# Patient Record
Sex: Female | Born: 1950
Health system: Southern US, Community
[De-identification: ages and names within clinical notes are randomized; demographics above are authoritative.]

## PROBLEM LIST (undated history)

## (undated) ENCOUNTER — Emergency Department (HOSPITAL_COMMUNITY): Payer: Medicare HMO

## (undated) DIAGNOSIS — C801 Malignant (primary) neoplasm, unspecified: Secondary | ICD-10-CM

## (undated) DIAGNOSIS — J189 Pneumonia, unspecified organism: Secondary | ICD-10-CM

## (undated) DIAGNOSIS — H269 Unspecified cataract: Secondary | ICD-10-CM

## (undated) DIAGNOSIS — J45909 Unspecified asthma, uncomplicated: Secondary | ICD-10-CM

## (undated) DIAGNOSIS — I10 Essential (primary) hypertension: Secondary | ICD-10-CM

## (undated) DIAGNOSIS — A419 Sepsis, unspecified organism: Secondary | ICD-10-CM

## (undated) DIAGNOSIS — I82409 Acute embolism and thrombosis of unspecified deep veins of unspecified lower extremity: Secondary | ICD-10-CM

## (undated) DIAGNOSIS — E119 Type 2 diabetes mellitus without complications: Secondary | ICD-10-CM

## (undated) DIAGNOSIS — G5600 Carpal tunnel syndrome, unspecified upper limb: Secondary | ICD-10-CM

## (undated) DIAGNOSIS — I2699 Other pulmonary embolism without acute cor pulmonale: Secondary | ICD-10-CM

## (undated) HISTORY — DX: Sepsis, unspecified organism: A41.9

## (undated) HISTORY — PX: INTRAOCULAR LENS INSERTION: SHX110

## (undated) HISTORY — DX: Unspecified cataract: H26.9

## (undated) HISTORY — DX: Type 2 diabetes mellitus without complications: E11.9

## (undated) HISTORY — PX: ABDOMINAL HYSTERECTOMY: SHX81

## (undated) HISTORY — DX: Acute embolism and thrombosis of unspecified deep veins of unspecified lower extremity: I82.409

## (undated) HISTORY — PX: OTHER SURGICAL HISTORY: SHX169

## (undated) HISTORY — PX: FOOT SURGERY: SHX648

---

## 2001-03-04 ENCOUNTER — Ambulatory Visit (HOSPITAL_COMMUNITY): Admission: RE | Admit: 2001-03-04 | Discharge: 2001-03-04 | Payer: Self-pay | Admitting: Internal Medicine

## 2001-03-04 ENCOUNTER — Ambulatory Visit (HOSPITAL_COMMUNITY): Admission: RE | Admit: 2001-03-04 | Discharge: 2001-03-04 | Payer: Self-pay | Admitting: Otolaryngology

## 2001-03-04 ENCOUNTER — Encounter: Payer: Self-pay | Admitting: Otolaryngology

## 2001-03-04 ENCOUNTER — Encounter: Payer: Self-pay | Admitting: Internal Medicine

## 2001-03-28 ENCOUNTER — Ambulatory Visit (HOSPITAL_COMMUNITY): Admission: RE | Admit: 2001-03-28 | Discharge: 2001-03-28 | Payer: Self-pay | Admitting: Otolaryngology

## 2001-03-28 ENCOUNTER — Encounter: Payer: Self-pay | Admitting: Otolaryngology

## 2001-09-09 ENCOUNTER — Encounter: Payer: Self-pay | Admitting: Internal Medicine

## 2001-09-09 ENCOUNTER — Ambulatory Visit (HOSPITAL_COMMUNITY): Admission: RE | Admit: 2001-09-09 | Discharge: 2001-09-09 | Payer: Self-pay | Admitting: Internal Medicine

## 2001-09-19 ENCOUNTER — Ambulatory Visit (HOSPITAL_COMMUNITY): Admission: RE | Admit: 2001-09-19 | Discharge: 2001-09-19 | Payer: Self-pay | Admitting: General Surgery

## 2001-10-03 ENCOUNTER — Ambulatory Visit (HOSPITAL_COMMUNITY): Admission: RE | Admit: 2001-10-03 | Discharge: 2001-10-03 | Payer: Self-pay | Admitting: General Surgery

## 2002-06-16 ENCOUNTER — Ambulatory Visit (HOSPITAL_COMMUNITY): Admission: RE | Admit: 2002-06-16 | Discharge: 2002-06-16 | Payer: Self-pay | Admitting: Internal Medicine

## 2002-06-16 ENCOUNTER — Encounter: Payer: Self-pay | Admitting: Internal Medicine

## 2002-06-25 ENCOUNTER — Encounter: Payer: Self-pay | Admitting: Internal Medicine

## 2002-06-25 ENCOUNTER — Ambulatory Visit (HOSPITAL_COMMUNITY): Admission: RE | Admit: 2002-06-25 | Discharge: 2002-06-25 | Payer: Self-pay | Admitting: Internal Medicine

## 2003-06-21 ENCOUNTER — Ambulatory Visit (HOSPITAL_COMMUNITY): Admission: RE | Admit: 2003-06-21 | Discharge: 2003-06-21 | Payer: Self-pay | Admitting: Internal Medicine

## 2004-06-23 ENCOUNTER — Ambulatory Visit (HOSPITAL_COMMUNITY): Admission: RE | Admit: 2004-06-23 | Discharge: 2004-06-23 | Payer: Self-pay | Admitting: Internal Medicine

## 2005-06-25 ENCOUNTER — Ambulatory Visit (HOSPITAL_COMMUNITY): Admission: RE | Admit: 2005-06-25 | Discharge: 2005-06-25 | Payer: Self-pay | Admitting: Internal Medicine

## 2006-06-06 ENCOUNTER — Ambulatory Visit (HOSPITAL_COMMUNITY): Admission: RE | Admit: 2006-06-06 | Discharge: 2006-06-06 | Payer: Self-pay | Admitting: Internal Medicine

## 2007-04-11 ENCOUNTER — Emergency Department (HOSPITAL_COMMUNITY): Admission: EM | Admit: 2007-04-11 | Discharge: 2007-04-11 | Payer: Self-pay | Admitting: Emergency Medicine

## 2007-04-30 ENCOUNTER — Ambulatory Visit (HOSPITAL_COMMUNITY): Admission: RE | Admit: 2007-04-30 | Discharge: 2007-04-30 | Payer: Self-pay | Admitting: Internal Medicine

## 2007-10-10 ENCOUNTER — Ambulatory Visit (HOSPITAL_COMMUNITY): Admission: RE | Admit: 2007-10-10 | Discharge: 2007-10-10 | Payer: Self-pay | Admitting: Internal Medicine

## 2008-08-05 ENCOUNTER — Ambulatory Visit (HOSPITAL_COMMUNITY): Admission: RE | Admit: 2008-08-05 | Discharge: 2008-08-05 | Payer: Self-pay | Admitting: Internal Medicine

## 2010-01-17 ENCOUNTER — Ambulatory Visit (HOSPITAL_COMMUNITY): Admission: RE | Admit: 2010-01-17 | Discharge: 2010-01-17 | Payer: Self-pay | Admitting: Internal Medicine

## 2010-05-04 ENCOUNTER — Emergency Department (HOSPITAL_COMMUNITY): Admission: EM | Admit: 2010-05-04 | Discharge: 2009-06-01 | Payer: Self-pay | Admitting: Emergency Medicine

## 2010-06-18 ENCOUNTER — Encounter: Payer: Self-pay | Admitting: Internal Medicine

## 2010-10-13 NOTE — Op Note (Signed)
[facility]  Patient:    Kaitlin [name], Kaitlin [name] Visit Number: [id number] MRN: [id number]          Service Type: DSU Location: DAY Attending Physician:  Dalia Heading Dictated by:   Franky Macho, M.D. Proc. Date: 09/19/01 Admit Date:  09/19/2001   CC:         [name], M.D.   Operative Report  PREOPERATIVE DIAGNOSIS:  A 1.5 cm mass, left lower extremity.  POSTOPERATIVE DIAGNOSIS:  A 1.5 cm mass, left lower extremity, sebaceous cyst.  PROCEDURE:  Excision of benign mass, 1.5 cm, left lower leg.  SURGEON:  Franky Macho, M.D.  ANESTHESIA:  MAC.  INDICATIONS:  The patient is a 60 year old black female who presents with a tender, enlarging, subcutaneous mass in the left calf region.  She now presents to the operating room for excision.  The risks and benefits of the procedure were fully explained to the patient who gave informed consent.  DESCRIPTION OF PROCEDURE:  The patient was placed in the supine position.  Th left lower extremity was prepped and draped using the usual sterile technique with Betadine.  One percent Xylocaine was used for local anesthesia.  Along the posterior aspect of the calf, an elliptical incision was made around the mass.  The mass was removed without difficulty.  There was noted to be a sebaceous cyst.  It was sent to pathology for further examination.  The skin was closed using 4-0 nylon interrupted sutures.  Betadine ointment and dry sterile dressing were applied.  All tape and needle counts were correct at the end of the procedure.  The patient was transferred to day surgery in stable condition.  There were no complications.  Specimens were sebaceous cyst, left lower extremity.  Blood loss minimal. Dictated by:   Franky Macho, M.D. Attending Physician:  Dalia Heading DD:  09/19/01 TD:  09/20/01 Job: [id number]

## 2010-10-13 NOTE — H&P (Signed)
[facility]  Patient:    TREAZURE, NERY Visit Number: [id number] MRN: [id number]          Service Type: DSU Location: DAY Attending Physician:  Dalia Heading Dictated by:   Franky Macho, M.D. Admit Date:  09/19/2001 Discharge Date: 09/19/2001   CC:         [name], M.D.   History and Physical  AGE:  60 years old.  CHIEF COMPLAINT:  Need for screening colonoscopy.  HISTORY OF PRESENT ILLNESS:  The patient is a 60 year old black female who is referred for a screening colonoscopy.  She denies any abdominal symptoms.  She denies any family history of colon carcinoma.  PAST MEDICAL HISTORY:  Past medical history includes hypertension.  PAST SURGICAL HISTORY:  Left leg mass removal recently, sinus surgery, partial hysterectomy, right breast cyst removal.  CURRENT MEDICATIONS:  Premarin, chlorthalidone, Norvasc.  ALLERGIES:  ASPIRIN.  REVIEW OF SYSTEMS:  Unremarkable.  PHYSICAL EXAMINATION:  GENERAL:  On physical examination, the patient is a well-developed, well-nourished black female in no acute distress.  VITAL SIGNS:  She is afebrile and vital signs are stable.  LUNGS:  Clear to auscultation with equal breath sounds bilaterally.  HEART:  Regular rate and rhythm without S3, S4, or murmurs.  ABDOMEN:  Unremarkable.  RECTAL:  Examination was deferred to the procedure.  IMPRESSION:  Need for screening colonoscopy.   PLAN:  The patient is scheduled for a colonoscopy on Oct 03, 2001.  The risks and benefits of the procedure including bleeding and perforation were fully explained to the patient, who gave informed consent. Dictated by:   Franky Macho, M.D. Attending Physician:  Dalia Heading DD:  09/30/01 TD:  10/01/01 Job: [id number]

## 2010-10-13 NOTE — H&P (Signed)
[facility]  Patient:    Kaitlin [name], Kaitlin [name] Visit Number: [id number] MRN: [id number]          Service Type: OUT Location: RAD Attending Physician:  [name] Dictated by:   Franky Macho, M.D. Admit Date:  09/09/2001 Discharge Date: 09/09/2001   CC:         [name], M.D.   History and Physical  DATE OF BIRTH:  03/14/1951  CHIEF COMPLAINT:  Mass, left leg.  HISTORY OF PRESENT ILLNESS:  Patient is a 60 year old black female who was referred for evaluation and treatment of a left leg knot.  It is tender to touch and seems to be enlarging in size.  No drainage has been noted.  PAST MEDICAL HISTORY:  Hypertension.  PAST SURGICAL HISTORY:  Sinus surgery, partial hysterectomy, right breast cyst removal.  CURRENT MEDICATIONS:  Premarin, chlorthalidone, Norvasc.  ALLERGIES:  ASPIRIN.  REVIEW OF SYSTEMS:  Unremarkable.  PHYSICAL EXAMINATION  GENERAL:  Patient is a well-developed, well-nourished black female in no acute distress.  VITAL SIGNS:  She is afebrile and vital signs are stable.  LUNGS:  Clear to auscultation with equal breath sounds bilaterally.  HEART:  Regular rate and rhythm without S3, S4, or murmurs.  ABDOMEN:  Benign.  EXTREMITIES:  Reveals a 1 cm mobile subcutaneous nodule noted along the posterior aspect of the left lower leg.  The leg is neurovascularly intact.  IMPRESSION:  Mass, left leg.  PLAN:  The patient is scheduled for excision of the mass, left leg, on September 19, 2001.  Risks and benefits of the procedure including bleeding, infection, and the possibility of recurrence were fully explained to the patient.  Gave informed consent. Dictated by:   Franky Macho, M.D. Attending Physician:  [name] DD:  09/16/01 TD:  09/16/01 Job: [id number]

## 2011-03-06 LAB — URINE CULTURE: Colony Count: >=100000

## 2011-03-06 LAB — URINE MICROSCOPIC-ADD ON

## 2011-03-06 LAB — URINALYSIS, ROUTINE W REFLEX MICROSCOPIC
Bilirubin Urine: NEGATIVE
Specific Gravity, Urine: 1.015
Urobilinogen, UA: 0.2

## 2012-09-03 ENCOUNTER — Inpatient Hospital Stay (HOSPITAL_COMMUNITY)
Admission: AD | Admit: 2012-09-03 | Discharge: 2012-09-07 | DRG: 277 | Disposition: A | Discharge status: Home / Self Care | Payer: BC Managed Care – PPO | Source: Ambulatory Visit | Attending: Internal Medicine | Admitting: Internal Medicine

## 2012-09-03 ENCOUNTER — Inpatient Hospital Stay (HOSPITAL_COMMUNITY): Payer: BC Managed Care – PPO

## 2012-09-03 ENCOUNTER — Other Ambulatory Visit: Payer: Self-pay | Admitting: Internal Medicine

## 2012-09-03 DIAGNOSIS — Z79899 Other long term (current) drug therapy: Secondary | ICD-10-CM

## 2012-09-03 DIAGNOSIS — Z806 Family history of leukemia: Secondary | ICD-10-CM

## 2012-09-03 DIAGNOSIS — I10 Essential (primary) hypertension: Secondary | ICD-10-CM | POA: Diagnosis present

## 2012-09-03 DIAGNOSIS — L02419 Cutaneous abscess of limb, unspecified: Principal | ICD-10-CM | POA: Diagnosis present

## 2012-09-03 DIAGNOSIS — M109 Gout, unspecified: Secondary | ICD-10-CM | POA: Diagnosis present

## 2012-09-03 DIAGNOSIS — E119 Type 2 diabetes mellitus without complications: Secondary | ICD-10-CM | POA: Diagnosis present

## 2012-09-03 DIAGNOSIS — G609 Hereditary and idiopathic neuropathy, unspecified: Secondary | ICD-10-CM | POA: Diagnosis present

## 2012-09-03 DIAGNOSIS — Z8249 Family history of ischemic heart disease and other diseases of the circulatory system: Secondary | ICD-10-CM

## 2012-09-03 DIAGNOSIS — E876 Hypokalemia: Secondary | ICD-10-CM | POA: Diagnosis present

## 2012-09-03 DIAGNOSIS — E871 Hypo-osmolality and hyponatremia: Secondary | ICD-10-CM | POA: Diagnosis present

## 2012-09-03 DIAGNOSIS — Z886 Allergy status to analgesic agent status: Secondary | ICD-10-CM

## 2012-09-03 DIAGNOSIS — J45909 Unspecified asthma, uncomplicated: Secondary | ICD-10-CM | POA: Diagnosis present

## 2012-09-03 DIAGNOSIS — Z90710 Acquired absence of both cervix and uterus: Secondary | ICD-10-CM

## 2012-09-03 HISTORY — DX: Essential (primary) hypertension: I10

## 2012-09-03 LAB — COMPREHENSIVE METABOLIC PANEL
Albumin: 3.9 g/dL (ref 3.5–5.2)
Alkaline Phosphatase: 103 U/L (ref 39–117)
BUN: 8 mg/dL (ref 6–23)
Calcium: 10.4 mg/dL (ref 8.4–10.5)
GFR calc Af Amer: 65 mL/min — ABNORMAL LOW (ref >90)
Glucose, Bld: 118 mg/dL — ABNORMAL HIGH (ref 70–99)
Potassium: 2.8 mEq/L — ABNORMAL LOW (ref 3.5–5.1)
Total Protein: 8 g/dL (ref 6.0–8.3)

## 2012-09-03 LAB — CBC
HCT: 42.6 % (ref 36.0–46.0)
MCH: 28.5 pg (ref 26.0–34.0)
MCHC: 34.5 g/dL (ref 30.0–36.0)
RDW: 12.9 % (ref 11.5–15.5)

## 2012-09-03 MED ORDER — HYDROCODONE-ACETAMINOPHEN 5-325 MG PO TABS
1.0000 | ORAL_TABLET | ORAL | Status: DC | PRN
  Administered 2012-09-05: 1 via ORAL
  Administered 2012-09-05 – 2012-09-06 (×4): 2 via ORAL
  Filled 2012-09-03 (×3): qty 2
  Filled 2012-09-03: qty 1
  Filled 2012-09-03: qty 2

## 2012-09-03 MED ORDER — ONDANSETRON HCL 4 MG/2ML IJ SOLN: 4.0000 mg | Freq: Four times a day (QID) | INTRAMUSCULAR | Status: DC | PRN

## 2012-09-03 MED ORDER — SODIUM CHLORIDE 0.9 % IJ SOLN
3.0000 mL | Freq: Two times a day (BID) | INTRAMUSCULAR | Status: DC
  Administered 2012-09-03 – 2012-09-06 (×6): 3 mL via INTRAVENOUS
  Filled 2012-09-03: qty 3

## 2012-09-03 MED ORDER — ACETAMINOPHEN 650 MG RE SUPP: 650.0000 mg | Freq: Four times a day (QID) | RECTAL | Status: DC | PRN

## 2012-09-03 MED ORDER — ONDANSETRON HCL 4 MG PO TABS: 4.0000 mg | ORAL_TABLET | Freq: Four times a day (QID) | ORAL | Status: DC | PRN

## 2012-09-03 MED ORDER — VANCOMYCIN HCL IN DEXTROSE 1-5 GM/200ML-% IV SOLN
1000.0000 mg | Freq: Two times a day (BID) | INTRAVENOUS | Status: DC
  Administered 2012-09-04 – 2012-09-05 (×4): 1000 mg via INTRAVENOUS
  Filled 2012-09-03 (×10): qty 200

## 2012-09-03 MED ORDER — PIPERACILLIN-TAZOBACTAM 3.375 G IVPB
3.3750 g | Freq: Three times a day (TID) | INTRAVENOUS | Status: DC
  Administered 2012-09-03 – 2012-09-07 (×12): 3.375 g via INTRAVENOUS
  Filled 2012-09-03 (×16): qty 50

## 2012-09-03 MED ORDER — VANCOMYCIN HCL 10 G IV SOLR
1500.0000 mg | Freq: Once | INTRAVENOUS | Status: AC
  Administered 2012-09-03: 1500 mg via INTRAVENOUS
  Filled 2012-09-03: qty 1500

## 2012-09-03 MED ORDER — ACETAMINOPHEN 325 MG PO TABS: 650.0000 mg | ORAL_TABLET | Freq: Four times a day (QID) | ORAL | Status: DC | PRN

## 2012-09-03 MED ORDER — ENOXAPARIN SODIUM 40 MG/0.4ML ~~LOC~~ SOLN
40.0000 mg | SUBCUTANEOUS | Status: DC
  Administered 2012-09-03 – 2012-09-07 (×5): 40 mg via SUBCUTANEOUS
  Filled 2012-09-03 (×5): qty 0.4

## 2012-09-03 MED ORDER — ALUM & MAG HYDROXIDE-SIMETH 200-200-20 MG/5ML PO SUSP: 30.0000 mL | Freq: Four times a day (QID) | ORAL | Status: DC | PRN

## 2012-09-03 MED ORDER — HYDROMORPHONE HCL PF 1 MG/ML IJ SOLN
1.0000 mg | INTRAMUSCULAR | Status: DC | PRN
  Administered 2012-09-03 – 2012-09-05 (×5): 1 mg via INTRAVENOUS
  Filled 2012-09-03 (×5): qty 1

## 2012-09-03 MED ORDER — SODIUM CHLORIDE 0.9 % IV SOLN: 250.0000 mL | INTRAVENOUS | Status: DC | PRN

## 2012-09-03 MED ORDER — AMLODIPINE BESYLATE 5 MG PO TABS
5.0000 mg | ORAL_TABLET | Freq: Every day | ORAL | Status: DC
  Administered 2012-09-03 – 2012-09-07 (×5): 5 mg via ORAL
  Filled 2012-09-03 (×5): qty 1

## 2012-09-03 MED ORDER — SODIUM CHLORIDE 0.9 % IJ SOLN: 3.0000 mL | INTRAMUSCULAR | Status: DC | PRN

## 2012-09-03 MED ORDER — POTASSIUM CHLORIDE CRYS ER 20 MEQ PO TBCR
20.0000 meq | EXTENDED_RELEASE_TABLET | Freq: Three times a day (TID) | ORAL | Status: DC
  Administered 2012-09-03 (×2): 20 meq via ORAL
  Filled 2012-09-03 (×2): qty 1

## 2012-09-03 NOTE — Progress Notes (Signed)
ANTIBIOTIC CONSULT NOTE - INITIAL  Pharmacy Consult for Vancomycin Indication: cellulitis  Allergies  Allergen Reactions  . Banana Anaphylaxis  . Aspirin Other (See Comments)    G.I. Upset    Patient Measurements: Height: 5\' 7"  (170.2 cm) Weight: 215 lb 11.2 oz (97.841 kg) IBW/kg (Calculated) : 61.6  Vital Signs: BP: 146/78 mmHg (04/09 1150) Pulse Rate: 93 (04/09 1150) Intake/Output from previous day:   Intake/Output from this shift: Total I/O In: 120 [P.O.:120] Out: -   Labs:  Recent Labs  09/03/12 1155  WBC 11.9*  HGB 14.7  PLT 269  CREATININE 1.05   Estimated Creatinine Clearance: 67.6 ml/min (by C-G formula based on Cr of 1.05). No results found for this basename: VANCOTROUGH, VANCOPEAK, VANCORANDOM, GENTTROUGH, GENTPEAK, GENTRANDOM, TOBRATROUGH, TOBRAPEAK, TOBRARND, AMIKACINPEAK, AMIKACINTROU, AMIKACIN,  in the last 72 hours   Microbiology: No results found for this or any previous visit (from the past 720 hour(s)).  Medical History: No past medical history on file.  Medications:  Scheduled:  . amLODipine  5 mg Oral Daily  . enoxaparin (LOVENOX) injection  40 mg Subcutaneous Q24H  . piperacillin-tazobactam (ZOSYN)  IV  3.375 g Intravenous Q8H  . potassium chloride  20 mEq Oral TID  . sodium chloride  3 mL Intravenous Q12H  . vancomycin  1,500 mg Intravenous Once  . [START ON 09/04/2012] vancomycin  1,000 mg Intravenous Q12H   Assessment: 62yo female admitted with cellulitis.  Pt is obese with good renal fxn.  Estimated Creatinine Clearance: 67.6 ml/min (by C-G formula based on Cr of 1.05).  No MD notes are available at this time.  No medical history is available at this time.  Goal of Therapy:  Vancomycin trough level 10-15 mcg/ml  Plan: Vancomycin 1gm IV q12hrs Check trough at steady state Continue Zosyn 3.375gm IV q8hrs per MD Monitor labs, renal fxn, and cultures per protocol Duration of therapy per MD  Valrie Hart A 09/03/2012,1:23 PM

## 2012-09-04 ENCOUNTER — Encounter (HOSPITAL_COMMUNITY): Payer: Self-pay | Admitting: *Deleted

## 2012-09-04 LAB — BASIC METABOLIC PANEL
CO2: 29 mEq/L (ref 19–32)
Calcium: 9.1 mg/dL (ref 8.4–10.5)
Chloride: 94 mEq/L — ABNORMAL LOW (ref 96–112)
Glucose, Bld: 121 mg/dL — ABNORMAL HIGH (ref 70–99)
Potassium: 2.9 mEq/L — ABNORMAL LOW (ref 3.5–5.1)
Sodium: 132 mEq/L — ABNORMAL LOW (ref 135–145)

## 2012-09-04 MED ORDER — POTASSIUM CHLORIDE CRYS ER 20 MEQ PO TBCR
40.0000 meq | EXTENDED_RELEASE_TABLET | Freq: Three times a day (TID) | ORAL | Status: AC
  Administered 2012-09-04 – 2012-09-05 (×4): 40 meq via ORAL
  Filled 2012-09-04 (×4): qty 2

## 2012-09-04 NOTE — Progress Notes (Signed)
NAMEKARL, KNARR                  ACCOUNT NO.:  [id number]  MEDICAL RECORD NO.:  [id number]  LOCATION:                                 FACILITY:  PHYSICIAN:  Kingsley Callander. Ouida Sills, MD       DATE OF BIRTH:  1951/01/28  DATE OF PROCEDURE:  09/04/2012 DATE OF DISCHARGE:                                PROGRESS NOTE   SUBJECTIVE:  She has had no difficulties overnight.  Her level of pain with weightbearing is unchanged.  OBJECTIVE:  VITAL SIGNS:  Her maximum temperature has been 99.  She is afebrile this morning with a temperature of 98.3, pulse is 70, blood pressure is 114/69. LUNGS:  Clear. HEART:  Regular with no murmurs. ABDOMEN:  Soft and nontender. EXTREMITIES:  Slight improvement in the redness and swelling in the right lateral ankle.  There has been some drainage on to the dressing posteriorly.  IMPRESSION/PLAN: 1. Right ankle cellulitis.  Continue vancomycin and Zosyn. 2. Hypokalemia.  Serum potassium has improved slightly to 2.9.  We     will increase her potassium supplementation. 3. Impaired fasting glucose.  This morning's glucose is 121 after it     was 118 on admission yesterday. 4. Hypertension controlled with amlodipine.     Kingsley Callander. Ouida Sills, MD     ROF/MEDQ  D:  09/04/2012  T:  09/04/2012  Job:  [id number]

## 2012-09-04 NOTE — Progress Notes (Signed)
UR- No H&P available- UR delayed

## 2012-09-05 LAB — HEMOGLOBIN A1C
Hgb A1c MFr Bld: 7.1 % — ABNORMAL HIGH (ref <5.7)
Mean Plasma Glucose: 157 mg/dL — ABNORMAL HIGH (ref <117)

## 2012-09-05 LAB — BASIC METABOLIC PANEL
Chloride: 100 mEq/L (ref 96–112)
Creatinine, Ser: 0.97 mg/dL (ref 0.50–1.10)
GFR calc Af Amer: 72 mL/min — ABNORMAL LOW (ref >90)
GFR calc non Af Amer: 62 mL/min — ABNORMAL LOW (ref >90)
Potassium: 4 mEq/L (ref 3.5–5.1)

## 2012-09-05 LAB — CBC
HCT: 37.2 % (ref 36.0–46.0)
Hemoglobin: 12.5 g/dL (ref 12.0–15.0)
RDW: 13 % (ref 11.5–15.5)
WBC: 4.7 10*3/uL (ref 4.0–10.5)

## 2012-09-05 LAB — VANCOMYCIN, TROUGH: Vancomycin Tr: 9.1 ug/mL — ABNORMAL LOW (ref 10.0–20.0)

## 2012-09-05 MED ORDER — VANCOMYCIN HCL 10 G IV SOLR
1250.0000 mg | Freq: Two times a day (BID) | INTRAVENOUS | Status: DC
  Administered 2012-09-06 – 2012-09-07 (×4): 1250 mg via INTRAVENOUS
  Filled 2012-09-05 (×6): qty 1250

## 2012-09-05 NOTE — Progress Notes (Signed)
ANTIBIOTIC CONSULT NOTE  Pharmacy Consult for Vancomycin Indication: cellulitis  Allergies  Allergen Reactions  . Banana Anaphylaxis  . Aspirin Other (See Comments)    G.I. Upset    Patient Measurements: Height: 5\' 7"  (170.2 cm) Weight: 215 lb 11.2 oz (97.841 kg) IBW/kg (Calculated) : 61.6  Vital Signs: Temp: 97.4 F (36.3 C) (04/11 1407) Temp src: Oral (04/11 1407) BP: 96/60 mmHg (04/11 1407) Pulse Rate: 73 (04/11 1407) Intake/Output from previous day: 04/10 0701 - 04/11 0700 In: 1150 [P.O.:600; IV Piggyback:550] Out: 850 [Urine:850] Intake/Output from this shift:    Labs:  Recent Labs  09/03/12 1155 09/04/12 0457 09/05/12 0511  WBC 11.9*  --  4.7  HGB 14.7  --  12.5  PLT 269  --  229  CREATININE 1.05 1.04 0.97   Estimated Creatinine Clearance: 73.2 ml/min (by C-G formula based on Cr of 0.97).  Recent Labs  09/05/12 1327  VANCOTROUGH 9.1*    Microbiology: No results found for this or any previous visit (from the past 720 hour(s)).  Medical History: Past Medical History  Diagnosis Date  . Hypertension    Medications:  Scheduled:  . amLODipine  5 mg Oral Daily  . enoxaparin (LOVENOX) injection  40 mg Subcutaneous Q24H  . piperacillin-tazobactam (ZOSYN)  IV  3.375 g Intravenous Q8H  . [COMPLETED] potassium chloride  40 mEq Oral TID  . sodium chloride  3 mL Intravenous Q12H  . [START ON 09/06/2012] vancomycin  1,250 mg Intravenous Q12H  . [DISCONTINUED] vancomycin  1,000 mg Intravenous Q12H   Assessment: 62yo female admitted with cellulitis.  Pt is obese with good renal fxn.  Estimated Creatinine Clearance: 73.2 ml/min (by C-G formula based on Cr of 0.97).   Trough level is below goal.  Goal of Therapy:  Vancomycin trough level 10-15 mcg/ml  Plan: Increase Vancomycin to 1250mg  IV q12hrs Check trough weekly Continue Zosyn 3.375gm IV q8hrs per MD Monitor labs, renal fxn, and cultures per protocol Duration of therapy per MD  Valrie Hart  A 09/05/2012,3:32 PM

## 2012-09-05 NOTE — H&P (Signed)
NAME:  [name], Kaitlin                     ACCOUNT NO.:  MEDICAL RECORD NO.:  LOCATION:                                 FACILITY:  PHYSICIAN:  Kingsley Callander. Ouida Sills, MD            DATE OF BIRTH:  DATE OF ADMISSION:  09/03/2012 DATE OF DISCHARGE:  LH                             HISTORY & PHYSICAL   CHIEF COMPLAINT:  Right ankle pain.  HISTORY OF PRESENT ILLNESS:  This patient is a 62 year old African American female, who presented 2 days prior to admission, with redness, swelling, and tenderness in the right lateral ankle after suffering a puncture wound from a piece of wood 2 days earlier.  She was started on Bactrim and Cipro and followed up 2 days later.  On the day of admission, she had increased pain.  There was persistent redness and swelling.  She was afebrile.  She has a history of prediabetes.  She was hospitalized for treatment with IV antibiotics.  PAST MEDICAL HISTORY: 1. Prediabetes. 2. Asthma. 3. Hypertension. 4. Peripheral neuropathy. 5. Gout. 6. Hysterectomy for fibroid in 1989. 7. Excision of fatty tumor from the right breast.  MEDICATIONS: 1. Amlodipine 5 mg daily. 2. Chlorthalidone 25 mg daily. 3. Albuterol metered-dose inhaler p.r.n. 4. Cipro 500 mg b.i.d. 5. Bactrim DS b.i.d.  ALLERGIES:  Aspirin.  SOCIAL HISTORY:  She does not smoke, drink, or use drugs.  FAMILY HISTORY:  Her mother died of an MI at 26 and had diabetes.  Her father died of leukemia at 3.  Her sister has had hypertension and gout.  Another sister has had hypertension.  REVIEW OF SYSTEMS:  Noncontributory.  PHYSICAL EXAMINATION:  VITAL SIGNS:  Afebrile. GENERAL:  Alert, in no distress. HEENT:  Eyes, nose, and oropharynx are unremarkable. NECK:  Supple with no JVD or thyromegaly. LUNGS:  Clear. HEART:  Regular with no murmurs. ABDOMEN:  Soft, nondistended, nontender with no palpable organomegaly. EXTREMITIES:  There is redness, swelling, and increased warmth in the right lateral  ankle.  There is a 0.5-cm open area of the skin with some mild serous drainage.  No purulent drainage.  No palpable fluctuance. Distal pulses are intact.  The left foot and leg are normal. NEUROLOGIC:  No focal deficits. LYMPH NODES:  No cervical or supraclavicular enlargement.  LABORATORY DATA:  White count 11.9, glucose 118, potassium 2.8.  IMAGING STUDIES:  X-rays revealed no bony involvement.  IMPRESSION AND PLAN: 1. Cellulitis of the right ankle area.  Treat with IV vancomycin and     IV Zosyn. 2. Hypokalemia.  Replace orally. 3. Hypertension.  Continue amlodipine.  We will hold chlorthalidone     for now. 4. Asthma, stable. 5. Peripheral neuropathy. 6. Prediabetes.     Kingsley Callander. Ouida Sills, MD     ROF/MEDQ  D:  09/04/2012  T:  09/04/2012  Job:  [id number]

## 2012-09-05 NOTE — Care Management Note (Signed)
    Page 1 of 1   09/05/2012     2:33:09 PM   CARE MANAGEMENT NOTE 09/05/2012  Patient:  Kaitlin [name], Kaitlin [name]   Account Number:  [id number]  Date Initiated:  09/05/2012  Documentation initiated by:  Sharrie Rothman  Subjective/Objective Assessment:   Pt admitted from home with cellulitis. Pt lives with her daughter and will return home at discharge. Pt is independent with ADL's.     Action/Plan:   No CM needs noted.   Anticipated DC Date:  09/08/2012   Anticipated DC Plan:  HOME/SELF CARE      DC Planning Services  CM consult      Choice offered to / List presented to:             Status of service:  Completed, signed off Medicare Important Message given?   (If response is "NO", the following Medicare IM given date fields will be blank) Date Medicare IM given:   Date Additional Medicare IM given:    Discharge Disposition:  HOME/SELF CARE  Per UR Regulation:    If discussed at Long Length of Stay Meetings, dates discussed:    Comments:  09/06/10 1430 Arlyss Queen, RN BSN CM

## 2012-09-05 NOTE — Progress Notes (Signed)
UR Chart Review Completed  

## 2012-09-06 NOTE — Progress Notes (Signed)
Kaitlin [name], Kaitlin [name]                  ACCOUNT NO.:  [id number]  MEDICAL RECORD NO.:  [id number]  LOCATION:  A336                          FACILITY:  [facility]  PHYSICIAN:  Tansy Lorek G. Renard Matter, MD   DATE OF BIRTH:  1950/07/12  DATE OF PROCEDURE: DATE OF DISCHARGE:                                PROGRESS NOTE   This patient is alert and oriented this morning.  Still has some discomfort.  Over the right ankle, she has been treated for cellulitis of right ankle.  She did have hypokalemia, which had been treated.  OBJECTIVE:  VITAL SIGNS:  Blood pressure 120/90 respirations 18, pulse 78, temp 97.7. LUNGS:  Clear to P and A. HEART:  Regular rhythm. ABDOMEN:  No palpable organs or masses.  EXTREMITIES:  The patient does have continued redness and swelling on right ankle.  ASSESSMENT:  Cellulitis, right ankle; hypokalemia; hypertension.  PLAN:  To continue current regimen.  Her potassium is now 4.0.     Devansh Riese G. Renard Matter, MD     AGM/MEDQ  D:  09/06/2012  T:  09/06/2012  Job:  [id number]

## 2012-09-07 NOTE — Progress Notes (Signed)
Pt. D/c instructions reviewed, IV removed, prescriptions reviewed, and DM education given.

## 2012-09-08 NOTE — Discharge Summary (Signed)
NAMEJANILLE, [name]                  ACCOUNT NO.:  [id number]  MEDICAL RECORD NO.:  [id number]  LOCATION:                                 FACILITY:  PHYSICIAN:  Kingsley Callander. Ouida Sills, MD       DATE OF BIRTH:  04/02/1951  DATE OF ADMISSION:  09/03/2012 DATE OF DISCHARGE:  04/13/2014LH                              DISCHARGE SUMMARY   DISCHARGE DIAGNOSES: 1. Right ankle cellulitis. 2. Type 2 diabetes. 3. Hypertension. 4. Hypokalemia.  DISCHARGE MEDICATIONS: 1. Cipro 500 mg b.i.d. 2. Bactrim DS b.i.d. 3. Amlodipine 5 mg daily. 4. Albuterol 2 puffs q.6 p.r.n. 5. Norco 5/325 q.4 p.r.n. and q.6 p.r.n. 6. Multivitamin daily. 7. Chlorthalidone is being held.  HOSPITAL COURSE:  This patient is a 62 year old female who presented with pain, redness, and, swelling in her right ankle consistent with cellulitis.  She had been started on Cipro and Bactrim 2 days prior to admission and had failed to improve.  She was hospitalized and treated with IV vancomycin and IV Zosyn.  She has had significant improvement during her hospital course.  She has some residual tenderness and mild swelling, but no remaining erythema.  The open wound has closed over now has a scab.  X-rays revealed no evidence of osteomyelitis or evidence of foreign body.  She will be converted back to Bactrim DS and Cipro orally and will be seen in followup in my office in 5 days.  She has hydrocodone as needed for pain.  She has a history of prediabetes and had mildly elevated glucoses.  A hemoglobin A1c was obtained and revealed a value of 7.1, consistent with now having type 2 diabetes.  She has been counseled regarding dietary modification.  She will have a followup hemoglobin A1c in 3 months.  Hypertension has been treated with amlodipine, chlorthalidone has been held for now.  CONDITION AT DISCHARGE:  Much improved.     Kingsley Callander. Ouida Sills, MD     ROF/MEDQ  D:  09/07/2012  T:  09/08/2012  Job:  [id number]

## 2012-09-08 NOTE — Progress Notes (Signed)
Kaitlin [name], Kaitlin [name]                  ACCOUNT NO.:  [id number]  MEDICAL RECORD NO.:  [id number]  LOCATION:                                 FACILITY:  PHYSICIAN:  Kingsley Callander. Ouida Sills, MD       DATE OF BIRTH:  04-14-1951  DATE OF PROCEDURE:  09/05/2012 DATE OF DISCHARGE:  09/07/2012                                PROGRESS NOTE   SUBJECTIVE:  Her right ankle is feeling better.  She still has significant pain with weight bearing.  She has had no fever.  OBJECTIVE:  Temperature 97.9, pulse 71, blood pressure 115/50.  The right ankle and foot appear less swollen and less red.  There is still medial and lateral tenderness in the ankle.  The posterior open area is now scabbed and is not draining this morning.  IMPRESSION AND PLAN: 1. Right ankle cellulitis, improving.  Her white count is dropped from     11.9 to 4.7.  The remainder of her CBC is normal.  Continue     vancomycin and Zosyn. 2. Hypokalemia, resolved.  Serum potassium is normalized from 2.9 to     4.0. 3. Hyponatremia.  Serum sodium is normalized from 132 to 137. 4. Impaired fasting glucose.  Glucose this morning is 137.     Kingsley Callander. Ouida Sills, MD     ROF/MEDQ  D:  09/05/2012  T:  09/05/2012  Job:  [id number]

## 2012-09-25 ENCOUNTER — Ambulatory Visit (HOSPITAL_COMMUNITY)
Admission: RE | Admit: 2012-09-25 | Discharge: 2012-09-25 | Disposition: A | Discharge status: Home / Self Care | Payer: BC Managed Care – PPO | Source: Ambulatory Visit | Attending: Internal Medicine | Admitting: Internal Medicine

## 2012-09-25 ENCOUNTER — Other Ambulatory Visit (HOSPITAL_COMMUNITY): Payer: Self-pay | Admitting: Internal Medicine

## 2012-09-25 DIAGNOSIS — L0390 Cellulitis, unspecified: Secondary | ICD-10-CM

## 2012-09-25 DIAGNOSIS — R937 Abnormal findings on diagnostic imaging of other parts of musculoskeletal system: Secondary | ICD-10-CM | POA: Insufficient documentation

## 2012-09-25 DIAGNOSIS — Y929 Unspecified place or not applicable: Secondary | ICD-10-CM | POA: Insufficient documentation

## 2012-09-25 DIAGNOSIS — S70259A Superficial foreign body, unspecified hip, initial encounter: Secondary | ICD-10-CM | POA: Insufficient documentation

## 2012-09-25 DIAGNOSIS — IMO0002 Reserved for concepts with insufficient information to code with codable children: Secondary | ICD-10-CM | POA: Insufficient documentation

## 2012-09-25 DIAGNOSIS — M25579 Pain in unspecified ankle and joints of unspecified foot: Secondary | ICD-10-CM | POA: Insufficient documentation

## 2012-11-21 ENCOUNTER — Emergency Department (HOSPITAL_COMMUNITY)
Admission: EM | Admit: 2012-11-21 | Discharge: 2012-11-21 | Disposition: A | Discharge status: Home / Self Care | Payer: BC Managed Care – PPO | Attending: Emergency Medicine | Admitting: Emergency Medicine

## 2012-11-21 ENCOUNTER — Encounter (HOSPITAL_COMMUNITY): Payer: Self-pay | Admitting: *Deleted

## 2012-11-21 DIAGNOSIS — I10 Essential (primary) hypertension: Secondary | ICD-10-CM | POA: Insufficient documentation

## 2012-11-21 DIAGNOSIS — Z79899 Other long term (current) drug therapy: Secondary | ICD-10-CM | POA: Insufficient documentation

## 2012-11-21 DIAGNOSIS — M436 Torticollis: Secondary | ICD-10-CM

## 2012-11-21 DIAGNOSIS — R51 Headache: Secondary | ICD-10-CM | POA: Insufficient documentation

## 2012-11-21 DIAGNOSIS — Z8669 Personal history of other diseases of the nervous system and sense organs: Secondary | ICD-10-CM | POA: Insufficient documentation

## 2012-11-21 HISTORY — DX: Carpal tunnel syndrome, unspecified upper limb: G56.00

## 2012-11-21 MED ORDER — PREDNISONE 50 MG PO TABS
60.0000 mg | ORAL_TABLET | Freq: Once | ORAL | Status: AC
  Administered 2012-11-21: 60 mg via ORAL
  Filled 2012-11-21: qty 1

## 2012-11-21 MED ORDER — OXYCODONE-ACETAMINOPHEN 5-325 MG PO TABS: 1.0000 | ORAL_TABLET | Freq: Four times a day (QID) | ORAL | Status: DC | PRN

## 2012-11-21 MED ORDER — PREDNISONE 10 MG PO TABS: ORAL_TABLET | ORAL | Status: DC

## 2012-11-21 MED ORDER — OXYCODONE-ACETAMINOPHEN 5-325 MG PO TABS
2.0000 | ORAL_TABLET | Freq: Once | ORAL | Status: AC
  Administered 2012-11-21: 2 via ORAL
  Filled 2012-11-21: qty 2

## 2012-11-21 MED ORDER — METHOCARBAMOL 500 MG PO TABS
1000.0000 mg | ORAL_TABLET | Freq: Once | ORAL | Status: AC
  Administered 2012-11-21: 1000 mg via ORAL
  Filled 2012-11-21: qty 2

## 2012-11-21 MED ORDER — HYDROMORPHONE HCL PF 1 MG/ML IJ SOLN
1.0000 mg | Freq: Once | INTRAMUSCULAR | Status: AC
  Administered 2012-11-21: 1 mg via INTRAMUSCULAR
  Filled 2012-11-21: qty 1

## 2012-11-21 NOTE — ED Provider Notes (Signed)
History    CSN: [id number] Arrival date & time 11/21/12  1632  First MD Initiated Contact with Patient 11/21/12 1646     Chief Complaint  Patient presents with  . Neck Pain   (Consider location/radiation/quality/duration/timing/severity/associated sxs/prior Treatment) Patient is a 62 y.o. female presenting with neck pain. The history is provided by the patient.  Neck Pain Pain location:  L side Quality:  Cramping Pain radiates to:  L scapula Pain severity:  Severe Pain is:  Same all the time Onset quality:  Sudden Duration:  3 days Timing:  Constant Progression:  Worsening Chronicity:  New Context comment:  Woke up with pain in the left neck. Relieved by:  Nothing Exacerbated by: movement. Associated symptoms: headaches   Associated symptoms: no bladder incontinence, no bowel incontinence, no chest pain, no numbness and no photophobia   Risk factors: no hx of spinal trauma    Past Medical History  Diagnosis Date  . Hypertension   . Carpal tunnel syndrome    Past Surgical History  Procedure Laterality Date  . Foot surgery    . Abdominal hysterectomy     History reviewed. No pertinent family history. History  Substance Use Topics  . Smoking status: Never Smoker   . Smokeless tobacco: Not on file  . Alcohol Use: Yes   OB History   Grav Para Term Preterm Abortions TAB SAB Ect Mult Living                 Review of Systems  Constitutional: Negative for activity change.       All ROS Neg except as noted in HPI  HENT: Positive for neck pain. Negative for nosebleeds.   Eyes: Negative for photophobia and discharge.  Respiratory: Negative for cough, shortness of breath and wheezing.   Cardiovascular: Negative for chest pain and palpitations.  Gastrointestinal: Negative for abdominal pain, blood in stool and bowel incontinence.  Genitourinary: Negative for bladder incontinence, dysuria, frequency and hematuria.  Musculoskeletal: Negative for back pain and  arthralgias.  Skin: Negative.   Neurological: Positive for headaches. Negative for dizziness, seizures, speech difficulty and numbness.  Psychiatric/Behavioral: Negative for hallucinations and confusion.    Allergies  Banana and Aspirin  Home Medications   Current Outpatient Rx  Name  Route  Sig  Dispense  Refill  . albuterol (PROVENTIL HFA;VENTOLIN HFA) 108 (90 BASE) MCG/ACT inhaler   Inhalation   Inhale 2 puffs into the lungs every 6 (six) hours as needed for wheezing.         Marland Kitchen amLODipine (NORVASC) 5 MG tablet   Oral   Take 5 mg by mouth daily.         . ciprofloxacin (CIPRO) 500 MG tablet   Oral   Take 500 mg by mouth 2 (two) times daily.         Marland Kitchen HYDROcodone-acetaminophen (NORCO/VICODIN) 5-325 MG per tablet   Oral   Take 1 tablet by mouth every 6 (six) hours as needed for pain.         . Multiple Vitamin (MULTIVITAMIN WITH MINERALS) TABS   Oral   Take 1 tablet by mouth daily.         Marland Kitchen sulfamethoxazole-trimethoprim (BACTRIM DS) 800-160 MG per tablet   Oral   Take 1 tablet by mouth 2 (two) times daily.          BP 137/83  Pulse 92  Temp(Src) 98 F (36.7 C) (Oral)  Resp 18  Ht 5\' 7"  (1.702 m)  Wt 219 lb (99.338 kg)  BMI 34.29 kg/m2  SpO2 100% Physical Exam  Nursing note and vitals reviewed. Constitutional: She is oriented to person, place, and time. She appears well-developed and well-nourished.  Non-toxic appearance.  HENT:  Head: Normocephalic.  Right Ear: Tympanic membrane and external ear normal.  Left Ear: Tympanic membrane and external ear normal.  Eyes: EOM and lids are normal. Pupils are equal, round, and reactive to light.  Neck: Carotid bruit is not present.  Pain to palpation and movement of the neck on the left. No hot areas. No palpable step off. No bruit.  Cardiovascular: Normal rate, regular rhythm, normal heart sounds, intact distal pulses and normal pulses.   Pulmonary/Chest: Breath sounds normal. No respiratory distress.   Abdominal: Soft. Bowel sounds are normal. There is no tenderness. There is no guarding.  Musculoskeletal: Normal range of motion.  Lymphadenopathy:       Head (right side): No submandibular adenopathy present.       Head (left side): No submandibular adenopathy present.    She has no cervical adenopathy.  Neurological: She is alert and oriented to person, place, and time. She has normal strength. No cranial nerve deficit or sensory deficit.  Skin: Skin is warm and dry.  Psychiatric: She has a normal mood and affect. Her speech is normal.    ED Course  Procedures (including critical care time) Labs Reviewed - No data to display No results found. No diagnosis found.  MDM  I have reviewed nursing notes, vital signs, and all appropriate lab and imaging results for this patient. Pt's exam is c/w torticolis. Pt treated with percocet and prednisone. Pt states she has valium. Pt to see Dr Hilda Lias for evaluation if not improving, or return to the ED.  Kathie Dike, PA-C 11/21/12 1706

## 2012-11-21 NOTE — ED Notes (Signed)
Additional meds given for pain, denies any relief

## 2012-11-21 NOTE — ED Provider Notes (Signed)
Medical screening examination/treatment/procedure(s) were performed by non-physician practitioner and as supervising physician I was immediately available for consultation/collaboration.  Donnetta Hutching, MD 11/21/12 1904

## 2012-11-21 NOTE — ED Notes (Signed)
Lt side of neck hurts for 4 days, no known injury . Increased pain with movement.

## 2014-03-04 ENCOUNTER — Other Ambulatory Visit (HOSPITAL_COMMUNITY): Payer: BC Managed Care – PPO

## 2014-03-09 ENCOUNTER — Encounter (HOSPITAL_COMMUNITY): Admission: RE | Payer: Self-pay | Source: Ambulatory Visit

## 2014-03-09 ENCOUNTER — Ambulatory Visit (HOSPITAL_COMMUNITY): Admission: RE | Admit: 2014-03-09 | Payer: BC Managed Care – PPO | Source: Ambulatory Visit | Admitting: Ophthalmology

## 2014-03-09 SURGERY — PHACOEMULSIFICATION, CATARACT, WITH IOL INSERTION
Anesthesia: Monitor Anesthesia Care | Laterality: Right

## 2014-03-16 ENCOUNTER — Other Ambulatory Visit (HOSPITAL_COMMUNITY): Payer: BC Managed Care – PPO

## 2014-03-23 ENCOUNTER — Encounter (HOSPITAL_COMMUNITY): Admission: RE | Payer: Self-pay | Source: Ambulatory Visit

## 2014-03-23 ENCOUNTER — Ambulatory Visit (HOSPITAL_COMMUNITY): Admission: RE | Admit: 2014-03-23 | Payer: BC Managed Care – PPO | Source: Ambulatory Visit | Admitting: Ophthalmology

## 2014-03-23 SURGERY — PHACOEMULSIFICATION, CATARACT, WITH IOL INSERTION
Anesthesia: Monitor Anesthesia Care | Laterality: Left

## 2015-02-10 ENCOUNTER — Encounter (HOSPITAL_COMMUNITY): Payer: Self-pay | Admitting: Emergency Medicine

## 2015-02-10 ENCOUNTER — Emergency Department (HOSPITAL_COMMUNITY)
Admission: EM | Admit: 2015-02-10 | Discharge: 2015-02-11 | Disposition: A | Discharge status: Home / Self Care | Payer: BLUE CROSS/BLUE SHIELD | Attending: Emergency Medicine | Admitting: Emergency Medicine

## 2015-02-10 DIAGNOSIS — I10 Essential (primary) hypertension: Secondary | ICD-10-CM

## 2015-02-10 DIAGNOSIS — H538 Other visual disturbances: Secondary | ICD-10-CM | POA: Diagnosis present

## 2015-02-10 DIAGNOSIS — H04301 Unspecified dacryocystitis of right lacrimal passage: Secondary | ICD-10-CM | POA: Diagnosis not present

## 2015-02-10 DIAGNOSIS — E876 Hypokalemia: Secondary | ICD-10-CM | POA: Diagnosis not present

## 2015-02-10 DIAGNOSIS — J45909 Unspecified asthma, uncomplicated: Secondary | ICD-10-CM | POA: Insufficient documentation

## 2015-02-10 DIAGNOSIS — Z792 Long term (current) use of antibiotics: Secondary | ICD-10-CM | POA: Insufficient documentation

## 2015-02-10 DIAGNOSIS — H9201 Otalgia, right ear: Secondary | ICD-10-CM | POA: Insufficient documentation

## 2015-02-10 DIAGNOSIS — Z8669 Personal history of other diseases of the nervous system and sense organs: Secondary | ICD-10-CM | POA: Insufficient documentation

## 2015-02-10 DIAGNOSIS — Z79899 Other long term (current) drug therapy: Secondary | ICD-10-CM | POA: Diagnosis not present

## 2015-02-10 HISTORY — DX: Unspecified asthma, uncomplicated: J45.909

## 2015-02-10 LAB — CBC WITH DIFFERENTIAL/PLATELET
BASOS PCT: 1 %
Basophils Absolute: 0.1 10*3/uL (ref 0.0–0.1)
EOS ABS: 0.5 10*3/uL (ref 0.0–0.7)
Eosinophils Relative: 6 %
HCT: 42.5 % (ref 36.0–46.0)
Hemoglobin: 14 g/dL (ref 12.0–15.0)
Lymphocytes Relative: 36 %
Lymphs Abs: 3.2 10*3/uL (ref 0.7–4.0)
MCH: 28.1 pg (ref 26.0–34.0)
MCHC: 32.9 g/dL (ref 30.0–36.0)
MCV: 85.3 fL (ref 78.0–100.0)
MONO ABS: 0.7 10*3/uL (ref 0.1–1.0)
MONOS PCT: 9 %
Neutro Abs: 4.3 10*3/uL (ref 1.7–7.7)
Neutrophils Relative %: 48 %
Platelets: 267 10*3/uL (ref 150–400)
RBC: 4.98 MIL/uL (ref 3.87–5.11)
RDW: 13.6 % (ref 11.5–15.5)
WBC: 8.7 10*3/uL (ref 4.0–10.5)

## 2015-02-10 NOTE — ED Notes (Signed)
Pt reports sinus pressure and feeling like her throat is swollen.

## 2015-02-10 NOTE — ED Provider Notes (Signed)
CSN: [id number]     Arrival date & time 02/10/15  2201 History   This chart was scribed for Rolland Porter, MD by Terressa Koyanagi, ED Scribe. This patient was seen in room APA07/APA07 and the patient's care was started at 12:51 AM.  Chief Complaint  Patient presents with  . Recurrent Sinusitis  . Hypertension   HPI PCP: [name], MD  Opthalmology: Dr. Gertie Fey   HPI Comments: Kaitlin [name] is a 64 y.o. female, with PMHx noted below including glaucoma and cataracts, who presents to the Emergency Department complaining of blurred vision of right eye with associated tearing and drainage from right eye, sinus pressure and rhinorrhea onset today.Pt denies: fever, tobacco use, chest pain, SOB, headache, n/v, or any other Sx at this time. Pt denies taking any measure at home to alleviate his Sx. patient states about 1 PM today she started getting some swelling and pressure around her right eye that has gotten progressively worse. She reports she had a infection in her left eye last week that was treated with eyedrops. She also complains of right ear pain.  Patient has a history of hypertension. She ran out of her hydrochlorothiazide over a month ago. She has not taken her losartan 100 mg prescribed once a day since October 2015 although her bottle has 6 refills on it. She denies headache, chest pain, shortness of breath, nausea or vomiting.  PCP Dr Willey Blade Ophthalmology Dr Valetta Close  appt on 27th  Past Medical History  Diagnosis Date  . Hypertension   . Carpal tunnel syndrome   . Asthma    Past Surgical History  Procedure Laterality Date  . Foot surgery    . Abdominal hysterectomy     Family History  Problem Relation Age of Onset  . Heart failure Mother   . Stroke Mother   . Kidney failure Other    Social History  Substance Use Topics  . Smoking status: Never Smoker   . Smokeless tobacco: Never Used  . Alcohol Use: No   Lives with daughters  OB History    Gravida Para Term Preterm AB TAB SAB  Ectopic Multiple Living   '4 4 4            '$ Review of Systems  Constitutional: Negative for fever and chills.  HENT: Positive for rhinorrhea and sinus pressure.   Eyes: Positive for discharge and visual disturbance.  Respiratory: Negative for shortness of breath.   Cardiovascular: Negative for chest pain.  Gastrointestinal: Negative for nausea and vomiting.  Neurological: Negative for headaches.  All other systems reviewed and are negative.  Allergies  Banana and Aspirin  Home Medications   Prior to Admission medications   Medication Sig Start Date End Date Taking? Authorizing Provider  albuterol (PROVENTIL HFA;VENTOLIN HFA) 108 (90 BASE) MCG/ACT inhaler Inhale 2 puffs into the lungs every 6 (six) hours as needed for wheezing.   Yes Historical Provider, MD  amLODipine (NORVASC) 5 MG tablet Take 5 mg by mouth daily.   Yes Historical Provider, MD  Multiple Vitamin (MULTIVITAMIN WITH MINERALS) TABS Take 1 tablet by mouth daily.   Yes Historical Provider, MD  amoxicillin-clavulanate (AUGMENTIN) 875-125 MG per tablet Take 1 tablet by mouth 2 (two) times daily. 02/11/15   Rolland Porter, MD  ciprofloxacin (CIPRO) 500 MG tablet Take 500 mg by mouth 2 (two) times daily.    Historical Provider, MD  HYDROcodone-acetaminophen (NORCO/VICODIN) 5-325 MG per tablet Take 1-2 tablets by mouth every 6 (six) hours as needed for  moderate pain. 02/11/15   Rolland Porter, MD  losartan (COZAAR) 100 MG tablet Take 1 tablet (100 mg total) by mouth daily. 02/11/15   Rolland Porter, MD  oxyCODONE-acetaminophen (PERCOCET/ROXICET) 5-325 MG per tablet Take 1 tablet by mouth every 6 (six) hours as needed for pain. 11/21/12   Lily Kocher, PA-C  oxyCODONE-acetaminophen (PERCOCET/ROXICET) 5-325 MG per tablet Take 1 tablet by mouth every 6 (six) hours as needed for pain. 11/21/12   Lily Kocher, PA-C  potassium chloride SA (K-DUR,KLOR-CON) 20 MEQ tablet Take 1 po BID x 5d then once a day 02/11/15   Rolland Porter, MD  predniSONE (DELTASONE) 10  MG tablet 6,5,4,3,2,1 - take with food 11/21/12   Lily Kocher, PA-C  predniSONE (DELTASONE) 10 MG tablet 6,5,4,3,2,1 - take with food 11/21/12   Lily Kocher, PA-C  sulfamethoxazole-trimethoprim (BACTRIM DS) 800-160 MG per tablet Take 1 tablet by mouth 2 (two) times daily.    Historical Provider, MD   Triage Vitals: BP 235/107 mmHg  Pulse 66  Temp(Src) 97.7 F (36.5 C) (Oral)  Resp 18  Ht '5\' 7"'$  (1.702 m)  Wt 219 lb (99.338 kg)  BMI 34.29 kg/m2  SpO2 100%  Vital signs normal except for hypertension  Physical Exam  Constitutional: She is oriented to person, place, and time. She appears well-developed and well-nourished.  Non-toxic appearance. She does not appear ill. No distress.  HENT:  Head: Normocephalic and atraumatic.  Right Ear: Tympanic membrane and external ear normal.  Left Ear: Tympanic membrane and external ear normal.  Nose: Mucosal edema present. No rhinorrhea.  Mouth/Throat: Oropharynx is clear and moist and mucous membranes are normal. No dental abscesses or uvula swelling.  Right TM was examined Good light reflex with transparent TM  Eyes: Conjunctivae and EOM are normal. Pupils are equal, round, and reactive to light.  Patient has swelling with tenderness over her right lacrimal duct.  Neck: Normal range of motion and full passive range of motion without pain. Neck supple.  Cardiovascular: Normal rate, regular rhythm and normal heart sounds.  Exam reveals no gallop and no friction rub.   No murmur heard. Pulmonary/Chest: Effort normal and breath sounds normal. No respiratory distress. She has no wheezes. She has no rhonchi. She has no rales. She exhibits no tenderness and no crepitus.  Abdominal: Soft. Normal appearance and bowel sounds are normal. She exhibits no distension. There is no tenderness. There is no rebound and no guarding.  Musculoskeletal: Normal range of motion. She exhibits no edema or tenderness.  Moves all extremities well.   Neurological: She is  alert and oriented to person, place, and time. She has normal strength. No cranial nerve deficit.  Skin: Skin is warm, dry and intact. No rash noted. No erythema. No pallor.  Psychiatric: She has a normal mood and affect. Her speech is normal and behavior is normal. Her mood appears not anxious.  Nursing note and vitals reviewed.     ED Course  Procedures (including critical care time)  Medications  amoxicillin-clavulanate (AUGMENTIN) 875-125 MG per tablet (not administered)  potassium chloride SA (K-DUR,KLOR-CON) CR tablet 40 mEq (40 mEq Oral Given 02/11/15 0112)  amoxicillin-clavulanate (AUGMENTIN) 875-125 MG per tablet 1 tablet (1 tablet Oral Given 02/11/15 0223)    DIAGNOSTIC STUDIES: Oxygen Saturation is 100% on RA, nl by my interpretation.    COORDINATION OF CARE: 1:00 AM: Discussed treatment plan with pt at bedside; patient verbalizes understanding and agrees with treatment plan. Patient was started on antibiotics for her lacrimal duct  infection. She was given oral potassium for her hypokalemia.  Patient had asymptomatic hypertension. Her blood pressure improved without treatment in the ED. At discharge her blood pressure was 194/94. Patient cannot tell me when the last time she had her blood pressure checked or what her blood pressure was. She has been noncompliant with her medication. She is only taking her amlodipine 5 mg a day.  Labs Review Results for orders placed or performed during the hospital encounter of 02/10/15  CBC with Differential  Result Value Ref Range   WBC 8.7 4.0 - 10.5 K/uL   RBC 4.98 3.87 - 5.11 MIL/uL   Hemoglobin 14.0 12.0 - 15.0 g/dL   HCT 42.5 36.0 - 46.0 %   MCV 85.3 78.0 - 100.0 fL   MCH 28.1 26.0 - 34.0 pg   MCHC 32.9 30.0 - 36.0 g/dL   RDW 13.6 11.5 - 15.5 %   Platelets 267 150 - 400 K/uL   Neutrophils Relative % 48 %   Neutro Abs 4.3 1.7 - 7.7 K/uL   Lymphocytes Relative 36 %   Lymphs Abs 3.2 0.7 - 4.0 K/uL   Monocytes Relative 9 %    Monocytes Absolute 0.7 0.1 - 1.0 K/uL   Eosinophils Relative 6 %   Eosinophils Absolute 0.5 0.0 - 0.7 K/uL   Basophils Relative 1 %   Basophils Absolute 0.1 0.0 - 0.1 K/uL  Comprehensive metabolic panel  Result Value Ref Range   Sodium 138 135 - 145 mmol/L   Potassium 3.1 (L) 3.5 - 5.1 mmol/L   Chloride 105 101 - 111 mmol/L   CO2 29 22 - 32 mmol/L   Glucose, Bld 151 (H) 65 - 99 mg/dL   BUN 10 6 - 20 mg/dL   Creatinine, Ser 0.86 0.44 - 1.00 mg/dL   Calcium 8.9 8.9 - 10.3 mg/dL   Total Protein 7.3 6.5 - 8.1 g/dL   Albumin 4.1 3.5 - 5.0 g/dL   AST 24 15 - 41 U/L   ALT 23 14 - 54 U/L   Alkaline Phosphatase 97 38 - 126 U/L   Total Bilirubin 0.3 0.3 - 1.2 mg/dL   GFR calc non Af Amer >60 >60 mL/min   GFR calc Af Amer >60 >60 mL/min   Anion gap 4 (L) 5 - 15   Laboratory interpretation all normal except hypokalemia      Imaging Review No results found. I have personally reviewed and evaluated these images and lab results as part of my medical decision-making.   EKG Interpretation   Date/Time:  Friday February 11 2015 00:28:22 EDT Ventricular Rate:  86 PR Interval:  160 QRS Duration: 85 QT Interval:  374 QTC Calculation: 447 R Axis:   23 Text Interpretation:  Sinus rhythm Baseline wander in lead(s) III aVF  Electrode noise No old tracing to compare Confirmed by KNAPP  MD-I, IVA  ([id number]) on 02/11/2015 1:23:06 AM      MDM   Final diagnoses:  Dacrocystitis, right  Hypokalemia  Essential hypertension    New Prescriptions   AMOXICILLIN-CLAVULANATE (AUGMENTIN) 875-125 MG PER TABLET    Take 1 tablet by mouth 2 (two) times daily.   HYDROCODONE-ACETAMINOPHEN (NORCO/VICODIN) 5-325 MG PER TABLET    Take 1-2 tablets by mouth every 6 (six) hours as needed for moderate pain.   LOSARTAN (COZAAR) 100 MG TABLET    Take 1 tablet (100 mg total) by mouth daily.   POTASSIUM CHLORIDE SA (K-DUR,KLOR-CON) 20 MEQ TABLET    Take 1  po BID x 5d then once a day    Plan discharge  Rolland Porter, MD, FACEP   I personally performed the services described in this documentation, which was scribed in my presence. The recorded information has been reviewed and considered.  Rolland Porter, MD, Barbette Or, MD 02/11/15 [phone]

## 2015-02-11 LAB — COMPREHENSIVE METABOLIC PANEL
ALT: 23 U/L (ref 14–54)
AST: 24 U/L (ref 15–41)
Albumin: 4.1 g/dL (ref 3.5–5.0)
Alkaline Phosphatase: 97 U/L (ref 38–126)
Anion gap: 4 — ABNORMAL LOW (ref 5–15)
BUN: 10 mg/dL (ref 6–20)
CHLORIDE: 105 mmol/L (ref 101–111)
CO2: 29 mmol/L (ref 22–32)
Calcium: 8.9 mg/dL (ref 8.9–10.3)
Creatinine, Ser: 0.86 mg/dL (ref 0.44–1.00)
Glucose, Bld: 151 mg/dL — ABNORMAL HIGH (ref 65–99)
POTASSIUM: 3.1 mmol/L — AB (ref 3.5–5.1)
Sodium: 138 mmol/L (ref 135–145)
Total Bilirubin: 0.3 mg/dL (ref 0.3–1.2)
Total Protein: 7.3 g/dL (ref 6.5–8.1)

## 2015-02-11 MED ORDER — LOSARTAN POTASSIUM 100 MG PO TABS: 100.0000 mg | ORAL_TABLET | Freq: Every day | ORAL | Status: DC

## 2015-02-11 MED ORDER — AMOXICILLIN-POT CLAVULANATE 875-125 MG PO TABS: 1.0000 | ORAL_TABLET | Freq: Two times a day (BID) | ORAL | Status: DC

## 2015-02-11 MED ORDER — AMOXICILLIN-POT CLAVULANATE 875-125 MG PO TABS
ORAL_TABLET | ORAL | Status: AC
  Filled 2015-02-11: qty 1

## 2015-02-11 MED ORDER — HYDROCODONE-ACETAMINOPHEN 5-325 MG PO TABS: 1.0000 | ORAL_TABLET | Freq: Four times a day (QID) | ORAL | Status: DC | PRN

## 2015-02-11 MED ORDER — AMOXICILLIN-POT CLAVULANATE 875-125 MG PO TABS
1.0000 | ORAL_TABLET | Freq: Once | ORAL | Status: AC
  Administered 2015-02-11: 1 via ORAL

## 2015-02-11 MED ORDER — POTASSIUM CHLORIDE CRYS ER 20 MEQ PO TBCR: EXTENDED_RELEASE_TABLET | ORAL | Status: DC

## 2015-02-11 MED ORDER — POTASSIUM CHLORIDE CRYS ER 20 MEQ PO TBCR
40.0000 meq | EXTENDED_RELEASE_TABLET | Freq: Once | ORAL | Status: AC
  Administered 2015-02-11: 40 meq via ORAL
  Filled 2015-02-11: qty 2

## 2015-02-11 NOTE — ED Notes (Signed)
Patient on cardiac monitor

## 2015-02-11 NOTE — Discharge Instructions (Signed)
Use heat on your face. Take the antibiotics until gone. Keep your appointment with your ophthalmologist on the 27th. However if you're swelling or pain gets worse call his office to have him recheck you sooner. Return to the ED if you get fever, or vomiting. Your blood pressure is very high. Please get your hydrochlorothiazide refilled and start taking it again today. Get the losartan filled and start taking it today also. Call Dr Ria Comment office to have him recheck your blood pressure next week.  Return to the ED if you get a headache, chest pain or shortness of breath.    Dacryocystitis  Dacryocystitis is an infection of the tear sac (lacrimal sac). The lacrimal sac lies between the inner corner of the eyelids and the nose. The glands of the eyelids produce tears. This is to keep the surface of the eye wet and protect it. These tears drain from the surface of the eyes through a duct in each lid (lacrimal ducts), then through the lacrimal sac into the nose. The tears are then swallowed. If the lacrimal sacs become blocked, bacteria begin to buildup. The lacrimal sacs can become infected. Dacryocystitis may be sudden (acute) or long-lasting (chronic). This problem is most common in infants because the tear ducts are not fully developed and clog easily. In that case, infants may have episodes of tearing and infection. However, in most cases, the problem gets better as the infant grows. CAUSES  The cause is often unknown. Known causes can include:  Malformation of the lacrimal sac.  Injury to the eye.  Eye infection.  Injury or inflammation of the nasal passages. SYMPTOMS   Usually only one eye is involved.  Excessive tearing and watering from the involved eye.  Tenderness, redness, and swelling of the lower lid near the nose.  A sore, red, inflamed bump on the inner corner of the lower lid. DIAGNOSIS  A diagnosis is made after an eye exam to see how much blockage is present and if the surface  of the eye is also infected. A culture of the fluid from the lacrimal sac may be examined to find if a specific infection is present. TREATMENT  Treatment depends on:   The person's age.  Whether or not the infection is chronic or acute.  The amount of blockage that is present. Additional treatment Sometimes massaging the area (starting from the inside of the eye and gently massaging down toward the nose) will improve the condition, combined with antibiotic eyedrops or ointments. If massaging the area does not work, it may be necessary to probe the ducts and open up the drainage system. While this is easily done in the office in adults, probing usually has to be done under general anesthesia in infants.  If the blockage cannot be cleared by probing, surgery may be needed under general anesthesia to create a direct opening for tears to flow between the lacrimal sac and the inside of the nose (dacryocystorhinostomy, DCR). HOME CARE INSTRUCTIONS   Use any antibiotic eyedrops, ointment, or pills as directed by the caregiver. Finish all medicines even if the symptoms start to get better.  Massage the lacrimal sac as directed by the caregiver. SEEK IMMEDIATE MEDICAL CARE IF:   There is increased pain, swelling, redness, or drainage from the eye.  Muscle aches, chills, or a general sick feeling develop.  A fever or persistent symptoms develop for more than 2-3 days.  The fever and symptoms suddenly get worse. MAKE SURE YOU:   Understand  these instructions.  Will watch your condition.  Will get help right away if you are not doing well or get worse. Document Released: 05/11/2000 Document Revised: 09/28/2013 Document Reviewed: 10/15/2011 [facility] Patient Information 2015 [place], [place]. This information is not intended to replace advice given to you by your health care provider. Make sure you discuss any questions you have with your health care provider.  Hypertension Hypertension is  another name for high blood pressure. High blood pressure forces your heart to work harder to pump blood. A blood pressure reading has two numbers, which includes a higher number over a lower number (example: 110/72). HOME CARE   Have your blood pressure rechecked by your doctor.  Only take medicine as told by your doctor. Follow the directions carefully. The medicine does not work as well if you skip doses. Skipping doses also puts you at risk for problems.  Do not smoke.  Monitor your blood pressure at home as told by your doctor. GET HELP IF:  You think you are having a reaction to the medicine you are taking.  You have repeat headaches or feel dizzy.  You have puffiness (swelling) in your ankles.  You have trouble with your vision. GET HELP RIGHT AWAY IF:   You get a very bad headache and are confused.  You feel weak, numb, or faint.  You get chest or belly (abdominal) pain.  You throw up (vomit).  You cannot breathe very well. MAKE SURE YOU:   Understand these instructions.  Will watch your condition.  Will get help right away if you are not doing well or get worse. Document Released: 10/31/2007 Document Revised: 05/19/2013 Document Reviewed: 03/06/2013 [facility] Patient Information 2015 [place], [place]. This information is not intended to replace advice given to you by your health care provider. Make sure you discuss any questions you have with your health care provider.  Hypokalemia Hypokalemia means that the amount of potassium in the blood is lower than normal.Potassium is a chemical, called an electrolyte, that helps regulate the amount of fluid in the body. It also stimulates muscle contraction and helps nerves function properly.Most of the body's potassium is inside of cells, and only a very small amount is in the blood. Because the amount in the blood is so small, minor changes can be life-threatening. CAUSES  Antibiotics.  Diarrhea or vomiting.  Using  laxatives too much, which can cause diarrhea.  Chronic kidney disease.  Water pills (diuretics).  Eating disorders (bulimia).  Low magnesium level.  Sweating a lot. SIGNS AND SYMPTOMS  Weakness.  Constipation.  Fatigue.  Muscle cramps.  Mental confusion.  Skipped heartbeats or irregular heartbeat (palpitations).  Tingling or numbness. DIAGNOSIS  Your health care provider can diagnose hypokalemia with blood tests. In addition to checking your potassium level, your health care provider may also check other lab tests. TREATMENT Hypokalemia can be treated with potassium supplements taken by mouth or adjustments in your current medicines. If your potassium level is very low, you may need to get potassium through a vein (IV) and be monitored in the hospital. A diet high in potassium is also helpful. Foods high in potassium are:  Nuts, such as peanuts and pistachios.  Seeds, such as sunflower seeds and pumpkin seeds.  Peas, lentils, and lima beans.  Whole grain and bran cereals and breads.  Fresh fruit and vegetables, such as apricots, avocado, bananas, cantaloupe, kiwi, oranges, tomatoes, asparagus, and potatoes.  Orange and tomato juices.  Red meats.  Fruit yogurt. HOME  CARE INSTRUCTIONS  Take all medicines as prescribed by your health care provider.  Maintain a healthy diet by including nutritious food, such as fruits, vegetables, nuts, whole grains, and lean meats.  If you are taking a laxative, be sure to follow the directions on the label. SEEK MEDICAL CARE IF:  Your weakness gets worse.  You feel your heart pounding or racing.  You are vomiting or having diarrhea.  You are diabetic and having trouble keeping your blood glucose in the normal range. SEEK IMMEDIATE MEDICAL CARE IF:  You have chest pain, shortness of breath, or dizziness.  You are vomiting or having diarrhea for more than 2 days.  You faint. MAKE SURE YOU:   Understand these  instructions.  Will watch your condition.  Will get help right away if you are not doing well or get worse. Document Released: 05/14/2005 Document Revised: 03/04/2013 Document Reviewed: 11/14/2012 [facility] Patient Information 2015 [place], [place]. This information is not intended to replace advice given to you by your health care provider. Make sure you discuss any questions you have with your health care provider.

## 2015-09-27 DIAGNOSIS — Z0100 Encounter for examination of eyes and vision without abnormal findings: Secondary | ICD-10-CM | POA: Diagnosis not present

## 2015-09-27 DIAGNOSIS — H25013 Cortical age-related cataract, bilateral: Secondary | ICD-10-CM | POA: Diagnosis not present

## 2015-10-26 DIAGNOSIS — H25812 Combined forms of age-related cataract, left eye: Secondary | ICD-10-CM | POA: Diagnosis not present

## 2015-10-26 DIAGNOSIS — H2512 Age-related nuclear cataract, left eye: Secondary | ICD-10-CM | POA: Diagnosis not present

## 2015-10-26 DIAGNOSIS — H25012 Cortical age-related cataract, left eye: Secondary | ICD-10-CM | POA: Diagnosis not present

## 2016-02-09 DIAGNOSIS — H401122 Primary open-angle glaucoma, left eye, moderate stage: Secondary | ICD-10-CM | POA: Diagnosis not present

## 2016-03-14 ENCOUNTER — Encounter: Payer: Self-pay | Admitting: Obstetrics and Gynecology

## 2016-03-14 ENCOUNTER — Ambulatory Visit (INDEPENDENT_AMBULATORY_CARE_PROVIDER_SITE_OTHER): Payer: Medicare Other | Admitting: Obstetrics and Gynecology

## 2016-03-14 VITALS — BP 180/76 | HR 96 | Ht 65.0 in | Wt 216.0 lb

## 2016-03-14 DIAGNOSIS — Z113 Encounter for screening for infections with a predominantly sexual mode of transmission: Secondary | ICD-10-CM | POA: Diagnosis not present

## 2016-03-14 NOTE — Progress Notes (Signed)
Patient ID: CHAUNTAE BULLEY, female   DOB: 1950-10-26, 65 y.o.   MRN: [id number]   Assessment:  Annual Gyn Exam sti screen, pt's partner insists. GC, CHL collected    Plan:  1. pap smear not done s/p abdominal hysterectomy  2. return annually or prn 3    Annual mammogram and regular self exams advised 4. Order serum HSV test, RPR today   Subjective:   Chief Complaint  Patient presents with  . Gynecologic Exam     LORIMAR SMALLS is a 65 y.o. female G4P4000 who presents for annual exam. No LMP recorded. Patient has had a hysterectomy. The patient has no complaints today. Pt states her partner believes she gave him Herpes and she would like to be tested. She states she does not have any sores.    The following portions of the patient's history were reviewed and updated as appropriate: allergies, current medications, past family history, past medical history, past social history, past surgical history and problem list. Past Medical History:  Diagnosis Date  . Asthma   . Carpal tunnel syndrome   . Hypertension     Past Surgical History:  Procedure Laterality Date  . ABDOMINAL HYSTERECTOMY    . biopsy of right breast    . FOOT SURGERY    . implant left eye    . INTRAOCULAR LENS INSERTION       Current Outpatient Prescriptions:  .  albuterol (PROVENTIL HFA;VENTOLIN HFA) 108 (90 BASE) MCG/ACT inhaler, Inhale 2 puffs into the lungs every 6 (six) hours as needed for wheezing., Disp: , Rfl:  .  amLODipine (NORVASC) 5 MG tablet, Take 5 mg by mouth daily., Disp: , Rfl:  .  chlorthalidone (HYGROTON) 25 MG tablet, Take 25 mg by mouth daily., Disp: , Rfl:  .  Fluticasone-Salmeterol (ADVAIR) 100-50 MCG/DOSE AEPB, Inhale 1 puff into the lungs 2 (two) times daily., Disp: , Rfl:  .  losartan (COZAAR) 100 MG tablet, Take 1 tablet (100 mg total) by mouth daily., Disp: 30 tablet, Rfl: 0 .  metFORMIN (GLUCOPHAGE) 500 MG tablet, Take by mouth 2 (two) times daily with a meal., Disp: , Rfl:  .   Multiple Vitamin (MULTIVITAMIN WITH MINERALS) TABS, Take 1 tablet by mouth daily., Disp: , Rfl:  .  HYDROcodone-acetaminophen (NORCO/VICODIN) 5-325 MG per tablet, Take 1-2 tablets by mouth every 6 (six) hours as needed for moderate pain. (Patient not taking: Reported on 03/14/2016), Disp: 10 tablet, Rfl: 0 .  oxyCODONE-acetaminophen (PERCOCET/ROXICET) 5-325 MG per tablet, Take 1 tablet by mouth every 6 (six) hours as needed for pain. (Patient not taking: Reported on 03/14/2016), Disp: 20 tablet, Rfl: 0  Review of Systems Constitutional: negative Gastrointestinal: negative Genitourinary: negative   Objective:  BP (!) 180/76   Pulse 96   Ht 5\' 5"  (1.651 m)   Wt 216 lb (98 kg)   BMI 35.94 kg/m    BMI: Body mass index is 35.94 kg/m.  General Appearance: Alert, appropriate appearance for age. No acute distress HEENT: Grossly normal Neck / Thyroid: no thyromegaly  Cardiovascular: RRR; normal S1, S2, no murmur Lungs: CTA bilaterally Back: No CVAT Breast Exam: No masses or nodes.No dimpling, nipple retraction or discharge. Tissues very even.  Gastrointestinal: Soft, non-tender, no masses or organomegaly Pelvic Exam:  External genitalia: normal general appearance Vaginal: normal mucosa without prolapse or lesions, no suspicious secretions  Adnexa: Mild tenderness on the left. Small, mobile fullness on the left on bimanual exam. No suspicious masses.  Cervix, Uterus  surgically absent  Lymphatic Exam: Non-palpable nodes in neck, clavicular, axillary, or inguinal regions  Skin: no rash or abnormalities Neurologic: Normal gait and speech, no tremor  Psychiatric: Alert and oriented, appropriate affect.  Urinalysis:Not done   Christin Bach. MD Pgr [phone] 4:02 PM    By signing my name below, I, Doreatha Martin, attest that this documentation has been prepared under the direction and in the presence of Tilda Burrow, MD. Electronically Signed: Doreatha Martin, ED Scribe. 03/14/16. 4:02  PM.  I personally performed the services described in this documentation, which was SCRIBED in my presence. The recorded information has been reviewed and considered accurate. It has been edited as necessary during review. Tilda Burrow, MD

## 2016-03-15 LAB — HSV 2 ANTIBODY, IGG: HSV 2 GLYCOPROTEIN G AB, IGG: 15.7 {index} — AB (ref 0.00–0.90)

## 2016-03-15 LAB — RPR: RPR: NONREACTIVE

## 2016-03-16 ENCOUNTER — Telehealth: Payer: Self-pay | Admitting: Obstetrics and Gynecology

## 2016-03-16 LAB — GC/CHLAMYDIA PROBE AMP
Chlamydia trachomatis, NAA: NEGATIVE
Neisseria gonorrhoeae by PCR: NEGATIVE

## 2016-03-16 NOTE — Telephone Encounter (Signed)
Message left for patient indicating that the results of her blood work her back.  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 The timing of that exposure and infection is not able to be determined. This will not give her much help in determining whether her partner was exposed to HSV-2 by her her or by another event.

## 2016-03-19 ENCOUNTER — Telehealth: Payer: Self-pay | Admitting: Obstetrics and Gynecology

## 2016-03-19 NOTE — Telephone Encounter (Signed)
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 Pt verbalized understanding.

## 2016-03-29 ENCOUNTER — Emergency Department (HOSPITAL_COMMUNITY): Payer: Medicare Other

## 2016-03-29 ENCOUNTER — Encounter (HOSPITAL_COMMUNITY): Payer: Self-pay | Admitting: Emergency Medicine

## 2016-03-29 ENCOUNTER — Emergency Department (HOSPITAL_COMMUNITY)
Admission: EM | Admit: 2016-03-29 | Discharge: 2016-03-29 | Disposition: A | Discharge status: Home / Self Care | Payer: Medicare Other | Attending: Emergency Medicine | Admitting: Emergency Medicine

## 2016-03-29 DIAGNOSIS — Z7984 Long term (current) use of oral hypoglycemic drugs: Secondary | ICD-10-CM | POA: Insufficient documentation

## 2016-03-29 DIAGNOSIS — M7989 Other specified soft tissue disorders: Secondary | ICD-10-CM | POA: Diagnosis not present

## 2016-03-29 DIAGNOSIS — M25562 Pain in left knee: Secondary | ICD-10-CM | POA: Diagnosis not present

## 2016-03-29 DIAGNOSIS — I10 Essential (primary) hypertension: Secondary | ICD-10-CM | POA: Insufficient documentation

## 2016-03-29 DIAGNOSIS — L03116 Cellulitis of left lower limb: Secondary | ICD-10-CM | POA: Diagnosis not present

## 2016-03-29 DIAGNOSIS — J45909 Unspecified asthma, uncomplicated: Secondary | ICD-10-CM | POA: Insufficient documentation

## 2016-03-29 DIAGNOSIS — Z79899 Other long term (current) drug therapy: Secondary | ICD-10-CM | POA: Insufficient documentation

## 2016-03-29 LAB — CBC WITH DIFFERENTIAL/PLATELET
Basophils Absolute: 0 10*3/uL (ref 0.0–0.1)
Basophils Relative: 1 %
Eosinophils Absolute: 0.5 10*3/uL (ref 0.0–0.7)
Eosinophils Relative: 6 %
HCT: 39.5 % (ref 36.0–46.0)
Hemoglobin: 13.3 g/dL (ref 12.0–15.0)
Lymphocytes Relative: 33 %
Lymphs Abs: 2.3 10*3/uL (ref 0.7–4.0)
MCH: 28.4 pg (ref 26.0–34.0)
MCHC: 33.7 g/dL (ref 30.0–36.0)
MCV: 84.4 fL (ref 78.0–100.0)
Monocytes Absolute: 0.8 10*3/uL (ref 0.1–1.0)
Monocytes Relative: 11 %
Neutro Abs: 3.5 10*3/uL (ref 1.7–7.7)
Neutrophils Relative %: 49 %
Platelets: 261 10*3/uL (ref 150–400)
RBC: 4.68 MIL/uL (ref 3.87–5.11)
RDW: 13.8 % (ref 11.5–15.5)
WBC: 7.1 10*3/uL (ref 4.0–10.5)

## 2016-03-29 LAB — BASIC METABOLIC PANEL
Anion gap: 8 (ref 5–15)
BUN: 24 mg/dL — ABNORMAL HIGH (ref 6–20)
CO2: 26 mmol/L (ref 22–32)
Calcium: 9.5 mg/dL (ref 8.9–10.3)
Chloride: 102 mmol/L (ref 101–111)
Creatinine, Ser: 1.07 mg/dL — ABNORMAL HIGH (ref 0.44–1.00)
GFR calc Af Amer: >60 mL/min (ref >60)
GFR calc non Af Amer: 53 mL/min — ABNORMAL LOW (ref >60)
Glucose, Bld: 97 mg/dL (ref 65–99)
Potassium: 3.2 mmol/L — ABNORMAL LOW (ref 3.5–5.1)
Sodium: 136 mmol/L (ref 135–145)

## 2016-03-29 MED ORDER — CEPHALEXIN 500 MG PO CAPS: 500.0000 mg | ORAL_CAPSULE | Freq: Four times a day (QID) | ORAL | 0 refills | Status: AC

## 2016-03-29 MED ORDER — CYCLOBENZAPRINE HCL 10 MG PO TABS: 10.0000 mg | ORAL_TABLET | Freq: Two times a day (BID) | ORAL | 0 refills | Status: DC | PRN

## 2016-03-29 MED ORDER — HYDROCODONE-ACETAMINOPHEN 5-325 MG PO TABS
1.0000 | ORAL_TABLET | Freq: Once | ORAL | Status: AC
  Administered 2016-03-29: 1 via ORAL
  Filled 2016-03-29: qty 1

## 2016-03-29 NOTE — Discharge Instructions (Signed)
You have skin infection involving the left knee. Take antibiotics as prescribed. Continue over-the-counter medications as needed for pain. Continue to ice your knee, keep elevated at rest.  Return without fail for worsening symptoms, including worsening swelling or redness, escalating pain, fever, or any other symptoms concerning to you

## 2016-03-29 NOTE — ED Triage Notes (Signed)
Pt reports L knee pain that started on Sunday. No known injury.

## 2016-03-29 NOTE — ED Notes (Signed)
ED Provider at bedside. 

## 2016-03-29 NOTE — ED Provider Notes (Signed)
[facility] DEPT Provider Note   CSN: [id number] Arrival date & time: 03/29/16  1628     History   Chief Complaint Chief Complaint  Patient presents with  . Knee Pain    HPI Kaitlin [name] is a 65 y.o. female.  HPI  Presenting with left knee pain, starting 4 days ago. History of asthma and hypertension. No prior history of knee pain. States that she was on her knees cleaning her bathroom during the daytime on Sunday. Later on that afternoon began to have pain over the left knee. Has noticed some redness and swelling over the area. Has not had fevers or chills, calf tenderness or swelling, chest pain or difficulty breathing. No fall or injury. Taken Tylenol and ibuprofen for pain, without significant good effect.  Past Medical History:  Diagnosis Date  . Asthma   . Carpal tunnel syndrome   . Hypertension     Patient Active Problem List   Diagnosis Date Noted  . Routine screening for STI (sexually transmitted infection) 03/14/2016    Past Surgical History:  Procedure Laterality Date  . ABDOMINAL HYSTERECTOMY    . biopsy of right breast    . FOOT SURGERY    . implant left eye    . INTRAOCULAR LENS INSERTION      OB History    Gravida Para Term Preterm AB Living   '4 4 4         '$ SAB TAB Ectopic Multiple Live Births                   Home Medications    Prior to Admission medications   Medication Sig Start Date End Date Taking? Authorizing Provider  albuterol (PROVENTIL HFA;VENTOLIN HFA) 108 (90 BASE) MCG/ACT inhaler Inhale 2 puffs into the lungs every 6 (six) hours as needed for wheezing.    Historical Provider, MD  amLODipine (NORVASC) 5 MG tablet Take 5 mg by mouth daily.    Historical Provider, MD  cephALEXin (KEFLEX) 500 MG capsule Take 1 capsule (500 mg total) by mouth 4 (four) times daily. 03/29/16 04/05/16  Forde Dandy, MD  chlorthalidone (HYGROTON) 25 MG tablet Take 25 mg by mouth daily.    Historical Provider, MD  cyclobenzaprine (FLEXERIL) 10 MG tablet  Take 1 tablet (10 mg total) by mouth 2 (two) times daily as needed for muscle spasms. 03/29/16   Forde Dandy, MD  Fluticasone-Salmeterol (ADVAIR) 100-50 MCG/DOSE AEPB Inhale 1 puff into the lungs 2 (two) times daily.    Historical Provider, MD  losartan (COZAAR) 100 MG tablet Take 1 tablet (100 mg total) by mouth daily. 02/11/15   Rolland Porter, MD  metFORMIN (GLUCOPHAGE) 500 MG tablet Take by mouth 2 (two) times daily with a meal.    Historical Provider, MD  Multiple Vitamin (MULTIVITAMIN WITH MINERALS) TABS Take 1 tablet by mouth daily.    Historical Provider, MD    Family History Family History  Problem Relation Age of Onset  . Heart failure Mother   . Stroke Mother   . Kidney failure Other     Social History Social History  Substance Use Topics  . Smoking status: Never Smoker  . Smokeless tobacco: Never Used  . Alcohol use Yes     Comment: occ     Allergies   Banana and Aspirin   Review of Systems Review of Systems 10/14 systems reviewed and are negative other than those stated in the HPI   Physical Exam Updated Vital Signs  BP 170/80   Pulse 79   Temp 97.8 F (36.6 C)   Resp 16   Ht '5\' 5"'$  (1.651 m)   Wt 215 lb (97.5 kg)   SpO2 98%   BMI 35.78 kg/m   Physical Exam Physical Exam  Nursing note and vitals reviewed. Constitutional: Well developed, well nourished, non-toxic, and in no acute distress Head: Normocephalic and atraumatic.  Mouth/Throat: Oropharynx is clear and moist.  Neck: Normal range of motion. Neck supple.  Cardiovascular: Normal rate and regular rhythm.   Pulmonary/Chest: Effort normal and breath sounds normal. +2 DP pulses bilaterally Abdominal: Soft. There is no tenderness. There is no rebound and no guarding.  Musculoskeletal: Normal range of motion. soft tissue swelling and overlying erythema and warmth over the left knee to the upper lower leg. No underlying fluctuance. No calf tenderness. Neurological: Alert, no facial droop, fluent speech,  moves all extremities symmetrically Skin: Skin is warm and dry.  Psychiatric: Cooperative   ED Treatments / Results  Labs (all labs ordered are listed, but only abnormal results are displayed) Labs Reviewed  BASIC METABOLIC PANEL - Abnormal; Notable for the following:       Result Value   Potassium 3.2 (*)    BUN 24 (*)    Creatinine, Ser 1.07 (*)    GFR calc non Af Amer 53 (*)    All other components within normal limits  CBC WITH DIFFERENTIAL/PLATELET    EKG  EKG Interpretation None       Radiology Dg Knee Complete 4 Views Left  Result Date: 03/29/2016 CLINICAL DATA:  Left knee swelling and pain with calf pain and redness EXAM: LEFT KNEE - COMPLETE 4+ VIEW COMPARISON:  None. FINDINGS: No fracture or malalignment. Moderate soft tissue swelling anterior to the knee. Mild degenerative changes of the medial and lateral compartments with spurring. Mild superior and inferior patellar spurring. Possible tiny loose bodies project over the central joint on the AP view. IMPRESSION: 1. Moderate soft tissue swelling over the anterior knee. 2. No acute osseous abnormality 3. Mild degenerative changes. Possible small loose bodies projecting over the central joint. Electronically Signed   By: Donavan Foil M.D.   On: 03/29/2016 17:53    Procedures Procedures (including critical care time)  Medications Ordered in ED Medications  HYDROcodone-acetaminophen (NORCO/VICODIN) 5-325 MG per tablet 1 tablet (1 tablet Oral Given 03/29/16 1724)     Initial Impression / Assessment and Plan / ED Course  I have reviewed the triage vital signs and the nursing notes.  Pertinent labs & imaging results that were available during my care of the patient were reviewed by me and considered in my medical decision making (see chart for details).  Clinical Course    With evidence of cellulitis overlying the left knee. No appreciable area of abscess. X-ray of the knee with evidence of degenerative changes,  but no knee effusion. There is overlying soft tissue swelling consistent with her cellulitis. No concerns for septic arthritis. She is otherwise well-appearing, afebrile, and hemodynamically stable. No leukocytosis and no other systemic signs or symptoms of illness. We'll treat from outpatient standpoint with Keflex.  The patient appears reasonably screened and/or stabilized for discharge and I doubt any other medical condition or other [facility] requiring further screening, evaluation, or treatment in the ED at this time prior to discharge.  Strict return and follow-up instructions reviewed. She expressed understanding of all discharge instructions and felt comfortable with the plan of care.   Final Clinical Impressions(s) /  ED Diagnoses   Final diagnoses:  Cellulitis of left knee    New Prescriptions New Prescriptions   CEPHALEXIN (KEFLEX) 500 MG CAPSULE    Take 1 capsule (500 mg total) by mouth 4 (four) times daily.   CYCLOBENZAPRINE (FLEXERIL) 10 MG TABLET    Take 1 tablet (10 mg total) by mouth 2 (two) times daily as needed for muscle spasms.     Forde Dandy, MD 03/29/16 [phone]

## 2018-01-22 DIAGNOSIS — I10 Essential (primary) hypertension: Secondary | ICD-10-CM | POA: Diagnosis not present

## 2018-01-22 DIAGNOSIS — Z6832 Body mass index (BMI) 32.0-32.9, adult: Secondary | ICD-10-CM | POA: Diagnosis not present

## 2018-01-22 DIAGNOSIS — E1169 Type 2 diabetes mellitus with other specified complication: Secondary | ICD-10-CM | POA: Diagnosis not present

## 2018-01-22 DIAGNOSIS — Z79899 Other long term (current) drug therapy: Secondary | ICD-10-CM | POA: Diagnosis not present

## 2018-01-22 DIAGNOSIS — K219 Gastro-esophageal reflux disease without esophagitis: Secondary | ICD-10-CM | POA: Diagnosis not present

## 2018-02-05 ENCOUNTER — Other Ambulatory Visit (HOSPITAL_COMMUNITY): Payer: Self-pay | Admitting: Internal Medicine

## 2018-02-05 DIAGNOSIS — R0989 Other specified symptoms and signs involving the circulatory and respiratory systems: Secondary | ICD-10-CM

## 2018-02-11 ENCOUNTER — Ambulatory Visit (HOSPITAL_COMMUNITY)
Admission: RE | Admit: 2018-02-11 | Discharge: 2018-02-11 | Disposition: A | Discharge status: Home / Self Care | Payer: Medicare HMO | Source: Ambulatory Visit | Attending: Internal Medicine | Admitting: Internal Medicine

## 2018-02-11 DIAGNOSIS — F458 Other somatoform disorders: Secondary | ICD-10-CM | POA: Diagnosis not present

## 2018-02-11 DIAGNOSIS — R0989 Other specified symptoms and signs involving the circulatory and respiratory systems: Secondary | ICD-10-CM

## 2018-02-11 DIAGNOSIS — R09A2 Foreign body sensation, throat: Secondary | ICD-10-CM

## 2018-02-11 DIAGNOSIS — K228 Other specified diseases of esophagus: Secondary | ICD-10-CM | POA: Insufficient documentation

## 2018-02-11 DIAGNOSIS — R1310 Dysphagia, unspecified: Secondary | ICD-10-CM | POA: Diagnosis not present

## 2018-02-18 ENCOUNTER — Encounter: Payer: Self-pay | Admitting: Gastroenterology

## 2018-03-05 ENCOUNTER — Ambulatory Visit: Payer: Medicare HMO | Admitting: Internal Medicine

## 2018-03-26 DIAGNOSIS — M79672 Pain in left foot: Secondary | ICD-10-CM | POA: Diagnosis not present

## 2018-03-28 DIAGNOSIS — I2699 Other pulmonary embolism without acute cor pulmonale: Secondary | ICD-10-CM

## 2018-03-28 HISTORY — DX: Other pulmonary embolism without acute cor pulmonale: I26.99

## 2018-04-15 ENCOUNTER — Encounter: Payer: Self-pay | Admitting: Internal Medicine

## 2018-04-15 ENCOUNTER — Ambulatory Visit: Payer: Medicare HMO | Admitting: Internal Medicine

## 2018-04-15 VITALS — BP 120/78 | HR 89 | Ht 66.0 in | Wt 199.0 lb

## 2018-04-15 DIAGNOSIS — R1310 Dysphagia, unspecified: Secondary | ICD-10-CM

## 2018-04-15 DIAGNOSIS — R0989 Other specified symptoms and signs involving the circulatory and respiratory systems: Secondary | ICD-10-CM | POA: Diagnosis not present

## 2018-04-15 DIAGNOSIS — R1319 Other dysphagia: Secondary | ICD-10-CM

## 2018-04-15 NOTE — Progress Notes (Signed)
Kaitlin [name] 67 y.o. 06/01/1950 [id number]  Assessment & Plan:   Encounter Diagnoses  Name Primary?  . Esophageal dysphagia Yes  . Globus sensation      Solid and liquid symptoms.  Dysphagia is somewhat vague does not seem to be discrete impact dysphagia.  The barium swallow is reported as being normal, I think there is a bit of dilation and she may have some subtle dysmotility based upon the reflux into the proximal esophagus as pointed out.  The tablet did pass though I thought there was a slight temporary hang up.  EGD and possible dilation is recommended.  Would be a good candidate for a Maloney dilation in case there is cricopharyngeal abnormality leading to some of this as well she did have some residue in the pharynx that was cleared with second swallows.  Given the liquid dysphagia may need manometry or other work-up  The risks and benefits as well as alternatives of endoscopic procedure(s) have been discussed and reviewed. All questions answered. The patient agrees to proceed.  I appreciate the opportunity to care for this patient. CC: [name], MD    Subjective:   Chief Complaint: Dysphagia, globus  HPI Patient has a 30-month history of solid and liquid dysphagia as well as globus.  She says she can feel quids and food move very slowly down from her suprasternal notch into the subxiphoid area.  Appetite is off.  She thinks she might have been losing some weight.  She is never had this before she does not have heartburn.  No new stressors no significant anxiety reported.  A barium swallow was done images are reviewed with the patient and it was done 02/11/2018.  She had slight vallecular and piriform sinus residue which cleared after second swallows with mild cervical spondylosis, intact peristalsis but some proximal escape of barium into the upper esophagus which she said correlated with her globus sensation.  There was mild fold thickening in the distal esophagus.  The 13  mm barium tablet passed without difficulty.  She has been placed on pantoprazole but it is not helped.  He thinks she had a colonoscopy at any pain hospital by the surgeon Dr. Franky Macho about 10 years ago.  No records of that in electronic medical record.  No pathology reports that would correlate either.  Her GI review of systems is otherwise negative.  No recent Hemoccult testing reported or in the records I have available.  That includes the [facility] sheet. Allergies  Allergen Reactions  . Banana Anaphylaxis  . Aspirin Other (See Comments)    G.I. Upset    Current Meds  Medication Sig  . albuterol (PROVENTIL HFA;VENTOLIN HFA) 108 (90 BASE) MCG/ACT inhaler Inhale 2 puffs into the lungs every 6 (six) hours as needed for wheezing.  Marland Kitchen amLODipine (NORVASC) 5 MG tablet Take 5 mg by mouth daily.  . chlorthalidone (HYGROTON) 25 MG tablet Take 25 mg by mouth daily.  . Fluticasone-Salmeterol (ADVAIR) 100-50 MCG/DOSE AEPB Inhale 1 puff into the lungs 2 (two) times daily.  Marland Kitchen losartan (COZAAR) 100 MG tablet Take 1 tablet (100 mg total) by mouth daily.  . metFORMIN (GLUCOPHAGE) 500 MG tablet Take by mouth 2 (two) times daily with a meal.  . Multiple Vitamin (MULTIVITAMIN WITH MINERALS) TABS Take 1 tablet by mouth daily.  . pantoprazole (PROTONIX) 40 MG tablet Take 40 mg by mouth daily.   Past Medical History:  Diagnosis Date  . Asthma   . Carpal tunnel syndrome   .  Hypertension    Past Surgical History:  Procedure Laterality Date  . ABDOMINAL HYSTERECTOMY    . biopsy of right breast    . FOOT SURGERY    . implant left eye    . 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   family history includes Heart failure in her mother; Kidney failure in her  other; Stroke in her mother.   Review of Systems See HPI   some dyspnea allergies night sweats and cough.  All other review of systems are negative.  Objective:   Physical Exam @BP  120/78   Pulse 89   Ht 5\' 6"  (1.676 m)   Wt 199 lb (90.3 kg)   BMI 32.12 kg/m @  General:  Well-developed, well-nourished and in no acute distress Eyes:  anicteric. ENT:   Mouth and posterior pharynx free of lesions. Teeth poor - lower moreso Neck:   supple w/o thyromegaly or mass.  Lungs: Clear to auscultation bilaterally. Heart:   S1S2, no rubs, murmurs, gallops. Abdomen:  soft, non-tender, no hepatosplenomegaly, hernia, or mass and BS+.  Lymph:  no cervical or supraclavicular adenopathy. Extremities:   no edema, cyanosis or clubbing Skin   no rash. Neuro:  A&O x 3.  Psych:  appropriate mood and  Affect.   Data Reviewed:  See HPI

## 2018-04-15 NOTE — Patient Instructions (Signed)
  You have been scheduled for an endoscopy. Please follow written instructions given to you at your visit today. If you use inhalers (even only as needed), please bring them with you on the day of your procedure.   I appreciate the opportunity to care for you. Carl Gessner, MD, FACG 

## 2018-04-21 DIAGNOSIS — C801 Malignant (primary) neoplasm, unspecified: Secondary | ICD-10-CM

## 2018-04-21 DIAGNOSIS — J189 Pneumonia, unspecified organism: Secondary | ICD-10-CM

## 2018-04-21 HISTORY — DX: Pneumonia, unspecified organism: J18.9

## 2018-04-21 HISTORY — DX: Malignant (primary) neoplasm, unspecified: C80.1

## 2018-04-24 ENCOUNTER — Other Ambulatory Visit: Payer: Self-pay

## 2018-04-24 ENCOUNTER — Emergency Department (HOSPITAL_COMMUNITY): Payer: Medicare HMO

## 2018-04-24 ENCOUNTER — Encounter (HOSPITAL_COMMUNITY): Payer: Self-pay

## 2018-04-24 ENCOUNTER — Inpatient Hospital Stay (HOSPITAL_COMMUNITY)
Admission: EM | Admit: 2018-04-24 | Discharge: 2018-04-29 | DRG: 871 | Disposition: A | Discharge status: Home / Self Care | Payer: Medicare HMO | Attending: Internal Medicine | Admitting: Internal Medicine

## 2018-04-24 DIAGNOSIS — I313 Pericardial effusion (noninflammatory): Secondary | ICD-10-CM | POA: Diagnosis present

## 2018-04-24 DIAGNOSIS — R6520 Severe sepsis without septic shock: Secondary | ICD-10-CM | POA: Diagnosis not present

## 2018-04-24 DIAGNOSIS — Z7951 Long term (current) use of inhaled steroids: Secondary | ICD-10-CM | POA: Diagnosis not present

## 2018-04-24 DIAGNOSIS — I2694 Multiple subsegmental pulmonary emboli without acute cor pulmonale: Secondary | ICD-10-CM | POA: Diagnosis not present

## 2018-04-24 DIAGNOSIS — I361 Nonrheumatic tricuspid (valve) insufficiency: Secondary | ICD-10-CM | POA: Diagnosis not present

## 2018-04-24 DIAGNOSIS — J45909 Unspecified asthma, uncomplicated: Secondary | ICD-10-CM | POA: Diagnosis present

## 2018-04-24 DIAGNOSIS — E861 Hypovolemia: Secondary | ICD-10-CM | POA: Diagnosis present

## 2018-04-24 DIAGNOSIS — A419 Sepsis, unspecified organism: Secondary | ICD-10-CM | POA: Diagnosis not present

## 2018-04-24 DIAGNOSIS — Z961 Presence of intraocular lens: Secondary | ICD-10-CM | POA: Diagnosis not present

## 2018-04-24 DIAGNOSIS — R1310 Dysphagia, unspecified: Secondary | ICD-10-CM | POA: Diagnosis not present

## 2018-04-24 DIAGNOSIS — E878 Other disorders of electrolyte and fluid balance, not elsewhere classified: Secondary | ICD-10-CM | POA: Diagnosis not present

## 2018-04-24 DIAGNOSIS — N179 Acute kidney failure, unspecified: Secondary | ICD-10-CM | POA: Diagnosis not present

## 2018-04-24 DIAGNOSIS — Z79899 Other long term (current) drug therapy: Secondary | ICD-10-CM

## 2018-04-24 DIAGNOSIS — D649 Anemia, unspecified: Secondary | ICD-10-CM | POA: Diagnosis not present

## 2018-04-24 DIAGNOSIS — E876 Hypokalemia: Secondary | ICD-10-CM | POA: Diagnosis present

## 2018-04-24 DIAGNOSIS — I10 Essential (primary) hypertension: Secondary | ICD-10-CM | POA: Diagnosis present

## 2018-04-24 DIAGNOSIS — J9601 Acute respiratory failure with hypoxia: Secondary | ICD-10-CM | POA: Diagnosis not present

## 2018-04-24 DIAGNOSIS — Z886 Allergy status to analgesic agent status: Secondary | ICD-10-CM

## 2018-04-24 DIAGNOSIS — R918 Other nonspecific abnormal finding of lung field: Secondary | ICD-10-CM

## 2018-04-24 DIAGNOSIS — Z823 Family history of stroke: Secondary | ICD-10-CM

## 2018-04-24 DIAGNOSIS — Z91018 Allergy to other foods: Secondary | ICD-10-CM

## 2018-04-24 DIAGNOSIS — I2699 Other pulmonary embolism without acute cor pulmonale: Secondary | ICD-10-CM | POA: Diagnosis present

## 2018-04-24 DIAGNOSIS — J189 Pneumonia, unspecified organism: Secondary | ICD-10-CM | POA: Diagnosis not present

## 2018-04-24 DIAGNOSIS — E119 Type 2 diabetes mellitus without complications: Secondary | ICD-10-CM | POA: Diagnosis not present

## 2018-04-24 DIAGNOSIS — J9600 Acute respiratory failure, unspecified whether with hypoxia or hypercapnia: Secondary | ICD-10-CM | POA: Diagnosis not present

## 2018-04-24 DIAGNOSIS — Z90710 Acquired absence of both cervix and uterus: Secondary | ICD-10-CM

## 2018-04-24 DIAGNOSIS — R0602 Shortness of breath: Secondary | ICD-10-CM | POA: Diagnosis not present

## 2018-04-24 DIAGNOSIS — Z7984 Long term (current) use of oral hypoglycemic drugs: Secondary | ICD-10-CM | POA: Diagnosis not present

## 2018-04-24 DIAGNOSIS — R590 Localized enlarged lymph nodes: Secondary | ICD-10-CM | POA: Diagnosis present

## 2018-04-24 DIAGNOSIS — E871 Hypo-osmolality and hyponatremia: Secondary | ICD-10-CM | POA: Diagnosis present

## 2018-04-24 DIAGNOSIS — J181 Lobar pneumonia, unspecified organism: Secondary | ICD-10-CM | POA: Diagnosis not present

## 2018-04-24 DIAGNOSIS — R042 Hemoptysis: Secondary | ICD-10-CM | POA: Diagnosis present

## 2018-04-24 DIAGNOSIS — Z8249 Family history of ischemic heart disease and other diseases of the circulatory system: Secondary | ICD-10-CM

## 2018-04-24 LAB — CBC WITH DIFFERENTIAL/PLATELET
Abs Immature Granulocytes: 0.13 10*3/uL — ABNORMAL HIGH (ref 0.00–0.07)
BASOS PCT: 0 %
Basophils Absolute: 0 10*3/uL (ref 0.0–0.1)
EOS ABS: 0 10*3/uL (ref 0.0–0.5)
EOS PCT: 0 %
HEMATOCRIT: 36.2 % (ref 36.0–46.0)
Hemoglobin: 11.6 g/dL — ABNORMAL LOW (ref 12.0–15.0)
Immature Granulocytes: 1 %
LYMPHS ABS: 0.9 10*3/uL (ref 0.7–4.0)
Lymphocytes Relative: 5 %
MCH: 26.9 pg (ref 26.0–34.0)
MCHC: 32 g/dL (ref 30.0–36.0)
MCV: 83.8 fL (ref 80.0–100.0)
Monocytes Absolute: 1.9 10*3/uL — ABNORMAL HIGH (ref 0.1–1.0)
Monocytes Relative: 11 %
Neutro Abs: 14.6 10*3/uL — ABNORMAL HIGH (ref 1.7–7.7)
Neutrophils Relative %: 83 %
PLATELETS: 377 10*3/uL (ref 150–400)
RBC: 4.32 MIL/uL (ref 3.87–5.11)
RDW: 12.9 % (ref 11.5–15.5)
WBC: 17.6 10*3/uL — AB (ref 4.0–10.5)
nRBC: 0 % (ref 0.0–0.2)

## 2018-04-24 LAB — COMPREHENSIVE METABOLIC PANEL
ALT: 44 U/L (ref 0–44)
AST: 54 U/L — ABNORMAL HIGH (ref 15–41)
Albumin: 3.5 g/dL (ref 3.5–5.0)
Alkaline Phosphatase: 188 U/L — ABNORMAL HIGH (ref 38–126)
Anion gap: 14 (ref 5–15)
BUN: 18 mg/dL (ref 8–23)
CO2: 23 mmol/L (ref 22–32)
Calcium: 8.9 mg/dL (ref 8.9–10.3)
Chloride: 93 mmol/L — ABNORMAL LOW (ref 98–111)
Creatinine, Ser: 1.03 mg/dL — ABNORMAL HIGH (ref 0.44–1.00)
GFR calc Af Amer: >60 mL/min (ref >60)
GFR calc non Af Amer: 56 mL/min — ABNORMAL LOW (ref >60)
Glucose, Bld: 109 mg/dL — ABNORMAL HIGH (ref 70–99)
Potassium: 2.9 mmol/L — ABNORMAL LOW (ref 3.5–5.1)
Sodium: 130 mmol/L — ABNORMAL LOW (ref 135–145)
Total Bilirubin: 1.9 mg/dL — ABNORMAL HIGH (ref 0.3–1.2)
Total Protein: 7.8 g/dL (ref 6.5–8.1)

## 2018-04-24 LAB — SODIUM, URINE, RANDOM: Sodium, Ur: 32 mmol/L

## 2018-04-24 LAB — OSMOLALITY, URINE: Osmolality, Ur: 316 mOsm/kg (ref 300–900)

## 2018-04-24 LAB — OSMOLALITY: Osmolality: 289 mOsm/kg (ref 275–295)

## 2018-04-24 LAB — D-DIMER, QUANTITATIVE: D-Dimer, Quant: 14.76 ug/mL-FEU — ABNORMAL HIGH (ref 0.00–0.50)

## 2018-04-24 LAB — LACTIC ACID, PLASMA
Lactic Acid, Venous: 1.2 mmol/L (ref 0.5–1.9)
Lactic Acid, Venous: 3.5 mmol/L (ref 0.5–1.9)

## 2018-04-24 LAB — MRSA PCR SCREENING: MRSA by PCR: NEGATIVE

## 2018-04-24 LAB — HEPARIN LEVEL (UNFRACTIONATED): Heparin Unfractionated: 0.69 IU/mL (ref 0.30–0.70)

## 2018-04-24 MED ORDER — POTASSIUM CHLORIDE 10 MEQ/100ML IV SOLN
10.0000 meq | Freq: Once | INTRAVENOUS | Status: AC
  Administered 2018-04-24: 10 meq via INTRAVENOUS
  Filled 2018-04-24: qty 100

## 2018-04-24 MED ORDER — SODIUM CHLORIDE 0.9 % IV SOLN
500.0000 mg | INTRAVENOUS | Status: DC
  Administered 2018-04-24 – 2018-04-27 (×4): 500 mg via INTRAVENOUS
  Filled 2018-04-24 (×7): qty 500

## 2018-04-24 MED ORDER — ACETAMINOPHEN 500 MG PO TABS
1000.0000 mg | ORAL_TABLET | Freq: Once | ORAL | Status: AC
  Administered 2018-04-24: 1000 mg via ORAL
  Filled 2018-04-24: qty 2

## 2018-04-24 MED ORDER — HEPARIN BOLUS VIA INFUSION
4000.0000 [IU] | Freq: Once | INTRAVENOUS | Status: AC
  Administered 2018-04-24: 4000 [IU] via INTRAVENOUS

## 2018-04-24 MED ORDER — ARFORMOTEROL TARTRATE 15 MCG/2ML IN NEBU
15.0000 ug | INHALATION_SOLUTION | Freq: Two times a day (BID) | RESPIRATORY_TRACT | Status: DC
  Administered 2018-04-24 – 2018-04-29 (×10): 15 ug via RESPIRATORY_TRACT
  Filled 2018-04-24 (×10): qty 2

## 2018-04-24 MED ORDER — HEPARIN (PORCINE) 25000 UT/250ML-% IV SOLN
1900.0000 [IU]/h | INTRAVENOUS | Status: DC
  Administered 2018-04-24: 1300 [IU]/h via INTRAVENOUS
  Administered 2018-04-25: 1900 [IU]/h via INTRAVENOUS
  Administered 2018-04-25: 1700 [IU]/h via INTRAVENOUS
  Administered 2018-04-26 – 2018-04-28 (×4): 1900 [IU]/h via INTRAVENOUS
  Filled 2018-04-24 (×7): qty 250

## 2018-04-24 MED ORDER — SODIUM CHLORIDE 0.9 % IV SOLN
2.0000 g | INTRAVENOUS | Status: AC
  Administered 2018-04-24 – 2018-04-28 (×5): 2 g via INTRAVENOUS
  Filled 2018-04-24 (×6): qty 20

## 2018-04-24 MED ORDER — SODIUM CHLORIDE 0.9 % IV SOLN
INTRAVENOUS | Status: DC
  Administered 2018-04-24 – 2018-04-25 (×2): via INTRAVENOUS

## 2018-04-24 MED ORDER — ONDANSETRON HCL 4 MG/2ML IJ SOLN
4.0000 mg | Freq: Four times a day (QID) | INTRAMUSCULAR | Status: DC | PRN
  Administered 2018-04-24 – 2018-04-26 (×2): 4 mg via INTRAVENOUS
  Filled 2018-04-24 (×2): qty 2

## 2018-04-24 MED ORDER — BUDESONIDE 0.5 MG/2ML IN SUSP
0.5000 mg | Freq: Two times a day (BID) | RESPIRATORY_TRACT | Status: DC
  Administered 2018-04-24 – 2018-04-29 (×10): 0.5 mg via RESPIRATORY_TRACT
  Filled 2018-04-24 (×10): qty 2

## 2018-04-24 MED ORDER — FENTANYL CITRATE (PF) 100 MCG/2ML IJ SOLN
50.0000 ug | Freq: Once | INTRAMUSCULAR | Status: AC
  Administered 2018-04-24: 50 ug via INTRAVENOUS
  Filled 2018-04-24: qty 2

## 2018-04-24 MED ORDER — IOPAMIDOL (ISOVUE-370) INJECTION 76%
100.0000 mL | Freq: Once | INTRAVENOUS | Status: AC | PRN
  Administered 2018-04-24: 100 mL via INTRAVENOUS

## 2018-04-24 MED ORDER — LEVALBUTEROL HCL 0.63 MG/3ML IN NEBU: 0.6300 mg | INHALATION_SOLUTION | RESPIRATORY_TRACT | Status: DC | PRN

## 2018-04-24 MED ORDER — MAGNESIUM SULFATE 2 GM/50ML IV SOLN
2.0000 g | INTRAVENOUS | Status: AC
  Administered 2018-04-24: 2 g via INTRAVENOUS
  Filled 2018-04-24: qty 50

## 2018-04-24 MED ORDER — SODIUM CHLORIDE 0.9 % IV BOLUS (SEPSIS)
2700.0000 mL | Freq: Once | INTRAVENOUS | Status: AC
  Administered 2018-04-24: 2700 mL via INTRAVENOUS

## 2018-04-24 MED ORDER — ALBUTEROL (5 MG/ML) CONTINUOUS INHALATION SOLN
10.0000 mg/h | INHALATION_SOLUTION | RESPIRATORY_TRACT | Status: DC
  Administered 2018-04-24: 10 mg/h via RESPIRATORY_TRACT
  Filled 2018-04-24: qty 20

## 2018-04-24 MED ORDER — POTASSIUM CHLORIDE CRYS ER 20 MEQ PO TBCR
40.0000 meq | EXTENDED_RELEASE_TABLET | Freq: Once | ORAL | Status: AC
  Administered 2018-04-24: 40 meq via ORAL
  Filled 2018-04-24: qty 2

## 2018-04-24 MED ORDER — MORPHINE SULFATE (PF) 2 MG/ML IV SOLN: 1.0000 mg | INTRAVENOUS | Status: DC | PRN

## 2018-04-24 NOTE — Progress Notes (Signed)
Call from  Dr Sabra Heck [facility] - [facility]  - hx of asthma  - fever, dyspnea - d-dimer 15;  Dropped BP x 1 - responded to fluids  -CTA wth multifocal PE with RLL infarct but also LLL Lung mass  Physiology  - Lactate 3.5, WBC 17 , Fever 103F but also  LL Lung MAss - SIRS - HR 115, RR 32, short in sentences     LABS    PULMONARY No results for input(s): PHART, PCO2ART, PO2ART, HCO3, TCO2, O2SAT in the last 168 hours.  Invalid input(s): PCO2, PO2  CBC Recent Labs  Lab 04/24/18 1347  HGB 11.6*  HCT 36.2  WBC 17.6*  PLT 377    COAGULATION No results for input(s): INR in the last 168 hours.  CARDIAC  No results for input(s): TROPONINI in the last 168 hours. No results for input(s): PROBNP in the last 168 hours.   CHEMISTRY Recent Labs  Lab 04/24/18 1347  NA 130*  K 2.9*  CL 93*  CO2 23  GLUCOSE 109*  BUN 18  CREATININE 1.03*  CALCIUM 8.9   Estimated Creatinine Clearance: 59.9 mL/min (A) (by C-G formula based on SCr of 1.03 mg/dL (H)).   LIVER Recent Labs  Lab 04/24/18 1347  AST 54*  ALT 44  ALKPHOS 188*  BILITOT 1.9*  PROT 7.8  ALBUMIN 3.5     INFECTIOUS Recent Labs  Lab 04/24/18 1414 04/24/18 1601  LATICACIDVEN 1.2 3.5*     ENDOCRINE CBG (last 3)  No results for input(s): GLUCAP in the last 72 hours.       IMAGING x48h  - image(s) personally visualized  -   highlighted in bold Dg Chest 2 View  Result Date: 04/24/2018 CLINICAL DATA:  Productive cough, shortness of breath, RIGHT-side chest pain and back pain for 2 days, history asthma, hypertension EXAM: CHEST - 2 VIEW COMPARISON:  08/05/2008 FINDINGS: Normal heart size and mediastinal contours. Minimal fluid or thickening at the minor fissure. RIGHT lower lobe infiltrate consistent with pneumonia. Remaining lungs clear. No pleural effusion or pneumothorax. IMPRESSION: RIGHT lower lobe pneumonia. Electronically Signed   By: Lavonia Dana M.D.   On: 04/24/2018 15:03        A/P PE Lung  Mass Possible/Likely pneumonia  PLAN -  Fluids - LOT - IV heparin gtt (if worse tpa) - abx - check trop/track lactate - accept MICU at [facility]    D./w Dr Rodman Key    Dr. Brand Males, M.D., F.C.C.P,  Pulmonary and Critical Care Medicine Staff Physician, [place] Director - Interstitial Lung Disease  Program  [facility] [place], [place], [address]  Pager: [phone], If no answer or between  15:00h - 7:00h: call [phone] Telephone: [phone]  5:11 PM 04/24/2018

## 2018-04-24 NOTE — H&P (Addendum)
NAME:  Kaitlin [name], MRN:  [id number], DOB:  [birth year], LOS: 0 ADMISSION DATE:  04/24/2018, CONSULTATION DATE:  04/24/18 REFERRING MD:  Hyacinth Meeker  CHIEF COMPLAINT:  Dyspnea   Brief History   Kaitlin [name] is a 67 y.o. female who was transferred from [facility] with large right PE and LLL mass.  History of present illness   Kaitlin [name] is a 67 y.o. female who has a PMH of HTN, Asthma, Carpal Tunnel.  She presented to [facility] ED 11/28 with dyspnea x 3 days.  Symptoms gradually worsened to the point that she started to get tachypneic.  She also complained of subjective fevers and chills, dry cough, and right sided pleuritic chest pain.  She tried using her albuterol inhaler and nebulizer at home but got minimal relief.  She did not notice any hemoptysis until she was in the ED when she had 1 -2 episodes of blood tinged sputum.  Denies lightheadedness, dizziness, LE edema, tobacco use.  No recent exposures to known sick contacts.  In ED, she had CTA of the chest that demonstrated large right sided PE with possible RLL infarct vs infiltrate as well as a  LLL 3.8 x 3.7 x 2.1cm mass with hilar and mediastinal adenopathy.  She was subsequently transferred to [facility] for further evaluation and management.  She denied any hx of prior malignancy, VTE, recent travel / periods of prolonged immobilization, hormone use.  Past Medical History  HTN, Asthma, Dysphagia, Carpal Tunnel.  Significant Hospital Events   11/28 > admit.  Consults:  None.  Procedures:  None.  Significant Diagnostic Tests:  CTA chest 11/28 >  large right sided PE with possible RLL infarct vs infiltrate.  LLL 3.8 x 3.7 x 2.1cm mass with hilar and mediastinal adenopathy.  Micro Data:  Blood 11/28 >  Sputum 11/28 >   Antimicrobials:  Azithromycin 11/28 >  Ceftriaxone 11/28 >    Interim history/subjective:  On 2L O2 via [place], feels better than when first got to ED but is still dyspneic.  Still having right sided pleuritic pain.  Objective:    Blood pressure (!) 105/50, pulse (!) 115, temperature 99.5 F (37.5 C), temperature source Oral, resp. rate (!) 28, height 5\' 6"  (1.676 m), weight 90 kg, SpO2 100 %.        Intake/Output Summary (Last 24 hours) at 04/24/2018 2005 Last data filed at 04/24/2018 1853 Gross per 24 hour  Intake 3200 ml  Output -  Net 3200 ml   Filed Weights   04/24/18 1340  Weight: 90 kg    Examination: General: Adult female, in NAD. Neuro: A&O x 3, no deficits. HEENT: [place]/AT. Sclerae anicteric.  EOMI. Cardiovascular: RRR, no M/R/G.  Lungs: Respirations shallow but unlabored.  CTA bilaterally, No W/R/R. Abdomen: Obese, BS x 4, soft, NT/ND.  Musculoskeletal: No gross deformities, no edema.  Skin: Intact, warm, no rashes.  Assessment & Plan:   Large right sided PE with possible RLL infarct vs infiltrate - currently stable on 2L O2 via [place]. - Continue heparin gtt, no role for systemic or catheter directed lytics at this point. - Trend troponins. - F/u on echo, LE duplex.  Pleuritic chest pain - due to above +/- CAP. - Low dose morphine ordered.  Possible CAP. - Continue empiric ceftriaxone / azithromycin. - Follow cultures.  Hx asthma. - Continue BD's.  LLL mass with hilar and mediastinal adenopathy - concerning for malignancy. - CT guided biopsy might be least invasive means of obtaining  tissue sample. - Will need outpatient f/u with oncology.  Hyponatremia - presumed hypovolemic.  Though can not r/o SIADH in setting of lung mass. - Continue fluids. - Assess serum osmoles, urine osmoles, urine Na.  Hypokalemia. - 40 mEq PO x 1. - Follow BMP.  AKI - presumed hypovolemic. - Continue fluids. - Follow BMP.  Dysphagia - seen by GI who plans to perform a maloney dilation as an outpatient. - F/u with GI as outpatient.  Best Practice:  Diet: Heart healthy. Pain/Anxiety/Delirium protocol (if indicated): N/A. VAP protocol (if indicated): N/A. DVT prophylaxis: Heparin gtt. GI  prophylaxis: N/A. Glucose control: N/A. Mobility: Bedrest. Code Status: Full. Family Communication: None available. Disposition: ICU.  Labs   CBC: Recent Labs  Lab 04/24/18 1347  WBC 17.6*  NEUTROABS 14.6*  HGB 11.6*  HCT 36.2  MCV 83.8  PLT 377   Basic Metabolic Panel: Recent Labs  Lab 04/24/18 1347  NA 130*  K 2.9*  CL 93*  CO2 23  GLUCOSE 109*  BUN 18  CREATININE 1.03*  CALCIUM 8.9   GFR: Estimated Creatinine Clearance: 59.9 mL/min (A) (by C-G formula based on SCr of 1.03 mg/dL (H)). Recent Labs  Lab 04/24/18 1347 04/24/18 1414 04/24/18 1601  WBC 17.6*  --   --   LATICACIDVEN  --  1.2 3.5*   Liver Function Tests: Recent Labs  Lab 04/24/18 1347  AST 54*  ALT 44  ALKPHOS 188*  BILITOT 1.9*  PROT 7.8  ALBUMIN 3.5   No results for input(s): LIPASE, AMYLASE in the last 168 hours. No results for input(s): AMMONIA in the last 168 hours. ABG No results found for: PHART, PCO2ART, PO2ART, HCO3, TCO2, ACIDBASEDEF, O2SAT  Coagulation Profile: No results for input(s): INR, PROTIME in the last 168 hours. Cardiac Enzymes: No results for input(s): CKTOTAL, CKMB, CKMBINDEX, TROPONINI in the last 168 hours. HbA1C: Hgb A1c MFr Bld  Date/Time Value Ref Range Status  09/05/2012 05:00 AM 7.1 (H) <5.7 % Final    Comment:    (NOTE)                                                                       According to the ADA Clinical Practice Recommendations for 2011, when HbA1c is used as a screening test:  >=6.5%   Diagnostic of Diabetes Mellitus           (if abnormal result is confirmed) 5.7-6.4%   Increased risk of developing Diabetes Mellitus References:Diagnosis and Classification of Diabetes Mellitus,Diabetes Care,2011,34(Suppl 1):S62-S69 and Standards of Medical Care in         Diabetes - 2011,Diabetes Care,2011,34 (Suppl 1):S11-S61.   CBG: No results for input(s): GLUCAP in the last 168 hours.  Review of Systems:   All negative; except for those that  are bolded, which indicate positives.  Constitutional: weight loss, weight gain, night sweats, fevers, chills, fatigue, weakness.  HEENT: headaches, sore throat, sneezing, nasal congestion, post nasal drip, difficulty swallowing, tooth/dental problems, visual complaints, visual changes, ear aches. Neuro: difficulty with speech, weakness, numbness, ataxia. CV:  chest pain, orthopnea, PND, swelling in lower extremities, dizziness, palpitations, syncope.  Resp: cough, hemoptysis, dyspnea, wheezing. GI: heartburn, indigestion, abdominal pain, nausea, vomiting, diarrhea, constipation, change in bowel habits, loss  of appetite, hematemesis, melena, hematochezia.  GU: dysuria, change in color of urine, urgency or frequency, flank pain, hematuria. MSK: joint pain or swelling, decreased range of motion. Psych: change in mood or affect, depression, anxiety, suicidal ideations, homicidal ideations. Skin: rash, itching, bruising.   Past medical history  She,  has a past medical history of Asthma, Carpal tunnel syndrome, and Hypertension.   Surgical History    Past Surgical History:  Procedure Laterality Date  . ABDOMINAL HYSTERECTOMY    . biopsy of right breast    . FOOT SURGERY    . implant left eye    . INTRAOCULAR LENS INSERTION       Social History   reports that she has never smoked. She has never used smokeless tobacco. She reports that she drinks alcohol. She reports that she does not use drugs.   Family history   Her family history includes Heart failure in her mother; Kidney failure in her other; Stroke in her mother.   Allergies Allergies  Allergen Reactions  . Banana Anaphylaxis  . Aspirin Other (See Comments)    G.I. Upset      Home meds  Prior to Admission medications   Medication Sig Start Date End Date Taking? Authorizing Provider  albuterol (PROVENTIL HFA;VENTOLIN HFA) 108 (90 BASE) MCG/ACT inhaler Inhale 2 puffs into the lungs every 6 (six) hours as needed for  wheezing.   Yes [provider]  amLODipine (NORVASC) 5 MG tablet Take 5 mg by mouth daily.   Yes [provider]  chlorthalidone (HYGROTON) 25 MG tablet Take 25 mg by mouth daily.   Yes [provider]  Fluticasone-Salmeterol (ADVAIR) 100-50 MCG/DOSE AEPB Inhale 1 puff into the lungs 2 (two) times daily.   Yes [provider]  losartan (COZAAR) 100 MG tablet Take 1 tablet (100 mg total) by mouth daily. 02/11/15  Yes Devoria Albe, MD  meloxicam (MOBIC) 7.5 MG tablet Take 1 tablet by mouth 2 (two) times daily. 03/26/18  Yes [provider]  metFORMIN (GLUCOPHAGE) 500 MG tablet Take 500 mg by mouth daily as needed.    Yes [provider]  pantoprazole (PROTONIX) 40 MG tablet Take 40 mg by mouth daily.   Yes [provider]     Rutherford Guys, PA - [facility] Pager: [phone]  or [phone] 04/24/2018, 8:05 PM  Agree with assessment and plan as stated above by PA-C Desai.  Downgrade in am likely

## 2018-04-24 NOTE — ED Triage Notes (Signed)
Pt c/o productive cough, sob, r side pain for the past 2 days.

## 2018-04-24 NOTE — ED Notes (Signed)
Date and time results received: 04/24/18 4:44 PM  (use smartphrase ".now" to insert current time)  Test: lactic Critical Value: 3.5  Name of Provider Notified: miller  Orders Received? Or Actions Taken?:

## 2018-04-24 NOTE — Progress Notes (Signed)
ANTICOAGULATION CONSULT NOTE - Initial Consult  Pharmacy Consult for heparin dosing Indication: pulmonary embolus  Allergies  Allergen Reactions  . Banana Anaphylaxis  . Aspirin Other (See Comments)    G.I. Upset     Patient Measurements: Height: 5\' 6"  (167.6 cm) Weight: 198 lb 6.6 oz (90 kg) IBW/kg (Calculated) : 59.3 Heparin Dosing Weight: HEPARIN DW (KG): 78.9  Vital Signs: Temp: 99.5 F (37.5 C) (11/28 1537) Temp Source: Oral (11/28 1537) BP: 99/55 (11/28 1700) Pulse Rate: 110 (11/28 1700)  Labs: Recent Labs    04/24/18 1347  HGB 11.6*  HCT 36.2  PLT 377  CREATININE 1.03*    Estimated Creatinine Clearance: 59.9 mL/min (A) (by C-G formula based on SCr of 1.03 mg/dL (H)).   Medical History: Past Medical History:  Diagnosis Date  . Asthma   . Carpal tunnel syndrome   . Hypertension      Assessment: Pharmacy consulted to dose heparin infusion for this 73 yof with acute multi-focal PE.   D-dimer is 14.76 mcg/mL-FEU.  Patient was not  on any anti-coagulation prior to this event.    Goal of Therapy:  Heparin level 0.3-0.7 units/ml Monitor platelets by anticoagulation protocol: Yes   Plan:  Give 4000  units bolus x 1 Start heparin infusion at 1300 units/hr Check anti-Xa level in 6-8 hours and daily while on heparin Continue to monitor H&H and platelets  Despina Pole 04/24/2018,5:19 PM

## 2018-04-24 NOTE — ED Provider Notes (Addendum)
[facility] EMERGENCY DEPARTMENT Provider Note   CSN: [id number] Arrival date & time: 04/24/18  1333     History   Chief Complaint Chief Complaint  Patient presents with  . Shortness of Breath    HPI KEYONDA BICKLE is a 67 y.o. female.  HPI  The pt is a 67 y/o female - she has hx of asthma - she notes that 3 days ago she started having coughing and SOB and became very tachypneic.  She has had constant symptoms for 3 days which are gradually worsening and not associated with fevers or chills or swelling of the legs.  She denies estrogen use, she denies travel, trauma, immobilization.  She is scheduled to have a endoscopy coming up in the next couple of weeks because of some dysphasia.  The patient is started to develop some pain in the right posterior lateral chest associated with breathing and coughing.  She feels like there is phlegm but it is not coming up.  She has had very little to eat in the last several days.  She has never had an acute coronary syndrome and has never had blood clot, aortic dissection or pneumothorax.  She has not had any medicines for her symptoms prior to arrival other than her albuterol which she takes for her asthma as both an inhaler and a nebulizer.  This has not helped that much.  Past Medical History:  Diagnosis Date  . Asthma   . Carpal tunnel syndrome   . Hypertension     Patient Active Problem List   Diagnosis Date Noted  . Routine screening for STI (sexually transmitted infection) 03/14/2016    Past Surgical History:  Procedure Laterality Date  . ABDOMINAL HYSTERECTOMY    . biopsy of right breast    . FOOT SURGERY    . implant left eye    . INTRAOCULAR LENS INSERTION       OB History    Gravida  4   Para  4   Term  4   Preterm      AB      Living        SAB      TAB      Ectopic      Multiple      Live Births               Home Medications    Prior to Admission medications   Medication Sig Start Date End  Date Taking? Authorizing Provider  albuterol (PROVENTIL HFA;VENTOLIN HFA) 108 (90 BASE) MCG/ACT inhaler Inhale 2 puffs into the lungs every 6 (six) hours as needed for wheezing.   Yes [provider]  amLODipine (NORVASC) 5 MG tablet Take 5 mg by mouth daily.   Yes [provider]  chlorthalidone (HYGROTON) 25 MG tablet Take 25 mg by mouth daily.   Yes [provider]  Fluticasone-Salmeterol (ADVAIR) 100-50 MCG/DOSE AEPB Inhale 1 puff into the lungs 2 (two) times daily.   Yes [provider]  losartan (COZAAR) 100 MG tablet Take 1 tablet (100 mg total) by mouth daily. 02/11/15  Yes Rolland Porter, MD  meloxicam (MOBIC) 7.5 MG tablet Take 1 tablet by mouth 2 (two) times daily. 03/26/18  Yes [provider]  metFORMIN (GLUCOPHAGE) 500 MG tablet Take 500 mg by mouth daily as needed.    Yes [provider]  pantoprazole (PROTONIX) 40 MG tablet Take 40 mg by mouth daily.   Yes [provider]  Family History Family History  Problem Relation Age of Onset  . Heart failure Mother   . Stroke Mother   . Kidney failure Other     Social History Social History   Tobacco Use  . Smoking status: Never Smoker  . Smokeless tobacco: Never Used  Substance Use Topics  . Alcohol use: Yes    Comment: occ  . Drug use: No     Allergies   Banana and Aspirin   Review of Systems Review of Systems  All other systems reviewed and are negative.    Physical Exam Updated Vital Signs BP 111/70 (BP Location: Left Arm)   Pulse (!) 123   Temp (!) 103.2 F (39.6 C) (Oral)   Resp (!) 28   Ht 1.676 m (5\' 6" )   Wt 90 kg   SpO2 99%   BMI 32.02 kg/m   Physical Exam  Constitutional: She appears well-developed and well-nourished. She appears distressed.  HENT:  Head: Normocephalic and atraumatic.  Mouth/Throat: Oropharynx is clear and moist. No oropharyngeal exudate.  OP is clear - no redness, no drainage, no exudate and no swelling.  Eyes:  Pupils are equal, round, and reactive to light. Conjunctivae and EOM are normal. Right eye exhibits no discharge. Left eye exhibits no discharge. No scleral icterus.  Neck: Normal range of motion. Neck supple. No JVD present. No thyromegaly present.  Cardiovascular: Regular rhythm, normal heart sounds and intact distal pulses. Exam reveals no gallop and no friction rub.  No murmur heard. Tachycardic to 125 bpm  Pulmonary/Chest: Tachypnea noted. She is in respiratory distress. She has no wheezes. She has rhonchi in the right lower field. She has rales in the right lower field.  There is increased work of breathing, there are some rales and ronchi at the R base and latearal lung - there is no abnormal L sided lung sounds and no wheezing.    Abdominal: Soft. Bowel sounds are normal. She exhibits no distension and no mass. There is no tenderness.  Musculoskeletal: Normal range of motion. She exhibits no edema or tenderness.       Right lower leg: She exhibits no tenderness and no edema.       Left lower leg: She exhibits no tenderness and no edema.  Lymphadenopathy:    She has no cervical adenopathy.  Neurological: She is alert. Coordination normal.  Skin: Skin is warm and dry. No rash noted. No erythema.  Psychiatric: She has a normal mood and affect. Her behavior is normal.  Nursing note and vitals reviewed.    ED Treatments / Results  Labs (all labs ordered are listed, but only abnormal results are displayed) Labs Reviewed  CBC WITH DIFFERENTIAL/PLATELET - Abnormal; Notable for the following components:      Result Value   WBC 17.6 (*)    Hemoglobin 11.6 (*)    Neutro Abs 14.6 (*)    Monocytes Absolute 1.9 (*)    Abs Immature Granulocytes 0.13 (*)    All other components within normal limits  COMPREHENSIVE METABOLIC PANEL - Abnormal; Notable for the following components:   Sodium 130 (*)    Potassium 2.9 (*)    Chloride 93 (*)    Glucose, Bld 109 (*)    Creatinine, Ser 1.03 (*)      AST 54 (*)    Alkaline Phosphatase 188 (*)    Total Bilirubin 1.9 (*)    GFR calc non Af Amer 56 (*)    All other components within  normal limits  D-DIMER, QUANTITATIVE (NOT AT [facility]) - Abnormal; Notable for the following components:   D-Dimer, Quant 14.76 (*)    All other components within normal limits  CULTURE, BLOOD (ROUTINE X 2)  CULTURE, BLOOD (ROUTINE X 2)  LACTIC ACID, PLASMA  URINALYSIS, ROUTINE W REFLEX MICROSCOPIC  LACTIC ACID, PLASMA    EKG EKG Interpretation  Date/Time:  Thursday April 24 2018 13:45:50 EST Ventricular Rate:  125 PR Interval:    QRS Duration: 71 QT Interval:  285 QTC Calculation: 411 R Axis:   22 Text Interpretation:  Sinus tachycardia Low voltage, extremity and precordial leads Abnormal R-wave progression, early transition Borderline repolarization abnormality Baseline wander in lead(s) II V1 V2 V3 V4 V5 V6 Since last tracing rate faster Nonspecific T wave abnormality now present. Confirmed by Noemi Chapel [id number]) on 04/24/2018 1:59:12 PM   Radiology Dg Chest 2 View  Result Date: 04/24/2018 CLINICAL DATA:  Productive cough, shortness of breath, RIGHT-side chest pain and back pain for 2 days, history asthma, hypertension EXAM: CHEST - 2 VIEW COMPARISON:  08/05/2008 FINDINGS: Normal heart size and mediastinal contours. Minimal fluid or thickening at the minor fissure. RIGHT lower lobe infiltrate consistent with pneumonia. Remaining lungs clear. No pleural effusion or pneumothorax. IMPRESSION: RIGHT lower lobe pneumonia. Electronically Signed   By: Lavonia Dana M.D.   On: 04/24/2018 15:03    Procedures .Critical Care Performed by: Noemi Chapel, MD Authorized by: Noemi Chapel, MD   Critical care provider statement:    Critical care time (minutes):  35   Critical care time was exclusive of:  Separately billable procedures and treating other patients and teaching time   Critical care was necessary to treat or prevent imminent or  life-threatening deterioration of the following conditions:  Respiratory failure, sepsis and circulatory failure   Critical care was time spent personally by me on the following activities:  Blood draw for specimens, development of treatment plan with patient or surrogate, discussions with consultants, evaluation of patient's response to treatment, examination of patient, obtaining history from patient or surrogate, ordering and performing treatments and interventions, ordering and review of laboratory studies, ordering and review of radiographic studies, pulse oximetry, re-evaluation of patient's condition and review of old charts   (including critical care time)  Medications Ordered in ED Medications  albuterol (PROVENTIL,VENTOLIN) solution continuous neb (10 mg/hr Nebulization New Bag/Given 04/24/18 1411)  cefTRIAXone (ROCEPHIN) 2 g in sodium chloride 0.9 % 100 mL IVPB (0 g Intravenous Stopped 04/24/18 1520)  azithromycin (ZITHROMAX) 500 mg in sodium chloride 0.9 % 250 mL IVPB (has no administration in time range)  sodium chloride 0.9 % bolus 2,700 mL (has no administration in time range)  potassium chloride 10 mEq in 100 mL IVPB (has no administration in time range)  magnesium sulfate IVPB 2 g 50 mL (has no administration in time range)  acetaminophen (TYLENOL) tablet 1,000 mg (1,000 mg Oral Given 04/24/18 1420)     Initial Impression / Assessment and Plan / ED Course  I have reviewed the triage vital signs and the nursing notes.  Pertinent labs & imaging results that were available during my care of the patient were reviewed by me and considered in my medical decision making (see chart for details).  Clinical Course as of Apr 24 2118  Thu Apr 24, 2018  1430 Laboratory work-up confirms a leukocytosis of 17,600 with a neutrophil predominance, hyponatremia to 130, hypokalemia of 2.9, hypochloremia of 93 with a normal CO2, creatinine of 1, glucose is  normal, total bilirubin is 1.9 and  d-dimer is 14.76.   [BM]  1703 CT scan unfortuntabely    [BM]    Clinical Course User Index [BM] Noemi Chapel, MD    The pt is febrile to 103, tachycardic to 120 and has abnormal lungs sounds that raise suspicion for pneumonia / sepsis - sepsis order set used, lactic acid and cultures pending, xray pending, antibiotics ordered - the pt is not hypotensive, lactic acid pending.   Lactic acid is 1.2, chest x-ray according to my interpretation is that there is a large right lower lobe pneumonia, this appears to be lobar, there is no pneumothorax  On repeat exam the patient is still very tachypneic, she is breathing approximately 30 breaths/min, her lung sounds are decreased on the right consistent with the area of pneumonia.  The patient has been given antibiotics to cover for community-acquired pneumonia and 30 cc/kg for IV fluids to cover for sepsis.  Because she has aspiratory distress she will need to be admitted to the hospital for her critical appearing illness. Vitals:   04/24/18 1411 04/24/18 1420 04/24/18 1430 04/24/18 1454  BP:   107/70   Pulse:  (!) 113 (!) 114 (!) 129  Resp:  (!) 25 (!) 26 (!) 27  Temp:      TempSrc:      SpO2: 95% 100% 100% 100%  Weight:      Height:        Last checked BP is 800 systolic and pulse is around 130 while on nebs - continues with resp distress  K replaced - Magnesium given  This was discussed with the patient was in agreement  Hospitalist paged at 3:20 PM  Returned page at 3:37, will admit.  The CT scan actually confirms that the patient has a large pulmonary embolism with a large area of necrotic lung in the right lower lobe most likely.  She also has what appears to be a cancer in the left lower lung and some lymphadenopathy which could be consistent with a new cancer.  Because of the patient's unstable state, her high fever in the presence of a pulmonary embolism and pulmonary changes we will treat her for both sepsis, shock and for  pulmonary embolism.  She is critically ill.  I discussed the case with Dr. Chase Caller at [facility] intensive care unit who has accepted care of the patient in transfer  Heparin drip started but the patient was informed of the findings  Final Clinical Impressions(s) / ED Diagnoses   Final diagnoses:  Sepsis with acute respiratory failure without septic shock, due to unspecified organism, unspecified whether hypoxia or hypercapnia present [facility])  Community acquired pneumonia of right lower lobe of lung ([place])  Hypokalemia  Hyponatremia  Multiple subsegmental pulmonary emboli without acute cor pulmonale      Noemi Chapel, MD 04/24/18 1538    Noemi Chapel, MD 04/24/18 2121

## 2018-04-25 ENCOUNTER — Inpatient Hospital Stay (HOSPITAL_COMMUNITY): Payer: Medicare HMO

## 2018-04-25 ENCOUNTER — Other Ambulatory Visit: Payer: Self-pay

## 2018-04-25 DIAGNOSIS — R0602 Shortness of breath: Secondary | ICD-10-CM

## 2018-04-25 DIAGNOSIS — I2699 Other pulmonary embolism without acute cor pulmonale: Secondary | ICD-10-CM

## 2018-04-25 DIAGNOSIS — I361 Nonrheumatic tricuspid (valve) insufficiency: Secondary | ICD-10-CM

## 2018-04-25 LAB — BASIC METABOLIC PANEL
Anion gap: 11 (ref 5–15)
BUN: 11 mg/dL (ref 8–23)
CO2: 19 mmol/L — ABNORMAL LOW (ref 22–32)
Calcium: 8.1 mg/dL — ABNORMAL LOW (ref 8.9–10.3)
Chloride: 106 mmol/L (ref 98–111)
Creatinine, Ser: 0.99 mg/dL (ref 0.44–1.00)
GFR calc Af Amer: >60 mL/min (ref >60)
GFR calc non Af Amer: 59 mL/min — ABNORMAL LOW (ref >60)
Glucose, Bld: 161 mg/dL — ABNORMAL HIGH (ref 70–99)
Potassium: 3.5 mmol/L (ref 3.5–5.1)
Sodium: 136 mmol/L (ref 135–145)

## 2018-04-25 LAB — ECHOCARDIOGRAM COMPLETE
Height: 66 in
Weight: 3174.62 oz

## 2018-04-25 LAB — EXPECTORATED SPUTUM ASSESSMENT W GRAM STAIN, RFLX TO RESP C

## 2018-04-25 LAB — CBC
HCT: 27.5 % — ABNORMAL LOW (ref 36.0–46.0)
HEMOGLOBIN: 9 g/dL — AB (ref 12.0–15.0)
MCH: 27 pg (ref 26.0–34.0)
MCHC: 32.7 g/dL (ref 30.0–36.0)
MCV: 82.6 fL (ref 80.0–100.0)
Platelets: 276 10*3/uL (ref 150–400)
RBC: 3.33 MIL/uL — ABNORMAL LOW (ref 3.87–5.11)
RDW: 13.3 % (ref 11.5–15.5)
WBC: 16.7 10*3/uL — ABNORMAL HIGH (ref 4.0–10.5)
nRBC: 0 % (ref 0.0–0.2)

## 2018-04-25 LAB — TROPONIN I
Troponin I: <0.03 ng/mL (ref <0.03)
Troponin I: <0.03 ng/mL (ref <0.03)

## 2018-04-25 LAB — HEPARIN LEVEL (UNFRACTIONATED)
Heparin Unfractionated: <0.1 IU/mL — ABNORMAL LOW (ref 0.30–0.70)
Heparin Unfractionated: 0.32 IU/mL (ref 0.30–0.70)
Heparin Unfractionated: 0.2 IU/mL — ABNORMAL LOW (ref 0.30–0.70)

## 2018-04-25 LAB — GLUCOSE, CAPILLARY
Glucose-Capillary: 107 mg/dL — ABNORMAL HIGH (ref 70–99)
Glucose-Capillary: 110 mg/dL — ABNORMAL HIGH (ref 70–99)
Glucose-Capillary: 96 mg/dL (ref 70–99)

## 2018-04-25 LAB — EXPECTORATED SPUTUM ASSESSMENT W REFEX TO RESP CULTURE

## 2018-04-25 MED ORDER — HEPARIN BOLUS VIA INFUSION
3000.0000 [IU] | Freq: Once | INTRAVENOUS | Status: AC
  Administered 2018-04-25: 3000 [IU] via INTRAVENOUS
  Filled 2018-04-25: qty 3000

## 2018-04-25 MED ORDER — ACETAMINOPHEN 325 MG PO TABS
650.0000 mg | ORAL_TABLET | Freq: Four times a day (QID) | ORAL | Status: DC | PRN
  Administered 2018-04-25 – 2018-04-28 (×5): 650 mg via ORAL
  Filled 2018-04-25 (×5): qty 2

## 2018-04-25 MED ORDER — HEPARIN BOLUS VIA INFUSION
2000.0000 [IU] | Freq: Once | INTRAVENOUS | Status: AC
  Administered 2018-04-25: 2000 [IU] via INTRAVENOUS
  Filled 2018-04-25: qty 2000

## 2018-04-25 MED ORDER — INSULIN ASPART 100 UNIT/ML ~~LOC~~ SOLN: 0.0000 [IU] | Freq: Every day | SUBCUTANEOUS | Status: DC

## 2018-04-25 MED ORDER — INSULIN ASPART 100 UNIT/ML ~~LOC~~ SOLN
0.0000 [IU] | Freq: Three times a day (TID) | SUBCUTANEOUS | Status: DC
  Administered 2018-04-27: 2 [IU] via SUBCUTANEOUS

## 2018-04-25 NOTE — Progress Notes (Signed)
[place] for heparin dosing Indication: pulmonary embolus  Allergies  Allergen Reactions  . Banana Anaphylaxis  . Aspirin Other (See Comments)    G.I. Upset     Patient Measurements: Height: 5\' 6"  (167.6 cm) Weight: 198 lb 6.6 oz (90 kg) IBW/kg (Calculated) : 59.3 Heparin Dosing Weight: HEPARIN DW (KG): 78.9  Vital Signs: Temp: 100.7 F (38.2 C) (11/29 0400) Temp Source: Oral (11/29 0400) BP: 135/85 (11/29 0600) Pulse Rate: 108 (11/29 0600)  Labs: Recent Labs    04/24/18 1347 04/24/18 1822 04/24/18 2314 04/25/18 0539  HGB 11.6*  --   --  9.0*  HCT 36.2  --   --  27.5*  PLT 377  --   --  276  HEPARINUNFRC  --  0.69  --  <0.10*  CREATININE 1.03*  --   --   --   TROPONINI  --   --  <0.03  --     Estimated Creatinine Clearance: 59.9 mL/min (A) (by C-G formula based on SCr of 1.03 mg/dL (H)).   Medical History: Past Medical History:  Diagnosis Date  . Asthma   . Carpal tunnel syndrome   . Hypertension      Assessment: Pharmacy consulted to dose heparin infusion for this 55 yof with acute multi-focal PE.   D-dimer is 14.76 mcg/mL-FEU.  Patient was not  on any anti-coagulation prior to this event. Heparin level this am <0.1 units/ml.  No issues with infusion per RN    Goal of Therapy:  Heparin level 0.3-0.7 units/ml Monitor platelets by anticoagulation protocol: Yes   Plan:  Bolus 3000 unit heparin and increase heparin drip to 1700 units/hr Check heparin level later today  Aamira Bischoff P[place]et 04/25/2018,7:00 AM

## 2018-04-25 NOTE — Progress Notes (Signed)
[place] for heparin dosing Indication: pulmonary embolus  Allergies  Allergen Reactions  . Banana Anaphylaxis  . Aspirin Other (See Comments)    G.I. Upset     Patient Measurements: Height: 5\' 6"  (167.6 cm) Weight: 198 lb 6.6 oz (90 kg) IBW/kg (Calculated) : 59.3 HEPARIN DW (KG): 78.9  Vital Signs: Temp: 99.8 F (37.7 C) (11/29 1540) Temp Source: Oral (11/29 1540) BP: 108/68 (11/29 1900) Pulse Rate: 101 (11/29 1900)  Labs: Recent Labs    04/24/18 1347  04/24/18 2314 04/25/18 0539 04/25/18 1214 04/25/18 1834  HGB 11.6*  --   --  9.0*  --   --   HCT 36.2  --   --  27.5*  --   --   PLT 377  --   --  276  --   --   HEPARINUNFRC  --    < >  --  <0.10* 0.32 0.20*  CREATININE 1.03*  --   --  0.99  --   --   TROPONINI  --   --  <0.03 <0.03 <0.03  --    < > = values in this interval not displayed.    Estimated Creatinine Clearance: 62.3 mL/min (by C-G formula based on SCr of 0.99 mg/dL).   Medical History: Past Medical History:  Diagnosis Date  . Asthma   . Carpal tunnel syndrome   . Hypertension      Assessment: Pharmacy consulted to dose heparin infusion for this 62 yof with acute multi-focal PE, + b/l DVT. D-dimer is 14.76 mcg/mL-FEU.  Patient was not on any anti-coagulation prior to this event.  Heparin level fell this evening 0.2 < goal, Heparin drip 1750 uts/hr - IV line running ok and no swelling of arm.  No signs/symptoms of bleeding.     Goal of Therapy:  Heparin level 0.3-0.7 units/ml Monitor platelets by anticoagulation protocol: Yes   Plan:  Heparin bolus 2000 utsIV x1 Then increase heparin drip 1900 units/hr  Check anti-Xa level  daily while on heparin Continue to monitor H&H and platelets   Bonnita Nasuti Pharm.D. CPP, BCPS Clinical Pharmacist [phone] 04/25/2018 8:08 PM

## 2018-04-25 NOTE — Plan of Care (Signed)
  Problem: Clinical Measurements: Goal: Ability to maintain clinical measurements within normal limits will improve Outcome: Progressing Goal: Diagnostic test results will improve Outcome: Progressing Goal: Respiratory complications will improve Outcome: Progressing Goal: Cardiovascular complication will be avoided Outcome: Progressing   Problem: Activity: Goal: Risk for activity intolerance will decrease Outcome: Progressing   

## 2018-04-25 NOTE — Progress Notes (Signed)
  Echocardiogram 2D Echocardiogram has been performed.  Darlina Sicilian M 04/25/2018, 11:37 AM

## 2018-04-25 NOTE — Progress Notes (Signed)
Preliminary notes---Bilateral lower extremities venous duplex exam completed.   Right: Findings consistent with age indeterminate deep vein thrombosis involving the right posterior tibial vein, and right peroneal vein. No cystic structure found in the popliteal fossa.  Left: Findings consistent with age indeterminate deep vein thrombosis involving the left posterior tibial vein, and left peroneal vein. No cystic structure found in the popliteal fossa.  Kaitlin [name] H Raymona Boss(RDMS RVT) 04/25/18 3:27 PM

## 2018-04-25 NOTE — Progress Notes (Signed)
[place] for heparin dosing Indication: pulmonary embolus  Allergies  Allergen Reactions  . Banana Anaphylaxis  . Aspirin Other (See Comments)    G.I. Upset     Patient Measurements: Height: 5\' 6"  (167.6 cm) Weight: 198 lb 6.6 oz (90 kg) IBW/kg (Calculated) : 59.3 HEPARIN DW (KG): 78.9  Vital Signs: Temp: 99.5 F (37.5 C) (11/29 1146) Temp Source: Oral (11/29 1146) BP: 94/55 (11/29 1300) Pulse Rate: 95 (11/29 1300)  Labs: Recent Labs    04/24/18 1347 04/24/18 1822 04/24/18 2314 04/25/18 0539 04/25/18 1214  HGB 11.6*  --   --  9.0*  --   HCT 36.2  --   --  27.5*  --   PLT 377  --   --  276  --   HEPARINUNFRC  --  0.69  --  <0.10* 0.32  CREATININE 1.03*  --   --  0.99  --   TROPONINI  --   --  <0.03 <0.03 <0.03    Estimated Creatinine Clearance: 62.3 mL/min (by C-G formula based on SCr of 0.99 mg/dL).   Medical History: Past Medical History:  Diagnosis Date  . Asthma   . Carpal tunnel syndrome   . Hypertension      Assessment: Pharmacy consulted to dose heparin infusion for this 46 yof with acute multi-focal PE. D-dimer is 14.76 mcg/mL-FEU.  Patient was not on any anti-coagulation prior to this event.  Heparin level just therapeutic at 0.32 on heparin 1700 units/hr s/p bolus 3000 units.  No signs/symptoms of bleeding or issues with infusion per RN    Goal of Therapy:  Heparin level 0.3-0.7 units/ml Monitor platelets by anticoagulation protocol: Yes   Plan:  Increase heparin drip slightly to 1750 units/hr to ensure heparin level does not trend back to subtherapeutic  Check anti-Xa level in 6 hours and daily while on heparin Continue to monitor H&H and platelets   Claiborne Billings, PharmD PGY2 Cardiology Pharmacy Resident Phone [phone] Please check AMION for all Pharmacist numbers by unit 04/25/2018 1:40 PM

## 2018-04-25 NOTE — Progress Notes (Signed)
NAME:  Kaitlin [name], MRN:  [id number], DOB:  1950/08/08, LOS: 1 ADMISSION DATE:  04/24/2018, CONSULTATION DATE:  04/24/18 REFERRING MD:  Hyacinth Meeker  CHIEF COMPLAINT:  Dyspnea   Brief History   Kaitlin [name] is a 67 y.o. female who was transferred from [facility] with large right PE and LLL mass.  History of present illness   Kaitlin [name] is a 66 y.o. female who has a PMH of HTN, Asthma, Carpal Tunnel.  She presented to [facility] ED 11/28 with dyspnea x 3 days.  Symptoms gradually worsened to the point that she started to get tachypneic.  She also complained of subjective fevers and chills, dry cough, and right sided pleuritic chest pain.  She tried using her albuterol inhaler and nebulizer at home but got minimal relief.  She did not notice any hemoptysis until she was in the ED when she had 1 -2 episodes of blood tinged sputum.  Denies lightheadedness, dizziness, LE edema, tobacco use.  No recent exposures to known sick contacts.  In ED, she had CTA of the chest that demonstrated large right sided PE with possible RLL infarct vs infiltrate as well as a  LLL 3.8 x 3.7 x 2.1cm mass with hilar and mediastinal adenopathy.  She was subsequently transferred to [facility] for further evaluation and management.  She denied any hx of prior malignancy, VTE, recent travel / periods of prolonged immobilization, hormone use.  Past Medical History  HTN, Asthma, Dysphagia, Carpal Tunnel.  Significant Hospital Events   11/28 > admit.  Consults:  None.  Procedures:  None.  Significant Diagnostic Tests:  CTA chest 11/28 >  large right sided PE with possible RLL infarct vs infiltrate.  LLL 3.8 x 3.7 x 2.1cm mass with hilar and mediastinal adenopathy.  Micro Data:  Blood 11/28 >  Sputum 11/28 > unaceptable  Antimicrobials:  Azithromycin 11/28 >  Ceftriaxone 11/28 >    Interim history/subjective:  Reports dyspnea is mildly improved today. Still with pleuritic chest pain with coughing.  Objective:  Blood pressure (!)  104/54, pulse 93, temperature (!) 101.5 F (38.6 C), temperature source Oral, resp. rate 20, height 5\' 6"  (1.676 m), weight 90 kg, SpO2 100 %.        Intake/Output Summary (Last 24 hours) at 04/25/2018 1016 Last data filed at 04/25/2018 1000 Gross per 24 hour  Intake 5143.5 ml  Output 1301 ml  Net 3842.5 ml   Filed Weights   04/24/18 1340  Weight: 90 kg    Examination: General: Adult female, in NAD. Pleasant. Neuro: A&O x 3, no deficits. HEENT: [place]/AT. Sclerae anicteric.  EOMI. Cardiovascular: RRR, no M/R/G.  Lungs: Respirations shallow but unlabored.  CTA bilaterally, No W/R/R. Abdomen: Obese, BS x 4, soft, NT/ND.  Musculoskeletal: No gross deformities, no edema.  Skin: Intact, warm, no rashes.  Assessment & Plan:   Large right sided PE with possible RLL infarct vs infiltrate - currently stable on 2L O2 via [place]. - Continue heparin gtt - Trend troponins. - F/u on echo, LE duplex.  Pleuritic chest pain - due to above +/- CAP. - Low dose morphine ordered.  Possible CAP. - Continue empiric ceftriaxone / azithromycin. - Follow cultures.  LLL mass with hilar and mediastinal adenopathy - concerning for malignancy. Denies prior history of cancer or smoking. Has not had age-appropriate cancer screenings to date per patient report. - PET Skull to Thigh ordered; NPO after MN tonight in preparation - Pending PET results, may need EBUS versus other  tissue sampling if distant disease outside of thorax - Will need outpatient f/u with oncology.  Hx asthma. - Continue BD's.  Hyponatremia - presumed hypovolemic.  Resolved  Hypokalemia. Resolved. - Follow BMP.  AKI - Resolved - Change fluids to KVO - Follow BMP.  Dysphagia - seen by GI who plans to perform a maloney dilation as an outpatient. - F/u with GI as outpatient.  DM - SSI  Best Practice:  Diet: Heart healthy. Pain/Anxiety/Delirium protocol (if indicated): N/A. VAP protocol (if indicated): N/A. DVT prophylaxis:  Heparin gtt. GI prophylaxis: N/A. Glucose control: N/A. Mobility: Bedrest. Code Status: Full. Family Communication: None available. Disposition: ICU.  Labs   CBC: Recent Labs  Lab 04/24/18 1347 04/25/18 0539  WBC 17.6* 16.7*  NEUTROABS 14.6*  --   HGB 11.6* 9.0*  HCT 36.2 27.5*  MCV 83.8 82.6  PLT 377 276   Basic Metabolic Panel: Recent Labs  Lab 04/24/18 1347 04/25/18 0539  NA 130* 136  K 2.9* 3.5  CL 93* 106  CO2 23 19*  GLUCOSE 109* 161*  BUN 18 11  CREATININE 1.03* 0.99  CALCIUM 8.9 8.1*   GFR: Estimated Creatinine Clearance: 62.3 mL/min (by C-G formula based on SCr of 0.99 mg/dL). Recent Labs  Lab 04/24/18 1347 04/24/18 1414 04/24/18 1601 04/25/18 0539  WBC 17.6*  --   --  16.7*  LATICACIDVEN  --  1.2 3.5*  --    Liver Function Tests: Recent Labs  Lab 04/24/18 1347  AST 54*  ALT 44  ALKPHOS 188*  BILITOT 1.9*  PROT 7.8  ALBUMIN 3.5   No results for input(s): LIPASE, AMYLASE in the last 168 hours. No results for input(s): AMMONIA in the last 168 hours. ABG No results found for: PHART, PCO2ART, PO2ART, HCO3, TCO2, ACIDBASEDEF, O2SAT  Coagulation Profile: No results for input(s): INR, PROTIME in the last 168 hours. Cardiac Enzymes: Recent Labs  Lab 04/24/18 2314 04/25/18 0539  TROPONINI <0.03 <0.03   HbA1C: Hgb A1c MFr Bld  Date/Time Value Ref Range Status  09/05/2012 05:00 AM 7.1 (H) <5.7 % Final    Comment:    (NOTE)                                                                       According to the ADA Clinical Practice Recommendations for 2011, when HbA1c is used as a screening test:  >=6.5%   Diagnostic of Diabetes Mellitus           (if abnormal result is confirmed) 5.7-6.4%   Increased risk of developing Diabetes Mellitus References:Diagnosis and Classification of Diabetes Mellitus,Diabetes Care,2011,34(Suppl 1):S62-S69 and Standards of Medical Care in         Diabetes - 2011,Diabetes Care,2011,34 (Suppl 1):S11-S61.    CBG: No results for input(s): GLUCAP in the last 168 hours.  Review of Systems:   All negative; except for those that are bolded, which indicate positives.  Constitutional: weight loss, weight gain, night sweats, fevers, chills, fatigue, weakness.  HEENT: headaches, sore throat, sneezing, nasal congestion, post nasal drip, difficulty swallowing, tooth/dental problems, visual complaints, visual changes, ear aches. Neuro: difficulty with speech, weakness, numbness, ataxia. CV:  chest pain, orthopnea, PND, swelling in lower extremities, dizziness, palpitations, syncope.  Resp: cough,  hemoptysis, dyspnea, wheezing. GI: heartburn, indigestion, abdominal pain, nausea, vomiting, diarrhea, constipation, change in bowel habits, loss of appetite, hematemesis, melena, hematochezia.  GU: dysuria, change in color of urine, urgency or frequency, flank pain, hematuria. MSK: joint pain or swelling, decreased range of motion. Psych: change in mood or affect, depression, anxiety, suicidal ideations, homicidal ideations. Skin: rash, itching, bruising.   Past medical history  She,  has a past medical history of Asthma, Carpal tunnel syndrome, and Hypertension.   Surgical History    Past Surgical History:  Procedure Laterality Date  . ABDOMINAL HYSTERECTOMY    . biopsy of right breast    . FOOT SURGERY    . implant left eye    . INTRAOCULAR LENS INSERTION       Social History   reports that she has never smoked. She has never used smokeless tobacco. She reports that she drinks alcohol. She reports that she does not use drugs.   Family history   Her family history includes Heart failure in her mother; Kidney failure in her other; Stroke in her mother.   Allergies Allergies  Allergen Reactions  . Banana Anaphylaxis  . Aspirin Other (See Comments)    G.I. Upset      Home meds  Prior to Admission medications   Medication Sig Start Date End Date Taking? Authorizing Provider  albuterol  (PROVENTIL HFA;VENTOLIN HFA) 108 (90 BASE) MCG/ACT inhaler Inhale 2 puffs into the lungs every 6 (six) hours as needed for wheezing.   Yes [provider]  amLODipine (NORVASC) 5 MG tablet Take 5 mg by mouth daily.   Yes [provider]  chlorthalidone (HYGROTON) 25 MG tablet Take 25 mg by mouth daily.   Yes [provider]  Fluticasone-Salmeterol (ADVAIR) 100-50 MCG/DOSE AEPB Inhale 1 puff into the lungs 2 (two) times daily.   Yes [provider]  losartan (COZAAR) 100 MG tablet Take 1 tablet (100 mg total) by mouth daily. 02/11/15  Yes Devoria Albe, MD  meloxicam (MOBIC) 7.5 MG tablet Take 1 tablet by mouth 2 (two) times daily. 03/26/18  Yes [provider]  metFORMIN (GLUCOPHAGE) 500 MG tablet Take 500 mg by mouth daily as needed.    Yes [provider]  pantoprazole (PROTONIX) 40 MG tablet Take 40 mg by mouth daily.   Yes [provider]     Morene Antu, MD [facility] PCCM

## 2018-04-26 DIAGNOSIS — J181 Lobar pneumonia, unspecified organism: Secondary | ICD-10-CM

## 2018-04-26 DIAGNOSIS — I2699 Other pulmonary embolism without acute cor pulmonale: Secondary | ICD-10-CM

## 2018-04-26 LAB — CBC
HCT: 28 % — ABNORMAL LOW (ref 36.0–46.0)
HCT: 29.1 % — ABNORMAL LOW (ref 36.0–46.0)
HEMOGLOBIN: 8.5 g/dL — AB (ref 12.0–15.0)
Hemoglobin: 8.9 g/dL — ABNORMAL LOW (ref 12.0–15.0)
MCH: 26.1 pg (ref 26.0–34.0)
MCH: 25.9 pg — ABNORMAL LOW (ref 26.0–34.0)
MCHC: 30.4 g/dL (ref 30.0–36.0)
MCHC: 30.6 g/dL (ref 30.0–36.0)
MCV: 85.9 fL (ref 80.0–100.0)
MCV: 84.8 fL (ref 80.0–100.0)
PLATELETS: 388 10*3/uL (ref 150–400)
Platelets: 324 10*3/uL (ref 150–400)
RBC: 3.26 MIL/uL — ABNORMAL LOW (ref 3.87–5.11)
RBC: 3.43 MIL/uL — AB (ref 3.87–5.11)
RDW: 13.7 % (ref 11.5–15.5)
RDW: 13.8 % (ref 11.5–15.5)
WBC: 15.6 10*3/uL — ABNORMAL HIGH (ref 4.0–10.5)
WBC: 14.2 10*3/uL — ABNORMAL HIGH (ref 4.0–10.5)
nRBC: 0 % (ref 0.0–0.2)
nRBC: 0 % (ref 0.0–0.2)

## 2018-04-26 LAB — HEPARIN LEVEL (UNFRACTIONATED)
Heparin Unfractionated: 0.39 IU/mL (ref 0.30–0.70)
Heparin Unfractionated: 0.37 IU/mL (ref 0.30–0.70)

## 2018-04-26 LAB — GLUCOSE, CAPILLARY
Glucose-Capillary: 86 mg/dL (ref 70–99)
Glucose-Capillary: 108 mg/dL — ABNORMAL HIGH (ref 70–99)
Glucose-Capillary: 95 mg/dL (ref 70–99)
Glucose-Capillary: 89 mg/dL (ref 70–99)

## 2018-04-26 NOTE — Progress Notes (Signed)
Pt has orders to transfer to room 2W12. Telephone SBAR report given to [place], Therapist, sports.

## 2018-04-26 NOTE — Progress Notes (Addendum)
NAME:  Kaitlin [name], MRN:  [id number], DOB:  Jun 30, 1950, LOS: 2 ADMISSION DATE:  04/24/2018, CONSULTATION DATE:  04/24/18 REFERRING MD:  Hyacinth Meeker  CHIEF COMPLAINT:  Dyspnea   Brief History   Kaitlin FREDERKING is a 67 y.o. female who was transferred from [facility] with large right PE and LLL mass.  History of present illness   Kaitlin [name] is a 67 y.o. female who has a PMH of HTN, Asthma, Carpal Tunnel.  She presented to [facility] ED 11/28 with dyspnea x 3 days.  Symptoms gradually worsened to the point that she started to get tachypneic.  She also complained of subjective fevers and chills, dry cough, and right sided pleuritic chest pain.  She tried using her albuterol inhaler and nebulizer at home but got minimal relief.  She did not notice any hemoptysis until she was in the ED when she had 1 -2 episodes of blood tinged sputum.  Denies lightheadedness, dizziness, LE edema, tobacco use.  No recent exposures to known sick contacts.  In ED, she had CTA of the chest that demonstrated large right sided PE with possible RLL infarct vs infiltrate as well as a  LLL 3.8 x 3.7 x 2.1cm mass with hilar and mediastinal adenopathy.  She was subsequently transferred to [facility] for further evaluation and management.  She denied any hx of prior malignancy, VTE, recent travel / periods of prolonged immobilization, hormone use.  Past Medical History  HTN, Asthma, Dysphagia, Carpal Tunnel.  Significant Hospital Events   11/28 > admit.  Consults:  None.  Procedures:  None.  Significant Diagnostic Tests:  CTA chest 11/28 >  large right sided PE with possible RLL infarct vs infiltrate.  LLL 3.8 x 3.7 x 2.1cm mass with hilar and mediastinal adenopathy. Echo 11/29: largely unremarkable  Micro Data:  Blood 11/28 > NGTD Sputum 11/28 > unaceptable  Antimicrobials:  Azithromycin 11/28 >  Ceftriaxone 11/28 >    Interim history/subjective:  Hgb trending down and 8.5 today from 11.6 at admission. 9.0 yesterday AM. Respiratory  status and hemodynamics stable. Reports breathing is somewhat improved today. Pain with cough and deep inspiration remains stable. No bleeding seen in BMs or urine.  Objective:  Blood pressure 114/69, pulse (!) 102, temperature 99.4 F (37.4 C), temperature source Oral, resp. rate (!) 30, height 5\' 6"  (1.676 m), weight 90 kg, SpO2 100 %.        Intake/Output Summary (Last 24 hours) at 04/26/2018 0931 Last data filed at 04/26/2018 0900 Gross per 24 hour  Intake 2320.33 ml  Output 1475 ml  Net 845.33 ml   Filed Weights   04/24/18 1340  Weight: 90 kg    Examination: General: Adult female, in NAD. Pleasant. Mild tachpynea. Neuro: A&O x 3, no deficits. HEENT: [place]/AT. Sclerae anicteric.  EOMI. Cardiovascular: mild tachycardia to low 100s, no M/R/G.  Lungs: Mild tachypnea, clear lungs Abdomen: Obese, BS x 4, soft, NT/ND.  Musculoskeletal: No gross deformities, no edema.  Skin: Intact, warm, no rashes.  Assessment & Plan:   Large right sided PE with possible RLL infarct vs infiltrate and bilateral LE dvt - currently stable on 2L O2 via [place]. - Continue heparin gtt  Anemia: Hgb trending down on Hep gtt. No evidence of active bleeding. - repeat Hgb this PM  Pleuritic chest pain - due to above +/- CAP. - PRN morphine  Possible CAP. - On ceftriaxone / azithromycin. Likely transition to oral Abx tomorrow if cultures remain negative. - Follow cultures.  NGTD.  LLL mass with hilar and mediastinal adenopathy - concerning for malignancy. Denies prior history of cancer or smoking. Has not had age-appropriate cancer screenings to date per patient report. - Will need PET/CT as outpatient and may need EBUS versus other tissue sampling if distant disease outside of thorax. Need to arrange Pulmonary follow up as outpatient, but would want PE to stabilize prior pursuing procedure. - Needs outpatient referral to Oncology  Hx asthma. - Continue BD's.  Dysphagia - seen by GI who plans to perform  a maloney dilation as an outpatient. - F/u with GI as outpatient. Has appt on 12/13 for EGD.  DM - SSI  Best Practice:  Diet: Heart healthy. Pain/Anxiety/Delirium protocol (if indicated): N/A. VAP protocol (if indicated): N/A. DVT prophylaxis: Heparin gtt. GI prophylaxis: N/A. Glucose control: N/A. Mobility: Bedrest. Code Status: Full. Family Communication: Updated at bedside today. Disposition: Transfer to [facility].  Labs   CBC: Recent Labs  Lab 04/24/18 1347 04/25/18 0539 04/26/18 0335  WBC 17.6* 16.7* 15.6*  NEUTROABS 14.6*  --   --   HGB 11.6* 9.0* 8.5*  HCT 36.2 27.5* 28.0*  MCV 83.8 82.6 85.9  PLT 377 276 324   Basic Metabolic Panel: Recent Labs  Lab 04/24/18 1347 04/25/18 0539  NA 130* 136  K 2.9* 3.5  CL 93* 106  CO2 23 19*  GLUCOSE 109* 161*  BUN 18 11  CREATININE 1.03* 0.99  CALCIUM 8.9 8.1*   GFR: Estimated Creatinine Clearance: 62.3 mL/min (by C-G formula based on SCr of 0.99 mg/dL). Recent Labs  Lab 04/24/18 1347 04/24/18 1414 04/24/18 1601 04/25/18 0539 04/26/18 0335  WBC 17.6*  --   --  16.7* 15.6*  LATICACIDVEN  --  1.2 3.5*  --   --    Liver Function Tests: Recent Labs  Lab 04/24/18 1347  AST 54*  ALT 44  ALKPHOS 188*  BILITOT 1.9*  PROT 7.8  ALBUMIN 3.5   No results for input(s): LIPASE, AMYLASE in the last 168 hours. No results for input(s): AMMONIA in the last 168 hours. ABG No results found for: PHART, PCO2ART, PO2ART, HCO3, TCO2, ACIDBASEDEF, O2SAT  Coagulation Profile: No results for input(s): INR, PROTIME in the last 168 hours. Cardiac Enzymes: Recent Labs  Lab 04/24/18 2314 04/25/18 0539 04/25/18 1214  TROPONINI <0.03 <0.03 <0.03   HbA1C: Hgb A1c MFr Bld  Date/Time Value Ref Range Status  09/05/2012 05:00 AM 7.1 (H) <5.7 % Final    Comment:    (NOTE)                                                                       According to the ADA Clinical Practice Recommendations for 2011, when HbA1c is used  as a screening test:  >=6.5%   Diagnostic of Diabetes Mellitus           (if abnormal result is confirmed) 5.7-6.4%   Increased risk of developing Diabetes Mellitus References:Diagnosis and Classification of Diabetes Mellitus,Diabetes Care,2011,34(Suppl 1):S62-S69 and Standards of Medical Care in         Diabetes - 2011,Diabetes Care,2011,34 (Suppl 1):S11-S61.   CBG: Recent Labs  Lab 04/25/18 1147 04/25/18 1541 04/25/18 2215 04/26/18 0638  GLUCAP 107* 110* 96 86  Review of Systems:   All negative; except for those that are bolded, which indicate positives.  Constitutional: weight loss, weight gain, night sweats, fevers, chills, fatigue, weakness.  HEENT: headaches, sore throat, sneezing, nasal congestion, post nasal drip, difficulty swallowing, tooth/dental problems, visual complaints, visual changes, ear aches. Neuro: difficulty with speech, weakness, numbness, ataxia. CV:  chest pain, orthopnea, PND, swelling in lower extremities, dizziness, palpitations, syncope.  Resp: cough, hemoptysis, dyspnea, wheezing. GI: heartburn, indigestion, abdominal pain, nausea, vomiting, diarrhea, constipation, change in bowel habits, loss of appetite, hematemesis, melena, hematochezia.  GU: dysuria, change in color of urine, urgency or frequency, flank pain, hematuria. MSK: joint pain or swelling, decreased range of motion. Psych: change in mood or affect, depression, anxiety, suicidal ideations, homicidal ideations. Skin: rash, itching, bruising.   Past medical history  She,  has a past medical history of Asthma, Carpal tunnel syndrome, and Hypertension.   Surgical History    Past Surgical History:  Procedure Laterality Date  . ABDOMINAL HYSTERECTOMY    . biopsy of right breast    . FOOT SURGERY    . implant left eye    . INTRAOCULAR LENS INSERTION       Social History   reports that she has never smoked. She has never used smokeless tobacco. She reports that she drinks alcohol.  She reports that she does not use drugs.   Family history   Her family history includes Heart failure in her mother; Kidney failure in her other; Stroke in her mother.   Allergies Allergies  Allergen Reactions  . Banana Anaphylaxis  . Aspirin Other (See Comments)    G.I. Upset      Home meds  Prior to Admission medications   Medication Sig Start Date End Date Taking? Authorizing Provider  albuterol (PROVENTIL HFA;VENTOLIN HFA) 108 (90 BASE) MCG/ACT inhaler Inhale 2 puffs into the lungs every 6 (six) hours as needed for wheezing.   Yes [provider]  amLODipine (NORVASC) 5 MG tablet Take 5 mg by mouth daily.   Yes [provider]  chlorthalidone (HYGROTON) 25 MG tablet Take 25 mg by mouth daily.   Yes [provider]  Fluticasone-Salmeterol (ADVAIR) 100-50 MCG/DOSE AEPB Inhale 1 puff into the lungs 2 (two) times daily.   Yes [provider]  losartan (COZAAR) 100 MG tablet Take 1 tablet (100 mg total) by mouth daily. 02/11/15  Yes Devoria Albe, MD  meloxicam (MOBIC) 7.5 MG tablet Take 1 tablet by mouth 2 (two) times daily. 03/26/18  Yes [provider]  metFORMIN (GLUCOPHAGE) 500 MG tablet Take 500 mg by mouth daily as needed.    Yes [provider]  pantoprazole (PROTONIX) 40 MG tablet Take 40 mg by mouth daily.   Yes [provider]     Morene Antu, MD [facility] PCCM

## 2018-04-26 NOTE — Care Management (Signed)
This is a no charge note  Pending admission per Dr.   Nolene Ebbs up from [facility] per Dr. Ardeen Garland  67 year old lady with past medical history of hypertension, diabetes mellitus, asthma, GERD, who is admitted by PCCM on 11/28 due to large right sided pulmonary embolism, and possible infarction versus infiltration, also found to have left lower lobe mass, concerning for malignancy.  Patient is on IV heparin, antibiotics (Rocephin and azithromycin).  Patient may need outpatient bronchoscopy to work-up for malignancy.  Currently patient is on 2 L oxygen.  Blood pressure 119/66, heart rate 109, respiration rate 28, oxygen saturation 94% on room air, temperature 102.7.  WBC 14.2.  Creatinine normal.  Lactic acid 3.5, negative troponin.   Ivor Costa, MD  [facility] Pager [phone]  If 7PM-7AM, please contact night-coverage [email or web address] Password [email or web address] 04/26/2018, 7:14 PM

## 2018-04-26 NOTE — Progress Notes (Signed)
NAME:  MARCILE OMURA, MRN:  [id number], DOB:  Jan 09, 1951, LOS: 2 ADMISSION DATE:  04/24/2018, CONSULTATION DATE:  04/24/18 REFERRING MD:  Hyacinth Meeker  CHIEF COMPLAINT:  Dyspnea   Brief History   LAIMA BRUNKHORST is a 67 y.o. female who was transferred from [facility] with large right PE and LLL mass.  History of present illness   TANGULA HETMAN is a 67 y.o. female who has a PMH of HTN, Asthma, Carpal Tunnel.  She presented to [facility] ED 11/28 with dyspnea x 3 days.  Symptoms gradually worsened to the point that she started to get tachypneic.  She also complained of subjective fevers and chills, dry cough, and right sided pleuritic chest pain.  She tried using her albuterol inhaler and nebulizer at home but got minimal relief.  She did not notice any hemoptysis until she was in the ED when she had 1 -2 episodes of blood tinged sputum.  Denies lightheadedness, dizziness, LE edema, tobacco use.  No recent exposures to known sick contacts.  In ED, she had CTA of the chest that demonstrated large right sided PE with possible RLL infarct vs infiltrate as well as a  LLL 3.8 x 3.7 x 2.1cm mass with hilar and mediastinal adenopathy.  She was subsequently transferred to [facility] for further evaluation and management.  She denied any hx of prior malignancy, VTE, recent travel / periods of prolonged immobilization, hormone use.  Past Medical History  HTN, Asthma, Dysphagia, Carpal Tunnel.  Significant Hospital Events   11/28 > admit.  Consults:  None.  Procedures:  None.  Significant Diagnostic Tests:  CTA chest 11/28 >  large right sided PE with possible RLL infarct vs infiltrate.  LLL 3.8 x 3.7 x 2.1cm mass with hilar and mediastinal adenopathy. Echocardiogram:Impressions:  - LVEF 60-65%, normal wall thickness, normal wall motion, grade 1   DD, indeterminate LV filling pressure, trivial MR, normal LA   size, normal RV size and systolic function, mild TR, RVSP 35   mmHg, IVC suggests elevated RA pressure of 8 mmHg,  small   pericardial effusion without tamponade. Micro Data:  Blood 11/28 > negative at present Sputum 11/28 > negative at present  Antimicrobials:  Azithromycin 11/28 >  Ceftriaxone 11/28 >    Interim history/subjective:  Currently on room air. still having right sided pleuritic pain. Only wants to take Tylenol, refuses morphine or any opiates  Objective:  Blood pressure 119/66, pulse (!) 109, temperature (!) 102.7 F (39.3 C), temperature source Oral, resp. rate (!) 28, height 5\' 6"  (1.676 m), weight 90 kg, SpO2 94 %.        Intake/Output Summary (Last 24 hours) at 04/26/2018 1756 Last data filed at 04/26/2018 1100 Gross per 24 hour  Intake 1275.31 ml  Output 975 ml  Net 300.31 ml   Filed Weights   04/24/18 1340  Weight: 90 kg    Examination: General: Middle-aged lady, does not appear to be in distress Neuro: Alert and oriented x3, moving all extremities HEENT: Moist oral mucosa Cardiovascular: S1-S2 appreciated Lungs: Respirations shallow but unlabored.  Rales at the bases bilaterally Abdomen: Obese, BS x 4, soft, NT/ND.   Assessment & Plan:   Large right sided PE with possible RLL infarct vs infiltrate -  On room air at present Continue heparin  Pleuritic chest pain - due to above +/- CAP. - Low dose morphine ordered. -Encouraged to consider opiates for pain management however just wants to use Tylenol at present  Possible community-acquired  pneumonia, extensive infiltrates at the base on the right - Continue empiric ceftriaxone / azithromycin. - Follow cultures.-Negative to date -Leukocytosis improving  Hx asthma. - Continue BD's.  LLL mass with hilar and mediastinal adenopathy - concerning for malignancy. - CT guided biopsy might be least invasive means of obtaining tissue sample. - Will need outpatient f/u with oncology.  Hyponatremia - presumed hypovolemic.  Though can not r/o SIADH in setting of lung mass. Continue fluids  AKI  - Resolving  Dysphagia - seen by GI who plans to perform a maloney dilation as an outpatient. - F/u with GI as outpatient.  Will complete course of antibiotics Invasive intervention may be considered after about 3 weeks-anti-coagulation will need to be held for invasive intervention  Best Practice:  Diet: Heart healthy. DVT prophylaxis: Heparin gtt. GI prophylaxis: N/A. Glucose control: N/A. Mobility: Bedrest. Code Status: Full. Disposition: ICU.  Labs   CBC: Recent Labs  Lab 04/24/18 1347 04/25/18 0539 04/26/18 0335 04/26/18 1236  WBC 17.6* 16.7* 15.6* 14.2*  NEUTROABS 14.6*  --   --   --   HGB 11.6* 9.0* 8.5* 8.9*  HCT 36.2 27.5* 28.0* 29.1*  MCV 83.8 82.6 85.9 84.8  PLT 377 276 324 388   Basic Metabolic Panel: Recent Labs  Lab 04/24/18 1347 04/25/18 0539  NA 130* 136  K 2.9* 3.5  CL 93* 106  CO2 23 19*  GLUCOSE 109* 161*  BUN 18 11  CREATININE 1.03* 0.99  CALCIUM 8.9 8.1*   GFR: Estimated Creatinine Clearance: 62.3 mL/min (by C-G formula based on SCr of 0.99 mg/dL). Recent Labs  Lab 04/24/18 1347 04/24/18 1414 04/24/18 1601 04/25/18 0539 04/26/18 0335 04/26/18 1236  WBC 17.6*  --   --  16.7* 15.6* 14.2*  LATICACIDVEN  --  1.2 3.5*  --   --   --    Liver Function Tests: Recent Labs  Lab 04/24/18 1347  AST 54*  ALT 44  ALKPHOS 188*  BILITOT 1.9*  PROT 7.8  ALBUMIN 3.5   No results for input(s): LIPASE, AMYLASE in the last 168 hours. No results for input(s): AMMONIA in the last 168 hours. ABG No results found for: PHART, PCO2ART, PO2ART, HCO3, TCO2, ACIDBASEDEF, O2SAT  Coagulation Profile: No results for input(s): INR, PROTIME in the last 168 hours. Cardiac Enzymes: Recent Labs  Lab 04/24/18 2314 04/25/18 0539 04/25/18 1214  TROPONINI <0.03 <0.03 <0.03   HbA1C: Hgb A1c MFr Bld  Date/Time Value Ref Range Status  09/05/2012 05:00 AM 7.1 (H) <5.7 % Final    Comment:    (NOTE)                                                                        According to the ADA Clinical Practice Recommendations for 2011, when HbA1c is used as a screening test:  >=6.5%   Diagnostic of Diabetes Mellitus           (if abnormal result is confirmed) 5.7-6.4%   Increased risk of developing Diabetes Mellitus References:Diagnosis and Classification of Diabetes Mellitus,Diabetes Care,2011,34(Suppl 1):S62-S69 and Standards of Medical Care in         Diabetes - 2011,Diabetes Care,2011,34 (Suppl 1):S11-S61.   CBG: Recent Labs  Lab 04/25/18 1541 04/25/18  2215 04/26/18 0638 04/26/18 1146 04/26/18 1717  GLUCAP 110* 96 86 108* 95    Review of Systems:   Review of systems significant for pain and discomfort, shortness of breath with activity  Past medical history  She,  has a past medical history of Asthma, Carpal tunnel syndrome, and Hypertension.   Surgical History    Past Surgical History:  Procedure Laterality Date  . ABDOMINAL HYSTERECTOMY    . biopsy of right breast    . FOOT SURGERY    . implant left eye    . INTRAOCULAR LENS INSERTION       Social History   reports that she has never smoked. She has never used smokeless tobacco. She reports that she drinks alcohol. She reports that she does not use drugs.   Family history   Her family history includes Heart failure in her mother; Kidney failure in her other; Stroke in her mother.   Allergies Allergies  Allergen Reactions  . Banana Anaphylaxis  . Aspirin Other (See Comments)    G.I. Upset      Home meds  Prior to Admission medications   Medication Sig Start Date End Date Taking? Authorizing Provider  albuterol (PROVENTIL HFA;VENTOLIN HFA) 108 (90 BASE) MCG/ACT inhaler Inhale 2 puffs into the lungs every 6 (six) hours as needed for wheezing.   Yes [provider]  amLODipine (NORVASC) 5 MG tablet Take 5 mg by mouth daily.   Yes [provider]  chlorthalidone (HYGROTON) 25 MG tablet Take 25 mg by mouth daily.   Yes [provider]   Fluticasone-Salmeterol (ADVAIR) 100-50 MCG/DOSE AEPB Inhale 1 puff into the lungs 2 (two) times daily.   Yes [provider]  losartan (COZAAR) 100 MG tablet Take 1 tablet (100 mg total) by mouth daily. 02/11/15  Yes Devoria Albe, MD  meloxicam (MOBIC) 7.5 MG tablet Take 1 tablet by mouth 2 (two) times daily. 03/26/18  Yes [provider]  metFORMIN (GLUCOPHAGE) 500 MG tablet Take 500 mg by mouth daily as needed.    Yes [provider]  pantoprazole (PROTONIX) 40 MG tablet Take 40 mg by mouth daily.   Yes [provider]     Rutherford Guys, PA - [facility] Pager: [phone]  or [phone] 04/26/2018, 5:56 PM  Agree with assessment and plan as stated above by PA-C Desai.  Downgrade in am likely

## 2018-04-26 NOTE — Progress Notes (Signed)
Patient transferred to room 2W12 on the monitor and 2L of oxygen. Receiving nurse, Merrily Pew was at the bedside to received patient.

## 2018-04-26 NOTE — Progress Notes (Signed)
[place] for heparin dosing Indication: pulmonary embolus  Allergies  Allergen Reactions  . Banana Anaphylaxis  . Aspirin Other (See Comments)    G.I. Upset     Patient Measurements: Height: 5\' 6"  (167.6 cm) Weight: 198 lb 6.6 oz (90 kg) IBW/kg (Calculated) : 59.3 HEPARIN DW (KG): 78.9  Vital Signs: Temp: 98.7 F (37.1 C) (11/30 1130) Temp Source: Oral (11/30 1130) BP: 117/74 (11/30 1130) Pulse Rate: 94 (11/30 1130)  Labs: Recent Labs    04/24/18 1347  04/24/18 2314 04/25/18 0539 04/25/18 1214 04/25/18 1834 04/26/18 0335 04/26/18 1236  HGB 11.6*  --   --  9.0*  --   --  8.5* 8.9*  HCT 36.2  --   --  27.5*  --   --  28.0* 29.1*  PLT 377  --   --  276  --   --  324 388  HEPARINUNFRC  --    < >  --  <0.10* 0.32 0.20* 0.39 0.37  CREATININE 1.03*  --   --  0.99  --   --   --   --   TROPONINI  --   --  <0.03 <0.03 <0.03  --   --   --    < > = values in this interval not displayed.    Estimated Creatinine Clearance: 62.3 mL/min (by C-G formula based on SCr of 0.99 mg/dL).   Medical History: Past Medical History:  Diagnosis Date  . Asthma   . Carpal tunnel syndrome   . Hypertension      Assessment: Pharmacy consulted to dose heparin infusion for this 2 yof with acute multi-focal PE, + b/l DVT. D-dimer is 14.76 mcg/mL-FEU.  Patient was not on any anti-coagulation prior to this event. Hgb low but stable, plts ok, no reports of bleeding, Scr stable  Confirmatory heparin level remains therapeutic 0.37 units/ml   Goal of Therapy:  Heparin level 0.3-0.7 units/ml Monitor platelets by anticoagulation protocol: Yes   Plan:  Continue heparin drip 1900 units/hr  Check anti-Xa level daily while on heparin Continue to monitor H&H, platelets, renal function, and s/sx bleeding   Juanell Fairly, PharmD PGY1 Pharmacy Resident Phone [phone] 04/26/2018 1:45 PM

## 2018-04-26 NOTE — Progress Notes (Signed)
[place] for heparin dosing Indication: pulmonary embolus  Allergies  Allergen Reactions  . Banana Anaphylaxis  . Aspirin Other (See Comments)    G.I. Upset     Patient Measurements: Height: 5\' 6"  (167.6 cm) Weight: 198 lb 6.6 oz (90 kg) IBW/kg (Calculated) : 59.3 HEPARIN DW (KG): 78.9  Vital Signs: Temp: 98.1 F (36.7 C) (11/30 0400) Temp Source: Oral (11/30 0400) BP: 108/66 (11/30 0600) Pulse Rate: 85 (11/30 0600)  Labs: Recent Labs    04/24/18 1347  04/24/18 2314 04/25/18 0539 04/25/18 1214 04/25/18 1834 04/26/18 0335  HGB 11.6*  --   --  9.0*  --   --  8.5*  HCT 36.2  --   --  27.5*  --   --  28.0*  PLT 377  --   --  276  --   --  324  HEPARINUNFRC  --    < >  --  <0.10* 0.32 0.20* 0.39  CREATININE 1.03*  --   --  0.99  --   --   --   TROPONINI  --   --  <0.03 <0.03 <0.03  --   --    < > = values in this interval not displayed.    Estimated Creatinine Clearance: 62.3 mL/min (by C-G formula based on SCr of 0.99 mg/dL).   Medical History: Past Medical History:  Diagnosis Date  . Asthma   . Carpal tunnel syndrome   . Hypertension      Assessment: Pharmacy consulted to dose heparin infusion for this 77 yof with acute multi-focal PE, + b/l DVT. D-dimer is 14.76 mcg/mL-FEU.  Patient was not on any anti-coagulation prior to this event.  Heparin level therapeutic 0.39 units/ml    Goal of Therapy:  Heparin level 0.3-0.7 units/ml Monitor platelets by anticoagulation protocol: Yes   Plan:  Continue heparin drip 1900 units/hr  Check anti-Xa level in 6 hours and  daily while on heparin Continue to monitor H&H and platelets   Excell Seltzer, PharmD Clinical Pharmacist 04/26/2018 6:38 AM

## 2018-04-27 DIAGNOSIS — J9601 Acute respiratory failure with hypoxia: Secondary | ICD-10-CM

## 2018-04-27 LAB — GLUCOSE, CAPILLARY
Glucose-Capillary: 67 mg/dL — ABNORMAL LOW (ref 70–99)
Glucose-Capillary: 93 mg/dL (ref 70–99)
Glucose-Capillary: 83 mg/dL (ref 70–99)
Glucose-Capillary: 145 mg/dL — ABNORMAL HIGH (ref 70–99)
Glucose-Capillary: 85 mg/dL (ref 70–99)

## 2018-04-27 LAB — CBC
HCT: 29.5 % — ABNORMAL LOW (ref 36.0–46.0)
HEMATOCRIT: 28.8 % — AB (ref 36.0–46.0)
HEMOGLOBIN: 9 g/dL — AB (ref 12.0–15.0)
Hemoglobin: 9.2 g/dL — ABNORMAL LOW (ref 12.0–15.0)
MCH: 25.9 pg — ABNORMAL LOW (ref 26.0–34.0)
MCH: 27.1 pg (ref 26.0–34.0)
MCHC: 30.5 g/dL (ref 30.0–36.0)
MCHC: 31.9 g/dL (ref 30.0–36.0)
MCV: 84.8 fL (ref 80.0–100.0)
MCV: 85 fL (ref 80.0–100.0)
NRBC: 0 % (ref 0.0–0.2)
Platelets: 398 10*3/uL (ref 150–400)
Platelets: 402 10*3/uL — ABNORMAL HIGH (ref 150–400)
RBC: 3.48 MIL/uL — AB (ref 3.87–5.11)
RBC: 3.39 MIL/uL — ABNORMAL LOW (ref 3.87–5.11)
RDW: 13.7 % (ref 11.5–15.5)
RDW: 13.7 % (ref 11.5–15.5)
WBC: 13.2 10*3/uL — ABNORMAL HIGH (ref 4.0–10.5)
WBC: 13.3 10*3/uL — ABNORMAL HIGH (ref 4.0–10.5)
nRBC: 0 % (ref 0.0–0.2)

## 2018-04-27 LAB — HEPARIN LEVEL (UNFRACTIONATED): Heparin Unfractionated: 0.4 IU/mL (ref 0.30–0.70)

## 2018-04-27 MED ORDER — GUAIFENESIN 100 MG/5ML PO SOLN
5.0000 mL | ORAL | Status: DC | PRN
  Administered 2018-04-27 (×2): 100 mg via ORAL
  Filled 2018-04-27 (×2): qty 5

## 2018-04-27 NOTE — Evaluation (Signed)
Physical Therapy Evaluation Patient Details Name: Kaitlin [name] MRN: [id number] DOB: [birth year] Today's Date: 04/27/2018   History of Present Illness  67 year old female with past medical history of hypertension, diabetes mellitus, asthma, GERD, who is admitted by PCCM on 11/28 due to large right sided pulmonary embolism, and possible infarction versus infiltration, also found to have left lower lobe mass, concerning for malignancy. Positive DVT BLE's.   Clinical Impression  Pt admitted with above diagnosis. Pt currently with functional limitations due to the deficits listed below (see PT Problem List). Pt ambulated 150' with min-guard A, requested to use RW due to R sided chest pain and fatigue. Pt did not use a RW PTA. HR 115 bpm, O2 sats 95% during ambulation. After return to bed, HR 103 bpm, O2 sats 95%, RR 25.  Pt will benefit from skilled PT to increase their independence and safety with mobility to allow discharge to the venue listed below.       Follow Up Recommendations No PT follow up    Equipment Recommendations  Other (comment)(TBD, may need RW)    Recommendations for Other Services       Precautions / Restrictions Precautions Precautions: Other (comment) Precaution Comments: watch O2 sats and HR Restrictions Weight Bearing Restrictions: No      Mobility  Bed Mobility Overal bed mobility: Modified Independent                Transfers Overall transfer level: Needs assistance   Transfers: Sit to/from Stand Sit to Stand: Supervision         General transfer comment: pt needed increased time for sit to stand, vc's for hand placement  Ambulation/Gait Ambulation/Gait assistance: Min guard Gait Distance (Feet): 150 Feet Assistive device: Rolling walker (2 wheeled) Gait Pattern/deviations: Decreased stride length;Trunk flexed Gait velocity: decreased Gait velocity interpretation: 1.31 - 2.62 ft/sec, indicative of limited community ambulator General Gait  Details: pt did not ambulate with a RW PTA but requested to use on eval due to R chest pain and fatigue. Pt needed one standing rest break at 125'. HR 115 bpm, O2 sats 95% on RA  Stairs            Wheelchair Mobility    Modified Rankin (Stroke Patients Only)       Balance Overall balance assessment: Mild deficits observed, not formally tested                                           Pertinent Vitals/Pain Pain Assessment: 0-10 Pain Score: 8  Pain Location: R chest Pain Descriptors / Indicators: Stabbing Pain Intervention(s): Limited activity within patient's tolerance;Monitored during session    Home Living Family/patient expects to be discharged to:: Private residence Living Arrangements: Children Available Help at Discharge: Family;Available 24 hours/day Type of Home: Mobile home Home Access: Stairs to enter Entrance Stairs-Rails: Right Entrance Stairs-Number of Steps: 4 Home Layout: One level Home Equipment: None Additional Comments: 2 of pt's children live with her    Prior Function Level of Independence: Independent         Comments: pt just stopped working in Sept.      Hand Dominance        Extremity/Trunk Assessment   Upper Extremity Assessment Upper Extremity Assessment: Overall WFL for tasks assessed    Lower Extremity Assessment Lower Extremity Assessment: RLE deficits/detail;Overall [facility] for tasks assessed RLE Deficits /  Details: pt with some R calf tenderness RLE Sensation: WNL RLE Coordination: WNL    Cervical / Trunk Assessment Cervical / Trunk Assessment: Normal  Communication   Communication: No difficulties  Cognition Arousal/Alertness: Awake/alert Behavior During Therapy: WFL for tasks assessed/performed Overall Cognitive Status: Within Functional Limits for tasks assessed                                        General Comments      Exercises     Assessment/Plan    PT Assessment  Patient needs continued PT services  PT Problem List Decreased activity tolerance;Decreased mobility;Cardiopulmonary status limiting activity;Decreased knowledge of use of DME;Pain       PT Treatment Interventions DME instruction;Gait training;Stair training;Functional mobility training;Therapeutic activities;Therapeutic exercise;Balance training;Patient/family education    PT Goals (Current goals can be found in the Care Plan section)  Acute Rehab PT Goals Patient Stated Goal: get better, return home PT Goal Formulation: With patient Time For Goal Achievement: 05/11/18 Potential to Achieve Goals: Good    Frequency Min 3X/week   Barriers to discharge        Co-evaluation               AM-PAC PT "6 Clicks" Mobility  Outcome Measure Help needed turning from your back to your side while in a flat bed without using bedrails?: A Little Help needed moving from lying on your back to sitting on the side of a flat bed without using bedrails?: A Little Help needed moving to and from a bed to a chair (including a wheelchair)?: A Little Help needed standing up from a chair using your arms (e.g., wheelchair or bedside chair)?: A Little Help needed to walk in hospital room?: A Little Help needed climbing 3-5 steps with a railing? : A Lot 6 Click Score: 17    End of Session   Activity Tolerance: Patient tolerated treatment well;Patient limited by fatigue Patient left: in bed;with call bell/phone within reach;with family/visitor present Nurse Communication: Mobility status PT Visit Diagnosis: Pain;Difficulty in walking, not elsewhere classified (R26.2) Pain - Right/Left: Right Pain - part of body: (chest)    Time: 1400-1430 PT Time Calculation (min) (ACUTE ONLY): 30 min   Charges:   PT Evaluation $PT Eval Moderate Complexity: 1 Mod PT Treatments $Gait Training: 8-22 mins        Lyanne Co, PT  Acute Rehab Services  Pager [phone] Office  [phone]   Lawana Chambers Jonahtan Manseau 04/27/2018, 2:38 PM

## 2018-04-27 NOTE — Progress Notes (Signed)
[place] for heparin dosing Indication: pulmonary embolus  Allergies  Allergen Reactions  . Banana Anaphylaxis  . Aspirin Other (See Comments)    G.I. Upset     Patient Measurements: Height: 5\' 6"  (167.6 cm) Weight: 198 lb 6.6 oz (90 kg) IBW/kg (Calculated) : 59.3 HEPARIN DW (KG): 78.9  Vital Signs: Temp: 98.5 F (36.9 C) (12/01 0356) Temp Source: Oral (12/01 0356) BP: 111/75 (12/01 0356) Pulse Rate: 90 (12/01 0356)  Labs: Recent Labs    04/24/18 1347  04/24/18 2314 04/25/18 0539 04/25/18 1214  04/26/18 0335 04/26/18 1236 04/27/18 0322  HGB 11.6*  --   --  9.0*  --   --  8.5* 8.9* 9.0*  HCT 36.2  --   --  27.5*  --   --  28.0* 29.1* 29.5*  PLT 377  --   --  276  --   --  324 388 398  HEPARINUNFRC  --    < >  --  <0.10* 0.32   < > 0.39 0.37 0.40  CREATININE 1.03*  --   --  0.99  --   --   --   --   --   TROPONINI  --   --  <0.03 <0.03 <0.03  --   --   --   --    < > = values in this interval not displayed.    Estimated Creatinine Clearance: 62.3 mL/min (by C-G formula based on SCr of 0.99 mg/dL).   Medical History: Past Medical History:  Diagnosis Date  . Asthma   . Carpal tunnel syndrome   . Hypertension      Assessment: Pharmacy consulted to dose heparin infusion for this 62 yof with acute multi-focal PE, + b/l DVT. D-dimer is 14.76 mcg/mL-FEU.  Patient was not on any anti-coagulation prior to this event. Hgb low but stable, plts ok, no reports of bleeding.  Heparin level remains therapeutic 0.4 units/ml   Goal of Therapy:  Heparin level 0.3-0.7 units/ml Monitor platelets by anticoagulation protocol: Yes   Plan:  Continue heparin drip 1900 units/hr  Check anti-Xa level daily while on heparin Continue to monitor H&H, platelets, renal function, and s/sx bleeding   Juanell Fairly, PharmD PGY1 Pharmacy Resident Phone [phone] 04/27/2018 7:55 AM

## 2018-04-27 NOTE — Progress Notes (Signed)
NAME:  Kaitlin [name], MRN:  [id number], DOB:  December 31, 1950, LOS: 3 ADMISSION DATE:  04/24/2018, CONSULTATION DATE:  04/24/18 REFERRING MD:  Hyacinth Meeker  CHIEF COMPLAINT:  Dyspnea   Brief History   Kaitlin [name] is a 66 y.o. female who was transferred from [facility] with large right PE and LLL mass.  History of present illness   Kaitlin [name] is a 67 y.o. female who has a PMH of HTN, Asthma, Carpal Tunnel.  She presented to [facility] ED 11/28 with dyspnea x 3 days.  Symptoms gradually worsened to the point that she started to get tachypneic.  She also complained of subjective fevers and chills, dry cough, and right sided pleuritic chest pain.  She tried using her albuterol inhaler and nebulizer at home but got minimal relief.  She did not notice any hemoptysis until she was in the ED when she had 1 -2 episodes of blood tinged sputum.  Denies lightheadedness, dizziness, LE edema, tobacco use.  No recent exposures to known sick contacts.  In ED, she had CTA of the chest that demonstrated large right sided PE with possible RLL infarct vs infiltrate as well as a  LLL 3.8 x 3.7 x 2.1cm mass with hilar and mediastinal adenopathy.  She was subsequently transferred to [facility] for further evaluation and management.  She denied any hx of prior malignancy, VTE, recent travel / periods of prolonged immobilization, hormone use.  Past Medical History  HTN, Asthma, Dysphagia, Carpal Tunnel.  Significant Hospital Events   11/28 > admit.  Consults:  None.  Procedures:  None.  Significant Diagnostic Tests:  CTA chest 11/28 >  large right sided PE with possible RLL infarct vs infiltrate.  LLL 3.8 x 3.7 x 2.1cm mass with hilar and mediastinal adenopathy. Echocardiogram:Impressions:  - LVEF 60-65%, normal wall thickness, normal wall motion, grade 1   DD, indeterminate LV filling pressure, trivial MR, normal LA   size, normal RV size and systolic function, mild TR, RVSP 35   mmHg, IVC suggests elevated RA pressure of 8 mmHg,  small   pericardial effusion without tamponade. Micro Data:  Blood 11/28 > negative at present Sputum 11/28 > negative at present  Antimicrobials:  Azithromycin 11/28 >  Ceftriaxone 11/28 >    Interim history/subjective:  Currently on room air. still having right sided pleuritic pain-improved today Only wants to take Tylenol, refuses morphine or any opiates  Objective:  Blood pressure 111/76, pulse 95, temperature (!) 101.9 F (38.8 C), temperature source Oral, resp. rate (!) 25, height 5\' 6"  (1.676 m), weight 90 kg, SpO2 97 %.        Intake/Output Summary (Last 24 hours) at 04/27/2018 1625 Last data filed at 04/27/2018 1600 Gross per 24 hour  Intake 1370.1 ml  Output 1000 ml  Net 370.1 ml   Filed Weights   04/24/18 1340  Weight: 90 kg    Examination: General: Middle-aged lady, not in distress, more comfortable today Neuro: Alert and oriented x3 HEENT: Moist oral mucosa Cardiovascular: S1-S2 appreciated Lungs: Bibasilar rales Abdomen: Bowel sounds appreciated  Assessment & Plan:   Large right sided PE with possible RLL infarct vs infiltrate -  On room air at present Continue heparin  Pleuritic chest pain - due to above +/- CAP. - Low dose morphine ordered-patient does not want to use opiates has been using Tylenol. -Encouraged to consider opiates for pain management however just wants to use Tylenol at present  Possible community-acquired pneumonia, extensive infiltrates at the base  on the right - Continue empiric ceftriaxone / azithromycin. - Follow cultures.-Negative to date -Leukocytosis improving -Still has fevers-this may be on account of a pulmonary embolism/infarct  Hx asthma. - Continue bronchodilators  LLL mass with hilar and mediastinal adenopathy - concerning for malignancy. - CT guided biopsy might be least invasive means of obtaining tissue sample. - Will need outpatient f/u with oncology.  Hyponatremia - presumed hypovolemic.  Though can not  r/o SIADH in setting of lung mass. Continue fluids  AKI - Resolving  Dysphagia - seen by GI with plans to perform a maloney dilation as an outpatient. - F/u with GI as outpatient.  Will complete course of antibiotics Invasive intervention may be considered after about 3 weeks-anti-coagulation will need to be held for invasive intervention  Best Practice:  Diet: Heart healthy. DVT prophylaxis: Heparin gtt. Mobility: Ambulate as able Code Status: Full. Disposition: ICU.  Labs   CBC: Recent Labs  Lab 04/24/18 1347 04/25/18 0539 04/26/18 0335 04/26/18 1236 04/27/18 0322 04/27/18 1332  WBC 17.6* 16.7* 15.6* 14.2* 13.2* 13.3*  NEUTROABS 14.6*  --   --   --   --   --   HGB 11.6* 9.0* 8.5* 8.9* 9.0* 9.2*  HCT 36.2 27.5* 28.0* 29.1* 29.5* 28.8*  MCV 83.8 82.6 85.9 84.8 84.8 85.0  PLT 377 276 324 388 398 402*   Basic Metabolic Panel: Recent Labs  Lab 04/24/18 1347 04/25/18 0539  NA 130* 136  K 2.9* 3.5  CL 93* 106  CO2 23 19*  GLUCOSE 109* 161*  BUN 18 11  CREATININE 1.03* 0.99  CALCIUM 8.9 8.1*   GFR: Estimated Creatinine Clearance: 62.3 mL/min (by C-G formula based on SCr of 0.99 mg/dL). Recent Labs  Lab 04/24/18 1414 04/24/18 1601  04/26/18 0335 04/26/18 1236 04/27/18 0322 04/27/18 1332  WBC  --   --    < > 15.6* 14.2* 13.2* 13.3*  LATICACIDVEN 1.2 3.5*  --   --   --   --   --    < > = values in this interval not displayed.   Liver Function Tests: Recent Labs  Lab 04/24/18 1347  AST 54*  ALT 44  ALKPHOS 188*  BILITOT 1.9*  PROT 7.8  ALBUMIN 3.5   No results for input(s): LIPASE, AMYLASE in the last 168 hours. No results for input(s): AMMONIA in the last 168 hours. ABG No results found for: PHART, PCO2ART, PO2ART, HCO3, TCO2, ACIDBASEDEF, O2SAT  Coagulation Profile: No results for input(s): INR, PROTIME in the last 168 hours. Cardiac Enzymes: Recent Labs  Lab 04/24/18 2314 04/25/18 0539 04/25/18 1214  TROPONINI <0.03 <0.03 <0.03    HbA1C: Hgb A1c MFr Bld  Date/Time Value Ref Range Status  09/05/2012 05:00 AM 7.1 (H) <5.7 % Final    Comment:    (NOTE)                                                                       According to the ADA Clinical Practice Recommendations for 2011, when HbA1c is used as a screening test:  >=6.5%   Diagnostic of Diabetes Mellitus           (if abnormal result is confirmed) 5.7-6.4%   Increased risk of developing  Diabetes Mellitus References:Diagnosis and Classification of Diabetes Mellitus,Diabetes Care,2011,34(Suppl 1):S62-S69 and Standards of Medical Care in         Diabetes - 2011,Diabetes Care,2011,34 (Suppl 1):S11-S61.   CBG: Recent Labs  Lab 04/26/18 1717 04/26/18 2057 04/27/18 0748 04/27/18 0816 04/27/18 1239  GLUCAP 95 89 67* 93 83    Review of Systems:   Review of systems significant pain and discomfort Shortness of breath with activity is better today  Past medical history  She,  has a past medical history of Asthma, Carpal tunnel syndrome, and Hypertension.   Surgical History    Past Surgical History:  Procedure Laterality Date  . ABDOMINAL HYSTERECTOMY    . biopsy of right breast    . FOOT SURGERY    . implant left eye    . INTRAOCULAR LENS INSERTION       Social History   reports that she has never smoked. She has never used smokeless tobacco. She reports that she drinks alcohol. She reports that she does not use drugs.   Family history   Her family history includes Heart failure in her mother; Kidney failure in her other; Stroke in her mother.   Allergies Allergies  Allergen Reactions  . Banana Anaphylaxis  . Aspirin Other (See Comments)    G.I. Dorena Dew

## 2018-04-27 NOTE — Progress Notes (Signed)
PROGRESS NOTE  Kaitlin [name] UJW:[id number] DOB: [birth year] DOA: 04/24/2018 PCP: [name], MD   LOS: 3 days   Brief Narrative / Interim history: 67 year old female with history of hypertension, asthma, presented to any pain on 11/28 with significant shortness of breath for the past 3 days, as well as right-sided pleuritic type chest pain.  She was found to have large sided PE with possible right lower lobe infarct versus infiltrate as well as a left lower lobe 3.8 x 3.7 x 2.1 cm mass with hilar and mediastinal adenopathy.  She was transferred to [facility] and was admitted to the ICU.  She remained stable and was transferred to the hospitalist service on 12/1  Subjective: -Feeling a little bit better but not by much, remains significantly short of breath and is unable to take a few steps without becoming dyspneic, also remains quite tachypneic  Assessment & Plan: Active Problems:   Pulmonary emboli (HCC)   Principal Problem Acute hypoxic respiratory failure due to large sided PE with possible right lower lobe infarct versus infiltrate -Hypoxia improving, she is on oxygen this morning and will attempt to wean off, remains tachypneic -Continue heparin infusion, will need transition to oral anticoagulants by tomorrow  Additional Problems Community-acquired pneumonia, right lower lobe pneumonia -Started on ceftriaxone and azithromycin, continue, cultures all remain negative to date  History of asthma -Continue nebulizers, no wheezing at this appears stable  Left lower lobe mass with hilar and mediastinal lymphadenopathy -Concern for malignancy, will eventually need a biopsy but per critical care will need to be done after about 3 weeks of anticoagulation to be able to safely hold it for invasive interventions  Dysphagia -Followed by GI as an outpatient with plans for dilatation  Scheduled Meds: . arformoterol  15 mcg Nebulization BID  . budesonide (PULMICORT) nebulizer solution   0.5 mg Nebulization BID  . insulin aspart  0-15 Units Subcutaneous TID WC  . insulin aspart  0-5 Units Subcutaneous QHS   Continuous Infusions: . sodium chloride 10 mL/hr at 04/26/18 1100  . azithromycin 500 mg (04/27/18 1304)  . cefTRIAXone (ROCEPHIN)  IV 2 g (04/26/18 1736)  . heparin 1,900 Units/hr (04/27/18 0700)   PRN Meds:.acetaminophen, guaiFENesin, levalbuterol, morphine injection, ondansetron (ZOFRAN) IV  DVT prophylaxis: On heparin infusion Code Status: Full code Family Communication: No family at bedside Disposition Plan: Home when ready, 2 to 3 days  Consultants:   Critical care  Procedures:   2D echo:  Impressions: - LVEF 60-65%, normal wall thickness, normal wall motion, grade 1 DD, indeterminate LV filling pressure, trivial MR, normal LA size, normal RV size and systolic function, mild TR, RVSP 35 mmHg, IVC suggests elevated RA pressure of 8 mmHg, small pericardial effusion without tamponade.  Antimicrobials:  Ceftriaxone 11/28 >>  Azithromycin 11/28 >>  Objective: Vitals:   04/27/18 0356 04/27/18 0800 04/27/18 0854 04/27/18 1237  BP: 111/75 129/67  111/76  Pulse: 90 90  95  Resp: 19 (!) 24  (!) 25  Temp: 98.5 F (36.9 C) 98.8 F (37.1 C)  (!) 101.9 F (38.8 C)  TempSrc: Oral Oral  Oral  SpO2: 98% 100% 100% 97%  Weight:      Height:        Intake/Output Summary (Last 24 hours) at 04/27/2018 1333 Last data filed at 04/27/2018 0700 Gross per 24 hour  Intake 152 ml  Output 1000 ml  Net -848 ml   Filed Weights   04/24/18 1340  Weight: 90 kg  Examination:  Constitutional: Appears tachypneic and tired Eyes: PERRL, lids and conjunctivae normal ENMT: Mucous membranes are moist.  Neck: normal, supple Respiratory: Diminished breath sounds at the bases, no wheezing or crackles, increased respiratory effort Cardiovascular: Regular rate and rhythm, no murmurs / rubs / gallops. No LE edema.  Abdomen: no tenderness. Bowel sounds positive.    Musculoskeletal: no clubbing / cyanosis. Skin: no rashes, lesions, ulcers. No induration Neurologic: CN 2-12 grossly intact. Strength 5/5 in all 4.   Data Reviewed: I have independently reviewed following labs and imaging studies   CBC: Recent Labs  Lab 04/24/18 1347 04/25/18 0539 04/26/18 0335 04/26/18 1236 04/27/18 0322  WBC 17.6* 16.7* 15.6* 14.2* 13.2*  NEUTROABS 14.6*  --   --   --   --   HGB 11.6* 9.0* 8.5* 8.9* 9.0*  HCT 36.2 27.5* 28.0* 29.1* 29.5*  MCV 83.8 82.6 85.9 84.8 84.8  PLT 377 276 324 388 398   Basic Metabolic Panel: Recent Labs  Lab 04/24/18 1347 04/25/18 0539  NA 130* 136  K 2.9* 3.5  CL 93* 106  CO2 23 19*  GLUCOSE 109* 161*  BUN 18 11  CREATININE 1.03* 0.99  CALCIUM 8.9 8.1*   GFR: Estimated Creatinine Clearance: 62.3 mL/min (by C-G formula based on SCr of 0.99 mg/dL). Liver Function Tests: Recent Labs  Lab 04/24/18 1347  AST 54*  ALT 44  ALKPHOS 188*  BILITOT 1.9*  PROT 7.8  ALBUMIN 3.5   No results for input(s): LIPASE, AMYLASE in the last 168 hours. No results for input(s): AMMONIA in the last 168 hours. Coagulation Profile: No results for input(s): INR, PROTIME in the last 168 hours. Cardiac Enzymes: Recent Labs  Lab 04/24/18 2314 04/25/18 0539 04/25/18 1214  TROPONINI <0.03 <0.03 <0.03   BNP (last 3 results) No results for input(s): PROBNP in the last 8760 hours. HbA1C: No results for input(s): HGBA1C in the last 72 hours. CBG: Recent Labs  Lab 04/26/18 1717 04/26/18 2057 04/27/18 0748 04/27/18 0816 04/27/18 1239  GLUCAP 95 89 67* 93 83   Lipid Profile: No results for input(s): CHOL, HDL, LDLCALC, TRIG, CHOLHDL, LDLDIRECT in the last 72 hours. Thyroid Function Tests: No results for input(s): TSH, T4TOTAL, FREET4, T3FREE, THYROIDAB in the last 72 hours. Anemia Panel: No results for input(s): VITAMINB12, FOLATE, FERRITIN, TIBC, IRON, RETICCTPCT in the last 72 hours. Urine analysis:    Component Value  Date/Time   COLORURINE YELLOW 04/11/2007 1936   APPEARANCEUR CLEAR 04/11/2007 1936   LABSPEC 1.015 04/11/2007 1936   PHURINE 5.5 04/11/2007 1936   GLUCOSEU NEGATIVE 04/11/2007 1936   HGBUR LARGE (A) 04/11/2007 1936   BILIRUBINUR NEGATIVE 04/11/2007 1936   KETONESUR NEGATIVE 04/11/2007 1936   PROTEINUR 100 (A) 04/11/2007 1936   UROBILINOGEN 0.2 04/11/2007 1936   NITRITE POSITIVE (A) 04/11/2007 1936   LEUKOCYTESUR TRACE (A) 04/11/2007 1936   Sepsis Labs: Invalid input(s): PROCALCITONIN, LACTICIDVEN  Recent Results (from the past 240 hour(s))  Blood Culture (routine x 2)     Status: None (Preliminary result)   Collection Time: 04/24/18  1:50 PM  Result Value Ref Range Status   Specimen Description RIGHT ANTECUBITAL  Final   Special Requests   Final    BOTTLES DRAWN AEROBIC AND ANAEROBIC Blood Culture adequate volume   Culture   Final    NO GROWTH 3 DAYS Performed at [facility], [address], [place], [place] [address]    Report Status PENDING  Incomplete  Blood Culture (routine x 2)  Status: None (Preliminary result)   Collection Time: 04/24/18  2:14 PM  Result Value Ref Range Status   Specimen Description LEFT ANTECUBITAL  Final   Special Requests   Final    BOTTLES DRAWN AEROBIC AND ANAEROBIC Blood Culture adequate volume   Culture   Final    NO GROWTH 3 DAYS Performed at [facility], [address] [place], [place] [address]    Report Status PENDING  Incomplete  MRSA PCR Screening     Status: None   Collection Time: 04/24/18  7:52 PM  Result Value Ref Range Status   MRSA by PCR NEGATIVE NEGATIVE Final    Comment:        The GeneXpert MRSA Assay (FDA approved for NASAL specimens only), is one component of a comprehensive MRSA colonization surveillance program. It is not intended to diagnose MRSA infection nor to guide or monitor treatment for MRSA infections. Performed at [facility], [address] [place], [place] [address]   Expectorated sputum  assessment w rflx to resp cult     Status: None   Collection Time: 04/24/18  9:42 PM  Result Value Ref Range Status   Specimen Description SPUTUM  Final   Special Requests NONE  Final   Sputum evaluation   Final    Sputum specimen not acceptable for testing.  Please recollect.   RESULT CALLED TO, READ BACK BY AND VERIFIED WITH: A SAWYERS RN [phone] 04/25/18 A BROWNING Performed at [facility], [address], [place], [place] [address]    Report Status 04/25/2018 FINAL  Final      Radiology Studies: Vas [place] Lower Extremity Venous (dvt)  Result Date: 04/25/2018  Lower Venous Study Indications: Pulmonary embolism, and SOB.  Limitations: Body habitus. Performing Technologist: Annamaria Helling  Examination Guidelines: A complete evaluation includes B-mode imaging, spectral Doppler, color Doppler, and power Doppler as needed of all accessible portions of each vessel. Bilateral testing is considered an integral part of a complete examination. Limited examinations for reoccurring indications may be performed as noted.  Right Venous Findings: +---------+---------------+---------+-----------+----------+-----------------+          CompressibilityPhasicitySpontaneityPropertiesSummary           +---------+---------------+---------+-----------+----------+-----------------+ CFV      Full           Yes      Yes                                    +---------+---------------+---------+-----------+----------+-----------------+ SFJ      Full                                                           +---------+---------------+---------+-----------+----------+-----------------+ FV Prox  Full                                                           +---------+---------------+---------+-----------+----------+-----------------+ FV Mid   Full                                                           +---------+---------------+---------+-----------+----------+-----------------+  FV  DistalFull                                                           +---------+---------------+---------+-----------+----------+-----------------+ PFV      Full                                                           +---------+---------------+---------+-----------+----------+-----------------+ POP      Full           Yes      Yes                                    +---------+---------------+---------+-----------+----------+-----------------+ PTV      Partial                 No                   Age Indeterminate +---------+---------------+---------+-----------+----------+-----------------+ PERO     Partial                 No                   Age Indeterminate +---------+---------------+---------+-----------+----------+-----------------+  Left Venous Findings: +---------+---------------+---------+-----------+----------+-----------------+          CompressibilityPhasicitySpontaneityPropertiesSummary           +---------+---------------+---------+-----------+----------+-----------------+ CFV      Full           Yes      Yes                                    +---------+---------------+---------+-----------+----------+-----------------+ SFJ      Full                                                           +---------+---------------+---------+-----------+----------+-----------------+ FV Prox  Full                                                           +---------+---------------+---------+-----------+----------+-----------------+ FV Mid   Full                                                           +---------+---------------+---------+-----------+----------+-----------------+ FV DistalFull                                                           +---------+---------------+---------+-----------+----------+-----------------+  PFV      Full                                                            +---------+---------------+---------+-----------+----------+-----------------+ POP      Full           Yes      Yes                                    +---------+---------------+---------+-----------+----------+-----------------+ PTV      Partial                 No                   Age Indeterminate +---------+---------------+---------+-----------+----------+-----------------+ PERO     Partial                 No                   Age Indeterminate +---------+---------------+---------+-----------+----------+-----------------+    Summary: Right: Findings consistent with age indeterminate deep vein thrombosis involving the right posterior tibial vein, and right peroneal vein. No cystic structure found in the popliteal fossa. Left: Findings consistent with age indeterminate deep vein thrombosis involving the left posterior tibial vein, and left peroneal vein. No cystic structure found in the popliteal fossa.  *See table(s) above for measurements and observations. Electronically signed by Lemar Livings MD on 04/25/2018 at 3:36:07 PM.    Final     Pamella Pert, MD, PhD [facility] Pager [phone]  If 7PM-7AM, please contact night-coverage [email or web address] Password [email or web address] 04/27/2018, 1:33 PM

## 2018-04-28 ENCOUNTER — Telehealth: Payer: Self-pay

## 2018-04-28 DIAGNOSIS — A419 Sepsis, unspecified organism: Secondary | ICD-10-CM

## 2018-04-28 DIAGNOSIS — R918 Other nonspecific abnormal finding of lung field: Secondary | ICD-10-CM

## 2018-04-28 DIAGNOSIS — I2694 Multiple subsegmental pulmonary emboli without acute cor pulmonale: Secondary | ICD-10-CM

## 2018-04-28 DIAGNOSIS — R6520 Severe sepsis without septic shock: Secondary | ICD-10-CM

## 2018-04-28 DIAGNOSIS — J9600 Acute respiratory failure, unspecified whether with hypoxia or hypercapnia: Secondary | ICD-10-CM

## 2018-04-28 LAB — CBC
HCT: 27 % — ABNORMAL LOW (ref 36.0–46.0)
HCT: 29 % — ABNORMAL LOW (ref 36.0–46.0)
Hemoglobin: 8.7 g/dL — ABNORMAL LOW (ref 12.0–15.0)
Hemoglobin: 9 g/dL — ABNORMAL LOW (ref 12.0–15.0)
MCH: 26.8 pg (ref 26.0–34.0)
MCH: 26 pg (ref 26.0–34.0)
MCHC: 32.2 g/dL (ref 30.0–36.0)
MCHC: 31 g/dL (ref 30.0–36.0)
MCV: 83.1 fL (ref 80.0–100.0)
MCV: 83.8 fL (ref 80.0–100.0)
PLATELETS: 441 10*3/uL — AB (ref 150–400)
Platelets: 520 10*3/uL — ABNORMAL HIGH (ref 150–400)
RBC: 3.25 MIL/uL — ABNORMAL LOW (ref 3.87–5.11)
RBC: 3.46 MIL/uL — ABNORMAL LOW (ref 3.87–5.11)
RDW: 13.7 % (ref 11.5–15.5)
RDW: 13.9 % (ref 11.5–15.5)
WBC: 13.4 10*3/uL — ABNORMAL HIGH (ref 4.0–10.5)
WBC: 13.1 10*3/uL — ABNORMAL HIGH (ref 4.0–10.5)
nRBC: 0 % (ref 0.0–0.2)
nRBC: 0 % (ref 0.0–0.2)

## 2018-04-28 LAB — BASIC METABOLIC PANEL
Anion gap: 12 (ref 5–15)
BUN: 6 mg/dL — ABNORMAL LOW (ref 8–23)
CO2: 27 mmol/L (ref 22–32)
Calcium: 8.7 mg/dL — ABNORMAL LOW (ref 8.9–10.3)
Chloride: 95 mmol/L — ABNORMAL LOW (ref 98–111)
Creatinine, Ser: 0.72 mg/dL (ref 0.44–1.00)
GFR calc Af Amer: >60 mL/min (ref >60)
GFR calc non Af Amer: >60 mL/min (ref >60)
GLUCOSE: 108 mg/dL — AB (ref 70–99)
Potassium: 3 mmol/L — ABNORMAL LOW (ref 3.5–5.1)
Sodium: 134 mmol/L — ABNORMAL LOW (ref 135–145)

## 2018-04-28 LAB — HEPARIN LEVEL (UNFRACTIONATED): Heparin Unfractionated: 0.45 IU/mL (ref 0.30–0.70)

## 2018-04-28 LAB — GLUCOSE, CAPILLARY
GLUCOSE-CAPILLARY: 90 mg/dL (ref 70–99)
GLUCOSE-CAPILLARY: 121 mg/dL — AB (ref 70–99)
Glucose-Capillary: 99 mg/dL (ref 70–99)
Glucose-Capillary: 108 mg/dL — ABNORMAL HIGH (ref 70–99)

## 2018-04-28 MED ORDER — POTASSIUM CHLORIDE CRYS ER 20 MEQ PO TBCR
40.0000 meq | EXTENDED_RELEASE_TABLET | ORAL | Status: AC
  Administered 2018-04-28 (×2): 40 meq via ORAL
  Filled 2018-04-28: qty 2

## 2018-04-28 MED ORDER — ELIQUIS 5 MG VTE STARTER PACK: ORAL_TABLET | ORAL | 0 refills | Status: DC

## 2018-04-28 MED ORDER — AZITHROMYCIN 250 MG PO TABS
500.0000 mg | ORAL_TABLET | Freq: Every day | ORAL | Status: DC
  Administered 2018-04-28: 500 mg via ORAL
  Filled 2018-04-28: qty 2

## 2018-04-28 MED ORDER — APIXABAN 5 MG PO TABS
10.0000 mg | ORAL_TABLET | Freq: Two times a day (BID) | ORAL | Status: DC
  Administered 2018-04-28 – 2018-04-29 (×3): 10 mg via ORAL
  Filled 2018-04-28 (×3): qty 2

## 2018-04-28 MED FILL — ELIQUIS STARTER PACK 5 MG T: 5 | 30 days supply | Qty: 74 | Fill #0

## 2018-04-28 NOTE — Care Management Note (Signed)
Case Management Note  Patient Details  Name: Kaitlin [name] MRN: [id number] Date of Birth: 12-06-1950  Subjective/Objective:  From home, presents with PE on heparin will transition to oral anticoags,  NCM awaiting benfit check for eliquis/xarelto.  Per pt eval no pt follow up need, she may need a rolling walker though.                 Action/Plan: NCM will follow for transition of care needs.  Expected Discharge Date:                  Expected Discharge Plan:  Home/Self Care  In-House Referral:     Discharge planning Services  CM Consult  Post Acute Care Choice:    Choice offered to:     DME Arranged:    DME Agency:     HH Arranged:    HH Agency:     Status of Service:  In process, will continue to follow  If discussed at Long Length of Stay Meetings, dates discussed:    Additional Comments:  Zenon Mayo, RN 04/28/2018, 10:08 AM

## 2018-04-28 NOTE — Telephone Encounter (Signed)
Looked at pt's chart and saw that pt is currently in the hospital at [facility]. Pt has been in hospital since 04/24/18. Will await a return call from either pt or pt's daughter in regards to the appts that were scheduled by Corrine. If appts need to be rescheduled, this can be arranged.

## 2018-04-28 NOTE — Progress Notes (Signed)
PROGRESS NOTE  Kaitlin [name] AYT:[id number] DOB: 03-Jul-1950 DOA: 04/24/2018 PCP: [name], MD   LOS: 4 days   Brief Narrative / Interim history: 67 year old female with history of hypertension, asthma, presented to any pain on 11/28 with significant shortness of breath for the past 3 days, as well as right-sided pleuritic type chest pain.  She was found to have large sided PE with possible right lower lobe infarct versus infiltrate as well as a left lower lobe 3.8 x 3.7 x 2.1 cm mass with hilar and mediastinal adenopathy.  She was transferred to [facility] and was admitted to the ICU.  She remained stable and was transferred to the hospitalist service on 12/1  Subjective: -Continues to improve but is complaining of significant shortness of breath and weakness with ambulation  Assessment & Plan: Active Problems:   Pulmonary emboli (HCC)   Principal Problem Acute hypoxic respiratory failure due to large sided PE with possible right lower lobe infarct versus infiltrate -Hypoxia improving, she is found on room air this morning, but does appear tachypneic -Stable on heparin, no bleeding, transition to Eliquis, discussed with pharmacy as well as PCCM  Additional Problems Community-acquired pneumonia, right lower lobe pneumonia -Started on ceftriaxone and azithromycin, continue, today day 5, will discontinue antibiotics  History of asthma -Continue nebulizers, no wheezing at this appears stable  Left lower lobe mass with hilar and mediastinal lymphadenopathy -Concern for malignancy, will eventually need a biopsy but per critical care will need to be done after about 3 weeks of anticoagulation to be able to safely hold it for invasive interventions  Dysphagia -Followed by GI as an outpatient with plans for dilatation  Scheduled Meds: . apixaban  10 mg Oral BID  . arformoterol  15 mcg Nebulization BID  . azithromycin  500 mg Oral Daily  . budesonide (PULMICORT) nebulizer solution  0.5  mg Nebulization BID  . insulin aspart  0-15 Units Subcutaneous TID WC  . insulin aspart  0-5 Units Subcutaneous QHS   Continuous Infusions: . sodium chloride 10 mL/hr at 04/26/18 1100  . cefTRIAXone (ROCEPHIN)  IV 2 g (04/27/18 1534)   PRN Meds:.acetaminophen, guaiFENesin, levalbuterol, morphine injection, ondansetron (ZOFRAN) IV  DVT prophylaxis: On heparin infusion Code Status: Full code Family Communication: No family at bedside Disposition Plan: Home when ready, 2 to 3 days  Consultants:   Critical care  Procedures:   2D echo:  Impressions: - LVEF 60-65%, normal wall thickness, normal wall motion, grade 1 DD, indeterminate LV filling pressure, trivial MR, normal LA size, normal RV size and systolic function, mild TR, RVSP 35 mmHg, IVC suggests elevated RA pressure of 8 mmHg, small pericardial effusion without tamponade.  Antimicrobials:  Ceftriaxone 11/28 >>  Azithromycin 11/28 >>  Objective: Vitals:   04/28/18 0300 04/28/18 0741 04/28/18 0828 04/28/18 1153  BP: (!) 126/58 131/85  129/69  Pulse:  91 94 87  Resp: (!) 27 (!) 22 (!) 30 (!) 26  Temp: 99 F (37.2 C) 98.7 F (37.1 C)  98.7 F (37.1 C)  TempSrc: Oral Oral  Oral  SpO2: 91% 99% 98% 100%  Weight:      Height:        Intake/Output Summary (Last 24 hours) at 04/28/2018 1339 Last data filed at 04/27/2018 1600 Gross per 24 hour  Intake 1218.1 ml  Output -  Net 1218.1 ml   Filed Weights   04/24/18 1340  Weight: 90 kg    Examination:  Constitutional: No distress but does appear  tachypneic Eyes: No scleral icterus ENMT: Moist mucous membranes Neck: normal, supple Respiratory: Diminished breath sounds at the bases, shallow breathing but no wheezing or crackles heard. Cardiovascular: Regular rate and rhythm, no murmurs, no peripheral edema Abdomen: Soft, nontender, nondistended, positive bowel sounds Musculoskeletal: no clubbing / cyanosis. Skin: No rashes seen Neurologic: Nonfocal,  ambulatory  Data Reviewed: I have independently reviewed following labs and imaging studies   CBC: Recent Labs  Lab 04/24/18 1347  04/26/18 0335 04/26/18 1236 04/27/18 0322 04/27/18 1332 04/28/18 0219  WBC 17.6*   < > 15.6* 14.2* 13.2* 13.3* 13.4*  NEUTROABS 14.6*  --   --   --   --   --   --   HGB 11.6*   < > 8.5* 8.9* 9.0* 9.2* 8.7*  HCT 36.2   < > 28.0* 29.1* 29.5* 28.8* 27.0*  MCV 83.8   < > 85.9 84.8 84.8 85.0 83.1  PLT 377   < > 324 388 398 402* 441*   < > = values in this interval not displayed.   Basic Metabolic Panel: Recent Labs  Lab 04/24/18 1347 04/25/18 0539 04/28/18 0219  NA 130* 136 134*  K 2.9* 3.5 3.0*  CL 93* 106 95*  CO2 23 19* 27  GLUCOSE 109* 161* 108*  BUN 18 11 6*  CREATININE 1.03* 0.99 0.72  CALCIUM 8.9 8.1* 8.7*   GFR: Estimated Creatinine Clearance: 77.1 mL/min (by C-G formula based on SCr of 0.72 mg/dL). Liver Function Tests: Recent Labs  Lab 04/24/18 1347  AST 54*  ALT 44  ALKPHOS 188*  BILITOT 1.9*  PROT 7.8  ALBUMIN 3.5   No results for input(s): LIPASE, AMYLASE in the last 168 hours. No results for input(s): AMMONIA in the last 168 hours. Coagulation Profile: No results for input(s): INR, PROTIME in the last 168 hours. Cardiac Enzymes: Recent Labs  Lab 04/24/18 2314 04/25/18 0539 04/25/18 1214  TROPONINI <0.03 <0.03 <0.03   BNP (last 3 results) No results for input(s): PROBNP in the last 8760 hours. HbA1C: No results for input(s): HGBA1C in the last 72 hours. CBG: Recent Labs  Lab 04/27/18 1239 04/27/18 1736 04/27/18 2121 04/28/18 0717 04/28/18 1116  GLUCAP 83 145* 85 90 99   Lipid Profile: No results for input(s): CHOL, HDL, LDLCALC, TRIG, CHOLHDL, LDLDIRECT in the last 72 hours. Thyroid Function Tests: No results for input(s): TSH, T4TOTAL, FREET4, T3FREE, THYROIDAB in the last 72 hours. Anemia Panel: No results for input(s): VITAMINB12, FOLATE, FERRITIN, TIBC, IRON, RETICCTPCT in the last 72  hours. Urine analysis:    Component Value Date/Time   COLORURINE YELLOW 04/11/2007 1936   APPEARANCEUR CLEAR 04/11/2007 1936   LABSPEC 1.015 04/11/2007 1936   PHURINE 5.5 04/11/2007 1936   GLUCOSEU NEGATIVE 04/11/2007 1936   HGBUR LARGE (A) 04/11/2007 1936   BILIRUBINUR NEGATIVE 04/11/2007 1936   KETONESUR NEGATIVE 04/11/2007 1936   PROTEINUR 100 (A) 04/11/2007 1936   UROBILINOGEN 0.2 04/11/2007 1936   NITRITE POSITIVE (A) 04/11/2007 1936   LEUKOCYTESUR TRACE (A) 04/11/2007 1936   Sepsis Labs: Invalid input(s): PROCALCITONIN, LACTICIDVEN  Recent Results (from the past 240 hour(s))  Blood Culture (routine x 2)     Status: None (Preliminary result)   Collection Time: 04/24/18  1:50 PM  Result Value Ref Range Status   Specimen Description RIGHT ANTECUBITAL  Final   Special Requests   Final    BOTTLES DRAWN AEROBIC AND ANAEROBIC Blood Culture adequate volume   Culture   Final  NO GROWTH 4 DAYS Performed at [facility], [address] [place], [place] [address]    Report Status PENDING  Incomplete  Blood Culture (routine x 2)     Status: None (Preliminary result)   Collection Time: 04/24/18  2:14 PM  Result Value Ref Range Status   Specimen Description LEFT ANTECUBITAL  Final   Special Requests   Final    BOTTLES DRAWN AEROBIC AND ANAEROBIC Blood Culture adequate volume   Culture   Final    NO GROWTH 4 DAYS Performed at [facility], [address], [place], [place] [address]    Report Status PENDING  Incomplete  MRSA PCR Screening     Status: None   Collection Time: 04/24/18  7:52 PM  Result Value Ref Range Status   MRSA by PCR NEGATIVE NEGATIVE Final    Comment:        The GeneXpert MRSA Assay (FDA approved for NASAL specimens only), is one component of a comprehensive MRSA colonization surveillance program. It is not intended to diagnose MRSA infection nor to guide or monitor treatment for MRSA infections. Performed at [facility], [address],  [place], [place] [address]   Expectorated sputum assessment w rflx to resp cult     Status: None   Collection Time: 04/24/18  9:42 PM  Result Value Ref Range Status   Specimen Description SPUTUM  Final   Special Requests NONE  Final   Sputum evaluation   Final    Sputum specimen not acceptable for testing.  Please recollect.   RESULT CALLED TO, READ BACK BY AND VERIFIED WITH: A SAWYERS RN [phone] 04/25/18 A BROWNING Performed at [facility], [address] [place], [place] [address]    Report Status 04/25/2018 FINAL  Final      Radiology Studies: No results found.  Kaitlin Pert, MD, PhD [facility] Pager [phone]  If 7PM-7AM, please contact night-coverage [email or web address] Password [email or web address] 04/28/2018, 1:39 PM

## 2018-04-28 NOTE — Care Management (Signed)
#.   S/W KAY @ [facility] SOLUTION DIRECT  RX # [phone]  APIXABAN : NONE FORMULARY ELIQUIS  10 MG : NONE FORMULARY  1. ELIQUIS  2.5 MG BID COVER- YES CO-PAY- $ 45.00 TIER- 3 DRUG PRIOR APPROVAL- NO  2. ELIQUIS  5 MG BID COVER- YES CO-PAY- $ 45.00 TIER- 3 DRUG PRIOR APPROVAL- NO  3. XARELTO  15 MG BID COVER- YES CO-PAY- $ 45.00 TIER- 3 DRUG PRIOR APPROVAL- NO  4. XARELTO  20 MG DAILY COVER- YES CO-PAY- $ 45.00 TIER- 3 DRUG PRIOR  APPROVAL- NO  PREFERRED PHARMACY :  YES    [facility]

## 2018-04-28 NOTE — Progress Notes (Signed)
NAME:  Kaitlin [name], MRN:  [id number], DOB:  [birth year], LOS: 4 ADMISSION DATE:  04/24/2018, CONSULTATION DATE:  04/24/18 REFERRING MD:  Hyacinth Meeker  CHIEF COMPLAINT:  Dyspnea   Brief History   Kaitlin [name] is a 67 y.o. female who was transferred from [facility] with large right PE and LLL mass.  History of present illness   Kaitlin [name] is a 67 y.o. female who has a PMH of HTN, Asthma, Carpal Tunnel.  She presented to [facility] ED 11/28 with dyspnea x 3 days.  Symptoms gradually worsened to the point that she started to get tachypneic.  She also complained of subjective fevers and chills, dry cough, and right sided pleuritic chest pain.  She tried using her albuterol inhaler and nebulizer at home but got minimal relief.  She did not notice any hemoptysis until she was in the ED when she had 1 -2 episodes of blood tinged sputum.  Denies lightheadedness, dizziness, LE edema, tobacco use.  No recent exposures to known sick contacts.  In ED, she had CTA of the chest that demonstrated large right sided PE with possible RLL infarct vs infiltrate as well as a  LLL 3.8 x 3.7 x 2.1cm mass with hilar and mediastinal adenopathy.  She was subsequently transferred to [facility] for further evaluation and management.  She denied any hx of prior malignancy, VTE, recent travel / periods of prolonged immobilization, hormone use.  Past Medical History  HTN, Asthma, Dysphagia, Carpal Tunnel.  Significant Hospital Events   11/28 > admit.  Consults:  None.  Procedures:  None.  Significant Diagnostic Tests:  CTA chest 11/28 >  large right sided PE with possible RLL infarct vs infiltrate.  LLL 3.8 x 3.7 x 2.1cm mass with hilar and mediastinal adenopathy. Echocardiogram:Impressions:  - LVEF 60-65%, normal wall thickness, normal wall motion, grade 1   DD, indeterminate LV filling pressure, trivial MR, normal LA   size, normal RV size and systolic function, mild TR, RVSP 35   mmHg, IVC suggests elevated RA pressure of 8 mmHg,  small   pericardial effusion without tamponade. Micro Data:  Blood 11/28 > negative at present Sputum 11/28 > negative at present  Antimicrobials:  Azithromycin 11/28 >  Ceftriaxone 11/28 >    Interim history/subjective:  No events overnight, no new complaints Asking to go home  Objective:  Blood pressure 129/69, pulse 87, temperature 98.7 F (37.1 C), temperature source Oral, resp. rate (!) 26, height 5\' 6"  (1.676 m), weight 90 kg, SpO2 100 %.        Intake/Output Summary (Last 24 hours) at 04/28/2018 1525 Last data filed at 04/27/2018 1600 Gross per 24 hour  Intake 1218.1 ml  Output -  Net 1218.1 ml   Filed Weights   04/24/18 1340  Weight: 90 kg   Examination: General: Middle aged female, NAD Neuro: Alert and oriented, moving all ext to command HEENT: [place]/AT, PERRL, EOM-I and MMM Cardiovascular: RRR, Nl S1/S2 and -M/R/G Lungs: R>L crackles noted Abdomen: Soft, NT, ND and +BS Ext: -edema and -tenderness Skin: intact  I reviewed chest CT myself, LLL mass noted, RLL infiltrate  Assessment & Plan:   Large right sided PE with possible RLL infarct vs infiltrate -  - D/C O2 - Continue heparin and transition to NOAC  Pleuritic chest pain - due to above +/- CAP. - PRN morphine for pain as needed  Possible community-acquired pneumonia, extensive infiltrates at the base on the right - D/C rocephin and zithromax -  Augmentin for total abx days of 7 days  Hx asthma. - Continue bronchodilators  LLL mass with hilar and mediastinal adenopathy - concerning for malignancy. - Begin anti-coagulation - PET/CT and CT guided biopsy in 3-4 wks as outpatient after stabilization of the clot - Will need outpatient f/u with oncology. - CT guided will be the least invasive for diagnosis and may need radiation but will defer to oncology at this point  Discussed with Dr. Fransico [facility]:  Diet: Heart healthy. DVT prophylaxis: Heparin gtt. Mobility: Ambulate as able Code  Status: Full. Disposition: ICU.  Labs   CBC: Recent Labs  Lab 04/24/18 1347  04/26/18 1236 04/27/18 0322 04/27/18 1332 04/28/18 0219 04/28/18 1331  WBC 17.6*   < > 14.2* 13.2* 13.3* 13.4* 13.1*  NEUTROABS 14.6*  --   --   --   --   --   --   HGB 11.6*   < > 8.9* 9.0* 9.2* 8.7* 9.0*  HCT 36.2   < > 29.1* 29.5* 28.8* 27.0* 29.0*  MCV 83.8   < > 84.8 84.8 85.0 83.1 83.8  PLT 377   < > 388 398 402* 441* 520*   < > = values in this interval not displayed.   Basic Metabolic Panel: Recent Labs  Lab 04/24/18 1347 04/25/18 0539 04/28/18 0219  NA 130* 136 134*  K 2.9* 3.5 3.0*  CL 93* 106 95*  CO2 23 19* 27  GLUCOSE 109* 161* 108*  BUN 18 11 6*  CREATININE 1.03* 0.99 0.72  CALCIUM 8.9 8.1* 8.7*   GFR: Estimated Creatinine Clearance: 77.1 mL/min (by C-G formula based on SCr of 0.72 mg/dL). Recent Labs  Lab 04/24/18 1414 04/24/18 1601  04/27/18 0322 04/27/18 1332 04/28/18 0219 04/28/18 1331  WBC  --   --    < > 13.2* 13.3* 13.4* 13.1*  LATICACIDVEN 1.2 3.5*  --   --   --   --   --    < > = values in this interval not displayed.   Liver Function Tests: Recent Labs  Lab 04/24/18 1347  AST 54*  ALT 44  ALKPHOS 188*  BILITOT 1.9*  PROT 7.8  ALBUMIN 3.5   No results for input(s): LIPASE, AMYLASE in the last 168 hours. No results for input(s): AMMONIA in the last 168 hours. ABG No results found for: PHART, PCO2ART, PO2ART, HCO3, TCO2, ACIDBASEDEF, O2SAT  Coagulation Profile: No results for input(s): INR, PROTIME in the last 168 hours. Cardiac Enzymes: Recent Labs  Lab 04/24/18 2314 04/25/18 0539 04/25/18 1214  TROPONINI <0.03 <0.03 <0.03   HbA1C: Hgb A1c MFr Bld  Date/Time Value Ref Range Status  09/05/2012 05:00 AM 7.1 (H) <5.7 % Final    Comment:    (NOTE)                                                                       According to the ADA Clinical Practice Recommendations for 2011, when HbA1c is used as a screening test:  >=6.5%   Diagnostic  of Diabetes Mellitus           (if abnormal result is confirmed) 5.7-6.4%   Increased risk of developing Diabetes Mellitus References:Diagnosis and Classification of Diabetes Mellitus,Diabetes  ZOXW,9604,54(UJWJX 1):S62-S69 and Standards of Medical Care in         Diabetes - 2011,Diabetes Care,2011,34 (Suppl 1):S11-S61.   CBG: Recent Labs  Lab 04/27/18 1239 04/27/18 1736 04/27/18 2121 04/28/18 0717 04/28/18 1116  GLUCAP 83 145* 85 90 99    Review of Systems:   Review of systems significant pain and discomfort Shortness of breath with activity is better today  Past medical history  She,  has a past medical history of Asthma, Carpal tunnel syndrome, and Hypertension.   Surgical History    Past Surgical History:  Procedure Laterality Date  . ABDOMINAL HYSTERECTOMY    . biopsy of right breast    . FOOT SURGERY    . implant left eye    . INTRAOCULAR LENS INSERTION       Social History   reports that she has never smoked. She has never used smokeless tobacco. She reports that she drinks alcohol. She reports that she does not use drugs.   Family history   Her family history includes Heart failure in her mother; Kidney failure in her other; Stroke in her mother.   Allergies Allergies  Allergen Reactions  . Banana Anaphylaxis  . Aspirin Other (See Comments)    G.I. Burman Foster, M.D. [facility] Pulmonary/Critical Care Medicine. Pager: [phone]. After hours pager: [phone].

## 2018-04-28 NOTE — Telephone Encounter (Signed)
-----   Message from Erick Colace, NP sent at 04/26/2018  6:26 PM EST ----- Hey guys  Can you set this woman up w/ NP follow up I'm thinking week of the 9th; then with [facility] at some point after that   Thanks!  Laurey Arrow

## 2018-04-28 NOTE — Telephone Encounter (Signed)
See staff message from Salvadore Dom.  _____________________________________________  I have tenativaly scheduled patient in time slots for both Beth and Ol[facility]. I have called both the patient and the daughter with no answer. Left messages to give [place] a call back. If times do not work for them we can reschedule, I wanted to go ahead and get them in the books.

## 2018-04-28 NOTE — Care Management Important Message (Signed)
Important Message  Patient Details  Name: Kaitlin [name] MRN: [id number] Date of Birth: 28-Jan-1951   Medicare Important Message Given:  Yes    Braley Luckenbaugh [address] 04/28/2018, 4:41 PM

## 2018-04-29 LAB — BASIC METABOLIC PANEL
Anion gap: 11 (ref 5–15)
BUN: <5 mg/dL — ABNORMAL LOW (ref 8–23)
CALCIUM: 8.9 mg/dL (ref 8.9–10.3)
CO2: 27 mmol/L (ref 22–32)
CREATININE: 0.83 mg/dL (ref 0.44–1.00)
Chloride: 97 mmol/L — ABNORMAL LOW (ref 98–111)
GFR calc Af Amer: >60 mL/min (ref >60)
GFR calc non Af Amer: >60 mL/min (ref >60)
Glucose, Bld: 114 mg/dL — ABNORMAL HIGH (ref 70–99)
Potassium: 3.9 mmol/L (ref 3.5–5.1)
Sodium: 135 mmol/L (ref 135–145)

## 2018-04-29 LAB — CULTURE, BLOOD (ROUTINE X 2)
Culture: NO GROWTH
Culture: NO GROWTH
SPECIAL REQUESTS: ADEQUATE
Special Requests: ADEQUATE

## 2018-04-29 MED ORDER — ALBUTEROL SULFATE (2.5 MG/3ML) 0.083% IN NEBU: 2.5000 mg | INHALATION_SOLUTION | Freq: Four times a day (QID) | RESPIRATORY_TRACT | 2 refills | Status: DC | PRN

## 2018-04-29 MED ORDER — TRUE METRIX METER DEVI: 1.0000 | Freq: Every day | 0 refills | Status: DC

## 2018-04-29 MED ORDER — COLCHICINE 0.6 MG PO TABS: 0.6000 mg | ORAL_TABLET | Freq: Every day | ORAL | 0 refills | Status: DC

## 2018-04-29 MED ORDER — COLCHICINE 0.6 MG PO TABS: 0.6000 mg | ORAL_TABLET | Freq: Every day | ORAL | Status: DC

## 2018-04-29 NOTE — Progress Notes (Signed)
SATURATION QUALIFICATIONS: (This note is used to comply with regulatory documentation for home oxygen)  Patient Saturations on Room Air at Rest = 100%  Patient Saturations on Room Air while Ambulating = 99%    Please briefly explain why patient needs home oxygen: Pt does not qualify for home O2.

## 2018-04-29 NOTE — Discharge Summary (Signed)
Physician Discharge Summary  Kaitlin [name] LKG:[id number] DOB: 1950-12-11 DOA: 04/24/2018  PCP: [name], MD  Admit date: 04/24/2018 Discharge date: 04/29/2018  Admitted From: home Disposition:  home  Recommendations for Outpatient Follow-up:  1. Follow up with PCP in 1-2 weeks 2. After 3 weeks of being on anticoagulation she will need to have it paused to arrange for lung mass CT-guided biopsy.  Would prefer PCP arrange biopsy and oncology follow-up  Home Health: none Equipment/Devices: none  Discharge Condition: stable CODE STATUS: Full code Diet recommendation: regular  HPI: Per Dr. Hanley Hays, Kaitlin [name] is a 67 y.o. female who has a PMH of HTN, Asthma, Carpal Tunnel.  She presented to [facility] 11/28 with dyspnea x 3 days.  Symptoms gradually worsened to the point that she started to get tachypneic.  She also complained of subjective fevers and chills, dry cough, and right sided pleuritic chest pain.  She tried using her albuterol inhaler and nebulizer at home but got minimal relief.  She did not notice any hemoptysis until she was in the ED when she had 1 -2 episodes of blood tinged sputum.  Denies lightheadedness, dizziness, LE edema, tobacco use.  No recent exposures to known sick contacts. In ED, she had CTA of the chest that demonstrated large right sided PE with possible RLL infarct vs infiltrate as well as a  LLL 3.8 x 3.7 x 2.1cm mass with hilar and mediastinal adenopathy.  She was subsequently transferred to [facility] for further evaluation and management. She denied any hx of prior malignancy, VTE, recent travel / periods of prolonged immobilization, hormone use.  Hospital Course:  Principal Problem Acute hypoxic respiratory failure due to large sided PE with possible right lower lobe infarct versus infiltrate -patient was initially admitted to the ICU she was started on IV heparin, supportive treatment with oxygen, her hypoxia improved and she was able to be weaned off to room air.   She had no evidence of bleed, and she was transitioned to Eliquis.  On the day of discharge, patient is able to ambulate in the hallway on room air, oxygen levels have remained stable, she will be discharged home in improved condition with close outpatient follow-up  Additional Problems Community-acquired pneumonia, right lower lobe pneumonia -finished a course of antibiotics while hospitalized History of asthma -continue home medications Left lower lobe mass with hilar and mediastinal lymphadenopathy -Concern for malignancy, will eventually need a biopsy but per critical care will need to be done after about 3 weeks of anticoagulation to be able to safely hold it for invasive interventions.  Will forward the DC summary to Dr. Ouida Sills Dysphagia -Followed by GI as an outpatient with plans for dilatation  Discharge Diagnoses:  Active Problems:   Pulmonary emboli (HCC)   Sepsis with acute respiratory failure without septic shock (HCC)   Lung mass   Discharge Instructions  Allergies as of 04/29/2018      Reactions   Banana Anaphylaxis   Aspirin Other (See Comments)   G.I. Upset       Medication List    TAKE these medications   albuterol (2.5 MG/3ML) 0.083% nebulizer solution Commonly known as:  PROVENTIL Take 3 mLs (2.5 mg total) by nebulization every 6 (six) hours as needed for wheezing or shortness of breath. What changed:  You were already taking a medication with the same name, and this prescription was added. Make sure you understand how and when to take each.   albuterol 108 (90 Base)  MCG/ACT inhaler Commonly known as:  PROVENTIL HFA;VENTOLIN HFA Inhale 2 puffs into the lungs every 6 (six) hours as needed for wheezing. What changed:  Another medication with the same name was added. Make sure you understand how and when to take each.   amLODipine 5 MG tablet Commonly known as:  NORVASC Take 5 mg by mouth daily.   chlorthalidone 25 MG tablet Commonly known as:  HYGROTON Take  25 mg by mouth daily.   colchicine 0.6 MG tablet Take 1 tablet (0.6 mg total) by mouth daily.   ELIQUIS STARTER PACK 5 MG Tabs Take as directed on package: start with two-5mg  tablets twice daily for 7 days. On day 8, switch to one-5mg  tablet twice daily.   Fluticasone-Salmeterol 100-50 MCG/DOSE Aepb Commonly known as:  ADVAIR Inhale 1 puff into the lungs 2 (two) times daily.   losartan 100 MG tablet Commonly known as:  COZAAR Take 1 tablet (100 mg total) by mouth daily.   meloxicam 7.5 MG tablet Commonly known as:  MOBIC Take 1 tablet by mouth 2 (two) times daily.   metFORMIN 500 MG tablet Commonly known as:  GLUCOPHAGE Take 500 mg by mouth daily as needed.   pantoprazole 40 MG tablet Commonly known as:  PROTONIX Take 40 mg by mouth daily.   TRUE METRIX METER Devi 1 Device by Does not apply route daily.      Follow-up Information    [name], MD. Schedule an appointment as soon as possible for a visit in 1 week(s).   Specialty:  Internal Medicine Why:  Appointment: May 13, 2018 @ 12:15 pm Contact information: [address] [place] [address] [phone]           Consultations:  Pulmonary   Procedures/Studies:  2D echo  Impressions: - LVEF 60-65%, normal wall thickness, normal wall motion, grade 1 DD, indeterminate LV filling pressure, trivial MR, normal LA size, normal RV size and systolic function, mild TR, RVSP 35 mmHg, IVC suggests elevated RA pressure of 8 mmHg, small pericardial effusion without tamponade.   Dg Chest 2 View  Result Date: 04/24/2018 CLINICAL DATA:  Productive cough, shortness of breath, RIGHT-side chest pain and back pain for 2 days, history asthma, hypertension EXAM: CHEST - 2 VIEW COMPARISON:  08/05/2008 FINDINGS: Normal heart size and mediastinal contours. Minimal fluid or thickening at the minor fissure. RIGHT lower lobe infiltrate consistent with pneumonia. Remaining lungs clear. No pleural effusion or  pneumothorax. IMPRESSION: RIGHT lower lobe pneumonia. Electronically Signed   By: Ulyses Southward M.D.   On: 04/24/2018 15:03   Ct Angio Chest Pe W Or Wo Contrast  Addendum Date: 04/24/2018   ADDENDUM REPORT: 04/24/2018 17:10 ADDENDUM: Elevated RV/LV ratio = 1.42 indicating RIGHT heart strain. Positive for acute PE with CT evidence of right heart strain (RV/LV Ratio = 1.42) consistent with at least submassive (intermediate risk) PE. The presence of right heart strain has been associated with an increased risk of morbidity and mortality. Please activate Code PE by paging [phone]. Electronically Signed   By: Ulyses Southward M.D.   On: 04/24/2018 17:10   Result Date: 04/24/2018 CLINICAL DATA:  Productive cough, shortness of breath and RIGHT-sided pain for 2 days, history of diabetes mellitus, hysterectomy, elevated D-dimer, pulmonary nodule EXAM: CT ANGIOGRAPHY CHEST WITH CONTRAST TECHNIQUE: Multidetector CT imaging of the chest was performed using the standard protocol during bolus administration of intravenous contrast. Multiplanar CT image reconstructions and MIPs were obtained to evaluate the vascular anatomy. CONTRAST:  ISOVUE-370 IOPAMIDOL (ISOVUE-370) INJECTION 76% IV COMPARISON:  PET/CT 06/06/2006 FINDINGS: Cardiovascular: Aorta normal caliber without aneurysm or dissection. Small pericardial effusion. Small pulmonary embolus within LEFT upper lobe pulmonary artery. Large embolus identified at the proximal descending interlobar pulmonary artery extending into RIGHT middle and RIGHT lower lobes. No LEFT-sided pulmonary emboli identified. Mediastinum/Nodes: Base of cervical region normal appearance. RIGHT hilar adenopathy with nodes up to 15 mm short axis. Subcarinal adenopathy. Additional borderline enlarged LEFT para-aortic, LEFT paratracheal, and LEFT hilar nodes. Lungs/Pleura: Focal LEFT lower lobe opacity with irregular margins question neoplasm, bilobed, 3.7 x 2.1 x 3.8 cm, abutting the posterior  chest wall over a broad surface. Small to moderate-sized loculated RIGHT pleural effusion. Diffuse infiltrate throughout RIGHT lower lobe which could represent pneumonia or pulmonary infarct. Fluid/thickening of the major and minor fissures. No additional pulmonary mass/nodule. Central peribronchial thickening. Upper Abdomen: Thickening of both adrenal glands without discrete mass. Remaining visualized upper abdomen unremarkable. Musculoskeletal: Scattered degenerative disc disease changes thoracic spine. No definite metastatic bone lesions identified. Review of the MIP images confirms the above findings. IMPRESSION: Large pulmonary embolus in descending interlobar pulmonary artery with occlusion of RIGHT lower lobe pulmonary artery and additional smaller clots noted in RIGHT middle and RIGHT upper lobes. Associated RIGHT pleural effusion and atelectasis with diffuse RIGHT lower lobe infiltrate which could represent pneumonia or infarct. LEFT lower lobe mass 3.8 x 3.7 x 2.1 cm associated with hilar and mediastinal adenopathy highly suspicious for a pulmonary neoplasm. Findings discussed with Dr. Hyacinth Meeker on 04/24/2018 at 1655 hours Electronically Signed: By: Ulyses Southward M.D. On: 04/24/2018 17:06   Vas [place] Lower Extremity Venous (dvt)  Result Date: 04/25/2018  Lower Venous Study Indications: Pulmonary embolism, and SOB.  Limitations: Body habitus. Performing Technologist: Annamaria Helling  Examination Guidelines: A complete evaluation includes B-mode imaging, spectral Doppler, color Doppler, and power Doppler as needed of all accessible portions of each vessel. Bilateral testing is considered an integral part of a complete examination. Limited examinations for reoccurring indications may be performed as noted.  Right Venous Findings: +---------+---------------+---------+-----------+----------+-----------------+          CompressibilityPhasicitySpontaneityPropertiesSummary            +---------+---------------+---------+-----------+----------+-----------------+ CFV      Full           Yes      Yes                                    +---------+---------------+---------+-----------+----------+-----------------+ SFJ      Full                                                           +---------+---------------+---------+-----------+----------+-----------------+ FV Prox  Full                                                           +---------+---------------+---------+-----------+----------+-----------------+ FV Mid   Full                                                           +---------+---------------+---------+-----------+----------+-----------------+  FV DistalFull                                                           +---------+---------------+---------+-----------+----------+-----------------+ PFV      Full                                                           +---------+---------------+---------+-----------+----------+-----------------+ POP      Full           Yes      Yes                                    +---------+---------------+---------+-----------+----------+-----------------+ PTV      Partial                 No                   Age Indeterminate +---------+---------------+---------+-----------+----------+-----------------+ PERO     Partial                 No                   Age Indeterminate +---------+---------------+---------+-----------+----------+-----------------+  Left Venous Findings: +---------+---------------+---------+-----------+----------+-----------------+          CompressibilityPhasicitySpontaneityPropertiesSummary           +---------+---------------+---------+-----------+----------+-----------------+ CFV      Full           Yes      Yes                                    +---------+---------------+---------+-----------+----------+-----------------+ SFJ      Full                                                            +---------+---------------+---------+-----------+----------+-----------------+ FV Prox  Full                                                           +---------+---------------+---------+-----------+----------+-----------------+ FV Mid   Full                                                           +---------+---------------+---------+-----------+----------+-----------------+ FV DistalFull                                                           +---------+---------------+---------+-----------+----------+-----------------+  PFV      Full                                                           +---------+---------------+---------+-----------+----------+-----------------+ POP      Full           Yes      Yes                                    +---------+---------------+---------+-----------+----------+-----------------+ PTV      Partial                 No                   Age Indeterminate +---------+---------------+---------+-----------+----------+-----------------+ PERO     Partial                 No                   Age Indeterminate +---------+---------------+---------+-----------+----------+-----------------+    Summary: Right: Findings consistent with age indeterminate deep vein thrombosis involving the right posterior tibial vein, and right peroneal vein. No cystic structure found in the popliteal fossa. Left: Findings consistent with age indeterminate deep vein thrombosis involving the left posterior tibial vein, and left peroneal vein. No cystic structure found in the popliteal fossa.  *See table(s) above for measurements and observations. Electronically signed by Lemar Livings MD on 04/25/2018 at 3:36:07 PM.    Final       Subjective: - no chest pain, shortness of breath, no abdominal pain, nausea or vomiting.   Discharge Exam: Vitals:   04/29/18 0731 04/29/18 0916  BP: 126/84   Pulse: 86     Resp:    Temp: 98.6 F (37 C)   SpO2: 96% 95%   General: Pt is alert, awake, not in acute distress Cardiovascular: RRR, S1/S2 +, no rubs, no gallops Respiratory: CTA bilaterally, no wheezing, no rhonchi Abdominal: Soft, NT, ND, bowel sounds + Extremities: no edema, no cyanosis  The results of significant diagnostics from this hospitalization (including imaging, microbiology, ancillary and laboratory) are listed below for reference.     Microbiology: Recent Results (from the past 240 hour(s))  Blood Culture (routine x 2)     Status: None   Collection Time: 04/24/18  1:50 PM  Result Value Ref Range Status   Specimen Description RIGHT ANTECUBITAL  Final   Special Requests   Final    BOTTLES DRAWN AEROBIC AND ANAEROBIC Blood Culture adequate volume   Culture   Final    NO GROWTH 5 DAYS Performed at [facility], [address] [place], [place] [address]    Report Status 04/29/2018 FINAL  Final  Blood Culture (routine x 2)     Status: None   Collection Time: 04/24/18  2:14 PM  Result Value Ref Range Status   Specimen Description LEFT ANTECUBITAL  Final   Special Requests   Final    BOTTLES DRAWN AEROBIC AND ANAEROBIC Blood Culture adequate volume   Culture   Final    NO GROWTH 5 DAYS Performed at [facility], [address], [place], [place] [address]    Report Status 04/29/2018 FINAL  Final  MRSA PCR Screening  Status: None   Collection Time: 04/24/18  7:52 PM  Result Value Ref Range Status   MRSA by PCR NEGATIVE NEGATIVE Final    Comment:        The GeneXpert MRSA Assay (FDA approved for NASAL specimens only), is one component of a comprehensive MRSA colonization surveillance program. It is not intended to diagnose MRSA infection nor to guide or monitor treatment for MRSA infections. Performed at [facility], [address], [place], [place] [address]   Expectorated sputum assessment w rflx to resp cult     Status: None   Collection Time: 04/24/18  9:42  PM  Result Value Ref Range Status   Specimen Description SPUTUM  Final   Special Requests NONE  Final   Sputum evaluation   Final    Sputum specimen not acceptable for testing.  Please recollect.   RESULT CALLED TO, READ BACK BY AND VERIFIED WITH: A SAWYERS RN [phone] 04/25/18 A BROWNING Performed at [facility], [address], [place], [place] [address]    Report Status 04/25/2018 FINAL  Final     Labs: BNP (last 3 results) No results for input(s): BNP in the last 8760 hours. Basic Metabolic Panel: Recent Labs  Lab 04/24/18 1347 04/25/18 0539 04/28/18 0219 04/29/18 0216  NA 130* 136 134* 135  K 2.9* 3.5 3.0* 3.9  CL 93* 106 95* 97*  CO2 23 19* 27 27  GLUCOSE 109* 161* 108* 114*  BUN 18 11 6* <5*  CREATININE 1.03* 0.99 0.72 0.83  CALCIUM 8.9 8.1* 8.7* 8.9   Liver Function Tests: Recent Labs  Lab 04/24/18 1347  AST 54*  ALT 44  ALKPHOS 188*  BILITOT 1.9*  PROT 7.8  ALBUMIN 3.5   No results for input(s): LIPASE, AMYLASE in the last 168 hours. No results for input(s): AMMONIA in the last 168 hours. CBC: Recent Labs  Lab 04/24/18 1347  04/26/18 1236 04/27/18 0322 04/27/18 1332 04/28/18 0219 04/28/18 1331  WBC 17.6*   < > 14.2* 13.2* 13.3* 13.4* 13.1*  NEUTROABS 14.6*  --   --   --   --   --   --   HGB 11.6*   < > 8.9* 9.0* 9.2* 8.7* 9.0*  HCT 36.2   < > 29.1* 29.5* 28.8* 27.0* 29.0*  MCV 83.8   < > 84.8 84.8 85.0 83.1 83.8  PLT 377   < > 388 398 402* 441* 520*   < > = values in this interval not displayed.   Cardiac Enzymes: Recent Labs  Lab 04/24/18 2314 04/25/18 0539 04/25/18 1214  TROPONINI <0.03 <0.03 <0.03   BNP: Invalid input(s): POCBNP CBG: Recent Labs  Lab 04/27/18 2121 04/28/18 0717 04/28/18 1116 04/28/18 1628 04/28/18 2224  GLUCAP 85 90 99 108* 121*   D-Dimer No results for input(s): DDIMER in the last 72 hours. Hgb A1c No results for input(s): HGBA1C in the last 72 hours. Lipid Profile No results for input(s): CHOL,  HDL, LDLCALC, TRIG, CHOLHDL, LDLDIRECT in the last 72 hours. Thyroid function studies No results for input(s): TSH, T4TOTAL, T3FREE, THYROIDAB in the last 72 hours.  Invalid input(s): FREET3 Anemia work up No results for input(s): VITAMINB12, FOLATE, FERRITIN, TIBC, IRON, RETICCTPCT in the last 72 hours. Urinalysis    Component Value Date/Time   COLORURINE YELLOW 04/11/2007 1936   APPEARANCEUR CLEAR 04/11/2007 1936   LABSPEC 1.015 04/11/2007 1936   PHURINE 5.5 04/11/2007 1936   GLUCOSEU NEGATIVE 04/11/2007 1936  HGBUR LARGE (A) 04/11/2007 1936   BILIRUBINUR NEGATIVE 04/11/2007 1936   KETONESUR NEGATIVE 04/11/2007 1936   PROTEINUR 100 (A) 04/11/2007 1936   UROBILINOGEN 0.2 04/11/2007 1936   NITRITE POSITIVE (A) 04/11/2007 1936   LEUKOCYTESUR TRACE (A) 04/11/2007 1936   Sepsis Labs Invalid input(s): PROCALCITONIN,  WBC,  LACTICIDVEN   Time coordinating discharge: 35 minutes  SIGNED:  Pamella Pert, MD  [facility] 04/29/2018, 3:09 PM Pager [phone]  If 7PM-7AM, please contact night-coverage [email or web address] Password [email or web address]

## 2018-04-29 NOTE — Progress Notes (Signed)
Pharmacy - Eliquis  Eliquis discharge kit provided from [facility] [employer] completed  Thank you Anette Guarneri, PharmD

## 2018-05-01 ENCOUNTER — Ambulatory Visit: Payer: Medicare HMO | Admitting: Gastroenterology

## 2018-05-01 NOTE — Telephone Encounter (Signed)
Patient returned call and and stated appts scheduled are fine.  No call back is required.

## 2018-05-02 DIAGNOSIS — R229 Localized swelling, mass and lump, unspecified: Secondary | ICD-10-CM | POA: Diagnosis not present

## 2018-05-02 DIAGNOSIS — I2699 Other pulmonary embolism without acute cor pulmonale: Secondary | ICD-10-CM | POA: Diagnosis not present

## 2018-05-05 ENCOUNTER — Encounter: Payer: Self-pay | Admitting: Primary Care

## 2018-05-05 ENCOUNTER — Telehealth: Payer: Self-pay | Admitting: Primary Care

## 2018-05-05 ENCOUNTER — Ambulatory Visit (INDEPENDENT_AMBULATORY_CARE_PROVIDER_SITE_OTHER): Payer: Medicare HMO | Admitting: Primary Care

## 2018-05-05 DIAGNOSIS — R918 Other nonspecific abnormal finding of lung field: Secondary | ICD-10-CM

## 2018-05-05 DIAGNOSIS — R911 Solitary pulmonary nodule: Secondary | ICD-10-CM | POA: Diagnosis not present

## 2018-05-05 DIAGNOSIS — R1310 Dysphagia, unspecified: Secondary | ICD-10-CM | POA: Insufficient documentation

## 2018-05-05 MED ORDER — BENZONATATE 200 MG PO CAPS: 200.0000 mg | ORAL_CAPSULE | Freq: Three times a day (TID) | ORAL | 1 refills | Status: DC | PRN

## 2018-05-05 NOTE — Progress Notes (Signed)
@Patient  ID: Kaitlin [name], female    DOB: [birth year], 67 y.o.   MRN: [id number]  Chief Complaint  Patient presents with  . Follow-up    SOB with exertion, cough with white and red mucus, chest tightness and wheezing better since hospital    Referring provider: [name], MD  HPI: 67 year old female, never smoked. PMH significant for asthma]. Recent hospital admission from 11/28-12/3 for new PE and left lower lung mass.  Hospital course: Patient originally presented with cough and worsening dyspnea for 3 days Associated tachycardia . Hemoptysis observed in ED. CTA of chest showed large right sided PE with possible RLL infarct vs infiltrate as well as left lower lobe 3.8 x 3.7 x 2.1 cm mass with hilar and mediastinal adenopathy. No history of malignancy. Initally admitted to ICU where she was started on IV heparin and received supportive treatment with oxygen. Treated with azithromycin course. Weaned to room air and was transitioned to Eliquis. Plan is for patient to be on anticoagulation for 3 week and then needs CT guided biopsy of left lower lung mass.   05/05/2018 Patient presents today for hospital follow-up. Accompanied by family friend/care taker. Breathing better since hospitalization. Continues to have shortness of breath on exertion but improved. Still has cough with white and pink tinged mucus. She is taking Eliquis as prescribed for new PE. Reports that she has lost weight. Associated nausea. Drinking ensures.   Discussed with Dr. Wynona Neat today in clinic, d/t concern for malignancy recommend CT guided biopsy. Ok to hold Eliquis 48 hours prior to procedure after being on treatment for minimum of 3 weeks (after 12/23). Needs PET scan and referral to medical oncology. Also needs to reschedule endoscopy with Dr. Stan Head planned for 12/13 if she is high risk for bleeding during procedure.    Allergies  Allergen Reactions  . Banana Anaphylaxis  . Aspirin Other (See Comments)   G.I. Upset      There is no immunization history on file for this patient.  Past Medical History:  Diagnosis Date  . Asthma   . Carpal tunnel syndrome   . Hypertension     Tobacco History: Social History   Tobacco Use  Smoking Status Never Smoker  Smokeless Tobacco Never Used   Counseling given: Not Answered   Outpatient Medications Prior to Visit  Medication Sig Dispense Refill  . albuterol (PROVENTIL HFA;VENTOLIN HFA) 108 (90 BASE) MCG/ACT inhaler Inhale 2 puffs into the lungs every 6 (six) hours as needed for wheezing.    Marland Kitchen albuterol (PROVENTIL) (2.5 MG/3ML) 0.083% nebulizer solution Take 3 mLs (2.5 mg total) by nebulization every 6 (six) hours as needed for wheezing or shortness of breath. 75 mL 2  . amLODipine (NORVASC) 5 MG tablet Take 5 mg by mouth daily.    . Blood Glucose Monitoring Suppl (TRUE METRIX METER) DEVI 1 Device by Does not apply route daily. 1 Device 0  . chlorthalidone (HYGROTON) 25 MG tablet Take 25 mg by mouth daily.    Marland Kitchen ELIQUIS STARTER PACK (ELIQUIS STARTER PACK) 5 MG TABS Take as directed on package: start with two-5mg  tablets twice daily for 7 days. On day 8, switch to one-5mg  tablet twice daily. 1 each 0  . Fluticasone-Salmeterol (ADVAIR) 100-50 MCG/DOSE AEPB Inhale 1 puff into the lungs 2 (two) times daily.    Marland Kitchen LORazepam (ATIVAN) 0.5 MG tablet     . losartan (COZAAR) 100 MG tablet Take 1 tablet (100 mg total) by mouth daily. 30  tablet 0  . meloxicam (MOBIC) 7.5 MG tablet Take 1 tablet by mouth 2 (two) times daily.  0  . colchicine 0.6 MG tablet Take 1 tablet (0.6 mg total) by mouth daily. (Patient not taking: Reported on 05/05/2018) 4 tablet 0  . metFORMIN (GLUCOPHAGE) 500 MG tablet Take 500 mg by mouth daily as needed.     . pantoprazole (PROTONIX) 40 MG tablet Take 40 mg by mouth daily.     No facility-administered medications prior to visit.     Review of Systems  Review of Systems  Constitutional: Positive for unexpected weight change.  Negative for fever.  Respiratory: Positive for cough. Negative for shortness of breath and wheezing.   Cardiovascular: Negative.   Gastrointestinal: Positive for nausea. Negative for abdominal pain, blood in stool and vomiting.    Physical Exam  BP 104/68 (BP Location: Left Arm, Cuff Size: Normal)   Pulse 90   Temp 98.5 F (36.9 C)   Ht 5\' 6"  (1.676 m)   Wt 193 lb (87.5 kg)   SpO2 100%   BMI 31.15 kg/m  Physical Exam  Constitutional: She is oriented to person, place, and time. She appears well-developed and well-nourished. No distress.  Appears chronically ill   HENT:  Head: Normocephalic and atraumatic.  Eyes: Pupils are equal, round, and reactive to light. EOM are normal.  Neck: Normal range of motion.  Cardiovascular: Normal rate and regular rhythm.  Pulmonary/Chest: Effort normal and breath sounds normal. No respiratory distress. She has no wheezes. She has no rales.  CTA bilaterally   Musculoskeletal: Normal range of motion.  Neurological: She is alert and oriented to person, place, and time.  Skin: Skin is warm and dry.  Psychiatric: She has a normal mood and affect. Her behavior is normal. Judgment and thought content normal.     Lab Results:  CBC    Component Value Date/Time   WBC 13.1 (H) 04/28/2018 1331   RBC 3.46 (L) 04/28/2018 1331   HGB 9.0 (L) 04/28/2018 1331   HCT 29.0 (L) 04/28/2018 1331   PLT 520 (H) 04/28/2018 1331   MCV 83.8 04/28/2018 1331   MCH 26.0 04/28/2018 1331   MCHC 31.0 04/28/2018 1331   RDW 13.9 04/28/2018 1331   LYMPHSABS 0.9 04/24/2018 1347   MONOABS 1.9 (H) 04/24/2018 1347   EOSABS 0.0 04/24/2018 1347   BASOSABS 0.0 04/24/2018 1347    BMET    Component Value Date/Time   NA 135 04/29/2018 0216   K 3.9 04/29/2018 0216   CL 97 (L) 04/29/2018 0216   CO2 27 04/29/2018 0216   GLUCOSE 114 (H) 04/29/2018 0216   BUN <5 (L) 04/29/2018 0216   CREATININE 0.83 04/29/2018 0216   CALCIUM 8.9 04/29/2018 0216   GFRNONAA >60 04/29/2018  0216   GFRAA >60 04/29/2018 0216    BNP No results found for: BNP  ProBNP No results found for: PROBNP  Imaging: Dg Chest 2 View  Result Date: 04/24/2018 CLINICAL DATA:  Productive cough, shortness of breath, RIGHT-side chest pain and back pain for 2 days, history asthma, hypertension EXAM: CHEST - 2 VIEW COMPARISON:  08/05/2008 FINDINGS: Normal heart size and mediastinal contours. Minimal fluid or thickening at the minor fissure. RIGHT lower lobe infiltrate consistent with pneumonia. Remaining lungs clear. No pleural effusion or pneumothorax. IMPRESSION: RIGHT lower lobe pneumonia. Electronically Signed   By: Ulyses Southward M.D.   On: 04/24/2018 15:03   Ct Angio Chest Pe W Or Wo Contrast  Addendum Date: 04/24/2018  ADDENDUM REPORT: 04/24/2018 17:10 ADDENDUM: Elevated RV/LV ratio = 1.42 indicating RIGHT heart strain. Positive for acute PE with CT evidence of right heart strain (RV/LV Ratio = 1.42) consistent with at least submassive (intermediate risk) PE. The presence of right heart strain has been associated with an increased risk of morbidity and mortality. Please activate Code PE by paging [phone]. Electronically Signed   By: Ulyses Southward M.D.   On: 04/24/2018 17:10   Result Date: 04/24/2018 CLINICAL DATA:  Productive cough, shortness of breath and RIGHT-sided pain for 2 days, history of diabetes mellitus, hysterectomy, elevated D-dimer, pulmonary nodule EXAM: CT ANGIOGRAPHY CHEST WITH CONTRAST TECHNIQUE: Multidetector CT imaging of the chest was performed using the standard protocol during bolus administration of intravenous contrast. Multiplanar CT image reconstructions and MIPs were obtained to evaluate the vascular anatomy. CONTRAST:  ISOVUE-370 IOPAMIDOL (ISOVUE-370) INJECTION 76% IV COMPARISON:  PET/CT 06/06/2006 FINDINGS: Cardiovascular: Aorta normal caliber without aneurysm or dissection. Small pericardial effusion. Small pulmonary embolus within LEFT upper lobe pulmonary  artery. Large embolus identified at the proximal descending interlobar pulmonary artery extending into RIGHT middle and RIGHT lower lobes. No LEFT-sided pulmonary emboli identified. Mediastinum/Nodes: Base of cervical region normal appearance. RIGHT hilar adenopathy with nodes up to 15 mm short axis. Subcarinal adenopathy. Additional borderline enlarged LEFT para-aortic, LEFT paratracheal, and LEFT hilar nodes. Lungs/Pleura: Focal LEFT lower lobe opacity with irregular margins question neoplasm, bilobed, 3.7 x 2.1 x 3.8 cm, abutting the posterior chest wall over a broad surface. Small to moderate-sized loculated RIGHT pleural effusion. Diffuse infiltrate throughout RIGHT lower lobe which could represent pneumonia or pulmonary infarct. Fluid/thickening of the major and minor fissures. No additional pulmonary mass/nodule. Central peribronchial thickening. Upper Abdomen: Thickening of both adrenal glands without discrete mass. Remaining visualized upper abdomen unremarkable. Musculoskeletal: Scattered degenerative disc disease changes thoracic spine. No definite metastatic bone lesions identified. Review of the MIP images confirms the above findings. IMPRESSION: Large pulmonary embolus in descending interlobar pulmonary artery with occlusion of RIGHT lower lobe pulmonary artery and additional smaller clots noted in RIGHT middle and RIGHT upper lobes. Associated RIGHT pleural effusion and atelectasis with diffuse RIGHT lower lobe infiltrate which could represent pneumonia or infarct. LEFT lower lobe mass 3.8 x 3.7 x 2.1 cm associated with hilar and mediastinal adenopathy highly suspicious for a pulmonary neoplasm. Findings discussed with Dr. Hyacinth Meeker on 04/24/2018 at 1655 hours Electronically Signed: By: Ulyses Southward M.D. On: 04/24/2018 17:06   Vas [place] Lower Extremity Venous (dvt)  Result Date: 04/25/2018  Lower Venous Study Indications: Pulmonary embolism, and SOB.  Limitations: Body habitus. Performing Technologist:  Annamaria Helling  Examination Guidelines: A complete evaluation includes B-mode imaging, spectral Doppler, color Doppler, and power Doppler as needed of all accessible portions of each vessel. Bilateral testing is considered an integral part of a complete examination. Limited examinations for reoccurring indications may be performed as noted.  Right Venous Findings: +---------+---------------+---------+-----------+----------+-----------------+          CompressibilityPhasicitySpontaneityPropertiesSummary           +---------+---------------+---------+-----------+----------+-----------------+ CFV      Full           Yes      Yes                                    +---------+---------------+---------+-----------+----------+-----------------+ SFJ      Full                                                           +---------+---------------+---------+-----------+----------+-----------------+  FV Prox  Full                                                           +---------+---------------+---------+-----------+----------+-----------------+ FV Mid   Full                                                           +---------+---------------+---------+-----------+----------+-----------------+ FV DistalFull                                                           +---------+---------------+---------+-----------+----------+-----------------+ PFV      Full                                                           +---------+---------------+---------+-----------+----------+-----------------+ POP      Full           Yes      Yes                                    +---------+---------------+---------+-----------+----------+-----------------+ PTV      Partial                 No                   Age Indeterminate +---------+---------------+---------+-----------+----------+-----------------+ PERO     Partial                 No                   Age Indeterminate  +---------+---------------+---------+-----------+----------+-----------------+  Left Venous Findings: +---------+---------------+---------+-----------+----------+-----------------+          CompressibilityPhasicitySpontaneityPropertiesSummary           +---------+---------------+---------+-----------+----------+-----------------+ CFV      Full           Yes      Yes                                    +---------+---------------+---------+-----------+----------+-----------------+ SFJ      Full                                                           +---------+---------------+---------+-----------+----------+-----------------+ FV Prox  Full                                                           +---------+---------------+---------+-----------+----------+-----------------+  FV Mid   Full                                                           +---------+---------------+---------+-----------+----------+-----------------+ FV DistalFull                                                           +---------+---------------+---------+-----------+----------+-----------------+ PFV      Full                                                           +---------+---------------+---------+-----------+----------+-----------------+ POP      Full           Yes      Yes                                    +---------+---------------+---------+-----------+----------+-----------------+ PTV      Partial                 No                   Age Indeterminate +---------+---------------+---------+-----------+----------+-----------------+ PERO     Partial                 No                   Age Indeterminate +---------+---------------+---------+-----------+----------+-----------------+    Summary: Right: Findings consistent with age indeterminate deep vein thrombosis involving the right posterior tibial vein, and right peroneal vein. No cystic structure found in the  popliteal fossa. Left: Findings consistent with age indeterminate deep vein thrombosis involving the left posterior tibial vein, and left peroneal vein. No cystic structure found in the popliteal fossa.  *See table(s) above for measurements and observations. Electronically signed by Lemar Livings MD on 04/25/2018 at 3:36:07 PM.    Final      Assessment & Plan:  67 year old female, never smoked. No significant past medical history prior to recent hospitalization. Admitted from 11/28-12/3 for worsening dyspnea x 3 days. Found to be tachycardic and observed hemoptysis. CTA showed new large PE with possible RLL infarct vs infiltrate as well as left lower lobe 3.8 x 3.7 x 2.1 cm mass with hilar and mediastinal adenopathy. Treated with abx. Presents today for hospital follow-up. She is feeling better, less short of breath. VSS. Still has cough with clear to pink tinged mucus. Continues on Eliquis 5mg  BID. Needs CT guided biopsy for new LLL lung mass, concern for malignancy. Need PETs scan first. Pt does not want to come off Eliquis for procedure d/t risks associated. Elected for admission to hospital prior to biopsy for anticoagulation bridge. Will need to get date for procedure and then likely Dr. Wynona Neat will have to pre-admit for observation and possible heaprin gtt.  Pulmonary emboli (HCC) - Continue Eliquis 5mg  twice daily  - Some improvement in dyspnea,  continues to have cough  - Needs 3 weeks minimum anticoagulation treatment before CT guided biopsy  - Pt does not want to come off anticoagulation 48 hours prior to procedure d/t associated risks, discussed with Dr. Wynona Neat and will likely need to be pre-admitted prior to bridge anticoagulation with heparin gtt   Lung mass - CTA 11/128 showed large right sided PE with possible RLL infarct vs infiltrate as well as left lower lobe 3.8 x 3.7 x 2.1 cm mass with hilar and mediastinal adenopathy - Concern for malignancy, needs PET scan and CT guided biopsy  per Dr. Wynona Neat  - Refer med/oncology   Dysphagia - If patient is high risk for bleeding/hemorrhage with endoscopy patient will need to reschedule procedure with Dr. Stan Head planned for 12/13.  - Will reach our to GI office to let them know  45 min spent on case, >50% time face to face    Glenford Bayley, NP 05/05/2018

## 2018-05-05 NOTE — Assessment & Plan Note (Signed)
-   If patient is high risk for bleeding/hemorrhage with endoscopy patient will need to reschedule procedure with Dr. Silvano Rusk planned for 12/13.  - Will reach our to GI office to let them know

## 2018-05-05 NOTE — Assessment & Plan Note (Signed)
-   CTA 11/128 showed large right sided PE with possible RLL infarct vs infiltrate as well as left lower lobe 3.8 x 3.7 x 2.1 cm mass with hilar and mediastinal adenopathy - Concern for malignancy, needs PET scan and CT guided biopsy per Dr. Ander Slade  - Refer med/oncology

## 2018-05-05 NOTE — Assessment & Plan Note (Signed)
-   Continue Eliquis 5mg  twice daily  - Some improvement in dyspnea, continues to have cough  - Needs 3 weeks minimum anticoagulation treatment before CT guided biopsy  - Pt does not want to come off anticoagulation 48 hours prior to procedure d/t associated risks, discussed with Dr. Ander Slade and will likely need to be pre-admitted prior to bridge anticoagulation with heparin gtt

## 2018-05-05 NOTE — Patient Instructions (Addendum)
Cough recommendations: Take delsym cough syrup as needed for cough  Tessalon perles three times a day for cough   Orders: PET scan re: left lower lung mass  Needs CT guided biopsy for left lower lung mass in 2 weeks (any time after 05/19/18)   Will need admission for anticoagulation/ bridge prior to biopsy    Referral/ follow-up: Refer to medical oncology  Follow with Dr. Ander Slade in 4-6 weeks

## 2018-05-05 NOTE — Telephone Encounter (Signed)
Dr. Silvano Rusk office,  We have a mutual patient by the name of Kaitlin [name]. She is scheduled for endoscopy with your office on 12/13.   Patient is currently on Eliquis for new PE. She will need to reschedule endoscopy procedure for 6-12 weeks from now when she can safely hold anticoagulation.   Thank you, Geraldo Pitter NP [place] [facility]

## 2018-05-06 NOTE — Telephone Encounter (Signed)
Thanks for the update  This is quite elective -   My staff will instruct her to set up a follow-up appointment for March

## 2018-05-07 ENCOUNTER — Telehealth: Payer: Self-pay | Admitting: Primary Care

## 2018-05-07 NOTE — Telephone Encounter (Signed)
Pt's sister Judson Roch is calling back [phone]

## 2018-05-07 NOTE — Telephone Encounter (Signed)
Spoke with Judson Roch- aware of message below; nothing further needed at this time.

## 2018-05-07 NOTE — Telephone Encounter (Signed)
LMTCB-does not appear that anyone from our office called patient. I do see the following message from GI from today.  May have been that office.   May 07, 2018  Marlon Pel, RN       2:57 PM  Note    Procedure had been cancelled.  I left a detailed message we will send her a reminder letter in Feb to set up an appt in March

## 2018-05-07 NOTE — Telephone Encounter (Signed)
Procedure had been cancelled.  I left a detailed message we will send her a reminder letter in Feb to set up an appt in March

## 2018-05-09 ENCOUNTER — Encounter: Payer: Medicare HMO | Admitting: Internal Medicine

## 2018-05-14 ENCOUNTER — Ambulatory Visit (HOSPITAL_COMMUNITY)
Admission: RE | Admit: 2018-05-14 | Discharge: 2018-05-14 | Disposition: A | Discharge status: Home / Self Care | Payer: Medicare HMO | Source: Ambulatory Visit | Attending: Primary Care | Admitting: Primary Care

## 2018-05-14 DIAGNOSIS — R911 Solitary pulmonary nodule: Secondary | ICD-10-CM | POA: Insufficient documentation

## 2018-05-14 DIAGNOSIS — R918 Other nonspecific abnormal finding of lung field: Secondary | ICD-10-CM | POA: Diagnosis not present

## 2018-05-14 LAB — GLUCOSE, CAPILLARY: Glucose-Capillary: 107 mg/dL — ABNORMAL HIGH (ref 70–99)

## 2018-05-14 MED ORDER — FLUDEOXYGLUCOSE F - 18 (FDG) INJECTION
9.6700 | Freq: Once | INTRAVENOUS | Status: AC
  Administered 2018-05-14: 9.67 via INTRAVENOUS

## 2018-05-16 ENCOUNTER — Telehealth: Payer: Self-pay | Admitting: Primary Care

## 2018-05-16 NOTE — Telephone Encounter (Signed)
Called patient to review recent PET scan results, left VM to call back. She has apt with Dr. Ander Slade on 05/27/18.

## 2018-05-19 ENCOUNTER — Inpatient Hospital Stay (HOSPITAL_COMMUNITY): Admission: RE | Admit: 2018-05-19 | Payer: Self-pay | Source: Ambulatory Visit

## 2018-05-19 ENCOUNTER — Other Ambulatory Visit: Payer: Self-pay

## 2018-05-19 ENCOUNTER — Inpatient Hospital Stay (HOSPITAL_COMMUNITY): Payer: Medicare HMO | Attending: Hematology | Admitting: Hematology

## 2018-05-19 ENCOUNTER — Encounter (HOSPITAL_COMMUNITY): Payer: Self-pay | Admitting: Hematology

## 2018-05-19 VITALS — BP 147/93 | HR 109 | Temp 98.4°F | Resp 20 | Ht 65.75 in | Wt 185.3 lb

## 2018-05-19 DIAGNOSIS — I2699 Other pulmonary embolism without acute cor pulmonale: Secondary | ICD-10-CM

## 2018-05-19 DIAGNOSIS — R918 Other nonspecific abnormal finding of lung field: Secondary | ICD-10-CM | POA: Diagnosis not present

## 2018-05-19 DIAGNOSIS — Z7901 Long term (current) use of anticoagulants: Secondary | ICD-10-CM

## 2018-05-19 NOTE — Assessment & Plan Note (Addendum)
1.  Left lung mass: - Presented to the ER on 04/24/2018 with shortness of breath. -CT PE protocol showed large pulmonary embolus in the descending interlobar pulmonary artery with occlusion of the right lower lobe pulmonary artery and additional small clots noted in the right middle lobe and right upper lobes with associated right pleural effusion and right lower lobe infarct.  It also showed incidental left lower lobe mass measuring 3.8 x 3.7 x 2.1 cm associated with hilar and mediastinal adenopathy. - She was started on anticoagulation and was discharged home on 04/29/2018 on Eliquis.  She is tolerating it very well.  Her breathing symptoms have improved. -Patient was a never smoker but had passive exposure.  She worked all her life in [employer].  Denies any asbestos exposure.  No family history.  Patient reports weight loss but unable to quantify it. - PET/CT scan on 05/14/2018 shows hypermetabolic left lower lobe lung mass measuring 3.7 cm, SUV of 11.  Bilateral hilar adenopathy and subcarinal adenopathy.  Both adrenal glands appear thickened and irregular with increased SUV.  Right lower lobe airspace disease has hypermetabolic rim and a hypermetabolic nodule medial to the main airspace disease.  This may represent evolving infection/inflammation. - We will order MRI of the brain for staging purposes. -I have recommended CT-guided biopsy of the left lower lobe lung nodule. - I will see her back after the biopsy to discuss the results and further plan of action.  We will consider sending it to foundation 1 and PDL 1 testing.  2.  Pulmonary embolus: -This is likely secondary to active malignancy. -She is currently on Eliquis 5 mg twice daily.  She is tolerating it very well. - She will likely need indefinite anticoagulation. - She can hold Eliquis the day before biopsy and started back after the biopsy.

## 2018-05-19 NOTE — Progress Notes (Signed)
[facility] NOTE  Patient Care Team: [name], MD as PCP - General (Internal Medicine) Danie Binder, MD as Consulting Physician (Gastroenterology)  CHIEF COMPLAINTS/PURPOSE OF CONSULTATION: Lung mass  HISTORY OF PRESENTING ILLNESS:  Kaitlin [name] 67 y.o. female is here because of of a new lung mass. She went to the ER on Thanksgiving day for increased SOB and right sided rib pain. She was diagnosed with pneumonia, PE, and bilateral lower DVTs. They did a CT of the chest and found a right lung nodule. This was her first time ever having a blood clot. She was placed on Eliquis BID. She had hemoptysis right before hospitalization and during but has not had any since. She has lost weight over the past few weeks but could not quantify it. Denies any nausea, vomiting, or diarrhea. Denies any new pains. Had not noticed any recent bleeding such as epistaxis, hematuria or hematochezia. Denies recent chest pain on exertion, shortness of breath on minimal exertion, pre-syncopal episodes, or palpitations. Denies any numbness or tingling in hands or feet. Denies any recent fevers or infections.  She is a functional adult. She lives at home and her daughter and grandson lives with her. She performs all her own ALDs and activities. She is able to cook, clean, drive, and handle all her own finances. She has no problems with balance, vision or headaches. She is retired now, but previously worked in [employer] and had continuous exposure to  formaldehyde. She denies a smoking history but was exposed to secondhand smoke her entire life at work and at home. She has a negative family history for any cancers. Her mother did have blood clots in her legs.   MEDICAL HISTORY:  Past Medical History:  Diagnosis Date  . Asthma   . Carpal tunnel syndrome   . Cataract    left eye  . Diabetes mellitus without complication ([place])   . DVT (deep venous thrombosis) ([place])   . Hypertension     SURGICAL  HISTORY: Past Surgical History:  Procedure Laterality Date  . ABDOMINAL HYSTERECTOMY    . biopsy of right breast    . FOOT SURGERY    . implant left eye    . INTRAOCULAR LENS INSERTION      SOCIAL HISTORY: Social History   Socioeconomic History  . Marital status: Widowed    Spouse name: Not on file  . Number of children: 4  . Years of education: 70  . Highest education level: High school graduate  Occupational History  . Not on file  Social Needs  . Financial resource strain: Very hard  . Food insecurity:    Worry: Sometimes true    Inability: Sometimes true  . Transportation needs:    Medical: No    Non-medical: No  Tobacco Use  . Smoking status: Never Smoker  . Smokeless tobacco: Never Used  Substance and Sexual Activity  . Alcohol use: Not Currently    Comment: occ  . Drug use: No  . Sexual activity: Yes    Partners: Male    Birth control/protection: Surgical  Lifestyle  . Physical activity:    Days per week: 0 days    Minutes per session: Not on file  . Stress: Only a little  Relationships  . Social connections:    Talks on phone: More than three times a week    Gets together: More than three times a week    Attends religious service: More than 4 times  per year    Active member of club or organization: Yes    Attends meetings of clubs or organizations: More than 4 times per year    Relationship status: Widowed  . Intimate partner violence:    Fear of current or ex partner: No    Emotionally abused: No    Physically abused: No    Forced sexual activity: No  Other Topics Concern  . 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    FAMILY HISTORY: Family History  Problem Relation Age of Onset  . Heart failure Mother   . Stroke Mother   . Heart  attack Mother   . Kidney failure Other   . Leukemia Father     ALLERGIES:  is allergic to banana and aspirin.  MEDICATIONS:  Current Outpatient Medications  Medication Sig Dispense Refill  . albuterol (PROVENTIL HFA;VENTOLIN HFA) 108 (90 BASE) MCG/ACT inhaler Inhale 2 puffs into the lungs every 6 (six) hours as needed for wheezing.    Marland Kitchen albuterol (PROVENTIL) (2.5 MG/3ML) 0.083% nebulizer solution Take 3 mLs (2.5 mg total) by nebulization every 6 (six) hours as needed for wheezing or shortness of breath. 75 mL 2  . amLODipine (NORVASC) 5 MG tablet Take 5 mg by mouth daily.    . benzonatate (TESSALON) 200 MG capsule Take 1 capsule (200 mg total) by mouth 3 (three) times daily as needed for cough. 30 capsule 1  . Blood Glucose Monitoring Suppl (TRUE METRIX METER) DEVI 1 Device by Does not apply route daily. 1 Device 0  . chlorthalidone (HYGROTON) 25 MG tablet Take 25 mg by mouth daily.    . colchicine 0.6 MG tablet Take 1 tablet (0.6 mg total) by mouth daily. 4 tablet 0  . ELIQUIS STARTER PACK (ELIQUIS STARTER PACK) 5 MG TABS Take as directed on package: start with two-5mg  tablets twice daily for 7 days. On day 8, switch to one-5mg  tablet twice daily. 1 each 0  . LORazepam (ATIVAN) 0.5 MG tablet Take 0.5 mg by mouth at bedtime.     Marland Kitchen losartan (COZAAR) 100 MG tablet Take 1 tablet (100 mg total) by mouth daily. 30 tablet 0  . pantoprazole (PROTONIX) 40 MG tablet Take 40 mg by mouth daily.    . Fluticasone-Salmeterol (ADVAIR) 100-50 MCG/DOSE AEPB Inhale 1 puff into the lungs 2 (two) times daily.    . metFORMIN (GLUCOPHAGE) 500 MG tablet Take 500 mg by mouth daily as needed.      No current facility-administered medications for this visit.     REVIEW OF SYSTEMS:   Constitutional: Denies fevers, chills or abnormal night sweats Eyes: Denies blurriness of vision, double vision or watery eyes Ears, nose, mouth, throat, and face: Denies mucositis or sore throat Respiratory: Denies cough, dyspnea  or wheezes Cardiovascular: Denies palpitation, chest discomfort or lower extremity swelling Gastrointestinal:  Denies nausea, heartburn or change in bowel habits Skin: Denies abnormal skin rashes Lymphatics: Denies new lymphadenopathy or easy bruising Neurological:Denies numbness, tingling or new weaknesses Behavioral/Psych: Mood is stable, no new changes  All other systems were reviewed with the patient and are negative.  PHYSICAL EXAMINATION: ECOG PERFORMANCE STATUS: 1 - Symptomatic but completely ambulatory  Vitals:   05/19/18 1257 05/19/18 1321  BP: (!) 147/93  Pulse: (!) 109   Resp: 20   Temp: 98.4 F (36.9 C) 98.4 F (36.9 C)  SpO2: 100%    Filed Weights   05/19/18 1257  Weight: 185 lb 4.8 oz (84.1 kg)    GENERAL:alert, no distress and comfortable SKIN: skin color, texture, turgor are normal, no rashes or significant lesions EYES: normal, conjunctiva are pink and non-injected, sclera clear OROPHARYNX:no exudate, no erythema and lips, buccal mucosa, and tongue normal  NECK: supple, thyroid normal size, non-tender, without nodularity LYMPH:  no palpable lymphadenopathy in the cervical, axillary or inguinal LUNGS: clear to auscultation and percussion with normal breathing effort HEART: regular rate & rhythm and no murmurs and no lower extremity edema ABDOMEN:abdomen soft, non-tender and normal bowel sounds Musculoskeletal:no cyanosis of digits and no clubbing  PSYCH: alert & oriented x 3 with fluent speech NEURO: no focal motor/sensory deficits  LABORATORY DATA:  I have reviewed the data as listed Lab Results  Component Value Date   WBC 13.1 (H) 04/28/2018   HGB 9.0 (L) 04/28/2018   HCT 29.0 (L) 04/28/2018   MCV 83.8 04/28/2018   PLT 520 (H) 04/28/2018     Chemistry      Component Value Date/Time   NA 135 04/29/2018 0216   K 3.9 04/29/2018 0216   CL 97 (L) 04/29/2018 0216   CO2 27 04/29/2018 0216   BUN <5 (L) 04/29/2018 0216   CREATININE 0.83 04/29/2018  0216      Component Value Date/Time   CALCIUM 8.9 04/29/2018 0216   ALKPHOS 188 (H) 04/24/2018 1347   AST 54 (H) 04/24/2018 1347   ALT 44 04/24/2018 1347   BILITOT 1.9 (H) 04/24/2018 1347       RADIOGRAPHIC STUDIES: I have personally reviewed the radiological images as listed and agreed with the findings in the report. Dg Chest 2 View  Result Date: 04/24/2018 CLINICAL DATA:  Productive cough, shortness of breath, RIGHT-side chest pain and back pain for 2 days, history asthma, hypertension EXAM: CHEST - 2 VIEW COMPARISON:  08/05/2008 FINDINGS: Normal heart size and mediastinal contours. Minimal fluid or thickening at the minor fissure. RIGHT lower lobe infiltrate consistent with pneumonia. Remaining lungs clear. No pleural effusion or pneumothorax. IMPRESSION: RIGHT lower lobe pneumonia. Electronically Signed   By: Lavonia Dana M.D.   On: 04/24/2018 15:03   Ct Angio Chest Pe W Or Wo Contrast  Addendum Date: 04/24/2018   ADDENDUM REPORT: 04/24/2018 17:10 ADDENDUM: Elevated RV/LV ratio = 1.42 indicating RIGHT heart strain. Positive for acute PE with CT evidence of right heart strain (RV/LV Ratio = 1.42) consistent with at least submassive (intermediate risk) PE. The presence of right heart strain has been associated with an increased risk of morbidity and mortality. Please activate Code PE by paging [phone]. Electronically Signed   By: Lavonia Dana M.D.   On: 04/24/2018 17:10   Result Date: 04/24/2018 CLINICAL DATA:  Productive cough, shortness of breath and RIGHT-sided pain for 2 days, history of diabetes mellitus, hysterectomy, elevated D-dimer, pulmonary nodule EXAM: CT ANGIOGRAPHY CHEST WITH CONTRAST TECHNIQUE: Multidetector CT imaging of the chest was performed using the standard protocol during bolus administration of intravenous contrast. Multiplanar CT image reconstructions and MIPs were obtained to evaluate the vascular anatomy. CONTRAST:  171mL ISOVUE-370 IOPAMIDOL (ISOVUE-370)  INJECTION 76% IV COMPARISON:  PET/CT 06/06/2006 FINDINGS: Cardiovascular: Aorta normal caliber without aneurysm or dissection. Small pericardial effusion. Small pulmonary embolus within LEFT upper lobe pulmonary artery. Large embolus identified at the proximal descending interlobar pulmonary  artery extending into RIGHT middle and RIGHT lower lobes. No LEFT-sided pulmonary emboli identified. Mediastinum/Nodes: Base of cervical region normal appearance. RIGHT hilar adenopathy with nodes up to 15 mm short axis. Subcarinal adenopathy. Additional borderline enlarged LEFT para-aortic, LEFT paratracheal, and LEFT hilar nodes. Lungs/Pleura: Focal LEFT lower lobe opacity with irregular margins question neoplasm, bilobed, 3.7 x 2.1 x 3.8 cm, abutting the posterior chest wall over a broad surface. Small to moderate-sized loculated RIGHT pleural effusion. Diffuse infiltrate throughout RIGHT lower lobe which could represent pneumonia or pulmonary infarct. Fluid/thickening of the major and minor fissures. No additional pulmonary mass/nodule. Central peribronchial thickening. Upper Abdomen: Thickening of both adrenal glands without discrete mass. Remaining visualized upper abdomen unremarkable. Musculoskeletal: Scattered degenerative disc disease changes thoracic spine. No definite metastatic bone lesions identified. Review of the MIP images confirms the above findings. IMPRESSION: Large pulmonary embolus in descending interlobar pulmonary artery with occlusion of RIGHT lower lobe pulmonary artery and additional smaller clots noted in RIGHT middle and RIGHT upper lobes. Associated RIGHT pleural effusion and atelectasis with diffuse RIGHT lower lobe infiltrate which could represent pneumonia or infarct. LEFT lower lobe mass 3.8 x 3.7 x 2.1 cm associated with hilar and mediastinal adenopathy highly suspicious for a pulmonary neoplasm. Findings discussed with Dr. Sabra Heck on 04/24/2018 at 1655 hours Electronically Signed: By: Lavonia Dana M.D. On: 04/24/2018 17:06   Nm Pet Image Initial (pi) Skull Base To Thigh  Result Date: 05/15/2018 CLINICAL DATA:  Initial treatment strategy for solitary pulmonary nodule. EXAM: NUCLEAR MEDICINE PET SKULL BASE TO THIGH TECHNIQUE: 9.7 mCi F-18 FDG was injected intravenously. Full-ring PET imaging was performed from the skull base to thigh after the radiotracer. CT data was obtained and used for attenuation correction and anatomic localization. Fasting blood glucose: 107 mg/dl COMPARISON:  Chest CT 04/16/2018.  PET-CT 06/06/2006. FINDINGS: Mediastinal blood pool activity: SUV max 2.5 NECK: No hypermetabolic lymph nodes in the neck. Incidental CT findings: none CHEST: 3.7 cm mass posterior left lower lobe is markedly hypermetabolic with SUV max = 11. Airspace disease in the posterior right lower lobe is less diffuse than on the prior study but appears more organized with a surrounding rim and medial 2 cm nodule (35/8). The rim of this more organized disease demonstrates SUV max = 5.5. The medial nodule demonstrates SUV max = 4.7. Right hilar lymphadenopathy is hypermetabolic with SUV max = 6.3. Subcarinal lymphadenopathy is hypermetabolic with SUV max = 6.0. Hypermetabolic activity in the left hilum demonstrates SUV max = 6.0. Incidental CT findings: Similar small pericardial effusion. ABDOMEN/PELVIS: Both adrenal glands appear thickened and irregular. Left adrenal gland is hypermetabolic diffusely with SUV max = 8.9. Right adrenal gland demonstrates SUV max = 5.9. No evidence for hypermetabolic metastatic disease in the liver or spleen. No hypermetabolic abdominal or pelvic lymphadenopathy. Incidental CT findings: There is abdominal aortic atherosclerosis without aneurysm. SKELETON: No focal hypermetabolic activity to suggest skeletal metastasis. Incidental CT findings: none IMPRESSION: 1. Hypermetabolic left lower lobe pulmonary mass compatible with primary bronchogenic neoplasm. 2. The airspace disease  seen previously in the right lower lobe has become less diffuse although is now more organized in the posterior and inferior aspect of the right lower lobe. The disease shows a surrounding hypermetabolic rim in there is a hypermetabolic nodule medial to the main airspace disease. While this may reflect of evolving infection/inflammation, adenocarcinoma could certainly have this appearance. 3. Hypermetabolic mediastinal and bilateral hilar lymphadenopathy consistent with metastatic disease. 4. Diffusely thickened and hypermetabolic adrenal glands. Metastatic  involvement a concern. 5.  Aortic Atherosclerois (ICD10-170.0) Electronically Signed   By: Misty Stanley M.D.   On: 05/15/2018 08:40   Vas [place] Lower Extremity Venous (dvt)  Result Date: 04/25/2018  Lower Venous Study Indications: Pulmonary embolism, and SOB.  Limitations: Body habitus. Performing Technologist: Lorina Rabon  Examination Guidelines: A complete evaluation includes B-mode imaging, spectral Doppler, color Doppler, and power Doppler as needed of all accessible portions of each vessel. Bilateral testing is considered an integral part of a complete examination. Limited examinations for reoccurring indications may be performed as noted.  Right Venous Findings: +---------+---------------+---------+-----------+----------+-----------------+          CompressibilityPhasicitySpontaneityPropertiesSummary           +---------+---------------+---------+-----------+----------+-----------------+ CFV      Full           Yes      Yes                                    +---------+---------------+---------+-----------+----------+-----------------+ SFJ      Full                                                           +---------+---------------+---------+-----------+----------+-----------------+ FV Prox  Full                                                            +---------+---------------+---------+-----------+----------+-----------------+ FV Mid   Full                                                           +---------+---------------+---------+-----------+----------+-----------------+ FV DistalFull                                                           +---------+---------------+---------+-----------+----------+-----------------+ PFV      Full                                                           +---------+---------------+---------+-----------+----------+-----------------+ POP      Full           Yes      Yes                                    +---------+---------------+---------+-----------+----------+-----------------+ PTV      Partial                 No  Age Indeterminate +---------+---------------+---------+-----------+----------+-----------------+ PERO     Partial                 No                   Age Indeterminate +---------+---------------+---------+-----------+----------+-----------------+  Left Venous Findings: +---------+---------------+---------+-----------+----------+-----------------+          CompressibilityPhasicitySpontaneityPropertiesSummary           +---------+---------------+---------+-----------+----------+-----------------+ CFV      Full           Yes      Yes                                    +---------+---------------+---------+-----------+----------+-----------------+ SFJ      Full                                                           +---------+---------------+---------+-----------+----------+-----------------+ FV Prox  Full                                                           +---------+---------------+---------+-----------+----------+-----------------+ FV Mid   Full                                                           +---------+---------------+---------+-----------+----------+-----------------+ FV DistalFull                                                            +---------+---------------+---------+-----------+----------+-----------------+ PFV      Full                                                           +---------+---------------+---------+-----------+----------+-----------------+ POP      Full           Yes      Yes                                    +---------+---------------+---------+-----------+----------+-----------------+ PTV      Partial                 No                   Age Indeterminate +---------+---------------+---------+-----------+----------+-----------------+ PERO     Partial                 No  Age Indeterminate +---------+---------------+---------+-----------+----------+-----------------+    Summary: Right: Findings consistent with age indeterminate deep vein thrombosis involving the right posterior tibial vein, and right peroneal vein. No cystic structure found in the popliteal fossa. Left: Findings consistent with age indeterminate deep vein thrombosis involving the left posterior tibial vein, and left peroneal vein. No cystic structure found in the popliteal fossa.  *See table(s) above for measurements and observations. Electronically signed by Servando Snare MD on 04/25/2018 at 3:36:07 PM.    Final     ASSESSMENT & PLAN:  Lung mass 1.  Left lung mass: - Presented to the ER on 04/24/2018 with shortness of breath. -CT PE protocol showed large pulmonary embolus in the descending interlobar pulmonary artery with occlusion of the right lower lobe pulmonary artery and additional small clots noted in the right middle lobe and right upper lobes with associated right pleural effusion and right lower lobe infarct.  It also showed incidental left lower lobe mass measuring 3.8 x 3.7 x 2.1 cm associated with hilar and mediastinal adenopathy. - She was started on anticoagulation and was discharged home on 04/29/2018 on Eliquis.  She is tolerating it very well.   Her breathing symptoms have improved. -Patient was a never smoker but had passive exposure.  She worked all her life in [employer].  Denies any asbestos exposure.  No family history.  Patient reports weight loss but unable to quantify it. - PET/CT scan on 05/14/2018 shows hypermetabolic left lower lobe lung mass measuring 3.7 cm, SUV of 11.  Bilateral hilar adenopathy and subcarinal adenopathy.  Both adrenal glands appear thickened and irregular with increased SUV.  Right lower lobe airspace disease has hypermetabolic rim and a hypermetabolic nodule medial to the main airspace disease.  This may represent evolving infection/inflammation. - We will order MRI of the brain for staging purposes. -I have recommended CT-guided biopsy of the left lower lobe lung nodule. - I will see her back after the biopsy to discuss the results and further plan of action.  We will consider sending it to foundation 1 and PDL 1 testing.  2.  Pulmonary embolus: -This is likely secondary to active malignancy. -She is currently on Eliquis 5 mg twice daily.  She is tolerating it very well. - She will likely need indefinite anticoagulation. - She can hold Eliquis the day before biopsy and started back after the biopsy.  Orders Placed This Encounter  Procedures  . MR Brain W Wo Contrast    Standing Status:   Future    Standing Expiration Date:   05/19/2019    Order Specific Question:   ** REASON FOR EXAM (FREE TEXT)    Answer:   lung nodule    Order Specific Question:   If indicated for the ordered procedure, I authorize the administration of contrast media per Radiology protocol    Answer:   Yes    Order Specific Question:   What is the patient's sedation requirement?    Answer:   No Sedation    Order Specific Question:   Does the patient have a pacemaker or implanted devices?    Answer:   No    Order Specific Question:   Use SRS Protocol?    Answer:   Yes    Order Specific Question:   Radiology Contrast Protocol - do  NOT remove file path    Answer:   [email or web address]    Order Specific Question:   Preferred imaging location?    Answer:   [facility] (  table limit-350lbs)    All questions were answered. The patient knows to call the clinic with any problems, questions or concerns. Total time spent is 60 minutes with more than 50% of the time spent face-to-face discussing scan findings, possible diagnosis, further work-up and coordination of care.     Derek Jack, MD 05/19/2018 4:04 PM

## 2018-05-19 NOTE — Patient Instructions (Signed)
[place] at [facility] Discharge Instructions  Follow up 10 days after biopsy.   Thank you for choosing [place] at [facility] to provide your oncology and hematology care.  To afford each patient quality time with our provider, please arrive at least 15 minutes before your scheduled appointment time.   If you have a lab appointment with the [place] please come in thru the  Main Entrance and check in at the main information desk  You need to re-schedule your appointment should you arrive 10 or more minutes late.  We strive to give you quality time with our providers, and arriving late affects you and other patients whose appointments are after yours.  Also, if you no show three or more times for appointments you may be dismissed from the clinic at the providers discretion.     Again, thank you for choosing [facility].  Our hope is that these requests will decrease the amount of time that you wait before being seen by our physicians.       _____________________________________________________________  Should you have questions after your visit to [facility], please contact our office at [phone] between the hours of 8:00 a.m. and 4:30 p.m.  Voicemails left after 4:00 p.m. will not be returned until the following business day.  For prescription refill requests, have your pharmacy contact our office and allow 72 hours.    Cancer Center Support Programs:   > Cancer Support Group  2nd Tuesday of the month 1pm-2pm, Journey Room

## 2018-05-27 ENCOUNTER — Ambulatory Visit (INDEPENDENT_AMBULATORY_CARE_PROVIDER_SITE_OTHER): Payer: Medicare HMO | Admitting: Pulmonary Disease

## 2018-05-27 ENCOUNTER — Encounter: Payer: Self-pay | Admitting: Pulmonary Disease

## 2018-05-27 VITALS — BP 126/88 | HR 88 | Ht 66.0 in | Wt 182.0 lb

## 2018-05-27 DIAGNOSIS — R059 Cough, unspecified: Secondary | ICD-10-CM

## 2018-05-27 DIAGNOSIS — R05 Cough: Secondary | ICD-10-CM

## 2018-05-27 MED ORDER — BENZONATATE 200 MG PO CAPS: 200.0000 mg | ORAL_CAPSULE | Freq: Three times a day (TID) | ORAL | 1 refills | Status: DC | PRN

## 2018-05-27 MED ORDER — APIXABAN 5 MG PO TABS: 5.0000 mg | ORAL_TABLET | Freq: Two times a day (BID) | ORAL | 3 refills | Status: DC

## 2018-05-27 NOTE — Progress Notes (Signed)
@Patient  ID: Kaitlin [name], female    DOB: 1950/10/21, 67 y.o.   MRN: [id number]  CC: Follow-up for pulmonary embolism, lung mass  Referring provider: [name], MD  HPI: 67 year old female, never smoked. PMH significant for asthma]. Recent hospital admission from 11/28-12/3 for new PE and left lower lung mass. Did have a recent PET scan revealing hypermetabolic mass, hypoglycemic metabolic adenopathy and activity within the adrenals Feels generally well at present  Hospital course: Patient originally presented with cough and worsening dyspnea for 3 days Associated tachycardia . Hemoptysis observed in ED. CTA of chest showed large right sided PE with possible RLL infarct vs infiltrate as well as left lower lobe 3.8 x 3.7 x 2.1 cm mass with hilar and mediastinal adenopathy. No history of malignancy. Initally admitted to ICU where she was started on IV heparin and received supportive treatment with oxygen. Treated with azithromycin course. Weaned to room air and was transitioned to Eliquis. Plan is for patient to be on anticoagulation for 3 week and then needs CT guided biopsy of left lower lung mass.   05/27/2018 Patient presents today for hospital follow-up. Accompanied by family friend/care taker. Breathing better since hospitalization.  Breathing continues to improve Still has occasional cough No fever, occasional chills  Feels relatively well  Questions regarding biopsy discussed.    Allergies  Allergen Reactions  . Banana Anaphylaxis  . Aspirin Other (See Comments)    G.I. Upset      There is no immunization history on file for this patient.  Past Medical History:  Diagnosis Date  . Asthma   . Carpal tunnel syndrome   . Cataract    left eye  . Diabetes mellitus without complication (HCC)   . DVT (deep venous thrombosis) (HCC)   . Hypertension     Tobacco History: Social History   Tobacco Use  Smoking Status Never Smoker  Smokeless Tobacco Never Used    Counseling given: Not Answered   Outpatient Medications Prior to Visit  Medication Sig Dispense Refill  . albuterol (PROVENTIL HFA;VENTOLIN HFA) 108 (90 BASE) MCG/ACT inhaler Inhale 2 puffs into the lungs every 6 (six) hours as needed for wheezing.    Marland Kitchen albuterol (PROVENTIL) (2.5 MG/3ML) 0.083% nebulizer solution Take 3 mLs (2.5 mg total) by nebulization every 6 (six) hours as needed for wheezing or shortness of breath. 75 mL 2  . amLODipine (NORVASC) 5 MG tablet Take 5 mg by mouth daily.    . benzonatate (TESSALON) 200 MG capsule Take 1 capsule (200 mg total) by mouth 3 (three) times daily as needed for cough. 30 capsule 1  . Blood Glucose Monitoring Suppl (TRUE METRIX METER) DEVI 1 Device by Does not apply route daily. 1 Device 0  . chlorthalidone (HYGROTON) 25 MG tablet Take 25 mg by mouth daily.    . colchicine 0.6 MG tablet Take 1 tablet (0.6 mg total) by mouth daily. 4 tablet 0  . ELIQUIS STARTER PACK (ELIQUIS STARTER PACK) 5 MG TABS Take as directed on package: start with two-5mg  tablets twice daily for 7 days. On day 8, switch to one-5mg  tablet twice daily. 1 each 0  . Fluticasone-Salmeterol (ADVAIR) 100-50 MCG/DOSE AEPB Inhale 1 puff into the lungs 2 (two) times daily.    Marland Kitchen LORazepam (ATIVAN) 0.5 MG tablet Take 0.5 mg by mouth at bedtime.     Marland Kitchen losartan (COZAAR) 100 MG tablet Take 1 tablet (100 mg total) by mouth daily. 30 tablet 0  . metFORMIN (GLUCOPHAGE)  500 MG tablet Take 500 mg by mouth daily as needed.     . pantoprazole (PROTONIX) 40 MG tablet Take 40 mg by mouth daily.     No facility-administered medications prior to visit.     Review of Systems  Review of Systems  Constitutional: Negative for fever and unexpected weight change.  Eyes: Negative.   Respiratory: Positive for cough. Negative for shortness of breath and wheezing.   Cardiovascular: Negative.   Gastrointestinal: Negative for abdominal pain, blood in stool, nausea and vomiting.  Endocrine: Negative.      Physical Exam  BP 126/88 (BP Location: Left Arm, Patient Position: Sitting, Cuff Size: Normal)   Pulse 88   Ht 5\' 6"  (1.676 m)   Wt 182 lb (82.6 kg)   SpO2 99%   BMI 29.38 kg/m  Physical Exam Constitutional:      General: She is not in acute distress.    Appearance: Normal appearance. She is well-developed.     Comments: Appears chronically ill   HENT:     Head: Normocephalic and atraumatic.     Nose: No congestion.  Eyes:     Extraocular Movements: Extraocular movements intact.     Pupils: Pupils are equal, round, and reactive to light.  Neck:     Musculoskeletal: Normal range of motion. No neck rigidity or muscular tenderness.  Cardiovascular:     Rate and Rhythm: Normal rate and regular rhythm.  Pulmonary:     Effort: Pulmonary effort is normal. No respiratory distress.     Breath sounds: Normal breath sounds. No wheezing or rales.  Abdominal:     Palpations: Abdomen is soft.  Neurological:     Mental Status: She is alert.      Lab Results:  CBC    Component Value Date/Time   WBC 13.1 (H) 04/28/2018 1331   RBC 3.46 (L) 04/28/2018 1331   HGB 9.0 (L) 04/28/2018 1331   HCT 29.0 (L) 04/28/2018 1331   PLT 520 (H) 04/28/2018 1331   MCV 83.8 04/28/2018 1331   MCH 26.0 04/28/2018 1331   MCHC 31.0 04/28/2018 1331   RDW 13.9 04/28/2018 1331   LYMPHSABS 0.9 04/24/2018 1347   MONOABS 1.9 (H) 04/24/2018 1347   EOSABS 0.0 04/24/2018 1347   BASOSABS 0.0 04/24/2018 1347    BMET    Component Value Date/Time   NA 135 04/29/2018 0216   K 3.9 04/29/2018 0216   CL 97 (L) 04/29/2018 0216   CO2 27 04/29/2018 0216   GLUCOSE 114 (H) 04/29/2018 0216   BUN <5 (L) 04/29/2018 0216   CREATININE 0.83 04/29/2018 0216   CALCIUM 8.9 04/29/2018 0216   GFRNONAA >60 04/29/2018 0216   GFRAA >60 04/29/2018 0216    Imaging: Nm Pet Image Initial (pi) Skull Base To Thigh  Result Date: 05/15/2018 CLINICAL DATA:  Initial treatment strategy for solitary pulmonary nodule. EXAM:  NUCLEAR MEDICINE PET SKULL BASE TO THIGH TECHNIQUE: 9.7 mCi F-18 FDG was injected intravenously. Full-ring PET imaging was performed from the skull base to thigh after the radiotracer. CT data was obtained and used for attenuation correction and anatomic localization. Fasting blood glucose: 107 mg/dl COMPARISON:  Chest CT 16/02/9603.  PET-CT 06/06/2006. FINDINGS: Mediastinal blood pool activity: SUV max 2.5 NECK: No hypermetabolic lymph nodes in the neck. Incidental CT findings: none CHEST: 3.7 cm mass posterior left lower lobe is markedly hypermetabolic with SUV max = 11. Airspace disease in the posterior right lower lobe is less diffuse than on the prior  study but appears more organized with a surrounding rim and medial 2 cm nodule (35/8). The rim of this more organized disease demonstrates SUV max = 5.5. The medial nodule demonstrates SUV max = 4.7. Right hilar lymphadenopathy is hypermetabolic with SUV max = 6.3. Subcarinal lymphadenopathy is hypermetabolic with SUV max = 6.0. Hypermetabolic activity in the left hilum demonstrates SUV max = 6.0. Incidental CT findings: Similar small pericardial effusion. ABDOMEN/PELVIS: Both adrenal glands appear thickened and irregular. Left adrenal gland is hypermetabolic diffusely with SUV max = 8.9. Right adrenal gland demonstrates SUV max = 5.9. No evidence for hypermetabolic metastatic disease in the liver or spleen. No hypermetabolic abdominal or pelvic lymphadenopathy. Incidental CT findings: There is abdominal aortic atherosclerosis without aneurysm. SKELETON: No focal hypermetabolic activity to suggest skeletal metastasis. Incidental CT findings: none IMPRESSION: 1. Hypermetabolic left lower lobe pulmonary mass compatible with primary bronchogenic neoplasm. 2. The airspace disease seen previously in the right lower lobe has become less diffuse although is now more organized in the posterior and inferior aspect of the right lower lobe. The disease shows a surrounding  hypermetabolic rim in there is a hypermetabolic nodule medial to the main airspace disease. While this may reflect of evolving infection/inflammation, adenocarcinoma could certainly have this appearance. 3. Hypermetabolic mediastinal and bilateral hilar lymphadenopathy consistent with metastatic disease. 4. Diffusely thickened and hypermetabolic adrenal glands. Metastatic involvement a concern. 5.  Aortic Atherosclerois (ICD10-170.0) Electronically Signed   By: Kennith Center M.D.   On: 05/15/2018 08:40     Assessment & Plan:  68 year old female, never smoked. No significant past medical history prior to recent hospitalization.   Pulmonary embolism -On Eliquis  Multilobar pneumonia -Resolving infiltrative process  Likely metastatic lung cancer -CT-guided biopsy scheduled for 06/06/2018   We will continue Eliquis Medications to help with cough Encouraged to stay active  We will follow-up with patient following a CT guided biopsy  Encouraged to call with any significant concerns   Tomma Lightning, MD 05/27/2018

## 2018-05-27 NOTE — Patient Instructions (Signed)
Pulmonary embolism on Eliquis Lung mass -Scheduled for CT-guided biopsy on 06/06/2017  Refilled Eliquis and benzonatate We will see you back in the office in about 2 months Call with any significant concerns

## 2018-05-29 DIAGNOSIS — I2609 Other pulmonary embolism with acute cor pulmonale: Secondary | ICD-10-CM | POA: Diagnosis not present

## 2018-05-30 ENCOUNTER — Ambulatory Visit (HOSPITAL_COMMUNITY)
Admission: RE | Admit: 2018-05-30 | Discharge: 2018-05-30 | Disposition: A | Discharge status: Home / Self Care | Payer: Medicare HMO | Source: Ambulatory Visit | Attending: Nurse Practitioner | Admitting: Nurse Practitioner

## 2018-05-30 DIAGNOSIS — C3490 Malignant neoplasm of unspecified part of unspecified bronchus or lung: Secondary | ICD-10-CM | POA: Diagnosis not present

## 2018-05-30 DIAGNOSIS — R918 Other nonspecific abnormal finding of lung field: Secondary | ICD-10-CM | POA: Insufficient documentation

## 2018-05-30 MED ORDER — GADOBUTROL 1 MMOL/ML IV SOLN
7.5000 mL | Freq: Once | INTRAVENOUS | Status: AC | PRN
  Administered 2018-05-30: 7.5 mL via INTRAVENOUS

## 2018-06-02 ENCOUNTER — Ambulatory Visit: Payer: Medicare HMO | Admitting: Pulmonary Disease

## 2018-06-02 DIAGNOSIS — I2699 Other pulmonary embolism without acute cor pulmonale: Secondary | ICD-10-CM | POA: Diagnosis not present

## 2018-06-04 ENCOUNTER — Other Ambulatory Visit: Payer: Self-pay | Admitting: Radiology

## 2018-06-05 ENCOUNTER — Other Ambulatory Visit: Payer: Self-pay | Admitting: Radiology

## 2018-06-06 ENCOUNTER — Encounter (HOSPITAL_COMMUNITY): Payer: Self-pay

## 2018-06-06 ENCOUNTER — Telehealth: Payer: Self-pay | Admitting: Pulmonary Disease

## 2018-06-06 ENCOUNTER — Ambulatory Visit (HOSPITAL_COMMUNITY)
Admission: RE | Admit: 2018-06-06 | Discharge: 2018-06-06 | Disposition: A | Discharge status: Home / Self Care | Payer: Medicare HMO | Source: Ambulatory Visit | Attending: Primary Care | Admitting: Primary Care

## 2018-06-06 DIAGNOSIS — R911 Solitary pulmonary nodule: Secondary | ICD-10-CM | POA: Insufficient documentation

## 2018-06-06 DIAGNOSIS — R918 Other nonspecific abnormal finding of lung field: Secondary | ICD-10-CM

## 2018-06-06 NOTE — Telephone Encounter (Signed)
Yes I spoke to Dr. Ander Slade on the phone and I dont think he would have a problem with doing but I guess if he is not here, he asked for a NP to admit her.

## 2018-06-06 NOTE — Telephone Encounter (Addendum)
I ordered the CT biopsy again so it could be in the work que again. I called admitting and they said we should start with bed control at [phone]. I am going to try to get the CT biopsy scheduled first.   The CT biopsy is scheduled for 06/18/2018 at [facility]. Her bed is scheduled for 06/16/2018 at [facility] called pt to let her know, but it went to her voicemail.  I called to get a bed for patient for 06/16/18 but she informed that we would have to call on the day to do a direct admit to the hospitalist. If she is admitted under our service then I have to change it to the physician who is rounding that day (Dr. Loanne Drilling). Dr. Ander Slade is not in the hospital that day. I kept the appt because Dr. Ander Slade is here on Monday and we can discuss these options and figure out if we should direct admit  her under our service.  Will route to Dr. Ander Slade

## 2018-06-06 NOTE — Telephone Encounter (Signed)
Type Date User Summary Attachment  General 05/05/2018 3:49 PM Vella Kohler D - -  Note   Gave to [facility] to precert.         Type Date User Summary Attachment  General 05/05/2018 3:43 PM Vella Kohler D - -  Note   Per Derl Barrow, will wait for PET results to determine whether pt will need to be pre-admitted for stopping anticoagulation 2-3 days prior to CT Biopsy.  Please do not schedule prior to 05/19/18.          Type Date User Summary Attachment  Provider Comments 05/05/2018 2:42 PM Karmen Stabs, RN Provider Comments -  Note   Please schedule after December 23rd.  Needs to be on Eliquis for 3 weeks.          These are not usually scheduled by PCCs.  The hospitals schedule this straight out of the work que.  Please see Beth's notes regarding the Eliquis I copied in chart.  I assume a new order will need to be placed since it looks like they checked the patient in this morning.

## 2018-06-06 NOTE — Telephone Encounter (Signed)
Spoke with pt in the lobby, she showed up for her CT biopsy today but she thought she would be admitted today for 2 days to stop her Eliquis. She was afraid to stop the Eliquis for 2 days because of the risks. We have to reschedule CT biopsy and then schedule admission 2 days prior.   Holly-can we reschedule CT biopsy?  Beth-can you help with scheduling the admission after the biopsy is rescheduled.?

## 2018-06-06 NOTE — Telephone Encounter (Signed)
Spoke with pt, and advised her of the appt and advised her that I would call her back when I find out more information about the admission. Pt agreed and will await our call.

## 2018-06-06 NOTE — Telephone Encounter (Signed)
Will await Kaitlin [name]'s response.

## 2018-06-06 NOTE — Telephone Encounter (Signed)
I can help in any way but I thought this was going to be addressed at her last visit with Dr. Ander Slade, he would have to be the admitting physician.

## 2018-06-10 NOTE — Telephone Encounter (Signed)
Spoke with Dr. Ander Slade and they are going to call on that day to be admitted.

## 2018-06-11 NOTE — Telephone Encounter (Signed)
Pt is calling back [phone]

## 2018-06-11 NOTE — Telephone Encounter (Signed)
Called and spoke with Patient.  Patient stated that she was returning a call about her hospital admission and Eliquis.  Instructed Patient that she would be admitted to [facility] 06/16/18 for her CT biopsy 06/18/18.  Patient ask if she should take her Eliquis the morning of 06/16/18?  Advised Patient that she had to be off Eliquis for 2 days per EW note- will need to be pre-admitted for stopping anticoagulation 2-3 days, that is why she is being admitted 2 days prior to Ct biopsy. Patient stated understanding.  Explained that someone will contact her if we have more information, and she can call if she has further questions.  Advised Patient that if she has not heard anything prior to 06/16/18, to call [facility] admitting to check on bed status.  Patient stated understanding.

## 2018-06-16 ENCOUNTER — Telehealth: Payer: Self-pay | Admitting: Pulmonary Disease

## 2018-06-16 ENCOUNTER — Inpatient Hospital Stay (HOSPITAL_COMMUNITY)
Admission: AD | Admit: 2018-06-16 | Discharge: 2018-06-18 | DRG: 204 | Disposition: A | Discharge status: Home / Self Care | Payer: Medicare HMO | Attending: Internal Medicine | Admitting: Internal Medicine

## 2018-06-16 DIAGNOSIS — Z806 Family history of leukemia: Secondary | ICD-10-CM | POA: Diagnosis not present

## 2018-06-16 DIAGNOSIS — Z90710 Acquired absence of both cervix and uterus: Secondary | ICD-10-CM

## 2018-06-16 DIAGNOSIS — E119 Type 2 diabetes mellitus without complications: Secondary | ICD-10-CM | POA: Diagnosis not present

## 2018-06-16 DIAGNOSIS — J45909 Unspecified asthma, uncomplicated: Secondary | ICD-10-CM | POA: Diagnosis not present

## 2018-06-16 DIAGNOSIS — Z79899 Other long term (current) drug therapy: Secondary | ICD-10-CM | POA: Diagnosis not present

## 2018-06-16 DIAGNOSIS — C3490 Malignant neoplasm of unspecified part of unspecified bronchus or lung: Secondary | ICD-10-CM | POA: Diagnosis not present

## 2018-06-16 DIAGNOSIS — Z8249 Family history of ischemic heart disease and other diseases of the circulatory system: Secondary | ICD-10-CM

## 2018-06-16 DIAGNOSIS — I2699 Other pulmonary embolism without acute cor pulmonale: Secondary | ICD-10-CM | POA: Diagnosis not present

## 2018-06-16 DIAGNOSIS — I700 Atherosclerosis of aorta: Secondary | ICD-10-CM | POA: Diagnosis present

## 2018-06-16 DIAGNOSIS — Z961 Presence of intraocular lens: Secondary | ICD-10-CM | POA: Diagnosis present

## 2018-06-16 DIAGNOSIS — Z9842 Cataract extraction status, left eye: Secondary | ICD-10-CM

## 2018-06-16 DIAGNOSIS — G5600 Carpal tunnel syndrome, unspecified upper limb: Secondary | ICD-10-CM | POA: Diagnosis present

## 2018-06-16 DIAGNOSIS — M109 Gout, unspecified: Secondary | ICD-10-CM | POA: Diagnosis not present

## 2018-06-16 DIAGNOSIS — Z841 Family history of disorders of kidney and ureter: Secondary | ICD-10-CM | POA: Diagnosis not present

## 2018-06-16 DIAGNOSIS — Z823 Family history of stroke: Secondary | ICD-10-CM | POA: Diagnosis not present

## 2018-06-16 DIAGNOSIS — E876 Hypokalemia: Secondary | ICD-10-CM | POA: Diagnosis present

## 2018-06-16 DIAGNOSIS — Z886 Allergy status to analgesic agent status: Secondary | ICD-10-CM

## 2018-06-16 DIAGNOSIS — J90 Pleural effusion, not elsewhere classified: Secondary | ICD-10-CM | POA: Diagnosis not present

## 2018-06-16 DIAGNOSIS — Z91018 Allergy to other foods: Secondary | ICD-10-CM | POA: Diagnosis not present

## 2018-06-16 DIAGNOSIS — I82409 Acute embolism and thrombosis of unspecified deep veins of unspecified lower extremity: Secondary | ICD-10-CM | POA: Diagnosis present

## 2018-06-16 DIAGNOSIS — R918 Other nonspecific abnormal finding of lung field: Secondary | ICD-10-CM | POA: Diagnosis not present

## 2018-06-16 DIAGNOSIS — I824Y9 Acute embolism and thrombosis of unspecified deep veins of unspecified proximal lower extremity: Secondary | ICD-10-CM | POA: Diagnosis not present

## 2018-06-16 DIAGNOSIS — Z9889 Other specified postprocedural states: Secondary | ICD-10-CM

## 2018-06-16 DIAGNOSIS — C3432 Malignant neoplasm of lower lobe, left bronchus or lung: Secondary | ICD-10-CM | POA: Diagnosis not present

## 2018-06-16 DIAGNOSIS — Z7901 Long term (current) use of anticoagulants: Secondary | ICD-10-CM

## 2018-06-16 DIAGNOSIS — I10 Essential (primary) hypertension: Secondary | ICD-10-CM | POA: Diagnosis not present

## 2018-06-16 HISTORY — DX: Other pulmonary embolism without acute cor pulmonale: I26.99

## 2018-06-16 LAB — COMPREHENSIVE METABOLIC PANEL
ALT: 14 U/L (ref 0–44)
AST: 19 U/L (ref 15–41)
Albumin: 3.9 g/dL (ref 3.5–5.0)
Alkaline Phosphatase: 100 U/L (ref 38–126)
Anion gap: 11 (ref 5–15)
BILIRUBIN TOTAL: 0.5 mg/dL (ref 0.3–1.2)
BUN: 8 mg/dL (ref 8–23)
CO2: 26 mmol/L (ref 22–32)
Calcium: 9.9 mg/dL (ref 8.9–10.3)
Chloride: 99 mmol/L (ref 98–111)
Creatinine, Ser: 0.84 mg/dL (ref 0.44–1.00)
GFR calc Af Amer: >60 mL/min (ref >60)
GFR calc non Af Amer: >60 mL/min (ref >60)
Glucose, Bld: 85 mg/dL (ref 70–99)
Potassium: 2.9 mmol/L — ABNORMAL LOW (ref 3.5–5.1)
Sodium: 136 mmol/L (ref 135–145)
Total Protein: 7.9 g/dL (ref 6.5–8.1)

## 2018-06-16 LAB — CBC WITH DIFFERENTIAL/PLATELET
Abs Immature Granulocytes: 0.02 10*3/uL (ref 0.00–0.07)
Basophils Absolute: 0.1 10*3/uL (ref 0.0–0.1)
Basophils Relative: 1 %
Eosinophils Absolute: 0.2 10*3/uL (ref 0.0–0.5)
Eosinophils Relative: 4 %
HCT: 39.1 % (ref 36.0–46.0)
Hemoglobin: 12.9 g/dL (ref 12.0–15.0)
Immature Granulocytes: 0 %
Lymphocytes Relative: 25 %
Lymphs Abs: 1.3 10*3/uL (ref 0.7–4.0)
MCH: 26.7 pg (ref 26.0–34.0)
MCHC: 33 g/dL (ref 30.0–36.0)
MCV: 80.8 fL (ref 80.0–100.0)
MONO ABS: 0.6 10*3/uL (ref 0.1–1.0)
Monocytes Relative: 12 %
NEUTROS PCT: 58 %
Neutro Abs: 3 10*3/uL (ref 1.7–7.7)
Platelets: 306 10*3/uL (ref 150–400)
RBC: 4.84 MIL/uL (ref 3.87–5.11)
RDW: 14.6 % (ref 11.5–15.5)
WBC: 5.2 10*3/uL (ref 4.0–10.5)
nRBC: 0 % (ref 0.0–0.2)

## 2018-06-16 LAB — APTT: aPTT: 29 seconds (ref 24–36)

## 2018-06-16 LAB — GLUCOSE, CAPILLARY
Glucose-Capillary: 140 mg/dL — ABNORMAL HIGH (ref 70–99)
Glucose-Capillary: 91 mg/dL (ref 70–99)

## 2018-06-16 LAB — HEPARIN LEVEL (UNFRACTIONATED): Heparin Unfractionated: 1.94 IU/mL — ABNORMAL HIGH (ref 0.30–0.70)

## 2018-06-16 LAB — HEMOGLOBIN A1C
Hgb A1c MFr Bld: 6.1 % — ABNORMAL HIGH (ref 4.8–5.6)
Mean Plasma Glucose: 128.37 mg/dL

## 2018-06-16 MED ORDER — CHLORTHALIDONE 25 MG PO TABS
25.0000 mg | ORAL_TABLET | Freq: Every day | ORAL | Status: DC
  Administered 2018-06-17: 25 mg via ORAL
  Filled 2018-06-16 (×3): qty 1

## 2018-06-16 MED ORDER — MOMETASONE FURO-FORMOTEROL FUM 100-5 MCG/ACT IN AERO
2.0000 | INHALATION_SPRAY | Freq: Two times a day (BID) | RESPIRATORY_TRACT | Status: DC
  Administered 2018-06-16 – 2018-06-18 (×4): 2 via RESPIRATORY_TRACT
  Filled 2018-06-16: qty 8.8

## 2018-06-16 MED ORDER — INSULIN ASPART 100 UNIT/ML ~~LOC~~ SOLN
0.0000 [IU] | Freq: Three times a day (TID) | SUBCUTANEOUS | Status: DC
  Administered 2018-06-16: 1 [IU] via SUBCUTANEOUS

## 2018-06-16 MED ORDER — ALBUTEROL SULFATE (2.5 MG/3ML) 0.083% IN NEBU: 2.5000 mg | INHALATION_SOLUTION | Freq: Four times a day (QID) | RESPIRATORY_TRACT | Status: DC | PRN

## 2018-06-16 MED ORDER — PANTOPRAZOLE SODIUM 40 MG PO TBEC
40.0000 mg | DELAYED_RELEASE_TABLET | Freq: Every day | ORAL | Status: DC
  Administered 2018-06-17 – 2018-06-18 (×2): 40 mg via ORAL
  Filled 2018-06-16 (×2): qty 1

## 2018-06-16 MED ORDER — BENZONATATE 100 MG PO CAPS: 200.0000 mg | ORAL_CAPSULE | Freq: Three times a day (TID) | ORAL | Status: DC | PRN

## 2018-06-16 MED ORDER — POTASSIUM CHLORIDE 10 MEQ/100ML IV SOLN
10.0000 meq | INTRAVENOUS | Status: AC
  Administered 2018-06-16 – 2018-06-17 (×4): 10 meq via INTRAVENOUS
  Filled 2018-06-16 (×4): qty 100

## 2018-06-16 MED ORDER — HEPARIN (PORCINE) 25000 UT/250ML-% IV SOLN
1200.0000 [IU]/h | INTRAVENOUS | Status: DC
  Administered 2018-06-16 – 2018-06-17 (×2): 1200 [IU]/h via INTRAVENOUS
  Filled 2018-06-16 (×2): qty 250

## 2018-06-16 MED ORDER — LORAZEPAM 0.5 MG PO TABS
0.5000 mg | ORAL_TABLET | Freq: Every day | ORAL | Status: DC
  Administered 2018-06-16 – 2018-06-17 (×2): 0.5 mg via ORAL
  Filled 2018-06-16 (×2): qty 1

## 2018-06-16 NOTE — H&P (Addendum)
History and Physical  Kaitlin [name] ZOX:[id number] DOB: 05/04/1951 DOA: 06/16/2018  Referring physician: DR Virl Diamond PCP: [name], MD  Outpatient Specialists: Pulmonary Patient coming from: [facility] & is able to ambulate   Chief Complaint: Lung mass  HPI: Kaitlin [name] is a 68 y.o. female with medical history significant for diet-controlled diabetes.  Patient was on metformin but states she has not had to take it for well, hypertension, asthma, insomnia/anxiety on lorazepam, history of DVT/pulmonary embolism on long-term anticoagulation with Eliquis 5 mg twice a day, cataract, carpal tunnel syndrome presented for admission at the request of the pulmonologist Dr. Jama Flavors A for left basal lung mass that needs CT-guided biopsy.  Since patient is on Eliquis she was not comfortable holding her Eliquis due to previous history of pulmonary embolism and DVT.  She requested inpatient admission to be transitioned from oral Eliquis to IV heparin in preparation for the biopsy.  Patient denies chest pain shortness of breath but she says she has had nausea  ED Course: None  Review of Systems: Patient seen bedside in room  5 w14. Pt complains of nausea  Pt denies any abdominal pain diarrhea shortness of breath cough chest pain.  Review of systems are otherwise negative   Past Medical History:  Diagnosis Date  . Asthma   . Carpal tunnel syndrome   . Cataract    left eye  . Diabetes mellitus without complication (HCC)   . DVT (deep venous thrombosis) (HCC)   . Hypertension    Past Surgical History:  Procedure Laterality Date  . ABDOMINAL HYSTERECTOMY    . biopsy of right breast    . FOOT SURGERY    . implant left eye    . INTRAOCULAR LENS INSERTION      Social History:  reports that she has never smoked. She has never used smokeless tobacco. She reports previous alcohol use. She reports that she does not use drugs.   Allergies  Allergen Reactions  . Banana Anaphylaxis  . Aspirin Other  (See Comments)    G.IDorena Dew     Family History  Problem Relation Age of Onset  . Heart failure Mother   . Stroke Mother   . Heart attack Mother   . Kidney failure Other   . Leukemia Father       Prior to Admission medications   Medication Sig Start Date End Date Taking? Authorizing Provider  albuterol (PROVENTIL HFA;VENTOLIN HFA) 108 (90 BASE) MCG/ACT inhaler Inhale 2 puffs into the lungs every 6 (six) hours as needed for wheezing.    [provider]  albuterol (PROVENTIL) (2.5 MG/3ML) 0.083% nebulizer solution Take 3 mLs (2.5 mg total) by nebulization every 6 (six) hours as needed for wheezing or shortness of breath. 04/29/18   Leatha Gilding, MD  amLODipine (NORVASC) 5 MG tablet Take 5 mg by mouth daily.    [provider]  apixaban (ELIQUIS) 5 MG TABS tablet Take 1 tablet (5 mg total) by mouth 2 (two) times daily. 05/27/18 06/26/18  Tomma Lightning, MD  benzonatate (TESSALON) 200 MG capsule Take 1 capsule (200 mg total) by mouth 3 (three) times daily as needed for cough. 05/05/18   Glenford Bayley, NP  benzonatate (TESSALON) 200 MG capsule Take 1 capsule (200 mg total) by mouth 3 (three) times daily as needed for cough. 05/27/18   Tomma Lightning, MD  Blood Glucose Monitoring Suppl (TRUE METRIX METER) DEVI 1 Device by Does not apply route daily.  04/29/18   Leatha Gilding, MD  chlorthalidone (HYGROTON) 25 MG tablet Take 25 mg by mouth daily.    [provider]  colchicine 0.6 MG tablet Take 1 tablet (0.6 mg total) by mouth daily. 04/29/18   Gherghe, Daylene Katayama, MD  ELIQUIS STARTER PACK (ELIQUIS STARTER PACK) 5 MG TABS Take as directed on package: start with two-5mg  tablets twice daily for 7 days. On day 8, switch to one-5mg  tablet twice daily. 04/28/18   Leatha Gilding, MD  Fluticasone-Salmeterol (ADVAIR) 100-50 MCG/DOSE AEPB Inhale 1 puff into the lungs 2 (two) times daily.    [provider]  LORazepam (ATIVAN) 0.5 MG tablet Take 0.5 mg by  mouth at bedtime.  05/02/18   [provider]  losartan (COZAAR) 100 MG tablet Take 1 tablet (100 mg total) by mouth daily. 02/11/15   Devoria Albe, MD  metFORMIN (GLUCOPHAGE) 500 MG tablet Take 500 mg by mouth daily as needed.     [provider]  pantoprazole (PROTONIX) 40 MG tablet Take 40 mg by mouth daily.    [provider]    Physical Exam: BP 134/81 (BP Location: Left Arm)   Pulse 86   Temp 98.2 F (36.8 C) (Oral)   Resp 18   Ht 5\' 2"  (1.575 m)   Wt 82.5 kg   SpO2 100%   BMI 33.27 kg/m   Exam:  . General: 68 y.o. year-old female well developed well nourished in no acute distress.  Alert and oriented x3. . Cardiovascular: Regular rate and rhythm with no rubs or gallops.  No thyromegaly or JVD noted.   Marland Kitchen Respiratory: Clear to auscultation with no wheezes or rales. Good inspiratory effort. . Abdomen: Soft nontender nondistended with normal bowel sounds x4 quadrants. . Musculoskeletal: No lower extremity edema. 2/4 pulses in all 4 extremities. . Skin: No ulcerative lesions noted or rashes, . Psychiatry: Mood is appropriate for condition and setting           Labs on Admission:  Basic Metabolic Panel: No results for input(s): NA, K, CL, CO2, GLUCOSE, BUN, CREATININE, CALCIUM, MG, PHOS in the last 168 hours. Liver Function Tests: No results for input(s): AST, ALT, ALKPHOS, BILITOT, PROT, ALBUMIN in the last 168 hours. No results for input(s): LIPASE, AMYLASE in the last 168 hours. No results for input(s): AMMONIA in the last 168 hours. CBC: No results for input(s): WBC, NEUTROABS, HGB, HCT, MCV, PLT in the last 168 hours. Cardiac Enzymes: No results for input(s): CKTOTAL, CKMB, CKMBINDEX, TROPONINI in the last 168 hours.  BNP (last 3 results) No results for input(s): BNP in the last 8760 hours.  ProBNP (last 3 results) No results for input(s): PROBNP in the last 8760 hours.  CBG: No results for input(s): GLUCAP in the last 168  hours.  Radiological Exams on Admission: No results found.  EKG: Independently reviewed.  None  Assessment/Plan Present on Admission: . Pulmonary emboli (HCC) . Lung mass . Asthma . DVT (deep venous thrombosis) (HCC)  Principal Problem:   Lung mass Active Problems:   Pulmonary emboli (HCC)   Asthma   Hypertension   Diabetes mellitus without complication (HCC)   DVT (deep venous thrombosis) (HCC)   Pulmonary embolism during treatment with long-term anticoagulation therapy (HCC)   1.  Left basilar lung mass.  For biopsy by pulmonary on June 18, 2018.  Patient is requiring IV heparin bridge.  I have consulted pharmacy for IV heparin dosing.  2.  Type 2 diabetes mellitus  well controlled patient is supposed to be on metformin but stated that she has been controlling it well with diet and has not needed to take her metformin.  3.  Gout it is stable she is supposed to be on Colcrys as needed flareup.  4.  Hypertension stable continue chlorthalidone.  5.  Bronchial asthma stable continue albuterol nebulizer as needed wheezing  6.  History of pulmonary embolism/DVT on long-term anticoagulation with Eliquis 5 mg twice daily.  Due to her need for biopsy of the lung mass her Eliquis needs to be held but patient would not want to hold off for 5 days she is more comfortable having a bridged with heparin and so she is being admitted for IV heparin bridge until her biopsy is done.  On June 18, 2018  7.  Hypokalemia of 2.9.  Patient did not have any labs at the time of this H&P, will replace with potassium IV  Severity of Illness: The appropriate patient status for this patient is INPATIENT. Inpatient status is judged to be reasonable and necessary in order to provide the required intensity of service to ensure the patient's safety. The patient's presenting symptoms, physical exam findings, and initial radiographic and laboratory data in the context of their chronic comorbidities is  felt to place them at high risk for further clinical deterioration. Furthermore, it is not anticipated that the patient will be medically stable for discharge from the hospital within 2 midnights of admission. The following factors support the patient status of inpatient.   " The patient's presenting symptoms include left lung mass. " The worrisome physical exam findings include lung mass. " The initial radiographic and laboratory data are worrisome because of this. " The chronic co-morbidities include pulmonary embolism and DVT on long-term anticoagulation requiring IV heparin as a bridge prior to biopsy.   * I certify that at the point of admission it is my clinical judgment that the patient will require inpatient hospital care spanning beyond 2 midnights from the point of admission due to high intensity of service, high risk for further deterioration and high frequency of surveillance required.*    DVT prophylaxis: SCD heparin  Code Status: Full  Family Communication:  Sister Sarah at bedside  Disposition Plan: Home  Consults called: Pulmonary  Admission status: Patient    Myrtie Neither MD [facility] Pager [phone]  If 7PM-7AM, please contact night-coverage [email or web address] Password [email or web address]  06/16/2018, 3:08 PM

## 2018-06-16 NOTE — Progress Notes (Signed)
Kaitlin [name] [id number]  Code Status: FULL  Admission Data: 06/16/2018 4:35 PM  Attending Provider: Kyung Bacca  MWU:[id number], Carloyn Manner, MD  Consults/ Treatment Team:   [name] is a 68 y.o. female patient admitted from ED awake, alert - oriented X 4 - no acute distress noted. VSS - Blood pressure 134/81, pulse 86, temperature 98.2 F (36.8 C), temperature source Oral, resp. rate 18, height 5\' 2"  (1.575 m), weight 82.5 kg, SpO2 100 %. no c/o shortness of breath, no c/o chest pain. Orientation to room, and floor completed with information packet given to patient/family. Admission INP armband ID verified with patient/family, and in place.  Call light within reach, patient able to voice, and demonstrate understanding. Skin, clean-dry- intact without evidence of bruising, or skin tears.  No evidence of skin break down noted on exam.  ?  Will cont to eval and treat per MD orders.  Melonie Florida, RN  06/16/2018 4:35 PM

## 2018-06-16 NOTE — Telephone Encounter (Signed)
Spoke with pt  She states she is at [facility] and was told they do not have a bed  I called bed placement and spoke with Audelia Acton  She states waiting on a bed and will inform the pt when one becomes open, probably within the next hour  I spoke with the pt and notified of this and she verbalized understanding  Nothing further needed

## 2018-06-16 NOTE — Progress Notes (Signed)
ANTICOAGULATION CONSULT NOTE - Initial Consult  Pharmacy Consult for heparin Indication: PE  Allergies  Allergen Reactions  . Banana Anaphylaxis  . Aspirin Other (See Comments)    G.I. Upset     Patient Measurements: Height: 5\' 2"  (157.5 cm) Weight: 181 lb 14.1 oz (82.5 kg) IBW/kg (Calculated) : 50.1 Heparin Dosing Weight: 68.6 kg  Vital Signs: Temp: 98.2 F (36.8 C) (01/20 1238) Temp Source: Oral (01/20 1238) BP: 134/81 (01/20 1238) Pulse Rate: 86 (01/20 1238)  Labs: Recent Labs    06/16/18 1527  HGB 12.9  HCT 39.1  PLT 306  APTT 29    CrCl cannot be calculated (Patient's most recent lab result is older than the maximum 21 days allowed.).   Medical History: Past Medical History:  Diagnosis Date  . Asthma   . Carpal tunnel syndrome   . Cataract    left eye  . Diabetes mellitus without complication ([place])   . DVT (deep venous thrombosis) ([place])   . Hypertension     Medications:  Eliquis 5mg  BID (last dose 1/19 at 1800)  Assessment: 20 YOF with recent hx of PE in December. Now with left basal lung mass will need CT guided biopsy. Pharmacy is consulted to start IV heparin while eliquis is on hold for procedure.  Per patient last eliquis dose was 1/19 at 1800. Baseline anti-Xa level = 1.94, crcl ~ 65 ml/min  Goal of Therapy:  Heparin level 0.3-0.7 units/ml (aPTT = 66-102) Monitor platelets by anticoagulation protocol: Yes   Plan:  Heparin infusion 1200 units/hr  F/u aPTT at 0200 F/u plans after biopsy  Maryanna Shape, PharmD, BCPS, BCPPS Clinical Pharmacist  Pager: [phone]   06/16/2018,4:44 PM

## 2018-06-17 ENCOUNTER — Ambulatory Visit (HOSPITAL_COMMUNITY): Payer: Medicare HMO | Admitting: Hematology

## 2018-06-17 ENCOUNTER — Encounter (HOSPITAL_COMMUNITY): Payer: Self-pay | Admitting: Student

## 2018-06-17 DIAGNOSIS — E119 Type 2 diabetes mellitus without complications: Secondary | ICD-10-CM

## 2018-06-17 DIAGNOSIS — I10 Essential (primary) hypertension: Secondary | ICD-10-CM

## 2018-06-17 LAB — GLUCOSE, CAPILLARY
GLUCOSE-CAPILLARY: 93 mg/dL (ref 70–99)
Glucose-Capillary: 111 mg/dL — ABNORMAL HIGH (ref 70–99)
Glucose-Capillary: 130 mg/dL — ABNORMAL HIGH (ref 70–99)
Glucose-Capillary: 110 mg/dL — ABNORMAL HIGH (ref 70–99)

## 2018-06-17 LAB — BASIC METABOLIC PANEL
Anion gap: 9 (ref 5–15)
BUN: 9 mg/dL (ref 8–23)
CO2: 25 mmol/L (ref 22–32)
Calcium: 9.6 mg/dL (ref 8.9–10.3)
Chloride: 101 mmol/L (ref 98–111)
Creatinine, Ser: 0.85 mg/dL (ref 0.44–1.00)
GFR calc Af Amer: >60 mL/min (ref >60)
GFR calc non Af Amer: >60 mL/min (ref >60)
Glucose, Bld: 106 mg/dL — ABNORMAL HIGH (ref 70–99)
POTASSIUM: 3.3 mmol/L — AB (ref 3.5–5.1)
Sodium: 135 mmol/L (ref 135–145)

## 2018-06-17 LAB — APTT
aPTT: 68 seconds — ABNORMAL HIGH (ref 24–36)
aPTT: 85 seconds — ABNORMAL HIGH (ref 24–36)

## 2018-06-17 LAB — HIV ANTIBODY (ROUTINE TESTING W REFLEX): HIV Screen 4th Generation wRfx: NONREACTIVE

## 2018-06-17 MED ORDER — INSULIN ASPART 100 UNIT/ML ~~LOC~~ SOLN: 3.0000 [IU] | Freq: Three times a day (TID) | SUBCUTANEOUS | Status: DC

## 2018-06-17 MED ORDER — INSULIN ASPART 100 UNIT/ML ~~LOC~~ SOLN
0.0000 [IU] | Freq: Three times a day (TID) | SUBCUTANEOUS | Status: DC
  Administered 2018-06-17: 1 [IU] via SUBCUTANEOUS

## 2018-06-17 MED ORDER — INSULIN ASPART 100 UNIT/ML ~~LOC~~ SOLN: 0.0000 [IU] | Freq: Every day | SUBCUTANEOUS | Status: DC

## 2018-06-17 NOTE — Care Management Note (Signed)
Case Management Note  Patient Details  Name: Kaitlin [name] MRN: [id number] Date of Birth: [birth year]  Subjective/Objective:     Admitted with  large right PE along with a left lower lobe lung mass. Hx of hypertension, DVT, PE 03/2018, asthma, diabetes mellitus, left cataract, and carpel tunnel syndrome. Resides with daughter, [name]. PTA independent with ADL's, no DME usage.     Judee Hennick (Daughter)      [phone]         PCP: [name]  Action/Plan: Plan : image-guided left lower lobe lung mass biopsy / IR, 06/18/2018.   NCM will continue to monitor for TOC needs.   Expected Discharge Date:                  Expected Discharge Plan:     In-House Referral:     Discharge planning Services     Post Acute Care Choice:    Choice offered to:     DME Arranged:    DME Agency:     HH Arranged:    HH Agency:     Status of Service:  In process, will continue to follow  If discussed at Long Length of Stay Meetings, dates discussed:    Additional Comments:  Sharin Mons, RN 06/17/2018, 2:06 PM

## 2018-06-17 NOTE — Consult Note (Signed)
Chief Complaint: Patient was seen in consultation today for left lower lobe lung mass.  Referring Physician(s): Marylou Mccoy, Ernesto Rutherford Charlynne Cousins  Supervising Physician: Arne [place]  Patient Status: [facility] - In-pt  History of Present Illness: Kaitlin [name] is a 68 y.o. female with a past medical history of hypertension, DVT, PE 03/2018, asthma, diabetes mellitus, left cataract, and carpel tunnel syndrome. She presented to [facility] ED 04/24/2018 with complaints of gradually worsening dyspnea and tachypnea x 3 days. She was found to have a large right PE along with a left lower lobe lung mass and was transferred to [facility] for admission and management. She was discharged home 04/29/2018 in stable condition on chronic anticoagulation with Eliquis along with oncology follow-up. Discharge plan was to continue Eliquis for a minimum of 3 weeks prior to holding for lung mass biopsy to be scheduled on outpatient basis. However, patient was not comfortable holding Eliquis due to prior history PE/DVT. She requested inpatient admission to bridge Eliquis hold with IV Heparin.  CTA chest 04/24/2018: 1. Large pulmonary embolus in descending interlobar pulmonary artery with occlusion of RIGHT lower lobe pulmonary artery and additional smaller clots noted in RIGHT middle and RIGHT upper lobes. 2. Associated RIGHT pleural effusion and atelectasis with diffuse RIGHT lower lobe infiltrate which could represent pneumonia or infarct. 3. LEFT lower lobe mass 3.8 x 3.7 x 2.1 cm associated with hilar and mediastinal adenopathy highly suspicious for a pulmonary neoplasm.  NM PET 05/14/2018: 1. Hypermetabolic left lower lobe pulmonary mass compatible with primary bronchogenic neoplasm. 2. The airspace disease seen previously in the right lower lobe has become less diffuse although is now more organized in the posterior and inferior aspect of the right lower lobe. The disease shows a surrounding  hypermetabolic rim in there is a hypermetabolic nodule medial to the main airspace disease. While this may reflect of evolving infection/inflammation, adenocarcinoma could certainly have this appearance. 3. Hypermetabolic mediastinal and bilateral hilar lymphadenopathy consistent with metastatic disease. 4. Diffusely thickened and hypermetabolic adrenal glands. Metastatic involvement a concern. 5.  Aortic Atherosclerois (ICD10-170.0).  IR requested by Geraldo Pitter, NP, Dr. Ander Slade, and Dr. Aileen Fass for possible image-guided left lower lobe lung biopsy. Patient awake and alert laying in bed with no complaints at this time. Accompanied by 4 family members at bedside. Denies fever, chills, chest pain, dyspnea, abdominal pain, dizziness, or headache.  Patient is currently taking Eliquis 5 mg twice daily- reports last dose 06/15/2018 at 1800. Patient is currently receiving Heparin continuous infusion.   Past Medical History:  Diagnosis Date  . Asthma   . Carpal tunnel syndrome   . Cataract    left eye  . Diabetes mellitus without complication ([place])   . DVT (deep venous thrombosis) ([place])   . Hypertension     Past Surgical History:  Procedure Laterality Date  . ABDOMINAL HYSTERECTOMY    . biopsy of right breast    . FOOT SURGERY    . implant left eye    . INTRAOCULAR LENS INSERTION      Allergies: Banana and Aspirin  Medications: Prior to Admission medications   Medication Sig Start Date End Date Taking? Authorizing Provider  albuterol (PROVENTIL HFA;VENTOLIN HFA) 108 (90 BASE) MCG/ACT inhaler Inhale 2 puffs into the lungs every 6 (six) hours as needed for wheezing.   Yes [provider]  albuterol (PROVENTIL) (2.5 MG/3ML) 0.083% nebulizer solution Take 3 mLs (2.5 mg total) by nebulization every 6 (six) hours as  needed for wheezing or shortness of breath. 04/29/18  Yes Gherghe, Vella Redhead, MD  amLODipine (NORVASC) 5 MG tablet Take 5 mg by mouth daily.   Yes [provider]  apixaban (ELIQUIS) 5 MG TABS tablet Take 1 tablet (5 mg total) by mouth 2 (two) times daily. 05/27/18 06/26/18 Yes Olalere, Adewale A, MD  benzonatate (TESSALON) 200 MG capsule Take 1 capsule (200 mg total) by mouth 3 (three) times daily as needed for cough. 05/05/18  Yes Martyn Ehrich, NP  chlorthalidone (HYGROTON) 25 MG tablet Take 25 mg by mouth daily.   Yes [provider]  colchicine 0.6 MG tablet Take 1 tablet (0.6 mg total) by mouth daily. Patient taking differently: Take 0.6 mg by mouth daily as needed (gout flare up).  04/29/18  Yes Gherghe, Vella Redhead, MD  LORazepam (ATIVAN) 0.5 MG tablet Take 0.5 mg by mouth at bedtime as needed for sleep.  05/02/18  Yes [provider]  losartan (COZAAR) 100 MG tablet Take 1 tablet (100 mg total) by mouth daily. 02/11/15  Yes Rolland Porter, MD  Blood Glucose Monitoring Suppl (TRUE METRIX METER) DEVI 1 Device by Does not apply route daily. 04/29/18   Gherghe, Vella Redhead, MD  ELIQUIS STARTER PACK (ELIQUIS STARTER PACK) 5 MG TABS Take as directed on package: start with two-5mg  tablets twice daily for 7 days. On day 8, switch to one-5mg  tablet twice daily. Patient not taking: Reported on 06/16/2018 04/28/18   Caren Griffins, MD     Family History  Problem Relation Age of Onset  . Heart failure Mother   . Stroke Mother   . Heart attack Mother   . Kidney failure Other   . Leukemia Father     Social History   Socioeconomic History  . Marital status: Widowed    Spouse name: Not on file  . Number of children: 4  . Years of education: 8  . Highest education level: High school graduate  Occupational History  . Not on file  Social Needs  . Financial resource strain: Very hard  . Food insecurity:    Worry: Sometimes true    Inability: Sometimes true  . Transportation needs:    Medical: No    Non-medical: No  Tobacco Use  . Smoking status: Never Smoker  . Smokeless tobacco: Never Used  Substance and Sexual Activity   . Alcohol use: Not Currently    Comment: occ  . Drug use: No  . Sexual activity: Yes    Partners: Male    Birth control/protection: Surgical  Lifestyle  . Physical activity:    Days per week: 0 days    Minutes per session: Not on file  . Stress: Only a little  Relationships  . Social connections:    Talks on phone: More than three times a week    Gets together: More than three times a week    Attends religious service: More than 4 times per year    Active member of club or organization: Yes    Attends meetings of clubs or organizations: More than 4 times per year    Relationship status: Widowed  Other Topics Concern  . 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  Review of Systems: A 12 point ROS discussed and pertinent positives are indicated in the HPI above.  All other systems are negative.  Review of Systems  Constitutional: Negative for chills and fever.  Respiratory: Negative for shortness of breath and wheezing.   Cardiovascular: Negative for chest pain and palpitations.  Gastrointestinal: Negative for abdominal pain.  Neurological: Negative for dizziness and headaches.    Vital Signs: BP 136/73 (BP Location: Right Arm)   Pulse 99   Temp 98.1 F (36.7 C)   Resp 19   Ht 5\' 2"  (1.575 m)   Wt 181 lb 14.1 oz (82.5 kg)   SpO2 98%   BMI 33.27 kg/m   Physical Exam Vitals signs and nursing note reviewed.  Constitutional:      General: She is not in acute distress.    Appearance: Normal appearance.  Cardiovascular:     Rate and Rhythm: Normal rate and regular rhythm.     Heart sounds: Normal heart sounds. No murmur.  Pulmonary:     Effort: Pulmonary effort is normal. No respiratory distress.     Breath sounds: Normal breath sounds. No wheezing.   Skin:    General: Skin is warm and dry.  Neurological:     Mental Status: She is alert and oriented to person, place, and time.  Psychiatric:        Mood and Affect: Mood normal.        Behavior: Behavior normal.        Thought Content: Thought content normal.        Judgment: Judgment normal.      MD Evaluation Airway: WNL Heart: WNL Abdomen: WNL Chest/ Lungs: WNL ASA  Classification: 2 Mallampati/Airway Score: One   Imaging: Mr Jeri Cos PT Contrast  Result Date: 05/30/2018 CLINICAL DATA:  Lung cancer staging. EXAM: MRI HEAD WITHOUT AND WITH CONTRAST TECHNIQUE: Multiplanar, multiecho pulse sequences of the brain and surrounding structures were obtained without and with intravenous contrast. CONTRAST:  7.5 mL Gadavist COMPARISON:  None. FINDINGS: Brain: Enhancing cerebellar lesions measure 4 mm in the inferior vermis (series 13, image 16) and 3 mm in the left cerebellar hemisphere (series 13, image 20). An enhancing lesion in the right pons measures 3 mm (series 13, image 26). There are 17 enhancing supratentorial lesions with the largest measuring 6 mm in left frontal lobe (series 13, image 49). There is no associated edema. No acute infarct, intracranial hemorrhage, midline shift, or extra-axial fluid collection is evident. The ventricles and sulci are within normal limits for age. Scattered T2 hyperintensities in the cerebral white matter separate from the enhancing lesions are nonspecific but compatible with mild chronic small vessel ischemic disease. Vascular: Major intracranial vascular flow voids are preserved. Skull and upper cervical spine: Unremarkable bone marrow signal. Sinuses/Orbits: Left cataract extraction. Postsurgical changes and mild mucosal thickening in the paranasal sinuses. Clear mastoid air cells. Other: None. IMPRESSION: 1. Twenty subcentimeter enhancing brain lesions consistent with metastases. No edema. 2. Mild chronic small vessel ischemic disease. Electronically  Signed   By: Logan Bores M.D.   On: 05/30/2018 12:23    Labs:  CBC: Recent Labs    04/27/18 1332 04/28/18 0219 04/28/18 1331 06/16/18 1527  WBC 13.3* 13.4* 13.1* 5.2  HGB 9.2* 8.7* 9.0* 12.9  HCT 28.8* 27.0* 29.0* 39.1  PLT 402* 441* 520* 306    COAGS: Recent Labs    06/16/18 1527 06/17/18 0215 06/17/18 1134  APTT 29 68* 85*    BMP: Recent Labs  04/28/18 0219 04/29/18 0216 06/16/18 1527 06/17/18 0215  NA 134* 135 136 135  K 3.0* 3.9 2.9* 3.3*  CL 95* 97* 99 101  CO2 27 27 26 25   GLUCOSE 108* 114* 85 106*  BUN 6* <5* 8 9  CALCIUM 8.7* 8.9 9.9 9.6  CREATININE 0.72 0.83 0.84 0.85  GFRNONAA >60 >60 >60 >60  GFRAA >60 >60 >60 >60    LIVER FUNCTION TESTS: Recent Labs    04/24/18 1347 06/16/18 1527  BILITOT 1.9* 0.5  AST 54* 19  ALT 44 14  ALKPHOS 188* 100  PROT 7.8 7.9  ALBUMIN 3.5 3.9    TUMOR MARKERS: No results for input(s): AFPTM, CEA, CA199, CHROMGRNA in the last 8760 hours.  Assessment and Plan:  Left lower lobe lung mass. Plan for image-guided left lower lobe lung mass biopsy tentatively for 06/18/2018 with Dr. Barbie Banner. Patient will be NPO at midnight. Afebrile and WBCs WNL. Patient is currently taking Eliquis 5 mg twice daily, reports last dose 06/15/2018 at 1800- ok to proceed 06/18/2018 per IR protocol. Patient is currently receiving Heparin continuous infusion- will hold tomorrow AM per IR protocol. INR pending.  Risks and benefits discussed with the patient including, but not limited to bleeding, hemoptysis, respiratory failure requiring intubation, infection, pneumothorax requiring chest tube placement, stroke from air embolism or even death. All of the patient's questions were answered, patient is agreeable to proceed. Consent signed and in chart.   Thank you for this interesting consult.  I greatly enjoyed meeting WANIYA HOGLUND and look forward to participating in their care.  A copy of this report was sent to the requesting provider  on this date.  Electronically Signed: Earley Abide, PA-C 06/17/2018, 1:17 PM   I spent a total of 40 Minutes in face to face in clinical consultation, greater than 50% of which was counseling/coordinating care for left lower lobe lung mass.

## 2018-06-17 NOTE — Progress Notes (Signed)
[facility] PROGRESS NOTE    Progress Note  Kaitlin [name]  YNW:[id number] DOB: 1950/07/01 DOA: 06/16/2018 PCP: [name], MD     Brief Narrative:   Kaitlin [name] is an 68 y.o. female  past medical history diabetes mellitus noncompliant with her medication, essential hypertension history of DVT and PE on Eliquis, presents for admission as request of her pulmonologist for left basal lung mass, she was transitioned to IV heparin while she washes off Eliquis.  Assessment/Plan:   Left basilar Lung mass: For surgery on 06/18/2018. Eliquis has been discontinued, she was started on IV heparin per pharmacy. Consult IR  for CT-guided biopsy continue regular diet IR to dictate diet if she needs to be n.p.o. for procedure.  Diabetes mellitus type 2: Hold metformin, she was started on sliding scale insulin.  Pulmonary emboli [facility]): Currently off Eliquis for biopsy, on IV heparin.  Gout: Continue colchicine.  Essential hypertension: Stable, continue chlorthalidone.  Hypokalemia: Continue to replete orally recheck a basic metabolic panel in the morning.   DVT prophylaxis: heparin Family Communication:none Disposition Plan/Barrier to D/C: home in am Code Status:     Code Status Orders  (From admission, onward)         Start     Ordered   06/16/18 1509  Full code  Continuous     06/16/18 1508        Code Status History    Date Active Date Inactive Code Status Order ID Comments User Context   04/26/2018 1252 04/29/2018 1503 Full Code [id number]  Pamalee Leyden, RN Inpatient   09/03/2012 1126 09/07/2012 1841 Full Code [id number]  [name], MD Inpatient        IV Access:    Peripheral IV   Procedures and diagnostic studies:   No results found.   Medical Consultants:    None.  Anti-Infectives:   None  Subjective:    Floyce Stakes she denies any pain or shortness of breath.  Objective:    Vitals:   06/16/18 2119 06/16/18 2259 06/17/18 0635 06/17/18  0740  BP:  108/82 105/70   Pulse: 88 86 84   Resp: 18 18    Temp:  98.6 F (37 C) 98.3 F (36.8 C)   TempSrc:  Oral    SpO2: 100% 100% 97% 96%  Weight:      Height:        Intake/Output Summary (Last 24 hours) at 06/17/2018 1018 Last data filed at 06/17/2018 0441 Gross per 24 hour  Intake 520.09 ml  Output -  Net 520.09 ml   Filed Weights   06/16/18 1237  Weight: 82.5 kg    Exam: General exam: In no acute distress. Respiratory system: Good air movement and clear to auscultation. Cardiovascular system: S1 & S2 heard, RRR.  Gastrointestinal system: Abdomen is nondistended, soft and nontender.  Central nervous system: Alert and oriented. No focal neurological deficits. Extremities: No pedal edema. Skin: No rashes, lesions or ulcers Psychiatry: Judgement and insight appear normal. Mood & affect appropriate.    Data Reviewed:    Labs: Basic Metabolic Panel: Recent Labs  Lab 06/16/18 1527 06/17/18 0215  NA 136 135  K 2.9* 3.3*  CL 99 101  CO2 26 25  GLUCOSE 85 106*  BUN 8 9  CREATININE 0.84 0.85  CALCIUM 9.9 9.6   GFR Estimated Creatinine Clearance: 64 mL/min (by C-G formula based on SCr of 0.85 mg/dL). Liver Function Tests: Recent Labs  Lab 06/16/18 1527  AST  19  ALT 14  ALKPHOS 100  BILITOT 0.5  PROT 7.9  ALBUMIN 3.9   No results for input(s): LIPASE, AMYLASE in the last 168 hours. No results for input(s): AMMONIA in the last 168 hours. Coagulation profile No results for input(s): INR, PROTIME in the last 168 hours.  CBC: Recent Labs  Lab 06/16/18 1527  WBC 5.2  NEUTROABS 3.0  HGB 12.9  HCT 39.1  MCV 80.8  PLT 306   Cardiac Enzymes: No results for input(s): CKTOTAL, CKMB, CKMBINDEX, TROPONINI in the last 168 hours. BNP (last 3 results) No results for input(s): PROBNP in the last 8760 hours. CBG: Recent Labs  Lab 06/16/18 1712 06/16/18 2304 06/17/18 0754  GLUCAP 140* 91 93   D-Dimer: No results for input(s): DDIMER in the last  72 hours. Hgb A1c: Recent Labs    06/16/18 1527  HGBA1C 6.1*   Lipid Profile: No results for input(s): CHOL, HDL, LDLCALC, TRIG, CHOLHDL, LDLDIRECT in the last 72 hours. Thyroid function studies: No results for input(s): TSH, T4TOTAL, T3FREE, THYROIDAB in the last 72 hours.  Invalid input(s): FREET3 Anemia work up: No results for input(s): VITAMINB12, FOLATE, FERRITIN, TIBC, IRON, RETICCTPCT in the last 72 hours. Sepsis Labs: Recent Labs  Lab 06/16/18 1527  WBC 5.2   Microbiology No results found for this or any previous visit (from the past 240 hour(s)).   Medications:   . chlorthalidone  25 mg Oral Daily  . insulin aspart  0-9 Units Subcutaneous TID WC  . LORazepam  0.5 mg Oral QHS  . mometasone-formoterol  2 puff Inhalation BID  . pantoprazole  40 mg Oral Daily   Continuous Infusions: . heparin 1,200 Units/hr (06/16/18 1834)      LOS: 1 day   Marinda Elk  [facility]  06/17/2018, 10:18 AM

## 2018-06-17 NOTE — Progress Notes (Signed)
ANTICOAGULATION CONSULT NOTE -   Pharmacy Consult for Heparin bridge (holding Apixaban) Indication: History of DVT/PE  Allergies  Allergen Reactions  . Banana Anaphylaxis  . Aspirin Other (See Comments)    G.I. Upset     Patient Measurements: Height: 5\' 2"  (157.5 cm) Weight: 181 lb 14.1 oz (82.5 kg) IBW/kg (Calculated) : 50.1 Heparin Dosing Weight: 68.6 kg  Vital Signs: Temp: 98.3 F (36.8 C) (01/21 0635) BP: 105/70 (01/21 0635) Pulse Rate: 84 (01/21 0635)  Labs: Recent Labs    06/16/18 1527 06/17/18 0215 06/17/18 1134  HGB 12.9  --   --   HCT 39.1  --   --   PLT 306  --   --   APTT 29 68* 85*  HEPARINUNFRC 1.94*  --   --   CREATININE 0.84 0.85  --     Estimated Creatinine Clearance: 64 mL/min (by C-G formula based on SCr of 0.85 mg/dL).   Medical History: Past Medical History:  Diagnosis Date  . Asthma   . Carpal tunnel syndrome   . Cataract    left eye  . Diabetes mellitus without complication ([place])   . DVT (deep venous thrombosis) ([place])   . Hypertension     Medications:  Eliquis 5mg  BID (last dose 1/19 at 1800)  Assessment: 83 YOF with recent hx of PE/DVT in December. Now with left basal lung mass will need CT guided biopsy. Pharmacy is consulted to start IV heparin while eliquis is on hold for procedure.  Per patient last eliquis dose was 1/19 at 1800. Baseline anti-Xa level = 1.94, crcl ~ 65 ml/min. Will use aPTT to guide dosing for now.   Confirmatory aPTT is therapeutic (85 sec) as well. Continue current dose and get aPTT in AM   Goal of Therapy:  Heparin level 0.3-0.7 units/ml (aPTT = 66-102) Monitor platelets by anticoagulation protocol: Yes   Plan:  Cont heparin at 1200 units/hr Daily aPTT, HL  Saban Heinlen A. Levada Dy, PharmD, [place] Pager: [phone] Please utilize Amion for appropriate phone number to reach the unit pharmacist ([place])

## 2018-06-17 NOTE — Progress Notes (Signed)
ANTICOAGULATION CONSULT NOTE -   Pharmacy Consult for Heparin bridge (holding Apixaban) Indication: History of DVT/PE  Allergies  Allergen Reactions  . Banana Anaphylaxis  . Aspirin Other (See Comments)    G.I. Upset     Patient Measurements: Height: 5\' 2"  (157.5 cm) Weight: 181 lb 14.1 oz (82.5 kg) IBW/kg (Calculated) : 50.1 Heparin Dosing Weight: 68.6 kg  Vital Signs: Temp: 98.6 F (37 C) (01/20 2259) Temp Source: Oral (01/20 2259) BP: 108/82 (01/20 2259) Pulse Rate: 86 (01/20 2259)  Labs: Recent Labs    06/16/18 1527 06/17/18 0215  HGB 12.9  --   HCT 39.1  --   PLT 306  --   APTT 29 68*  HEPARINUNFRC 1.94*  --   CREATININE 0.84 0.85    Estimated Creatinine Clearance: 64 mL/min (by C-G formula based on SCr of 0.85 mg/dL).   Medical History: Past Medical History:  Diagnosis Date  . Asthma   . Carpal tunnel syndrome   . Cataract    left eye  . Diabetes mellitus without complication ([place])   . DVT (deep venous thrombosis) ([place])   . Hypertension     Medications:  Eliquis 5mg  BID (last dose 1/19 at 1800)  Assessment: 37 YOF with recent hx of PE/DVT in December. Now with left basal lung mass will need CT guided biopsy. Pharmacy is consulted to start IV heparin while eliquis is on hold for procedure.  Per patient last eliquis dose was 1/19 at 1800. Baseline anti-Xa level = 1.94, crcl ~ 65 ml/min. Will use aPTT to guide dosing for now.   1/21 AM update: initial aPTT is therapeutic   Goal of Therapy:  Heparin level 0.3-0.7 units/ml (aPTT = 66-102) Monitor platelets by anticoagulation protocol: Yes   Plan:  Cont heparin at 1200 units/hr 1200 aPTT  Narda Bonds, PharmD, BCPS Clinical Pharmacist Phone: [phone]

## 2018-06-18 ENCOUNTER — Inpatient Hospital Stay (HOSPITAL_COMMUNITY): Payer: Medicare HMO

## 2018-06-18 ENCOUNTER — Ambulatory Visit (HOSPITAL_COMMUNITY)
Admission: RE | Admit: 2018-06-18 | Discharge: 2018-06-18 | Disposition: A | Discharge status: Home / Self Care | Payer: Medicare HMO | Source: Ambulatory Visit | Attending: Primary Care | Admitting: Primary Care

## 2018-06-18 DIAGNOSIS — R918 Other nonspecific abnormal finding of lung field: Secondary | ICD-10-CM | POA: Diagnosis not present

## 2018-06-18 DIAGNOSIS — I824Y9 Acute embolism and thrombosis of unspecified deep veins of unspecified proximal lower extremity: Secondary | ICD-10-CM

## 2018-06-18 DIAGNOSIS — C3432 Malignant neoplasm of lower lobe, left bronchus or lung: Secondary | ICD-10-CM | POA: Insufficient documentation

## 2018-06-18 LAB — BASIC METABOLIC PANEL
Anion gap: 11 (ref 5–15)
BUN: 7 mg/dL — AB (ref 8–23)
CO2: 25 mmol/L (ref 22–32)
Calcium: 9.6 mg/dL (ref 8.9–10.3)
Chloride: 99 mmol/L (ref 98–111)
Creatinine, Ser: 0.95 mg/dL (ref 0.44–1.00)
GFR calc Af Amer: >60 mL/min (ref >60)
GFR calc non Af Amer: >60 mL/min (ref >60)
GLUCOSE: 114 mg/dL — AB (ref 70–99)
Potassium: 3 mmol/L — ABNORMAL LOW (ref 3.5–5.1)
SODIUM: 135 mmol/L (ref 135–145)

## 2018-06-18 LAB — PROTIME-INR
INR: 1.21
PROTHROMBIN TIME: 15.2 s (ref 11.4–15.2)

## 2018-06-18 LAB — CBC
HCT: 36.2 % (ref 36.0–46.0)
Hemoglobin: 11.7 g/dL — ABNORMAL LOW (ref 12.0–15.0)
MCH: 26.2 pg (ref 26.0–34.0)
MCHC: 32.3 g/dL (ref 30.0–36.0)
MCV: 81 fL (ref 80.0–100.0)
PLATELETS: 278 10*3/uL (ref 150–400)
RBC: 4.47 MIL/uL (ref 3.87–5.11)
RDW: 14.7 % (ref 11.5–15.5)
WBC: 8.1 10*3/uL (ref 4.0–10.5)
nRBC: 0 % (ref 0.0–0.2)

## 2018-06-18 LAB — HEPARIN LEVEL (UNFRACTIONATED): Heparin Unfractionated: 0.93 IU/mL — ABNORMAL HIGH (ref 0.30–0.70)

## 2018-06-18 LAB — GLUCOSE, CAPILLARY
Glucose-Capillary: 109 mg/dL — ABNORMAL HIGH (ref 70–99)
Glucose-Capillary: 82 mg/dL (ref 70–99)

## 2018-06-18 LAB — APTT: aPTT: 80 seconds — ABNORMAL HIGH (ref 24–36)

## 2018-06-18 MED ORDER — FENTANYL CITRATE (PF) 100 MCG/2ML IJ SOLN
INTRAMUSCULAR | Status: AC | PRN
  Administered 2018-06-18 (×2): 50 ug via INTRAVENOUS

## 2018-06-18 MED ORDER — PANTOPRAZOLE SODIUM 40 MG PO TBEC: 40.0000 mg | DELAYED_RELEASE_TABLET | Freq: Every day | ORAL | 0 refills | Status: DC

## 2018-06-18 MED ORDER — MIDAZOLAM HCL 2 MG/2ML IJ SOLN
INTRAMUSCULAR | Status: AC | PRN
  Administered 2018-06-18 (×2): 1 mg via INTRAVENOUS

## 2018-06-18 MED ORDER — POTASSIUM CHLORIDE CRYS ER 20 MEQ PO TBCR
20.0000 meq | EXTENDED_RELEASE_TABLET | Freq: Once | ORAL | Status: DC
  Filled 2018-06-18: qty 1

## 2018-06-18 MED ORDER — HEPARIN (PORCINE) 25000 UT/250ML-% IV SOLN: 1200.0000 [IU]/h | INTRAVENOUS | Status: DC

## 2018-06-18 MED ORDER — FENTANYL CITRATE (PF) 100 MCG/2ML IJ SOLN
INTRAMUSCULAR | Status: AC
  Filled 2018-06-18: qty 4

## 2018-06-18 MED ORDER — MIDAZOLAM HCL 2 MG/2ML IJ SOLN
INTRAMUSCULAR | Status: AC
  Filled 2018-06-18: qty 4

## 2018-06-18 MED ORDER — POTASSIUM CHLORIDE CRYS ER 20 MEQ PO TBCR
40.0000 meq | EXTENDED_RELEASE_TABLET | Freq: Once | ORAL | Status: AC
  Administered 2018-06-18: 40 meq via ORAL
  Filled 2018-06-18: qty 2

## 2018-06-18 MED ORDER — APIXABAN 5 MG PO TABS: 5.0000 mg | ORAL_TABLET | Freq: Two times a day (BID) | ORAL | 3 refills | Status: DC

## 2018-06-18 MED ORDER — LIDOCAINE HCL 1 % IJ SOLN
INTRAMUSCULAR | Status: AC
  Filled 2018-06-18: qty 20

## 2018-06-18 NOTE — Sedation Documentation (Signed)
Patient denies pain and is resting comfortably.  

## 2018-06-18 NOTE — Progress Notes (Signed)
Discharge instructions (including medications) discussed with and copy provided to patient/caregiver 

## 2018-06-18 NOTE — Procedures (Signed)
LLL lung mass core Bx 18 g times three EBL 0 Comp 0

## 2018-06-18 NOTE — Progress Notes (Signed)
ANTICOAGULATION CONSULT NOTE -   Pharmacy Consult for Heparin bridge (holding Apixaban) Indication: History of DVT/PE  Allergies  Allergen Reactions  . Banana Anaphylaxis  . Aspirin Other (See Comments)    G.I. Upset     Patient Measurements: Height: 5\' 2"  (157.5 cm) Weight: 181 lb 14.1 oz (82.5 kg) IBW/kg (Calculated) : 50.1 Heparin Dosing Weight: 68.6 kg  Vital Signs: Temp: 99.5 F (37.5 C) (01/22 0442) BP: 116/87 (01/22 0442) Pulse Rate: 92 (01/22 0442)  Labs: Recent Labs    06/16/18 1527 06/17/18 0215 06/17/18 1134 06/18/18 0434 06/18/18 0438  HGB 12.9  --   --   --  11.7*  HCT 39.1  --   --   --  36.2  PLT 306  --   --   --  278  APTT 29 68* 85*  --  80*  LABPROT  --   --   --   --  15.2  INR  --   --   --   --  1.21  HEPARINUNFRC 1.94*  --   --  0.93*  --   CREATININE 0.84 0.85  --   --  0.95    Estimated Creatinine Clearance: 57.2 mL/min (by C-G formula based on SCr of 0.95 mg/dL).   Medical History: Past Medical History:  Diagnosis Date  . Asthma   . Carpal tunnel syndrome   . Cataract    left eye  . Diabetes mellitus without complication ([place])   . DVT (deep venous thrombosis) ([place])   . Hypertension   . Pulmonary embolism ([place]) 03/2018    Medications:  Eliquis 5mg  BID (last dose 1/19 at 1800)  Assessment: 68 YOF with recent hx of PE/DVT in December. Now with left basal lung mass will need CT guided biopsy. Pharmacy is consulted to start IV heparin while eliquis is on hold for procedure.  Per patient last eliquis dose was 1/19 at 1800. Baseline anti-Xa level = 1.94, crcl ~ 65 ml/min. Will use aPTT to guide dosing for now.   Patient scheduled for lung biopsy today. Heparin has been placed on hold this AM with orders to restart at 1500. PTT 80 sec with Heparin level of 0.93 (not correlating due to residual effect of apixaban). Will restart at 1500 with no bolus as previous rate of 1200 units/hr.    Goal of Therapy:  Heparin level 0.3-0.7  units/ml (aPTT = 66-102) Monitor platelets by anticoagulation protocol: Yes   Plan:  Restart heparin at 1200 units/hr at 1500 per IR orders Daily aPTT, HL  Emmette Katt A. Levada Dy, PharmD, [place] Pager: [phone] Please utilize Amion for appropriate phone number to reach the unit pharmacist ([place])

## 2018-06-18 NOTE — Discharge Summary (Signed)
PATIENT DETAILS Name: Kaitlin [name] Age: 68 y.o. Sex: female Date of Birth: 1950/11/18 MRN: [id number]. Admitting Physician: Tomma Lightning, MD WUJ:[id number], Channing Mutters, MD  Admit Date: 06/16/2018 Discharge date: 06/18/2018  Recommendations for Outpatient Follow-up:  1. Follow up with PCP in 1-2 weeks 2. Please obtain BMP/CBC in one week 3. Please follow up on the following pending results: Lung biopsy  Admitted From:  Home  Disposition: Home   Home Health: No  Equipment/Devices: None  Discharge Condition: Stable  CODE STATUS: FULL CODE  Diet recommendation:  Heart Healthy / Carb Modified  Brief Summary: See H&P, Labs, Consult and Test reports for all details in brief, patient is a 68 year old female who recently (November 2019) had a DVT/PE-she was found to have a lung mass as well-she was admitted to the hospital at the request of her pulmonologist for left lung biopsy, she was placed on IV heparin-and underwent a lung biopsy on 1/22.  Brief Hospital Course: Left lung mass: CT-guided lung biopsy on 1/22-spoke with IR Ralene Muskrat, PA-C)-okay to discharge home today-okay to resume Eliquis on 1/23.  Epic message sent to oncology team at [facility] to arrange for outpatient follow-up (patient has already seen oncology there).  Patient also asked to call oncology office if she does not hear from them in the next few days.  Recent history of pulmonary embolism: Was placed on IV heparin-subsequently underwent a lung biopsy on 1/22-IR recommending restarting anticoagulation on 1/23  Hypertension: Resume usual antihypertensives  History of gout: No evidence of flare during this hospital stay-continue colchicine.  Procedures/Studies: 1/22>> CT guided biopsy of left lower lung mass  Discharge Diagnoses:  Principal Problem:   Lung mass Active Problems:   Pulmonary emboli (H[facility])   Asthma   Hypertension   Diabetes mellitus without complication (H[facility])   DVT (deep  venous thrombosis) (H[facility])   Pulmonary embolism during treatment with long-term anticoagulation therapy [facility])   Discharge Instructions:  Activity:  As tolerated  Discharge Instructions    Diet - low sodium heart healthy   Complete by:  As directed    Discharge instructions   Complete by:  As directed    Follow with Primary MD  [name], MD in 1 week  Follow with primary oncologist within a week to follow up lung biopsy results.  Resume Eliquis on 1/23  Please get a complete blood count and chemistry panel checked by your Primary MD at your next visit, and again as instructed by your Primary MD.  Get Medicines reviewed and adjusted: Please take all your medications with you for your next visit with your Primary MD  Laboratory/radiological data: Please request your Primary MD to go over all hospital tests and procedure/radiological results at the follow up, please ask your Primary MD to get all Hospital records sent to his/her office.  In some cases, they will be blood work, cultures and biopsy results pending at the time of your discharge. Please request that your primary care M.D. follows up on these results.  Also Note the following: If you experience worsening of your admission symptoms, develop shortness of breath, life threatening emergency, suicidal or homicidal thoughts you must seek medical attention immediately by calling 911 or calling your MD immediately  if symptoms less severe.  You must read complete instructions/literature along with all the possible adverse reactions/side effects for all the Medicines you take and that have been prescribed to you. Take any new Medicines after you have completely understood and accpet  all the possible adverse reactions/side effects.   Do not drive when taking Pain medications or sleeping medications (Benzodaizepines)  Do not take more than prescribed Pain, Sleep and Anxiety Medications. It is not advisable to combine anxiety,sleep  and pain medications without talking with your primary care practitioner  Special Instructions: If you have smoked or chewed Tobacco  in the last 2 yrs please stop smoking, stop any regular Alcohol  and or any Recreational drug use.  Wear Seat belts while driving.  Please note: You were cared for by a hospitalist during your hospital stay. Once you are discharged, your primary care physician will handle any further medical issues. Please note that NO REFILLS for any discharge medications will be authorized once you are discharged, as it is imperative that you return to your primary care physician (or establish a relationship with a primary care physician if you do not have one) for your post hospital discharge needs so that they can reassess your need for medications and monitor your lab values.   Increase activity slowly   Complete by:  As directed      Allergies as of 06/18/2018      Reactions   Banana Anaphylaxis   Aspirin Other (See Comments)   G.I. Upset       Medication List    TAKE these medications   albuterol (2.5 MG/3ML) 0.083% nebulizer solution Commonly known as:  PROVENTIL Take 3 mLs (2.5 mg total) by nebulization every 6 (six) hours as needed for wheezing or shortness of breath.   albuterol 108 (90 Base) MCG/ACT inhaler Commonly known as:  PROVENTIL HFA;VENTOLIN HFA Inhale 2 puffs into the lungs every 6 (six) hours as needed for wheezing.   amLODipine 5 MG tablet Commonly known as:  NORVASC Take 5 mg by mouth daily.   apixaban 5 MG Tabs tablet Commonly known as:  ELIQUIS Take 1 tablet (5 mg total) by mouth 2 (two) times daily. Start taking on:  June 19, 2018 What changed:  Another medication with the same name was removed. Continue taking this medication, and follow the directions you see here.   benzonatate 200 MG capsule Commonly known as:  TESSALON Take 1 capsule (200 mg total) by mouth 3 (three) times daily as needed for cough.   chlorthalidone 25 MG  tablet Commonly known as:  HYGROTON Take 25 mg by mouth daily.   colchicine 0.6 MG tablet Take 1 tablet (0.6 mg total) by mouth daily. What changed:    when to take this  reasons to take this   LORazepam 0.5 MG tablet Commonly known as:  ATIVAN Take 0.5 mg by mouth at bedtime as needed for sleep.   losartan 100 MG tablet Commonly known as:  COZAAR Take 1 tablet (100 mg total) by mouth daily.   pantoprazole 40 MG tablet Commonly known as:  PROTONIX Take 1 tablet (40 mg total) by mouth daily.   TRUE METRIX METER Devi 1 Device by Does not apply route daily.       Allergies  Allergen Reactions  . Banana Anaphylaxis  . Aspirin Other (See Comments)    G.IDorena Dew     Consultations:   IR  Other Procedures/Studies: Mr Laqueta Jean Wo Contrast  Result Date: 05/30/2018 CLINICAL DATA:  Lung cancer staging. EXAM: MRI HEAD WITHOUT AND WITH CONTRAST TECHNIQUE: Multiplanar, multiecho pulse sequences of the brain and surrounding structures were obtained without and with intravenous contrast. CONTRAST:  7.5 mL Gadavist COMPARISON:  None. FINDINGS: Brain: Enhancing  cerebellar lesions measure 4 mm in the inferior vermis (series 13, image 16) and 3 mm in the left cerebellar hemisphere (series 13, image 20). An enhancing lesion in the right pons measures 3 mm (series 13, image 26). There are 17 enhancing supratentorial lesions with the largest measuring 6 mm in left frontal lobe (series 13, image 49). There is no associated edema. No acute infarct, intracranial hemorrhage, midline shift, or extra-axial fluid collection is evident. The ventricles and sulci are within normal limits for age. Scattered T2 hyperintensities in the cerebral white matter separate from the enhancing lesions are nonspecific but compatible with mild chronic small vessel ischemic disease. Vascular: Major intracranial vascular flow voids are preserved. Skull and upper cervical spine: Unremarkable bone marrow signal.  Sinuses/Orbits: Left cataract extraction. Postsurgical changes and mild mucosal thickening in the paranasal sinuses. Clear mastoid air cells. Other: None. IMPRESSION: 1. Twenty subcentimeter enhancing brain lesions consistent with metastases. No edema. 2. Mild chronic small vessel ischemic disease. Electronically Signed   By: Sebastian Ache M.D.   On: 05/30/2018 12:23   Ct Biopsy  Result Date: 06/18/2018 INDICATION: Left lower lobe hypermetabolic lung mass EXAM: CT BIOPSY MEDICATIONS: None. ANESTHESIA/SEDATION: Fentanyl 100 mcg IV; Versed 2 mg IV Moderate Sedation Time:  13 minutes The patient was continuously monitored during the procedure by the interventional radiology nurse under my direct supervision. FLUOROSCOPY TIME:  None COMPLICATIONS: None immediate. PROCEDURE: Informed written consent was obtained from the patient after a thorough discussion of the procedural risks, benefits and alternatives. All questions were addressed. Maximal Sterile Barrier Technique was utilized including caps, mask, sterile gowns, sterile gloves, sterile drape, hand hygiene and skin antiseptic. A timeout was performed prior to the initiation of the procedure. Under CT guidance, a(n) 17 gauge guide needle was advanced into the left lower lobe lung mass. Subsequently, 3 18 gauge core biopsies were obtained. A collagen plug was deployed in the needle tract. The guide needle was removed. Post biopsy images demonstrate no pneumothorax. Patient tolerated the procedure well without complication. Vital sign monitoring by nursing staff during the procedure will continue as patient is in the special procedures unit for post procedure observation. FINDINGS: The images document guide needle placement within the left lower lobe lung mass. Post biopsy images demonstrate no pneumothorax. IMPRESSION: Successful CT-guided core biopsy of a left lower lobe lung mass. Electronically Signed   By: Jolaine Click M.D.   On: 06/18/2018 12:17   Dg Chest  Port 1 View  Result Date: 06/18/2018 CLINICAL DATA:  Status post left lung biopsy EXAM: PORTABLE CHEST 1 VIEW COMPARISON:  CT from earlier in the same day. FINDINGS: Cardiac shadow is enlarged but stable. Left lung base is again identified retrocardiac position. No underlying pneumothorax is seen. Persistent density in the right base stable from the prior CT is noted. No bony abnormality is noted. IMPRESSION: No pneumothorax following lung biopsy on the left. Electronically Signed   By: Alcide Clever M.D.   On: 06/18/2018 13:25     TODAY-DAY OF DISCHARGE:  Subjective:   Gitanjali Mormino today has no headache,no chest abdominal pain,no new weakness tingling or numbness, feels much better wants to go home today.   Objective:   Blood pressure 111/72, pulse 92, temperature 98.6 F (37 C), temperature source Oral, resp. rate 18, height 5\' 2"  (1.575 m), weight 82.5 kg, SpO2 96 %.  Intake/Output Summary (Last 24 hours) at 06/18/2018 1340 Last data filed at 06/17/2018 1352 Gross per 24 hour  Intake 240 ml  Output -  Net 240 ml   Filed Weights   06/16/18 1237  Weight: 82.5 kg    Exam: Awake Alert, Oriented *3, No new F.N deficits, Normal affect [place].AT,PERRAL Supple Neck,No JVD, No cervical lymphadenopathy appriciated.  Symmetrical Chest wall movement, Good air movement bilaterally, CTAB RRR,No Gallops,Rubs or new Murmurs, No Parasternal Heave +ve B.Sounds, Abd Soft, Non tender, No organomegaly appriciated, No rebound -guarding or rigidity. No Cyanosis, Clubbing or edema, No new Rash or bruise   PERTINENT RADIOLOGIC STUDIES: Mr Laqueta Jean Wo Contrast  Result Date: 05/30/2018 CLINICAL DATA:  Lung cancer staging. EXAM: MRI HEAD WITHOUT AND WITH CONTRAST TECHNIQUE: Multiplanar, multiecho pulse sequences of the brain and surrounding structures were obtained without and with intravenous contrast. CONTRAST:  7.5 mL Gadavist COMPARISON:  None. FINDINGS: Brain: Enhancing cerebellar lesions measure 4 mm in  the inferior vermis (series 13, image 16) and 3 mm in the left cerebellar hemisphere (series 13, image 20). An enhancing lesion in the right pons measures 3 mm (series 13, image 26). There are 17 enhancing supratentorial lesions with the largest measuring 6 mm in left frontal lobe (series 13, image 49). There is no associated edema. No acute infarct, intracranial hemorrhage, midline shift, or extra-axial fluid collection is evident. The ventricles and sulci are within normal limits for age. Scattered T2 hyperintensities in the cerebral white matter separate from the enhancing lesions are nonspecific but compatible with mild chronic small vessel ischemic disease. Vascular: Major intracranial vascular flow voids are preserved. Skull and upper cervical spine: Unremarkable bone marrow signal. Sinuses/Orbits: Left cataract extraction. Postsurgical changes and mild mucosal thickening in the paranasal sinuses. Clear mastoid air cells. Other: None. IMPRESSION: 1. Twenty subcentimeter enhancing brain lesions consistent with metastases. No edema. 2. Mild chronic small vessel ischemic disease. Electronically Signed   By: Sebastian Ache M.D.   On: 05/30/2018 12:23   Ct Biopsy  Result Date: 06/18/2018 INDICATION: Left lower lobe hypermetabolic lung mass EXAM: CT BIOPSY MEDICATIONS: None. ANESTHESIA/SEDATION: Fentanyl 100 mcg IV; Versed 2 mg IV Moderate Sedation Time:  13 minutes The patient was continuously monitored during the procedure by the interventional radiology nurse under my direct supervision. FLUOROSCOPY TIME:  None COMPLICATIONS: None immediate. PROCEDURE: Informed written consent was obtained from the patient after a thorough discussion of the procedural risks, benefits and alternatives. All questions were addressed. Maximal Sterile Barrier Technique was utilized including caps, mask, sterile gowns, sterile gloves, sterile drape, hand hygiene and skin antiseptic. A timeout was performed prior to the initiation of  the procedure. Under CT guidance, a(n) 17 gauge guide needle was advanced into the left lower lobe lung mass. Subsequently, 3 18 gauge core biopsies were obtained. A collagen plug was deployed in the needle tract. The guide needle was removed. Post biopsy images demonstrate no pneumothorax. Patient tolerated the procedure well without complication. Vital sign monitoring by nursing staff during the procedure will continue as patient is in the special procedures unit for post procedure observation. FINDINGS: The images document guide needle placement within the left lower lobe lung mass. Post biopsy images demonstrate no pneumothorax. IMPRESSION: Successful CT-guided core biopsy of a left lower lobe lung mass. Electronically Signed   By: Jolaine Click M.D.   On: 06/18/2018 12:17   Dg Chest Port 1 View  Result Date: 06/18/2018 CLINICAL DATA:  Status post left lung biopsy EXAM: PORTABLE CHEST 1 VIEW COMPARISON:  CT from earlier in the same day. FINDINGS: Cardiac shadow is enlarged but stable. Left lung base is  again identified retrocardiac position. No underlying pneumothorax is seen. Persistent density in the right base stable from the prior CT is noted. No bony abnormality is noted. IMPRESSION: No pneumothorax following lung biopsy on the left. Electronically Signed   By: Alcide Clever M.D.   On: 06/18/2018 13:25     PERTINENT LAB RESULTS: CBC: Recent Labs    06/16/18 1527 06/18/18 0438  WBC 5.2 8.1  HGB 12.9 11.7*  HCT 39.1 36.2  PLT 306 278   CMET CMP     Component Value Date/Time   NA 135 06/18/2018 0438   K 3.0 (L) 06/18/2018 0438   CL 99 06/18/2018 0438   CO2 25 06/18/2018 0438   GLUCOSE 114 (H) 06/18/2018 0438   BUN 7 (L) 06/18/2018 0438   CREATININE 0.95 06/18/2018 0438   CALCIUM 9.6 06/18/2018 0438   PROT 7.9 06/16/2018 1527   ALBUMIN 3.9 06/16/2018 1527   AST 19 06/16/2018 1527   ALT 14 06/16/2018 1527   ALKPHOS 100 06/16/2018 1527   BILITOT 0.5 06/16/2018 1527   GFRNONAA  >60 06/18/2018 0438   GFRAA >60 06/18/2018 0438    GFR Estimated Creatinine Clearance: 57.2 mL/min (by C-G formula based on SCr of 0.95 mg/dL). No results for input(s): LIPASE, AMYLASE in the last 72 hours. No results for input(s): CKTOTAL, CKMB, CKMBINDEX, TROPONINI in the last 72 hours. Invalid input(s): POCBNP No results for input(s): DDIMER in the last 72 hours. Recent Labs    06/16/18 1527  HGBA1C 6.1*   No results for input(s): CHOL, HDL, LDLCALC, TRIG, CHOLHDL, LDLDIRECT in the last 72 hours. No results for input(s): TSH, T4TOTAL, T3FREE, THYROIDAB in the last 72 hours.  Invalid input(s): FREET3 No results for input(s): VITAMINB12, FOLATE, FERRITIN, TIBC, IRON, RETICCTPCT in the last 72 hours. Coags: Recent Labs    06/18/18 0438  INR 1.21   Microbiology: No results found for this or any previous visit (from the past 240 hour(s)).  FURTHER DISCHARGE INSTRUCTIONS:  Get Medicines reviewed and adjusted: Please take all your medications with you for your next visit with your Primary MD  Laboratory/radiological data: Please request your Primary MD to go over all hospital tests and procedure/radiological results at the follow up, please ask your Primary MD to get all Hospital records sent to his/her office.  In some cases, they will be blood work, cultures and biopsy results pending at the time of your discharge. Please request that your primary care M.D. goes through all the records of your hospital data and follows up on these results.  Also Note the following: If you experience worsening of your admission symptoms, develop shortness of breath, life threatening emergency, suicidal or homicidal thoughts you must seek medical attention immediately by calling 911 or calling your MD immediately  if symptoms less severe.  You must read complete instructions/literature along with all the possible adverse reactions/side effects for all the Medicines you take and that have been  prescribed to you. Take any new Medicines after you have completely understood and accpet all the possible adverse reactions/side effects.   Do not drive when taking Pain medications or sleeping medications (Benzodaizepines)  Do not take more than prescribed Pain, Sleep and Anxiety Medications. It is not advisable to combine anxiety,sleep and pain medications without talking with your primary care practitioner  Special Instructions: If you have smoked or chewed Tobacco  in the last 2 yrs please stop smoking, stop any regular Alcohol  and or any Recreational drug use.  Wear  Seat belts while driving.  Please note: You were cared for by a hospitalist during your hospital stay. Once you are discharged, your primary care physician will handle any further medical issues. Please note that NO REFILLS for any discharge medications will be authorized once you are discharged, as it is imperative that you return to your primary care physician (or establish a relationship with a primary care physician if you do not have one) for your post hospital discharge needs so that they can reassess your need for medications and monitor your lab values.  Total Time spent coordinating discharge including counseling, education and face to face time equals 25  minutes.  Signed: Codie Krogh 06/18/2018 1:40 PM

## 2018-06-23 DIAGNOSIS — I2699 Other pulmonary embolism without acute cor pulmonale: Secondary | ICD-10-CM | POA: Diagnosis not present

## 2018-06-23 DIAGNOSIS — Z23 Encounter for immunization: Secondary | ICD-10-CM | POA: Diagnosis not present

## 2018-06-23 DIAGNOSIS — C3491 Malignant neoplasm of unspecified part of right bronchus or lung: Secondary | ICD-10-CM | POA: Diagnosis not present

## 2018-06-26 ENCOUNTER — Inpatient Hospital Stay (HOSPITAL_COMMUNITY): Payer: Medicare HMO | Attending: Hematology | Admitting: Hematology

## 2018-06-26 ENCOUNTER — Encounter (HOSPITAL_COMMUNITY): Payer: Self-pay | Admitting: *Deleted

## 2018-06-26 ENCOUNTER — Other Ambulatory Visit: Payer: Self-pay

## 2018-06-26 ENCOUNTER — Encounter (HOSPITAL_COMMUNITY): Payer: Self-pay | Admitting: Hematology

## 2018-06-26 VITALS — BP 146/82 | HR 97 | Temp 98.6°F | Resp 18 | Wt 186.1 lb

## 2018-06-26 DIAGNOSIS — R918 Other nonspecific abnormal finding of lung field: Secondary | ICD-10-CM

## 2018-06-26 DIAGNOSIS — R110 Nausea: Secondary | ICD-10-CM

## 2018-06-26 MED ORDER — PROCHLORPERAZINE MALEATE 10 MG PO TABS: 10.0000 mg | ORAL_TABLET | Freq: Four times a day (QID) | ORAL | 3 refills | Status: DC | PRN

## 2018-06-26 NOTE — Progress Notes (Signed)
I spoke with Amber in pathology and ordered foundation one CDx and PD-L1 on Accession # [id number] Stage VI C34.90 

## 2018-06-26 NOTE — Patient Instructions (Signed)
[facility] Discharge Instructions     Thank you for choosing [facility] to provide your oncology and hematology care.  To afford each patient quality time with our provider, please arrive at least 15 minutes before your scheduled appointment time.   If you have a lab appointment with the Cancer Center please come in thru the  Main Entrance and check in at the main information desk  You need to re-schedule your appointment should you arrive 10 or more minutes late.  We strive to give you quality time with our providers, and arriving late affects you and other patients whose appointments are after yours.  Also, if you no show three or more times for appointments you may be dismissed from the clinic at the providers discretion.     Again, thank you for choosing [facility].  Our hope is that these requests will decrease the amount of time that you wait before being seen by our physicians.       _____________________________________________________________  Should you have questions after your visit to [facility], please contact our office at [phone] between the hours of 8:00 a.m. and 4:30 p.m.  Voicemails left after 4:00 p.m. will not be returned until the following business day.  For prescription refill requests, have your pharmacy contact our office and allow 72 hours.    Cancer Center Support Programs:   > Cancer Support Group  2nd Tuesday of the month 1pm-2pm, Journey Room    

## 2018-06-26 NOTE — Progress Notes (Signed)
Oncology Navigator Note:  Patient was at the office today for new patient appointment. I met with her after visit today to introduce myself and provide information in how I will be involved with her care.  I provided information to her on expectations and timeline of special testing.  I provided patient with my contact information as well as gave her information to give her family members.  She was given the opportunity to ask questions and she voiced her concerns about the unknown.  I gave her time to cry and get out her frustrations. Patient shared that she relies heavily on the [facility] and his guidance and she is of course worried but she has it in His hands.  I encouraged her to continue with her spiritual practices as it seems to bring her some peace.  I offered for the patient to meet with our chaplain and at this time she is okay.  I told patient that I would let her know if testing is delayed in any fashion and make sure we had results before her next appointment.  She verbalizes understanding and appreciation.

## 2018-06-26 NOTE — Progress Notes (Signed)
[facility] [place], [place] [address]   CLINIC:  Medical Oncology/Hematology  PCP:  [name], MD [address] Kaitlin [name] [id number] [phone]   REASON FOR VISIT: Follow-up for adenocarcinoma of the lung  CURRENT THERAPY: work-up    INTERVAL HISTORY:  [name] 68 y.o. female returns for routine follow-up for adenocarcinoma of the lung. She is here with her whole family. She is having trouble swallowing larger pills and meat. She has a cough with clear sputum and mild SOB. She is not eating as well due to her nausea. Denies any vomiting or diarrhea. Denies any new pains. Had not noticed any recent bleeding such as epistaxis, hematuria or hematochezia. Denies recent chest pain on exertion, pre-syncopal episodes, or palpitations. Denies any numbness or tingling in hands or feet. Denies any recent fevers, infections, or recent hospitalizations. Patient reports appetite at 25% and energy level at 75%.    REVIEW OF SYSTEMS:  Review of Systems  Constitutional: Positive for fatigue.  HENT:   Positive for trouble swallowing.   Respiratory: Positive for cough and shortness of breath.   Gastrointestinal: Positive for nausea.  All other systems reviewed and are negative.    PAST MEDICAL/SURGICAL HISTORY:  Past Medical History:  Diagnosis Date  . Asthma   . Carpal tunnel syndrome   . Cataract    left eye  . Diabetes mellitus without complication ([place])   . DVT (deep venous thrombosis) ([place])   . Hypertension   . Pulmonary embolism ([place]) 03/2018   Past Surgical History:  Procedure Laterality Date  . ABDOMINAL HYSTERECTOMY    . biopsy of right breast    . FOOT SURGERY    . implant left eye    . INTRAOCULAR LENS INSERTION       SOCIAL HISTORY:  Social History   Socioeconomic History  . Marital status: Widowed    Spouse name: Not on file  . Number of children: 4  . Years of education: 62  . Highest education level: High school  graduate  Occupational History  . Not on file  Social Needs  . Financial resource strain: Very hard  . Food insecurity:    Worry: Sometimes true    Inability: Sometimes true  . Transportation needs:    Medical: No    Non-medical: No  Tobacco Use  . Smoking status: Never Smoker  . Smokeless tobacco: Never Used  Substance and Sexual Activity  . Alcohol use: Not Currently    Comment: occ  . Drug use: No  . Sexual activity: Yes    Partners: Male    Birth control/protection: Surgical  Lifestyle  . Physical activity:    Days per week: 0 days    Minutes per session: Not on file  . Stress: Only a little  Relationships  . Social connections:    Talks on phone: More than three times a week    Gets together: More than three times a week    Attends religious service: More than 4 times per year    Active member of club or organization: Yes    Attends meetings of clubs or organizations: More than 4 times per year    Relationship status: Widowed  . Intimate partner violence:    Fear of current or ex partner: No    Emotionally abused: No    Physically abused: No    Forced sexual activity: No  Other Topics Concern  . 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    FAMILY HISTORY:  Family History  Problem Relation Age of Onset  . Heart failure Mother   . Stroke Mother   . Heart attack Mother   . Kidney failure Other   . Leukemia Father     CURRENT MEDICATIONS:  Outpatient Encounter Medications as of 06/26/2018  Medication Sig  . amLODipine (NORVASC) 5 MG tablet Take 5 mg by mouth daily.  Marland Kitchen apixaban (ELIQUIS) 5 MG TABS tablet Take 1 tablet (5 mg total) by mouth 2 (two) times daily.  . Blood Glucose Monitoring Suppl (TRUE METRIX METER) DEVI 1 Device by Does not apply route  daily.  . chlorthalidone (HYGROTON) 25 MG tablet Take 25 mg by mouth daily.  . colchicine 0.6 MG tablet Take 1 tablet (0.6 mg total) by mouth daily. (Patient taking differently: Take 0.6 mg by mouth daily as needed (gout flare up). )  . HYDROcodone-homatropine (HYCODAN) 5-1.5 MG/5ML syrup   . losartan (COZAAR) 100 MG tablet Take 1 tablet (100 mg total) by mouth daily.  . pantoprazole (PROTONIX) 40 MG tablet Take 1 tablet (40 mg total) by mouth daily.  Marland Kitchen albuterol (PROVENTIL HFA;VENTOLIN HFA) 108 (90 BASE) MCG/ACT inhaler Inhale 2 puffs into the lungs every 6 (six) hours as needed for wheezing.  Marland Kitchen albuterol (PROVENTIL) (2.5 MG/3ML) 0.083% nebulizer solution Take 3 mLs (2.5 mg total) by nebulization every 6 (six) hours as needed for wheezing or shortness of breath. (Patient not taking: Reported on 06/26/2018)  . LORazepam (ATIVAN) 0.5 MG tablet Take 0.5 mg by mouth at bedtime as needed for sleep.   . [DISCONTINUED] benzonatate (TESSALON) 200 MG capsule Take 1 capsule (200 mg total) by mouth 3 (three) times daily as needed for cough.   No facility-administered encounter medications on file as of 06/26/2018.     ALLERGIES:  Allergies  Allergen Reactions  . Banana Anaphylaxis  . Aspirin Other (See Comments)    G.I. Upset      PHYSICAL EXAM:  ECOG Performance status: 1  Vitals:   06/26/18 0900  BP: (!) 146/82  Pulse: 97  Resp: 18  Temp: 98.6 F (37 C)  SpO2: 100%   Filed Weights   06/26/18 0900  Weight: 186 lb 1 oz (84.4 kg)    Physical Exam Constitutional:      Appearance: Normal appearance. She is normal weight.  Cardiovascular:     Rate and Rhythm: Normal rate and regular rhythm.     Heart sounds: Normal heart sounds.  Pulmonary:     Effort: Pulmonary effort is normal.     Breath sounds: Normal breath sounds.  Musculoskeletal: Normal range of motion.  Skin:    General: Skin is warm and dry.  Neurological:     Mental Status: She is alert and oriented to person, place,  and time. Mental status is at baseline.  Psychiatric:        Mood and Affect: Mood normal.        Behavior: Behavior normal.        Thought Content: Thought content normal.        Judgment: Judgment normal.      LABORATORY DATA:  I have reviewed the labs as listed.  CBC    Component Value  Date/Time   WBC 8.1 06/18/2018 0438   RBC 4.47 06/18/2018 0438   HGB 11.7 (L) 06/18/2018 0438   HCT 36.2 06/18/2018 0438   PLT 278 06/18/2018 0438   MCV 81.0 06/18/2018 0438   MCH 26.2 06/18/2018 0438   MCHC 32.3 06/18/2018 0438   RDW 14.7 06/18/2018 0438   LYMPHSABS 1.3 06/16/2018 1527   MONOABS 0.6 06/16/2018 1527   EOSABS 0.2 06/16/2018 1527   BASOSABS 0.1 06/16/2018 1527   CMP Latest Ref Rng & Units 06/18/2018 06/17/2018 06/16/2018  Glucose 70 - 99 mg/dL 114(H) 106(H) 85  BUN 8 - 23 mg/dL 7(L) 9 8  Creatinine 0.44 - 1.00 mg/dL 0.95 0.85 0.84  Sodium 135 - 145 mmol/L 135 135 136  Potassium 3.5 - 5.1 mmol/L 3.0(L) 3.3(L) 2.9(L)  Chloride 98 - 111 mmol/L 99 101 99  CO2 22 - 32 mmol/L 25 25 26   Calcium 8.9 - 10.3 mg/dL 9.6 9.6 9.9  Total Protein 6.5 - 8.1 g/dL - - 7.9  Total Bilirubin 0.3 - 1.2 mg/dL - - 0.5  Alkaline Phos 38 - 126 U/L - - 100  AST 15 - 41 U/L - - 19  ALT 0 - 44 U/L - - 14       DIAGNOSTIC IMAGING:  I have independently reviewed the scans and discussed with the patient.   I have reviewed Francene Finders, NP's note and agree with the documentation.  I personally performed a face-to-face visit, made revisions and my assessment and plan is as follows.    ASSESSMENT & PLAN:   No problem-specific Assessment & Plan notes found for this encounter.      Orders placed this encounter:  No orders of the defined types were placed in this encounter.     Derek Jack, MD [facility] [phone]

## 2018-07-02 ENCOUNTER — Encounter (HOSPITAL_COMMUNITY): Payer: Self-pay | Admitting: *Deleted

## 2018-07-02 NOTE — Progress Notes (Signed)
I spoke with Rochel at [facility] and gave her the verbal order to cancel the [facility] CDX and only continue with the [facility] testing for PD-L1. Per Dr. Delton Coombes.    Patient was also notified and asked to come in tomorrow for lab draw to be sent off to [place].  She verbalizes understanding.

## 2018-07-03 ENCOUNTER — Telehealth: Payer: Self-pay | Admitting: General Surgery

## 2018-07-03 ENCOUNTER — Encounter: Payer: Self-pay | Admitting: Hematology

## 2018-07-03 ENCOUNTER — Ambulatory Visit (INDEPENDENT_AMBULATORY_CARE_PROVIDER_SITE_OTHER): Payer: Medicare HMO | Admitting: General Surgery

## 2018-07-03 ENCOUNTER — Other Ambulatory Visit (HOSPITAL_COMMUNITY): Payer: Self-pay | Admitting: *Deleted

## 2018-07-03 ENCOUNTER — Encounter: Payer: Self-pay | Admitting: General Surgery

## 2018-07-03 ENCOUNTER — Encounter (HOSPITAL_COMMUNITY): Payer: Self-pay | Admitting: *Deleted

## 2018-07-03 VITALS — BP 130/81 | HR 99 | Temp 97.3°F | Resp 20 | Wt 188.2 lb

## 2018-07-03 DIAGNOSIS — I2699 Other pulmonary embolism without acute cor pulmonale: Secondary | ICD-10-CM

## 2018-07-03 DIAGNOSIS — C3492 Malignant neoplasm of unspecified part of left bronchus or lung: Secondary | ICD-10-CM

## 2018-07-03 DIAGNOSIS — R918 Other nonspecific abnormal finding of lung field: Secondary | ICD-10-CM

## 2018-07-03 MED ORDER — ENOXAPARIN SODIUM 150 MG/ML ~~LOC~~ SOLN: 1.5000 mg/kg | SUBCUTANEOUS | 0 refills | Status: DC

## 2018-07-03 NOTE — Progress Notes (Signed)
[facility] History and Physical  Reason for Referral: Port a catheter placement  Referring Physician:  Dr. Delton Coombes   Chief Complaint    Lung Cancer      Kaitlin [name] is a 68 y.o. female.  HPI: Kaitlin [name] is a very pleasant 68 yo who has a diagnosis of left lung adenocarinoma and a recent PE bilaterally. She was presented in November with SOB and cough and was found to have the PE and a lung mass. She has been on Eliquis and was sent to the hospital for bridging for her biopsy with IR of the lung mass. She says she is doing fair overall. She has been taking her blood thinners. She was seen in the Oncology Department today for labs and there are plans for a repeat biopsy in the future per the documentation and the patient's report.  She has no new symptoms of SOB or chest pain.    Past Medical History:  Diagnosis Date  . Asthma   . Carpal tunnel syndrome   . Cataract    left eye  . Diabetes mellitus without complication ([place])   . DVT (deep venous thrombosis) ([place])   . Hypertension   . Pulmonary embolism ([place]) 03/2018    Past Surgical History:  Procedure Laterality Date  . ABDOMINAL HYSTERECTOMY    . biopsy of right breast    . FOOT SURGERY    . implant left eye    . INTRAOCULAR LENS INSERTION      Family History  Problem Relation Age of Onset  . Heart failure Mother   . Stroke Mother   . Heart attack Mother   . Kidney failure Other   . Leukemia Father     Social History   Tobacco Use  . Smoking status: Never Smoker  . Smokeless tobacco: Never Used  Substance Use Topics  . Alcohol use: Not Currently    Comment: occ  . Drug use: No    Medications: I have reviewed the patient's current medications. Allergies as of 07/03/2018      Reactions   Banana Anaphylaxis   Aspirin Other (See Comments)   G.IMariah Milling       Medication List       Accurate as of July 03, 2018  3:29 PM. Always use your most recent med list.        albuterol (2.5  MG/3ML) 0.083% nebulizer solution Commonly known as:  PROVENTIL Take 3 mLs (2.5 mg total) by nebulization every 6 (six) hours as needed for wheezing or shortness of breath.   albuterol 108 (90 Base) MCG/ACT inhaler Commonly known as:  PROVENTIL HFA;VENTOLIN HFA Inhale 2 puffs into the lungs every 6 (six) hours as needed for wheezing.   amLODipine 5 MG tablet Commonly known as:  NORVASC Take 5 mg by mouth daily.   apixaban 5 MG Tabs tablet Commonly known as:  ELIQUIS Take 1 tablet (5 mg total) by mouth 2 (two) times daily.   chlorthalidone 25 MG tablet Commonly known as:  HYGROTON Take 25 mg by mouth daily.   colchicine 0.6 MG tablet Take 1 tablet (0.6 mg total) by mouth daily.   enoxaparin 150 MG/ML injection Commonly known as:  LOVENOX Inject 0.85 mLs (130 mg total) into the skin daily. Do not take Eliquis on Friday 2/14. Take lovenox shot on Friday 2/14 and Saturday 2/15.   HYDROcodone-homatropine 5-1.5 MG/5ML syrup Commonly known as:  HYCODAN   losartan 100 MG tablet Commonly known as:  COZAAR Take 1 tablet (100 mg total) by mouth daily.   pantoprazole 40 MG tablet Commonly known as:  PROTONIX Take 1 tablet (40 mg total) by mouth daily.   predniSONE 10 MG tablet Commonly known as:  DELTASONE Take 10 mg by mouth daily with breakfast.   prochlorperazine 10 MG tablet Commonly known as:  COMPAZINE Take 1 tablet (10 mg total) by mouth every 6 (six) hours as needed for nausea or vomiting.   TRUE METRIX METER Devi 1 Device by Does not apply route daily.        ROS:  A comprehensive review of systems was negative except for: Cardiovascular: positive for HTN Hematologic/lymphatic: positive for pulmonary embolism  Blood pressure 130/81, pulse 99, temperature (!) 97.3 F (36.3 C), temperature source Temporal, resp. rate 20, weight 188 lb 3.2 oz (85.4 kg). Physical Exam Vitals signs reviewed.  Constitutional:      Appearance: Normal appearance.  HENT:     Head:  Normocephalic.     Nose: Nose normal.     Mouth/Throat:     Mouth: Mucous membranes are moist.  Eyes:     Pupils: Pupils are equal, round, and reactive to light.  Neck:     Musculoskeletal: Normal range of motion.  Cardiovascular:     Rate and Rhythm: Normal rate and regular rhythm.  Pulmonary:     Effort: Pulmonary effort is normal.     Breath sounds: Normal breath sounds.  Abdominal:     General: There is no distension.     Palpations: Abdomen is soft.     Tenderness: There is no abdominal tenderness.  Musculoskeletal: Normal range of motion.        General: No swelling.  Skin:    General: Skin is warm and dry.  Neurological:     General: No focal deficit present.     Mental Status: She is alert and oriented to person, place, and time.  Psychiatric:        Mood and Affect: Mood normal.        Behavior: Behavior normal.        Thought Content: Thought content normal.        Judgment: Judgment normal.     Results: Reviewed CTA chest from 03/2018- clots bilaterally; difficult to see right subclavian due to the contrast load in that arm, left subclavian appears normal   Assessment & Plan:  Kaitlin [name] is a 68 y.o. female with lung cancer who is being scheduled for chemotherapy and radiation in the upcoming weeks. She sees Radiation Oncology Next week. She has been on Eliquis for her PE since November. I have spoke to Dr. Delton Coombes and given the timing of the PE he would want her on a lovenox shot 1.5mg / kg daily leading up to the port and we can hold the day prior to the port.    -Patient to hold Eliquis starting 2/14, she will have a Lovenox shot 2/14 and 2/15, and will hold all anticoagulation on 2/16. -Port to be placed on 2/17   All questions were answered to the satisfaction of the patient.  The risk and benefits of port a catheter placement were discussed including but not limited to bleeding, infection, malfunction, risk of pneumothorax.  After careful consideration,  Kaitlin [name] has decided to proceed.    Virl Cagey 07/03/2018, 3:29 PM

## 2018-07-03 NOTE — Progress Notes (Signed)
Oncology Navigator note:  Patient arrived at clinic today ambulatory and in stable condition. She is here to have labs drawn for guardant 360 testing.  Those labs were drawn with 23g needle from left AC, needle removed intact, gazue and bandaid applied.  Patient tolerated blood draw without incidence.  I spoke with patient about her having a tissue biopsy scheduled outpatient through interventional radiology. She is aware that she would have to stop her Eliquis a few days before and can restart it following the biopsy (those specific dates will be discussed with patient after scheduling).  She verbalizes understanding and agreeable to this. Patient discharged ambulatory from clinic.   Dr. Delton Coombes is okay with patient holding Eliquis for IR to do CT guided biopsy of lung mass.    I spoke with Nicole Kindred in central scheduling and relayed the above information, they have the order in review at this time.

## 2018-07-03 NOTE — H&P (Signed)
[facility] History and Physical  Reason for Referral: Port a catheter placement  Referring Physician:  Dr. Delton Coombes      Chief Complaint    Lung Cancer      Kaitlin [name] is a 68 y.o. female.  HPI: Kaitlin [name] is a very pleasant 68 yo who has a diagnosis of left lung adenocarinoma and a recent PE bilaterally. She was presented in November with SOB and cough and was found to have the PE and a lung mass. She has been on Eliquis and was sent to the hospital for bridging for her biopsy with IR of the lung mass. She says she is doing fair overall. She has been taking her blood thinners. She was seen in the Oncology Department today for labs and there are plans for a repeat biopsy in the future per the documentation and the patient's report.  She has no new symptoms of SOB or chest pain.        Past Medical History:  Diagnosis Date  . Asthma   . Carpal tunnel syndrome   . Cataract    left eye  . Diabetes mellitus without complication ([place])   . DVT (deep venous thrombosis) ([place])   . Hypertension   . Pulmonary embolism ([place]) 03/2018         Past Surgical History:  Procedure Laterality Date  . ABDOMINAL HYSTERECTOMY    . biopsy of right breast    . FOOT SURGERY    . implant left eye    . INTRAOCULAR LENS INSERTION           Family History  Problem Relation Age of Onset  . Heart failure Mother   . Stroke Mother   . Heart attack Mother   . Kidney failure Other   . Leukemia Father     Social History        Tobacco Use  . Smoking status: Never Smoker  . Smokeless tobacco: Never Used  Substance Use Topics  . Alcohol use: Not Currently    Comment: occ  . Drug use: No    Medications: I have reviewed the patient's current medications.      Allergies as of 07/03/2018      Reactions   Banana Anaphylaxis   Aspirin Other (See Comments)   G.IMariah Milling          Medication List       Accurate as of  July 03, 2018  3:29 PM. Always use your most recent med list.        albuterol (2.5 MG/3ML) 0.083% nebulizer solution Commonly known as:  PROVENTIL Take 3 mLs (2.5 mg total) by nebulization every 6 (six) hours as needed for wheezing or shortness of breath.   albuterol 108 (90 Base) MCG/ACT inhaler Commonly known as:  PROVENTIL HFA;VENTOLIN HFA Inhale 2 puffs into the lungs every 6 (six) hours as needed for wheezing.   amLODipine 5 MG tablet Commonly known as:  NORVASC Take 5 mg by mouth daily.   apixaban 5 MG Tabs tablet Commonly known as:  ELIQUIS Take 1 tablet (5 mg total) by mouth 2 (two) times daily.   chlorthalidone 25 MG tablet Commonly known as:  HYGROTON Take 25 mg by mouth daily.   colchicine 0.6 MG tablet Take 1 tablet (0.6 mg total) by mouth daily.   enoxaparin 150 MG/ML injection Commonly known as:  LOVENOX Inject 0.85 mLs (130 mg total) into the skin daily. Do not take Eliquis on Friday  2/14. Take lovenox shot on Friday 2/14 and Saturday 2/15.   HYDROcodone-homatropine 5-1.5 MG/5ML syrup Commonly known as:  HYCODAN   losartan 100 MG tablet Commonly known as:  COZAAR Take 1 tablet (100 mg total) by mouth daily.   pantoprazole 40 MG tablet Commonly known as:  PROTONIX Take 1 tablet (40 mg total) by mouth daily.   predniSONE 10 MG tablet Commonly known as:  DELTASONE Take 10 mg by mouth daily with breakfast.   prochlorperazine 10 MG tablet Commonly known as:  COMPAZINE Take 1 tablet (10 mg total) by mouth every 6 (six) hours as needed for nausea or vomiting.   TRUE METRIX METER Devi 1 Device by Does not apply route daily.        ROS:  A comprehensive review of systems was negative except for: Cardiovascular: positive for HTN Hematologic/lymphatic: positive for pulmonary embolism  Blood pressure 130/81, pulse 99, temperature (!) 97.3 F (36.3 C), temperature source Temporal, resp. rate 20, weight 188 lb 3.2 oz (85.4  kg). Physical Exam Vitals signs reviewed.  Constitutional:      Appearance: Normal appearance.  HENT:     Head: Normocephalic.     Nose: Nose normal.     Mouth/Throat:     Mouth: Mucous membranes are moist.  Eyes:     Pupils: Pupils are equal, round, and reactive to light.  Neck:     Musculoskeletal: Normal range of motion.  Cardiovascular:     Rate and Rhythm: Normal rate and regular rhythm.  Pulmonary:     Effort: Pulmonary effort is normal.     Breath sounds: Normal breath sounds.  Abdominal:     General: There is no distension.     Palpations: Abdomen is soft.     Tenderness: There is no abdominal tenderness.  Musculoskeletal: Normal range of motion.        General: No swelling.  Skin:    General: Skin is warm and dry.  Neurological:     General: No focal deficit present.     Mental Status: She is alert and oriented to person, place, and time.  Psychiatric:        Mood and Affect: Mood normal.        Behavior: Behavior normal.        Thought Content: Thought content normal.        Judgment: Judgment normal.     Results: Reviewed CTA chest from 03/2018- clots bilaterally; difficult to see right subclavian due to the contrast load in that arm, left subclavian appears normal   Assessment & Plan:  Kaitlin [name] is a 68 y.o. female with lung cancer who is being scheduled for chemotherapy and radiation in the upcoming weeks. She sees Radiation Oncology Next week. She has been on Eliquis for her PE since November. I have spoke to Dr. Delton Coombes and given the timing of the PE he would want her on a lovenox shot 1.5mg / kg daily leading up to the port and we can hold the day prior to the port.    -Patient to hold Eliquis starting 2/14, she will have a Lovenox shot 2/14 and 2/15, and will hold all anticoagulation on 2/16. -Port to be placed on 2/17   All questions were answered to the satisfaction of the patient.  The risk and benefits of port a catheter placement were  discussed including but not limited to bleeding, infection, malfunction, risk of pneumothorax.  After careful consideration, Kaitlin [name] has decided to proceed.  Virl Cagey 07/03/2018, 3:29 PM

## 2018-07-03 NOTE — Progress Notes (Signed)
Verbal order received from Dr. Delton Coombes for patient to have IR get tissue sampling for molecular testing.

## 2018-07-03 NOTE — Telephone Encounter (Signed)
Spoke with Dr. Delton Coombes. Patient will need lovenox shot on Friday an Saturday due to the PE being within 3 months. Have called this into [facility].   Curlene Labrum, MD [facility] [address] [place], [place] [address] [phone] (office)

## 2018-07-03 NOTE — Patient Instructions (Signed)
Hold your Eliquis on 2/14 until after your surgery on 07/14/2018.  Implanted [facility] Guide An implanted port is a device that is placed under the skin. It is usually placed in the chest. The device can be used to give IV medicine, to take blood, or for dialysis. You may have an implanted port if:  You need IV medicine that would be irritating to the small veins in your hands or arms.  You need IV medicines, such as antibiotics, for a long period of time.  You need IV nutrition for a long period of time.  You need dialysis. Having a port means that your health care provider will not need to use the veins in your arms for these procedures. You may have fewer limitations when using a port than you would if you used other types of long-term IVs, and you will likely be able to return to normal activities after your incision heals. An implanted port has two main parts:  Reservoir. The reservoir is the part where a needle is inserted to give medicines or draw blood. The reservoir is round. After it is placed, it appears as a small, raised area under your skin.  Catheter. The catheter is a thin, flexible tube that connects the reservoir to a vein. Medicine that is inserted into the reservoir goes into the catheter and then into the vein. How is my port accessed? To access your port:  A numbing cream may be placed on the skin over the port site.  Your health care provider will put on a mask and sterile gloves.  The skin over your port will be cleaned carefully with a germ-killing soap and allowed to dry.  Your health care provider will gently pinch the port and insert a needle into it.  Your health care provider will check for a blood return to make sure the port is in the vein and is not clogged.  If your port needs to remain accessed to get medicine continuously (constant infusion), your health care provider will place a clear bandage (dressing) over the needle site. The dressing and needle  will need to be changed every week, or as told by your health care provider. What is flushing? Flushing helps keep the port from getting clogged. Follow instructions from your health care provider about how and when to flush the port. Ports are usually flushed with saline solution or a medicine called heparin. The need for flushing will depend on how the port is used:  If the port is only used from time to time to give medicines or draw blood, the port may need to be flushed: ? Before and after medicines have been given. ? Before and after blood has been drawn. ? As part of routine maintenance. Flushing may be recommended every 4-6 weeks.  If a constant infusion is running, the port may not need to be flushed.  Throw away any syringes in a disposal container that is meant for sharp items (sharps container). You can buy a sharps container from a pharmacy, or you can make one by using an empty hard plastic bottle with a cover. How long will my port stay implanted? The port can stay in for as long as your health care provider thinks it is needed. When it is time for the port to come out, a surgery will be done to remove it. The surgery will be similar to the procedure that was done to put the port in. Follow these instructions at home:  Flush your port as told by your health care provider.  If you need an infusion over several days, follow instructions from your health care provider about how to take care of your port site. Make sure you: ? Wash your hands with soap and water before you change your dressing. If soap and water are not available, use alcohol-based hand sanitizer. ? Change your dressing as told by your health care provider. ? Place any used dressings or infusion bags into a plastic bag. Throw that bag in the trash. ? Keep the dressing that covers the needle clean and dry. Do not get it wet. ? Do not use scissors or sharp objects near the tube. ? Keep the tube clamped, unless it  is being used.  Check your port site every day for signs of infection. Check for: ? Redness, swelling, or pain. ? Fluid or blood. ? Pus or a bad smell.  Protect the skin around the port site. ? Avoid wearing bra straps that rub or irritate the site. ? Protect the skin around your port from seat belts. Place a soft pad over your chest if needed.  Bathe or shower as told by your health care provider. The site may get wet as long as you are not actively receiving an infusion.  Return to your normal activities as told by your health care provider. Ask your health care provider what activities are safe for you.  Carry a medical alert card or wear a medical alert bracelet at all times. This will let health care providers know that you have an implanted port in case of an emergency. Get help right away if:  You have redness, swelling, or pain at the port site.  You have fluid or blood coming from your port site.  You have pus or a bad smell coming from the port site.  You have a fever. Summary  Implanted ports are usually placed in the chest for long-term IV access.  Follow instructions from your health care provider about flushing the port and changing bandages (dressings).  Take care of the area around your port by avoiding clothing that puts pressure on the area, and by watching for signs of infection.  Protect the skin around your port from seat belts. Place a soft pad over your chest if needed.  Get help right away if you have a fever or you have redness, swelling, pain, drainage, or a bad smell at the port site. This information is not intended to replace advice given to you by your health care provider. Make sure you discuss any questions you have with your health care provider. Document Released: 05/14/2005 Document Revised: 06/16/2016 Document Reviewed: 06/16/2016 Elsevier Interactive Patient Education  2019 [employer].

## 2018-07-04 ENCOUNTER — Other Ambulatory Visit (HOSPITAL_COMMUNITY): Payer: Self-pay | Admitting: *Deleted

## 2018-07-04 ENCOUNTER — Telehealth: Payer: Self-pay | Admitting: Emergency Medicine

## 2018-07-04 MED ORDER — ENOXAPARIN SODIUM 150 MG/ML ~~LOC~~ SOLN: 1.5000 mg/kg | SUBCUTANEOUS | 0 refills | Status: DC

## 2018-07-04 NOTE — Progress Notes (Signed)
Orders received for patient to use lovenox tomorrow and Sunday and not take her eliquis.

## 2018-07-04 NOTE — Telephone Encounter (Signed)
Called and spoke with sara, patients sister and notified her

## 2018-07-06 ENCOUNTER — Emergency Department (HOSPITAL_COMMUNITY): Payer: Medicare HMO

## 2018-07-06 ENCOUNTER — Encounter (HOSPITAL_COMMUNITY): Payer: Self-pay | Admitting: Emergency Medicine

## 2018-07-06 ENCOUNTER — Inpatient Hospital Stay (HOSPITAL_COMMUNITY)
Admission: EM | Admit: 2018-07-06 | Discharge: 2018-07-12 | DRG: 871 | Disposition: A | Discharge status: Home / Self Care | Payer: Medicare HMO | Attending: Internal Medicine | Admitting: Internal Medicine

## 2018-07-06 DIAGNOSIS — C3490 Malignant neoplasm of unspecified part of unspecified bronchus or lung: Secondary | ICD-10-CM | POA: Diagnosis not present

## 2018-07-06 DIAGNOSIS — J45909 Unspecified asthma, uncomplicated: Secondary | ICD-10-CM | POA: Diagnosis present

## 2018-07-06 DIAGNOSIS — I3131 Malignant pericardial effusion in diseases classified elsewhere: Secondary | ICD-10-CM | POA: Diagnosis present

## 2018-07-06 DIAGNOSIS — C770 Secondary and unspecified malignant neoplasm of lymph nodes of head, face and neck: Secondary | ICD-10-CM | POA: Diagnosis not present

## 2018-07-06 DIAGNOSIS — Z806 Family history of leukemia: Secondary | ICD-10-CM | POA: Diagnosis not present

## 2018-07-06 DIAGNOSIS — Z86718 Personal history of other venous thrombosis and embolism: Secondary | ICD-10-CM | POA: Diagnosis not present

## 2018-07-06 DIAGNOSIS — I10 Essential (primary) hypertension: Secondary | ICD-10-CM | POA: Diagnosis not present

## 2018-07-06 DIAGNOSIS — J189 Pneumonia, unspecified organism: Secondary | ICD-10-CM | POA: Diagnosis not present

## 2018-07-06 DIAGNOSIS — C771 Secondary and unspecified malignant neoplasm of intrathoracic lymph nodes: Secondary | ICD-10-CM | POA: Diagnosis present

## 2018-07-06 DIAGNOSIS — C3491 Malignant neoplasm of unspecified part of right bronchus or lung: Secondary | ICD-10-CM | POA: Diagnosis present

## 2018-07-06 DIAGNOSIS — C3492 Malignant neoplasm of unspecified part of left bronchus or lung: Secondary | ICD-10-CM | POA: Diagnosis not present

## 2018-07-06 DIAGNOSIS — Z886 Allergy status to analgesic agent status: Secondary | ICD-10-CM | POA: Diagnosis not present

## 2018-07-06 DIAGNOSIS — I313 Pericardial effusion (noninflammatory): Secondary | ICD-10-CM | POA: Diagnosis present

## 2018-07-06 DIAGNOSIS — E119 Type 2 diabetes mellitus without complications: Secondary | ICD-10-CM | POA: Diagnosis present

## 2018-07-06 DIAGNOSIS — I2699 Other pulmonary embolism without acute cor pulmonale: Secondary | ICD-10-CM | POA: Diagnosis present

## 2018-07-06 DIAGNOSIS — R221 Localized swelling, mass and lump, neck: Secondary | ICD-10-CM | POA: Diagnosis not present

## 2018-07-06 DIAGNOSIS — C7931 Secondary malignant neoplasm of brain: Secondary | ICD-10-CM | POA: Diagnosis not present

## 2018-07-06 DIAGNOSIS — C801 Malignant (primary) neoplasm, unspecified: Secondary | ICD-10-CM | POA: Diagnosis present

## 2018-07-06 DIAGNOSIS — J181 Lobar pneumonia, unspecified organism: Secondary | ICD-10-CM | POA: Diagnosis present

## 2018-07-06 DIAGNOSIS — Z8249 Family history of ischemic heart disease and other diseases of the circulatory system: Secondary | ICD-10-CM | POA: Diagnosis not present

## 2018-07-06 DIAGNOSIS — I311 Chronic constrictive pericarditis: Secondary | ICD-10-CM | POA: Diagnosis not present

## 2018-07-06 DIAGNOSIS — C3432 Malignant neoplasm of lower lobe, left bronchus or lung: Secondary | ICD-10-CM | POA: Diagnosis not present

## 2018-07-06 DIAGNOSIS — Z86711 Personal history of pulmonary embolism: Secondary | ICD-10-CM

## 2018-07-06 DIAGNOSIS — E876 Hypokalemia: Secondary | ICD-10-CM | POA: Diagnosis present

## 2018-07-06 DIAGNOSIS — Z823 Family history of stroke: Secondary | ICD-10-CM

## 2018-07-06 DIAGNOSIS — Z7901 Long term (current) use of anticoagulants: Secondary | ICD-10-CM | POA: Diagnosis not present

## 2018-07-06 DIAGNOSIS — A419 Sepsis, unspecified organism: Principal | ICD-10-CM | POA: Diagnosis present

## 2018-07-06 DIAGNOSIS — Z91018 Allergy to other foods: Secondary | ICD-10-CM | POA: Diagnosis not present

## 2018-07-06 DIAGNOSIS — Y95 Nosocomial condition: Secondary | ICD-10-CM | POA: Diagnosis present

## 2018-07-06 DIAGNOSIS — R591 Generalized enlarged lymph nodes: Secondary | ICD-10-CM

## 2018-07-06 DIAGNOSIS — R599 Enlarged lymph nodes, unspecified: Secondary | ICD-10-CM

## 2018-07-06 DIAGNOSIS — R0602 Shortness of breath: Secondary | ICD-10-CM | POA: Diagnosis not present

## 2018-07-06 DIAGNOSIS — R918 Other nonspecific abnormal finding of lung field: Secondary | ICD-10-CM | POA: Diagnosis not present

## 2018-07-06 LAB — MAGNESIUM: Magnesium: 1.4 mg/dL — ABNORMAL LOW (ref 1.7–2.4)

## 2018-07-06 LAB — URINALYSIS, ROUTINE W REFLEX MICROSCOPIC
Bilirubin Urine: NEGATIVE
Glucose, UA: NEGATIVE mg/dL
Ketones, ur: NEGATIVE mg/dL
Nitrite: NEGATIVE
Protein, ur: NEGATIVE mg/dL
Specific Gravity, Urine: 1.012 (ref 1.005–1.030)
pH: 6 (ref 5.0–8.0)

## 2018-07-06 LAB — CBC WITH DIFFERENTIAL/PLATELET
Abs Immature Granulocytes: 0.14 10*3/uL — ABNORMAL HIGH (ref 0.00–0.07)
Basophils Absolute: 0.1 10*3/uL (ref 0.0–0.1)
Basophils Relative: 0 %
Eosinophils Absolute: 0 10*3/uL (ref 0.0–0.5)
Eosinophils Relative: 0 %
HCT: 39.7 % (ref 36.0–46.0)
Hemoglobin: 12.7 g/dL (ref 12.0–15.0)
Immature Granulocytes: 1 %
Lymphocytes Relative: 6 %
Lymphs Abs: 1.1 10*3/uL (ref 0.7–4.0)
MCH: 25.9 pg — ABNORMAL LOW (ref 26.0–34.0)
MCHC: 32 g/dL (ref 30.0–36.0)
MCV: 81 fL (ref 80.0–100.0)
MONO ABS: 2.2 10*3/uL — AB (ref 0.1–1.0)
Monocytes Relative: 12 %
Neutro Abs: 14.7 10*3/uL — ABNORMAL HIGH (ref 1.7–7.7)
Neutrophils Relative %: 81 %
Platelets: 243 10*3/uL (ref 150–400)
RBC: 4.9 MIL/uL (ref 3.87–5.11)
RDW: 15.1 % (ref 11.5–15.5)
WBC: 18.2 10*3/uL — ABNORMAL HIGH (ref 4.0–10.5)
nRBC: 0 % (ref 0.0–0.2)

## 2018-07-06 LAB — COMPREHENSIVE METABOLIC PANEL
ALT: 15 U/L (ref 0–44)
AST: 18 U/L (ref 15–41)
Albumin: 3.6 g/dL (ref 3.5–5.0)
Alkaline Phosphatase: 85 U/L (ref 38–126)
Anion gap: 16 — ABNORMAL HIGH (ref 5–15)
BUN: 12 mg/dL (ref 8–23)
CO2: 22 mmol/L (ref 22–32)
Calcium: 9.6 mg/dL (ref 8.9–10.3)
Chloride: 94 mmol/L — ABNORMAL LOW (ref 98–111)
Creatinine, Ser: 1.07 mg/dL — ABNORMAL HIGH (ref 0.44–1.00)
GFR calc Af Amer: >60 mL/min (ref >60)
GFR calc non Af Amer: 54 mL/min — ABNORMAL LOW (ref >60)
Glucose, Bld: 163 mg/dL — ABNORMAL HIGH (ref 70–99)
Potassium: 3 mmol/L — ABNORMAL LOW (ref 3.5–5.1)
SODIUM: 132 mmol/L — AB (ref 135–145)
Total Bilirubin: 0.9 mg/dL (ref 0.3–1.2)
Total Protein: 7.6 g/dL (ref 6.5–8.1)

## 2018-07-06 LAB — HEMOGLOBIN A1C
Hgb A1c MFr Bld: 6.5 % — ABNORMAL HIGH (ref 4.8–5.6)
Mean Plasma Glucose: 139.85 mg/dL

## 2018-07-06 LAB — STREP PNEUMONIAE URINARY ANTIGEN: Strep Pneumo Urinary Antigen: NEGATIVE

## 2018-07-06 LAB — LACTIC ACID, PLASMA
Lactic Acid, Venous: 1.4 mmol/L (ref 0.5–1.9)
Lactic Acid, Venous: 1.4 mmol/L (ref 0.5–1.9)

## 2018-07-06 LAB — TROPONIN I: Troponin I: <0.03 ng/mL (ref <0.03)

## 2018-07-06 LAB — GLUCOSE, CAPILLARY: Glucose-Capillary: 127 mg/dL — ABNORMAL HIGH (ref 70–99)

## 2018-07-06 LAB — BRAIN NATRIURETIC PEPTIDE: B Natriuretic Peptide: 39.7 pg/mL (ref 0.0–100.0)

## 2018-07-06 MED ORDER — POTASSIUM CHLORIDE 20 MEQ/15ML (10%) PO SOLN
40.0000 meq | Freq: Once | ORAL | Status: AC
  Administered 2018-07-06: 40 meq via ORAL
  Filled 2018-07-06: qty 30

## 2018-07-06 MED ORDER — SODIUM CHLORIDE 0.9 % IV SOLN
250.0000 mL | INTRAVENOUS | Status: DC | PRN
  Administered 2018-07-11: 250 mL via INTRAVENOUS

## 2018-07-06 MED ORDER — INSULIN ASPART 100 UNIT/ML ~~LOC~~ SOLN: 0.0000 [IU] | Freq: Every day | SUBCUTANEOUS | Status: DC

## 2018-07-06 MED ORDER — INSULIN ASPART 100 UNIT/ML ~~LOC~~ SOLN
6.0000 [IU] | Freq: Three times a day (TID) | SUBCUTANEOUS | Status: DC
  Administered 2018-07-07 – 2018-07-12 (×8): 6 [IU] via SUBCUTANEOUS

## 2018-07-06 MED ORDER — POTASSIUM CHLORIDE CRYS ER 20 MEQ PO TBCR
40.0000 meq | EXTENDED_RELEASE_TABLET | Freq: Once | ORAL | Status: DC
  Filled 2018-07-06: qty 2

## 2018-07-06 MED ORDER — AMLODIPINE BESYLATE 5 MG PO TABS
5.0000 mg | ORAL_TABLET | Freq: Every day | ORAL | Status: DC
  Administered 2018-07-06 – 2018-07-08 (×3): 5 mg via ORAL
  Filled 2018-07-06 (×3): qty 1

## 2018-07-06 MED ORDER — LORAZEPAM 1 MG PO TABS: 0.5000 mg | ORAL_TABLET | Freq: Every evening | ORAL | Status: DC | PRN

## 2018-07-06 MED ORDER — VANCOMYCIN HCL IN DEXTROSE 1-5 GM/200ML-% IV SOLN: 1000.0000 mg | Freq: Once | INTRAVENOUS | Status: DC

## 2018-07-06 MED ORDER — PANTOPRAZOLE SODIUM 40 MG PO TBEC
40.0000 mg | DELAYED_RELEASE_TABLET | Freq: Every day | ORAL | Status: DC
  Administered 2018-07-06 – 2018-07-12 (×7): 40 mg via ORAL
  Filled 2018-07-06 (×7): qty 1

## 2018-07-06 MED ORDER — SODIUM CHLORIDE 0.9% FLUSH
3.0000 mL | Freq: Two times a day (BID) | INTRAVENOUS | Status: DC
  Administered 2018-07-06 – 2018-07-12 (×7): 3 mL via INTRAVENOUS

## 2018-07-06 MED ORDER — PREDNISONE 10 MG (21) PO TBPK
10.0000 mg | ORAL_TABLET | Freq: Three times a day (TID) | ORAL | Status: DC
  Administered 2018-07-07 (×2): 10 mg via ORAL
  Filled 2018-07-06: qty 21

## 2018-07-06 MED ORDER — PREDNISONE 10 MG (21) PO TBPK: 10.0000 mg | ORAL_TABLET | Freq: Four times a day (QID) | ORAL | Status: DC

## 2018-07-06 MED ORDER — VANCOMYCIN HCL IN DEXTROSE 1-5 GM/200ML-% IV SOLN
1000.0000 mg | INTRAVENOUS | Status: DC
  Administered 2018-07-07 – 2018-07-08 (×2): 1000 mg via INTRAVENOUS
  Filled 2018-07-06 (×2): qty 200

## 2018-07-06 MED ORDER — PREDNISONE 10 MG (21) PO TBPK
20.0000 mg | ORAL_TABLET | Freq: Every morning | ORAL | Status: AC
  Administered 2018-07-06: 20 mg via ORAL
  Filled 2018-07-06: qty 21

## 2018-07-06 MED ORDER — ENOXAPARIN SODIUM 150 MG/ML ~~LOC~~ SOLN
1.5000 mg/kg | SUBCUTANEOUS | Status: DC
  Administered 2018-07-06: 130 mg via SUBCUTANEOUS
  Filled 2018-07-06 (×2): qty 0.85

## 2018-07-06 MED ORDER — INSULIN ASPART 100 UNIT/ML ~~LOC~~ SOLN
0.0000 [IU] | Freq: Three times a day (TID) | SUBCUTANEOUS | Status: DC
  Administered 2018-07-07: 3 [IU] via SUBCUTANEOUS
  Administered 2018-07-07: 4 [IU] via SUBCUTANEOUS
  Administered 2018-07-08 – 2018-07-09 (×2): 3 [IU] via SUBCUTANEOUS
  Administered 2018-07-10: 4 [IU] via SUBCUTANEOUS
  Administered 2018-07-10: 3 [IU] via SUBCUTANEOUS

## 2018-07-06 MED ORDER — PREDNISONE 10 MG (21) PO TBPK: 20.0000 mg | ORAL_TABLET | Freq: Every evening | ORAL | Status: DC

## 2018-07-06 MED ORDER — LOSARTAN POTASSIUM 50 MG PO TABS
100.0000 mg | ORAL_TABLET | Freq: Every day | ORAL | Status: DC
  Administered 2018-07-06 – 2018-07-12 (×7): 100 mg via ORAL
  Filled 2018-07-06 (×7): qty 2

## 2018-07-06 MED ORDER — PREDNISONE 10 MG (21) PO TBPK
20.0000 mg | ORAL_TABLET | Freq: Every evening | ORAL | Status: AC
  Administered 2018-07-06: 20 mg via ORAL

## 2018-07-06 MED ORDER — ALBUTEROL SULFATE (2.5 MG/3ML) 0.083% IN NEBU: 2.5000 mg | INHALATION_SOLUTION | Freq: Four times a day (QID) | RESPIRATORY_TRACT | Status: DC | PRN

## 2018-07-06 MED ORDER — VANCOMYCIN HCL 10 G IV SOLR
1750.0000 mg | INTRAVENOUS | Status: AC
  Administered 2018-07-06: 1750 mg via INTRAVENOUS
  Filled 2018-07-06: qty 1750

## 2018-07-06 MED ORDER — CHLORTHALIDONE 25 MG PO TABS
25.0000 mg | ORAL_TABLET | Freq: Every day | ORAL | Status: DC
  Administered 2018-07-06 – 2018-07-10 (×5): 25 mg via ORAL
  Filled 2018-07-06 (×6): qty 1

## 2018-07-06 MED ORDER — PREDNISONE 10 MG (21) PO TBPK
10.0000 mg | ORAL_TABLET | ORAL | Status: AC
  Administered 2018-07-06: 10 mg via ORAL

## 2018-07-06 MED ORDER — SODIUM CHLORIDE 0.9 % IV SOLN
2.0000 g | Freq: Once | INTRAVENOUS | Status: AC
  Administered 2018-07-06: 2 g via INTRAVENOUS
  Filled 2018-07-06: qty 2

## 2018-07-06 MED ORDER — HYDROCODONE-HOMATROPINE 5-1.5 MG/5ML PO SYRP: 5.0000 mL | ORAL_SOLUTION | ORAL | Status: DC | PRN

## 2018-07-06 MED ORDER — LACTATED RINGERS IV BOLUS
1000.0000 mL | Freq: Once | INTRAVENOUS | Status: AC
  Administered 2018-07-06: 1000 mL via INTRAVENOUS

## 2018-07-06 MED ORDER — ACETAMINOPHEN 325 MG PO TABS
650.0000 mg | ORAL_TABLET | Freq: Once | ORAL | Status: AC
  Administered 2018-07-06: 650 mg via ORAL
  Filled 2018-07-06: qty 2

## 2018-07-06 MED ORDER — SODIUM CHLORIDE 0.9% FLUSH: 3.0000 mL | INTRAVENOUS | Status: DC | PRN

## 2018-07-06 MED ORDER — SODIUM CHLORIDE 0.9 % IV SOLN
2.0000 g | Freq: Two times a day (BID) | INTRAVENOUS | Status: DC
  Administered 2018-07-07 – 2018-07-11 (×9): 2 g via INTRAVENOUS
  Filled 2018-07-06 (×10): qty 2

## 2018-07-06 NOTE — Progress Notes (Signed)
Kaitlin [name] is a 68 y.o. female patient admitted from ED awake, alert - oriented  X 4 - no acute distress noted.  VSS - Blood pressure 117/68, pulse 98, temperature 99.8 F (37.7 C), resp. rate (!) 24, height 5\' 6"  (1.676 m), weight 86.1 kg, SpO2 98 %.    IV in place, occlusive dsg intact without redness.  Orientation to room, and floor completed. Admission INP armband ID verified with patient/family and in place.   Fall assessment complete, with patient and family able to verbalize understanding of risk associated with falls, and verbalized understanding to call nsg before up out of bed.  Call light within reach, patient able to voice, and demonstrate understanding.  Skin, clean-dry- intact without evidence of bruising, or skin tears.   No evidence of skin break down noted on exam.     Will cont to eval and treat per MD orders.  Patience Musca, RN 07/06/2018 6:49 PM

## 2018-07-06 NOTE — Progress Notes (Signed)
Pharmacy Antibiotic Note  Kaitlin [name] is a 68 y.o. female admitted on 07/06/2018 with PNA.  Pharmacy has been consulted for Vanco/Cefepime dosing.  CC/HPI: Worsening SOB, tachy, cough, back pain  PMH: lung cancer, DVT,  B PE's 11/19, asthma, carpal tunnel, DM, HTN, insomnia, anxiety,   ID: PNA. Temp 100.5.WBC 18.2. Scr 1.07  Vanco 2/9>> Cefepime 2/9>>  Vancomycin 1000 mg IV Q 24 hrs. Goal AUC 400-550. Expected AUC: 519 SCr used: 1.07   Plan: Vanco 1750mg  IV x 1 in ED, then 1g IV q 24h. Levels at steady state after 3-5 doses Cefepime 2g IV x 1 then q 12 hrs F/u anticoagulation orders     Height: 5\' 6"  (167.6 cm) Weight: 193 lb (87.5 kg) IBW/kg (Calculated) : 59.3  Temp (24hrs), Avg:100.5 F (38.1 C), Min:100.5 F (38.1 C), Max:100.5 F (38.1 C)  Recent Labs  Lab 07/06/18 1239  WBC 18.2*  CREATININE 1.07*    Estimated Creatinine Clearance: 56.9 mL/min (A) (by C-G formula based on SCr of 1.07 mg/dL (H)).    Allergies  Allergen Reactions  . Banana Anaphylaxis  . Aspirin Other (See Comments)    G.I. Upset    Kaitlin [name] S. Alford Highland, PharmD, BCPS Clinical Staff Pharmacist  Kaitlin [name] 07/06/2018 1:44 PM

## 2018-07-06 NOTE — ED Triage Notes (Addendum)
Pt in from home with c/o worsening sob since last night. Hx of Lung CA and family reports bilateral PE's since November. Is taking Lovenox and Xarelto. Also reporting recent fevers, is tachycardic 120's, endorses cough. C/o thoracic back pain

## 2018-07-06 NOTE — H&P (Signed)
History and Physical    Kaitlin [name]:[id number] DOB: 1951/01/16 DOA: 07/06/2018  PCP: [name], MD   Patient coming from: Home  I have personally briefly reviewed patient's old medical records in [facility]  Chief Complaint: As of breath and tachycardia  HPI: Kaitlin [name] is a 68 y.o. female with medical history significant of adenocarcinoma of the lung, pulmonary embolism during treatment with long-term anticoagulant therapy, and diabetes mellitus who presents to the hospital 2-1/2 weeks after discharge with complaints of worsening shortness of breath.  Is had a worsening cough and dyspnea and has been fearing overall very poorly since last night about midnight.  The daughter notes that she has developed worsening shortness of breath she tried a breathing treatment with no relief.  The patient has not had any leg swelling.  She is being transitioned off of p.o. anticoagulants to Lovenox so that she can have a biopsy of her lung.  Family is concerned that her dyspnea is consistent with her prior blood clot.  She has had nausea but no vomiting.  ED Course: Patient febrile and treated for pneumonia and mild sepsis.  She was given broad-spectrum antibiotics.  Mild hypokalemia was repleted orally.  Lactate is okay but elevated white cell count tachycardia and dyspnea consistent with mild sepsis criteria.  Is had PE in the past but is currently anticoagulated with Lovenox.  [place] and I discussed lack of benefit to getting a CT angios at this point since she is already appropriately anticoagulated.  Shows no sign of heart strain.  Review of Systems: As per HPI otherwise all other systems reviewed and  negative.    Past Medical History:  Diagnosis Date  . Asthma   . Carpal tunnel syndrome   . Cataract    left eye  . Diabetes mellitus without complication (HCC)   . DVT (deep venous thrombosis) (HCC)   . Hypertension   . Pulmonary embolism (HCC) 03/2018    Past Surgical History:   Procedure Laterality Date  . ABDOMINAL HYSTERECTOMY    . biopsy of right breast    . FOOT SURGERY    . implant left eye    . 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     reports that she has never smoked. She has never used smokeless tobacco. She reports previous alcohol use. She reports that she does not use drugs.  Allergies  Allergen Reactions  . Banana Anaphylaxis  . Aspirin Other (See Comments)    G.IDorena [name]     Family History  Problem Relation Age of Onset  . Heart failure Mother   . Stroke Mother   . Heart attack Mother   . Kidney failure Other   . Leukemia Father      Prior to Admission medications   Medication Sig Start Date End Date Taking? Authorizing Provider  albuterol (PROVENTIL HFA;VENTOLIN HFA) 108 (90 BASE) MCG/ACT inhaler Inhale 2 puffs into the lungs every 6 (six) hours as needed for wheezing.   Yes [provider]  albuterol (PROVENTIL) (2.5 MG/3ML) 0.083% nebulizer solution Take 3 mLs (2.5 mg total) by nebulization every 6 (six) hours as needed for wheezing or shortness of breath.  04/29/18  Yes Gherghe, Daylene Katayama, MD  amLODipine (NORVASC) 5 MG tablet Take 5 mg by mouth daily.   Yes [provider]  apixaban (ELIQUIS) 5 MG TABS tablet Take 1 tablet (5 mg total) by mouth 2 (two) times daily. 06/19/18 07/19/22 Yes Ghimire, Werner Lean, MD  chlorthalidone (HYGROTON) 25 MG tablet Take 25 mg by mouth daily.   Yes [provider]  enoxaparin (LOVENOX) 150 MG/ML injection Inject 0.85 mLs (130 mg total) into the skin daily. Do not take Eliquis on Friday 2/7. Take lovenox shot on Friday 2/08 and Saturday 2/09. 07/04/18  Yes Doreatha Massed, MD  HYDROcodone-homatropine [facility]) 5-1.5 MG/5ML syrup  Take 5 mLs by mouth every 4 (four) hours as needed for cough.  06/20/18  Yes [provider]  LORazepam (ATIVAN) 0.5 MG tablet Take 0.5 mg by mouth at bedtime as needed for anxiety.   Yes [provider]  losartan (COZAAR) 100 MG tablet Take 1 tablet (100 mg total) by mouth daily. 02/11/15  Yes Devoria Albe, MD  pantoprazole (PROTONIX) 40 MG tablet Take 1 tablet (40 mg total) by mouth daily. 06/18/18  Yes Ghimire, Werner Lean, MD  predniSONE (DELTASONE) 10 MG tablet Take 10 mg by mouth daily with breakfast.   Yes [provider]  prochlorperazine (COMPAZINE) 10 MG tablet Take 1 tablet (10 mg total) by mouth every 6 (six) hours as needed for nausea or vomiting. 06/26/18  Yes Lockamy, Randi L, NP-C  Blood Glucose Monitoring Suppl (TRUE METRIX METER) DEVI 1 Device by Does not apply route daily. 04/29/18   Leatha Gilding, MD    Physical Exam:  Constitutional: Ill-appearing, short of breath, and uncomfortable using oxygen. Vitals:   07/06/18 1615 07/06/18 1645 07/06/18 1715 07/06/18 1730  BP: 106/66 108/70 122/75 (!) 143/78  Pulse: (!) 104 (!) 103 (!) 102 (!) 111  Resp: (!) 34 (!) 30 (!) 36 (!) 28  Temp:      TempSrc:      SpO2: 94% 95% 95% 94%  Weight:      Height:       Eyes: PERRL, lids and conjunctivae normal ENMT: Mucous membranes are moist. Posterior pharynx clear of any exudate or lesions.Normal dentition.  Neck: normal, supple, no masses, no thyromegaly Respiratory: Tachypneic with decreased breath sounds and rhonchi throughout. Cardiovascular: Regular rate and rhythm, no murmurs / rubs / gallops. No extremity edema. 2+ pedal pulses. No carotid bruits.  Abdomen: no tenderness, no masses palpated. No hepatosplenomegaly. Bowel sounds positive.  Musculoskeletal: no clubbing / cyanosis. No joint deformity upper and lower extremities. Good ROM, no contractures. Normal muscle tone.  Skin: no rashes, lesions, ulcers. No induration Neurologic: CN 2-12 grossly intact.  Sensation intact, DTR normal. Strength 5/5 in all 4.  Psychiatric: Normal judgment and insight. Alert and oriented x 3. Normal mood.    Labs on Admission: I have personally reviewed following labs and imaging studies  CBC: Recent Labs  Lab 07/06/18 1239  WBC 18.2*  NEUTROABS 14.7*  HGB 12.7  HCT 39.7  MCV 81.0  PLT 243   Basic Metabolic Panel: Recent Labs  Lab 07/06/18 1239  NA 132*  K 3.0*  CL 94*  CO2 22  GLUCOSE 163*  BUN 12  CREATININE 1.07*  CALCIUM 9.6  MG 1.4*   GFR: Estimated Creatinine Clearance: 56.9 mL/min (A) (by C-G formula based on SCr of 1.07 mg/dL (H)). Liver Function Tests: Recent Labs  Lab 07/06/18 1239  AST 18  ALT 15  ALKPHOS  85  BILITOT 0.9  PROT 7.6  ALBUMIN 3.6   Cardiac Enzymes: Recent Labs  Lab 07/06/18 1239  TROPONINI <0.03   Urine analysis:    Component Value Date/Time   COLORURINE YELLOW 07/06/2018 1555   APPEARANCEUR CLEAR 07/06/2018 1555   LABSPEC 1.012 07/06/2018 1555   PHURINE 6.0 07/06/2018 1555   GLUCOSEU NEGATIVE 07/06/2018 1555   HGBUR MODERATE (A) 07/06/2018 1555   BILIRUBINUR NEGATIVE 07/06/2018 1555   KETONESUR NEGATIVE 07/06/2018 1555   PROTEINUR NEGATIVE 07/06/2018 1555   UROBILINOGEN 0.2 04/11/2007 1936   NITRITE NEGATIVE 07/06/2018 1555   LEUKOCYTESUR MODERATE (A) 07/06/2018 1555    Radiological Exams on Admission: Dg Chest 2 View  Result Date: 07/06/2018 CLINICAL DATA:  Worsening shortness of breath since last night. Lung cancer. EXAM: CHEST - 2 VIEW COMPARISON:  06/18/2018 PET-CT from 05/14/2018 FINDINGS: Continued right lower lobe airspace opacity. New left lower lobe airspace opacity. Patient has a known posterior mass on the left side. Low lung volumes are present, causing crowding of the pulmonary vasculature. Moderate enlargement of the cardiopericardial silhouette, at least some of which has been shown to be due to pericardial effusion on prior exams. Thoracic spondylosis.  Mild thickening along  the minor fissure. IMPRESSION: 1. New airspace opacity in the left lower lobe. Although potentially from pneumonia, if the patient is receiving radiation therapy to the known left lower lobe mass then radiation pneumonitis would be a differential diagnostic consideration. 2. Stable airspace opacity in the right lower lobe. 3. Stable enlargement of the cardiopericardial silhouette, previous exams showed a component of pericardial effusion. Electronically Signed   By: Gaylyn Rong M.D.   On: 07/06/2018 13:50    EKG: Independently reviewed.  Sinus tachycardia with left atrial enlargement inferior infarct which appears old no change when compared to prior  Assessment/Plan Principal Problem:   Sepsis (HCC) Active Problems:   HCAP (healthcare-associated pneumonia)   Adenocarcinoma of lung, right (HCC)   Diabetes mellitus without complication (HCC)   Pulmonary embolism during treatment with long-term anticoagulation therapy (HCC)   Hypertension   1.  Sepsis due to Healthcare associated pneumonia: Present on admission.  Patient is started on cefepime and vancomycin.  Chest x-ray consistent with left lower lobe pneumonia.  Will monitor response to treatment by repeat chest x-ray and hopefully decrease oxygen requirements.  She has received fluid in the emergency department.  2.  Adenocarcinoma of the right lung: Patient did have a lung biopsy but apparently needs more tissue for better determination of appropriate treatment.  She was scheduled for a biopsy later this week.  Recommend contacting oncology and notifying them of patient's admission in a.m.  3.  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  Use diabetes sliding scale per protocol.  4.  Pulmonary embolism during treatment with long-term anticoagulation therapy: This occurred last admission.  Patient still anticoagulated.  She is currently on Lovenox.  I do not think repeat CT scanning would be of  benefit at this point as she shows no signs of right-sided heart failure pulmonary strain.  5.  Hypertension: Continue home medications.  DVT prophylaxis: Full dose Lovenox Code Status: Full code Family Communication: sPoke with patient and HER-2 daughters were present at the time of admission.  Patient retains capacity. Disposition Plan: Likely home in 3 to 4 days Consults called: None Admission status: Inpatient   Lahoma Crocker MD FACP [facility] Pager [phone]  How to contact the [facility] Attending or Consulting provider  7A - 7P or covering provider during after hours 7P -7A, for this patient?  1. Check the care team in [facility] and look for a) attending/consulting [facility] provider listed and b) the [facility] team listed 2. Log into [email or web address] and use [place]'s universal password to access. If you do not have the password, please contact the hospital operator. 3. Locate the [facility] provider you are looking for under [facility] and page to a number that you can be directly reached. 4. If you still have difficulty reaching the provider, please page the [facility] (Director on Call) for the Hospitalists listed on amion for assistance.  If 7PM-7AM, please contact night-coverage [email or web address] Password [facility]  07/06/2018, 5:43 PM

## 2018-07-06 NOTE — ED Provider Notes (Signed)
[facility] DEPARTMENT Provider Note   CSN: [id number] Arrival date & time: 07/06/18  1212     History   Chief Complaint Chief Complaint  Patient presents with  . Shortness of Breath  . Tachycardia    HPI Kaitlin [name] is a 68 y.o. female.  HPI  68 year old female presents with shortness of breath.  She has had a cough since she was admitted with lung cancer and PE and discharge a couple weeks ago.  She has had a worsening of this cough, dyspnea and overall feeling poorly since about midnight last night.  Daughter notes she was by short of breath last night as well.  Tried a breathing treatment with no relief.  The patient has not had any leg swelling.  She is currently being transitioned off of p.o. anticoagulants to Lovenox so she can have a biopsy of her lung.  Daughter feels like this dyspnea and presentation is similar to the prior blood clot. Nausea but no vomiting.  Past Medical History:  Diagnosis Date  . Asthma   . Carpal tunnel syndrome   . Cataract    left eye  . Diabetes mellitus without complication ([place])   . DVT (deep venous thrombosis) ([place])   . Hypertension   . Pulmonary embolism ([place]) 03/2018    Patient Active Problem List   Diagnosis Date Noted  . HCAP (healthcare-associated pneumonia) 07/06/2018  . Adenocarcinoma of lung, right ([place]) 07/06/2018  . Adenocarcinoma, lung, left ([place]) 07/03/2018  . Asthma 06/16/2018  . Hypertension 06/16/2018  . Diabetes mellitus without complication ([place]) 53/97/6734  . DVT (deep venous thrombosis) ([place]) 06/16/2018  . Pulmonary embolism during treatment with long-term anticoagulation therapy ([place]) 06/16/2018  . Dysphagia 05/05/2018  . Sepsis with acute respiratory failure without septic shock ([place])   . Lung mass   . Pulmonary emboli ([place]) 04/24/2018  . Routine screening for STI (sexually transmitted infection) 03/14/2016    Past Surgical History:  Procedure Laterality Date  . ABDOMINAL  HYSTERECTOMY    . biopsy of right breast    . FOOT SURGERY    . implant left eye    . INTRAOCULAR LENS INSERTION       OB History    Gravida  4   Para  4   Term  4   Preterm      AB      Living        SAB      TAB      Ectopic      Multiple      Live Births               Home Medications    Prior to Admission medications   Medication Sig Start Date End Date Taking? Authorizing Provider  albuterol (PROVENTIL HFA;VENTOLIN HFA) 108 (90 BASE) MCG/ACT inhaler Inhale 2 puffs into the lungs every 6 (six) hours as needed for wheezing.   Yes [provider]  albuterol (PROVENTIL) (2.5 MG/3ML) 0.083% nebulizer solution Take 3 mLs (2.5 mg total) by nebulization every 6 (six) hours as needed for wheezing or shortness of breath. 04/29/18  Yes Gherghe, Vella Redhead, MD  amLODipine (NORVASC) 5 MG tablet Take 5 mg by mouth daily.   Yes [provider]  apixaban (ELIQUIS) 5 MG TABS tablet Take 1 tablet (5 mg total) by mouth 2 (two) times daily. 06/19/18 07/19/22 Yes Ghimire, Henreitta Leber, MD  chlorthalidone (HYGROTON) 25 MG tablet Take 25 mg by mouth  daily.   Yes [provider]  enoxaparin (LOVENOX) 150 MG/ML injection Inject 0.85 mLs (130 mg total) into the skin daily. Do not take Eliquis on Friday 2/7. Take lovenox shot on Friday 2/08 and Saturday 2/09. 07/04/18  Yes Derek Jack, MD  HYDROcodone-homatropine [facility]) 5-1.5 MG/5ML syrup Take 5 mLs by mouth every 4 (four) hours as needed for cough.  06/20/18  Yes [provider]  LORazepam (ATIVAN) 0.5 MG tablet Take 0.5 mg by mouth at bedtime as needed for anxiety.   Yes [provider]  losartan (COZAAR) 100 MG tablet Take 1 tablet (100 mg total) by mouth daily. 02/11/15  Yes Rolland Porter, MD  pantoprazole (PROTONIX) 40 MG tablet Take 1 tablet (40 mg total) by mouth daily. 06/18/18  Yes Ghimire, Henreitta Leber, MD  predniSONE (DELTASONE) 10 MG tablet Take 10 mg by mouth daily with breakfast.   Yes  [provider]  prochlorperazine (COMPAZINE) 10 MG tablet Take 1 tablet (10 mg total) by mouth every 6 (six) hours as needed for nausea or vomiting. 06/26/18  Yes Lockamy, Randi L, NP-C  Blood Glucose Monitoring Suppl (TRUE METRIX METER) DEVI 1 Device by Does not apply route daily. 04/29/18   Caren Griffins, MD    Family History Family History  Problem Relation Age of Onset  . Heart failure Mother   . Stroke Mother   . Heart attack Mother   . Kidney failure Other   . Leukemia Father     Social History Social History   Tobacco Use  . Smoking status: Never Smoker  . Smokeless tobacco: Never Used  Substance Use Topics  . Alcohol use: Not Currently    Comment: occ  . Drug use: No     Allergies   Banana and Aspirin   Review of Systems Review of Systems  Constitutional: Negative for fever.  Respiratory: Positive for cough and shortness of breath.   Cardiovascular: Negative for chest pain and leg swelling.  Gastrointestinal: Positive for nausea. Negative for abdominal pain.  All other systems reviewed and are negative.    Physical Exam Updated Vital Signs BP 138/81   Pulse (!) 108   Temp 100.1 F (37.8 C) (Oral)   Resp (!) 32   Ht 5\' 6"  (1.676 m)   Wt 87.5 kg   SpO2 97%   BMI 31.15 kg/m   Physical Exam Vitals signs and nursing note reviewed.  Constitutional:      General: She is not in acute distress.    Appearance: She is well-developed.  HENT:     Head: Normocephalic and atraumatic.     Right Ear: External ear normal.     Left Ear: External ear normal.     Nose: Nose normal.  Eyes:     General:        Right eye: No discharge.        Left eye: No discharge.  Cardiovascular:     Rate and Rhythm: Normal rate and regular rhythm.     Heart sounds: Normal heart sounds.  Pulmonary:     Effort: Pulmonary effort is normal. Tachypnea present. No accessory muscle usage.     Breath sounds: Examination of the right-lower field reveals decreased  breath sounds. Examination of the left-lower field reveals decreased breath sounds. Decreased breath sounds present.  Abdominal:     Palpations: Abdomen is soft.     Tenderness: There is no abdominal tenderness.  Musculoskeletal:     Right lower leg: No edema.  Left lower leg: No edema.  Skin:    General: Skin is warm and dry.  Neurological:     Mental Status: She is alert.  Psychiatric:        Mood and Affect: Mood is not anxious.      ED Treatments / Results  Labs (all labs ordered are listed, but only abnormal results are displayed) Labs Reviewed  COMPREHENSIVE METABOLIC PANEL - Abnormal; Notable for the following components:      Result Value   Sodium 132 (*)    Potassium 3.0 (*)    Chloride 94 (*)    Glucose, Bld 163 (*)    Creatinine, Ser 1.07 (*)    GFR calc non Af Amer 54 (*)    Anion gap 16 (*)    All other components within normal limits  CBC WITH DIFFERENTIAL/PLATELET - Abnormal; Notable for the following components:   WBC 18.2 (*)    MCH 25.9 (*)    Neutro Abs 14.7 (*)    Monocytes Absolute 2.2 (*)    Abs Immature Granulocytes 0.14 (*)    All other components within normal limits  CULTURE, BLOOD (ROUTINE X 2)  CULTURE, BLOOD (ROUTINE X 2)  RESPIRATORY PANEL BY PCR  EXPECTORATED SPUTUM ASSESSMENT W REFEX TO RESP CULTURE  GRAM STAIN  LACTIC ACID, PLASMA  BRAIN NATRIURETIC PEPTIDE  TROPONIN I  LACTIC ACID, PLASMA  URINALYSIS, ROUTINE W REFLEX MICROSCOPIC  MAGNESIUM  HIV ANTIBODY (ROUTINE TESTING W REFLEX)  STREP PNEUMONIAE URINARY ANTIGEN  HEMOGLOBIN A1C    EKG EKG Interpretation  Date/Time:  Sunday July 06 2018 12:22:19 EST Ventricular Rate:  123 PR Interval:    QRS Duration: 74 QT Interval:  330 QTC Calculation: 472 R Axis:   -11 Text Interpretation:  Sinus tachycardia Probable left atrial enlargement Inferior infarct, old Lateral leads are also involved no significant change since Nov 2019 Confirmed by Sherwood Gambler [phone]) on  07/06/2018 12:24:45 PM   Radiology Dg Chest 2 View  Result Date: 07/06/2018 CLINICAL DATA:  Worsening shortness of breath since last night. Lung cancer. EXAM: CHEST - 2 VIEW COMPARISON:  06/18/2018 PET-CT from 05/14/2018 FINDINGS: Continued right lower lobe airspace opacity. New left lower lobe airspace opacity. Patient has a known posterior mass on the left side. Low lung volumes are present, causing crowding of the pulmonary vasculature. Moderate enlargement of the cardiopericardial silhouette, at least some of which has been shown to be due to pericardial effusion on prior exams. Thoracic spondylosis.  Mild thickening along the minor fissure. IMPRESSION: 1. New airspace opacity in the left lower lobe. Although potentially from pneumonia, if the patient is receiving radiation therapy to the known left lower lobe mass then radiation pneumonitis would be a differential diagnostic consideration. 2. Stable airspace opacity in the right lower lobe. 3. Stable enlargement of the cardiopericardial silhouette, previous exams showed a component of pericardial effusion. Electronically Signed   By: Van Clines M.D.   On: 07/06/2018 13:50    Procedures Procedures (including critical care time)  Medications Ordered in ED Medications  vancomycin (VANCOCIN) IVPB 1000 mg/200 mL premix (has no administration in time range)  ceFEPIme (MAXIPIME) 2 g in sodium chloride 0.9 % 100 mL IVPB (has no administration in time range)  amLODipine (NORVASC) tablet 5 mg (has no administration in time range)  LORazepam (ATIVAN) tablet 0.5 mg (has no administration in time range)  chlorthalidone (HYGROTON) tablet 25 mg (has no administration in time range)  losartan (COZAAR) tablet 100 mg (has  no administration in time range)  predniSONE (STERAPRED UNI-PAK 21 TAB) tablet 20 mg (has no administration in time range)  predniSONE (STERAPRED UNI-PAK 21 TAB) tablet 10 mg (has no administration in time range)  predniSONE  (STERAPRED UNI-PAK 21 TAB) tablet 10 mg (has no administration in time range)  predniSONE (STERAPRED UNI-PAK 21 TAB) tablet 20 mg (has no administration in time range)  predniSONE (STERAPRED UNI-PAK 21 TAB) tablet 10 mg (has no administration in time range)  predniSONE (STERAPRED UNI-PAK 21 TAB) tablet 20 mg (has no administration in time range)  predniSONE (STERAPRED UNI-PAK 21 TAB) tablet 10 mg (has no administration in time range)  pantoprazole (PROTONIX) EC tablet 40 mg (has no administration in time range)  enoxaparin (LOVENOX) injection 130 mg (has no administration in time range)  albuterol (PROVENTIL) (2.5 MG/3ML) 0.083% nebulizer solution 2.5 mg (has no administration in time range)  HYDROcodone-homatropine (HYCODAN) 5-1.5 MG/5ML syrup 5 mL (has no administration in time range)  sodium chloride flush (NS) 0.9 % injection 3 mL (has no administration in time range)  sodium chloride flush (NS) 0.9 % injection 3 mL (has no administration in time range)  0.9 %  sodium chloride infusion (has no administration in time range)  insulin aspart (novoLOG) injection 0-20 Units (has no administration in time range)  insulin aspart (novoLOG) injection 0-5 Units (has no administration in time range)  insulin aspart (novoLOG) injection 6 Units (has no administration in time range)  acetaminophen (TYLENOL) tablet 650 mg (650 mg Oral Given 07/06/18 1350)  ceFEPIme (MAXIPIME) 2 g in sodium chloride 0.9 % 100 mL IVPB (0 g Intravenous Stopped 07/06/18 1457)  lactated ringers bolus 1,000 mL (0 mLs Intravenous Stopped 07/06/18 1502)  vancomycin (VANCOCIN) 1,750 mg in sodium chloride 0.9 % 500 mL IVPB (1,750 mg Intravenous New Bag/Given 07/06/18 1350)  potassium chloride 20 MEQ/15ML (10%) solution 40 mEq (40 mEq Oral Given 07/06/18 1502)     Initial Impression / Assessment and Plan / ED Course  I have reviewed the triage vital signs and the nursing notes.  Pertinent labs & imaging results that were available during  my care of the patient were reviewed by me and considered in my medical decision making (see chart for details).     Patient will be treated for sepsis/pneumonia.  She has been given broad, hospital-acquired pneumonia antibiotics.  Mild hypokalemia to be repleted orally.  Given the elevated WBC, tachycardia and dyspnea, meets sepsis criteria but lactate is okay.  She has had PE in the past but is currently anticoagulated with Lovenox and so I do not think CT angio is needed.  Dr. Evangeline Gula to admit.  Final Clinical Impressions(s) / ED Diagnoses   Final diagnoses:  HCAP (healthcare-associated pneumonia)    ED Discharge Orders    None       Sherwood Gambler, MD 07/06/18 [phone]

## 2018-07-06 NOTE — ED Notes (Signed)
Diet tray ordered for pt 

## 2018-07-07 ENCOUNTER — Other Ambulatory Visit (HOSPITAL_COMMUNITY): Payer: Self-pay | Admitting: *Deleted

## 2018-07-07 ENCOUNTER — Encounter (HOSPITAL_COMMUNITY): Payer: Self-pay | Admitting: Physician Assistant

## 2018-07-07 ENCOUNTER — Inpatient Hospital Stay (HOSPITAL_COMMUNITY): Payer: Medicare HMO

## 2018-07-07 ENCOUNTER — Ambulatory Visit (HOSPITAL_COMMUNITY): Admission: RE | Admit: 2018-07-07 | Payer: Medicare HMO | Source: Ambulatory Visit

## 2018-07-07 ENCOUNTER — Encounter (HOSPITAL_COMMUNITY): Payer: Self-pay

## 2018-07-07 DIAGNOSIS — I10 Essential (primary) hypertension: Secondary | ICD-10-CM

## 2018-07-07 DIAGNOSIS — J189 Pneumonia, unspecified organism: Secondary | ICD-10-CM

## 2018-07-07 DIAGNOSIS — C3491 Malignant neoplasm of unspecified part of right bronchus or lung: Secondary | ICD-10-CM

## 2018-07-07 DIAGNOSIS — Z5111 Encounter for antineoplastic chemotherapy: Secondary | ICD-10-CM

## 2018-07-07 DIAGNOSIS — R918 Other nonspecific abnormal finding of lung field: Secondary | ICD-10-CM

## 2018-07-07 DIAGNOSIS — I2699 Other pulmonary embolism without acute cor pulmonale: Secondary | ICD-10-CM

## 2018-07-07 LAB — ECHOCARDIOGRAM LIMITED
Height: 66 in
Weight: 3037.06 oz

## 2018-07-07 LAB — RESPIRATORY PANEL BY PCR
Adenovirus: NOT DETECTED
BORDETELLA PERTUSSIS-RVPCR: NOT DETECTED
Chlamydophila pneumoniae: NOT DETECTED
Coronavirus 229E: NOT DETECTED
Coronavirus HKU1: NOT DETECTED
Coronavirus NL63: NOT DETECTED
Coronavirus OC43: NOT DETECTED
Influenza A: NOT DETECTED
Influenza B: NOT DETECTED
Metapneumovirus: NOT DETECTED
Mycoplasma pneumoniae: NOT DETECTED
Parainfluenza Virus 1: NOT DETECTED
Parainfluenza Virus 2: NOT DETECTED
Parainfluenza Virus 3: NOT DETECTED
Parainfluenza Virus 4: NOT DETECTED
Respiratory Syncytial Virus: NOT DETECTED
Rhinovirus / Enterovirus: NOT DETECTED

## 2018-07-07 LAB — BASIC METABOLIC PANEL
Anion gap: 10 (ref 5–15)
BUN: 12 mg/dL (ref 8–23)
CO2: 23 mmol/L (ref 22–32)
Calcium: 8.7 mg/dL — ABNORMAL LOW (ref 8.9–10.3)
Chloride: 100 mmol/L (ref 98–111)
Creatinine, Ser: 0.97 mg/dL (ref 0.44–1.00)
GFR calc Af Amer: >60 mL/min (ref >60)
GFR calc non Af Amer: >60 mL/min (ref >60)
Glucose, Bld: 196 mg/dL — ABNORMAL HIGH (ref 70–99)
Potassium: 3.2 mmol/L — ABNORMAL LOW (ref 3.5–5.1)
Sodium: 133 mmol/L — ABNORMAL LOW (ref 135–145)

## 2018-07-07 LAB — CBC
HCT: 35.8 % — ABNORMAL LOW (ref 36.0–46.0)
Hemoglobin: 11.4 g/dL — ABNORMAL LOW (ref 12.0–15.0)
MCH: 25.8 pg — ABNORMAL LOW (ref 26.0–34.0)
MCHC: 31.8 g/dL (ref 30.0–36.0)
MCV: 81 fL (ref 80.0–100.0)
PLATELETS: 209 10*3/uL (ref 150–400)
RBC: 4.42 MIL/uL (ref 3.87–5.11)
RDW: 15.2 % (ref 11.5–15.5)
WBC: 19.1 10*3/uL — ABNORMAL HIGH (ref 4.0–10.5)
nRBC: 0 % (ref 0.0–0.2)

## 2018-07-07 LAB — GLUCOSE, CAPILLARY
Glucose-Capillary: 168 mg/dL — ABNORMAL HIGH (ref 70–99)
Glucose-Capillary: 145 mg/dL — ABNORMAL HIGH (ref 70–99)
Glucose-Capillary: 93 mg/dL (ref 70–99)
Glucose-Capillary: 187 mg/dL — ABNORMAL HIGH (ref 70–99)

## 2018-07-07 LAB — HIV ANTIBODY (ROUTINE TESTING W REFLEX): HIV Screen 4th Generation wRfx: NONREACTIVE

## 2018-07-07 LAB — APTT: aPTT: 45 seconds — ABNORMAL HIGH (ref 24–36)

## 2018-07-07 LAB — HEPARIN LEVEL (UNFRACTIONATED): HEPARIN UNFRACTIONATED: 0.8 [IU]/mL — AB (ref 0.30–0.70)

## 2018-07-07 MED ORDER — IOHEXOL 300 MG/ML  SOLN
100.0000 mL | Freq: Once | INTRAMUSCULAR | Status: AC | PRN
  Administered 2018-07-07: 100 mL via INTRAVENOUS

## 2018-07-07 MED ORDER — HEPARIN (PORCINE) 25000 UT/250ML-% IV SOLN
1850.0000 [IU]/h | INTRAVENOUS | Status: DC
  Administered 2018-07-07: 1200 [IU]/h via INTRAVENOUS
  Administered 2018-07-08: 1350 [IU]/h via INTRAVENOUS
  Administered 2018-07-09: 1600 [IU]/h via INTRAVENOUS
  Administered 2018-07-11: 1700 [IU]/h via INTRAVENOUS
  Administered 2018-07-12: 1850 [IU]/h via INTRAVENOUS
  Filled 2018-07-07 (×6): qty 250

## 2018-07-07 MED ORDER — TRAMADOL HCL 50 MG PO TABS
50.0000 mg | ORAL_TABLET | Freq: Four times a day (QID) | ORAL | Status: DC | PRN
  Administered 2018-07-07 – 2018-07-11 (×5): 50 mg via ORAL
  Filled 2018-07-07 (×5): qty 1

## 2018-07-07 NOTE — H&P (Signed)
Chief Complaint: Adenocarcinoma of the left lung  Referring Physician(s): Jonetta Osgood  Supervising Physician: Daryll Brod  Patient Status: [facility] - In-pt  History of Present Illness: Kaitlin [name] is a 68 y.o. female who is known to our service.  She presented to [facility] ED 04/24/2018 with complaints of gradually worsening dyspnea and tachypnea x 3 days.   She was found to have a large right PE along with a left lower lobe lung mass.  CTA chest 04/24/2018: 1. Large pulmonary embolus in descending interlobar pulmonary artery with occlusion of RIGHT lower lobe pulmonary artery and additional smaller clots noted in RIGHT middle and RIGHT upper lobes. 2. Associated RIGHT pleural effusion and atelectasis with diffuse RIGHT lower lobe infiltrate which could represent pneumonia or infarct. 3. LEFT lower lobe mass 3.8 x 3.7 x 2.1 cm associated with hilar and mediastinal adenopathy highly suspicious for a pulmonary neoplasm.  NM PET 05/14/2018: 1. Hypermetabolic left lower lobe pulmonary mass compatible with primary bronchogenic neoplasm. 2. The airspace disease seen previously in the right lower lobe has become less diffuse although is now more organized in the posterior and inferior aspect of the right lower lobe. The disease shows a surrounding hypermetabolic rim in there is a hypermetabolic nodule medial to the main airspace disease. While this may reflect of evolving infection/inflammation, adenocarcinoma could certainly have this appearance. 3. Hypermetabolic mediastinal and bilateral hilar lymphadenopathy consistent with metastatic disease. 4. Diffusely thickened and hypermetabolic adrenal glands. Metastatic involvement a concern. 5. Aortic Atherosclerois (ICD10-170.0).   She was admitted to bridge Eliquis with IV Heparin and had a lung biopsy on 06/17/18.   Pathology revealed adenocarcinoma however there was not enough tissue for genetic testing.  We are asked to perform  another lung biopsy so tissue can be sent for genetic testing.  Eliquis is again bridged with heparin drip.   Past Medical History:  Diagnosis Date  . Asthma   . Carpal tunnel syndrome   . Cataract    left eye  . Diabetes mellitus without complication ([place])   . DVT (deep venous thrombosis) ([place])   . Hypertension   . Pulmonary embolism ([place]) 03/2018    Past Surgical History:  Procedure Laterality Date  . ABDOMINAL HYSTERECTOMY    . biopsy of right breast    . FOOT SURGERY    . implant left eye    . INTRAOCULAR LENS INSERTION      Allergies: Banana and Aspirin  Medications: Prior to Admission medications   Medication Sig Start Date End Date Taking? Authorizing Provider  albuterol (PROVENTIL HFA;VENTOLIN HFA) 108 (90 BASE) MCG/ACT inhaler Inhale 2 puffs into the lungs every 6 (six) hours as needed for wheezing.   Yes [provider]  albuterol (PROVENTIL) (2.5 MG/3ML) 0.083% nebulizer solution Take 3 mLs (2.5 mg total) by nebulization every 6 (six) hours as needed for wheezing or shortness of breath. 04/29/18  Yes Gherghe, Vella Redhead, MD  amLODipine (NORVASC) 5 MG tablet Take 5 mg by mouth daily.   Yes [provider]  apixaban (ELIQUIS) 5 MG TABS tablet Take 1 tablet (5 mg total) by mouth 2 (two) times daily. 06/19/18 07/19/22 Yes Ghimire, Henreitta Leber, MD  chlorthalidone (HYGROTON) 25 MG tablet Take 25 mg by mouth daily.   Yes [provider]  enoxaparin (LOVENOX) 150 MG/ML injection Inject 0.85 mLs (130 mg total) into the skin daily. Do not take Eliquis on Friday 2/7. Take lovenox shot on Friday 2/08 and Saturday 2/09.  07/04/18  Yes Derek Jack, MD  HYDROcodone-homatropine [facility]) 5-1.5 MG/5ML syrup Take 5 mLs by mouth every 4 (four) hours as needed for cough.  06/20/18  Yes [provider]  LORazepam (ATIVAN) 0.5 MG tablet Take 0.5 mg by mouth at bedtime as needed for anxiety.   Yes [provider]  losartan (COZAAR) 100 MG tablet  Take 1 tablet (100 mg total) by mouth daily. 02/11/15  Yes Rolland Porter, MD  pantoprazole (PROTONIX) 40 MG tablet Take 1 tablet (40 mg total) by mouth daily. 06/18/18  Yes Ghimire, Henreitta Leber, MD  predniSONE (DELTASONE) 10 MG tablet Take 10 mg by mouth daily with breakfast.   Yes [provider]  prochlorperazine (COMPAZINE) 10 MG tablet Take 1 tablet (10 mg total) by mouth every 6 (six) hours as needed for nausea or vomiting. 06/26/18  Yes Lockamy, Randi L, NP-C  Blood Glucose Monitoring Suppl (TRUE METRIX METER) DEVI 1 Device by Does not apply route daily. 04/29/18   Caren Griffins, MD     Family History  Problem Relation Age of Onset  . Heart failure Mother   . Stroke Mother   . Heart attack Mother   . Kidney failure Other   . Leukemia Father     Social History   Socioeconomic History  . Marital status: Widowed    Spouse name: Not on file  . Number of children: 4  . Years of education: 27  . Highest education level: High school graduate  Occupational History  . Not on file  Social Needs  . Financial resource strain: Very hard  . Food insecurity:    Worry: Sometimes true    Inability: Sometimes true  . Transportation needs:    Medical: No    Non-medical: No  Tobacco Use  . Smoking status: Never Smoker  . Smokeless tobacco: Never Used  Substance and Sexual Activity  . Alcohol use: Not Currently    Comment: occ  . Drug use: No  . Sexual activity: Yes    Partners: Male    Birth control/protection: Surgical  Lifestyle  . Physical activity:    Days per week: 0 days    Minutes per session: Not on file  . Stress: Only a little  Relationships  . Social connections:    Talks on phone: More than three times a week    Gets together: More than three times a week    Attends religious service: More than 4 times per year    Active member of club or organization: Yes    Attends meetings of clubs or organizations: More than 4 times per year    Relationship status:  Widowed  Other Topics Concern  . 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     Review of Systems: A 12 point ROS discussed and pertinent positives are indicated in the HPI above.  All other systems are negative.  Review of Systems  Vital Signs: BP 117/76 (BP Location: Left Arm)   Pulse 93   Temp 98.7 F (37.1 C)   Resp 16   Ht 5\' 6"  (1.676 m)   Wt 86.1 kg   SpO2 93%   BMI 30.64 kg/m   Physical Exam Constitutional:  Appearance: Normal appearance.  HENT:     Head: Normocephalic and atraumatic.  Eyes:     Extraocular Movements: Extraocular movements intact.  Neck:     Musculoskeletal: Normal range of motion.  Cardiovascular:     Rate and Rhythm: Normal rate and regular rhythm.  Pulmonary:     Effort: Pulmonary effort is normal.     Breath sounds: Normal breath sounds.  Abdominal:     Palpations: Abdomen is soft.  Musculoskeletal: Normal range of motion.  Skin:    General: Skin is warm and dry.  Neurological:     General: No focal deficit present.     Mental Status: She is alert and oriented to person, place, and time.  Psychiatric:        Mood and Affect: Mood normal.        Behavior: Behavior normal.        Thought Content: Thought content normal.        Judgment: Judgment normal.     Imaging: Dg Chest 2 View  Result Date: 07/06/2018 CLINICAL DATA:  Worsening shortness of breath since last night. Lung cancer. EXAM: CHEST - 2 VIEW COMPARISON:  06/18/2018 PET-CT from 05/14/2018 FINDINGS: Continued right lower lobe airspace opacity. New left lower lobe airspace opacity. Patient has a known posterior mass on the left side. Low lung volumes are present, causing crowding of the pulmonary vasculature. Moderate enlargement of the cardiopericardial silhouette, at  least some of which has been shown to be due to pericardial effusion on prior exams. Thoracic spondylosis.  Mild thickening along the minor fissure. IMPRESSION: 1. New airspace opacity in the left lower lobe. Although potentially from pneumonia, if the patient is receiving radiation therapy to the known left lower lobe mass then radiation pneumonitis would be a differential diagnostic consideration. 2. Stable airspace opacity in the right lower lobe. 3. Stable enlargement of the cardiopericardial silhouette, previous exams showed a component of pericardial effusion. Electronically Signed   By: Van Clines M.D.   On: 07/06/2018 13:50   Ct Biopsy  Result Date: 06/18/2018 INDICATION: Left lower lobe hypermetabolic lung mass EXAM: CT BIOPSY MEDICATIONS: None. ANESTHESIA/SEDATION: Fentanyl 100 mcg IV; Versed 2 mg IV Moderate Sedation Time:  13 minutes The patient was continuously monitored during the procedure by the interventional radiology nurse under my direct supervision. FLUOROSCOPY TIME:  None COMPLICATIONS: None immediate. PROCEDURE: Informed written consent was obtained from the patient after a thorough discussion of the procedural risks, benefits and alternatives. All questions were addressed. Maximal Sterile Barrier Technique was utilized including caps, mask, sterile gowns, sterile gloves, sterile drape, hand hygiene and skin antiseptic. A timeout was performed prior to the initiation of the procedure. Under CT guidance, a(n) 17 gauge guide needle was advanced into the left lower lobe lung mass. Subsequently, 3 18 gauge core biopsies were obtained. A collagen plug was deployed in the needle tract. The guide needle was removed. Post biopsy images demonstrate no pneumothorax. Patient tolerated the procedure well without complication. Vital sign monitoring by nursing staff during the procedure will continue as patient is in the special procedures unit for post procedure observation. FINDINGS: The  images document guide needle placement within the left lower lobe lung mass. Post biopsy images demonstrate no pneumothorax. IMPRESSION: Successful CT-guided core biopsy of a left lower lobe lung mass. Electronically Signed   By: Marybelle Killings M.D.   On: 06/18/2018 12:17   Dg Chest Port 1 View  Result Date: 06/18/2018 CLINICAL DATA:  Status post left  lung biopsy EXAM: PORTABLE CHEST 1 VIEW COMPARISON:  CT from earlier in the same day. FINDINGS: Cardiac shadow is enlarged but stable. Left lung base is again identified retrocardiac position. No underlying pneumothorax is seen. Persistent density in the right base stable from the prior CT is noted. No bony abnormality is noted. IMPRESSION: No pneumothorax following lung biopsy on the left. Electronically Signed   By: Inez Catalina M.D.   On: 06/18/2018 13:25    Labs:  CBC: Recent Labs    06/16/18 1527 06/18/18 0438 07/06/18 1239 07/07/18 0343  WBC 5.2 8.1 18.2* 19.1*  HGB 12.9 11.7* 12.7 11.4*  HCT 39.1 36.2 39.7 35.8*  PLT 306 278 243 209    COAGS: Recent Labs    06/16/18 1527 06/17/18 0215 06/17/18 1134 06/18/18 0438  INR  --   --   --  1.21  APTT 29 68* 85* 80*    BMP: Recent Labs    06/17/18 0215 06/18/18 0438 07/06/18 1239 07/07/18 0343  NA 135 135 132* 133*  K 3.3* 3.0* 3.0* 3.2*  CL 101 99 94* 100  CO2 25 25 22 23   GLUCOSE 106* 114* 163* 196*  BUN 9 7* 12 12  CALCIUM 9.6 9.6 9.6 8.7*  CREATININE 0.85 0.95 1.07* 0.97  GFRNONAA >60 >60 54* >60  GFRAA >60 >60 >60 >60    LIVER FUNCTION TESTS: Recent Labs    04/24/18 1347 06/16/18 1527 07/06/18 1239  BILITOT 1.9* 0.5 0.9  AST 54* 19 18  ALT 44 14 15  ALKPHOS 188* 100 85  PROT 7.8 7.9 7.6  ALBUMIN 3.5 3.9 3.6    TUMOR MARKERS: No results for input(s): AFPTM, CEA, CA199, CHROMGRNA in the last 8760 hours.  Assessment and Plan:  Recent diagnosis of adenocarcinoma of the left lung but not enough tissue to send for genetic testing.  Recent PE - being  bridged with heparin drip.  Will proceed with image guided lung biopsy tomorrow by Dr. Anselm Pancoast  Risks and benefits discussed with the patient and her sister including, but not limited to bleeding, hemoptysis, respiratory failure requiring intubation, infection, pneumothorax requiring chest tube placement, stroke from air embolism or even death.  All questions were answered, patient is agreeable to proceed. Consent signed and in chart.  Thank you for this interesting consult.  I greatly enjoyed meeting SKILA ROLLINS and look forward to participating in their care.  A copy of this report was sent to the requesting provider on this date.  Electronically Signed: Murrell Redden, PA-C   07/07/2018, 11:10 AM      I spent a total of  25 Minutes in face to face in clinical consultation, greater than 50% of which was counseling/coordinating care for lung biopsy.

## 2018-07-07 NOTE — Progress Notes (Signed)
Change in order per IR department

## 2018-07-07 NOTE — Progress Notes (Signed)
Patient is currently admitted and will be undergoing interventional radiology CT guided biopsy.  Dr. Constance Haw asked that we get the port a cath placed at the same time since patient will already be bridged with heparin.  I have placed order and will notify central scheduling to coordinate the two procedures.

## 2018-07-07 NOTE — Progress Notes (Addendum)
PROGRESS NOTE        PATIENT DETAILS Name: Kaitlin [name] Age: 68 y.o. Sex: female Date of Birth: 04-01-1951 Admit Date: 07/06/2018 Admitting Physician Lahoma Crocker, MD [id number], Channing Mutters, MD  Brief Narrative: Patient is a 68 y.o. female with with recent diagnosis of adenocarcinoma of the lung, venous thromboembolism-presented to the hospital for evaluation of shortness of breath, cough, low-grade fever and pleuritic left-sided chest pain.  Thought to have pneumonia and admitted to the hospitalist service.  See below for further details.  Subjective: Continues to have pleuritic left-sided chest pain-low-grade fever overnight.  Continues to cough.  Assessment/Plan: Sepsis secondary to left lobar pneumonia: Sepsis pathophysiology slowly improving, low-grade fever overnight, leukocytosis persists.  Reviewed chest x-ray personally-appears to have a dense left lower lobe infiltrate-but not sure if she has some amount of effusion as well-we will get CT chest to delineate further.  Apparently was started on prednisone just a few days back-do not hear any wheezing-hence.Marland Kitchen  Awaiting respiratory virus panel, blood cultures.  In the meantime, continue with empiric vancomycin and cefepime.    History of pulmonary embolism November 2019: We will stop Lovenox and switch to IV heparin-as patient likely will require lung biopsy (see below)  Adenocarcinoma of the lung: Recently diagnosed with adenocarcinoma of the lung-did undergo a lung biopsy on 1/22-but needs more tissue for genetic testing.  IR consulted-switch from Lovenox to IV heparin-once repeat lung biopsy is complete-we will switch back to oral anticoagulation.  Follows with oncology at [facility].  Hypertension: Blood pressure controlled-continue chlorthalidone, losartan  Addendum 5:55 PM: CT chest done earlier today showed moderate to large pericardial effusion, I had ordered a limited echocardiogram-was called  by cardiologist-Dr. Ross-pericardial effusion has enlarged when compared to her prior echo-but no signs of obvious tamponade.  Recommends that we touch base with oncology, and watch her closely for any signs of hemodynamic instability.  This MD will touch base with oncology tomorrow, and if needed formally consult cardiology.  I suspect this is a malignant effusion.  DVT Prophylaxis: Full dose anticoagulation with Heparin  Code Status: Full code   Family Communication: Two family members at bedside at bedside  Disposition Plan: Remain inpatient-home over the next few days  Antimicrobial agents: Anti-infectives (From admission, onward)   Start     Dose/Rate Route Frequency Ordered Stop   07/07/18 1500  vancomycin (VANCOCIN) IVPB 1000 mg/200 mL premix     1,000 mg 200 mL/hr over 60 Minutes Intravenous Every 24 hours 07/06/18 1346     07/07/18 0200  ceFEPIme (MAXIPIME) 2 g in sodium chloride 0.9 % 100 mL IVPB     2 g 200 mL/hr over 30 Minutes Intravenous Every 12 hours 07/06/18 1346     07/06/18 1300  vancomycin (VANCOCIN) 1,750 mg in sodium chloride 0.9 % 500 mL IVPB     1,750 mg 250 mL/hr over 120 Minutes Intravenous STAT 07/06/18 1250 07/06/18 1826   07/06/18 1245  vancomycin (VANCOCIN) IVPB 1000 mg/200 mL premix  Status:  Discontinued     1,000 mg 200 mL/hr over 60 Minutes Intravenous  Once 07/06/18 1238 07/06/18 1250   07/06/18 1245  ceFEPIme (MAXIPIME) 2 g in sodium chloride 0.9 % 100 mL IVPB     2 g 200 mL/hr over 30 Minutes Intravenous  Once 07/06/18 1238 07/06/18 1457  Procedures: None  CONSULTS:  IR  Time spent: 25 minutes-Greater than 50% of this time was spent in counseling, explanation of diagnosis, planning of further management, and coordination of care.  MEDICATIONS: Scheduled Meds: . amLODipine  5 mg Oral Daily  . chlorthalidone  25 mg Oral Daily  . insulin aspart  0-20 Units Subcutaneous TID WC  . insulin aspart  0-5 Units Subcutaneous QHS  .  insulin aspart  6 Units Subcutaneous TID WC  . losartan  100 mg Oral Daily  . pantoprazole  40 mg Oral Daily  . predniSONE  10 mg Oral 3 x daily with food  . [START ON 07/08/2018] predniSONE  10 mg Oral 4X daily taper  . predniSONE  20 mg Oral Nightly  . sodium chloride flush  3 mL Intravenous Q12H   Continuous Infusions: . sodium chloride    . ceFEPime (MAXIPIME) IV 2 g (07/07/18 1303)  . heparin    . vancomycin     PRN Meds:.sodium chloride, albuterol, HYDROcodone-homatropine, LORazepam, sodium chloride flush, traMADol   PHYSICAL EXAM: Vital signs: Vitals:   07/06/18 1730 07/06/18 1807 07/06/18 2153 07/07/18 0511  BP: (!) 143/78 117/68 (!) 149/83 117/76  Pulse: (!) 111 98 (!) 108 93  Resp: (!) 28 (!) 24 20 16   Temp:  99.8 F (37.7 C) 100.1 F (37.8 C) 98.7 F (37.1 C)  TempSrc:      SpO2: 94% 98% 93%   Weight:  86.1 kg    Height:       Filed Weights   07/06/18 1225 07/06/18 1807  Weight: 87.5 kg 86.1 kg   Body mass index is 30.64 kg/m.   General appearance :Awake, alert, not in any distress. HEENT: Atraumatic and Normocephalic Neck: supple, no JVD. Resp: Decreased air entry at the left base-few rales on the left base CVS: S1 S2 regular, no murmurs.  GI: Bowel sounds present, Non tender and not distended with no gaurding, rigidity or rebound.No organomegaly Extremities: B/L Lower Ext shows no edema, both legs are warm to touch Neurology:  speech clear,Non focal, sensation is grossly intact. Musculoskeletal:No digital cyanosis Skin:No Rash, warm and dry Wounds:N/A  I have personally reviewed following labs and imaging studies  LABORATORY DATA: CBC: Recent Labs  Lab 07/06/18 1239 07/07/18 0343  WBC 18.2* 19.1*  NEUTROABS 14.7*  --   HGB 12.7 11.4*  HCT 39.7 35.8*  MCV 81.0 81.0  PLT 243 209    Basic Metabolic Panel: Recent Labs  Lab 07/06/18 1239 07/07/18 0343  NA 132* 133*  K 3.0* 3.2*  CL 94* 100  CO2 22 23  GLUCOSE 163* 196*  BUN 12 12    CREATININE 1.07* 0.97  CALCIUM 9.6 8.7*  MG 1.4*  --     GFR: Estimated Creatinine Clearance: 62.2 mL/min (by C-G formula based on SCr of 0.97 mg/dL).  Liver Function Tests: Recent Labs  Lab 07/06/18 1239  AST 18  ALT 15  ALKPHOS 85  BILITOT 0.9  PROT 7.6  ALBUMIN 3.6   No results for input(s): LIPASE, AMYLASE in the last 168 hours. No results for input(s): AMMONIA in the last 168 hours.  Coagulation Profile: No results for input(s): INR, PROTIME in the last 168 hours.  Cardiac Enzymes: Recent Labs  Lab 07/06/18 1239  TROPONINI <0.03    BNP (last 3 results) No results for input(s): PROBNP in the last 8760 hours.  HbA1C: Recent Labs    07/06/18 1836  HGBA1C 6.5*    CBG: Recent  Labs  Lab 07/06/18 2153 07/07/18 0755 07/07/18 1229  GLUCAP 127* 168* 145*    Lipid Profile: No results for input(s): CHOL, HDL, LDLCALC, TRIG, CHOLHDL, LDLDIRECT in the last 72 hours.  Thyroid Function Tests: No results for input(s): TSH, T4TOTAL, FREET4, T3FREE, THYROIDAB in the last 72 hours.  Anemia Panel: No results for input(s): VITAMINB12, FOLATE, FERRITIN, TIBC, IRON, RETICCTPCT in the last 72 hours.  Urine analysis:    Component Value Date/Time   COLORURINE YELLOW 07/06/2018 1555   APPEARANCEUR CLEAR 07/06/2018 1555   LABSPEC 1.012 07/06/2018 1555   PHURINE 6.0 07/06/2018 1555   GLUCOSEU NEGATIVE 07/06/2018 1555   HGBUR MODERATE (A) 07/06/2018 1555   BILIRUBINUR NEGATIVE 07/06/2018 1555   KETONESUR NEGATIVE 07/06/2018 1555   PROTEINUR NEGATIVE 07/06/2018 1555   UROBILINOGEN 0.2 04/11/2007 1936   NITRITE NEGATIVE 07/06/2018 1555   LEUKOCYTESUR MODERATE (A) 07/06/2018 1555    Sepsis Labs: Lactic Acid, Venous    Component Value Date/Time   LATICACIDVEN 1.4 07/06/2018 1836    MICROBIOLOGY: No results found for this or any previous visit (from the past 240 hour(s)).  RADIOLOGY STUDIES/RESULTS: Dg Chest 2 View  Result Date: 07/06/2018 CLINICAL DATA:   Worsening shortness of breath since last night. Lung cancer. EXAM: CHEST - 2 VIEW COMPARISON:  06/18/2018 PET-CT from 05/14/2018 FINDINGS: Continued right lower lobe airspace opacity. New left lower lobe airspace opacity. Patient has a known posterior mass on the left side. Low lung volumes are present, causing crowding of the pulmonary vasculature. Moderate enlargement of the cardiopericardial silhouette, at least some of which has been shown to be due to pericardial effusion on prior exams. Thoracic spondylosis.  Mild thickening along the minor fissure. IMPRESSION: 1. New airspace opacity in the left lower lobe. Although potentially from pneumonia, if the patient is receiving radiation therapy to the known left lower lobe mass then radiation pneumonitis would be a differential diagnostic consideration. 2. Stable airspace opacity in the right lower lobe. 3. Stable enlargement of the cardiopericardial silhouette, previous exams showed a component of pericardial effusion. Electronically Signed   By: Gaylyn Rong M.D.   On: 07/06/2018 13:50   Ct Biopsy  Result Date: 06/18/2018 INDICATION: Left lower lobe hypermetabolic lung mass EXAM: CT BIOPSY MEDICATIONS: None. ANESTHESIA/SEDATION: Fentanyl 100 mcg IV; Versed 2 mg IV Moderate Sedation Time:  13 minutes The patient was continuously monitored during the procedure by the interventional radiology nurse under my direct supervision. FLUOROSCOPY TIME:  None COMPLICATIONS: None immediate. PROCEDURE: Informed written consent was obtained from the patient after a thorough discussion of the procedural risks, benefits and alternatives. All questions were addressed. Maximal Sterile Barrier Technique was utilized including caps, mask, sterile gowns, sterile gloves, sterile drape, hand hygiene and skin antiseptic. A timeout was performed prior to the initiation of the procedure. Under CT guidance, a(n) 17 gauge guide needle was advanced into the left lower lobe lung  mass. Subsequently, 3 18 gauge core biopsies were obtained. A collagen plug was deployed in the needle tract. The guide needle was removed. Post biopsy images demonstrate no pneumothorax. Patient tolerated the procedure well without complication. Vital sign monitoring by nursing staff during the procedure will continue as patient is in the special procedures unit for post procedure observation. FINDINGS: The images document guide needle placement within the left lower lobe lung mass. Post biopsy images demonstrate no pneumothorax. IMPRESSION: Successful CT-guided core biopsy of a left lower lobe lung mass. Electronically Signed   By: Dahlia Client.D.  On: 06/18/2018 12:17   Dg Chest Port 1 View  Result Date: 06/18/2018 CLINICAL DATA:  Status post left lung biopsy EXAM: PORTABLE CHEST 1 VIEW COMPARISON:  CT from earlier in the same day. FINDINGS: Cardiac shadow is enlarged but stable. Left lung base is again identified retrocardiac position. No underlying pneumothorax is seen. Persistent density in the right base stable from the prior CT is noted. No bony abnormality is noted. IMPRESSION: No pneumothorax following lung biopsy on the left. Electronically Signed   By: Alcide Clever M.D.   On: 06/18/2018 13:25     LOS: 1 day   Jeoffrey Massed, MD  [facility]  If 7PM-7AM, please contact night-coverage  Please page via [email or web address]  Go to [email or web address] and use [facility]'s universal password to access. If you do not have the password, please contact the hospital operator.  Locate the [facility] provider you are looking for under [facility] and page to a number that you can be directly reached. If you still have difficulty reaching the provider, please page the [facility] (Director on Call) for the Hospitalists listed on amion for assistance.  07/07/2018, 1:55 PM

## 2018-07-07 NOTE — Progress Notes (Signed)
ANTICOAGULATION CONSULT NOTE -   Pharmacy Consult for Heparin bridge (holding Apixaban) Indication: History of DVT/PE  Allergies  Allergen Reactions  . Banana Anaphylaxis  . Aspirin Other (See Comments)    G.I. Upset     Patient Measurements: Height: 5\' 6"  (167.6 cm) Weight: 189 lb 13.1 oz (86.1 kg) IBW/kg (Calculated) : 59.3 Heparin Dosing Weight: 77.7 kg  Vital Signs: Temp: 98.7 F (37.1 C) (02/10 0511) BP: 117/76 (02/10 0511) Pulse Rate: 93 (02/10 0511)  Labs: Recent Labs    07/06/18 1239 07/07/18 0343  HGB 12.7 11.4*  HCT 39.7 35.8*  PLT 243 209  CREATININE 1.07* 0.97  TROPONINI <0.03  --     Estimated Creatinine Clearance: 62.2 mL/min (by C-G formula based on SCr of 0.97 mg/dL).   Medical History: Past Medical History:  Diagnosis Date  . Asthma   . Carpal tunnel syndrome   . Cataract    left eye  . Diabetes mellitus without complication ([place])   . DVT (deep venous thrombosis) ([place])   . Hypertension   . Pulmonary embolism ([place]) 03/2018    Medications:  Eliquis 5mg  BID (last dose taken PTA  07/07/18 at 1000)  Assessment: Pharmacy consulted to switching from lovenox to IV heparin infusion (no bolus) as patient will likely get a ct guided lung bx tomorrow 07/08/18. .   41 YOF with recent hx of PE/DVT 03/2018.  He was on Apixaban 5mg  bid prior to admission, last taken pta 07/04/18 at 10:00 AM.  Apixaban held PTA for a procedure and bridged with Lovenox PTA 1.5 mg/kg q24h  taken on 2/8 pta.  She has received Lovenox 1.5 mg/kg SQ q24h on 07/06/18 at ~22:00 last night, which is full dose anticoagulation.  We will start IV heparin at 20:00 tonight, no bolus.   Goal of Therapy:  Heparin level 0.3-0.7 units/ml (aPTT = 66-102) Monitor platelets by anticoagulation protocol: Yes   Plan:  Discontinue SQ Lovenox now Check baseline aPTT and heparin level prior to heparin due to last dose of apixaban given 2/7 pta.  Tonight at 20:00 start IV heparin drip 1200  units/hr Check 6-8 hour aPTT, heparin level.  Daily aPTT, HL, CBC.  Thank you for allowing pharmacy to be part of this patients care team.  Nicole Cella, RPh Clinical Pharmacist [phone] Please utilize Amion for appropriate phone number to reach the unit pharmacist ([place])

## 2018-07-07 NOTE — Progress Notes (Signed)
  Echocardiogram 2D Echocardiogram has been performed.  Kaitlin [name] 07/07/2018, 5:15 PM

## 2018-07-08 DIAGNOSIS — I3131 Malignant pericardial effusion in diseases classified elsewhere: Secondary | ICD-10-CM | POA: Diagnosis present

## 2018-07-08 DIAGNOSIS — I313 Pericardial effusion (noninflammatory): Secondary | ICD-10-CM

## 2018-07-08 DIAGNOSIS — C801 Malignant (primary) neoplasm, unspecified: Secondary | ICD-10-CM

## 2018-07-08 LAB — CBC
HEMATOCRIT: 32.7 % — AB (ref 36.0–46.0)
HEMOGLOBIN: 10.5 g/dL — AB (ref 12.0–15.0)
MCH: 25.8 pg — ABNORMAL LOW (ref 26.0–34.0)
MCHC: 32.1 g/dL (ref 30.0–36.0)
MCV: 80.3 fL (ref 80.0–100.0)
Platelets: 240 10*3/uL (ref 150–400)
RBC: 4.07 MIL/uL (ref 3.87–5.11)
RDW: 15.1 % (ref 11.5–15.5)
WBC: 16.5 10*3/uL — ABNORMAL HIGH (ref 4.0–10.5)
nRBC: 0 % (ref 0.0–0.2)

## 2018-07-08 LAB — HEPARIN LEVEL (UNFRACTIONATED): Heparin Unfractionated: 0.51 IU/mL (ref 0.30–0.70)

## 2018-07-08 LAB — APTT
aPTT: 63 seconds — ABNORMAL HIGH (ref 24–36)
aPTT: 65 seconds — ABNORMAL HIGH (ref 24–36)
aPTT: 67 seconds — ABNORMAL HIGH (ref 24–36)

## 2018-07-08 LAB — GLUCOSE, CAPILLARY
GLUCOSE-CAPILLARY: 102 mg/dL — AB (ref 70–99)
GLUCOSE-CAPILLARY: 139 mg/dL — AB (ref 70–99)
Glucose-Capillary: 87 mg/dL (ref 70–99)
Glucose-Capillary: 72 mg/dL (ref 70–99)

## 2018-07-08 MED ORDER — ORAL CARE MOUTH RINSE
15.0000 mL | Freq: Two times a day (BID) | OROMUCOSAL | Status: DC
  Administered 2018-07-09 – 2018-07-12 (×7): 15 mL via OROMUCOSAL

## 2018-07-08 NOTE — Progress Notes (Signed)
ANTICOAGULATION CONSULT NOTE -   Pharmacy Consult for Heparin bridge (holding Apixaban) Indication: History of DVT/PE  Allergies  Allergen Reactions  . Banana Anaphylaxis  . Aspirin Other (See Comments)    G.I. Upset     Patient Measurements: Height: 5\' 6"  (167.6 cm) Weight: 189 lb 13.1 oz (86.1 kg) IBW/kg (Calculated) : 59.3 Heparin Dosing Weight: 77.7 kg  Vital Signs: Temp: 98.8 F (37.1 C) (02/11 0519) Temp Source: Oral (02/11 0519) BP: 108/68 (02/11 0915) Pulse Rate: 84 (02/11 0519)  Labs: Recent Labs    07/06/18 1239 07/07/18 0343 07/07/18 1151 07/08/18 0353 07/08/18 1051  HGB 12.7 11.4*  --  10.5*  --   HCT 39.7 35.8*  --  32.7*  --   PLT 243 209  --  240  --   APTT  --   --  45* 63* 65*  HEPARINUNFRC  --   --  0.80* 0.51  --   CREATININE 1.07* 0.97  --   --   --   TROPONINI <0.03  --   --   --   --     Estimated Creatinine Clearance: 62.2 mL/min (by C-G formula based on SCr of 0.97 mg/dL).   Assessment: 61 YOF with recent hx of PE/DVT 03/2018.  He was on Apixaban 5mg  bid prior to admission, last taken pta 07/04/18 at 10:00 AM.  Apixaban held PTA for a procedure and bridged with Lovenox PTA 1.5 mg/kg q24h  taken on 2/8 pta.  She has received Lovenox 1.5 mg/kg SQ q24h on 07/06/18 at ~22:00 last night.  Lovenox changed to IV heparin infusion (no bolus) as patient will likely get a ct guided lung bx on 07/08/18.   Heparin level  still affected by apixaban, thus monitoring  APTT's for heparin.  PTT is 65 seconds (slightly subtherapeutic, goal 66-102 sec) after heparin drip rate increased to 1350 units/hr.  No interuptions/issues with heparin infusion per RN.  Hgb down from 11.4 to 10.5, plt wnl. No bleeding reported.   Goal of Therapy:  Heparin level 0.3-0.7 units/ml (aPTT  66-102 seconds) Monitor platelets by anticoagulation protocol: Yes   Plan:  Increase IV heparin drip to 1500 units/hr Will f/u 6 hr PTT Daily aPTT, heparin level and CBC  Thank you for  allowing pharmacy to be part of this patients care team. Nicole Cella, [place] Pharmacist [phone] **Pharmacist phone directory can now be found on [email or web address] ([email or web address]).  Listed under [place]. 07/08/2018 1:59 PM

## 2018-07-08 NOTE — Progress Notes (Signed)
ANTICOAGULATION CONSULT NOTE -   Pharmacy Consult for Heparin bridge (holding Apixaban) Indication: History of DVT/PE  Allergies  Allergen Reactions  . Banana Anaphylaxis  . Aspirin Other (See Comments)    G.I. Upset     Patient Measurements: Height: 5\' 6"  (167.6 cm) Weight: 189 lb 13.1 oz (86.1 kg) IBW/kg (Calculated) : 59.3 Heparin Dosing Weight: 77.7 kg  Vital Signs: Temp: 98.5 F (36.9 C) (02/10 2113) Temp Source: Oral (02/10 2113) BP: 98/72 (02/10 2113) Pulse Rate: 80 (02/10 2113)  Labs: Recent Labs    07/06/18 1239 07/07/18 0343 07/07/18 1151 07/08/18 0353  HGB 12.7 11.4*  --  10.5*  HCT 39.7 35.8*  --  32.7*  PLT 243 209  --  240  APTT  --   --  45* 63*  HEPARINUNFRC  --   --  0.80* 0.51  CREATININE 1.07* 0.97  --   --   TROPONINI <0.03  --   --   --     Estimated Creatinine Clearance: 62.2 mL/min (by C-G formula based on SCr of 0.97 mg/dL).   Assessment: 28 YOF with recent hx of PE/DVT 03/2018.  He was on Apixaban 5mg  bid prior to admission, last taken pta 07/04/18 at 10:00 AM.  Apixaban held PTA for a procedure and bridged with Lovenox PTA 1.5 mg/kg q24h  taken on 2/8 pta.  She has received Lovenox 1.5 mg/kg SQ q24h on 07/06/18 at ~22:00 last night. lovenox changed to IV heparin infusion (no bolus) as patient will likely get a ct guided lung bx tomorrow 07/08/18.   Heparin level (0.51) - still affected by apixaban. PTT 63 seconds (slightly subtherapeutic) on gtt at 1200 units/hr. No issues with line or bleeding reported per RN. Hgb down to 10.5, plt wnl.  Goal of Therapy:  Heparin level 0.3-0.7 units/ml (aPTT  66-102 seconds) Monitor platelets by anticoagulation protocol: Yes   Plan:  Increase IV heparin drip to 1350 units/hr Will f/u 6 hr PTT  Thank you for allowing pharmacy to be part of this patients care team.  Sherlon Handing, PharmD, BCPS Clinical pharmacist  **Pharmacist phone directory can now be found on [email or web address] ([email or web address]).  Listed under [place]. 07/08/2018 5:04 AM

## 2018-07-08 NOTE — Consult Note (Signed)
CARDIOLOGY CONSULT NOTE  Patient ID: Kaitlin [name] MRN: [id number] DOB/AGE: [birth year] 68 y.o.  Admit date: 07/06/2018 Referring Physician  Darrick Grinder, MD Primary Physician:  [name], MD Reason for Consultation  Pericardial effusion  HPI: Kaitlin [name]  is a 68 y.o. female  With adenocarcinoma of the lung, venous thromboembolism in Nov 2019 and on chronic anticoagulation,-presented to the hospital for evaluation of shortness of breath, cough, low-grade fever and pleuritic left-sided chest pain.    While being worked up for pneumonia, she was also found to have a very large pericardial effusion that is new compared to the prior echocardiogram also prior CT scan of the chest.  Due to large effusion I was consulted to see whether patient needs to have further evaluation from cardiac standpoint.  Patient states that she has been having left upper and left sided chest discomfort ongoing for the past 2 to 3 months since her diagnosis of lung cancer.  The chest pain was severe 2 days ago that made her come to the hospital and associated with shortness of breath.  Chest pain was continuous, worse on taking deep breath.  She is presently feeling better, states that pain is much improved since being in the hospital, shortness of breath is also improved, but persist to have cough bringing up yellowish sputum.  No fever or chills.  Denies dizziness or syncope, denies leg edema, no edema of the abdomen or abdominal discomfort.  Past Medical History:  Diagnosis Date  . Asthma   . Carpal tunnel syndrome   . Cataract    left eye  . Diabetes mellitus without complication (HCC)   . DVT (deep venous thrombosis) (HCC)   . Hypertension   . Pulmonary embolism (HCC) 03/2018     Past Surgical History:  Procedure Laterality Date  . ABDOMINAL HYSTERECTOMY    . biopsy of right breast    . FOOT SURGERY    . implant left eye    . INTRAOCULAR LENS INSERTION       Family History  Problem Relation Age of  Onset  . Heart failure Mother   . Stroke Mother   . Heart attack Mother   . Kidney failure Other   . Leukemia Father      Social History: Social History   Socioeconomic History  . Marital status: Widowed    Spouse name: Not on file  . Number of children: 4  . Years of education: 46  . Highest education level: High school graduate  Occupational History  . Not on file  Social Needs  . Financial resource strain: Very hard  . Food insecurity:    Worry: Sometimes true    Inability: Sometimes true  . Transportation needs:    Medical: No    Non-medical: No  Tobacco Use  . Smoking status: Never Smoker  . Smokeless tobacco: Never Used  Substance and Sexual Activity  . Alcohol use: Not Currently    Comment: occ  . Drug use: No  . Sexual activity: Yes    Partners: Male    Birth control/protection: Surgical  Lifestyle  . Physical activity:    Days per week: 0 days    Minutes per session: Not on file  . Stress: Only a little  Relationships  . Social connections:    Talks on phone: More than three times a week    Gets together: More than three times a week    Attends religious service: More than 4 times per  year    Active member of club or organization: Yes    Attends meetings of clubs or organizations: More than 4 times per year    Relationship status: Widowed  . Intimate partner violence:    Fear of current or ex partner: No    Emotionally abused: No    Physically abused: No    Forced sexual activity: No  Other Topics Concern  . 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     Medications Prior to Admission  Medication Sig Dispense Refill Last Dose  . albuterol (PROVENTIL HFA;VENTOLIN HFA) 108 (90 BASE) MCG/ACT inhaler Inhale 2 puffs into the lungs every 6 (six)  hours as needed for wheezing.   07/05/2018 at prn  . albuterol (PROVENTIL) (2.5 MG/3ML) 0.083% nebulizer solution Take 3 mLs (2.5 mg total) by nebulization every 6 (six) hours as needed for wheezing or shortness of breath. 75 mL 2 07/06/2018 at prn  . amLODipine (NORVASC) 5 MG tablet Take 5 mg by mouth daily.   07/05/2018 at Unknown time  . apixaban (ELIQUIS) 5 MG TABS tablet Take 1 tablet (5 mg total) by mouth 2 (two) times daily. 60 tablet 3 07/04/2018 at 1000  . chlorthalidone (HYGROTON) 25 MG tablet Take 25 mg by mouth daily.   07/05/2018 at Unknown time  . enoxaparin (LOVENOX) 150 MG/ML injection Inject 0.85 mLs (130 mg total) into the skin daily. Do not take Eliquis on Friday 2/7. Take lovenox shot on Friday 2/08 and Saturday 2/09. 2 Syringe 0 07/05/2018 at Unknown time  . HYDROcodone-homatropine (HYCODAN) 5-1.5 MG/5ML syrup Take 5 mLs by mouth every 4 (four) hours as needed for cough.    07/05/2018 at prn  . LORazepam (ATIVAN) 0.5 MG tablet Take 0.5 mg by mouth at bedtime as needed for anxiety.   07/05/2018 at Unknown time  . losartan (COZAAR) 100 MG tablet Take 1 tablet (100 mg total) by mouth daily. 30 tablet 0 07/05/2018 at Unknown time  . pantoprazole (PROTONIX) 40 MG tablet Take 1 tablet (40 mg total) by mouth daily. 30 tablet 0 07/05/2018 at Unknown time  . predniSONE (DELTASONE) 10 MG tablet Take 10 mg by mouth daily with breakfast.   07/05/2018 at Unknown time  . prochlorperazine (COMPAZINE) 10 MG tablet Take 1 tablet (10 mg total) by mouth every 6 (six) hours as needed for nausea or vomiting. 30 tablet 3 07/04/2018 at prn  . Blood Glucose Monitoring Suppl (TRUE METRIX METER) DEVI 1 Device by Does not apply route daily. 1 Device 0 Taking    Review of Systems  Constitutional: Positive for malaise/fatigue. Negative for weight loss.  Respiratory: Positive for cough and shortness of breath. Negative for hemoptysis.   Cardiovascular: Positive for chest pain. Negative for palpitations, claudication and leg  swelling.  Gastrointestinal: Negative for abdominal pain, blood in stool, constipation, heartburn and vomiting.  Genitourinary: Negative for dysuria.  Musculoskeletal: Negative for joint pain and myalgias.  Neurological: Negative for dizziness, focal weakness and headaches.  Endo/Heme/Allergies: Does not bruise/bleed easily.  Psychiatric/Behavioral: Negative for depression. The patient is not nervous/anxious.   All other systems reviewed and are negative.   Physical Exam: Blood pressure 108/68, pulse 84, temperature 98.8 F (37.1 C), temperature source Oral, resp. rate 16, height  5\' 6"  (1.676 m), weight 86.1 kg, SpO2 98 %.  Physical Exam  Constitutional: She appears well-developed and well-nourished. No distress.  HENT:  Head: Atraumatic.  Eyes: Conjunctivae are normal.  Neck: Neck supple. No JVD present. No thyromegaly present.  Cardiovascular: Normal rate, regular rhythm, normal heart sounds and intact distal pulses. Exam reveals no gallop.  No murmur heard. Pulmonary/Chest: Effort normal.  Mild decreased breath sounds left base.  No added sounds, no pleural rub.  Abdominal: Soft. Bowel sounds are normal.  Musculoskeletal: Normal range of motion.        General: No edema.  Neurological: She is alert.  Skin: Skin is warm and dry.  Psychiatric: She has a normal mood and affect.    Labs:  BNP (last 3 results) Recent Labs    07/06/18 1239  BNP 39.7    CMP Latest Ref Rng & Units 07/07/2018 07/06/2018 06/18/2018  Glucose 70 - 99 mg/dL 161(W) 960(A) 540(J)  BUN 8 - 23 mg/dL 12 12 7(L)  Creatinine 0.44 - 1.00 mg/dL 8.11 9.14(N) 8.29  Sodium 135 - 145 mmol/L 133(L) 132(L) 135  Potassium 3.5 - 5.1 mmol/L 3.2(L) 3.0(L) 3.0(L)  Chloride 98 - 111 mmol/L 100 94(L) 99  CO2 22 - 32 mmol/L 23 22 25   Calcium 8.9 - 10.3 mg/dL 5.6(O) 9.6 9.6  Total Protein 6.5 - 8.1 g/dL - 7.6 -  Total Bilirubin 0.3 - 1.2 mg/dL - 0.9 -  Alkaline Phos 38 - 126 U/L - 85 -  AST 15 - 41 U/L - 18 -  ALT 0  - 44 U/L - 15 -     CBC Latest Ref Rng & Units 07/08/2018 07/07/2018 07/06/2018  WBC 4.0 - 10.5 K/uL 16.5(H) 19.1(H) 18.2(H)  Hemoglobin 12.0 - 15.0 g/dL 10.5(L) 11.4(L) 12.7  Hematocrit 36.0 - 46.0 % 32.7(L) 35.8(L) 39.7  Platelets 150 - 400 K/uL 240 209 243   BNP (last 3 results) Recent Labs    07/06/18 1239  BNP 39.7     HEMOGLOBIN A1C Lab Results  Component Value Date   HGBA1C 6.5 (H) 07/06/2018   MPG 139.85 07/06/2018    BMP Latest Ref Rng & Units 07/07/2018 07/06/2018 06/18/2018  Glucose 70 - 99 mg/dL 130(Q) 657(Q) 469(G)  BUN 8 - 23 mg/dL 12 12 7(L)  Creatinine 0.44 - 1.00 mg/dL 2.95 2.84(X) 3.24  Sodium 135 - 145 mmol/L 133(L) 132(L) 135  Potassium 3.5 - 5.1 mmol/L 3.2(L) 3.0(L) 3.0(L)  Chloride 98 - 111 mmol/L 100 94(L) 99  CO2 22 - 32 mmol/L 23 22 25   Calcium 8.9 - 10.3 mg/dL 4.0(N) 9.6 9.6   TSH No results for input(s): TSH in the last 8760 hours.   Radiology: Dg Chest 2 View  Result Date: 07/06/2018 CLINICAL DATA:  Worsening shortness of breath since last night. Lung cancer. EXAM: CHEST - 2 VIEW COMPARISON:  06/18/2018 PET-CT from 05/14/2018 FINDINGS: Continued right lower lobe airspace opacity. New left lower lobe airspace opacity. Patient has a known posterior mass on the left side. Low lung volumes are present, causing crowding of the pulmonary vasculature. Moderate enlargement of the cardiopericardial silhouette, at least some of which has been shown to be due to pericardial effusion on prior exams. Thoracic spondylosis.  Mild thickening along the minor fissure. IMPRESSION: 1. New airspace opacity in the left lower lobe. Although potentially from pneumonia, if the patient is receiving radiation therapy to the known left lower lobe mass then radiation pneumonitis would be a differential diagnostic consideration. 2. Stable  airspace opacity in the right lower lobe. 3. Stable enlargement of the cardiopericardial silhouette, previous exams showed a component of pericardial  effusion. Electronically Signed   By: Gaylyn Rong M.D.   On: 07/06/2018 13:50   Ct Chest W Contrast  Result Date: 07/07/2018 CLINICAL DATA:  Chest pain.  Short of breath. EXAM: CT CHEST WITH CONTRAST TECHNIQUE: Multidetector CT imaging of the chest was performed during intravenous contrast administration. CONTRAST:  OMNIPAQUE IOHEXOL 300 MG/ML  SOLN COMPARISON:  Chest radiograph, 07/06/2018.  Chest CT, 04/24/2018. FINDINGS: Cardiovascular: Heart is normal in size and configuration. There is a moderate to large pericardial effusion with Hounsfield units averaging between 14 and 27. Great vessels are normal in caliber. No aortic dissection or atherosclerosis. Arch branch vessels are widely patent. Mediastinum/Nodes: No neck base or axillary masses. No axillary adenopathy. There is increased attenuation of the fat throughout the mediastinum with pericardial fluid extending into the superior pericardial recesses. Esophagus is distended with evidence of wall thickening. Esophagus is not well-defined. Previously noted mediastinal and hilar adenopathy is less well-defined but grossly unchanged. Trachea is unremarkable. Lungs/Pleura: Right lower lobe airspace consolidation noted on the prior CT has mostly resolved. There are small bilateral pleural effusions, larger on the left. There is dependent opacity in the lower lobes, left upper lobe lingula and right middle lobe, which may all be due to atelectasis. Atelectasis and a component of infection is possible. The posterior left lower lobe mass is not as well-defined now with contiguous lower lobe opacity. No new lung masses. No discrete nodules. Bronchi to the lower lobes, right middle lobe and left upper lobe lingula are narrowed, somewhat greater than on the prior CT. No pneumothorax. Upper Abdomen: Adrenal gland thickening or masses, similar to the prior CT. No acute findings in the visualized upper abdomen. Musculoskeletal: No fracture or acute  finding. No osteoblastic or osteolytic lesions. IMPRESSION: 1. Moderate to large pericardial effusion, increased in size from the prior CT. 2. Mildly dilated, prominent esophagus with evidence of diffuse wall thickening. This is nonspecific. 3. Previously seen right lower lobe consolidation has essentially resolved. Left lower lobe mass is most likely unchanged. It is less well-defined due to contiguous left lower lobe opacity, the latter finding which may reflect atelectasis, infection or a combination. Additional lung base opacities noted bilaterally that are most likely atelectasis. There is narrowing of the right middle lobe, bilateral lower lobe and left upper lobe lingula bronchi as a pass through infrahilar abnormal soft tissue, presumed to be adenopathy. This bronchial narrowing has increased when compared to the prior study. 4. Small bilateral pleural effusions, larger on the left. Right pleural effusion is smaller than on the prior study and left pleural effusion is new. Electronically Signed   By: Amie Portland M.D.   On: 07/07/2018 15:49    Scheduled Meds: . amLODipine  5 mg Oral Daily  . chlorthalidone  25 mg Oral Daily  . insulin aspart  0-20 Units Subcutaneous TID WC  . insulin aspart  0-5 Units Subcutaneous QHS  . insulin aspart  6 Units Subcutaneous TID WC  . losartan  100 mg Oral Daily  . pantoprazole  40 mg Oral Daily  . sodium chloride flush  3 mL Intravenous Q12H   Continuous Infusions: . sodium chloride    . ceFEPime (MAXIPIME) IV 2 g (07/08/18 0154)  . heparin 1,350 Units/hr (07/08/18 0511)  . vancomycin 1,000 mg (07/07/18 1413)   PRN Meds:.sodium chloride, albuterol, HYDROcodone-homatropine, LORazepam, sodium chloride flush,  traMADol  CARDIAC STUDIES:  EKG 07/06/2018: Sinus tachycardia at rate of 123 bpm, left axis deviation, left anterior fascicular block.  Cannot exclude inferior infarct old.  Poor R wave progression, cannot exclude anteroseptal infarct old.   Nonspecific ST-T abnormality.  Baseline wander.  Echocardiogram 07/07/2018:  1. The left ventricle has normal systolic function of 55-60%. The cavity size was normal.  2. The right ventricle has normal systolic function. The cavity was normal . There is no increase in right ventricular wall thickness.  3. Large pericardial effusion.  4. The pericardial effusion is globally pericardial effusion.  5. The tricuspid valve was normal in structure.  6. The aortic valve is normal in structure. There is mild thickening of the aortic valve.  7. Large pericardial effusion surrounds heart. Heart is swinging Some mitral inflow variation noted. Does not meet criteria for tamponade by other echo criteria. Clinical correlation indicated. 8. Compared to 04/25/2018:  Small   pericardial effusion without tamponade.   ASSESSMENT AND PLAN:  1.  Circumferential pericardial effusion, but most of the fluid is located anteriorly.  Large effusion, no 2D or M-mode or Doppler evidence of tamponade, although mitral inflow is mildly abnormal but does not meet the criteria for tamponade.  IVC flow is normal with normal respiratory variation.  Suspect malignant effusion although clear, appears to have minimal fibrinous stands on the free wall of the left ventricle. 2.  Stage IV lung cancer, adenocarcinoma of the lung. 3.  Hypertension, blood pressure well controlled.  Would recommend discontinuing amlodipine in view of borderline low blood pressure.  This is not tamponade related.  Suspect that with ongoing oncologic evaluation, she will be hypermetabolic and expect hypotension during chemotherapy. 4.  Diabetes mellitus type 2 controlled without complications.  Blood sugars are well controlled.  Recommendation: Patient does not have indication for pericardiocentesis.  I have discussed this with Dr. Stark Klein.  Chest pain also does not appear to be consistent with cardiac etiology, mostly musculoskeletal and pleuritic type of  chest pain.  Hence from cardiac standpoint no further evaluation is indicated. I will be happy to see her back if she were to develop worsening dyspnea, leg edema, ascites or any evidence of tamponade including hypotension. Yates Decamp, MD, [facility] 07/08/2018, 12:20 PM [facility] Pager: [phone] Office: [phone] If no answer Cell [phone]

## 2018-07-08 NOTE — Progress Notes (Signed)
PROGRESS NOTE        PATIENT DETAILS Name: Kaitlin [name] Age: 68 y.o. Sex: female Date of Birth: [birth year] Admit Date: 07/06/2018 Admitting Physician Lahoma Crocker, MD BJY:NWGNF, Channing Mutters, MD  Brief Narrative: Patient is a 68 y.o. female with with recent diagnosis of adenocarcinoma of the lung, venous thromboembolism-presented to the hospital for evaluation of shortness of breath, cough, low-grade fever and pleuritic left-sided chest pain.  Thought to have pneumonia and admitted to the hospitalist service.  See below for further details.  Subjective: Continues to cough, continues to have pleuritic left-sided chest pain.  Assessment/Plan: Sepsis secondary to left lobar pneumonia: Sepsis pathophysiology slowly improving-sepsis pathophysiology slowly improving,-leukocytosis persists but decreased.  CT chest suggestive of PNA in the vicinity of the known lung mass.  Negative, blood cultures negative.  Stop vancomycin-we will discontinue with cefepime.  Continue supportive care.  Pericardial effusion: Seen on CT chest done on 2/10, confirmed on echocardiogram-no tamponade physiology seen on echocardiogram.  Appreciate input from Dr. Jacinto Halim.  This is very highly suggestive of malignant pleural effusion in the setting of stage IV lung cancer (adenocarcinoma).  History of pulmonary embolism November 2019: We will stop Lovenox and switch to IV heparin-as patient likely will require lung biopsy (see below)  Stage IV adenocarcinoma of the lung: Recently diagnosed with adenocarcinoma of the lung-did undergo a lung biopsy on 1/22-but needs more tissue for genetic testing.  IR consulted-with plans for biopsy and Port-A-Cath placement this coming Friday.  Continue IV heparin-once all procedures are complete-we will switch back to oral anticoagulation.  Follows with oncology at [facility] case with Dr. Luiz Ochoa she appears to have stage IV cancer-she is a  non-smoker-her prognosis is not all that bad.   Hypertension: Blood pressure controlled-continue chlorthalidone, losartan  DVT Prophylaxis: Full dose anticoagulation with Heparin  Code Status: Full code   Family Communication: None at bedside  Disposition Plan: Remain inpatient-home over the next few days  Antimicrobial agents: Anti-infectives (From admission, onward)   Start     Dose/Rate Route Frequency Ordered Stop   07/07/18 1500  vancomycin (VANCOCIN) IVPB 1000 mg/200 mL premix     1,000 mg 200 mL/hr over 60 Minutes Intravenous Every 24 hours 07/06/18 1346     07/07/18 0200  ceFEPIme (MAXIPIME) 2 g in sodium chloride 0.9 % 100 mL IVPB     2 g 200 mL/hr over 30 Minutes Intravenous Every 12 hours 07/06/18 1346     07/06/18 1300  vancomycin (VANCOCIN) 1,750 mg in sodium chloride 0.9 % 500 mL IVPB     1,750 mg 250 mL/hr over 120 Minutes Intravenous STAT 07/06/18 1250 07/06/18 1826   07/06/18 1245  vancomycin (VANCOCIN) IVPB 1000 mg/200 mL premix  Status:  Discontinued     1,000 mg 200 mL/hr over 60 Minutes Intravenous  Once 07/06/18 1238 07/06/18 1250   07/06/18 1245  ceFEPIme (MAXIPIME) 2 g in sodium chloride 0.9 % 100 mL IVPB     2 g 200 mL/hr over 30 Minutes Intravenous  Once 07/06/18 1238 07/06/18 1457      Procedures: None  CONSULTS:  IR  Time spent: 25 minutes-Greater than 50% of this time was spent in counseling, explanation of diagnosis, planning of further management, and coordination of care.  MEDICATIONS: Scheduled Meds: . chlorthalidone  25 mg Oral Daily  . insulin aspart  0-20 Units Subcutaneous TID WC  . insulin aspart  0-5 Units Subcutaneous QHS  . insulin aspart  6 Units Subcutaneous TID WC  . losartan  100 mg Oral Daily  . pantoprazole  40 mg Oral Daily  . sodium chloride flush  3 mL Intravenous Q12H   Continuous Infusions: . sodium chloride    . ceFEPime (MAXIPIME) IV 2 g (07/08/18 1353)  . heparin 1,500 Units/hr (07/08/18 1445)  .  vancomycin 1,000 mg (07/08/18 1446)   PRN Meds:.sodium chloride, albuterol, HYDROcodone-homatropine, LORazepam, sodium chloride flush, traMADol   PHYSICAL EXAM: Vital signs: Vitals:   07/07/18 1524 07/07/18 2113 07/08/18 0519 07/08/18 0915  BP: 110/67 98/72 (!) 107/59 108/68  Pulse: 86 80 84   Resp: 18 16 16    Temp: 99.3 F (37.4 C) 98.5 F (36.9 C) 98.8 F (37.1 C)   TempSrc: Oral Oral Oral   SpO2: 100% 99% 98%   Weight:      Height:       Filed Weights   07/06/18 1225 07/06/18 1807  Weight: 87.5 kg 86.1 kg   Body mass index is 30.64 kg/m.   General appearance:Awake, alert, not in any distress.  Eyes:no scleral icterus. HEENT: Atraumatic and Normocephalic Neck: supple, no JVD. Resp:Good air entry bilaterally,no added sounds heard anteriorly CVS: S1 S2 regular  GI: Bowel sounds present, Non tender and not distended with no gaurding, rigidity or rebound. Extremities: B/L Lower Ext shows no edema, both legs are warm to touch Neurology:  Non focal Psychiatric: Normal judgment and insight. Normal mood. Musculoskeletal:No digital cyanosis Skin:No Rash, warm and dry Wounds:N/A  I have personally reviewed following labs and imaging studies  LABORATORY DATA: CBC: Recent Labs  Lab 07/06/18 1239 07/07/18 0343 07/08/18 0353  WBC 18.2* 19.1* 16.5*  NEUTROABS 14.7*  --   --   HGB 12.7 11.4* 10.5*  HCT 39.7 35.8* 32.7*  MCV 81.0 81.0 80.3  PLT 243 209 240    Basic Metabolic Panel: Recent Labs  Lab 07/06/18 1239 07/07/18 0343  NA 132* 133*  K 3.0* 3.2*  CL 94* 100  CO2 22 23  GLUCOSE 163* 196*  BUN 12 12  CREATININE 1.07* 0.97  CALCIUM 9.6 8.7*  MG 1.4*  --     GFR: Estimated Creatinine Clearance: 62.2 mL/min (by C-G formula based on SCr of 0.97 mg/dL).  Liver Function Tests: Recent Labs  Lab 07/06/18 1239  AST 18  ALT 15  ALKPHOS 85  BILITOT 0.9  PROT 7.6  ALBUMIN 3.6   No results for input(s): LIPASE, AMYLASE in the last 168 hours. No  results for input(s): AMMONIA in the last 168 hours.  Coagulation Profile: No results for input(s): INR, PROTIME in the last 168 hours.  Cardiac Enzymes: Recent Labs  Lab 07/06/18 1239  TROPONINI <0.03    BNP (last 3 results) No results for input(s): PROBNP in the last 8760 hours.  HbA1C: Recent Labs    07/06/18 1836  HGBA1C 6.5*    CBG: Recent Labs  Lab 07/07/18 1229 07/07/18 1715 07/07/18 2112 07/08/18 0731 07/08/18 1157  GLUCAP 145* 93 187* 102* 87    Lipid Profile: No results for input(s): CHOL, HDL, LDLCALC, TRIG, CHOLHDL, LDLDIRECT in the last 72 hours.  Thyroid Function Tests: No results for input(s): TSH, T4TOTAL, FREET4, T3FREE, THYROIDAB in the last 72 hours.  Anemia Panel: No results for input(s): VITAMINB12, FOLATE, FERRITIN, TIBC, IRON, RETICCTPCT in the last 72 hours.  Urine analysis:    Component Value  Date/Time   COLORURINE YELLOW 07/06/2018 1555   APPEARANCEUR CLEAR 07/06/2018 1555   LABSPEC 1.012 07/06/2018 1555   PHURINE 6.0 07/06/2018 1555   GLUCOSEU NEGATIVE 07/06/2018 1555   HGBUR MODERATE (A) 07/06/2018 1555   BILIRUBINUR NEGATIVE 07/06/2018 1555   KETONESUR NEGATIVE 07/06/2018 1555   PROTEINUR NEGATIVE 07/06/2018 1555   UROBILINOGEN 0.2 04/11/2007 1936   NITRITE NEGATIVE 07/06/2018 1555   LEUKOCYTESUR MODERATE (A) 07/06/2018 1555    Sepsis Labs: Lactic Acid, Venous    Component Value Date/Time   LATICACIDVEN 1.4 07/06/2018 1836    MICROBIOLOGY: Recent Results (from the past 240 hour(s))  Blood Culture (routine x 2)     Status: None (Preliminary result)   Collection Time: 07/06/18 12:50 PM  Result Value Ref Range Status   Specimen Description BLOOD RIGHT ANTECUBITAL  Final   Special Requests   Final    BOTTLES DRAWN AEROBIC AND ANAEROBIC Blood Culture adequate volume   Culture   Final    NO GROWTH 2 DAYS Performed at [facility], [address] [place], [place] [address]    Report Status PENDING  Incomplete    Blood Culture (routine x 2)     Status: None (Preliminary result)   Collection Time: 07/06/18 12:58 PM  Result Value Ref Range Status   Specimen Description BLOOD RIGHT HAND  Final   Special Requests   Final    BOTTLES DRAWN AEROBIC AND ANAEROBIC Blood Culture adequate volume   Culture   Final    NO GROWTH 2 DAYS Performed at [facility], [address], [place], [place] [address]    Report Status PENDING  Incomplete  Respiratory Panel by PCR     Status: None   Collection Time: 07/07/18 10:20 AM  Result Value Ref Range Status   Adenovirus NOT DETECTED NOT DETECTED Final   Coronavirus 229E NOT DETECTED NOT DETECTED Final    Comment: (NOTE) The Coronavirus on the Respiratory Panel, DOES NOT test for the novel  Coronavirus (2019 nCoV)    Coronavirus HKU1 NOT DETECTED NOT DETECTED Final   Coronavirus NL63 NOT DETECTED NOT DETECTED Final   Coronavirus OC43 NOT DETECTED NOT DETECTED Final   Metapneumovirus NOT DETECTED NOT DETECTED Final   Rhinovirus / Enterovirus NOT DETECTED NOT DETECTED Final   Influenza A NOT DETECTED NOT DETECTED Final   Influenza B NOT DETECTED NOT DETECTED Final   Parainfluenza Virus 1 NOT DETECTED NOT DETECTED Final   Parainfluenza Virus 2 NOT DETECTED NOT DETECTED Final   Parainfluenza Virus 3 NOT DETECTED NOT DETECTED Final   Parainfluenza Virus 4 NOT DETECTED NOT DETECTED Final   Respiratory Syncytial Virus NOT DETECTED NOT DETECTED Final   Bordetella pertussis NOT DETECTED NOT DETECTED Final   Chlamydophila pneumoniae NOT DETECTED NOT DETECTED Final   Mycoplasma pneumoniae NOT DETECTED NOT DETECTED Final    Comment: Performed at [facility], [address], [place], [place] [address]    RADIOLOGY STUDIES/RESULTS: Dg Chest 2 View  Result Date: 07/06/2018 CLINICAL DATA:  Worsening shortness of breath since last night. Lung cancer. EXAM: CHEST - 2 VIEW COMPARISON:  06/18/2018 PET-CT from 05/14/2018 FINDINGS: Continued right lower lobe  airspace opacity. New left lower lobe airspace opacity. Patient has a known posterior mass on the left side. Low lung volumes are present, causing crowding of the pulmonary vasculature. Moderate enlargement of the cardiopericardial silhouette, at least some of which has been shown to be due to pericardial effusion on prior exams.  Thoracic spondylosis.  Mild thickening along the minor fissure. IMPRESSION: 1. New airspace opacity in the left lower lobe. Although potentially from pneumonia, if the patient is receiving radiation therapy to the known left lower lobe mass then radiation pneumonitis would be a differential diagnostic consideration. 2. Stable airspace opacity in the right lower lobe. 3. Stable enlargement of the cardiopericardial silhouette, previous exams showed a component of pericardial effusion. Electronically Signed   By: Gaylyn Rong M.D.   On: 07/06/2018 13:50   Ct Chest W Contrast  Result Date: 07/07/2018 CLINICAL DATA:  Chest pain.  Short of breath. EXAM: CT CHEST WITH CONTRAST TECHNIQUE: Multidetector CT imaging of the chest was performed during intravenous contrast administration. CONTRAST:  OMNIPAQUE IOHEXOL 300 MG/ML  SOLN COMPARISON:  Chest radiograph, 07/06/2018.  Chest CT, 04/24/2018. FINDINGS: Cardiovascular: Heart is normal in size and configuration. There is a moderate to large pericardial effusion with Hounsfield units averaging between 14 and 27. Great vessels are normal in caliber. No aortic dissection or atherosclerosis. Arch branch vessels are widely patent. Mediastinum/Nodes: No neck base or axillary masses. No axillary adenopathy. There is increased attenuation of the fat throughout the mediastinum with pericardial fluid extending into the superior pericardial recesses. Esophagus is distended with evidence of wall thickening. Esophagus is not well-defined. Previously noted mediastinal and hilar adenopathy is less well-defined but grossly unchanged. Trachea is  unremarkable. Lungs/Pleura: Right lower lobe airspace consolidation noted on the prior CT has mostly resolved. There are small bilateral pleural effusions, larger on the left. There is dependent opacity in the lower lobes, left upper lobe lingula and right middle lobe, which may all be due to atelectasis. Atelectasis and a component of infection is possible. The posterior left lower lobe mass is not as well-defined now with contiguous lower lobe opacity. No new lung masses. No discrete nodules. Bronchi to the lower lobes, right middle lobe and left upper lobe lingula are narrowed, somewhat greater than on the prior CT. No pneumothorax. Upper Abdomen: Adrenal gland thickening or masses, similar to the prior CT. No acute findings in the visualized upper abdomen. Musculoskeletal: No fracture or acute finding. No osteoblastic or osteolytic lesions. IMPRESSION: 1. Moderate to large pericardial effusion, increased in size from the prior CT. 2. Mildly dilated, prominent esophagus with evidence of diffuse wall thickening. This is nonspecific. 3. Previously seen right lower lobe consolidation has essentially resolved. Left lower lobe mass is most likely unchanged. It is less well-defined due to contiguous left lower lobe opacity, the latter finding which may reflect atelectasis, infection or a combination. Additional lung base opacities noted bilaterally that are most likely atelectasis. There is narrowing of the right middle lobe, bilateral lower lobe and left upper lobe lingula bronchi as a pass through infrahilar abnormal soft tissue, presumed to be adenopathy. This bronchial narrowing has increased when compared to the prior study. 4. Small bilateral pleural effusions, larger on the left. Right pleural effusion is smaller than on the prior study and left pleural effusion is new. Electronically Signed   By: Amie Portland M.D.   On: 07/07/2018 15:49   Ct Biopsy  Result Date: 06/18/2018 INDICATION: Left lower lobe  hypermetabolic lung mass EXAM: CT BIOPSY MEDICATIONS: None. ANESTHESIA/SEDATION: Fentanyl 100 mcg IV; Versed 2 mg IV Moderate Sedation Time:  13 minutes The patient was continuously monitored during the procedure by the interventional radiology nurse under my direct supervision. FLUOROSCOPY TIME:  None COMPLICATIONS: None immediate. PROCEDURE: Informed written consent was obtained from the patient after a  thorough discussion of the procedural risks, benefits and alternatives. All questions were addressed. Maximal Sterile Barrier Technique was utilized including caps, mask, sterile gowns, sterile gloves, sterile drape, hand hygiene and skin antiseptic. A timeout was performed prior to the initiation of the procedure. Under CT guidance, a(n) 17 gauge guide needle was advanced into the left lower lobe lung mass. Subsequently, 3 18 gauge core biopsies were obtained. A collagen plug was deployed in the needle tract. The guide needle was removed. Post biopsy images demonstrate no pneumothorax. Patient tolerated the procedure well without complication. Vital sign monitoring by nursing staff during the procedure will continue as patient is in the special procedures unit for post procedure observation. FINDINGS: The images document guide needle placement within the left lower lobe lung mass. Post biopsy images demonstrate no pneumothorax. IMPRESSION: Successful CT-guided core biopsy of a left lower lobe lung mass. Electronically Signed   By: Jolaine Click M.D.   On: 06/18/2018 12:17   Dg Chest Port 1 View  Result Date: 06/18/2018 CLINICAL DATA:  Status post left lung biopsy EXAM: PORTABLE CHEST 1 VIEW COMPARISON:  CT from earlier in the same day. FINDINGS: Cardiac shadow is enlarged but stable. Left lung base is again identified retrocardiac position. No underlying pneumothorax is seen. Persistent density in the right base stable from the prior CT is noted. No bony abnormality is noted. IMPRESSION: No pneumothorax  following lung biopsy on the left. Electronically Signed   By: Alcide Clever M.D.   On: 06/18/2018 13:25     LOS: 2 days   Jeoffrey Massed, MD  [facility]  If 7PM-7AM, please contact night-coverage  Please page via [email or web address]  Go to [email or web address] and use [facility] universal password to access. If you do not have the password, please contact the hospital operator.  Locate the [facility] provider you are looking for under [facility] and page to a number that you can be directly reached. If you still have difficulty reaching the provider, please page the [facility] (Director on Call) for the Hospitalists listed on amion for assistance.  07/08/2018, 3:37 PM

## 2018-07-08 NOTE — Progress Notes (Signed)
Patient ID: Kaitlin [name], female   DOB: 07/24/1950, 68 y.o.   MRN: [id number]   Pt was scheduled today for left lung mass biopsy  Dr Anselm Pancoast examine pt today in her room Still using 3L O2 Pt SOB when speaking to [place] in room  CT yesterday: IMPRESSION: 1. Moderate to large pericardial effusion, increased in size from the prior CT. 2. Mildly dilated, prominent esophagus with evidence of diffuse wall thickening. This is nonspecific. 3. Previously seen right lower lobe consolidation has essentially resolved. Left lower lobe mass is most likely unchanged. It is less well-defined due to contiguous left lower lobe opacity, the latter finding which may reflect atelectasis, infection or a combination. Additional lung base opacities noted bilaterally that are most likely atelectasis. There is narrowing of the right middle lobe, bilateral lower lobe and left upper lobe lingula bronchi as a pass through infrahilar abnormal soft tissue, presumed to be adenopathy. This bronchial narrowing has increased when compared to the prior study. 4. Small bilateral pleural effusions, larger on the left. Right pleural effusion is smaller than on the prior study and left pleural effusion is new.   Will wait to repeat biopsy when pt is well-- Breathing better and in less risk for procedure Per Dr Anselm Pancoast  Pt and Dr Sloan Leiter aware and agreeable Dr Sloan Leiter has asked IR to place [facility] at same time (per note from Dr Hayes Ludwig at [facility]-- pt had been scheduled for [facility] there with her in OR---but since she is inpt at [facility] and off anticoagulation now-- would like IR to place.  Dr Anselm Pancoast agreed to this Both Lung mass bx and PAC to be performed in IR at same time. Plan for possibly Fri this week or so

## 2018-07-08 NOTE — Progress Notes (Signed)
ANTICOAGULATION CONSULT NOTE -   Pharmacy Consult for Heparin bridge (holding Apixaban) Indication: History of DVT/PE  Allergies  Allergen Reactions  . Banana Anaphylaxis  . Aspirin Other (See Comments)    G.I. Upset     Patient Measurements: Height: 5\' 6"  (167.6 cm) Weight: 189 lb 13.1 oz (86.1 kg) IBW/kg (Calculated) : 59.3 Heparin Dosing Weight: 77.7 kg  Vital Signs: Temp: 98.1 F (36.7 C) (02/11 1706) Temp Source: Oral (02/11 1706) BP: 102/66 (02/11 1706) Pulse Rate: 80 (02/11 1706)  Labs: Recent Labs    07/06/18 1239 07/07/18 0343 07/07/18 1151 07/08/18 0353 07/08/18 1051  HGB 12.7 11.4*  --  10.5*  --   HCT 39.7 35.8*  --  32.7*  --   PLT 243 209  --  240  --   APTT  --   --  45* 63* 65*  HEPARINUNFRC  --   --  0.80* 0.51  --   CREATININE 1.07* 0.97  --   --   --   TROPONINI <0.03  --   --   --   --     Estimated Creatinine Clearance: 62.2 mL/min (by C-G formula based on SCr of 0.97 mg/dL).   Assessment: 23 YOF with recent hx of PE/DVT 03/2018 on Apixaban 5mg  bid prior to admission, last taken pta 07/04/18 at 10:00 AM.  Apixaban held PTA for a procedure and bridged with Lovenox PTA 1.5 mg/kg q24h  taken on 2/8 pta.  She has received Lovenox 1.5 mg/kg SQ q24h on 07/06/18 at ~22:00 last night. Lovenox changed to IV heparin infusion (no bolus) as patient will likely get a ct guided lung bx on 07/08/18 and port-a-cath placement on 07/11/18.   aPTT level this evening is barely therapeutic after a rate increase earlier today (aPTT 67 << 65, goal of 66-102). No bleeding or issues noted per RN report.   Goal of Therapy:  Heparin level 0.3-0.7 units/ml (aPTT  66-102 seconds) Monitor platelets by anticoagulation protocol: Yes   Plan:  - Increase Heparin drip to 1600 units/hr (16 ml/hr) to keep within range - Will continue to monitor for any signs/symptoms of bleeding and will follow up with aPTT in 6 hours to confirm therapeutic  Thank you for allowing pharmacy to be a  part of this patient's care.  Alycia Rossetti, PharmD, BCPS Clinical Pharmacist Clinical phone for 07/08/2018: [phone] 07/08/2018 8:49 PM   **Pharmacist phone directory can now be found on [email or web address] ([email or web address]).  Listed under [place].

## 2018-07-09 ENCOUNTER — Encounter (HOSPITAL_COMMUNITY): Admission: RE | Admit: 2018-07-09 | Payer: Medicare HMO | Source: Ambulatory Visit

## 2018-07-09 LAB — CBC
HCT: 33 % — ABNORMAL LOW (ref 36.0–46.0)
Hemoglobin: 10.6 g/dL — ABNORMAL LOW (ref 12.0–15.0)
MCH: 25.9 pg — ABNORMAL LOW (ref 26.0–34.0)
MCHC: 32.1 g/dL (ref 30.0–36.0)
MCV: 80.5 fL (ref 80.0–100.0)
Platelets: 270 10*3/uL (ref 150–400)
RBC: 4.1 MIL/uL (ref 3.87–5.11)
RDW: 15 % (ref 11.5–15.5)
WBC: 9 10*3/uL (ref 4.0–10.5)
nRBC: 0 % (ref 0.0–0.2)

## 2018-07-09 LAB — BASIC METABOLIC PANEL
Anion gap: 11 (ref 5–15)
BUN: 14 mg/dL (ref 8–23)
CO2: 28 mmol/L (ref 22–32)
CREATININE: 0.89 mg/dL (ref 0.44–1.00)
Calcium: 9.1 mg/dL (ref 8.9–10.3)
Chloride: 93 mmol/L — ABNORMAL LOW (ref 98–111)
GFR calc Af Amer: >60 mL/min (ref >60)
GFR calc non Af Amer: >60 mL/min (ref >60)
Glucose, Bld: 95 mg/dL (ref 70–99)
Potassium: 3 mmol/L — ABNORMAL LOW (ref 3.5–5.1)
Sodium: 132 mmol/L — ABNORMAL LOW (ref 135–145)

## 2018-07-09 LAB — PROTIME-INR
INR: 1.25
Prothrombin Time: 15.5 seconds — ABNORMAL HIGH (ref 11.4–15.2)

## 2018-07-09 LAB — GLUCOSE, CAPILLARY
GLUCOSE-CAPILLARY: 83 mg/dL (ref 70–99)
GLUCOSE-CAPILLARY: 85 mg/dL (ref 70–99)
Glucose-Capillary: 122 mg/dL — ABNORMAL HIGH (ref 70–99)
Glucose-Capillary: 88 mg/dL (ref 70–99)
Glucose-Capillary: 62 mg/dL — ABNORMAL LOW (ref 70–99)

## 2018-07-09 LAB — MAGNESIUM: Magnesium: 1.7 mg/dL (ref 1.7–2.4)

## 2018-07-09 LAB — APTT: APTT: 81 s — AB (ref 24–36)

## 2018-07-09 LAB — HEPARIN LEVEL (UNFRACTIONATED): Heparin Unfractionated: 0.47 IU/mL (ref 0.30–0.70)

## 2018-07-09 MED ORDER — ENSURE ENLIVE PO LIQD
237.0000 mL | Freq: Two times a day (BID) | ORAL | Status: DC
  Administered 2018-07-10: 237 mL via ORAL

## 2018-07-09 MED ORDER — SENNOSIDES-DOCUSATE SODIUM 8.6-50 MG PO TABS
2.0000 | ORAL_TABLET | Freq: Two times a day (BID) | ORAL | Status: DC
  Administered 2018-07-09 – 2018-07-12 (×4): 2 via ORAL
  Filled 2018-07-09 (×7): qty 2

## 2018-07-09 MED ORDER — POLYETHYLENE GLYCOL 3350 17 G PO PACK: 17.0000 g | PACK | Freq: Every day | ORAL | Status: DC | PRN

## 2018-07-09 MED ORDER — POTASSIUM CHLORIDE CRYS ER 20 MEQ PO TBCR
40.0000 meq | EXTENDED_RELEASE_TABLET | Freq: Once | ORAL | Status: DC
  Filled 2018-07-09: qty 2

## 2018-07-09 NOTE — Care Management Important Message (Signed)
Important Message  Patient Details  Name: Kaitlin [name] MRN: [id number] Date of Birth: 1950/08/06   Medicare Important Message Given:  Yes    Oyuki Hogan [address] 07/09/2018, 3:49 PM

## 2018-07-09 NOTE — Progress Notes (Addendum)
ANTICOAGULATION CONSULT NOTE -   Pharmacy Consult for Heparin bridge (holding Apixaban) Indication: History of DVT/PE  Allergies  Allergen Reactions  . Banana Anaphylaxis  . Aspirin Other (See Comments)    G.I. Upset     Patient Measurements: Height: 5\' 6"  (167.6 cm) Weight: 189 lb 13.1 oz (86.1 kg) IBW/kg (Calculated) : 59.3 Heparin Dosing Weight: 77.7 kg  Vital Signs: Temp: 98.4 F (36.9 C) (02/12 0520) Temp Source: Oral (02/12 0520) BP: 103/67 (02/12 0520) Pulse Rate: 84 (02/12 0520)  Labs: Recent Labs    07/07/18 0343  07/07/18 1151 07/08/18 0353 07/08/18 1051 07/08/18 2051 07/09/18 0413 07/09/18 1028  HGB 11.4*  --   --  10.5*  --   --  10.6*  --   HCT 35.8*  --   --  32.7*  --   --  33.0*  --   PLT 209  --   --  240  --   --  270  --   APTT  --    < > 45* 63* 65* 67*  --  81*  HEPARINUNFRC  --   --  0.80* 0.51  --   --  0.47  --   CREATININE 0.97  --   --   --   --   --  0.89  --    < > = values in this interval not displayed.    Estimated Creatinine Clearance: 67.8 mL/min (by C-G formula based on SCr of 0.89 mg/dL).   Assessment: 66 YOF with recent hx of PE/DVT 03/2018 on Apixaban 5mg  bid prior to admission, last taken pta 07/04/18 at 10:00 AM.  Apixaban held PTA for a procedure and bridged with Lovenox PTA 1.5 mg/kg q24h  taken on 2/8 pta.  She has received Lovenox 1.5 mg/kg SQ q24h on 07/06/18 at ~22:00 last night. Lovenox changed to IV heparin infusion (no bolus) as patient needed ct guided lung bx by IR and port-a-cath placement on 07/11/18.   aPTT level today is 81 seconds, therapeutic on 1600 units/hr.  Heparin level was 0.47 this AM, therapeutic and correlating with aPTT.  Will discontinue the daily aPTT and monitor via heparin levels in the future.  No bleeding or issues noted per RN report.  Hgb 10.6 low/stable and pltc wnl.    Goal of Therapy:  Heparin level 0.3-0.7 units/ml (aPTT  66-102 seconds) Monitor platelets by anticoagulation protocol: Yes   Plan:  Continue  Heparin drip 1600 units/hr (16 ml/hr) Daily heparin level and CBC.  Monitor for any signs/symptoms of bleeding  Thank you for allowing pharmacy to be a part of this patient's care. Nicole Cella, RPh Clinical Pharmacist Clinical phone for 07/09/2018: [phone] **Pharmacist phone directory can now be found on [email or web address] ([email or web address]).  Listed under [place]. 07/09/2018 1:09 PM

## 2018-07-09 NOTE — Evaluation (Signed)
Physical Therapy Evaluation Patient Details Name: Kaitlin [name] MRN: [id number] DOB: 1950-09-25 Today's Date: 07/09/2018   History of Present Illness  Pt is a 68 y/o female admitted secondary to worsening SOB. Found to have PNA and pericardial effusion. PMH includes PE, DM, HTN, and R lung cancer.   Clinical Impression  Pt admitted secondary to problem above with deficits below. Pt tolerated gait training well and required min guard A and use of IV pole. Oxygen sats at 97-98% on 2L throughout mobility and no SOB noted. Anticipate pt will progress well and will not require follow up PT at discharge. Will continue to follow acutely to maximize functional mobility independence and safety.     Follow Up Recommendations No PT follow up;Supervision for mobility/OOB    Equipment Recommendations  None recommended by PT    Recommendations for Other Services       Precautions / Restrictions Precautions Precautions: None Restrictions Weight Bearing Restrictions: No      Mobility  Bed Mobility Overal bed mobility: Modified Independent                Transfers Overall transfer level: Needs assistance Equipment used: None Transfers: Sit to/from Stand Sit to Stand: Min guard         General transfer comment: Min guard for safety.   Ambulation/Gait Ambulation/Gait assistance: Min guard Gait Distance (Feet): 125 Feet Assistive device: IV Pole Gait Pattern/deviations: Step-through pattern;Decreased stride length Gait velocity: Decreased    General Gait Details: Slow, waddle type gait, however, no overt LOB. Pt holding onto IV pole for comfort. No SOB noted and oxygen sats at 97-98% on 2L of oxygen.   Stairs            Wheelchair Mobility    Modified Rankin (Stroke Patients Only)       Balance Overall balance assessment: Needs assistance Sitting-balance support: No upper extremity supported;Feet supported Sitting balance-Leahy Scale: Good     Standing balance  support: No upper extremity supported;During functional activity;Single extremity supported Standing balance-Leahy Scale: Fair Standing balance comment: Able to maintain static standing without UE support                             Pertinent Vitals/Pain Pain Assessment: No/denies pain    Home Living Family/patient expects to be discharged to:: Private residence Living Arrangements: Children Available Help at Discharge: Family;Available 24 hours/day Type of Home: House Home Access: Stairs to enter;Ramped entrance Entrance Stairs-Rails: Right;Left;Can reach both Entrance Stairs-Number of Steps: 6 Home Layout: One level Home Equipment: None      Prior Function Level of Independence: Independent               Hand Dominance        Extremity/Trunk Assessment   Upper Extremity Assessment Upper Extremity Assessment: Overall WFL for tasks assessed    Lower Extremity Assessment Lower Extremity Assessment: Generalized weakness    Cervical / Trunk Assessment Cervical / Trunk Assessment: Normal  Communication   Communication: No difficulties  Cognition Arousal/Alertness: Awake/alert Behavior During Therapy: WFL for tasks assessed/performed Overall Cognitive Status: Within Functional Limits for tasks assessed                                        General Comments General comments (skin integrity, edema, etc.): Pt's sister and granddaughter present during session.  Exercises     Assessment/Plan    PT Assessment Patient needs continued PT services  PT Problem List Cardiopulmonary status limiting activity;Decreased strength;Decreased balance;Decreased mobility       PT Treatment Interventions DME instruction;Gait training;Stair training;Functional mobility training;Therapeutic activities;Therapeutic exercise;Balance training;Patient/family education    PT Goals (Current goals can be found in the Care Plan section)  Acute Rehab PT  Goals Patient Stated Goal: to go home  PT Goal Formulation: With patient Time For Goal Achievement: 07/23/18 Potential to Achieve Goals: Good    Frequency Min 3X/week   Barriers to discharge        Co-evaluation               AM-PAC PT "6 Clicks" Mobility  Outcome Measure Help needed turning from your back to your side while in a flat bed without using bedrails?: None Help needed moving from lying on your back to sitting on the side of a flat bed without using bedrails?: None Help needed moving to and from a bed to a chair (including a wheelchair)?: A Little Help needed standing up from a chair using your arms (e.g., wheelchair or bedside chair)?: A Little Help needed to walk in hospital room?: A Little Help needed climbing 3-5 steps with a railing? : A Lot 6 Click Score: 19    End of Session Equipment Utilized During Treatment: Oxygen Activity Tolerance: Patient tolerated treatment well Patient left: in bed;with call bell/phone within reach;with family/visitor present(sitting EOB ) Nurse Communication: Mobility status;Other (comment)(pt had BM ) PT Visit Diagnosis: Other abnormalities of gait and mobility (R26.89);Muscle weakness (generalized) (M62.81)    Time: 1610-9604 PT Time Calculation (min) (ACUTE ONLY): 28 min   Charges:   PT Evaluation $PT Eval Low Complexity: 1 Low PT Treatments $Gait Training: 8-22 mins        Gladys Damme, PT, DPT  Acute Rehabilitation Services  Pager: [phone] Office: [phone]   Lehman Prom 07/09/2018, 1:39 PM

## 2018-07-09 NOTE — Progress Notes (Signed)
Hypoglycemic Event  CBG: 62  Treatment: snack OJ  Symptoms: none  Follow-up CBG: [id number] CBG Result:85  Possible Reasons for Event: poor po intake  Comments/MD notified:Ensure ordered patient stated that she would drink that.    Jule Economy

## 2018-07-09 NOTE — Progress Notes (Signed)
PROGRESS NOTE        PATIENT DETAILS Name: Kaitlin [name] Age: 68 y.o. Sex: female Date of Birth: 1950-11-20 Admit Date: 07/06/2018 Admitting Physician Lahoma Crocker, MD WUJ:[id number], Channing Mutters, MD  Brief Narrative: Patient is a 68 y.o. female with with recent diagnosis of adenocarcinoma of the lung, venous thromboembolism-presented to the hospital for evaluation of shortness of breath, cough, low-grade fever and pleuritic left-sided chest pain.  Thought to have pneumonia and admitted to the hospitalist service.  See below for further details.  Subjective: Feels better-left-sided chest pain (pleuritic) slowly improving.    Assessment/Plan: Sepsis secondary to left lobar pneumonia: Sepsis pathophysiology continues to improve, leukocytosis resolved. CT chest suggestive of PNA in the vicinity of the known lung mass.  Respiratory virus panel negative, blood cultures continue be negative.  Clinically she is slowly improving-continues to have pleuritic chest pain but that is also getting better slowly.  Continue supportive care-continue cefepime-suspect if clinical improvement continues-we can transition to oral antimicrobial therapy soon.  Pericardial effusion: Seen on CT chest done on 2/10, confirmed on echocardiogram-although effusion has increased compared to most recent echocardiogram, no tamponade physiology seen.  Appreciate cardiology input.  We will continue to monitor closely.  Suspect that this is likely malignant pericardial effusion in the setting of stage IV lung cancer.    History of pulmonary embolism November 2019: Continue IV heparin-patient is scheduled for a lung biopsy and Port-A-Cath placement on 2/14-following which we can transition back to oral anticoagulation.  Stage IV adenocarcinoma of the lung: Recently diagnosed with adenocarcinoma of the lung-did undergo a lung biopsy on 1/22-but needs more tissue for genetic testing.  IR consulted-with plans for  biopsy and Port-A-Cath placement this coming Friday.  Continue IV heparin-once all procedures are complete-we will switch back to oral anticoagulation.  Follows with oncology at [facility] case with Dr. Luiz Ochoa she appears to have stage IV cancer-she is a non-smoker-her prognosis is not all that bad.   Hypertension: Blood pressure controlled-continue chlorthalidone, losartan  DVT Prophylaxis: Full dose anticoagulation with Heparin  Code Status: Full code   Family Communication: None at bedside  Disposition Plan: Remain inpatient-home in the next 1-2 days depending on clinical improvement.  Antimicrobial agents: Anti-infectives (From admission, onward)   Start     Dose/Rate Route Frequency Ordered Stop   07/07/18 1500  vancomycin (VANCOCIN) IVPB 1000 mg/200 mL premix  Status:  Discontinued     1,000 mg 200 mL/hr over 60 Minutes Intravenous Every 24 hours 07/06/18 1346 07/09/18 0751   07/07/18 0200  ceFEPIme (MAXIPIME) 2 g in sodium chloride 0.9 % 100 mL IVPB     2 g 200 mL/hr over 30 Minutes Intravenous Every 12 hours 07/06/18 1346     07/06/18 1300  vancomycin (VANCOCIN) 1,750 mg in sodium chloride 0.9 % 500 mL IVPB     1,750 mg 250 mL/hr over 120 Minutes Intravenous STAT 07/06/18 1250 07/06/18 1826   07/06/18 1245  vancomycin (VANCOCIN) IVPB 1000 mg/200 mL premix  Status:  Discontinued     1,000 mg 200 mL/hr over 60 Minutes Intravenous  Once 07/06/18 1238 07/06/18 1250   07/06/18 1245  ceFEPIme (MAXIPIME) 2 g in sodium chloride 0.9 % 100 mL IVPB     2 g 200 mL/hr over 30 Minutes Intravenous  Once 07/06/18 1238 07/06/18 1457  Procedures: None  CONSULTS:  IR  Time spent: 25 minutes-Greater than 50% of this time was spent in counseling, explanation of diagnosis, planning of further management, and coordination of care.  MEDICATIONS: Scheduled Meds: . chlorthalidone  25 mg Oral Daily  . insulin aspart  0-20 Units Subcutaneous TID WC  .  insulin aspart  0-5 Units Subcutaneous QHS  . insulin aspart  6 Units Subcutaneous TID WC  . losartan  100 mg Oral Daily  . mouth rinse  15 mL Mouth Rinse BID  . pantoprazole  40 mg Oral Daily  . senna-docusate  2 tablet Oral BID  . sodium chloride flush  3 mL Intravenous Q12H   Continuous Infusions: . sodium chloride    . ceFEPime (MAXIPIME) IV 2 g (07/09/18 1402)  . heparin 1,600 Units/hr (07/09/18 0922)   PRN Meds:.sodium chloride, albuterol, HYDROcodone-homatropine, LORazepam, polyethylene glycol, sodium chloride flush, traMADol   PHYSICAL EXAM: Vital signs: Vitals:   07/08/18 0915 07/08/18 1706 07/08/18 2153 07/09/18 0520  BP: 108/68 102/66 112/63 103/67  Pulse:  80 83 84  Resp:  18 18 18   Temp:  98.1 F (36.7 C) 98.8 F (37.1 C) 98.4 F (36.9 C)  TempSrc:  Oral Oral Oral  SpO2:  100% 100% 100%  Weight:      Height:       Filed Weights   07/06/18 1225 07/06/18 1807  Weight: 87.5 kg 86.1 kg   Body mass index is 30.64 kg/m.   General appearance:Awake, alert, not in any distress.  Eyes:no scleral icterus. HEENT: Atraumatic and Normocephalic Neck: supple, no JVD. Resp:Good air entry bilaterally, no added sounds heard anteriorly CVS: S1 S2 regular, no murmurs.  GI: Bowel sounds present, Non tender and not distended with no gaurding, rigidity or rebound. Extremities: B/L Lower Ext shows no edema, both legs are warm to touch Neurology:  Non focal Psychiatric: Normal judgment and insight. Normal mood. Musculoskeletal:No digital cyanosis Skin:No Rash, warm and dry Wounds:N/A  I have personally reviewed following labs and imaging studies  LABORATORY DATA: CBC: Recent Labs  Lab 07/06/18 1239 07/07/18 0343 07/08/18 0353 07/09/18 0413  WBC 18.2* 19.1* 16.5* 9.0  NEUTROABS 14.7*  --   --   --   HGB 12.7 11.4* 10.5* 10.6*  HCT 39.7 35.8* 32.7* 33.0*  MCV 81.0 81.0 80.3 80.5  PLT 243 209 240 270    Basic Metabolic Panel: Recent Labs  Lab 07/06/18 1239  07/07/18 0343 07/09/18 0413  NA 132* 133* 132*  K 3.0* 3.2* 3.0*  CL 94* 100 93*  CO2 22 23 28   GLUCOSE 163* 196* 95  BUN 12 12 14   CREATININE 1.07* 0.97 0.89  CALCIUM 9.6 8.7* 9.1  MG 1.4*  --  1.7    GFR: Estimated Creatinine Clearance: 67.8 mL/min (by C-G formula based on SCr of 0.89 mg/dL).  Liver Function Tests: Recent Labs  Lab 07/06/18 1239  AST 18  ALT 15  ALKPHOS 85  BILITOT 0.9  PROT 7.6  ALBUMIN 3.6   No results for input(s): LIPASE, AMYLASE in the last 168 hours. No results for input(s): AMMONIA in the last 168 hours.  Coagulation Profile: No results for input(s): INR, PROTIME in the last 168 hours.  Cardiac Enzymes: Recent Labs  Lab 07/06/18 1239  TROPONINI <0.03    BNP (last 3 results) No results for input(s): PROBNP in the last 8760 hours.  HbA1C: Recent Labs    07/06/18 1836  HGBA1C 6.5*    CBG: Recent Labs  Lab 07/08/18 1157 07/08/18 1703 07/08/18 2130 07/09/18 0932 07/09/18 1159  GLUCAP 87 139* 72 83 122*    Lipid Profile: No results for input(s): CHOL, HDL, LDLCALC, TRIG, CHOLHDL, LDLDIRECT in the last 72 hours.  Thyroid Function Tests: No results for input(s): TSH, T4TOTAL, FREET4, T3FREE, THYROIDAB in the last 72 hours.  Anemia Panel: No results for input(s): VITAMINB12, FOLATE, FERRITIN, TIBC, IRON, RETICCTPCT in the last 72 hours.  Urine analysis:    Component Value Date/Time   COLORURINE YELLOW 07/06/2018 1555   APPEARANCEUR CLEAR 07/06/2018 1555   LABSPEC 1.012 07/06/2018 1555   PHURINE 6.0 07/06/2018 1555   GLUCOSEU NEGATIVE 07/06/2018 1555   HGBUR MODERATE (A) 07/06/2018 1555   BILIRUBINUR NEGATIVE 07/06/2018 1555   KETONESUR NEGATIVE 07/06/2018 1555   PROTEINUR NEGATIVE 07/06/2018 1555   UROBILINOGEN 0.2 04/11/2007 1936   NITRITE NEGATIVE 07/06/2018 1555   LEUKOCYTESUR MODERATE (A) 07/06/2018 1555    Sepsis Labs: Lactic Acid, Venous    Component Value Date/Time   LATICACIDVEN 1.4 07/06/2018 1836     MICROBIOLOGY: Recent Results (from the past 240 hour(s))  Blood Culture (routine x 2)     Status: None (Preliminary result)   Collection Time: 07/06/18 12:50 PM  Result Value Ref Range Status   Specimen Description BLOOD RIGHT ANTECUBITAL  Final   Special Requests   Final    BOTTLES DRAWN AEROBIC AND ANAEROBIC Blood Culture adequate volume   Culture   Final    NO GROWTH 3 DAYS Performed at [facility], [address], [place], [place] [address]    Report Status PENDING  Incomplete  Blood Culture (routine x 2)     Status: None (Preliminary result)   Collection Time: 07/06/18 12:58 PM  Result Value Ref Range Status   Specimen Description BLOOD RIGHT HAND  Final   Special Requests   Final    BOTTLES DRAWN AEROBIC AND ANAEROBIC Blood Culture adequate volume   Culture   Final    NO GROWTH 3 DAYS Performed at [facility], [address] [place], [place] [address]    Report Status PENDING  Incomplete  Respiratory Panel by PCR     Status: None   Collection Time: 07/07/18 10:20 AM  Result Value Ref Range Status   Adenovirus NOT DETECTED NOT DETECTED Final   Coronavirus 229E NOT DETECTED NOT DETECTED Final    Comment: (NOTE) The Coronavirus on the Respiratory Panel, DOES NOT test for the novel  Coronavirus (2019 nCoV)    Coronavirus HKU1 NOT DETECTED NOT DETECTED Final   Coronavirus NL63 NOT DETECTED NOT DETECTED Final   Coronavirus OC43 NOT DETECTED NOT DETECTED Final   Metapneumovirus NOT DETECTED NOT DETECTED Final   Rhinovirus / Enterovirus NOT DETECTED NOT DETECTED Final   Influenza A NOT DETECTED NOT DETECTED Final   Influenza B NOT DETECTED NOT DETECTED Final   Parainfluenza Virus 1 NOT DETECTED NOT DETECTED Final   Parainfluenza Virus 2 NOT DETECTED NOT DETECTED Final   Parainfluenza Virus 3 NOT DETECTED NOT DETECTED Final   Parainfluenza Virus 4 NOT DETECTED NOT DETECTED Final   Respiratory Syncytial Virus NOT DETECTED NOT DETECTED Final   Bordetella  pertussis NOT DETECTED NOT DETECTED Final   Chlamydophila pneumoniae NOT DETECTED NOT DETECTED Final   Mycoplasma pneumoniae NOT DETECTED NOT DETECTED Final    Comment: Performed at [facility], [address], [place], [place] [address]    RADIOLOGY STUDIES/RESULTS: Dg Chest 2 View  Result Date:  07/06/2018 CLINICAL DATA:  Worsening shortness of breath since last night. Lung cancer. EXAM: CHEST - 2 VIEW COMPARISON:  06/18/2018 PET-CT from 05/14/2018 FINDINGS: Continued right lower lobe airspace opacity. New left lower lobe airspace opacity. Patient has a known posterior mass on the left side. Low lung volumes are present, causing crowding of the pulmonary vasculature. Moderate enlargement of the cardiopericardial silhouette, at least some of which has been shown to be due to pericardial effusion on prior exams. Thoracic spondylosis.  Mild thickening along the minor fissure. IMPRESSION: 1. New airspace opacity in the left lower lobe. Although potentially from pneumonia, if the patient is receiving radiation therapy to the known left lower lobe mass then radiation pneumonitis would be a differential diagnostic consideration. 2. Stable airspace opacity in the right lower lobe. 3. Stable enlargement of the cardiopericardial silhouette, previous exams showed a component of pericardial effusion. Electronically Signed   By: Gaylyn Rong M.D.   On: 07/06/2018 13:50   Ct Chest W Contrast  Result Date: 07/07/2018 CLINICAL DATA:  Chest pain.  Short of breath. EXAM: CT CHEST WITH CONTRAST TECHNIQUE: Multidetector CT imaging of the chest was performed during intravenous contrast administration. CONTRAST:  OMNIPAQUE IOHEXOL 300 MG/ML  SOLN COMPARISON:  Chest radiograph, 07/06/2018.  Chest CT, 04/24/2018. FINDINGS: Cardiovascular: Heart is normal in size and configuration. There is a moderate to large pericardial effusion with Hounsfield units averaging between 14 and 27. Great vessels are normal in  caliber. No aortic dissection or atherosclerosis. Arch branch vessels are widely patent. Mediastinum/Nodes: No neck base or axillary masses. No axillary adenopathy. There is increased attenuation of the fat throughout the mediastinum with pericardial fluid extending into the superior pericardial recesses. Esophagus is distended with evidence of wall thickening. Esophagus is not well-defined. Previously noted mediastinal and hilar adenopathy is less well-defined but grossly unchanged. Trachea is unremarkable. Lungs/Pleura: Right lower lobe airspace consolidation noted on the prior CT has mostly resolved. There are small bilateral pleural effusions, larger on the left. There is dependent opacity in the lower lobes, left upper lobe lingula and right middle lobe, which may all be due to atelectasis. Atelectasis and a component of infection is possible. The posterior left lower lobe mass is not as well-defined now with contiguous lower lobe opacity. No new lung masses. No discrete nodules. Bronchi to the lower lobes, right middle lobe and left upper lobe lingula are narrowed, somewhat greater than on the prior CT. No pneumothorax. Upper Abdomen: Adrenal gland thickening or masses, similar to the prior CT. No acute findings in the visualized upper abdomen. Musculoskeletal: No fracture or acute finding. No osteoblastic or osteolytic lesions. IMPRESSION: 1. Moderate to large pericardial effusion, increased in size from the prior CT. 2. Mildly dilated, prominent esophagus with evidence of diffuse wall thickening. This is nonspecific. 3. Previously seen right lower lobe consolidation has essentially resolved. Left lower lobe mass is most likely unchanged. It is less well-defined due to contiguous left lower lobe opacity, the latter finding which may reflect atelectasis, infection or a combination. Additional lung base opacities noted bilaterally that are most likely atelectasis. There is narrowing of the right middle lobe,  bilateral lower lobe and left upper lobe lingula bronchi as a pass through infrahilar abnormal soft tissue, presumed to be adenopathy. This bronchial narrowing has increased when compared to the prior study. 4. Small bilateral pleural effusions, larger on the left. Right pleural effusion is smaller than on the prior study and left pleural effusion is new. Electronically Signed  By: Amie Portland M.D.   On: 07/07/2018 15:49   Ct Biopsy  Result Date: 06/18/2018 INDICATION: Left lower lobe hypermetabolic lung mass EXAM: CT BIOPSY MEDICATIONS: None. ANESTHESIA/SEDATION: Fentanyl 100 mcg IV; Versed 2 mg IV Moderate Sedation Time:  13 minutes The patient was continuously monitored during the procedure by the interventional radiology nurse under my direct supervision. FLUOROSCOPY TIME:  None COMPLICATIONS: None immediate. PROCEDURE: Informed written consent was obtained from the patient after a thorough discussion of the procedural risks, benefits and alternatives. All questions were addressed. Maximal Sterile Barrier Technique was utilized including caps, mask, sterile gowns, sterile gloves, sterile drape, hand hygiene and skin antiseptic. A timeout was performed prior to the initiation of the procedure. Under CT guidance, a(n) 17 gauge guide needle was advanced into the left lower lobe lung mass. Subsequently, 3 18 gauge core biopsies were obtained. A collagen plug was deployed in the needle tract. The guide needle was removed. Post biopsy images demonstrate no pneumothorax. Patient tolerated the procedure well without complication. Vital sign monitoring by nursing staff during the procedure will continue as patient is in the special procedures unit for post procedure observation. FINDINGS: The images document guide needle placement within the left lower lobe lung mass. Post biopsy images demonstrate no pneumothorax. IMPRESSION: Successful CT-guided core biopsy of a left lower lobe lung mass. Electronically Signed    By: Jolaine Click M.D.   On: 06/18/2018 12:17   Dg Chest Port 1 View  Result Date: 06/18/2018 CLINICAL DATA:  Status post left lung biopsy EXAM: PORTABLE CHEST 1 VIEW COMPARISON:  CT from earlier in the same day. FINDINGS: Cardiac shadow is enlarged but stable. Left lung base is again identified retrocardiac position. No underlying pneumothorax is seen. Persistent density in the right base stable from the prior CT is noted. No bony abnormality is noted. IMPRESSION: No pneumothorax following lung biopsy on the left. Electronically Signed   By: Alcide Clever M.D.   On: 06/18/2018 13:25     LOS: 3 days   Jeoffrey Massed, MD  [facility]  If 7PM-7AM, please contact night-coverage  Please page via [email or web address]  Go to [email or web address] and use [facility]'s universal password to access. If you do not have the password, please contact the hospital operator.  Locate the [facility] provider you are looking for under [facility] and page to a number that you can be directly reached. If you still have difficulty reaching the provider, please page the [facility] (Director on Call) for the Hospitalists listed on amion for assistance.  07/09/2018, 3:27 PM

## 2018-07-10 ENCOUNTER — Inpatient Hospital Stay (HOSPITAL_COMMUNITY): Payer: Medicare HMO

## 2018-07-10 LAB — GLUCOSE, CAPILLARY
GLUCOSE-CAPILLARY: 142 mg/dL — AB (ref 70–99)
GLUCOSE-CAPILLARY: 161 mg/dL — AB (ref 70–99)
Glucose-Capillary: 98 mg/dL (ref 70–99)
Glucose-Capillary: 71 mg/dL (ref 70–99)

## 2018-07-10 LAB — CBC
HCT: 35 % — ABNORMAL LOW (ref 36.0–46.0)
Hemoglobin: 11.1 g/dL — ABNORMAL LOW (ref 12.0–15.0)
MCH: 25.6 pg — ABNORMAL LOW (ref 26.0–34.0)
MCHC: 31.7 g/dL (ref 30.0–36.0)
MCV: 80.6 fL (ref 80.0–100.0)
Platelets: 344 10*3/uL (ref 150–400)
RBC: 4.34 MIL/uL (ref 3.87–5.11)
RDW: 15.1 % (ref 11.5–15.5)
WBC: 6.8 10*3/uL (ref 4.0–10.5)
nRBC: 0 % (ref 0.0–0.2)

## 2018-07-10 LAB — HEPARIN LEVEL (UNFRACTIONATED)
Heparin Unfractionated: <0.1 IU/mL — ABNORMAL LOW (ref 0.30–0.70)
Heparin Unfractionated: 0.25 IU/mL — ABNORMAL LOW (ref 0.30–0.70)

## 2018-07-10 MED ORDER — MIDAZOLAM HCL 2 MG/2ML IJ SOLN
INTRAMUSCULAR | Status: AC
  Filled 2018-07-10: qty 4

## 2018-07-10 MED ORDER — IOHEXOL 300 MG/ML  SOLN
75.0000 mL | Freq: Once | INTRAMUSCULAR | Status: AC | PRN
  Administered 2018-07-10: 75 mL via INTRAVENOUS

## 2018-07-10 MED ORDER — FENTANYL CITRATE (PF) 100 MCG/2ML IJ SOLN
INTRAMUSCULAR | Status: AC
  Filled 2018-07-10: qty 4

## 2018-07-10 NOTE — Progress Notes (Signed)
ANTICOAGULATION CONSULT NOTE -   Pharmacy Consult for Heparin bridge (holding Apixaban) Indication: History of DVT/PE  Allergies  Allergen Reactions  . Banana Anaphylaxis  . Aspirin Other (See Comments)    G.I. Upset     Patient Measurements: Height: 5\' 6"  (167.6 cm) Weight: 189 lb 13.1 oz (86.1 kg) IBW/kg (Calculated) : 59.3 Heparin Dosing Weight: 77.7 kg  Vital Signs: Temp: 98.2 F (36.8 C) (02/13 2125) Temp Source: Oral (02/13 2125) BP: 100/61 (02/13 2125) Pulse Rate: 89 (02/13 2125)  Labs: Recent Labs    07/08/18 0353 07/08/18 1051 07/08/18 2051 07/09/18 0413 07/09/18 1028 07/09/18 1637 07/10/18 0426 07/10/18 2113  HGB 10.5*  --   --  10.6*  --   --  11.1*  --   HCT 32.7*  --   --  33.0*  --   --  35.0*  --   PLT 240  --   --  270  --   --  344  --   APTT 63* 65* 67*  --  81*  --   --   --   LABPROT  --   --   --   --   --  15.5*  --   --   INR  --   --   --   --   --  1.25  --   --   HEPARINUNFRC 0.51  --   --  0.47  --   --  <0.10* 0.25*  CREATININE  --   --   --  0.89  --   --   --   --     Estimated Creatinine Clearance: 67.8 mL/min (by C-G formula based on SCr of 0.89 mg/dL).   Assessment: 73 YOF with recent hx of PE/DVT 03/2018 on Apixaban 5mg  bid prior to admission, last taken pta 07/04/18 at 10:00 AM.  Apixaban held PTA for a procedure and bridged with Lovenox PTA 1.5 mg/kg q24h  taken on 2/8 pta.  She has received Lovenox 1.5 mg/kg SQ q24h on 07/06/18 at ~22:00 last night. Lovenox changed to IV heparin infusion (no bolus) as patient needed ct guided lung bx by IR and port-a-cath placement on 07/11/18.   Biopsy was canceled this afternoon, and heparin 1600 units/hr was restarted at 1500 (held for 2 hrs). Heparin level = 0.25 this evening.  Goal of Therapy:  Heparin level 0.3-0.7 units/ml (aPTT  66-102 seconds) Monitor platelets by anticoagulation protocol: Yes   Plan:  Increase Heparin rate slightly to 1700 units/hr F/u AM heparin level   Thank  you for allowing pharmacy to be a part of this patient's care. Maryanna Shape, PharmD, BCPS, BCPPS Clinical Pharmacist  Pager: [phone]  **Pharmacist phone directory can now be found on [email or web address] ([email or web address]).  Listed under [place]. 07/10/2018 10:03 PM

## 2018-07-10 NOTE — Sedation Documentation (Signed)
After reading CT, Doctor Anselm Pancoast cancelled procedure of lung biopsy and to restart heparin.

## 2018-07-10 NOTE — Progress Notes (Signed)
PROGRESS NOTE        PATIENT DETAILS Name: Kaitlin [name] Age: 68 y.o. Sex: female Date of Birth: Jul 31, 1950 Admit Date: 07/06/2018 Admitting Physician Lahoma Crocker, MD [id number], Channing Mutters, MD  Brief Narrative: Patient is a 68 y.o. female with with recent diagnosis of adenocarcinoma of the lung, venous thromboembolism-presented to the hospital for evaluation of shortness of breath, cough, low-grade fever and pleuritic left-sided chest pain.  Thought to have pneumonia and admitted to the hospitalist service.  See below for further details.  Subjective: Continues to slowly improve-IR contemplating lung biopsy today.  Assessment/Plan: Sepsis secondary to left lobar pneumonia: Slowly improving-sepsis pathophysiology has improved-continues to have left pleuritic chest pain.  Respiratory virus panel negative.  Blood cultures negative.  Continue cefepime-if clinical improvement continues-we will switch to oral antimicrobial therapy soon.    Pericardial effusion: Seen on CT chest done on 2/10, confirmed on echocardiogram-although effusion has increased compared to most recent echocardiogram, no tamponade physiology seen.  Appreciate cardiology input.  We will continue to monitor closely.  Suspect that this is likely malignant pericardial effusion in the setting of stage IV lung cancer.    History of pulmonary embolism November 2019: Continue IV heparin-patient is scheduled for a lung biopsy and possible Port-A-Cath placement later today-once all procedures are complete-we will transition back to oral anticoagulation.    Stage IV adenocarcinoma of the lung: Recently diagnosed with adenocarcinoma of the lung-did undergo a lung biopsy on 1/22-but needs more tissue for genetic testing.  IR consulted-with plans for biopsy and Port-A-Cath placement possibly later today.  Hypertension: Blood pressure controlled-continue chlorthalidone, losartan  DVT Prophylaxis: Full dose  anticoagulation with Heparin  Code Status: Full code   Family Communication: None at bedside  Disposition Plan: Remain inpatient-home soon-once all IR procedures are complete  Antimicrobial agents: Anti-infectives (From admission, onward)   Start     Dose/Rate Route Frequency Ordered Stop   07/07/18 1500  vancomycin (VANCOCIN) IVPB 1000 mg/200 mL premix  Status:  Discontinued     1,000 mg 200 mL/hr over 60 Minutes Intravenous Every 24 hours 07/06/18 1346 07/09/18 0751   07/07/18 0200  ceFEPIme (MAXIPIME) 2 g in sodium chloride 0.9 % 100 mL IVPB     2 g 200 mL/hr over 30 Minutes Intravenous Every 12 hours 07/06/18 1346     07/06/18 1300  vancomycin (VANCOCIN) 1,750 mg in sodium chloride 0.9 % 500 mL IVPB     1,750 mg 250 mL/hr over 120 Minutes Intravenous STAT 07/06/18 1250 07/06/18 1826   07/06/18 1245  vancomycin (VANCOCIN) IVPB 1000 mg/200 mL premix  Status:  Discontinued     1,000 mg 200 mL/hr over 60 Minutes Intravenous  Once 07/06/18 1238 07/06/18 1250   07/06/18 1245  ceFEPIme (MAXIPIME) 2 g in sodium chloride 0.9 % 100 mL IVPB     2 g 200 mL/hr over 30 Minutes Intravenous  Once 07/06/18 1238 07/06/18 1457      Procedures: None  CONSULTS:  IR  Time spent: 25 minutes-Greater than 50% of this time was spent in counseling, explanation of diagnosis, planning of further management, and coordination of care.  MEDICATIONS: Scheduled Meds: . chlorthalidone  25 mg Oral Daily  . feeding supplement (ENSURE ENLIVE)  237 mL Oral BID BM  . insulin aspart  0-20 Units Subcutaneous TID WC  . insulin aspart  0-5  Units Subcutaneous QHS  . insulin aspart  6 Units Subcutaneous TID WC  . losartan  100 mg Oral Daily  . mouth rinse  15 mL Mouth Rinse BID  . pantoprazole  40 mg Oral Daily  . potassium chloride  40 mEq Oral Once  . senna-docusate  2 tablet Oral BID  . sodium chloride flush  3 mL Intravenous Q12H   Continuous Infusions: . sodium chloride    . ceFEPime (MAXIPIME)  IV 2 g (07/10/18 0124)  . heparin 1,600 Units/hr (07/10/18 0759)   PRN Meds:.sodium chloride, albuterol, HYDROcodone-homatropine, LORazepam, polyethylene glycol, sodium chloride flush, traMADol   PHYSICAL EXAM: Vital signs: Vitals:   07/08/18 2153 07/09/18 0520 07/09/18 2108 07/10/18 0524  BP: 112/63 103/67 112/71 124/69  Pulse: 83 84 83 86  Resp: 18 18    Temp: 98.8 F (37.1 C) 98.4 F (36.9 C) 99.2 F (37.3 C) 98.9 F (37.2 C)  TempSrc: Oral Oral Oral Oral  SpO2: 100% 100% 100% 100%  Weight:      Height:       Filed Weights   07/06/18 1225 07/06/18 1807  Weight: 87.5 kg 86.1 kg   Body mass index is 30.64 kg/m.   General appearance:Awake, alert, not in any distress.  Eyes:no scleral icterus. HEENT: Atraumatic and Normocephalic Neck: supple, no JVD. Resp:Good air entry bilaterally,no rales or rhonchi CVS: S1 S2 regular, no murmurs.  GI: Bowel sounds present, Non tender and not distended with no gaurding, rigidity or rebound. Extremities: B/L Lower Ext shows no edema, both legs are warm to touch Neurology:  Non focal Psychiatric: Normal judgment and insight. Normal mood. Musculoskeletal:No digital cyanosis Skin:No Rash, warm and dry Wounds:N/A  I have personally reviewed following labs and imaging studies  LABORATORY DATA: CBC: Recent Labs  Lab 07/06/18 1239 07/07/18 0343 07/08/18 0353 07/09/18 0413 07/10/18 0426  WBC 18.2* 19.1* 16.5* 9.0 6.8  NEUTROABS 14.7*  --   --   --   --   HGB 12.7 11.4* 10.5* 10.6* 11.1*  HCT 39.7 35.8* 32.7* 33.0* 35.0*  MCV 81.0 81.0 80.3 80.5 80.6  PLT 243 209 240 270 344    Basic Metabolic Panel: Recent Labs  Lab 07/06/18 1239 07/07/18 0343 07/09/18 0413  NA 132* 133* 132*  K 3.0* 3.2* 3.0*  CL 94* 100 93*  CO2 22 23 28   GLUCOSE 163* 196* 95  BUN 12 12 14   CREATININE 1.07* 0.97 0.89  CALCIUM 9.6 8.7* 9.1  MG 1.4*  --  1.7    GFR: Estimated Creatinine Clearance: 67.8 mL/min (by C-G formula based on SCr of  0.89 mg/dL).  Liver Function Tests: Recent Labs  Lab 07/06/18 1239  AST 18  ALT 15  ALKPHOS 85  BILITOT 0.9  PROT 7.6  ALBUMIN 3.6   No results for input(s): LIPASE, AMYLASE in the last 168 hours. No results for input(s): AMMONIA in the last 168 hours.  Coagulation Profile: Recent Labs  Lab 07/09/18 1637  INR 1.25    Cardiac Enzymes: Recent Labs  Lab 07/06/18 1239  TROPONINI <0.03    BNP (last 3 results) No results for input(s): PROBNP in the last 8760 hours.  HbA1C: No results for input(s): HGBA1C in the last 72 hours.  CBG: Recent Labs  Lab 07/09/18 1159 07/09/18 1658 07/09/18 2109 07/09/18 2135 07/10/18 0834  GLUCAP 122* 88 62* 85 98    Lipid Profile: No results for input(s): CHOL, HDL, LDLCALC, TRIG, CHOLHDL, LDLDIRECT in the last 72 hours.  Thyroid Function Tests: No results for input(s): TSH, T4TOTAL, FREET4, T3FREE, THYROIDAB in the last 72 hours.  Anemia Panel: No results for input(s): VITAMINB12, FOLATE, FERRITIN, TIBC, IRON, RETICCTPCT in the last 72 hours.  Urine analysis:    Component Value Date/Time   COLORURINE YELLOW 07/06/2018 1555   APPEARANCEUR CLEAR 07/06/2018 1555   LABSPEC 1.012 07/06/2018 1555   PHURINE 6.0 07/06/2018 1555   GLUCOSEU NEGATIVE 07/06/2018 1555   HGBUR MODERATE (A) 07/06/2018 1555   BILIRUBINUR NEGATIVE 07/06/2018 1555   KETONESUR NEGATIVE 07/06/2018 1555   PROTEINUR NEGATIVE 07/06/2018 1555   UROBILINOGEN 0.2 04/11/2007 1936   NITRITE NEGATIVE 07/06/2018 1555   LEUKOCYTESUR MODERATE (A) 07/06/2018 1555    Sepsis Labs: Lactic Acid, Venous    Component Value Date/Time   LATICACIDVEN 1.4 07/06/2018 1836    MICROBIOLOGY: Recent Results (from the past 240 hour(s))  Blood Culture (routine x 2)     Status: None (Preliminary result)   Collection Time: 07/06/18 12:50 PM  Result Value Ref Range Status   Specimen Description BLOOD RIGHT ANTECUBITAL  Final   Special Requests   Final    BOTTLES DRAWN  AEROBIC AND ANAEROBIC Blood Culture adequate volume   Culture   Final    NO GROWTH 4 DAYS Performed at [facility], [address] [place], [place] [address]    Report Status PENDING  Incomplete  Blood Culture (routine x 2)     Status: None (Preliminary result)   Collection Time: 07/06/18 12:58 PM  Result Value Ref Range Status   Specimen Description BLOOD RIGHT HAND  Final   Special Requests   Final    BOTTLES DRAWN AEROBIC AND ANAEROBIC Blood Culture adequate volume   Culture   Final    NO GROWTH 4 DAYS Performed at [facility], [address], [place], [place] [address]    Report Status PENDING  Incomplete  Respiratory Panel by PCR     Status: None   Collection Time: 07/07/18 10:20 AM  Result Value Ref Range Status   Adenovirus NOT DETECTED NOT DETECTED Final   Coronavirus 229E NOT DETECTED NOT DETECTED Final    Comment: (NOTE) The Coronavirus on the Respiratory Panel, DOES NOT test for the novel  Coronavirus (2019 nCoV)    Coronavirus HKU1 NOT DETECTED NOT DETECTED Final   Coronavirus NL63 NOT DETECTED NOT DETECTED Final   Coronavirus OC43 NOT DETECTED NOT DETECTED Final   Metapneumovirus NOT DETECTED NOT DETECTED Final   Rhinovirus / Enterovirus NOT DETECTED NOT DETECTED Final   Influenza A NOT DETECTED NOT DETECTED Final   Influenza B NOT DETECTED NOT DETECTED Final   Parainfluenza Virus 1 NOT DETECTED NOT DETECTED Final   Parainfluenza Virus 2 NOT DETECTED NOT DETECTED Final   Parainfluenza Virus 3 NOT DETECTED NOT DETECTED Final   Parainfluenza Virus 4 NOT DETECTED NOT DETECTED Final   Respiratory Syncytial Virus NOT DETECTED NOT DETECTED Final   Bordetella pertussis NOT DETECTED NOT DETECTED Final   Chlamydophila pneumoniae NOT DETECTED NOT DETECTED Final   Mycoplasma pneumoniae NOT DETECTED NOT DETECTED Final    Comment: Performed at [facility], [address], [place], [place] [address]    RADIOLOGY STUDIES/RESULTS: Dg Chest 2  View  Result Date: 07/06/2018 CLINICAL DATA:  Worsening shortness of breath since last night. Lung cancer. EXAM: CHEST - 2 VIEW COMPARISON:  06/18/2018 PET-CT from 05/14/2018 FINDINGS: Continued right lower lobe airspace opacity. New left lower lobe airspace opacity. Patient has  a known posterior mass on the left side. Low lung volumes are present, causing crowding of the pulmonary vasculature. Moderate enlargement of the cardiopericardial silhouette, at least some of which has been shown to be due to pericardial effusion on prior exams. Thoracic spondylosis.  Mild thickening along the minor fissure. IMPRESSION: 1. New airspace opacity in the left lower lobe. Although potentially from pneumonia, if the patient is receiving radiation therapy to the known left lower lobe mass then radiation pneumonitis would be a differential diagnostic consideration. 2. Stable airspace opacity in the right lower lobe. 3. Stable enlargement of the cardiopericardial silhouette, previous exams showed a component of pericardial effusion. Electronically Signed   By: Gaylyn Rong M.D.   On: 07/06/2018 13:50   Ct Chest W Contrast  Result Date: 07/07/2018 CLINICAL DATA:  Chest pain.  Short of breath. EXAM: CT CHEST WITH CONTRAST TECHNIQUE: Multidetector CT imaging of the chest was performed during intravenous contrast administration. CONTRAST:  OMNIPAQUE IOHEXOL 300 MG/ML  SOLN COMPARISON:  Chest radiograph, 07/06/2018.  Chest CT, 04/24/2018. FINDINGS: Cardiovascular: Heart is normal in size and configuration. There is a moderate to large pericardial effusion with Hounsfield units averaging between 14 and 27. Great vessels are normal in caliber. No aortic dissection or atherosclerosis. Arch branch vessels are widely patent. Mediastinum/Nodes: No neck base or axillary masses. No axillary adenopathy. There is increased attenuation of the fat throughout the mediastinum with pericardial fluid extending into the superior  pericardial recesses. Esophagus is distended with evidence of wall thickening. Esophagus is not well-defined. Previously noted mediastinal and hilar adenopathy is less well-defined but grossly unchanged. Trachea is unremarkable. Lungs/Pleura: Right lower lobe airspace consolidation noted on the prior CT has mostly resolved. There are small bilateral pleural effusions, larger on the left. There is dependent opacity in the lower lobes, left upper lobe lingula and right middle lobe, which may all be due to atelectasis. Atelectasis and a component of infection is possible. The posterior left lower lobe mass is not as well-defined now with contiguous lower lobe opacity. No new lung masses. No discrete nodules. Bronchi to the lower lobes, right middle lobe and left upper lobe lingula are narrowed, somewhat greater than on the prior CT. No pneumothorax. Upper Abdomen: Adrenal gland thickening or masses, similar to the prior CT. No acute findings in the visualized upper abdomen. Musculoskeletal: No fracture or acute finding. No osteoblastic or osteolytic lesions. IMPRESSION: 1. Moderate to large pericardial effusion, increased in size from the prior CT. 2. Mildly dilated, prominent esophagus with evidence of diffuse wall thickening. This is nonspecific. 3. Previously seen right lower lobe consolidation has essentially resolved. Left lower lobe mass is most likely unchanged. It is less well-defined due to contiguous left lower lobe opacity, the latter finding which may reflect atelectasis, infection or a combination. Additional lung base opacities noted bilaterally that are most likely atelectasis. There is narrowing of the right middle lobe, bilateral lower lobe and left upper lobe lingula bronchi as a pass through infrahilar abnormal soft tissue, presumed to be adenopathy. This bronchial narrowing has increased when compared to the prior study. 4. Small bilateral pleural effusions, larger on the left. Right pleural  effusion is smaller than on the prior study and left pleural effusion is new. Electronically Signed   By: Amie Portland M.D.   On: 07/07/2018 15:49   Ct Biopsy  Result Date: 06/18/2018 INDICATION: Left lower lobe hypermetabolic lung mass EXAM: CT BIOPSY MEDICATIONS: None. ANESTHESIA/SEDATION: Fentanyl 100 mcg IV; Versed 2 mg  IV Moderate Sedation Time:  13 minutes The patient was continuously monitored during the procedure by the interventional radiology nurse under my direct supervision. FLUOROSCOPY TIME:  None COMPLICATIONS: None immediate. PROCEDURE: Informed written consent was obtained from the patient after a thorough discussion of the procedural risks, benefits and alternatives. All questions were addressed. Maximal Sterile Barrier Technique was utilized including caps, mask, sterile gowns, sterile gloves, sterile drape, hand hygiene and skin antiseptic. A timeout was performed prior to the initiation of the procedure. Under CT guidance, a(n) 17 gauge guide needle was advanced into the left lower lobe lung mass. Subsequently, 3 18 gauge core biopsies were obtained. A collagen plug was deployed in the needle tract. The guide needle was removed. Post biopsy images demonstrate no pneumothorax. Patient tolerated the procedure well without complication. Vital sign monitoring by nursing staff during the procedure will continue as patient is in the special procedures unit for post procedure observation. FINDINGS: The images document guide needle placement within the left lower lobe lung mass. Post biopsy images demonstrate no pneumothorax. IMPRESSION: Successful CT-guided core biopsy of a left lower lobe lung mass. Electronically Signed   By: Jolaine Click M.D.   On: 06/18/2018 12:17   Dg Chest Port 1 View  Result Date: 06/18/2018 CLINICAL DATA:  Status post left lung biopsy EXAM: PORTABLE CHEST 1 VIEW COMPARISON:  CT from earlier in the same day. FINDINGS: Cardiac shadow is enlarged but stable. Left lung base  is again identified retrocardiac position. No underlying pneumothorax is seen. Persistent density in the right base stable from the prior CT is noted. No bony abnormality is noted. IMPRESSION: No pneumothorax following lung biopsy on the left. Electronically Signed   By: Alcide Clever M.D.   On: 06/18/2018 13:25     LOS: 4 days   Jeoffrey Massed, MD  [facility]  If 7PM-7AM, please contact night-coverage  Please page via [email or web address]  Go to [email or web address] and use [facility]'s universal password to access. If you do not have the password, please contact the hospital operator.  Locate the [facility] provider you are looking for under [facility] and page to a number that you can be directly reached. If you still have difficulty reaching the provider, please page the [facility] (Director on Call) for the Hospitalists listed on amion for assistance.  07/10/2018, 11:47 AM

## 2018-07-10 NOTE — Sedation Documentation (Signed)
Transported patient to CT for CT scan first. Waiting for doctor for results and make decision to proceed with procedure.

## 2018-07-10 NOTE — Progress Notes (Signed)
Referring Physician(s): * No referring provider recorded for this case *  Supervising Physician: Markus Daft  Patient Status:  Kaitlin [name] - In-pt  Chief Complaint: Left lung mass  Subjective: Patient breathing comfortably on 3L [place].  She has remained on 3L for the past 2 days.  Able to converse without shortness of breath or increased WOB.  States she is able to get to the bathroom and back without difficulty.   Allergies: Banana and Aspirin  Medications: Prior to Admission medications   Medication Sig Start Date End Date Taking? Authorizing Provider  albuterol (PROVENTIL HFA;VENTOLIN HFA) 108 (90 BASE) MCG/ACT inhaler Inhale 2 puffs into the lungs every 6 (six) hours as needed for wheezing.   Yes [provider]  albuterol (PROVENTIL) (2.5 MG/3ML) 0.083% nebulizer solution Take 3 mLs (2.5 mg total) by nebulization every 6 (six) hours as needed for wheezing or shortness of breath. 04/29/18  Yes Gherghe, Vella Redhead, MD  amLODipine (NORVASC) 5 MG tablet Take 5 mg by mouth daily.   Yes [provider]  apixaban (ELIQUIS) 5 MG TABS tablet Take 1 tablet (5 mg total) by mouth 2 (two) times daily. 06/19/18 07/19/22 Yes Ghimire, Henreitta Leber, MD  chlorthalidone (HYGROTON) 25 MG tablet Take 25 mg by mouth daily.   Yes [provider]  enoxaparin (LOVENOX) 150 MG/ML injection Inject 0.85 mLs (130 mg total) into the skin daily. Do not take Eliquis on Friday 2/7. Take lovenox shot on Friday 2/08 and Saturday 2/09. 07/04/18  Yes Derek Jack, MD  HYDROcodone-homatropine [name]) 5-1.5 MG/5ML syrup Take 5 mLs by mouth every 4 (four) hours as needed for cough.  06/20/18  Yes [provider]  LORazepam (ATIVAN) 0.5 MG tablet Take 0.5 mg by mouth at bedtime as needed for anxiety.   Yes [provider]  losartan (COZAAR) 100 MG tablet Take 1 tablet (100 mg total) by mouth daily. 02/11/15  Yes Rolland Porter, MD  pantoprazole (PROTONIX) 40 MG tablet Take 1 tablet (40 mg  total) by mouth daily. 06/18/18  Yes Ghimire, Henreitta Leber, MD  predniSONE (DELTASONE) 10 MG tablet Take 10 mg by mouth daily with breakfast.   Yes [provider]  prochlorperazine (COMPAZINE) 10 MG tablet Take 1 tablet (10 mg total) by mouth every 6 (six) hours as needed for nausea or vomiting. 06/26/18  Yes Lockamy, Randi L, NP-C  Blood Glucose Monitoring Suppl (TRUE METRIX METER) DEVI 1 Device by Does not apply route daily. 04/29/18   Caren Griffins, MD     Vital Signs: BP 124/69 (BP Location: Left Arm)   Pulse 86   Temp 98.9 F (37.2 C) (Oral)   Resp 18   Ht 5\' 6"  (1.676 m)   Wt 189 lb 13.1 oz (86.1 kg)   SpO2 100%   BMI 30.64 kg/m   Physical Exam  NAD, on 3L [place] Chest: Clear to auscultation on the right, decreased breath sounds throughout on the left side.  Heart:  RRR, no murmur  Imaging: Dg Chest 2 View  Result Date: 07/06/2018 CLINICAL DATA:  Worsening shortness of breath since last night. Lung cancer. EXAM: CHEST - 2 VIEW COMPARISON:  06/18/2018 PET-CT from 05/14/2018 FINDINGS: Continued right lower lobe airspace opacity. New left lower lobe airspace opacity. Patient has a known posterior mass on the left side. Low lung volumes are present, causing crowding of the pulmonary vasculature. Moderate enlargement of the cardiopericardial silhouette, at least some of which has been shown to be due to pericardial  effusion on prior exams. Thoracic spondylosis.  Mild thickening along the minor fissure. IMPRESSION: 1. New airspace opacity in the left lower lobe. Although potentially from pneumonia, if the patient is receiving radiation therapy to the known left lower lobe mass then radiation pneumonitis would be a differential diagnostic consideration. 2. Stable airspace opacity in the right lower lobe. 3. Stable enlargement of the cardiopericardial silhouette, previous exams showed a component of pericardial effusion. Electronically Signed   By: Van Clines M.D.   On: 07/06/2018  13:50   Ct Chest W Contrast  Result Date: 07/07/2018 CLINICAL DATA:  Chest pain.  Short of breath. EXAM: CT CHEST WITH CONTRAST TECHNIQUE: Multidetector CT imaging of the chest was performed during intravenous contrast administration. CONTRAST:  12mL OMNIPAQUE IOHEXOL 300 MG/ML  SOLN COMPARISON:  Chest radiograph, 07/06/2018.  Chest CT, 04/24/2018. FINDINGS: Cardiovascular: Heart is normal in size and configuration. There is a moderate to large pericardial effusion with Hounsfield units averaging between 14 and 27. Great vessels are normal in caliber. No aortic dissection or atherosclerosis. Arch branch vessels are widely patent. Mediastinum/Nodes: No neck base or axillary masses. No axillary adenopathy. There is increased attenuation of the fat throughout the mediastinum with pericardial fluid extending into the superior pericardial recesses. Esophagus is distended with evidence of wall thickening. Esophagus is not well-defined. Previously noted mediastinal and hilar adenopathy is less well-defined but grossly unchanged. Trachea is unremarkable. Lungs/Pleura: Right lower lobe airspace consolidation noted on the prior CT has mostly resolved. There are small bilateral pleural effusions, larger on the left. There is dependent opacity in the lower lobes, left upper lobe lingula and right middle lobe, which may all be due to atelectasis. Atelectasis and a component of infection is possible. The posterior left lower lobe mass is not as well-defined now with contiguous lower lobe opacity. No new lung masses. No discrete nodules. Bronchi to the lower lobes, right middle lobe and left upper lobe lingula are narrowed, somewhat greater than on the prior CT. No pneumothorax. Upper Abdomen: Adrenal gland thickening or masses, similar to the prior CT. No acute findings in the visualized upper abdomen. Musculoskeletal: No fracture or acute finding. No osteoblastic or osteolytic lesions. IMPRESSION: 1. Moderate to large  pericardial effusion, increased in size from the prior CT. 2. Mildly dilated, prominent esophagus with evidence of diffuse wall thickening. This is nonspecific. 3. Previously seen right lower lobe consolidation has essentially resolved. Left lower lobe mass is most likely unchanged. It is less well-defined due to contiguous left lower lobe opacity, the latter finding which may reflect atelectasis, infection or a combination. Additional lung base opacities noted bilaterally that are most likely atelectasis. There is narrowing of the right middle lobe, bilateral lower lobe and left upper lobe lingula bronchi as a pass through infrahilar abnormal soft tissue, presumed to be adenopathy. This bronchial narrowing has increased when compared to the prior study. 4. Small bilateral pleural effusions, larger on the left. Right pleural effusion is smaller than on the prior study and left pleural effusion is new. Electronically Signed   By: Lajean Manes M.D.   On: 07/07/2018 15:49    Labs:  CBC: Recent Labs    07/07/18 0343 07/08/18 0353 07/09/18 0413 07/10/18 0426  WBC 19.1* 16.5* 9.0 6.8  HGB 11.4* 10.5* 10.6* 11.1*  HCT 35.8* 32.7* 33.0* 35.0*  PLT 209 240 270 344    COAGS: Recent Labs    06/18/18 0438  07/08/18 0353 07/08/18 1051 07/08/18 2051 07/09/18 1028 07/09/18 1637  INR 1.21  --   --   --   --   --  1.25  APTT 80*   < > 63* 65* 67* 81*  --    < > = values in this interval not displayed.    BMP: Recent Labs    06/18/18 0438 07/06/18 1239 07/07/18 0343 07/09/18 0413  NA 135 132* 133* 132*  K 3.0* 3.0* 3.2* 3.0*  CL 99 94* 100 93*  CO2 25 22 23 28   GLUCOSE 114* 163* 196* 95  BUN 7* 12 12 14   CALCIUM 9.6 9.6 8.7* 9.1  CREATININE 0.95 1.07* 0.97 0.89  GFRNONAA >60 54* >60 >60  GFRAA >60 >60 >60 >60    LIVER FUNCTION TESTS: Recent Labs    04/24/18 1347 06/16/18 1527 07/06/18 1239  BILITOT 1.9* 0.5 0.9  AST 54* 19 18  ALT 44 14 15  ALKPHOS 188* 100 85  PROT 7.8  7.9 7.6  ALBUMIN 3.5 3.9 3.6    Assessment and Plan: Left Lung mass Patient admitted for left lobar pneumonia.  Her WBC is resolved.  Her remains afebrile.  She was evaluated by Cardiology for her pericardial effusion, but no indication for percardiocentesis at present per Dr. Einar Gip.   IR has been following for signs of improvement and ability to move forward with lung biopsy.   Port-A-Cath has also been requested.  Ideally these procedures would be able to be done at the same time, however given different modalities this may not be feasible.   Discussed case with Dr. Anselm Pancoast today.  Patient assessed and appears improved from her acute pneumonia.  She is still requiring 3L [place]. She is awaiting lung biopsy to be able to resume anticoagulation.   Will plan for repeat Chest CT today as no new imaging has been obtained since 2/10.   Have made patient NPO and held heparin for possible lung biopsy today if patient stable, able to lie flat for procedure, imaging favorable, and schedule allows.   Electronically Signed: Docia Barrier, PA 07/10/2018, 11:24 AM   I spent a total of 15 Minutes at the the patient's bedside AND on the patient's [name] floor or unit, greater than 50% of which was counseling/coordinating care for adenocarcinoma of the lung.

## 2018-07-10 NOTE — Progress Notes (Signed)
Heparin drip was ordered for the patient at 1600 units/hr.   During bedside shift report heparin drip was found to be stopped. Night shift RN could not tell day shift RN why the heparin drip was stopped. Patient was originally scheduled for a biopsy and port placement 07/11/18.  There was no order to stop the heparin drip, no note from pharmacy or care team to stop the drip.   Patient was updated about the need for the drip to be resumed.  Pharmacy was called by dayshift RN and heparin was resumed. Will continue to monitor.

## 2018-07-10 NOTE — Progress Notes (Signed)
PATIENT ADMITTED TO [facility] AND SEEN BY ASHLYN BRUNING, PA-C AS AN INPATIENT.  Thoracic Location of Tumor / Histology: Stage IV adenocarcinoma of lung with greater than 20 brain mets   Patient presented to [facility] ER in November 2019 with gradually worsening dyspnea, cough and tachypnea.  Biopsies of lung (if applicable) revealed:    Tobacco/Marijuana/Snuff/ETOH use: Never a smoker  Past/Anticipated interventions by cardiothoracic surgery, if any: no  Past/Anticipated interventions by medical oncology, if any:   Signs/Symptoms  Weight changes, if any:   Respiratory complaints, if any: cough, dyspnea  Hemoptysis, if any:   Pain issues, if any:    SAFETY ISSUES:  Prior radiation?   Pacemaker/ICD?    Possible current pregnancy?no  Is the patient on methotrexate?  Current Complaints / other details:  68 year old. Widowed with 4 children. Father with hx of leukemia. Retired. 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

## 2018-07-11 ENCOUNTER — Ambulatory Visit: Payer: Medicare HMO

## 2018-07-11 ENCOUNTER — Inpatient Hospital Stay (HOSPITAL_COMMUNITY): Payer: Medicare HMO

## 2018-07-11 ENCOUNTER — Ambulatory Visit
Admission: RE | Admit: 2018-07-11 | Discharge: 2018-07-11 | Disposition: A | Discharge status: Home / Self Care | Payer: Medicare HMO | Source: Ambulatory Visit | Attending: Radiation Oncology | Admitting: Radiation Oncology

## 2018-07-11 ENCOUNTER — Other Ambulatory Visit (HOSPITAL_COMMUNITY): Payer: Self-pay | Admitting: Nurse Practitioner

## 2018-07-11 ENCOUNTER — Other Ambulatory Visit: Payer: Self-pay

## 2018-07-11 DIAGNOSIS — C7931 Secondary malignant neoplasm of brain: Secondary | ICD-10-CM

## 2018-07-11 DIAGNOSIS — C3492 Malignant neoplasm of unspecified part of left bronchus or lung: Secondary | ICD-10-CM

## 2018-07-11 DIAGNOSIS — I2699 Other pulmonary embolism without acute cor pulmonale: Secondary | ICD-10-CM

## 2018-07-11 LAB — BASIC METABOLIC PANEL
Anion gap: 10 (ref 5–15)
BUN: 9 mg/dL (ref 8–23)
CALCIUM: 9.1 mg/dL (ref 8.9–10.3)
CO2: 28 mmol/L (ref 22–32)
Chloride: 94 mmol/L — ABNORMAL LOW (ref 98–111)
Creatinine, Ser: 0.79 mg/dL (ref 0.44–1.00)
GFR calc Af Amer: >60 mL/min (ref >60)
GFR calc non Af Amer: >60 mL/min (ref >60)
Glucose, Bld: 119 mg/dL — ABNORMAL HIGH (ref 70–99)
Potassium: 2.7 mmol/L — CL (ref 3.5–5.1)
Sodium: 132 mmol/L — ABNORMAL LOW (ref 135–145)

## 2018-07-11 LAB — CULTURE, BLOOD (ROUTINE X 2)
Culture: NO GROWTH
Culture: NO GROWTH
SPECIAL REQUESTS: ADEQUATE
Special Requests: ADEQUATE

## 2018-07-11 LAB — CBC
HCT: 32.8 % — ABNORMAL LOW (ref 36.0–46.0)
HEMOGLOBIN: 10.9 g/dL — AB (ref 12.0–15.0)
MCH: 26.4 pg (ref 26.0–34.0)
MCHC: 33.2 g/dL (ref 30.0–36.0)
MCV: 79.4 fL — ABNORMAL LOW (ref 80.0–100.0)
Platelets: 292 10*3/uL (ref 150–400)
RBC: 4.13 MIL/uL (ref 3.87–5.11)
RDW: 15.3 % (ref 11.5–15.5)
WBC: 8.9 10*3/uL (ref 4.0–10.5)
nRBC: 0 % (ref 0.0–0.2)

## 2018-07-11 LAB — GLUCOSE, CAPILLARY
GLUCOSE-CAPILLARY: 95 mg/dL (ref 70–99)
Glucose-Capillary: 101 mg/dL — ABNORMAL HIGH (ref 70–99)
Glucose-Capillary: 110 mg/dL — ABNORMAL HIGH (ref 70–99)
Glucose-Capillary: 121 mg/dL — ABNORMAL HIGH (ref 70–99)

## 2018-07-11 LAB — HEPARIN LEVEL (UNFRACTIONATED)
Heparin Unfractionated: 0.26 IU/mL — ABNORMAL LOW (ref 0.30–0.70)
Heparin Unfractionated: <0.1 IU/mL — ABNORMAL LOW (ref 0.30–0.70)
Heparin Unfractionated: 0.49 IU/mL (ref 0.30–0.70)

## 2018-07-11 MED ORDER — MIDAZOLAM HCL 2 MG/2ML IJ SOLN
INTRAMUSCULAR | Status: AC
  Filled 2018-07-11: qty 4

## 2018-07-11 MED ORDER — FENTANYL CITRATE (PF) 100 MCG/2ML IJ SOLN
INTRAMUSCULAR | Status: AC | PRN
  Administered 2018-07-11: 25 ug via INTRAVENOUS
  Administered 2018-07-11: 50 ug via INTRAVENOUS
  Administered 2018-07-11: 25 ug via INTRAVENOUS

## 2018-07-11 MED ORDER — MAGNESIUM SULFATE 4 GM/100ML IV SOLN
4.0000 g | Freq: Once | INTRAVENOUS | Status: AC
  Administered 2018-07-11: 4 g via INTRAVENOUS
  Filled 2018-07-11: qty 100

## 2018-07-11 MED ORDER — MIDAZOLAM HCL 2 MG/2ML IJ SOLN
INTRAMUSCULAR | Status: AC | PRN
  Administered 2018-07-11: 0.5 mg via INTRAVENOUS
  Administered 2018-07-11: 1 mg via INTRAVENOUS
  Administered 2018-07-11: 0.5 mg via INTRAVENOUS

## 2018-07-11 MED ORDER — LIDOCAINE-EPINEPHRINE 1 %-1:100000 IJ SOLN
INTRAMUSCULAR | Status: AC
  Filled 2018-07-11: qty 1

## 2018-07-11 MED ORDER — POTASSIUM CHLORIDE 10 MEQ/100ML IV SOLN
10.0000 meq | INTRAVENOUS | Status: AC
  Administered 2018-07-11 (×4): 10 meq via INTRAVENOUS
  Filled 2018-07-11 (×3): qty 100

## 2018-07-11 MED ORDER — AMOXICILLIN-POT CLAVULANATE 875-125 MG PO TABS
1.0000 | ORAL_TABLET | Freq: Two times a day (BID) | ORAL | Status: DC
  Administered 2018-07-11 – 2018-07-12 (×3): 1 via ORAL
  Filled 2018-07-11 (×3): qty 1

## 2018-07-11 MED ORDER — FENTANYL CITRATE (PF) 100 MCG/2ML IJ SOLN
INTRAMUSCULAR | Status: AC
  Filled 2018-07-11: qty 4

## 2018-07-11 NOTE — Progress Notes (Signed)
Physical Therapy Treatment Patient Details Name: Kaitlin [name] MRN: [id number] DOB: Mar 10, 1951 Today's Date: 07/11/2018    History of Present Illness Pt is a 68 y/o female admitted secondary to worsening SOB. Found to have PNA and pericardial effusion. PMH includes PE, DM, HTN, and R lung cancer.     PT Comments    Patient doing well this afternoon, ambulating unit without LOB, DOE, SOB. 3L satting well, will benefit from another check in the AM for home O2. Mild unsteadiness noted and use of IV pole for balance, discussed role of OP therapy if patient continues to require UE support with balance pt agreeable. Ambulated 180' today without issue.     Follow Up Recommendations  No PT follow up;Supervision for mobility/OOB     Equipment Recommendations  None recommended by PT    Recommendations for Other Services       Precautions / Restrictions Precautions Precautions: None Restrictions Weight Bearing Restrictions: No    Mobility  Bed Mobility Overal bed mobility: Modified Independent                Transfers Overall transfer level: Needs assistance Equipment used: None Transfers: Sit to/from Stand Sit to Stand: Min guard            Ambulation/Gait Ambulation/Gait assistance: Min guard   Assistive device: IV Pole Gait Pattern/deviations: Step-through pattern;Decreased stride length Gait velocity: decreased   General Gait Details: patient ambulating unit without LOB, DOE, SOB   Stairs             Wheelchair Mobility    Modified Rankin (Stroke Patients Only)       Balance Overall balance assessment: Needs assistance Sitting-balance support: No upper extremity supported;Feet supported Sitting balance-Leahy Scale: Good       Standing balance-Leahy Scale: Fair                              Cognition Arousal/Alertness: Awake/alert Behavior During Therapy: WFL for tasks assessed/performed Overall Cognitive Status: Within  Functional Limits for tasks assessed                                        Exercises      General Comments        Pertinent Vitals/Pain Pain Assessment: No/denies pain    Home Living                      Prior Function            PT Goals (current goals can now be found in the care plan section) Acute Rehab PT Goals Patient Stated Goal: to go home  PT Goal Formulation: With patient Time For Goal Achievement: 07/23/18 Potential to Achieve Goals: Good Progress towards PT goals: Progressing toward goals    Frequency    Min 3X/week      PT Plan Current plan remains appropriate    Co-evaluation              AM-PAC PT "6 Clicks" Mobility   Outcome Measure  Help needed turning from your back to your side while in a flat bed without using bedrails?: None Help needed moving from lying on your back to sitting on the side of a flat bed without using bedrails?: None Help needed moving to and from a bed to a  chair (including a wheelchair)?: A Little Help needed standing up from a chair using your arms (e.g., wheelchair or bedside chair)?: A Little Help needed to walk in hospital room?: A Little Help needed climbing 3-5 steps with a railing? : A Lot 6 Click Score: 19    End of Session Equipment Utilized During Treatment: Oxygen Activity Tolerance: Patient tolerated treatment well Patient left: in bed;with call bell/phone within reach;with family/visitor present Nurse Communication: Mobility status;Other (comment) PT Visit Diagnosis: Other abnormalities of gait and mobility (R26.89);Muscle weakness (generalized) (M62.81)     Time: 3244-0102 PT Time Calculation (min) (ACUTE ONLY): 15 min  Charges:  $Gait Training: 8-22 mins                     Etta Grandchild, PT, DPT Acute Rehabilitation Services Pager: [phone] Office: [phone]    Etta Grandchild 07/11/2018, 5:19 PM

## 2018-07-11 NOTE — Consult Note (Signed)
Chief Complaint: Patient was seen in consultation today for right neck mass biopsy Chief Complaint  Patient presents with  . Shortness of Breath  . Tachycardia   at the request of Dr Nena Alexander  Supervising Physician: Corrie Mckusick  Patient Status: [facility] - In-pt  History of Present Illness: Kaitlin [name] is a 68 y.o. female   Admitted to [facility] with dyspnea and tachypnea 11/28 Found large Rt PE and LLL mass Started on Eliquis and IV Hep Lung mass biopsy 06/17/18:  Lung, needle/core biopsy(ies), Left lower lobe - ADENOCARCINOMA. Additional samples were needed for further genetic testing and was scheduled for repeat OP bx 07/07/18 But  Pt presented to [facility] ED with worsening SOB Treating now for PNA/sepsis  Was intending to get OP bx done while inpt Pt developed new Left PE CT yesterday:  IMPRESSION: 1. Bilateral pulmonary emboli. Pulmonary emboli in the left lung is new since 04/24/2018 and difficult to exclude acute thrombus in the right lung. 2. Persistent densities and consolidation in the left lower lung. Findings are compatible with combination of the known left lung lesion, compressive atelectasis and possibly pneumonia. Left pleural effusion has slightly enlarged in size. 3. Persistent pleuroparenchymal disease in the right lower lobe that is minimally changed since 07/07/2017 and probably associated with previous large pulmonary embolism in this area. 4. Large pericardial effusion have not significantly changed since the recent comparison examination. 5. Extensive soft tissue throughout the mediastinum and bilateral hilar regions is concerning for metastatic disease based on the previous PET-CT. There also appears to be diffuse thickening of the esophagus. In addition, there is abnormal soft tissue in the supraclavicular regions and cannot exclude metastatic lymphadenopathy. Recommend further characterization of the supraclavicular lymphadenopathy with ultrasound to  see if this would be an adequate site for additional tissue sampling. 6. Persistent thickening of the adrenal glands which is also concerning for metastatic disease.  On Heparin drip Further discussion with IR Rads  FINDINGS: Abnormal heterogeneous soft tissue on both sides of the neck. Tissue on the right side of the neck appears to be edematous but there is concern for underlying abnormal soft tissue or lymphadenopathy lateral to the right internal jugular vein. These findings correspond with the recent CT findings. On the left side, there is more distinct abnormal soft tissue that is concerning for metastatic disease. Irregular hypoechoic lymph node or lesion just lateral to the left internal jugular vein measuring 1.1 cm in the short axis. There is an irregular hypoechoic collection in the lateral left neck which could represent fluid or necrotic lymph node measuring 1.6 x 1.0 x 1.1 cm. There is another cystic hypoechoic structure along the lateral left neck measuring 1.2 x 0.8 x 1.4 cm.  Will perform biopsy of right neck mass today   Past Medical History:  Diagnosis Date  . Asthma   . Carpal tunnel syndrome   . Cataract    left eye  . Diabetes mellitus without complication ([place])   . DVT (deep venous thrombosis) ([place])   . Hypertension   . Pulmonary embolism ([place]) 03/2018    Past Surgical History:  Procedure Laterality Date  . ABDOMINAL HYSTERECTOMY    . biopsy of right breast    . FOOT SURGERY    . implant left eye    . INTRAOCULAR LENS INSERTION      Allergies: Banana and Aspirin  Medications: Prior to Admission medications   Medication Sig Start Date End Date Taking? Authorizing Provider  albuterol (PROVENTIL  HFA;VENTOLIN HFA) 108 (90 BASE) MCG/ACT inhaler Inhale 2 puffs into the lungs every 6 (six) hours as needed for wheezing.   Yes [provider]  albuterol (PROVENTIL) (2.5 MG/3ML) 0.083% nebulizer solution Take 3 mLs (2.5 mg total) by  nebulization every 6 (six) hours as needed for wheezing or shortness of breath. 04/29/18  Yes Gherghe, Vella Redhead, MD  amLODipine (NORVASC) 5 MG tablet Take 5 mg by mouth daily.   Yes [provider]  apixaban (ELIQUIS) 5 MG TABS tablet Take 1 tablet (5 mg total) by mouth 2 (two) times daily. 06/19/18 07/19/22 Yes Ghimire, Henreitta Leber, MD  chlorthalidone (HYGROTON) 25 MG tablet Take 25 mg by mouth daily.   Yes [provider]  enoxaparin (LOVENOX) 150 MG/ML injection Inject 0.85 mLs (130 mg total) into the skin daily. Do not take Eliquis on Friday 2/7. Take lovenox shot on Friday 2/08 and Saturday 2/09. 07/04/18  Yes Derek Jack, MD  HYDROcodone-homatropine [facility]) 5-1.5 MG/5ML syrup Take 5 mLs by mouth every 4 (four) hours as needed for cough.  06/20/18  Yes [provider]  LORazepam (ATIVAN) 0.5 MG tablet Take 0.5 mg by mouth at bedtime as needed for anxiety.   Yes [provider]  losartan (COZAAR) 100 MG tablet Take 1 tablet (100 mg total) by mouth daily. 02/11/15  Yes Rolland Porter, MD  pantoprazole (PROTONIX) 40 MG tablet Take 1 tablet (40 mg total) by mouth daily. 06/18/18  Yes Ghimire, Henreitta Leber, MD  predniSONE (DELTASONE) 10 MG tablet Take 10 mg by mouth daily with breakfast.   Yes [provider]  prochlorperazine (COMPAZINE) 10 MG tablet Take 1 tablet (10 mg total) by mouth every 6 (six) hours as needed for nausea or vomiting. 06/26/18  Yes Lockamy, Randi L, NP-C  Blood Glucose Monitoring Suppl (TRUE METRIX METER) DEVI 1 Device by Does not apply route daily. 04/29/18   Caren Griffins, MD     Family History  Problem Relation Age of Onset  . Heart failure Mother   . Stroke Mother   . Heart attack Mother   . Kidney failure Other   . Leukemia Father     Social History   Socioeconomic History  . Marital status: Widowed    Spouse name: Not on file  . Number of children: 4  . Years of education: 68  . Highest education level: High school  graduate  Occupational History  . Not on file  Social Needs  . Financial resource strain: Very hard  . Food insecurity:    Worry: Sometimes true    Inability: Sometimes true  . Transportation needs:    Medical: No    Non-medical: No  Tobacco Use  . Smoking status: Never Smoker  . Smokeless tobacco: Never Used  Substance and Sexual Activity  . Alcohol use: Not Currently    Comment: occ  . Drug use: No  . Sexual activity: Yes    Partners: Male    Birth control/protection: Surgical  Lifestyle  . Physical activity:    Days per week: 0 days    Minutes per session: Not on file  . Stress: Only a little  Relationships  . Social connections:    Talks on phone: More than three times a week    Gets together: More than three times a week    Attends religious service: More than 4 times per year    Active member of club or organization: Yes    Attends meetings of clubs  or organizations: More than 4 times per year    Relationship status: Widowed  Other Topics Concern  . 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    Review of Systems: A 12 point ROS discussed and pertinent positives are indicated in the HPI above.  All other systems are negative.  Review of Systems  Constitutional: Positive for activity change, appetite change and fatigue. Negative for fever.  Respiratory: Negative for cough and shortness of breath.   Cardiovascular: Negative for chest pain.  Neurological: Positive for weakness.  Hematological: Positive for adenopathy.  Psychiatric/Behavioral: Negative for behavioral problems and confusion.    Vital Signs: BP 110/68 (BP Location: Left Arm)   Pulse 88   Temp 98.2 F (36.8 C) (Oral)   Resp 16   Ht 5\' 6"  (1.676 m)   Wt 189 lb 13.1 oz (86.1 kg)   SpO2 100%    BMI 30.64 kg/m   Physical Exam Vitals signs reviewed.  Cardiovascular:     Rate and Rhythm: Normal rate and regular rhythm.  Pulmonary:     Effort: Pulmonary effort is normal.     Breath sounds: Normal breath sounds.  Abdominal:     General: Bowel sounds are normal.     Palpations: Abdomen is soft.  Musculoskeletal: Normal range of motion.  Skin:    General: Skin is warm and dry.  Neurological:     Mental Status: She is alert and oriented to person, place, and time.  Psychiatric:        Mood and Affect: Mood normal.        Behavior: Behavior normal.     Imaging: Dg Chest 2 View  Result Date: 07/06/2018 CLINICAL DATA:  Worsening shortness of breath since last night. Lung cancer. EXAM: CHEST - 2 VIEW COMPARISON:  06/18/2018 PET-CT from 05/14/2018 FINDINGS: Continued right lower lobe airspace opacity. New left lower lobe airspace opacity. Patient has a known posterior mass on the left side. Low lung volumes are present, causing crowding of the pulmonary vasculature. Moderate enlargement of the cardiopericardial silhouette, at least some of which has been shown to be due to pericardial effusion on prior exams. Thoracic spondylosis.  Mild thickening along the minor fissure. IMPRESSION: 1. New airspace opacity in the left lower lobe. Although potentially from pneumonia, if the patient is receiving radiation therapy to the known left lower lobe mass then radiation pneumonitis would be a differential diagnostic consideration. 2. Stable airspace opacity in the right lower lobe. 3. Stable enlargement of the cardiopericardial silhouette, previous exams showed a component of pericardial effusion. Electronically Signed   By: Van Clines M.D.   On: 07/06/2018 13:50   Ct Chest W Contrast  Result Date: 07/10/2018 CLINICAL DATA:  68 year old with biopsy-proven adenocarcinoma in the left lower lobe. Patient has been recently admitted for sepsis and pneumonia. Patient also history of pulmonary  embolism and on anticoagulation. Plan for follow-up chest CT to see if the patient would be a candidate for repeat left lung biopsy because additional tissue is needed for molecular testing. EXAM: CT CHEST WITH CONTRAST TECHNIQUE: Multidetector CT imaging of the chest was performed during intravenous contrast administration. CONTRAST:  80mL OMNIPAQUE IOHEXOL 300 MG/ML  SOLN COMPARISON:  CT 07/07/2018 FINDINGS: Cardiovascular: Again noted is  a large pericardial effusion which is similar in size to the study on 07/07/2018. Patient has bilateral pulmonary emboli. There is emboli in the distal right pulmonary artery extending into the right upper lobe and right lower lobe branches. There is also emboli in the left lower lobe and lingular branches. The left pulmonary emboli are new since 04/24/2018. Normal caliber of the thoracic aorta. The great vessels are patent. Mediastinum/Nodes: There appears to be marked thickening or enlargement of the esophagus and this is similar to the prior CTs. Again noted is diffuse soft tissue or edema in the mediastinum. Concern for bilateral hilar soft tissue enlargement or lymphadenopathy. There is also increased edema and possible lymphadenopathy in the supraclavicular regions, right side greater than left. Lungs/Pleura: Left pleural effusion has slightly enlarged from the recent comparison examination on 07/07/2018. The lesion in the posterior left lower lobe is poorly characterized due to the adjacent consolidation in left lower lobe. Aeration in the left lower lobe has minimally changed from the recent comparison examination. Persistent sub solid densities along the right lung apex have minimally changed. Again noted is indeterminate nodularity along the right major fissure on sequence 5, image 53 measuring 1.1 x 0.6 cm. Patchy peripheral densities in the right lower lobe have minimally changed from the recent comparison examination. Again noted is a small irregular right pleural  effusion. Upper Abdomen: Again noted is thickening and mild stranding around the bilateral adrenal glands. Images of the upper liver are unremarkable. Musculoskeletal: No acute bone abnormality. IMPRESSION: 1. Bilateral pulmonary emboli. Pulmonary emboli in the left lung is new since 04/24/2018 and difficult to exclude acute thrombus in the right lung. 2. Persistent densities and consolidation in the left lower lung. Findings are compatible with combination of the known left lung lesion, compressive atelectasis and possibly pneumonia. Left pleural effusion has slightly enlarged in size. 3. Persistent pleuroparenchymal disease in the right lower lobe that is minimally changed since 07/07/2017 and probably associated with previous large pulmonary embolism in this area. 4. Large pericardial effusion have not significantly changed since the recent comparison examination. 5. Extensive soft tissue throughout the mediastinum and bilateral hilar regions is concerning for metastatic disease based on the previous PET-CT. There also appears to be diffuse thickening of the esophagus. In addition, there is abnormal soft tissue in the supraclavicular regions and cannot exclude metastatic lymphadenopathy. Recommend further characterization of the supraclavicular lymphadenopathy with ultrasound to see if this would be an adequate site for additional tissue sampling. 6. Persistent thickening of the adrenal glands which is also concerning for metastatic disease. These results were called by telephone at the time of interpretation on 07/10/2018 at 4:45 pm to Dr. Sloan Leiter, who verbally acknowledged these results. Electronically Signed   By: Markus Daft M.D.   On: 07/10/2018 16:50   Ct Chest W Contrast  Result Date: 07/07/2018 CLINICAL DATA:  Chest pain.  Short of breath. EXAM: CT CHEST WITH CONTRAST TECHNIQUE: Multidetector CT imaging of the chest was performed during intravenous contrast administration. CONTRAST:  191mL OMNIPAQUE  IOHEXOL 300 MG/ML  SOLN COMPARISON:  Chest radiograph, 07/06/2018.  Chest CT, 04/24/2018. FINDINGS: Cardiovascular: Heart is normal in size and configuration. There is a moderate to large pericardial effusion with Hounsfield units averaging between 14 and 27. Great vessels are normal in caliber. No aortic dissection or atherosclerosis. Arch branch vessels are widely patent. Mediastinum/Nodes: No neck base or axillary masses. No axillary adenopathy. There is increased attenuation of the fat throughout the mediastinum with pericardial fluid extending  into the superior pericardial recesses. Esophagus is distended with evidence of wall thickening. Esophagus is not well-defined. Previously noted mediastinal and hilar adenopathy is less well-defined but grossly unchanged. Trachea is unremarkable. Lungs/Pleura: Right lower lobe airspace consolidation noted on the prior CT has mostly resolved. There are small bilateral pleural effusions, larger on the left. There is dependent opacity in the lower lobes, left upper lobe lingula and right middle lobe, which may all be due to atelectasis. Atelectasis and a component of infection is possible. The posterior left lower lobe mass is not as well-defined now with contiguous lower lobe opacity. No new lung masses. No discrete nodules. Bronchi to the lower lobes, right middle lobe and left upper lobe lingula are narrowed, somewhat greater than on the prior CT. No pneumothorax. Upper Abdomen: Adrenal gland thickening or masses, similar to the prior CT. No acute findings in the visualized upper abdomen. Musculoskeletal: No fracture or acute finding. No osteoblastic or osteolytic lesions. IMPRESSION: 1. Moderate to large pericardial effusion, increased in size from the prior CT. 2. Mildly dilated, prominent esophagus with evidence of diffuse wall thickening. This is nonspecific. 3. Previously seen right lower lobe consolidation has essentially resolved. Left lower lobe mass is most  likely unchanged. It is less well-defined due to contiguous left lower lobe opacity, the latter finding which may reflect atelectasis, infection or a combination. Additional lung base opacities noted bilaterally that are most likely atelectasis. There is narrowing of the right middle lobe, bilateral lower lobe and left upper lobe lingula bronchi as a pass through infrahilar abnormal soft tissue, presumed to be adenopathy. This bronchial narrowing has increased when compared to the prior study. 4. Small bilateral pleural effusions, larger on the left. Right pleural effusion is smaller than on the prior study and left pleural effusion is new. Electronically Signed   By: Lajean Manes M.D.   On: 07/07/2018 15:49   US Soft Tissue Head & Neck (non-thyroid)  Result Date: 07/11/2018 CLINICAL DATA:  68 year old female with adenocarcinoma in the left lower lobe. Recent chest CT demonstrates abnormal soft tissue in the supraclavicular regions. Evaluate for supraclavicular lymphadenopathy and site for additional tissue sampling. EXAM: ULTRASOUND OF HEAD/NECK SOFT TISSUES TECHNIQUE: Ultrasound examination of the head and neck soft tissues was performed in the area of clinical concern. COMPARISON:  Chest CT 07/10/2018 FINDINGS: Abnormal heterogeneous soft tissue on both sides of the neck. Tissue on the right side of the neck appears to be edematous but there is concern for underlying abnormal soft tissue or lymphadenopathy lateral to the right internal jugular vein. These findings correspond with the recent CT findings. On the left side, there is more distinct abnormal soft tissue that is concerning for metastatic disease. Irregular hypoechoic lymph node or lesion just lateral to the left internal jugular vein measuring 1.1 cm in the short axis. There is an irregular hypoechoic collection in the lateral left neck which could represent fluid or necrotic lymph node measuring 1.6 x 1.0 x 1.1 cm. There is another cystic  hypoechoic structure along the lateral left neck measuring 1.2 x 0.8 x 1.4 cm. IMPRESSION: Irregular heterogeneous soft tissue on both sides of the lower neck. Findings are most compatible with metastatic lymphadenopathy based on the recent chest CT. There are small cystic or necrotic lymph nodes on the left side of the neck as described. Electronically Signed   By: Markus Daft M.D.   On: 07/11/2018 07:50   Ct Biopsy  Result Date: 06/18/2018 INDICATION: Left lower lobe hypermetabolic  lung mass EXAM: CT BIOPSY MEDICATIONS: None. ANESTHESIA/SEDATION: Fentanyl 100 mcg IV; Versed 2 mg IV Moderate Sedation Time:  13 minutes The patient was continuously monitored during the procedure by the interventional radiology nurse under my direct supervision. FLUOROSCOPY TIME:  None COMPLICATIONS: None immediate. PROCEDURE: Informed written consent was obtained from the patient after a thorough discussion of the procedural risks, benefits and alternatives. All questions were addressed. Maximal Sterile Barrier Technique was utilized including caps, mask, sterile gowns, sterile gloves, sterile drape, hand hygiene and skin antiseptic. A timeout was performed prior to the initiation of the procedure. Under CT guidance, a(n) 17 gauge guide needle was advanced into the left lower lobe lung mass. Subsequently, 3 18 gauge core biopsies were obtained. A collagen plug was deployed in the needle tract. The guide needle was removed. Post biopsy images demonstrate no pneumothorax. Patient tolerated the procedure well without complication. Vital sign monitoring by nursing staff during the procedure will continue as patient is in the special procedures unit for post procedure observation. FINDINGS: The images document guide needle placement within the left lower lobe lung mass. Post biopsy images demonstrate no pneumothorax. IMPRESSION: Successful CT-guided core biopsy of a left lower lobe lung mass. Electronically Signed   By: Marybelle Killings  M.D.   On: 06/18/2018 12:17   Dg Chest Port 1 View  Result Date: 06/18/2018 CLINICAL DATA:  Status post left lung biopsy EXAM: PORTABLE CHEST 1 VIEW COMPARISON:  CT from earlier in the same day. FINDINGS: Cardiac shadow is enlarged but stable. Left lung base is again identified retrocardiac position. No underlying pneumothorax is seen. Persistent density in the right base stable from the prior CT is noted. No bony abnormality is noted. IMPRESSION: No pneumothorax following lung biopsy on the left. Electronically Signed   By: Inez Catalina M.D.   On: 06/18/2018 13:25   Vas [place] Lower Extremity Venous (dvt)  Result Date: 07/11/2018  Lower Venous Study Indications: Pulmonary embolism.  Performing Technologist: Antonieta Pert RDMS, RVT  Examination Guidelines: A complete evaluation includes B-mode imaging, spectral Doppler, color Doppler, and power Doppler as needed of all accessible portions of each vessel. Bilateral testing is considered an integral part of a complete examination. Limited examinations for reoccurring indications may be performed as noted.  Right Venous Findings: +---------+---------------+---------+-----------+----------+-------+          CompressibilityPhasicitySpontaneityPropertiesSummary +---------+---------------+---------+-----------+----------+-------+ CFV      Full           Yes      Yes                          +---------+---------------+---------+-----------+----------+-------+ SFJ      Full                                                 +---------+---------------+---------+-----------+----------+-------+ FV Prox  Full                                                 +---------+---------------+---------+-----------+----------+-------+ FV Mid   Full                                                 +---------+---------------+---------+-----------+----------+-------+  FV DistalFull                                                  +---------+---------------+---------+-----------+----------+-------+ PFV      Full                                                 +---------+---------------+---------+-----------+----------+-------+ POP      Full           Yes      Yes                          +---------+---------------+---------+-----------+----------+-------+ PTV      Full                                                 +---------+---------------+---------+-----------+----------+-------+ PERO     Full                                                 +---------+---------------+---------+-----------+----------+-------+ GSV      Full                                                 +---------+---------------+---------+-----------+----------+-------+  Left Venous Findings: +---------+---------------+---------+-----------+----------+------------------+          CompressibilityPhasicitySpontaneityPropertiesSummary            +---------+---------------+---------+-----------+----------+------------------+ CFV      Full           Yes      Yes                                     +---------+---------------+---------+-----------+----------+------------------+ SFJ      Full                                                            +---------+---------------+---------+-----------+----------+------------------+ FV Prox  Full                                                            +---------+---------------+---------+-----------+----------+------------------+ FV Mid   Full                                                            +---------+---------------+---------+-----------+----------+------------------+  FV DistalFull                                                            +---------+---------------+---------+-----------+----------+------------------+ PFV      Full                                                             +---------+---------------+---------+-----------+----------+------------------+ POP      Full           Yes      Yes                  slow flow, no                                                            thrombus           +---------+---------------+---------+-----------+----------+------------------+ PTV      Full                                                            +---------+---------------+---------+-----------+----------+------------------+ PERO     Full                                                            +---------+---------------+---------+-----------+----------+------------------+ GSV      Full                                                            +---------+---------------+---------+-----------+----------+------------------+    Summary: Right: There is no evidence of deep vein thrombosis in the lower extremity. No cystic structure found in the popliteal fossa. Left: There is no evidence of deep vein thrombosis in the lower extremity. No cystic structure found in the popliteal fossa.  *See table(s) above for measurements and observations.    Preliminary     Labs:  CBC: Recent Labs    07/08/18 0353 07/09/18 0413 07/10/18 0426 07/11/18 0347  WBC 16.5* 9.0 6.8 8.9  HGB 10.5* 10.6* 11.1* 10.9*  HCT 32.7* 33.0* 35.0* 32.8*  PLT 240 270 344 292    COAGS: Recent Labs    06/18/18 0438  07/08/18 0353 07/08/18 1051 07/08/18 2051 07/09/18 1028 07/09/18 1637  INR 1.21  --   --   --   --   --  1.25  APTT 80*   < > 63* 65* 67* 81*  --    < > =  values in this interval not displayed.    BMP: Recent Labs    07/06/18 1239 07/07/18 0343 07/09/18 0413 07/11/18 0347  NA 132* 133* 132* 132*  K 3.0* 3.2* 3.0* 2.7*  CL 94* 100 93* 94*  CO2 22 23 28 28   GLUCOSE 163* 196* 95 119*  BUN 12 12 14 9   CALCIUM 9.6 8.7* 9.1 9.1  CREATININE 1.07* 0.97 0.89 0.79  GFRNONAA 54* >60 >60 >60  GFRAA >60 >60 >60 >60    LIVER FUNCTION  TESTS: Recent Labs    04/24/18 1347 06/16/18 1527 07/06/18 1239  BILITOT 1.9* 0.5 0.9  AST 54* 19 18  ALT 44 14 15  ALKPHOS 188* 100 85  PROT 7.8 7.9 7.6  ALBUMIN 3.5 3.9 3.6    TUMOR MARKERS: No results for input(s): AFPTM, CEA, CA199, CHROMGRNA in the last 8760 hours.  Assessment and Plan:  Right neck mass For biopsy today' Risks and benefits of right neck mass was discussed with the patient and/or patient's family including, but not limited to bleeding, infection, damage to adjacent structures or low yield requiring additional tests.  All of the questions were answered and there is agreement to proceed. Consent signed and in chart.   Thank you for this interesting consult.  I greatly enjoyed meeting Kaitlin [name] and look forward to participating in their care.  A copy of this report was sent to the requesting provider on this date.  Electronically Signed: Lavonia Drafts, PA-C 07/11/2018, 10:06 AM   I spent a total of 40 Minutes    in face to face in clinical consultation, greater than 50% of which was counseling/coordinating care for right neck mass bx

## 2018-07-11 NOTE — Progress Notes (Signed)
CRITICAL VALUE ALERT  Critical Value:  Potassium 2.7  Date & Time Notied:  07/11/18 0518  Provider Notified: Silas Sacramento, NP 07/11/18 0529  Orders Received/Actions taken: Awaiting response.

## 2018-07-11 NOTE — Progress Notes (Addendum)
NCM learned through [facility] [phone]) pt 's copay cost for Lovenox would be paid by [employer] [facility] program which the pt is a participate of, pt and MD made aware. Whitman Hero RN,BSN,CM

## 2018-07-11 NOTE — Progress Notes (Signed)
ANTICOAGULATION CONSULT NOTE   Pharmacy Consult for Heparin bridge (holding Apixaban) Indication: History of DVT/PE  Patient Measurements: Height: 5\' 6"  (167.6 cm) Weight: 189 lb 13.1 oz (86.1 kg) IBW/kg (Calculated) : 59.3 Heparin Dosing Weight: 77.7 kg  Vital Signs: Temp: 98.3 F (36.8 C) (02/14 2125) Temp Source: Oral (02/14 2125) BP: 111/67 (02/14 2125) Pulse Rate: 85 (02/14 2125)  Labs: Recent Labs    07/09/18 0413 07/09/18 1028 07/09/18 1637 07/10/18 0426  07/11/18 0347 07/11/18 1416 07/11/18 2058  HGB 10.6*  --   --  11.1*  --  10.9*  --   --   HCT 33.0*  --   --  35.0*  --  32.8*  --   --   PLT 270  --   --  344  --  292  --   --   APTT  --  81*  --   --   --   --   --   --   LABPROT  --   --  15.5*  --   --   --   --   --   INR  --   --  1.25  --   --   --   --   --   HEPARINUNFRC 0.47  --   --  <0.10*   < > 0.26* <0.10* 0.49  CREATININE 0.89  --   --   --   --  0.79  --   --    < > = values in this interval not displayed.    Estimated Creatinine Clearance: 75.4 mL/min (by C-G formula based on SCr of 0.79 mg/dL).   Assessment: 47 YOF with recent hx of PE/DVT 03/2018 on Apixaban 5mg  bid prior to admission, last taken pta 07/04/18 at 10:00 AM.  Apixaban held PTA for a procedure and bridged with Lovenox PTA 1.5 mg/kg q24h  taken on 2/8 pta.  She has received Lovenox 1.5 mg/kg SQ q24h on 07/06/18 at ~22:00 last night. Lovenox changed to IV heparin infusion (no bolus) as patient needed ct guided lung bx by IR and port-a-cath placement on 07/11/18.   Heparin was held this morning for bx and resumed around 1430. Plan is transition to lovenox tomorrow. HL came back undetectable for that reason. Re-check level this PM.    2/14 PM update: heparin level therapeutic x 1 tonight  Goal of Therapy:  Heparin level 0.3-0.7 units/ml  Monitor platelets by anticoagulation protocol: Yes   Plan:  Cont heparin gtt at 1850 units/hr Confirmatory heparin level with AM labs  Narda Bonds, PharmD, [facility] Pharmacist Phone: [phone]

## 2018-07-11 NOTE — Consult Note (Signed)
Radiation Oncology         [phone] ________________________________  Initial inpatient Consultation  Name: Kaitlin [name] MRN: [id number]  Date of Service: 07/06/2018 DOB: December 15, 1950  [id number], Kaitlin Mutters, MD,  Kaitlin [name]  REFERRING PHYSICIAN: Dr. Ellin [name]  DIAGNOSIS: 68 y.o. female with newly diagnosed metastatic NSCLC of the left lower lobe lung with significant mediastinal/hilar lymph node involvement and at least 20 subcentimeter brain metastases.    ICD-10-CM   1. HCAP (healthcare-associated pneumonia) J18.9 CANCELED: DG Chest 2 View    CANCELED: DG Chest 2 View  2. Lung cancer (HCC) C34.90 CT BIOPSY    CT BIOPSY    [place] CORE BIOPSY (LYMPH NODES)    [place] CORE BIOPSY (LYMPH NODES)    CANCELED: IR Radiologist Eval & Mgmt    CANCELED: IR Radiologist Eval & Mgmt  3. Adenopathy R59.1 US SOFT TISSUE HEAD & NECK (NON-THYROID)    US SOFT TISSUE HEAD & NECK (NON-THYROID)    HISTORY OF PRESENT ILLNESS: Kaitlin [name] is a 68 y.o. female seen at the request of Kaitlin [name].  She initially presented to the [facility] ED with productive cough, shortness of breath, and right-sided chest pain on 04/24/2018 ongoing for 2 days prior. A CTA chest on admission revealed a large right pulmonary embolus and pleural effusion, as well as a left lower lobe mass measuring 3.8 x 3.7 x 2.1 cm, associated with hilar and mediastinal adenopathy highly suspicious for a pulmonary neoplasm. She met with Kaitlin Dura, NP in pulmonology on 05/05/2018. The initial plan was to place the patient on oral anticoagulation for PE for at least 3 weeks and then discontinue for 48 hours prior to obtaining CT guided biopsy, however, the patient was not comfortable coming off anticoagulation 48 hours prior to procedure d/t associated risks, so the biopsy was postponed, with plans to have the pre-admitted for heparin bridging prior to biopsy.   PET/CT scan was performed on 05/14/2018, which confirmed a hypermetabolic left lower  lobe lung mass measuring 3.7 cm, SUV of 11; hypermetabolic bilateral hilar adenopathy and subcarinal adenopathy;thickened and irregular appearance of bilateral adrenal glands with increased SUV; right lower lobe airspace disease with hypermetabolic rim and a hypermetabolic nodule medial to the main airspace disease, which may represent evolving infection/inflammation.   She met with Kaitlin [name] on 05/19/2018 and he recommended a brain MRI to complete her disease staging.  This was performed on 05/30/2018, and revealed at least twenty subcentimeter enhancing brain lesions consistent with metastases.   She was admitted to the hospital on 06/16/2018 for heparin bridging and underwent lower left lung lobe biopsy on 06/18/2018 with pathology revealing adenocarcinoma.  Immunohistochemistry is positive with TTF-1, Napsin A and cytokeratin 7 consistent with primary lung adenocarcinoma.  Unfortunately, there was not sufficient tissue for molecular testing so Kaitlin [name] has requested additional tissue sampling/biopsy for molecular testing.   She was scheduled for an outpatient consult visit in our office today but unfortunately, she experienced worsening shortness of breath, cough and feeling poorly which prompted re-admission to [facility] on 07/06/2018. She is currently being treated for postobstructive pneumonia and mild sepsis. A repeat chest CT on 07/10/18 revealed progression of pulmonary emboli bilaterally in the lungs as well as abnormal soft tissue in the supraclavicular regions, suspicious for progression of metastatic disease. A follow up head and neck ultrasound confirmed findings most compatible with metastatic lymphadenopathy. She is scheduled for ultrasound-guided biopsy of neck mass/supraclavicular lymphadenopathy for molecular testing later today to help  guide systemic treatment options.  She has a scheduled follow-up visit with Kaitlin [name] in medical oncology at [facility] on  07/15/2018.  We have been asked to consult to discuss the potential for palliative radiation to the brain and chest to aid in the management of her disease.  PREVIOUS RADIATION THERAPY: No  PAST MEDICAL HISTORY:  Past Medical History:  Diagnosis Date  . Asthma   . Carpal tunnel syndrome   . Cataract    left eye  . Diabetes mellitus without complication (HCC)   . DVT (deep venous thrombosis) (HCC)   . Hypertension   . Pulmonary embolism (HCC) 03/2018      PAST SURGICAL HISTORY: Past Surgical History:  Procedure Laterality Date  . ABDOMINAL HYSTERECTOMY    . biopsy of right breast    . FOOT SURGERY    . implant left eye    . INTRAOCULAR LENS INSERTION      FAMILY HISTORY:  Family History  Problem Relation Age of Onset  . Heart failure Mother   . Stroke Mother   . Heart attack Mother   . Kidney failure Other   . Leukemia Father     SOCIAL HISTORY:  Social History   Socioeconomic History  . Marital status: Widowed    Spouse name: Not on file  . Number of children: 4  . Years of education: 77  . Highest education level: High school graduate  Occupational History  . Not on file  Social Needs  . Financial resource strain: Very hard  . Food insecurity:    Worry: Sometimes true    Inability: Sometimes true  . Transportation needs:    Medical: No    Non-medical: No  Tobacco Use  . Smoking status: Never Smoker  . Smokeless tobacco: Never Used  Substance and Sexual Activity  . Alcohol use: Not Currently    Comment: occ  . Drug use: No  . Sexual activity: Yes    Partners: Male    Birth control/protection: Surgical  Lifestyle  . Physical activity:    Days per week: 0 days    Minutes per session: Not on file  . Stress: Only a little  Relationships  . Social connections:    Talks on phone: More than three times a week    Gets together: More than three times a week    Attends religious service: More than 4 times per year    Active member of club or  organization: Yes    Attends meetings of clubs or organizations: More than 4 times per year    Relationship status: Widowed  . Intimate partner violence:    Fear of current or ex partner: No    Emotionally abused: No    Physically abused: No    Forced sexual activity: No  Other Topics Concern  . 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    ALLERGIES: Banana and Aspirin  MEDICATIONS:  Current Facility-Administered Medications  Medication Dose Route Frequency Provider Last Rate Last Dose  . 0.9 %  sodium chloride infusion  250 mL Intravenous PRN Lahoma Crocker, MD 50 mL/hr at 07/11/18 [phone]    . albuterol (PROVENTIL) (2.5 MG/3ML) 0.083% nebulizer solution 2.5 mg  2.5 mg  Nebulization Q6H PRN Lahoma Crocker, MD      . ceFEPIme (MAXIPIME) 2 g in sodium chloride 0.9 % 100 mL IVPB  2 g Intravenous Q12H Lahoma Crocker, MD   Stopped at 07/11/18 0210  . feeding supplement (ENSURE ENLIVE) (ENSURE ENLIVE) liquid 237 mL  237 mL Oral BID BM Ghimire, Shanker M, MD   237 mL at 07/10/18 0911  . heparin ADULT infusion 100 units/mL (25000 units/273mL sodium chloride 0.45%)  1,850 Units/hr Intravenous Continuous Rumbarger, Faye Ramsay, RPH   Stopped at 07/11/18 1000  . HYDROcodone-homatropine (HYCODAN) 5-1.5 MG/5ML syrup 5 mL  5 mL Oral Q4H PRN Lahoma Crocker, MD      . insulin aspart (novoLOG) injection 0-20 Units  0-20 Units Subcutaneous TID WC Lahoma Crocker, MD   4 Units at 07/10/18 1843  . insulin aspart (novoLOG) injection 0-5 Units  0-5 Units Subcutaneous QHS Lahoma Crocker, MD      . insulin aspart (novoLOG) injection 6 Units  6 Units Subcutaneous TID WC Lahoma Crocker, MD   6 Units at 07/10/18 1844  . LORazepam (ATIVAN) tablet 0.5 mg  0.5 mg Oral QHS PRN Lahoma Crocker, MD      . losartan (COZAAR) tablet 100 mg  100 mg Oral Daily Lahoma Crocker, MD   100 mg at 07/11/18 0837  . MEDLINE mouth rinse  15 mL Mouth Rinse BID Maretta Bees, MD   15 mL at 07/11/18 0844  . pantoprazole (PROTONIX) EC tablet 40 mg  40 mg Oral Daily Lahoma Crocker, MD   40 mg at 07/11/18 0837  . polyethylene glycol (MIRALAX / GLYCOLAX) packet 17 g  17 g Oral Daily PRN Tobey Grim, NP      . potassium chloride SA (K-DUR,KLOR-CON) CR tablet 40 mEq  40 mEq Oral Once Maretta Bees, MD      . senna-docusate (Senokot-S) tablet 2 tablet  2 tablet Oral BID Tobey Grim, NP   2 tablet at 07/10/18 2320  . sodium chloride flush (NS) 0.9 % injection 3 mL  3 mL Intravenous Q12H Lahoma Crocker, MD   3 mL at 07/10/18 2321  . sodium chloride flush (NS) 0.9 % injection 3 mL  3 mL Intravenous PRN Lahoma Crocker, MD      . traMADol Janean Sark) tablet 50 mg  50 mg Oral Q6H PRN Maretta Bees, MD   50 mg at 07/11/18 1027    REVIEW OF SYSTEMS:  On review of systems, the patient reports that she is doing well overall. She currently denies any chest pain, increased shortness of breath, cough, fevers, chills, or night sweats.  She reports that her breathing has improved significantly since the time of admission. She denies any recent unintended weight changes. She denies any bowel or bladder disturbances, and denies abdominal pain, nausea or vomiting. She has not had headaches, dizziness, imbalance, changes in auditory or visual acuity, focal weakness or seizure activity.  She denies any new musculoskeletal or joint aches or pains. A complete review of systems is obtained and is otherwise negative.  PHYSICAL EXAM:  Wt Readings from Last 3 Encounters:  07/06/18 189 lb 13.1 oz (86.1 kg)  07/03/18 188 lb 3.2 oz (85.4 kg)  06/26/18 186 lb 1 oz (84.4 kg)   Temp Readings from Last 3 Encounters:  07/11/18 98.2 F (36.8 C) (Oral)  07/03/18 (!) 97.3 F (36.3 C) (Temporal)    06/26/18 98.6 F (37  C) (Oral)   BP Readings from Last 3 Encounters:  07/11/18 110/68  07/03/18 130/81  06/26/18 (!) 146/82   Pulse Readings from Last 3 Encounters:  07/11/18 88  07/03/18 99  06/26/18 97   Pain Assessment Pain Score: 0-No pain/10  In general this is a well appearing African American female in no acute distress. She is alert and oriented x4 and appropriate throughout the examination. HEENT reveals that the patient is normocephalic, atraumatic. EOMs are intact. PERRLA. Skin is intact without any evidence of gross lesions. Cardiovascular exam reveals a regular rate and rhythm, no clicks rubs or murmurs are auscultated. Chest is clear to auscultation bilaterally. Lymphatic assessment is performed and does not reveal any adenopathy in the cervical, supraclavicular, axillary, or inguinal chains. Abdomen has active bowel sounds in all quadrants and is intact. The abdomen is soft, non tender, non distended. Lower extremities are negative for pretibial pitting edema, deep calf tenderness, cyanosis or clubbing.   KPS = 80  100 - Normal; no complaints; no evidence of disease. 90   - Able to carry on normal activity; minor signs or symptoms of disease. 80   - Normal activity with effort; some signs or symptoms of disease. 75   - Cares for self; unable to carry on normal activity or to do active work. 60   - Requires occasional assistance, but is able to care for most of his personal needs. 50   - Requires considerable assistance and frequent medical care. 40   - Disabled; requires special care and assistance. 30   - Severely disabled; hospital admission is indicated although death not imminent. 20   - Very sick; hospital admission necessary; active supportive treatment necessary. 10   - Moribund; fatal processes progressing rapidly. 0     - Dead  Karnofsky DA, Abelmann WH, Craver LS and Burchenal [facility] [phone]) The use of the nitrogen mustards in the palliative treatment of  carcinoma: with particular reference to bronchogenic carcinoma Cancer 1 634-56  LABORATORY DATA:  Lab Results  Component Value Date   WBC 8.9 07/11/2018   HGB 10.9 (L) 07/11/2018   HCT 32.8 (L) 07/11/2018   MCV 79.4 (L) 07/11/2018   PLT 292 07/11/2018   Lab Results  Component Value Date   NA 132 (L) 07/11/2018   K 2.7 (LL) 07/11/2018   CL 94 (L) 07/11/2018   CO2 28 07/11/2018   Lab Results  Component Value Date   ALT 15 07/06/2018   AST 18 07/06/2018   ALKPHOS 85 07/06/2018   BILITOT 0.9 07/06/2018     RADIOGRAPHY: Dg Chest 2 View  Result Date: 07/06/2018 CLINICAL DATA:  Worsening shortness of breath since last night. Lung cancer. EXAM: CHEST - 2 VIEW COMPARISON:  06/18/2018 PET-CT from 05/14/2018 FINDINGS: Continued right lower lobe airspace opacity. New left lower lobe airspace opacity. Patient has a known posterior mass on the left side. Low lung volumes are present, causing crowding of the pulmonary vasculature. Moderate enlargement of the cardiopericardial silhouette, at least some of which has been shown to be due to pericardial effusion on prior exams. Thoracic spondylosis.  Mild thickening along the minor fissure. IMPRESSION: 1. New airspace opacity in the left lower lobe. Although potentially from pneumonia, if the patient is receiving radiation therapy to the known left lower lobe mass then radiation pneumonitis would be a differential diagnostic consideration. 2. Stable airspace opacity in the right lower lobe. 3. Stable enlargement of the cardiopericardial silhouette, previous exams showed a component of  pericardial effusion. Electronically Signed   By: Gaylyn Rong M.D.   On: 07/06/2018 13:50   Ct Chest W Contrast  Result Date: 07/10/2018 CLINICAL DATA:  68 year old with biopsy-proven adenocarcinoma in the left lower lobe. Patient has been recently admitted for sepsis and pneumonia. Patient also history of pulmonary embolism and on anticoagulation. Plan for follow-up  chest CT to see if the patient would be a candidate for repeat left lung biopsy because additional tissue is needed for molecular testing. EXAM: CT CHEST WITH CONTRAST TECHNIQUE: Multidetector CT imaging of the chest was performed during intravenous contrast administration. CONTRAST:  75mL OMNIPAQUE IOHEXOL 300 MG/ML  SOLN COMPARISON:  CT 07/07/2018 FINDINGS: Cardiovascular: Again noted is a large pericardial effusion which is similar in size to the study on 07/07/2018. Patient has bilateral pulmonary emboli. There is emboli in the distal right pulmonary artery extending into the right upper lobe and right lower lobe branches. There is also emboli in the left lower lobe and lingular branches. The left pulmonary emboli are new since 04/24/2018. Normal caliber of the thoracic aorta. The great vessels are patent. Mediastinum/Nodes: There appears to be marked thickening or enlargement of the esophagus and this is similar to the prior CTs. Again noted is diffuse soft tissue or edema in the mediastinum. Concern for bilateral hilar soft tissue enlargement or lymphadenopathy. There is also increased edema and possible lymphadenopathy in the supraclavicular regions, right side greater than left. Lungs/Pleura: Left pleural effusion has slightly enlarged from the recent comparison examination on 07/07/2018. The lesion in the posterior left lower lobe is poorly characterized due to the adjacent consolidation in left lower lobe. Aeration in the left lower lobe has minimally changed from the recent comparison examination. Persistent sub solid densities along the right lung apex have minimally changed. Again noted is indeterminate nodularity along the right major fissure on sequence 5, image 53 measuring 1.1 x 0.6 cm. Patchy peripheral densities in the right lower lobe have minimally changed from the recent comparison examination. Again noted is a small irregular right pleural effusion. Upper Abdomen: Again noted is thickening and  mild stranding around the bilateral adrenal glands. Images of the upper liver are unremarkable. Musculoskeletal: No acute bone abnormality. IMPRESSION: 1. Bilateral pulmonary emboli. Pulmonary emboli in the left lung is new since 04/24/2018 and difficult to exclude acute thrombus in the right lung. 2. Persistent densities and consolidation in the left lower lung. Findings are compatible with combination of the known left lung lesion, compressive atelectasis and possibly pneumonia. Left pleural effusion has slightly enlarged in size. 3. Persistent pleuroparenchymal disease in the right lower lobe that is minimally changed since 07/07/2017 and probably associated with previous large pulmonary embolism in this area. 4. Large pericardial effusion have not significantly changed since the recent comparison examination. 5. Extensive soft tissue throughout the mediastinum and bilateral hilar regions is concerning for metastatic disease based on the previous PET-CT. There also appears to be diffuse thickening of the esophagus. In addition, there is abnormal soft tissue in the supraclavicular regions and cannot exclude metastatic lymphadenopathy. Recommend further characterization of the supraclavicular lymphadenopathy with ultrasound to see if this would be an adequate site for additional tissue sampling. 6. Persistent thickening of the adrenal glands which is also concerning for metastatic disease. These results were called by telephone at the time of interpretation on 07/10/2018 at 4:45 pm to Dr. Jerral Ralph, who verbally acknowledged these results. Electronically Signed   By: Richarda Overlie M.D.   On: 07/10/2018 16:50  Ct Chest W Contrast  Result Date: 07/07/2018 CLINICAL DATA:  Chest pain.  Short of breath. EXAM: CT CHEST WITH CONTRAST TECHNIQUE: Multidetector CT imaging of the chest was performed during intravenous contrast administration. CONTRAST:  OMNIPAQUE IOHEXOL 300 MG/ML  SOLN COMPARISON:  Chest radiograph,  07/06/2018.  Chest CT, 04/24/2018. FINDINGS: Cardiovascular: Heart is normal in size and configuration. There is a moderate to large pericardial effusion with Hounsfield units averaging between 14 and 27. Great vessels are normal in caliber. No aortic dissection or atherosclerosis. Arch branch vessels are widely patent. Mediastinum/Nodes: No neck base or axillary masses. No axillary adenopathy. There is increased attenuation of the fat throughout the mediastinum with pericardial fluid extending into the superior pericardial recesses. Esophagus is distended with evidence of wall thickening. Esophagus is not well-defined. Previously noted mediastinal and hilar adenopathy is less well-defined but grossly unchanged. Trachea is unremarkable. Lungs/Pleura: Right lower lobe airspace consolidation noted on the prior CT has mostly resolved. There are small bilateral pleural effusions, larger on the left. There is dependent opacity in the lower lobes, left upper lobe lingula and right middle lobe, which may all be due to atelectasis. Atelectasis and a component of infection is possible. The posterior left lower lobe mass is not as well-defined now with contiguous lower lobe opacity. No new lung masses. No discrete nodules. Bronchi to the lower lobes, right middle lobe and left upper lobe lingula are narrowed, somewhat greater than on the prior CT. No pneumothorax. Upper Abdomen: Adrenal gland thickening or masses, similar to the prior CT. No acute findings in the visualized upper abdomen. Musculoskeletal: No fracture or acute finding. No osteoblastic or osteolytic lesions. IMPRESSION: 1. Moderate to large pericardial effusion, increased in size from the prior CT. 2. Mildly dilated, prominent esophagus with evidence of diffuse wall thickening. This is nonspecific. 3. Previously seen right lower lobe consolidation has essentially resolved. Left lower lobe mass is most likely unchanged. It is less well-defined due to contiguous  left lower lobe opacity, the latter finding which may reflect atelectasis, infection or a combination. Additional lung base opacities noted bilaterally that are most likely atelectasis. There is narrowing of the right middle lobe, bilateral lower lobe and left upper lobe lingula bronchi as a pass through infrahilar abnormal soft tissue, presumed to be adenopathy. This bronchial narrowing has increased when compared to the prior study. 4. Small bilateral pleural effusions, larger on the left. Right pleural effusion is smaller than on the prior study and left pleural effusion is new. Electronically Signed   By: Amie Portland M.D.   On: 07/07/2018 15:49   US Soft Tissue Head & Neck (non-thyroid)  Result Date: 07/11/2018 CLINICAL DATA:  68 year old female with adenocarcinoma in the left lower lobe. Recent chest CT demonstrates abnormal soft tissue in the supraclavicular regions. Evaluate for supraclavicular lymphadenopathy and site for additional tissue sampling. EXAM: ULTRASOUND OF HEAD/NECK SOFT TISSUES TECHNIQUE: Ultrasound examination of the head and neck soft tissues was performed in the area of clinical concern. COMPARISON:  Chest CT 07/10/2018 FINDINGS: Abnormal heterogeneous soft tissue on both sides of the neck. Tissue on the right side of the neck appears to be edematous but there is concern for underlying abnormal soft tissue or lymphadenopathy lateral to the right internal jugular vein. These findings correspond with the recent CT findings. On the left side, there is more distinct abnormal soft tissue that is concerning for metastatic disease. Irregular hypoechoic lymph node or lesion just lateral to the left internal jugular vein measuring  1.1 cm in the short axis. There is an irregular hypoechoic collection in the lateral left neck which could represent fluid or necrotic lymph node measuring 1.6 x 1.0 x 1.1 cm. There is another cystic hypoechoic structure along the lateral left neck measuring 1.2 x 0.8  x 1.4 cm. IMPRESSION: Irregular heterogeneous soft tissue on both sides of the lower neck. Findings are most compatible with metastatic lymphadenopathy based on the recent chest CT. There are small cystic or necrotic lymph nodes on the left side of the neck as described. Electronically Signed   By: Richarda Overlie M.D.   On: 07/11/2018 07:50   Ct Biopsy  Result Date: 06/18/2018 INDICATION: Left lower lobe hypermetabolic lung mass EXAM: CT BIOPSY MEDICATIONS: None. ANESTHESIA/SEDATION: Fentanyl 100 mcg IV; Versed 2 mg IV Moderate Sedation Time:  13 minutes The patient was continuously monitored during the procedure by the interventional radiology nurse under my direct supervision. FLUOROSCOPY TIME:  None COMPLICATIONS: None immediate. PROCEDURE: Informed written consent was obtained from the patient after a thorough discussion of the procedural risks, benefits and alternatives. All questions were addressed. Maximal Sterile Barrier Technique was utilized including caps, mask, sterile gowns, sterile gloves, sterile drape, hand hygiene and skin antiseptic. A timeout was performed prior to the initiation of the procedure. Under CT guidance, a(n) 17 gauge guide needle was advanced into the left lower lobe lung mass. Subsequently, 3 18 gauge core biopsies were obtained. A collagen plug was deployed in the needle tract. The guide needle was removed. Post biopsy images demonstrate no pneumothorax. Patient tolerated the procedure well without complication. Vital sign monitoring by nursing staff during the procedure will continue as patient is in the special procedures unit for post procedure observation. FINDINGS: The images document guide needle placement within the left lower lobe lung mass. Post biopsy images demonstrate no pneumothorax. IMPRESSION: Successful CT-guided core biopsy of a left lower lobe lung mass. Electronically Signed   By: Jolaine Click M.D.   On: 06/18/2018 12:17   Dg Chest Port 1 View  Result Date:  06/18/2018 CLINICAL DATA:  Status post left lung biopsy EXAM: PORTABLE CHEST 1 VIEW COMPARISON:  CT from earlier in the same day. FINDINGS: Cardiac shadow is enlarged but stable. Left lung base is again identified retrocardiac position. No underlying pneumothorax is seen. Persistent density in the right base stable from the prior CT is noted. No bony abnormality is noted. IMPRESSION: No pneumothorax following lung biopsy on the left. Electronically Signed   By: Alcide Clever M.D.   On: 06/18/2018 13:25   Vas [place] Lower Extremity Venous (dvt)  Result Date: 07/11/2018  Lower Venous Study Indications: Pulmonary embolism.  Performing Technologist: Levada Schilling RDMS, RVT  Examination Guidelines: A complete evaluation includes B-mode imaging, spectral Doppler, color Doppler, and power Doppler as needed of all accessible portions of each vessel. Bilateral testing is considered an integral part of a complete examination. Limited examinations for reoccurring indications may be performed as noted.  Right Venous Findings: +---------+---------------+---------+-----------+----------+-------+          CompressibilityPhasicitySpontaneityPropertiesSummary +---------+---------------+---------+-----------+----------+-------+ CFV      Full           Yes      Yes                          +---------+---------------+---------+-----------+----------+-------+ SFJ      Full                                                 +---------+---------------+---------+-----------+----------+-------+  FV Prox  Full                                                 +---------+---------------+---------+-----------+----------+-------+ FV Mid   Full                                                 +---------+---------------+---------+-----------+----------+-------+ FV DistalFull                                                 +---------+---------------+---------+-----------+----------+-------+ PFV      Full                                                  +---------+---------------+---------+-----------+----------+-------+ POP      Full           Yes      Yes                          +---------+---------------+---------+-----------+----------+-------+ PTV      Full                                                 +---------+---------------+---------+-----------+----------+-------+ PERO     Full                                                 +---------+---------------+---------+-----------+----------+-------+ GSV      Full                                                 +---------+---------------+---------+-----------+----------+-------+  Left Venous Findings: +---------+---------------+---------+-----------+----------+------------------+          CompressibilityPhasicitySpontaneityPropertiesSummary            +---------+---------------+---------+-----------+----------+------------------+ CFV      Full           Yes      Yes                                     +---------+---------------+---------+-----------+----------+------------------+ SFJ      Full                                                            +---------+---------------+---------+-----------+----------+------------------+ FV Prox  Full                                                            +---------+---------------+---------+-----------+----------+------------------+  FV Mid   Full                                                            +---------+---------------+---------+-----------+----------+------------------+ FV DistalFull                                                            +---------+---------------+---------+-----------+----------+------------------+ PFV      Full                                                            +---------+---------------+---------+-----------+----------+------------------+ POP      Full           Yes      Yes                   slow flow, no                                                            thrombus           +---------+---------------+---------+-----------+----------+------------------+ PTV      Full                                                            +---------+---------------+---------+-----------+----------+------------------+ PERO     Full                                                            +---------+---------------+---------+-----------+----------+------------------+ GSV      Full                                                            +---------+---------------+---------+-----------+----------+------------------+    Summary: Right: There is no evidence of deep vein thrombosis in the lower extremity. No cystic structure found in the popliteal fossa. Left: There is no evidence of deep vein thrombosis in the lower extremity. No cystic structure found in the popliteal fossa.  *See table(s) above for measurements and observations.    Preliminary       IMPRESSION/PLAN: 1. 68 y.o. female with newly diagnosed metastatic NSCLC of the left lower lobe lung with significant mediastinal/hilar lymph node involvement and at least 20  subcentimeter brain metastases. Today, I talked to the patient and family about the findings and workup thus far. We discussed the natural history of metastatic NSCLC and general treatment, highlighting the role of radiotherapy in the management. We discussed the available radiation techniques, and focused on the details of logistics and delivery.  The recommendation is to proceed with a 2-week course of palliative radiotherapy to the chest as well as whole brain radiotherapy for treatment of her multiple brain metastases.  We reviewed the anticipated acute and late sequelae associated with radiation in this setting. She understands that whole brain radiotherapy would treat the known metastatic deposits and help provide some reduction of risk for future  brain metastases. The patient and her family were encouraged to ask questions that were answered to their stated satisfaction.  At the conclusion of our conversation, the patient elects to proceed with palliative chest radiotherapy and whole brain radiation as recommended.  I have spoken with her attending physician, Dr. Jerral Ralph and the current plan is to proceed with biopsy for molecular testing today with plans for discharge home on 07/12/2018 pending all goes smoothly.  We will tentatively schedule her for CT simulation on Monday, 07/14/2018 at 8 AM in anticipation of beginning treatment later in the week.  Her sister, Marylu Lund, serves as her primary source of transportation and is in agreement with the stated plan.  Marylu Lund can be contacted at [phone] or [phone].  They know to call with any questions or concerns in the interim.  Marguarite Arbour, PA-C    Margaretmary Dys, MD  [facility]  Radiation Oncology Direct Dial: [phone]  Fax: [phone] [email or web address]  Skype  LinkedIn

## 2018-07-11 NOTE — Progress Notes (Signed)
Benefit check in process for Lovenox 130 mg daily.  Whitman Hero RN,BSN,CM

## 2018-07-11 NOTE — Progress Notes (Signed)
PROGRESS NOTE        PATIENT DETAILS Name: Kaitlin [name] Age: 68 y.o. Sex: female Date of Birth: 02-14-1951 Admit Date: 07/06/2018 Admitting Physician Lahoma Crocker, MD QMV:HQION, Channing Mutters, MD  Brief Narrative: Patient is a 68 y.o. female with with recent diagnosis of venous thromboembolism on Eliquis (November 2019) and adenocarcinoma of the lung-s/p CT-guided lung biopsy on 1/22-followed by oncology at [facility] (not yet started on treatment-with plans as outpatient for repeat biopsy for more mutation/genetic testing) presented to the hospital for sepsis secondary to postobstructive pneumonia.  Upon hospitalization-further evaluation revealed a large pericardial effusion without tamponade features, and upon repeat imaging of the chest by interventional radiology (before repeat lung biopsy) found to have new left pulmonary embolism (prior pulmonary embolism on the right side) and a new left popliteal vein DVT.  Patient was also found to have worsening lymphadenopathy, and underwent a ultrasound guided core biopsy of lymph node on the right side of the neck (instead of repeat lung biopsy) on 2/14.See below for further details.  Subjective: Left-sided pleuritic chest pain has markedly improved-she still has some pain but mostly on improvement.  Assessment/Plan: Sepsis secondary to left lobar pneumonia-postobstructive pneumonia: Slowly improved-sepsis pathophysiology has resolved, no further leukocytosis.  Blood cultures are negative.  Respiratory virus panel negative.  Stop cefepime-transition to Augmentin-we will plan on just 2 additional days of treatment.  Pericardial effusion: Seen on CT chest done on 2/10, confirmed on echocardiogram-although effusion has increased compared to most recent echocardiogram, no tamponade physiology seen.  Appreciate cardiology input by Dr. Jacinto Halim.  We will continue to monitor closely.  Suspect that this is likely malignant pericardial effusion in  the setting of stage IV lung cancer.    History of right-sided pulmonary embolism and bilateral DVT November 2019: On Eliquis as outpatient-bridged to IV heparin-as plans were to pursue inpatient repeat lung biopsy and possible Port-A-Cath placement.  Underwent repeat CT chest on 2/13 before proceeding with lung biopsy-surprisingly found to have a new left lung pulmonary embolism (prior PE was right-sided) and a lower extremity Doppler showed a new thrombus in the left popliteal vein (not present in November 2019).  Patient has been on IV heparin during this hospital stay-and has not missed any doses of Eliquis in the outpatient setting.  I discussed these new findings with patient's primary oncologist Dr. Ellin Saba over the phone, given possibility of anticoagulant failure with Eliquis, recommends that we now switch her over to subcutaneous Lovenox instead of Eliquis.  Had spoken with IR PA-C. Rose Phi earlier this morning-recommendations were to resume IV heparin post biopsy on 2/14-and then to consider transitioning to Lovenox starting on 2/14.  Have asked case management to evaluate patient to make sure she has no insurance issues with long-term Lovenox treatment.  Per Dr. Ellin Saba, no need for IVC filter at this point.  Stage IV adenocarcinoma of the lung: Recently diagnosed with adenocarcinoma of the lung-did undergo a lung biopsy on 1/22-but needs more tissue for genetic testing.  IR consulted during hospital stay-with plans for repeat lung biopsy and Port-A-Cath placement on 2/14-however upon further evaluation by a repeat CT chest on 2/13-was found to have neck adenopathy.  IR subsequently performed a right neck core lymph node biopsy on 2/14.  IR unable to place Port-A-Cath on 2/14-recommendations are to reconsult IR in the outpatient setting for Port-A-Cath placement.  Also was contacted by radiation oncology- PA-C Olivia Mackie will arrange for patient to follow-up with them to begin  palliative radiation for early next week.  Hypokalemia: Continue to replete-hold chlorthalidone for now.  Recheck electrolytes tomorrow  Hypertension: Blood pressure controlled-continue chlorthalidone, losartan  DVT Prophylaxis: Full dose anticoagulation with Heparin-with plans to transition to Lovenox tomorrow.  Code Status: Full code   Family Communication: None at bedside  Disposition Plan: Remain inpatient-home on 2/15  Antimicrobial agents: Anti-infectives (From admission, onward)   Start     Dose/Rate Route Frequency Ordered Stop   07/11/18 1400  amoxicillin-clavulanate (AUGMENTIN) 875-125 MG per tablet 1 tablet     1 tablet Oral Every 12 hours 07/11/18 1355     07/07/18 1500  vancomycin (VANCOCIN) IVPB 1000 mg/200 mL premix  Status:  Discontinued     1,000 mg 200 mL/hr over 60 Minutes Intravenous Every 24 hours 07/06/18 1346 07/09/18 0751   07/07/18 0200  ceFEPIme (MAXIPIME) 2 g in sodium chloride 0.9 % 100 mL IVPB  Status:  Discontinued     2 g 200 mL/hr over 30 Minutes Intravenous Every 12 hours 07/06/18 1346 07/11/18 1355   07/06/18 1300  vancomycin (VANCOCIN) 1,750 mg in sodium chloride 0.9 % 500 mL IVPB     1,750 mg 250 mL/hr over 120 Minutes Intravenous STAT 07/06/18 1250 07/06/18 1826   07/06/18 1245  vancomycin (VANCOCIN) IVPB 1000 mg/200 mL premix  Status:  Discontinued     1,000 mg 200 mL/hr over 60 Minutes Intravenous  Once 07/06/18 1238 07/06/18 1250   07/06/18 1245  ceFEPIme (MAXIPIME) 2 g in sodium chloride 0.9 % 100 mL IVPB     2 g 200 mL/hr over 30 Minutes Intravenous  Once 07/06/18 1238 07/06/18 1457      Procedures: None  CONSULTS:  IR  Time spent: 25 minutes-Greater than 50% of this time was spent in counseling, explanation of diagnosis, planning of further management, and coordination of care.  MEDICATIONS: Scheduled Meds: . amoxicillin-clavulanate  1 tablet Oral Q12H  . feeding supplement (ENSURE ENLIVE)  237 mL Oral BID BM  . fentaNYL       . insulin aspart  0-20 Units Subcutaneous TID WC  . insulin aspart  0-5 Units Subcutaneous QHS  . insulin aspart  6 Units Subcutaneous TID WC  . lidocaine-EPINEPHrine      . losartan  100 mg Oral Daily  . mouth rinse  15 mL Mouth Rinse BID  . midazolam      . pantoprazole  40 mg Oral Daily  . potassium chloride  40 mEq Oral Once  . senna-docusate  2 tablet Oral BID  . sodium chloride flush  3 mL Intravenous Q12H   Continuous Infusions: . sodium chloride 50 mL/hr at 07/11/18 3016  . heparin Stopped (07/11/18 1000)   PRN Meds:.sodium chloride, albuterol, HYDROcodone-homatropine, LORazepam, polyethylene glycol, sodium chloride flush, traMADol   PHYSICAL EXAM: Vital signs: Vitals:   07/11/18 1332 07/11/18 1335 07/11/18 1340 07/11/18 1344  BP: 135/73 118/66 125/69 99/69  Pulse: 89 89 86 86  Resp: 16 18 16 18   Temp:      TempSrc:      SpO2: 100% 96% 96% 97%  Weight:      Height:       Filed Weights   07/06/18 1225 07/06/18 1807  Weight: 87.5 kg 86.1 kg   Body mass index is 30.64 kg/m.   General appearance:Awake, alert, not in any distress.  Eyes:no scleral icterus. HEENT:  Atraumatic and Normocephalic Neck: supple, no JVD. Resp:Good air entry bilaterally,no rales or rhonchi CVS: S1 S2 regular, no murmurs.  GI: Bowel sounds present, Non tender and not distended with no gaurding, rigidity or rebound. Extremities: B/L Lower Ext shows no edema, both legs are warm to touch Neurology:  Non focal Psychiatric: Normal judgment and insight. Normal mood. Musculoskeletal:No digital cyanosis Skin:No Rash, warm and dry Wounds:N/A  I have personally reviewed following labs and imaging studies  LABORATORY DATA: CBC: Recent Labs  Lab 07/06/18 1239 07/07/18 0343 07/08/18 0353 07/09/18 0413 07/10/18 0426 07/11/18 0347  WBC 18.2* 19.1* 16.5* 9.0 6.8 8.9  NEUTROABS 14.7*  --   --   --   --   --   HGB 12.7 11.4* 10.5* 10.6* 11.1* 10.9*  HCT 39.7 35.8* 32.7* 33.0* 35.0*  32.8*  MCV 81.0 81.0 80.3 80.5 80.6 79.4*  PLT 243 209 240 270 344 292    Basic Metabolic Panel: Recent Labs  Lab 07/06/18 1239 07/07/18 0343 07/09/18 0413 07/11/18 0347  NA 132* 133* 132* 132*  K 3.0* 3.2* 3.0* 2.7*  CL 94* 100 93* 94*  CO2 22 23 28 28   GLUCOSE 163* 196* 95 119*  BUN 12 12 14 9   CREATININE 1.07* 0.97 0.89 0.79  CALCIUM 9.6 8.7* 9.1 9.1  MG 1.4*  --  1.7  --     GFR: Estimated Creatinine Clearance: 75.4 mL/min (by C-G formula based on SCr of 0.79 mg/dL).  Liver Function Tests: Recent Labs  Lab 07/06/18 1239  AST 18  ALT 15  ALKPHOS 85  BILITOT 0.9  PROT 7.6  ALBUMIN 3.6   No results for input(s): LIPASE, AMYLASE in the last 168 hours. No results for input(s): AMMONIA in the last 168 hours.  Coagulation Profile: Recent Labs  Lab 07/09/18 1637  INR 1.25    Cardiac Enzymes: Recent Labs  Lab 07/06/18 1239  TROPONINI <0.03    BNP (last 3 results) No results for input(s): PROBNP in the last 8760 hours.  HbA1C: No results for input(s): HGBA1C in the last 72 hours.  CBG: Recent Labs  Lab 07/10/18 1236 07/10/18 1703 07/10/18 2325 07/11/18 0831 07/11/18 1151  GLUCAP 142* 161* 71 101* 95    Lipid Profile: No results for input(s): CHOL, HDL, LDLCALC, TRIG, CHOLHDL, LDLDIRECT in the last 72 hours.  Thyroid Function Tests: No results for input(s): TSH, T4TOTAL, FREET4, T3FREE, THYROIDAB in the last 72 hours.  Anemia Panel: No results for input(s): VITAMINB12, FOLATE, FERRITIN, TIBC, IRON, RETICCTPCT in the last 72 hours.  Urine analysis:    Component Value Date/Time   COLORURINE YELLOW 07/06/2018 1555   APPEARANCEUR CLEAR 07/06/2018 1555   LABSPEC 1.012 07/06/2018 1555   PHURINE 6.0 07/06/2018 1555   GLUCOSEU NEGATIVE 07/06/2018 1555   HGBUR MODERATE (A) 07/06/2018 1555   BILIRUBINUR NEGATIVE 07/06/2018 1555   KETONESUR NEGATIVE 07/06/2018 1555   PROTEINUR NEGATIVE 07/06/2018 1555   UROBILINOGEN 0.2 04/11/2007 1936    NITRITE NEGATIVE 07/06/2018 1555   LEUKOCYTESUR MODERATE (A) 07/06/2018 1555    Sepsis Labs: Lactic Acid, Venous    Component Value Date/Time   LATICACIDVEN 1.4 07/06/2018 1836    MICROBIOLOGY: Recent Results (from the past 240 hour(s))  Blood Culture (routine x 2)     Status: None   Collection Time: 07/06/18 12:50 PM  Result Value Ref Range Status   Specimen Description BLOOD RIGHT ANTECUBITAL  Final   Special Requests   Final    BOTTLES DRAWN AEROBIC AND  ANAEROBIC Blood Culture adequate volume   Culture   Final    NO GROWTH 5 DAYS Performed at [facility], [address], [place], [place] [address]    Report Status 07/11/2018 FINAL  Final  Blood Culture (routine x 2)     Status: None   Collection Time: 07/06/18 12:58 PM  Result Value Ref Range Status   Specimen Description BLOOD RIGHT HAND  Final   Special Requests   Final    BOTTLES DRAWN AEROBIC AND ANAEROBIC Blood Culture adequate volume   Culture   Final    NO GROWTH 5 DAYS Performed at [facility], [address] [place], [place] [address]    Report Status 07/11/2018 FINAL  Final  Respiratory Panel by PCR     Status: None   Collection Time: 07/07/18 10:20 AM  Result Value Ref Range Status   Adenovirus NOT DETECTED NOT DETECTED Final   Coronavirus 229E NOT DETECTED NOT DETECTED Final    Comment: (NOTE) The Coronavirus on the Respiratory Panel, DOES NOT test for the novel  Coronavirus (2019 nCoV)    Coronavirus HKU1 NOT DETECTED NOT DETECTED Final   Coronavirus NL63 NOT DETECTED NOT DETECTED Final   Coronavirus OC43 NOT DETECTED NOT DETECTED Final   Metapneumovirus NOT DETECTED NOT DETECTED Final   Rhinovirus / Enterovirus NOT DETECTED NOT DETECTED Final   Influenza A NOT DETECTED NOT DETECTED Final   Influenza B NOT DETECTED NOT DETECTED Final   Parainfluenza Virus 1 NOT DETECTED NOT DETECTED Final   Parainfluenza Virus 2 NOT DETECTED NOT DETECTED Final   Parainfluenza Virus 3 NOT DETECTED NOT  DETECTED Final   Parainfluenza Virus 4 NOT DETECTED NOT DETECTED Final   Respiratory Syncytial Virus NOT DETECTED NOT DETECTED Final   Bordetella pertussis NOT DETECTED NOT DETECTED Final   Chlamydophila pneumoniae NOT DETECTED NOT DETECTED Final   Mycoplasma pneumoniae NOT DETECTED NOT DETECTED Final    Comment: Performed at [facility], [address], [place], [place] [address]    RADIOLOGY STUDIES/RESULTS: Dg Chest 2 View  Result Date: 07/06/2018 CLINICAL DATA:  Worsening shortness of breath since last night. Lung cancer. EXAM: CHEST - 2 VIEW COMPARISON:  06/18/2018 PET-CT from 05/14/2018 FINDINGS: Continued right lower lobe airspace opacity. New left lower lobe airspace opacity. Patient has a known posterior mass on the left side. Low lung volumes are present, causing crowding of the pulmonary vasculature. Moderate enlargement of the cardiopericardial silhouette, at least some of which has been shown to be due to pericardial effusion on prior exams. Thoracic spondylosis.  Mild thickening along the minor fissure. IMPRESSION: 1. New airspace opacity in the left lower lobe. Although potentially from pneumonia, if the patient is receiving radiation therapy to the known left lower lobe mass then radiation pneumonitis would be a differential diagnostic consideration. 2. Stable airspace opacity in the right lower lobe. 3. Stable enlargement of the cardiopericardial silhouette, previous exams showed a component of pericardial effusion. Electronically Signed   By: Gaylyn Rong M.D.   On: 07/06/2018 13:50   Ct Chest W Contrast  Result Date: 07/10/2018 CLINICAL DATA:  68 year old with biopsy-proven adenocarcinoma in the left lower lobe. Patient has been recently admitted for sepsis and pneumonia. Patient also history of pulmonary embolism and on anticoagulation. Plan for follow-up chest CT to see if the patient would be a candidate for repeat left lung biopsy because additional tissue is needed  for molecular testing. EXAM: CT CHEST WITH CONTRAST TECHNIQUE:  Multidetector CT imaging of the chest was performed during intravenous contrast administration. CONTRAST:  75mL OMNIPAQUE IOHEXOL 300 MG/ML  SOLN COMPARISON:  CT 07/07/2018 FINDINGS: Cardiovascular: Again noted is a large pericardial effusion which is similar in size to the study on 07/07/2018. Patient has bilateral pulmonary emboli. There is emboli in the distal right pulmonary artery extending into the right upper lobe and right lower lobe branches. There is also emboli in the left lower lobe and lingular branches. The left pulmonary emboli are new since 04/24/2018. Normal caliber of the thoracic aorta. The great vessels are patent. Mediastinum/Nodes: There appears to be marked thickening or enlargement of the esophagus and this is similar to the prior CTs. Again noted is diffuse soft tissue or edema in the mediastinum. Concern for bilateral hilar soft tissue enlargement or lymphadenopathy. There is also increased edema and possible lymphadenopathy in the supraclavicular regions, right side greater than left. Lungs/Pleura: Left pleural effusion has slightly enlarged from the recent comparison examination on 07/07/2018. The lesion in the posterior left lower lobe is poorly characterized due to the adjacent consolidation in left lower lobe. Aeration in the left lower lobe has minimally changed from the recent comparison examination. Persistent sub solid densities along the right lung apex have minimally changed. Again noted is indeterminate nodularity along the right major fissure on sequence 5, image 53 measuring 1.1 x 0.6 cm. Patchy peripheral densities in the right lower lobe have minimally changed from the recent comparison examination. Again noted is a small irregular right pleural effusion. Upper Abdomen: Again noted is thickening and mild stranding around the bilateral adrenal glands. Images of the upper liver are unremarkable. Musculoskeletal: No  acute bone abnormality. IMPRESSION: 1. Bilateral pulmonary emboli. Pulmonary emboli in the left lung is new since 04/24/2018 and difficult to exclude acute thrombus in the right lung. 2. Persistent densities and consolidation in the left lower lung. Findings are compatible with combination of the known left lung lesion, compressive atelectasis and possibly pneumonia. Left pleural effusion has slightly enlarged in size. 3. Persistent pleuroparenchymal disease in the right lower lobe that is minimally changed since 07/07/2017 and probably associated with previous large pulmonary embolism in this area. 4. Large pericardial effusion have not significantly changed since the recent comparison examination. 5. Extensive soft tissue throughout the mediastinum and bilateral hilar regions is concerning for metastatic disease based on the previous PET-CT. There also appears to be diffuse thickening of the esophagus. In addition, there is abnormal soft tissue in the supraclavicular regions and cannot exclude metastatic lymphadenopathy. Recommend further characterization of the supraclavicular lymphadenopathy with ultrasound to see if this would be an adequate site for additional tissue sampling. 6. Persistent thickening of the adrenal glands which is also concerning for metastatic disease. These results were called by telephone at the time of interpretation on 07/10/2018 at 4:45 pm to Dr. Jerral Ralph, who verbally acknowledged these results. Electronically Signed   By: Richarda Overlie M.D.   On: 07/10/2018 16:50   Ct Chest W Contrast  Result Date: 07/07/2018 CLINICAL DATA:  Chest pain.  Short of breath. EXAM: CT CHEST WITH CONTRAST TECHNIQUE: Multidetector CT imaging of the chest was performed during intravenous contrast administration. CONTRAST:  OMNIPAQUE IOHEXOL 300 MG/ML  SOLN COMPARISON:  Chest radiograph, 07/06/2018.  Chest CT, 04/24/2018. FINDINGS: Cardiovascular: Heart is normal in size and configuration. There is a  moderate to large pericardial effusion with Hounsfield units averaging between 14 and 27. Great vessels are normal in caliber. No aortic dissection or atherosclerosis.  Arch branch vessels are widely patent. Mediastinum/Nodes: No neck base or axillary masses. No axillary adenopathy. There is increased attenuation of the fat throughout the mediastinum with pericardial fluid extending into the superior pericardial recesses. Esophagus is distended with evidence of wall thickening. Esophagus is not well-defined. Previously noted mediastinal and hilar adenopathy is less well-defined but grossly unchanged. Trachea is unremarkable. Lungs/Pleura: Right lower lobe airspace consolidation noted on the prior CT has mostly resolved. There are small bilateral pleural effusions, larger on the left. There is dependent opacity in the lower lobes, left upper lobe lingula and right middle lobe, which may all be due to atelectasis. Atelectasis and a component of infection is possible. The posterior left lower lobe mass is not as well-defined now with contiguous lower lobe opacity. No new lung masses. No discrete nodules. Bronchi to the lower lobes, right middle lobe and left upper lobe lingula are narrowed, somewhat greater than on the prior CT. No pneumothorax. Upper Abdomen: Adrenal gland thickening or masses, similar to the prior CT. No acute findings in the visualized upper abdomen. Musculoskeletal: No fracture or acute finding. No osteoblastic or osteolytic lesions. IMPRESSION: 1. Moderate to large pericardial effusion, increased in size from the prior CT. 2. Mildly dilated, prominent esophagus with evidence of diffuse wall thickening. This is nonspecific. 3. Previously seen right lower lobe consolidation has essentially resolved. Left lower lobe mass is most likely unchanged. It is less well-defined due to contiguous left lower lobe opacity, the latter finding which may reflect atelectasis, infection or a combination. Additional  lung base opacities noted bilaterally that are most likely atelectasis. There is narrowing of the right middle lobe, bilateral lower lobe and left upper lobe lingula bronchi as a pass through infrahilar abnormal soft tissue, presumed to be adenopathy. This bronchial narrowing has increased when compared to the prior study. 4. Small bilateral pleural effusions, larger on the left. Right pleural effusion is smaller than on the prior study and left pleural effusion is new. Electronically Signed   By: Amie Portland M.D.   On: 07/07/2018 15:49   US Soft Tissue Head & Neck (non-thyroid)  Result Date: 07/11/2018 CLINICAL DATA:  68 year old female with adenocarcinoma in the left lower lobe. Recent chest CT demonstrates abnormal soft tissue in the supraclavicular regions. Evaluate for supraclavicular lymphadenopathy and site for additional tissue sampling. EXAM: ULTRASOUND OF HEAD/NECK SOFT TISSUES TECHNIQUE: Ultrasound examination of the head and neck soft tissues was performed in the area of clinical concern. COMPARISON:  Chest CT 07/10/2018 FINDINGS: Abnormal heterogeneous soft tissue on both sides of the neck. Tissue on the right side of the neck appears to be edematous but there is concern for underlying abnormal soft tissue or lymphadenopathy lateral to the right internal jugular vein. These findings correspond with the recent CT findings. On the left side, there is more distinct abnormal soft tissue that is concerning for metastatic disease. Irregular hypoechoic lymph node or lesion just lateral to the left internal jugular vein measuring 1.1 cm in the short axis. There is an irregular hypoechoic collection in the lateral left neck which could represent fluid or necrotic lymph node measuring 1.6 x 1.0 x 1.1 cm. There is another cystic hypoechoic structure along the lateral left neck measuring 1.2 x 0.8 x 1.4 cm. IMPRESSION: Irregular heterogeneous soft tissue on both sides of the lower neck. Findings are most  compatible with metastatic lymphadenopathy based on the recent chest CT. There are small cystic or necrotic lymph nodes on the left side of the  neck as described. Electronically Signed   By: Richarda Overlie M.D.   On: 07/11/2018 07:50   Ct Biopsy  Result Date: 06/18/2018 INDICATION: Left lower lobe hypermetabolic lung mass EXAM: CT BIOPSY MEDICATIONS: None. ANESTHESIA/SEDATION: Fentanyl 100 mcg IV; Versed 2 mg IV Moderate Sedation Time:  13 minutes The patient was continuously monitored during the procedure by the interventional radiology nurse under my direct supervision. FLUOROSCOPY TIME:  None COMPLICATIONS: None immediate. PROCEDURE: Informed written consent was obtained from the patient after a thorough discussion of the procedural risks, benefits and alternatives. All questions were addressed. Maximal Sterile Barrier Technique was utilized including caps, mask, sterile gowns, sterile gloves, sterile drape, hand hygiene and skin antiseptic. A timeout was performed prior to the initiation of the procedure. Under CT guidance, a(n) 17 gauge guide needle was advanced into the left lower lobe lung mass. Subsequently, 3 18 gauge core biopsies were obtained. A collagen plug was deployed in the needle tract. The guide needle was removed. Post biopsy images demonstrate no pneumothorax. Patient tolerated the procedure well without complication. Vital sign monitoring by nursing staff during the procedure will continue as patient is in the special procedures unit for post procedure observation. FINDINGS: The images document guide needle placement within the left lower lobe lung mass. Post biopsy images demonstrate no pneumothorax. IMPRESSION: Successful CT-guided core biopsy of a left lower lobe lung mass. Electronically Signed   By: Jolaine Click M.D.   On: 06/18/2018 12:17   Dg Chest Port 1 View  Result Date: 06/18/2018 CLINICAL DATA:  Status post left lung biopsy EXAM: PORTABLE CHEST 1 VIEW COMPARISON:  CT from  earlier in the same day. FINDINGS: Cardiac shadow is enlarged but stable. Left lung base is again identified retrocardiac position. No underlying pneumothorax is seen. Persistent density in the right base stable from the prior CT is noted. No bony abnormality is noted. IMPRESSION: No pneumothorax following lung biopsy on the left. Electronically Signed   By: Alcide Clever M.D.   On: 06/18/2018 13:25   Vas [place] Lower Extremity Venous (dvt)  Result Date: 07/11/2018  Lower Venous Study Indications: Pulmonary embolism.  Performing Technologist: Levada Schilling RDMS, RVT  Examination Guidelines: A complete evaluation includes B-mode imaging, spectral Doppler, color Doppler, and power Doppler as needed of all accessible portions of each vessel. Bilateral testing is considered an integral part of a complete examination. Limited examinations for reoccurring indications may be performed as noted.  Right Venous Findings: +---------+---------------+---------+-----------+----------+-------+          CompressibilityPhasicitySpontaneityPropertiesSummary +---------+---------------+---------+-----------+----------+-------+ CFV      Full           Yes      Yes                          +---------+---------------+---------+-----------+----------+-------+ SFJ      Full                                                 +---------+---------------+---------+-----------+----------+-------+ FV Prox  Full                                                 +---------+---------------+---------+-----------+----------+-------+ FV Mid   Full                                                 +---------+---------------+---------+-----------+----------+-------+  FV DistalFull                                                 +---------+---------------+---------+-----------+----------+-------+ PFV      Full                                                  +---------+---------------+---------+-----------+----------+-------+ POP      Full           Yes      Yes                          +---------+---------------+---------+-----------+----------+-------+ PTV      Full                                                 +---------+---------------+---------+-----------+----------+-------+ PERO     Full                                                 +---------+---------------+---------+-----------+----------+-------+ GSV      Full                                                 +---------+---------------+---------+-----------+----------+-------+  Left Venous Findings: +---------+---------------+---------+-----------+----------+------------------+          CompressibilityPhasicitySpontaneityPropertiesSummary            +---------+---------------+---------+-----------+----------+------------------+ CFV      Full           Yes      Yes                                     +---------+---------------+---------+-----------+----------+------------------+ SFJ      Full                                                            +---------+---------------+---------+-----------+----------+------------------+ FV Prox  Full                                                            +---------+---------------+---------+-----------+----------+------------------+ FV Mid   Full                                                            +---------+---------------+---------+-----------+----------+------------------+  FV DistalFull                                                            +---------+---------------+---------+-----------+----------+------------------+ PFV      Full                                                            +---------+---------------+---------+-----------+----------+------------------+ POP      Full           Yes      Yes                  slow flow, no                                                             thrombus           +---------+---------------+---------+-----------+----------+------------------+ PTV      Full                                                            +---------+---------------+---------+-----------+----------+------------------+ PERO     Full                                                            +---------+---------------+---------+-----------+----------+------------------+ GSV      Full                                                            +---------+---------------+---------+-----------+----------+------------------+    Summary: Right: There is no evidence of deep vein thrombosis in the lower extremity. No cystic structure found in the popliteal fossa. Left: There is no evidence of deep vein thrombosis in the lower extremity. No cystic structure found in the popliteal fossa.  *See table(s) above for measurements and observations.    Preliminary      LOS: 5 days   Jeoffrey Massed, MD  [facility]  If 7PM-7AM, please contact night-coverage  Please page via [email or web address]  Go to [email or web address] and use [facility] universal password to access. If you do not have the password, please contact the hospital operator.  Locate the [facility] provider you are looking for under [facility] and page to a number that you can be directly reached. If you still have difficulty reaching the provider, please page the [facility] (Director on Call) for the Hospitalists listed on amion for assistance.  07/11/2018, 1:55 PM

## 2018-07-11 NOTE — Progress Notes (Signed)
Initial Nutrition Assessment  DOCUMENTATION CODES:   Not applicable  INTERVENTION:   Magic cup TID with meals, each supplement provides 290 kcal and 9 grams of protein  Ensure Enlive po BID, each supplement provides 350 kcal and 20 grams of protein  Liberalize diet to regular   NUTRITION DIAGNOSIS:   Increased nutrient needs related to cancer and cancer related treatments, other (see comment), acute illness(Sepsis) as evidenced by estimated needs.   GOAL:   Patient will meet greater than or equal to 90% of their needs  MONITOR:   PO intake, Supplement acceptance, Weight trends  REASON FOR ASSESSMENT:   Malnutrition Screening Tool    ASSESSMENT:   Pt is 74y F with PMH of Adenocarcinoma of lung, pulmonary embolism with long-term anticoagulant therapy, DM, HTN, and R Lung Cancer. Pt admitted on SOB, tachycardia, and Sepsis Secondary to L lobar Pneumonia.    Pt is on Ensure Enlive. 2/14 Pt with neck adenopathy R neck lymphoid  Biopsy. Noted: plan for palliative radiation next week.     Pt states that appetite was good prior to being admitted and will eat anything. Once admitted and on heart healthy/CHO mod diet, her appetite has decreased. Pt drinks Boost plus strawberry at home BID, one in am and one in pm. Her typical day includes eggs with cheese, bacon, and grits for breakfast. Lunch is typically out to eat (to help get her out of the house). Pt says she will eat anywhere, examples given were outback and Development worker, community. For dinner its at home, with items like Chicken alfredo or meatloaf, turnip greens, etc. Pt does not take any MVIs.   Pt states that her UBW is 213lbs (96.8kg) and has lost weight since October 2019 which is reflected in wt encounters. Her current wt is 189.4lbs (86.1kg) which is a 4% wt loss since.    Pt also noted that with the mass in her neck it was harder for her to swallow, but states that this has not affected what and how much she eats.   Labs reviewed:  Potassium 2.7 (L)  Medications reviewed and include:  NovoLog SSI 0-20units  NovoLog injection 6 units Potassium Chloride SA 40 mEg    NUTRITION - FOCUSED PHYSICAL EXAM:    Most Recent Value  Orbital Region  Mild depletion  Upper Arm Region  No depletion  Thoracic and Lumbar Region  No depletion  Buccal Region  Mild depletion  Temple Region  Mild depletion  Clavicle Bone Region  No depletion  Clavicle and Acromion Bone Region  No depletion  Scapular Bone Region  No depletion  Dorsal Hand  Mild depletion [R Hand had some Swelling]  Patellar Region  No depletion  Anterior Thigh Region  No depletion  Posterior Calf Region  No depletion  Edema (RD Assessment)  None  Hair  Reviewed  Eyes  Reviewed  Mouth  Reviewed  Skin  Reviewed  Nails  Reviewed       Diet Order:   Diet Order            Diet heart healthy/carb modified Room service appropriate? Yes; Fluid consistency: Thin  Diet effective now              EDUCATION NEEDS:   Education needs have been addressed  Skin:  Skin Assessment: Reviewed RN Assessment  Last BM:  2/12  Height:   Ht Readings from Last 1 Encounters:  07/06/18 5\' 6"  (1.676 m)    Weight:   Wt Readings  from Last 1 Encounters:  07/06/18 86.1 kg    Ideal Body Weight:  59.1 kg  BMI:  Body mass index is 30.64 kg/m.  Estimated Nutritional Needs:   Kcal:  1800-2000  Protein:  85-105 grams  Fluid:  >1.7L    Herma Carson, [place] Dietetic Intern

## 2018-07-11 NOTE — Progress Notes (Signed)
*  PRELIMINARY RESULTS* Vascular Ultrasound Bilateral lower extremity venous duplex has been completed.  Please see CV Proc tab for preliminary results.  Everrett Coombe 07/11/2018, 9:35 AM

## 2018-07-11 NOTE — Progress Notes (Addendum)
NCM spoke with pt's pharmacy rep.@ [facility] [phone]) and was told Lovenox 130 mg daily x 30 day's pt cost @ 20% would be $ 114.87. Whitman Hero RN,BSN,CM

## 2018-07-11 NOTE — Progress Notes (Signed)
ANTICOAGULATION CONSULT NOTE -   Pharmacy Consult for Heparin bridge (holding Apixaban) Indication: History of DVT/PE  Patient Measurements: Height: 5\' 6"  (167.6 cm) Weight: 189 lb 13.1 oz (86.1 kg) IBW/kg (Calculated) : 59.3 Heparin Dosing Weight: 77.7 kg  Vital Signs: Temp: 98.2 F (36.8 C) (02/14 0353) Temp Source: Oral (02/14 0353) BP: 110/68 (02/14 0353) Pulse Rate: 88 (02/14 0353)  Labs: Recent Labs    07/08/18 1051 07/08/18 2051  07/09/18 0413 07/09/18 1028 07/09/18 1637 07/10/18 0426 07/10/18 2113 07/11/18 0347  HGB  --   --    < > 10.6*  --   --  11.1*  --  10.9*  HCT  --   --   --  33.0*  --   --  35.0*  --  32.8*  PLT  --   --   --  270  --   --  344  --  292  APTT 65* 67*  --   --  81*  --   --   --   --   LABPROT  --   --   --   --   --  15.5*  --   --   --   INR  --   --   --   --   --  1.25  --   --   --   HEPARINUNFRC  --   --    < > 0.47  --   --  <0.10* 0.25* 0.26*  CREATININE  --   --   --  0.89  --   --   --   --  0.79   < > = values in this interval not displayed.    Estimated Creatinine Clearance: 75.4 mL/min (by C-G formula based on SCr of 0.79 mg/dL).   Assessment: 25 YOF with recent hx of PE/DVT 03/2018 on Apixaban 5mg  bid prior to admission, last taken pta 07/04/18 at 10:00 AM.  Apixaban held PTA for a procedure and bridged with Lovenox PTA 1.5 mg/kg q24h  taken on 2/8 pta.  She has received Lovenox 1.5 mg/kg SQ q24h on 07/06/18 at ~22:00 last night. Lovenox changed to IV heparin infusion (no bolus) as patient needed ct guided lung bx by IR and port-a-cath placement on 07/11/18.   Heparin level remains subtherapeutic.   Goal of Therapy:  Heparin level 0.3-0.7 units/ml  Monitor platelets by anticoagulation protocol: Yes   Plan:  Increase heparin gtt to 1850 units/hr Check an 8 hr heparin level Daily heparin level and CBC  Salome Arnt, PharmD, BCPS Please see AMION for all pharmacy numbers 07/11/2018 5:56 AM

## 2018-07-11 NOTE — Plan of Care (Signed)
  Problem: Education: Goal: Knowledge of General Education information will improve Description Including pain rating scale, medication(s)/side effects and non-pharmacologic comfort measures Outcome: Progressing   Problem: Health Behavior/Discharge Planning: Goal: Ability to manage health-related needs will improve Outcome: Progressing   Problem: Clinical Measurements: Goal: Ability to maintain clinical measurements within normal limits will improve Outcome: Progressing Goal: Will remain free from infection Outcome: Progressing Goal: Diagnostic test results will improve Outcome: Progressing Goal: Respiratory complications will improve Outcome: Progressing Goal: Cardiovascular complication will be avoided Outcome: Progressing   Problem: Activity: Goal: Risk for activity intolerance will decrease Outcome: Progressing   Problem: Coping: Goal: Level of anxiety will decrease Outcome: Progressing   Problem: Safety: Goal: Ability to remain free from injury will improve Outcome: Progressing   Problem: Fluid Volume: Goal: Hemodynamic stability will improve Outcome: Progressing   Problem: Respiratory: Goal: Ability to maintain adequate ventilation will improve Outcome: Progressing   Problem: Activity: Goal: Ability to tolerate increased activity will improve Outcome: Progressing   Problem: Clinical Measurements: Goal: Ability to maintain a body temperature in the normal range will improve Outcome: Progressing   Problem: Respiratory: Goal: Ability to maintain adequate ventilation will improve Outcome: Progressing Goal: Ability to maintain a clear airway will improve Outcome: Progressing

## 2018-07-11 NOTE — Procedures (Signed)
Pre Procedure Dx: Presumed metastatic lung cancer Post Procedural Dx: Same  Technically successful US guided biopsy of infiltrative mass within the right side of the neck.  EBL: None No immediate complications.   Ronny Bacon, MD Pager #: [phone]

## 2018-07-11 NOTE — Progress Notes (Signed)
PT Cancellation Note  Patient Details Name: Kaitlin [name] MRN: [id number] DOB: [birth year]   Cancelled Treatment:    Reason Eval/Treat Not Completed: Active bedrest order Patient currently with active bed rest orders. Will follow up as activity orders updated and as schedule allows.  Erick Blinks, SPT  Erick Blinks 07/11/2018, 2:38 PM

## 2018-07-11 NOTE — Progress Notes (Signed)
ANTICOAGULATION CONSULT NOTE -   Pharmacy Consult for Heparin bridge (holding Apixaban) Indication: History of DVT/PE  Patient Measurements: Height: 5\' 6"  (167.6 cm) Weight: 189 lb 13.1 oz (86.1 kg) IBW/kg (Calculated) : 59.3 Heparin Dosing Weight: 77.7 kg  Vital Signs: BP: 99/69 (02/14 1344) Pulse Rate: 86 (02/14 1344)  Labs: Recent Labs    07/08/18 2051  07/09/18 0413 07/09/18 1028 07/09/18 1637 07/10/18 0426 07/10/18 2113 07/11/18 0347 07/11/18 1416  HGB  --    < > 10.6*  --   --  11.1*  --  10.9*  --   HCT  --   --  33.0*  --   --  35.0*  --  32.8*  --   PLT  --   --  270  --   --  344  --  292  --   APTT 67*  --   --  81*  --   --   --   --   --   LABPROT  --   --   --   --  15.5*  --   --   --   --   INR  --   --   --   --  1.25  --   --   --   --   HEPARINUNFRC  --    < > 0.47  --   --  <0.10* 0.25* 0.26* <0.10*  CREATININE  --   --  0.89  --   --   --   --  0.79  --    < > = values in this interval not displayed.    Estimated Creatinine Clearance: 75.4 mL/min (by C-G formula based on SCr of 0.79 mg/dL).   Assessment: 11 YOF with recent hx of PE/DVT 03/2018 on Apixaban 5mg  bid prior to admission, last taken pta 07/04/18 at 10:00 AM.  Apixaban held PTA for a procedure and bridged with Lovenox PTA 1.5 mg/kg q24h  taken on 2/8 pta.  She has received Lovenox 1.5 mg/kg SQ q24h on 07/06/18 at ~22:00 last night. Lovenox changed to IV heparin infusion (no bolus) as patient needed ct guided lung bx by IR and port-a-cath placement on 07/11/18.   Heparin was held this morning for bx and resumed around 1430. Plan is transition to lovenox tomorrow. HL came back undetectable for that reason. Re-check level this PM.    Goal of Therapy:  Heparin level 0.3-0.7 units/ml  Monitor platelets by anticoagulation protocol: Yes   Plan:  Resume heparin gtt 1850 units/hr Check an 6 hr heparin level Change to lovenox in AM  Onnie Boer, PharmD, BCIDP, AAHIVP, CPP Infectious Disease  Pharmacist 07/11/2018 3:56 PM

## 2018-07-11 NOTE — Care Management (Signed)
#  3.  S/W PAUL  @ [facility] # [phone]   1. LOVENOX  130 MG  DAILY COVER- YES CO-PAY- 20 % OF TOTAL COST TIER- NO PRIOR APPROVAL- NO  2. ENOXAPARIN 130 MG DAILY COVER- YES CO-PAY- 20 % OF TOTAL COST TIER- NO PRIOR APPROVAL- NO  NO DEDUCTIBLE  OUT-OF-POCKET- NOT MET  PREFERRED PHARMACY : YES [facility] [place] [place]

## 2018-07-12 DIAGNOSIS — C7931 Secondary malignant neoplasm of brain: Secondary | ICD-10-CM | POA: Insufficient documentation

## 2018-07-12 DIAGNOSIS — I2699 Other pulmonary embolism without acute cor pulmonale: Secondary | ICD-10-CM

## 2018-07-12 DIAGNOSIS — E119 Type 2 diabetes mellitus without complications: Secondary | ICD-10-CM

## 2018-07-12 DIAGNOSIS — Z7901 Long term (current) use of anticoagulants: Secondary | ICD-10-CM

## 2018-07-12 DIAGNOSIS — I311 Chronic constrictive pericarditis: Secondary | ICD-10-CM

## 2018-07-12 LAB — BASIC METABOLIC PANEL
Anion gap: 8 (ref 5–15)
BUN: 7 mg/dL — ABNORMAL LOW (ref 8–23)
CO2: 28 mmol/L (ref 22–32)
Calcium: 8.8 mg/dL — ABNORMAL LOW (ref 8.9–10.3)
Chloride: 96 mmol/L — ABNORMAL LOW (ref 98–111)
Creatinine, Ser: 0.71 mg/dL (ref 0.44–1.00)
GFR calc Af Amer: >60 mL/min (ref >60)
GFR calc non Af Amer: >60 mL/min (ref >60)
Glucose, Bld: 103 mg/dL — ABNORMAL HIGH (ref 70–99)
Potassium: 3.1 mmol/L — ABNORMAL LOW (ref 3.5–5.1)
Sodium: 132 mmol/L — ABNORMAL LOW (ref 135–145)

## 2018-07-12 LAB — HEPARIN LEVEL (UNFRACTIONATED): Heparin Unfractionated: 0.57 IU/mL (ref 0.30–0.70)

## 2018-07-12 LAB — CBC
HCT: 32.4 % — ABNORMAL LOW (ref 36.0–46.0)
HEMOGLOBIN: 10.7 g/dL — AB (ref 12.0–15.0)
MCH: 26.5 pg (ref 26.0–34.0)
MCHC: 33 g/dL (ref 30.0–36.0)
MCV: 80.2 fL (ref 80.0–100.0)
Platelets: 321 10*3/uL (ref 150–400)
RBC: 4.04 MIL/uL (ref 3.87–5.11)
RDW: 15.4 % (ref 11.5–15.5)
WBC: 8.5 10*3/uL (ref 4.0–10.5)
nRBC: 0 % (ref 0.0–0.2)

## 2018-07-12 LAB — GLUCOSE, CAPILLARY
Glucose-Capillary: 91 mg/dL (ref 70–99)
Glucose-Capillary: 79 mg/dL (ref 70–99)

## 2018-07-12 LAB — MAGNESIUM: MAGNESIUM: 1.9 mg/dL (ref 1.7–2.4)

## 2018-07-12 MED ORDER — ENOXAPARIN SODIUM 150 MG/ML ~~LOC~~ SOLN: 1.5000 mg/kg | SUBCUTANEOUS | 0 refills | Status: DC

## 2018-07-12 MED ORDER — AMOXICILLIN-POT CLAVULANATE 875-125 MG PO TABS: 1.0000 | ORAL_TABLET | Freq: Two times a day (BID) | ORAL | 0 refills | Status: DC

## 2018-07-12 MED ORDER — ENOXAPARIN SODIUM 150 MG/ML ~~LOC~~ SOLN
1.5000 mg/kg | SUBCUTANEOUS | Status: DC
  Administered 2018-07-12: 130 mg via SUBCUTANEOUS
  Filled 2018-07-12: qty 0.86

## 2018-07-12 NOTE — Discharge Instructions (Signed)
Follow with Primary MD [name], MD in 7 days   Get CBC, CMP, 2 view Chest X ray -  checked  by Primary MD  in 5-7 days    Activity: As tolerated with Full fall precautions use walker/cane & assistance as needed  Disposition Home    Diet: Heart Healthy Low Carb. CBGs QAC-HS   Special Instructions: If you have smoked or chewed Tobacco  in the last 2 yrs please stop smoking, stop any regular Alcohol  and or any Recreational drug use.  On your next visit with your primary care physician please Get Medicines reviewed and adjusted.  Please request your Prim.MD to go over all Hospital Tests and Procedure/Radiological results at the follow up, please get all Hospital records sent to your Prim MD by signing hospital release before you go home.  If you experience worsening of your admission symptoms, develop shortness of breath, life threatening emergency, suicidal or homicidal thoughts you must seek medical attention immediately by calling 911 or calling your MD immediately  if symptoms less severe.  You Must read complete instructions/literature along with all the possible adverse reactions/side effects for all the Medicines you take and that have been prescribed to you. Take any new Medicines after you have completely understood and accpet all the possible adverse reactions/side effects.   Do not drive, operate heavy machinery, perform activities at heights, swimming or participation in water activities or provide baby sitting services if your were admitted for syncope or siezures until you have seen by Primary MD or a Neurologist and advised to do so again.  Do not drive when taking Pain medications.  Do not take more than prescribed Pain, Sleep and Anxiety Medications  Wear Seat belts while driving.   Please note  You were cared for by a hospitalist during your hospital stay. If you have any questions about your discharge medications or the care you received while you were in the hospital  after you are discharged, you can call the unit and asked to speak with the hospitalist on call if the hospitalist that took care of you is not available. Once you are discharged, your primary care physician will handle any further medical issues. Please note that NO REFILLS for any discharge medications will be authorized once you are discharged, as it is imperative that you return to your primary care physician (or establish a relationship with a primary care physician if you do not have one) for your aftercare needs so that they can reassess your need for medications and monitor your lab values.

## 2018-07-12 NOTE — Progress Notes (Signed)
Marlene Lard to be D/C'd home per MD order. Discussed with the patient and all questions fully answered.   VVS, Skin clean, dry and intact without evidence of skin break down, no evidence of skin tears noted.  IV catheter discontinued intact. Site without signs and symptoms of complications. Dressing and pressure applied.  An After Visit Summary was printed and given to the patient.  Patient escorted via [place], and D/C home via private auto.  Tama High  07/12/2018 12:33 PM

## 2018-07-12 NOTE — Discharge Summary (Signed)
Kaitlin [name] ION:[id number] DOB: February 14, 1951 DOA: 07/06/2018  PCP: [name], MD  Admit date: 07/06/2018  Discharge date: 07/12/2018  Admitted From: Home   Disposition:  Home   Recommendations for Outpatient Follow-up:   Follow up with PCP in 1-2 weeks  PCP Please obtain BMP/CBC, 2 view CXR in 1week,  (see Discharge instructions)   PCP Please follow up on the following pending results:    Home Health: None   Equipment/Devices: None  Consultations: None Discharge Condition: Fair   CODE STATUS: Full   Diet Recommendation: Heart Healthy Low Carb    Chief Complaint  Patient presents with  . Shortness of Breath  . Tachycardia     Brief history of present illness from the day of admission and additional interim summary    Patient is a 68 y.o. female with with recent diagnosis of venous thromboembolism on Eliquis (November 2019) and adenocarcinoma of the lung-s/p CT-guided lung biopsy on 1/22-followed by oncology at [facility] (not yet started on treatment-with plans as outpatient for repeat biopsy for more mutation/genetic testing) presented to the hospital for sepsis secondary to postobstructive pneumonia.  Upon hospitalization-further evaluation revealed a large pericardial effusion without tamponade features, and upon repeat imaging of the chest by interventional radiology (before repeat lung biopsy) found to have new left pulmonary embolism (prior pulmonary embolism on the right side) and a new left popliteal vein DVT.  Patient was also found to have worsening lymphadenopathy, and underwent a ultrasound guided core biopsy of lymph node on the right side of the neck (instead of repeat lung biopsy) on 2/14.See below for further details.                                                                 Hospital Course     Sepsis secondary to left lobar pneumonia-postobstructive pneumonia: Slowly improved-sepsis pathophysiology has resolved, no further leukocytosis.  Blood cultures are negative.  Respiratory virus panel negative.  Stopped cefepime-transitioned to Augmentin-with stop date of 07/19/2018.  Now symptom-free and she is off oxygen.  Pericardial effusion: Seen on CT chest done on 2/10, confirmed on echocardiogram-although effusion has increased compared to most recent echocardiogram, no tamponade physiology seen.  Appreciate cardiology input by Dr. Jacinto Halim.  We will continue to monitor closely.  Suspect that this is likely malignant pericardial effusion in the setting of stage IV lung cancer.    History of right-sided pulmonary embolism and bilateral DVT November 2019: On Eliquis as outpatient-bridged to IV heparin-as plans were to pursue inpatient repeat lung biopsy and possible Port-A-Cath placement.  Underwent repeat CT chest on 2/13 before proceeding with lung biopsy-surprisingly found to have a new left lung pulmonary embolism (prior PE was right-sided) and a lower extremity Doppler showed a new thrombus in the left popliteal vein (not present in  November 2019).  Patient has been on IV heparin during this hospital stay-and has not missed any doses of Eliquis in the outpatient setting.  I discussed these new findings with patient's primary oncologist Dr. Ellin Saba over the phone, given possibility of anticoagulant failure with Eliquis, recommends that we now switch her over to subcutaneous Lovenox instead of Eliquis.  Had spoken with IR PA-C. Rose Phi earlier this morning-recommendations were to resume IV heparin post biopsy on 2/14-and then to consider transitioning to Lovenox starting on 2/14.    Management to assist with Lovenox procurement.  Per Dr. Ellin Saba, no need for IVC filter at this point.  Stage IV adenocarcinoma of the lung: Recently diagnosed with adenocarcinoma of the lung-did undergo a  lung biopsy on 1/22-but needs more tissue for genetic testing.  IR consulted during hospital stay-with plans for repeat lung biopsy and Port-A-Cath placement on 2/14-however upon further evaluation by a repeat CT chest on 2/13-was found to have neck adenopathy.  IR subsequently performed a right neck core lymph node biopsy on 2/14.  IR unable to place Port-A-Cath on 2/14-recommendations are to reconsult IR in the outpatient setting for Port-A-Cath placement.  Also was contacted by radiation oncology- PA-C Olivia Mackie will arrange for patient to follow-up with them to begin palliative radiation for early next week.  Hypokalemia: Continue to replete-hold chlorthalidone for now.  Recheck electrolytes tomorrow  Hypertension: Blood pressure controlled-continue chlorthalidone, losartan   Discharge diagnosis     Principal Problem:   Sepsis (HCC) Active Problems:   Hypertension   Diabetes mellitus without complication (HCC)   Pulmonary embolism during treatment with long-term anticoagulation therapy (HCC)   HCAP (healthcare-associated pneumonia)   Adenocarcinoma of lung, right (HCC)   Malignant pericardial effusion [facility])    Discharge instructions    Discharge Instructions    Discharge instructions   Complete by:  As directed    Follow with Primary MD [name], MD in 7 days   Get CBC, CMP, 2 view Chest X ray -  checked  by Primary MD  in 5-7 days    Activity: As tolerated with Full fall precautions use walker/cane & assistance as needed  Disposition Home    Diet: Heart Healthy Low Carb. CBGs QAC-HS   Special Instructions: If you have smoked or chewed Tobacco  in the last 2 yrs please stop smoking, stop any regular Alcohol  and or any Recreational drug use.  On your next visit with your primary care physician please Get Medicines reviewed and adjusted.  Please request your Prim.MD to go over all Hospital Tests and Procedure/Radiological results at the follow up, please get  all Hospital records sent to your Prim MD by signing hospital release before you go home.  If you experience worsening of your admission symptoms, develop shortness of breath, life threatening emergency, suicidal or homicidal thoughts you must seek medical attention immediately by calling 911 or calling your MD immediately  if symptoms less severe.  You Must read complete instructions/literature along with all the possible adverse reactions/side effects for all the Medicines you take and that have been prescribed to you. Take any new Medicines after you have completely understood and accpet all the possible adverse reactions/side effects.   Do not drive, operate heavy machinery, perform activities at heights, swimming or participation in water activities or provide baby sitting services if your were admitted for syncope or siezures until you have seen by Primary MD or a Neurologist and advised to do so again.  Do  not drive when taking Pain medications.  Do not take more than prescribed Pain, Sleep and Anxiety Medications  Wear Seat belts while driving.   Please note  You were cared for by a hospitalist during your hospital stay. If you have any questions about your discharge medications or the care you received while you were in the hospital after you are discharged, you can call the unit and asked to speak with the hospitalist on call if the hospitalist that took care of you is not available. Once you are discharged, your primary care physician will handle any further medical issues. Please note that NO REFILLS for any discharge medications will be authorized once you are discharged, as it is imperative that you return to your primary care physician (or establish a relationship with a primary care physician if you do not have one) for your aftercare needs so that they can reassess your need for medications and monitor your lab values.   Increase activity slowly   Complete by:  As directed        Discharge Medications   Allergies as of 07/12/2018      Reactions   Banana Anaphylaxis   Aspirin Other (See Comments)   G.I. Upset       Medication List    STOP taking these medications   apixaban 5 MG Tabs tablet Commonly known as:  ELIQUIS     TAKE these medications   albuterol (2.5 MG/3ML) 0.083% nebulizer solution Commonly known as:  PROVENTIL Take 3 mLs (2.5 mg total) by nebulization every 6 (six) hours as needed for wheezing or shortness of breath.   albuterol 108 (90 Base) MCG/ACT inhaler Commonly known as:  PROVENTIL HFA;VENTOLIN HFA Inhale 2 puffs into the lungs every 6 (six) hours as needed for wheezing.   amLODipine 5 MG tablet Commonly known as:  NORVASC Take 5 mg by mouth daily.   amoxicillin-clavulanate 875-125 MG tablet Commonly known as:  AUGMENTIN Take 1 tablet by mouth every 12 (twelve) hours.   chlorthalidone 25 MG tablet Commonly known as:  HYGROTON Take 25 mg by mouth daily.   enoxaparin 150 MG/ML injection Commonly known as:  LOVENOX Inject 0.85 mLs (130 mg total) into the skin daily. Dispense 1 month supply for 130 mg subcu daily as needed. What changed:  additional instructions   HYDROcodone-homatropine 5-1.5 MG/5ML syrup Commonly known as:  HYCODAN Take 5 mLs by mouth every 4 (four) hours as needed for cough.   LORazepam 0.5 MG tablet Commonly known as:  ATIVAN Take 0.5 mg by mouth at bedtime as needed for anxiety.   losartan 100 MG tablet Commonly known as:  COZAAR Take 1 tablet (100 mg total) by mouth daily.   pantoprazole 40 MG tablet Commonly known as:  PROTONIX Take 1 tablet (40 mg total) by mouth daily.   predniSONE 10 MG tablet Commonly known as:  DELTASONE Take 10 mg by mouth daily with breakfast.   prochlorperazine 10 MG tablet Commonly known as:  COMPAZINE Take 1 tablet (10 mg total) by mouth every 6 (six) hours as needed for nausea or vomiting.   TRUE METRIX METER Devi 1 Device by Does not apply route daily.        Follow-up Information    [name], MD. Schedule an appointment as soon as possible for a visit in 1 week(s).   Specialty:  Internal Medicine Why:  and with your Oncologist in 1 week Contact information: [address] [place] [address] [phone]  Major procedures and Radiology Reports - PLEASE review detailed and final reports thoroughly  -        Dg Chest 2 View  Result Date: 07/06/2018 CLINICAL DATA:  Worsening shortness of breath since last night. Lung cancer. EXAM: CHEST - 2 VIEW COMPARISON:  06/18/2018 PET-CT from 05/14/2018 FINDINGS: Continued right lower lobe airspace opacity. New left lower lobe airspace opacity. Patient has a known posterior mass on the left side. Low lung volumes are present, causing crowding of the pulmonary vasculature. Moderate enlargement of the cardiopericardial silhouette, at least some of which has been shown to be due to pericardial effusion on prior exams. Thoracic spondylosis.  Mild thickening along the minor fissure. IMPRESSION: 1. New airspace opacity in the left lower lobe. Although potentially from pneumonia, if the patient is receiving radiation therapy to the known left lower lobe mass then radiation pneumonitis would be a differential diagnostic consideration. 2. Stable airspace opacity in the right lower lobe. 3. Stable enlargement of the cardiopericardial silhouette, previous exams showed a component of pericardial effusion. Electronically Signed   By: Gaylyn Rong M.D.   On: 07/06/2018 13:50   Ct Chest W Contrast  Result Date: 07/10/2018 CLINICAL DATA:  68 year old with biopsy-proven adenocarcinoma in the left lower lobe. Patient has been recently admitted for sepsis and pneumonia. Patient also history of pulmonary embolism and on anticoagulation. Plan for follow-up chest CT to see if the patient would be a candidate for repeat left lung biopsy because additional tissue is needed for molecular testing.  EXAM: CT CHEST WITH CONTRAST TECHNIQUE: Multidetector CT imaging of the chest was performed during intravenous contrast administration. CONTRAST:  75mL OMNIPAQUE IOHEXOL 300 MG/ML  SOLN COMPARISON:  CT 07/07/2018 FINDINGS: Cardiovascular: Again noted is a large pericardial effusion which is similar in size to the study on 07/07/2018. Patient has bilateral pulmonary emboli. There is emboli in the distal right pulmonary artery extending into the right upper lobe and right lower lobe branches. There is also emboli in the left lower lobe and lingular branches. The left pulmonary emboli are new since 04/24/2018. Normal caliber of the thoracic aorta. The great vessels are patent. Mediastinum/Nodes: There appears to be marked thickening or enlargement of the esophagus and this is similar to the prior CTs. Again noted is diffuse soft tissue or edema in the mediastinum. Concern for bilateral hilar soft tissue enlargement or lymphadenopathy. There is also increased edema and possible lymphadenopathy in the supraclavicular regions, right side greater than left. Lungs/Pleura: Left pleural effusion has slightly enlarged from the recent comparison examination on 07/07/2018. The lesion in the posterior left lower lobe is poorly characterized due to the adjacent consolidation in left lower lobe. Aeration in the left lower lobe has minimally changed from the recent comparison examination. Persistent sub solid densities along the right lung apex have minimally changed. Again noted is indeterminate nodularity along the right major fissure on sequence 5, image 53 measuring 1.1 x 0.6 cm. Patchy peripheral densities in the right lower lobe have minimally changed from the recent comparison examination. Again noted is a small irregular right pleural effusion. Upper Abdomen: Again noted is thickening and mild stranding around the bilateral adrenal glands. Images of the upper liver are unremarkable. Musculoskeletal: No acute bone  abnormality. IMPRESSION: 1. Bilateral pulmonary emboli. Pulmonary emboli in the left lung is new since 04/24/2018 and difficult to exclude acute thrombus in the right lung. 2. Persistent densities and consolidation in the left lower lung. Findings are compatible with combination of the known  left lung lesion, compressive atelectasis and possibly pneumonia. Left pleural effusion has slightly enlarged in size. 3. Persistent pleuroparenchymal disease in the right lower lobe that is minimally changed since 07/07/2017 and probably associated with previous large pulmonary embolism in this area. 4. Large pericardial effusion have not significantly changed since the recent comparison examination. 5. Extensive soft tissue throughout the mediastinum and bilateral hilar regions is concerning for metastatic disease based on the previous PET-CT. There also appears to be diffuse thickening of the esophagus. In addition, there is abnormal soft tissue in the supraclavicular regions and cannot exclude metastatic lymphadenopathy. Recommend further characterization of the supraclavicular lymphadenopathy with ultrasound to see if this would be an adequate site for additional tissue sampling. 6. Persistent thickening of the adrenal glands which is also concerning for metastatic disease. These results were called by telephone at the time of interpretation on 07/10/2018 at 4:45 pm to Dr. Jerral Ralph, who verbally acknowledged these results. Electronically Signed   By: Richarda Overlie M.D.   On: 07/10/2018 16:50   Ct Chest W Contrast  Result Date: 07/07/2018 CLINICAL DATA:  Chest pain.  Short of breath. EXAM: CT CHEST WITH CONTRAST TECHNIQUE: Multidetector CT imaging of the chest was performed during intravenous contrast administration. CONTRAST:  OMNIPAQUE IOHEXOL 300 MG/ML  SOLN COMPARISON:  Chest radiograph, 07/06/2018.  Chest CT, 04/24/2018. FINDINGS: Cardiovascular: Heart is normal in size and configuration. There is a moderate to  large pericardial effusion with Hounsfield units averaging between 14 and 27. Great vessels are normal in caliber. No aortic dissection or atherosclerosis. Arch branch vessels are widely patent. Mediastinum/Nodes: No neck base or axillary masses. No axillary adenopathy. There is increased attenuation of the fat throughout the mediastinum with pericardial fluid extending into the superior pericardial recesses. Esophagus is distended with evidence of wall thickening. Esophagus is not well-defined. Previously noted mediastinal and hilar adenopathy is less well-defined but grossly unchanged. Trachea is unremarkable. Lungs/Pleura: Right lower lobe airspace consolidation noted on the prior CT has mostly resolved. There are small bilateral pleural effusions, larger on the left. There is dependent opacity in the lower lobes, left upper lobe lingula and right middle lobe, which may all be due to atelectasis. Atelectasis and a component of infection is possible. The posterior left lower lobe mass is not as well-defined now with contiguous lower lobe opacity. No new lung masses. No discrete nodules. Bronchi to the lower lobes, right middle lobe and left upper lobe lingula are narrowed, somewhat greater than on the prior CT. No pneumothorax. Upper Abdomen: Adrenal gland thickening or masses, similar to the prior CT. No acute findings in the visualized upper abdomen. Musculoskeletal: No fracture or acute finding. No osteoblastic or osteolytic lesions. IMPRESSION: 1. Moderate to large pericardial effusion, increased in size from the prior CT. 2. Mildly dilated, prominent esophagus with evidence of diffuse wall thickening. This is nonspecific. 3. Previously seen right lower lobe consolidation has essentially resolved. Left lower lobe mass is most likely unchanged. It is less well-defined due to contiguous left lower lobe opacity, the latter finding which may reflect atelectasis, infection or a combination. Additional lung base  opacities noted bilaterally that are most likely atelectasis. There is narrowing of the right middle lobe, bilateral lower lobe and left upper lobe lingula bronchi as a pass through infrahilar abnormal soft tissue, presumed to be adenopathy. This bronchial narrowing has increased when compared to the prior study. 4. Small bilateral pleural effusions, larger on the left. Right pleural effusion is smaller than on the  prior study and left pleural effusion is new. Electronically Signed   By: Amie Portland M.D.   On: 07/07/2018 15:49   US Soft Tissue Head & Neck (non-thyroid)  Result Date: 07/11/2018 CLINICAL DATA:  68 year old female with adenocarcinoma in the left lower lobe. Recent chest CT demonstrates abnormal soft tissue in the supraclavicular regions. Evaluate for supraclavicular lymphadenopathy and site for additional tissue sampling. EXAM: ULTRASOUND OF HEAD/NECK SOFT TISSUES TECHNIQUE: Ultrasound examination of the head and neck soft tissues was performed in the area of clinical concern. COMPARISON:  Chest CT 07/10/2018 FINDINGS: Abnormal heterogeneous soft tissue on both sides of the neck. Tissue on the right side of the neck appears to be edematous but there is concern for underlying abnormal soft tissue or lymphadenopathy lateral to the right internal jugular vein. These findings correspond with the recent CT findings. On the left side, there is more distinct abnormal soft tissue that is concerning for metastatic disease. Irregular hypoechoic lymph node or lesion just lateral to the left internal jugular vein measuring 1.1 cm in the short axis. There is an irregular hypoechoic collection in the lateral left neck which could represent fluid or necrotic lymph node measuring 1.6 x 1.0 x 1.1 cm. There is another cystic hypoechoic structure along the lateral left neck measuring 1.2 x 0.8 x 1.4 cm. IMPRESSION: Irregular heterogeneous soft tissue on both sides of the lower neck. Findings are most compatible  with metastatic lymphadenopathy based on the recent chest CT. There are small cystic or necrotic lymph nodes on the left side of the neck as described. Electronically Signed   By: Richarda Overlie M.D.   On: 07/11/2018 07:50   Ct Biopsy  Result Date: 06/18/2018 INDICATION: Left lower lobe hypermetabolic lung mass EXAM: CT BIOPSY MEDICATIONS: None. ANESTHESIA/SEDATION: Fentanyl 100 mcg IV; Versed 2 mg IV Moderate Sedation Time:  13 minutes The patient was continuously monitored during the procedure by the interventional radiology nurse under my direct supervision. FLUOROSCOPY TIME:  None COMPLICATIONS: None immediate. PROCEDURE: Informed written consent was obtained from the patient after a thorough discussion of the procedural risks, benefits and alternatives. All questions were addressed. Maximal Sterile Barrier Technique was utilized including caps, mask, sterile gowns, sterile gloves, sterile drape, hand hygiene and skin antiseptic. A timeout was performed prior to the initiation of the procedure. Under CT guidance, a(n) 17 gauge guide needle was advanced into the left lower lobe lung mass. Subsequently, 3 18 gauge core biopsies were obtained. A collagen plug was deployed in the needle tract. The guide needle was removed. Post biopsy images demonstrate no pneumothorax. Patient tolerated the procedure well without complication. Vital sign monitoring by nursing staff during the procedure will continue as patient is in the special procedures unit for post procedure observation. FINDINGS: The images document guide needle placement within the left lower lobe lung mass. Post biopsy images demonstrate no pneumothorax. IMPRESSION: Successful CT-guided core biopsy of a left lower lobe lung mass. Electronically Signed   By: Jolaine Click M.D.   On: 06/18/2018 12:17   Dg Chest Port 1 View  Result Date: 06/18/2018 CLINICAL DATA:  Status post left lung biopsy EXAM: PORTABLE CHEST 1 VIEW COMPARISON:  CT from earlier in the  same day. FINDINGS: Cardiac shadow is enlarged but stable. Left lung base is again identified retrocardiac position. No underlying pneumothorax is seen. Persistent density in the right base stable from the prior CT is noted. No bony abnormality is noted. IMPRESSION: No pneumothorax following lung biopsy on the  left. Electronically Signed   By: Alcide Clever M.D.   On: 06/18/2018 13:25   [place] Core Biopsy (lymph Nodes)  Result Date: 07/11/2018 INDICATION: History of biopsy proven lung adenocarcinoma. Request made for ultrasound-guided biopsy of infiltrative mass within the base of the right-side of the neck for acquisition of additional tissue for molecular testing. EXAM: ULTRASOUND-GUIDED RIGHT CERVICAL MASS BIOPSY COMPARISON:  Chest CT - 07/10/2018; soft tissue neck ultrasound - 07/10/2018 MEDICATIONS: None ANESTHESIA/SEDATION: Moderate (conscious) sedation was employed during this procedure. A total of Versed 2 mg and Fentanyl 100 mcg was administered intravenously. Moderate Sedation Time: 11 minutes. The patient's level of consciousness and vital signs were monitored continuously by radiology nursing throughout the procedure under my direct supervision. COMPLICATIONS: None immediate. TECHNIQUE: Informed written consent was obtained from the patient after a discussion of the risks, benefits and alternatives to treatment. Questions regarding the procedure were encouraged and answered. Initial ultrasound scanning demonstrated an ill-defined infiltrative mass involving the base of the right-side of the neck just superior to the superior to the supraclavicular fossa, unchanged compared to preceding soft tissue neck ultrasound performed 07/10/2018. an ultrasound image was saved for documentation purposes. The procedure was planned. A timeout was performed prior to the initiation of the procedure. The operative was prepped and draped in the usual sterile fashion, and a sterile drape was applied covering the operative  field. A timeout was performed prior to the initiation of the procedure. Local anesthesia was provided with 1% lidocaine with epinephrine. Under direct ultrasound guidance, an 18 gauge core needle device was utilized to obtain to obtain 5 core needle biopsies of the infiltrative mass within the base of the right-side of the neck. The samples were placed in saline and submitted to pathology. The needle was removed and hemostasis was achieved with manual compression. Post procedure scan was negative for significant hematoma. A dressing was placed. The patient tolerated the procedure well without immediate postprocedural complication. IMPRESSION: Technically successful ultrasound guided biopsy of infiltrative mass within the base of the right-side of the neck. Electronically Signed   By: Simonne Come M.D.   On: 07/11/2018 14:45   Vas [place] Lower Extremity Venous (dvt)  Result Date: 07/11/2018  Lower Venous Study Indications: Pulmonary embolism.  Performing Technologist: Levada Schilling RDMS, RVT  Examination Guidelines: A complete evaluation includes B-mode imaging, spectral Doppler, color Doppler, and power Doppler as needed of all accessible portions of each vessel. Bilateral testing is considered an integral part of a complete examination. Limited examinations for reoccurring indications may be performed as noted.  Right Venous Findings: +---------+---------------+---------+-----------+----------+-------+          CompressibilityPhasicitySpontaneityPropertiesSummary +---------+---------------+---------+-----------+----------+-------+ CFV      Full           Yes      Yes                          +---------+---------------+---------+-----------+----------+-------+ SFJ      Full                                                 +---------+---------------+---------+-----------+----------+-------+ FV Prox  Full                                                  +---------+---------------+---------+-----------+----------+-------+  FV Mid   Full                                                 +---------+---------------+---------+-----------+----------+-------+ FV DistalFull                                                 +---------+---------------+---------+-----------+----------+-------+ PFV      Full                                                 +---------+---------------+---------+-----------+----------+-------+ POP      Full           Yes      Yes                          +---------+---------------+---------+-----------+----------+-------+ PTV      Full                                                 +---------+---------------+---------+-----------+----------+-------+ PERO     Full                                                 +---------+---------------+---------+-----------+----------+-------+ GSV      Full                                                 +---------+---------------+---------+-----------+----------+-------+  Left Venous Findings: +---------+---------------+---------+-----------+----------+------------------+          CompressibilityPhasicitySpontaneityPropertiesSummary            +---------+---------------+---------+-----------+----------+------------------+ CFV      Full           Yes      Yes                                     +---------+---------------+---------+-----------+----------+------------------+ SFJ      Full                                                            +---------+---------------+---------+-----------+----------+------------------+ FV Prox  Full                                                            +---------+---------------+---------+-----------+----------+------------------+ FV Mid  Full                                                            +---------+---------------+---------+-----------+----------+------------------+ FV  DistalFull                                                            +---------+---------------+---------+-----------+----------+------------------+ PFV      Full                                                            +---------+---------------+---------+-----------+----------+------------------+ POP      Full           Yes      Yes                  slow flow, no                                                            thrombus           +---------+---------------+---------+-----------+----------+------------------+ PTV      Full                                                            +---------+---------------+---------+-----------+----------+------------------+ PERO     Full                                                            +---------+---------------+---------+-----------+----------+------------------+ GSV      Full                                                            +---------+---------------+---------+-----------+----------+------------------+    Summary: Right: There is no evidence of deep vein thrombosis in the lower extremity. No cystic structure found in the popliteal fossa. Left: There is no evidence of deep vein thrombosis in the lower extremity. No cystic structure found in the popliteal fossa.  *See table(s) above for measurements and observations.    Preliminary     Micro Results     Recent Results (from the past 240 hour(s))  Blood Culture (routine x 2)     Status: None   Collection Time: 07/06/18 12:50 PM  Result Value Ref Range Status   Specimen Description BLOOD RIGHT ANTECUBITAL  Final   Special Requests   Final    BOTTLES DRAWN AEROBIC AND ANAEROBIC Blood Culture adequate volume   Culture   Final    NO GROWTH 5 DAYS Performed at [facility], [address], [place], [place] [address]    Report Status 07/11/2018 FINAL  Final  Blood Culture (routine x 2)     Status: None   Collection Time: 07/06/18  12:58 PM  Result Value Ref Range Status   Specimen Description BLOOD RIGHT HAND  Final   Special Requests   Final    BOTTLES DRAWN AEROBIC AND ANAEROBIC Blood Culture adequate volume   Culture   Final    NO GROWTH 5 DAYS Performed at [facility], [address] [place], [place] [address]    Report Status 07/11/2018 FINAL  Final  Respiratory Panel by PCR     Status: None   Collection Time: 07/07/18 10:20 AM  Result Value Ref Range Status   Adenovirus NOT DETECTED NOT DETECTED Final   Coronavirus 229E NOT DETECTED NOT DETECTED Final    Comment: (NOTE) The Coronavirus on the Respiratory Panel, DOES NOT test for the novel  Coronavirus (2019 nCoV)    Coronavirus HKU1 NOT DETECTED NOT DETECTED Final   Coronavirus NL63 NOT DETECTED NOT DETECTED Final   Coronavirus OC43 NOT DETECTED NOT DETECTED Final   Metapneumovirus NOT DETECTED NOT DETECTED Final   Rhinovirus / Enterovirus NOT DETECTED NOT DETECTED Final   Influenza A NOT DETECTED NOT DETECTED Final   Influenza B NOT DETECTED NOT DETECTED Final   Parainfluenza Virus 1 NOT DETECTED NOT DETECTED Final   Parainfluenza Virus 2 NOT DETECTED NOT DETECTED Final   Parainfluenza Virus 3 NOT DETECTED NOT DETECTED Final   Parainfluenza Virus 4 NOT DETECTED NOT DETECTED Final   Respiratory Syncytial Virus NOT DETECTED NOT DETECTED Final   Bordetella pertussis NOT DETECTED NOT DETECTED Final   Chlamydophila pneumoniae NOT DETECTED NOT DETECTED Final   Mycoplasma pneumoniae NOT DETECTED NOT DETECTED Final    Comment: Performed at [facility], [address] [place], [place] [address]    Today   Subjective    Allina Rothfeld today has no headache,no chest abdominal pain,no new weakness tingling or numbness, feels much better wants to go home today.     Objective   Blood pressure 108/73, pulse 86, temperature 98.1 F (36.7 C), temperature source Oral, resp. rate (!) 24, height 5\' 6"  (1.676 m), weight 86.1 kg, SpO2 100  %.   Intake/Output Summary (Last 24 hours) at 07/12/2018 1054 Last data filed at 07/12/2018 0700 Gross per 24 hour  Intake 642 ml  Output 800 ml  Net -158 ml    Exam Awake Alert, Oriented x 3, No new F.N deficits, Normal affect [place].AT,PERRAL Supple Neck,No JVD, No cervical lymphadenopathy appriciated.  Symmetrical Chest wall movement, Good air movement bilaterally, CTAB RRR,No Gallops,Rubs or new Murmurs, No Parasternal Heave +ve B.Sounds, Abd Soft, Non tender, No organomegaly appriciated, No rebound -guarding or rigidity. No Cyanosis, Clubbing or edema, No new Rash or bruise   Data Review   CBC w Diff:  Lab Results  Component Value Date   WBC 8.5 07/12/2018   HGB 10.7 (L) 07/12/2018   HCT 32.4 (L) 07/12/2018   PLT 321 07/12/2018   LYMPHOPCT 6 07/06/2018   MONOPCT 12 07/06/2018   EOSPCT 0 07/06/2018   BASOPCT 0 07/06/2018  CMP:  Lab Results  Component Value Date   NA 132 (L) 07/12/2018   K 3.1 (L) 07/12/2018   CL 96 (L) 07/12/2018   CO2 28 07/12/2018   BUN 7 (L) 07/12/2018   CREATININE 0.71 07/12/2018   PROT 7.6 07/06/2018   ALBUMIN 3.6 07/06/2018   BILITOT 0.9 07/06/2018   ALKPHOS 85 07/06/2018   AST 18 07/06/2018   ALT 15 07/06/2018  .   Total Time in preparing paper work, data evaluation and todays exam - 35 minutes  Susa Raring M.D on 07/12/2018 at 10:54 AM  [facility]   Office  [phone]

## 2018-07-12 NOTE — Progress Notes (Signed)
SATURATION QUALIFICATIONS: Patient Saturations on Room Air at Rest = 99%  Patient Saturations on Room Air while Ambulating = 98%

## 2018-07-12 NOTE — Progress Notes (Signed)
[place] for Heparin transition to Lovenox (holding Apixaban) Indication: History of DVT/PE w/ new PE this admission  Patient Measurements: Height: 5\' 6"  (167.6 cm) Weight: 189 lb 13.1 oz (86.1 kg) IBW/kg (Calculated) : 59.3  Vital Signs: Temp: 98.1 F (36.7 C) (02/15 0606) Temp Source: Oral (02/15 0606) BP: 108/73 (02/15 0606) Pulse Rate: 86 (02/15 0606)  Labs: Recent Labs    07/09/18 1028 07/09/18 1637  07/10/18 0426  07/11/18 0347 07/11/18 1416 07/11/18 2058 07/12/18 0531  HGB  --   --    < > 11.1*  --  10.9*  --   --  10.7*  HCT  --   --   --  35.0*  --  32.8*  --   --  32.4*  PLT  --   --   --  344  --  292  --   --  321  APTT 81*  --   --   --   --   --   --   --   --   LABPROT  --  15.5*  --   --   --   --   --   --   --   INR  --  1.25  --   --   --   --   --   --   --   HEPARINUNFRC  --   --   --  <0.10*   < > 0.26* <0.10* 0.49 0.57  CREATININE  --   --   --   --   --  0.79  --   --  0.71   < > = values in this interval not displayed.    Estimated Creatinine Clearance: 75.4 mL/min (by C-G formula based on SCr of 0.71 mg/dL).   Assessment: 64 YOF with recent hx of PE/DVT 03/2018 on Apixaban 5mg  bid PTA, held on admission for procedure. Pt had been on heparin gtt as bridge for biopsy. Pt found to have new PE on chest CT. Pharmacy has been consulted to transition patient from heparin to lovenox.  CBC stable, no bleeding noted.   Goal of Therapy:  Therapeutic Anticoagulation   Plan:  Stop heparin drip Start lovenox 1.5 mg/kg subQ q24h, one hour after stopping heparin drip Monitor daily CBC and s/sx of bleeding  Jackson Latino, PharmD PGY1 Pharmacy Resident Phone [phone] 07/12/2018     9:41 AM

## 2018-07-14 ENCOUNTER — Ambulatory Visit (HOSPITAL_COMMUNITY): Admission: RE | Admit: 2018-07-14 | Payer: Medicare HMO | Source: Home / Self Care | Admitting: General Surgery

## 2018-07-14 ENCOUNTER — Encounter (HOSPITAL_COMMUNITY): Admission: RE | Payer: Self-pay | Source: Home / Self Care

## 2018-07-14 ENCOUNTER — Ambulatory Visit
Admission: RE | Admit: 2018-07-14 | Discharge: 2018-07-14 | Disposition: A | Discharge status: Home / Self Care | Payer: Medicare HMO | Source: Ambulatory Visit | Attending: Radiation Oncology | Admitting: Radiation Oncology

## 2018-07-14 DIAGNOSIS — Z510 Encounter for antineoplastic radiation therapy: Secondary | ICD-10-CM | POA: Insufficient documentation

## 2018-07-14 DIAGNOSIS — C7931 Secondary malignant neoplasm of brain: Secondary | ICD-10-CM | POA: Diagnosis not present

## 2018-07-14 DIAGNOSIS — C3432 Malignant neoplasm of lower lobe, left bronchus or lung: Secondary | ICD-10-CM | POA: Diagnosis not present

## 2018-07-14 DIAGNOSIS — C3492 Malignant neoplasm of unspecified part of left bronchus or lung: Secondary | ICD-10-CM | POA: Insufficient documentation

## 2018-07-14 SURGERY — INSERTION, TUNNELED CENTRAL VENOUS DEVICE, WITH PORT
Anesthesia: Monitor Anesthesia Care | Laterality: Right

## 2018-07-15 ENCOUNTER — Inpatient Hospital Stay (HOSPITAL_COMMUNITY): Payer: Medicare HMO | Admitting: Dietician

## 2018-07-15 ENCOUNTER — Inpatient Hospital Stay (HOSPITAL_COMMUNITY): Payer: Medicare HMO | Attending: Hematology | Admitting: Hematology

## 2018-07-15 ENCOUNTER — Encounter (HOSPITAL_COMMUNITY): Payer: Self-pay | Admitting: Hematology

## 2018-07-15 ENCOUNTER — Telehealth (HOSPITAL_COMMUNITY): Payer: Self-pay | Admitting: Pharmacist

## 2018-07-15 ENCOUNTER — Other Ambulatory Visit: Payer: Self-pay

## 2018-07-15 ENCOUNTER — Telehealth (HOSPITAL_COMMUNITY): Payer: Self-pay | Admitting: Pharmacy Technician

## 2018-07-15 VITALS — BP 102/68 | HR 98 | Temp 98.4°F | Resp 16 | Wt 185.2 lb

## 2018-07-15 DIAGNOSIS — Z86718 Personal history of other venous thrombosis and embolism: Secondary | ICD-10-CM | POA: Diagnosis not present

## 2018-07-15 DIAGNOSIS — I2699 Other pulmonary embolism without acute cor pulmonale: Secondary | ICD-10-CM | POA: Diagnosis not present

## 2018-07-15 DIAGNOSIS — C3491 Malignant neoplasm of unspecified part of right bronchus or lung: Secondary | ICD-10-CM

## 2018-07-15 DIAGNOSIS — C770 Secondary and unspecified malignant neoplasm of lymph nodes of head, face and neck: Secondary | ICD-10-CM | POA: Insufficient documentation

## 2018-07-15 DIAGNOSIS — E119 Type 2 diabetes mellitus without complications: Secondary | ICD-10-CM | POA: Insufficient documentation

## 2018-07-15 DIAGNOSIS — C7931 Secondary malignant neoplasm of brain: Secondary | ICD-10-CM

## 2018-07-15 DIAGNOSIS — C3432 Malignant neoplasm of lower lobe, left bronchus or lung: Secondary | ICD-10-CM | POA: Diagnosis not present

## 2018-07-15 DIAGNOSIS — C3492 Malignant neoplasm of unspecified part of left bronchus or lung: Secondary | ICD-10-CM

## 2018-07-15 DIAGNOSIS — Z7901 Long term (current) use of anticoagulants: Secondary | ICD-10-CM | POA: Diagnosis not present

## 2018-07-15 DIAGNOSIS — I10 Essential (primary) hypertension: Secondary | ICD-10-CM | POA: Insufficient documentation

## 2018-07-15 DIAGNOSIS — Z79899 Other long term (current) drug therapy: Secondary | ICD-10-CM | POA: Insufficient documentation

## 2018-07-15 DIAGNOSIS — Z794 Long term (current) use of insulin: Secondary | ICD-10-CM | POA: Diagnosis not present

## 2018-07-15 DIAGNOSIS — Z510 Encounter for antineoplastic radiation therapy: Secondary | ICD-10-CM | POA: Diagnosis not present

## 2018-07-15 MED ORDER — OSIMERTINIB MESYLATE 80 MG PO TABS: 80.0000 mg | ORAL_TABLET | Freq: Every day | ORAL | 1 refills | Status: DC

## 2018-07-15 NOTE — Progress Notes (Signed)
[place] [place], [place] [address]   CLINIC:  Medical Oncology/Hematology  PCP:  [name], MD [address] [place] [address] [phone]   REASON FOR VISIT: Follow-up for adenocarcinoma of the lung  CURRENT THERAPY: Tegrisso   INTERVAL HISTORY:  Ms. Southers 68 y.o. female returns for routine follow-up for adenocarcinoma of the lung. She is here today with her family. She is healing from her biopsy and port placement. She is ready to begin treatment. Denies any nausea, vomiting, or diarrhea. Denies any new pains. Had not noticed any recent bleeding such as epistaxis, hematuria or hematochezia. Denies recent chest pain on exertion, shortness of breath on minimal exertion, pre-syncopal episodes, or palpitations. Denies any numbness or tingling in hands or feet. Denies any recent fevers, infections, or recent hospitalizations. Patient reports appetite at 100% and energy level at 75%. She is eating well and maintaining her weight at this time.    REVIEW OF SYSTEMS:  Review of Systems  All other systems reviewed and are negative.    PAST MEDICAL/SURGICAL HISTORY:  Past Medical History:  Diagnosis Date  . Asthma   . Carpal tunnel syndrome   . Cataract    left eye  . Diabetes mellitus without complication ([place])   . DVT (deep venous thrombosis) ([place])   . Hypertension   . Pulmonary embolism ([place]) 03/2018   Past Surgical History:  Procedure Laterality Date  . ABDOMINAL HYSTERECTOMY    . biopsy of right breast    . FOOT SURGERY    . implant left eye    . INTRAOCULAR LENS INSERTION       SOCIAL HISTORY:  Social History   Socioeconomic History  . Marital status: Widowed    Spouse name: Not on file  . Number of children: 4  . Years of education: 27  . Highest education level: High school graduate  Occupational History  . Not on file  Social Needs  . Financial resource strain: Very hard  . Food insecurity:    Worry:  Sometimes true    Inability: Sometimes true  . Transportation needs:    Medical: No    Non-medical: No  Tobacco Use  . Smoking status: Never Smoker  . Smokeless tobacco: Never Used  Substance and Sexual Activity  . Alcohol use: Not Currently    Comment: occ  . Drug use: No  . Sexual activity: Yes    Partners: Male    Birth control/protection: Surgical  Lifestyle  . Physical activity:    Days per week: 0 days    Minutes per session: Not on file  . Stress: Only a little  Relationships  . Social connections:    Talks on phone: More than three times a week    Gets together: More than three times a week    Attends religious service: More than 4 times per year    Active member of club or organization: Yes    Attends meetings of clubs or organizations: More than 4 times per year    Relationship status: Widowed  . Intimate partner violence:    Fear of current or ex partner: No    Emotionally abused: No    Physically abused: No    Forced sexual activity: No  Other Topics Concern  . 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    FAMILY HISTORY:  Family History  Problem Relation Age of Onset  . Heart failure Mother   . Stroke Mother   . Heart attack Mother   . Kidney failure Other   . Leukemia Father     CURRENT MEDICATIONS:  Outpatient Encounter Medications as of 07/15/2018  Medication Sig Note  . albuterol (PROVENTIL HFA;VENTOLIN HFA) 108 (90 BASE) MCG/ACT inhaler Inhale 2 puffs into the lungs every 6 (six) hours as needed for wheezing.   Marland Kitchen albuterol (PROVENTIL) (2.5 MG/3ML) 0.083% nebulizer solution Take 3 mLs (2.5 mg total) by nebulization every 6 (six) hours as needed for wheezing or shortness of breath.   Marland Kitchen amLODipine (NORVASC) 5 MG tablet Take 5 mg by mouth daily.    Marland Kitchen amoxicillin-clavulanate (AUGMENTIN) 875-125 MG tablet Take 1 tablet by mouth every 12 (twelve) hours.   . Blood Glucose Monitoring Suppl (TRUE METRIX METER) DEVI 1 Device by Does not apply route daily.   . chlorthalidone (HYGROTON) 25 MG tablet Take 25 mg by mouth daily.   Marland Kitchen HYDROcodone-homatropine (HYCODAN) 5-1.5 MG/5ML syrup Take 5 mLs by mouth every 4 (four) hours as needed for cough.    . losartan (COZAAR) 100 MG tablet Take 1 tablet (100 mg total) by mouth daily.   . pantoprazole (PROTONIX) 40 MG tablet Take 1 tablet (40 mg total) by mouth daily.   . prochlorperazine (COMPAZINE) 10 MG tablet Take 1 tablet (10 mg total) by mouth every 6 (six) hours as needed for nausea or vomiting.   . [DISCONTINUED] enoxaparin (LOVENOX) 150 MG/ML injection Inject 0.85 mLs (130 mg total) into the skin daily. Dispense 1 month supply for 130 mg subcu daily as needed.   Marland Kitchen LORazepam (ATIVAN) 0.5 MG tablet Take 0.5 mg by mouth at bedtime as needed for anxiety.   Marland Kitchen osimertinib mesylate (TAGRISSO) 80 MG tablet Take 1 tablet (80 mg total) by mouth daily.   . predniSONE (DELTASONE) 10 MG tablet Take 10 mg by mouth daily with breakfast. 07/06/2018: Started 07/01/18 Take 40 tablet daily for 3 days Take 30 mg daily for 3 days Take 20 mg daily for 3 days Take 10 mg daily for 3 days Take 5 mg daily for 3 days  . [DISCONTINUED] apixaban (ELIQUIS) 5 MG TABS tablet Take 1 tablet (5 mg total) by mouth 2 (two) times daily.   . [DISCONTINUED] enoxaparin (LOVENOX) 150 MG/ML injection Inject 0.85 mLs (130 mg total) into the skin daily. Do not take Eliquis on Friday 2/7. Take lovenox shot on Friday 2/08 and Saturday 2/09.   . [DISCONTINUED] 0.9 %  sodium chloride infusion    . [DISCONTINUED] albuterol (PROVENTIL) (2.5 MG/3ML) 0.083% nebulizer solution 2.5 mg    . [DISCONTINUED] ceFEPIme (MAXIPIME) 2 g in sodium chloride 0.9 % 100 mL IVPB    . [DISCONTINUED] chlorthalidone (HYGROTON) tablet 25 mg    . [DISCONTINUED] feeding  supplement (ENSURE ENLIVE) (ENSURE ENLIVE) liquid 237 mL    . [DISCONTINUED] heparin ADULT infusion 100 units/mL (25000 units/262m sodium chloride 0.45%)    . [DISCONTINUED] HYDROcodone-homatropine (HYCODAN) 5-1.5 MG/5ML syrup 5 mL    . [DISCONTINUED] insulin aspart (novoLOG) injection 0-20 Units    . [DISCONTINUED] insulin aspart (novoLOG) injection 0-5 Units    . [DISCONTINUED] insulin aspart (novoLOG) injection 6 Units    . [DISCONTINUED] LORazepam (ATIVAN) tablet 0.5 mg    . [DISCONTINUED] losartan (COZAAR)  tablet 100 mg    . [DISCONTINUED] MEDLINE mouth rinse    . [DISCONTINUED] pantoprazole (PROTONIX) EC tablet 40 mg    . [DISCONTINUED] polyethylene glycol (MIRALAX / GLYCOLAX) packet 17 g    . [DISCONTINUED] potassium chloride SA (K-DUR,KLOR-CON) CR tablet 40 mEq    . [DISCONTINUED] senna-docusate (Senokot-S) tablet 2 tablet    . [DISCONTINUED] sodium chloride flush (NS) 0.9 % injection 3 mL    . [DISCONTINUED] sodium chloride flush (NS) 0.9 % injection 3 mL    . [DISCONTINUED] traMADol (ULTRAM) tablet 50 mg     No facility-administered encounter medications on file as of 07/15/2018.     ALLERGIES:  Allergies  Allergen Reactions  . Banana Anaphylaxis  . Aspirin Other (See Comments)    G.I. Upset      PHYSICAL EXAM:  ECOG Performance status: 1  Vitals:   07/15/18 1000  BP: 102/68  Pulse: 98  Resp: 16  Temp: 98.4 F (36.9 C)  SpO2: 98%   Filed Weights   07/15/18 1000  Weight: 185 lb 3 oz (84 kg)    Physical Exam Constitutional:      Appearance: Normal appearance. She is normal weight.  Cardiovascular:     Rate and Rhythm: Normal rate and regular rhythm.     Heart sounds: Normal heart sounds.  Pulmonary:     Effort: Pulmonary effort is normal.     Breath sounds: Normal breath sounds.  Musculoskeletal: Normal range of motion.  Skin:    General: Skin is warm and dry.  Neurological:     Mental Status: She is alert and oriented to person, place, and time.  Mental status is at baseline.  Psychiatric:        Mood and Affect: Mood normal.        Behavior: Behavior normal.        Thought Content: Thought content normal.        Judgment: Judgment normal.      LABORATORY DATA:  I have reviewed the labs as listed.  CBC    Component Value Date/Time   WBC 8.5 07/12/2018 0531   RBC 4.04 07/12/2018 0531   HGB 10.7 (L) 07/12/2018 0531   HCT 32.4 (L) 07/12/2018 0531   PLT 321 07/12/2018 0531   MCV 80.2 07/12/2018 0531   MCH 26.5 07/12/2018 0531   MCHC 33.0 07/12/2018 0531   RDW 15.4 07/12/2018 0531   LYMPHSABS 1.1 07/06/2018 1239   MONOABS 2.2 (H) 07/06/2018 1239   EOSABS 0.0 07/06/2018 1239   BASOSABS 0.1 07/06/2018 1239   CMP Latest Ref Rng & Units 07/12/2018 07/11/2018 07/09/2018  Glucose 70 - 99 mg/dL 103(H) 119(H) 95  BUN 8 - 23 mg/dL 7(L) 9 14  Creatinine 0.44 - 1.00 mg/dL 0.71 0.79 0.89  Sodium 135 - 145 mmol/L 132(L) 132(L) 132(L)  Potassium 3.5 - 5.1 mmol/L 3.1(L) 2.7(LL) 3.0(L)  Chloride 98 - 111 mmol/L 96(L) 94(L) 93(L)  CO2 22 - 32 mmol/L _0 Calcium 8.9 - 10.3 mg/dL 8.8(L) 9.1 9.1  Total Protein 6.5 - 8.1 g/dL - - -  Total Bilirubin 0.3 - 1.2 mg/dL - - -  Alkaline Phos 38 - 126 U/L - - -  AST 15 - 41 U/L - - -  ALT 0 - 44 U/L - - -       DIAGNOSTIC IMAGING:  I have independently reviewed the scans and discussed with the patient.   I have reviewed Francene Finders, NP's note and agree  with the documentation.  I personally performed a face-to-face visit, made revisions and my assessment and plan is as follows.    ASSESSMENT & PLAN:   Adenocarcinoma of lung, right ([place]) 1.  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  She was never smoker. -CT PE protocol showed large pulmonary embolus in the descending interlobar pulmonary artery with occlusion of the right  lower lobe pulmonary artery and additional small clots noted in the right middle lobe and right upper lobes with associated right pleural effusion and right lower lobe infarct.  It also showed incidental left lower lobe mass measuring 3.8 x 3.7 x 2.1 cm associated with hilar and mediastinal adenopathy. - PET/CT scan on 05/14/2018 shows hypermetabolic left lower lobe lung mass measuring 3.7 cm, SUV of 11.  Bilateral hilar adenopathy and subcarinal adenopathy.  Both adrenal glands appear thickened and irregular with increased SUV.  Right lower lobe airspace disease has hypermetabolic rim and a hypermetabolic nodule medial to the main airspace disease.  This may represent evolving infection/inflammation. - MRI of the brain on 05/30/2018 shows 20 subcentimeter enhancing brain lesions consistent with metastasis, no edema. - Left lower lobe lung biopsy on 06/18/2018 consistent with adenocarcinoma.  There was not enough tissue to run molecular testing. - Biopsy of the right supraclavicular lymph node on 07/11/2018 was consistent with metastatic adenocarcinoma. -Today I talked to her about positive testing on guardant 360.  This enables her to receive targeted treatment with anti-EGFR TKI. -We talked about starting her on osimertinib 80 mg daily.  We talked about various side effects including skin rashes, electrolyte abnormalities, GI side effects including diarrhea, cytopenias, liver enzyme elevations and prolonged QT interval. -I have reviewed her recent EKG during hospitalization which showed QTC of 472 ms.  We will plan to repeat EKG 1 week after start of osimertinib.  We have sent prescription to specialty pharmacy.  She will be seen back in 1 week after starting the medication. -She is also being planned to receive radiation therapy to the brain by Dr. Tammi Klippel.  2.  Pulmonary embolus: -This is likely secondary to active malignancy.  CT PE protocol on 04/24/2018 shows PE in the right lower lobe pulmonary  artery. -She was started on Eliquis. -CT scan of the chest with contrast on 07/10/2018 shows bilateral pulmonary emboli, on the left lung new since the diagnosis. -She was switched to Lovenox injection once a day.  She is tolerating it well.  Total time spent is 40 minutes with more than 50% of the time spent face-to-face discussing mutation testing results, treatment options, side effects and coordination of care.    Orders placed this encounter:  Orders Placed This Encounter  Procedures  . Magnesium  . CBC with Differential/Platelet  . Comprehensive metabolic panel      Derek Jack, MD [place] [phone]

## 2018-07-15 NOTE — Progress Notes (Signed)
Briefly explained to patient about Tagrisso and I shared with her that a special oral chemotherapy pharmacist would be in touch with her to give more formal education.  I explained the process to the patient and asked that she give [place] a call when she receives the medicine.  She verbalizes understanding and consent was obtained to start [place].

## 2018-07-15 NOTE — Assessment & Plan Note (Addendum)
1.  Metastatic adenocarcinoma of the left lung with multiple brain mets: -Guardant 360 showing EGFR exon 19 deletion (EGFR [id number]) - Presentation to ER on 04/24/2018 with shortness of breath.  She was never smoker. -CT PE protocol showed large pulmonary embolus in the descending interlobar pulmonary artery with occlusion of the right lower lobe pulmonary artery and additional small clots noted in the right middle lobe and right upper lobes with associated right pleural effusion and right lower lobe infarct.  It also showed incidental left lower lobe mass measuring 3.8 x 3.7 x 2.1 cm associated with hilar and mediastinal adenopathy. - PET/CT scan on 05/14/2018 shows hypermetabolic left lower lobe lung mass measuring 3.7 cm, SUV of 11.  Bilateral hilar adenopathy and subcarinal adenopathy.  Both adrenal glands appear thickened and irregular with increased SUV.  Right lower lobe airspace disease has hypermetabolic rim and a hypermetabolic nodule medial to the main airspace disease.  This may represent evolving infection/inflammation. - MRI of the brain on 05/30/2018 shows 20 subcentimeter enhancing brain lesions consistent with metastasis, no edema. - Left lower lobe lung biopsy on 06/18/2018 consistent with adenocarcinoma.  There was not enough tissue to run molecular testing. - Biopsy of the right supraclavicular lymph node on 07/11/2018 was consistent with metastatic adenocarcinoma. -Today I talked to her about positive testing on guardant 360.  This enables her to receive targeted treatment with anti-EGFR TKI. -We talked about starting her on osimertinib 80 mg daily.  We talked about various side effects including skin rashes, electrolyte abnormalities, GI side effects including diarrhea, cytopenias, liver enzyme elevations and prolonged QT interval. -I have reviewed her recent EKG during hospitalization which showed QTC of 472 ms.  We will plan to repeat EKG 1 week after start of osimertinib.  We have  sent prescription to specialty pharmacy.  She will be seen back in 1 week after starting the medication. -She is also being planned to receive radiation therapy to the brain by Dr. Tammi Klippel.  2.  Pulmonary embolus: -This is likely secondary to active malignancy.  CT PE protocol on 04/24/2018 shows PE in the right lower lobe pulmonary artery. -She was started on Eliquis. -CT scan of the chest with contrast on 07/10/2018 shows bilateral pulmonary emboli, on the left lung new since the diagnosis. -She was switched to Lovenox injection once a day.  She is tolerating it well.

## 2018-07-15 NOTE — Progress Notes (Signed)
Nutrition Assessment  Reason for Assessment: MST+  ASSESSMENT:  68 y/o female w/ PMHx HTN, DM2. She presented to ED on Thanksgiving w/ acute onset SOB. She was subsequently found to have a PE and pulmonary mass. PET 12/18 findings consistent w/ metastatic disease. First seen at [facility] on 12/23. MRI brain  1/3 showed ~20 brain metastases. Lung Biopsy 1/22 + for adenocarcinoma.  Has not yet begun tx.   Pts initiation of treatment has been delayed d/t recent  hospitalization 2/9-2/15 for HCAP.   Seen today with 2 other family members. She reports she didn't eat well during her hospitalization, but her appetite has improved since she has been back [facility]. She is eating 3x a day, but the portions are small. She drinks 2 Ensures a day. RD took diet recall: Breakfast: Bacoh, eggs, grits Lunch: Turnip greens, leftovers Dinner: Frozen dinner   In addition to her poorer appetite, she reports intermittent nausea - no aggravating or palliating factors and it is not worsened by food.   She lives with several family members and says there is no lack of assistance with meals- "there is always something to eat".   She says her UBW is 213 lbs. She is 185 lbs today. Per chart, she was also 185 lbs when she was first seen at the [facility] 12/23. More long term, she was ~199 lbs when she was seen in the ED w/ her PE on Thanksgiving. This reflects a loss of 14 lbs (7% bw) x 3 months   She has not yet begun any sort of treatment. She says she may be receiving all her care at [facility].   Wt Readings from Last 10 Encounters:  07/15/18 185 lb 3 oz (84 kg)  07/06/18 189 lb 13.1 oz (86.1 kg)  07/03/18 188 lb 3.2 oz (85.4 kg)  06/26/18 186 lb 1 oz (84.4 kg)  06/16/18 181 lb 14.1 oz (82.5 kg)  05/27/18 182 lb (82.6 kg)  05/19/18 185 lb 4.8 oz (84.1 kg)  05/05/18 193 lb (87.5 kg)  04/24/18 198 lb 6.6 oz (90 kg)  04/15/18 199 lb (90.3 kg)   MEDICATIONS: Chemo: Tagrisso  Supportive medications: Compazine, Deltasone, ppi,  ativan, Hydrocodone, Other: Chlorthalidone, PO abx,   LABS: Bgs:80-120, K: 3.1, Na: 132  Recent Labs  Lab 07/09/18 0413 07/11/18 0347 07/12/18 0531  NA 132* 132* 132*  K 3.0* 2.7* 3.1*  CL 93* 94* 96*  CO2 28 28 28   BUN 14 9 7*  CREATININE 0.89 0.79 0.71  CALCIUM 9.1 9.1 8.8*  MG 1.7  --  1.9  GLUCOSE 95 119* 103*   ANTHROPOMETRICS: Height:  Ht Readings from Last 1 Encounters:  07/06/18 5\' 6"  (1.676 m)   Weight:  Wt Readings from Last 1 Encounters:  07/15/18 185 lb 3 oz (84 kg)   BMI:  BMI Readings from Last 1 Encounters:  07/15/18 29.89 kg/m   UBW: 213 lbs (per pt report) IBW: 59.1 kg Wt changes: -14 lbs x3 months (7% bw)  ESTIMATED ENERGY NEEDS:  Kcal: 1850-2100 (22-25 kcal/kg bw) Protein: 83-95g Pro (1.4-1.6g/kg ibw) Fluid: 1.9-2.1 L fluid (1 ml/kcal bw)  NUTRITION - FOCUSED PHYSICAL EXAM: Deferred   NUTRITION DIAGNOSIS:  Increased Protein/kcal needs related to cancer and cancer related treatments as evidenced by the estimated nutritional requirements for this condition.  DOCUMENTATION CODES:  Not applicable  INTERVENTION:  Discussed with pt the basics of nutrition therapy during treatment: prioritize eating high kcal/protein foods as able and eating frequently. Reviewed how weight  maintenance is a priority and leads to better outcomes. We reviewed common foods high in protein/kcals. RD went over the "Big Three" of cheese, Peanut butter and whole milk which are some of the most common foods that are high in both kcals and protein.   RD went through dietary recall and either gave approval or detailed how to increase kcal/pro content of pts meals or the food items reported.  RD explained concept of never eating a food by itself. She should add toppings, condiments, dressings, creams, syrups etc to her meals. Examples given are adding cheese, crackers to soup and adding heaving dressings, nuts, cheese, to her salads. These items are very calorie dense and  take up little space in the stomach.   RD reviewed the Ensure Assistance program. She does not like vanilla, but was willing to try adding choc/straw syrup to them. RD provided first case of Ensure to pt   Regarding her nausea, RD recommended taking her compazine scheduled as opposed to PRN so that nausea will be less likely to impact her intake.   GOAL:  Oral intake to meet >90% of needs, wt stability  MONITOR:  Chemotherapy tolerance, labs, weights, radiation tolerance, site of care (apcc vs wlcc?)   Next Visit: 3/10- before office visit  Burtis Junes RD, LDN, [place] Clinical Nutrition Available Tues-Sat via Pager: [phone] 07/15/2018 11:16 AM

## 2018-07-15 NOTE — Telephone Encounter (Signed)
Oral Oncology Patient Advocate Encounter  Received notification from [facility] that prior authorization for Tagrisso is required.  PA submitted on CoverMyMeds Key [id number] Status is pending  Oral Oncology Clinic will continue to follow.  [place] Patient Kaitlin [name] Phone [phone] Fax [phone] 07/15/2018 1:27 PM

## 2018-07-15 NOTE — Telephone Encounter (Signed)
Oral Oncology Pharmacist Encounter  Received new prescription for Tagrisso (osimertinib) for the treatment of metastatic adenocarcinoma of the left lung EGFR exon 19 deletion, planned duration until disease progression or unacceptable drug toxicity.  CMP from 07/06/2018 assessed, no relevant lab abnormalities. Prescription dose and frequency assessed.   Current medication list in Epic reviewed, no DDIs with osimertinib identified.  Prescription has been e-scribed to the [facility] for benefits analysis and approval.  Oral Oncology Clinic will continue to follow for insurance authorization, copayment issues, initial counseling and start date.  Darl Pikes, PharmD, BCPS, [facility] Hematology/Oncology Clinical Pharmacist [facility]/AP Oral [place] [facility] [phone]  07/15/2018 3:41 PM

## 2018-07-15 NOTE — Patient Instructions (Signed)
[place] at [facility] Discharge Instructions  Follow up in 2 weeks with labs    Thank you for choosing [place] at [facility] to provide your oncology and hematology care.  To afford each patient quality time with our provider, please arrive at least 15 minutes before your scheduled appointment time.   If you have a lab appointment with the [facility] please come in thru the  Main Entrance and check in at the main information desk  You need to re-schedule your appointment should you arrive 10 or more minutes late.  We strive to give you quality time with our providers, and arriving late affects you and other patients whose appointments are after yours.  Also, if you no show three or more times for appointments you may be dismissed from the clinic at the providers discretion.     Again, thank you for choosing [facility].  Our hope is that these requests will decrease the amount of time that you wait before being seen by our physicians.       _____________________________________________________________  Should you have questions after your visit to [facility], please contact our office at [phone] between the hours of 8:00 a.m. and 4:30 p.m.  Voicemails left after 4:00 p.m. will not be returned until the following business day.  For prescription refill requests, have your pharmacy contact our office and allow 72 hours.    Cancer Center Support Programs:   > Cancer Support Group  2nd Tuesday of the month 1pm-2pm, Journey Room

## 2018-07-16 ENCOUNTER — Ambulatory Visit
Admission: RE | Admit: 2018-07-16 | Discharge: 2018-07-16 | Disposition: A | Discharge status: Home / Self Care | Payer: Medicare HMO | Source: Ambulatory Visit | Attending: Radiation Oncology | Admitting: Radiation Oncology

## 2018-07-16 ENCOUNTER — Encounter (HOSPITAL_COMMUNITY): Payer: Self-pay | Admitting: Hematology

## 2018-07-16 DIAGNOSIS — I10 Essential (primary) hypertension: Secondary | ICD-10-CM | POA: Diagnosis not present

## 2018-07-16 DIAGNOSIS — R040 Epistaxis: Secondary | ICD-10-CM | POA: Diagnosis not present

## 2018-07-16 DIAGNOSIS — I2699 Other pulmonary embolism without acute cor pulmonale: Secondary | ICD-10-CM | POA: Diagnosis not present

## 2018-07-16 DIAGNOSIS — C3492 Malignant neoplasm of unspecified part of left bronchus or lung: Secondary | ICD-10-CM | POA: Diagnosis not present

## 2018-07-16 DIAGNOSIS — C7931 Secondary malignant neoplasm of brain: Secondary | ICD-10-CM | POA: Diagnosis not present

## 2018-07-16 DIAGNOSIS — Z510 Encounter for antineoplastic radiation therapy: Secondary | ICD-10-CM | POA: Diagnosis not present

## 2018-07-16 DIAGNOSIS — C3432 Malignant neoplasm of lower lobe, left bronchus or lung: Secondary | ICD-10-CM | POA: Diagnosis not present

## 2018-07-16 NOTE — Telephone Encounter (Signed)
Left voicemail for patient to call me back.  She needs to sign forms for [employer] manufacturer assistance.

## 2018-07-16 NOTE — Telephone Encounter (Signed)
Oral Oncology Patient Advocate Encounter  Prior Authorization for [employer] has been approved.    PA# [id number] Effective dates: 07/15/2018 through 01/11/2019  Patients co-pay is $2516.62. Will seek manufacturer assistance since grant funding is closed.  Oral Oncology Clinic will continue to follow.   [place] Patient [place] Phone [phone] Fax [phone] 07/16/2018 8:17 AM

## 2018-07-17 ENCOUNTER — Ambulatory Visit
Admission: RE | Admit: 2018-07-17 | Discharge: 2018-07-17 | Disposition: A | Discharge status: Home / Self Care | Payer: Medicare HMO | Source: Ambulatory Visit | Attending: Radiation Oncology | Admitting: Radiation Oncology

## 2018-07-17 DIAGNOSIS — I2699 Other pulmonary embolism without acute cor pulmonale: Secondary | ICD-10-CM | POA: Diagnosis not present

## 2018-07-17 DIAGNOSIS — Z510 Encounter for antineoplastic radiation therapy: Secondary | ICD-10-CM | POA: Diagnosis not present

## 2018-07-17 DIAGNOSIS — C7931 Secondary malignant neoplasm of brain: Secondary | ICD-10-CM | POA: Diagnosis not present

## 2018-07-17 DIAGNOSIS — C3492 Malignant neoplasm of unspecified part of left bronchus or lung: Secondary | ICD-10-CM | POA: Diagnosis not present

## 2018-07-17 DIAGNOSIS — C3432 Malignant neoplasm of lower lobe, left bronchus or lung: Secondary | ICD-10-CM | POA: Diagnosis not present

## 2018-07-17 DIAGNOSIS — I10 Essential (primary) hypertension: Secondary | ICD-10-CM | POA: Diagnosis not present

## 2018-07-18 ENCOUNTER — Telehealth (HOSPITAL_COMMUNITY): Payer: Self-pay | Admitting: Pharmacy Technician

## 2018-07-18 ENCOUNTER — Ambulatory Visit
Admission: RE | Admit: 2018-07-18 | Discharge: 2018-07-18 | Disposition: A | Discharge status: Home / Self Care | Payer: Medicare HMO | Source: Ambulatory Visit | Attending: Radiation Oncology | Admitting: Radiation Oncology

## 2018-07-18 DIAGNOSIS — C3492 Malignant neoplasm of unspecified part of left bronchus or lung: Secondary | ICD-10-CM | POA: Diagnosis not present

## 2018-07-18 DIAGNOSIS — C3432 Malignant neoplasm of lower lobe, left bronchus or lung: Secondary | ICD-10-CM | POA: Diagnosis not present

## 2018-07-18 DIAGNOSIS — Z510 Encounter for antineoplastic radiation therapy: Secondary | ICD-10-CM | POA: Diagnosis not present

## 2018-07-18 DIAGNOSIS — C7931 Secondary malignant neoplasm of brain: Secondary | ICD-10-CM | POA: Diagnosis not present

## 2018-07-18 NOTE — Telephone Encounter (Signed)
Oral Oncology Patient Advocate Encounter  Patient stopped by [facility] to complete application for [place] in an effort to reduce the patient's out of pocket expense for Tagrisso to $0.    Application completed and faxed to [phone].   [place] patient assistance phone number for follow up is [phone].   This encounter will be updated until final determination.  [place] Patient [place] Phone [phone] Fax [phone] 07/18/2018 10:28 AM

## 2018-07-18 NOTE — Progress Notes (Signed)
Radiation Oncology         [phone] ________________________________  Name: Kaitlin [name] MRN: [id number]  Date: 07/14/2018  DOB: 1950-11-15  SIMULATION AND TREATMENT PLANNING NOTE    ICD-10-CM   1. Brain metastases (HCC) C79.31   2. Adenocarcinoma, lung, left (HCC) C34.92     DIAGNOSIS:  68 yo woman with brain metastases from cancer of the left lower lung  NARRATIVE:  The patient was brought to the CT Simulation planning suite.  Identity was confirmed.  All relevant records and images related to the planned course of therapy were reviewed.  The patient freely provided informed written consent to proceed with treatment after reviewing the details related to the planned course of therapy. The consent form was witnessed and verified by the simulation staff.  Then, the patient was set-up in a stable reproducible  supine position for radiation therapy.  CT images were obtained.  Surface markings were placed.  The CT images were loaded into the planning software.  Then the target and avoidance structures were contoured.  Treatment planning then occurred.  The radiation prescription was entered and confirmed.  Then, I designed and supervised the construction of a total of 3 medically necessary complex treatment devices, including a custom made thermoplastic mask used for immobilization and two complex multileaf collimators to cover the entire intracranial contents, while shielding the eyes and face.  Each [facility] is independently created to account for beam divergence.  The right and left lateral fields will be treated with 6 MV X-rays.  I have requested : Isodose Plan.  A second radiation plan to the chest was also set up with 3 [facility] devices and a 3D plan with DVHs of the target, left lung, right lung, heart and spinal cord.  PLAN:  The whole brain and chest will be treated to 30 Gy in 10 fractions.  ________________________________  Artist Pais Kathrynn Running, M.D.

## 2018-07-21 ENCOUNTER — Ambulatory Visit
Admission: RE | Admit: 2018-07-21 | Discharge: 2018-07-21 | Disposition: A | Discharge status: Home / Self Care | Payer: Medicare HMO | Source: Ambulatory Visit | Attending: Radiation Oncology | Admitting: Radiation Oncology

## 2018-07-21 DIAGNOSIS — Z510 Encounter for antineoplastic radiation therapy: Secondary | ICD-10-CM | POA: Diagnosis not present

## 2018-07-21 DIAGNOSIS — C7931 Secondary malignant neoplasm of brain: Secondary | ICD-10-CM | POA: Diagnosis not present

## 2018-07-21 DIAGNOSIS — C3492 Malignant neoplasm of unspecified part of left bronchus or lung: Secondary | ICD-10-CM | POA: Diagnosis not present

## 2018-07-21 DIAGNOSIS — C3432 Malignant neoplasm of lower lobe, left bronchus or lung: Secondary | ICD-10-CM | POA: Diagnosis not present

## 2018-07-21 NOTE — Telephone Encounter (Addendum)
Oral Oncology Patient Advocate Encounter  Received notification from [employer]Me Prescription Savings program that patient has been successfully enrolled into their program to receive Tagrisso from the manufacturer at $0 out of pocket until 05/28/2019.    I called and left a message for the patient to return my call.    She should receive medication in 7-10 days. I will remind her to call us when she receives the medication so the pharmacist can educate her.  Oral Oncology Clinic will continue to follow.  [place] Patient [place] Phone [phone] Fax [phone] 07/21/2018 1:23 PM

## 2018-07-22 ENCOUNTER — Ambulatory Visit
Admission: RE | Admit: 2018-07-22 | Discharge: 2018-07-22 | Disposition: A | Discharge status: Home / Self Care | Payer: Medicare HMO | Source: Ambulatory Visit | Attending: Radiation Oncology | Admitting: Radiation Oncology

## 2018-07-22 DIAGNOSIS — Z510 Encounter for antineoplastic radiation therapy: Secondary | ICD-10-CM | POA: Diagnosis not present

## 2018-07-22 DIAGNOSIS — C3492 Malignant neoplasm of unspecified part of left bronchus or lung: Secondary | ICD-10-CM | POA: Diagnosis not present

## 2018-07-22 DIAGNOSIS — C7931 Secondary malignant neoplasm of brain: Secondary | ICD-10-CM | POA: Diagnosis not present

## 2018-07-22 DIAGNOSIS — C3432 Malignant neoplasm of lower lobe, left bronchus or lung: Secondary | ICD-10-CM | POA: Diagnosis not present

## 2018-07-22 NOTE — Telephone Encounter (Signed)
Spoke with Ronney Asters and patient on the phone. I let them know of approval to get medication through AZ&Me.  I gave her the phone number to call and check delivery of medication.  I told her to please call us before starting the medication so the pharmacist can educate her on it first.   She has my number and will contact me when medication received.  I will also call the pharmacy to check delivery status.  [place] Patient [place] Phone [phone] Fax [phone] 07/22/2018 10:21 AM

## 2018-07-23 ENCOUNTER — Ambulatory Visit
Admission: RE | Admit: 2018-07-23 | Discharge: 2018-07-23 | Disposition: A | Discharge status: Home / Self Care | Payer: Medicare HMO | Source: Ambulatory Visit | Attending: Radiation Oncology | Admitting: Radiation Oncology

## 2018-07-23 DIAGNOSIS — C3492 Malignant neoplasm of unspecified part of left bronchus or lung: Secondary | ICD-10-CM | POA: Diagnosis not present

## 2018-07-23 DIAGNOSIS — C7931 Secondary malignant neoplasm of brain: Secondary | ICD-10-CM | POA: Diagnosis not present

## 2018-07-23 DIAGNOSIS — C3432 Malignant neoplasm of lower lobe, left bronchus or lung: Secondary | ICD-10-CM | POA: Diagnosis not present

## 2018-07-23 DIAGNOSIS — Z510 Encounter for antineoplastic radiation therapy: Secondary | ICD-10-CM | POA: Diagnosis not present

## 2018-07-24 ENCOUNTER — Ambulatory Visit
Admission: RE | Admit: 2018-07-24 | Discharge: 2018-07-24 | Disposition: A | Discharge status: Home / Self Care | Payer: Medicare HMO | Source: Ambulatory Visit | Attending: Radiation Oncology | Admitting: Radiation Oncology

## 2018-07-24 DIAGNOSIS — C3432 Malignant neoplasm of lower lobe, left bronchus or lung: Secondary | ICD-10-CM | POA: Diagnosis not present

## 2018-07-24 DIAGNOSIS — Z510 Encounter for antineoplastic radiation therapy: Secondary | ICD-10-CM | POA: Diagnosis not present

## 2018-07-24 DIAGNOSIS — C3492 Malignant neoplasm of unspecified part of left bronchus or lung: Secondary | ICD-10-CM | POA: Diagnosis not present

## 2018-07-24 DIAGNOSIS — C7931 Secondary malignant neoplasm of brain: Secondary | ICD-10-CM | POA: Diagnosis not present

## 2018-07-24 NOTE — Telephone Encounter (Signed)
Received fax from [employer] that they had tried to call the patient to set up shipment but were unsuccessful.  I called the patient and spoke with Kaitlin [name].  Patient was getting radiation at that time, so I told her when she was finished to call [employer] to set up shipment.

## 2018-07-25 ENCOUNTER — Ambulatory Visit
Admission: RE | Admit: 2018-07-25 | Discharge: 2018-07-25 | Disposition: A | Discharge status: Home / Self Care | Payer: Medicare HMO | Source: Ambulatory Visit | Attending: Radiation Oncology | Admitting: Radiation Oncology

## 2018-07-25 DIAGNOSIS — C7931 Secondary malignant neoplasm of brain: Secondary | ICD-10-CM | POA: Diagnosis not present

## 2018-07-25 DIAGNOSIS — C3432 Malignant neoplasm of lower lobe, left bronchus or lung: Secondary | ICD-10-CM | POA: Diagnosis not present

## 2018-07-25 DIAGNOSIS — C3492 Malignant neoplasm of unspecified part of left bronchus or lung: Secondary | ICD-10-CM | POA: Diagnosis not present

## 2018-07-25 DIAGNOSIS — Z510 Encounter for antineoplastic radiation therapy: Secondary | ICD-10-CM | POA: Diagnosis not present

## 2018-07-28 ENCOUNTER — Ambulatory Visit: Payer: Medicare HMO | Admitting: Pulmonary Disease

## 2018-07-28 ENCOUNTER — Ambulatory Visit
Admission: RE | Admit: 2018-07-28 | Discharge: 2018-07-28 | Disposition: A | Discharge status: Home / Self Care | Payer: Medicare HMO | Source: Ambulatory Visit | Attending: Radiation Oncology | Admitting: Radiation Oncology

## 2018-07-28 DIAGNOSIS — C3432 Malignant neoplasm of lower lobe, left bronchus or lung: Secondary | ICD-10-CM | POA: Diagnosis not present

## 2018-07-28 DIAGNOSIS — C7931 Secondary malignant neoplasm of brain: Secondary | ICD-10-CM | POA: Diagnosis not present

## 2018-07-28 DIAGNOSIS — C3492 Malignant neoplasm of unspecified part of left bronchus or lung: Secondary | ICD-10-CM | POA: Insufficient documentation

## 2018-07-28 DIAGNOSIS — Z510 Encounter for antineoplastic radiation therapy: Secondary | ICD-10-CM | POA: Diagnosis not present

## 2018-07-28 MED ORDER — SONAFINE EX EMUL
1.0000 "application " | Freq: Once | CUTANEOUS | Status: AC
  Administered 2018-07-28: 1 via TOPICAL

## 2018-07-28 NOTE — Telephone Encounter (Signed)
Called to check delivery status of medication.  [employer] states that medication should be delivered today 07/28/2018 by 4:30pm.  Patient must be there to sign for package.

## 2018-07-29 ENCOUNTER — Encounter (HOSPITAL_COMMUNITY): Payer: Self-pay | Admitting: *Deleted

## 2018-07-29 ENCOUNTER — Ambulatory Visit
Admission: RE | Admit: 2018-07-29 | Discharge: 2018-07-29 | Disposition: A | Discharge status: Home / Self Care | Payer: Medicare HMO | Source: Ambulatory Visit | Attending: Radiation Oncology | Admitting: Radiation Oncology

## 2018-07-29 ENCOUNTER — Encounter: Payer: Self-pay | Admitting: Radiation Oncology

## 2018-07-29 DIAGNOSIS — C3492 Malignant neoplasm of unspecified part of left bronchus or lung: Secondary | ICD-10-CM | POA: Diagnosis not present

## 2018-07-29 DIAGNOSIS — C3432 Malignant neoplasm of lower lobe, left bronchus or lung: Secondary | ICD-10-CM | POA: Diagnosis not present

## 2018-07-29 DIAGNOSIS — C7931 Secondary malignant neoplasm of brain: Secondary | ICD-10-CM | POA: Diagnosis not present

## 2018-07-29 DIAGNOSIS — Z510 Encounter for antineoplastic radiation therapy: Secondary | ICD-10-CM | POA: Diagnosis not present

## 2018-07-29 NOTE — Progress Notes (Signed)
I received a call from the patient and she received the medication yesterday and took her first dose last night on 3/2.

## 2018-07-31 ENCOUNTER — Other Ambulatory Visit (HOSPITAL_COMMUNITY): Payer: Self-pay | Admitting: Nurse Practitioner

## 2018-07-31 DIAGNOSIS — R110 Nausea: Secondary | ICD-10-CM

## 2018-07-31 MED ORDER — PROCHLORPERAZINE MALEATE 10 MG PO TABS: 10.0000 mg | ORAL_TABLET | Freq: Four times a day (QID) | ORAL | 3 refills | Status: DC | PRN

## 2018-08-05 ENCOUNTER — Encounter (HOSPITAL_COMMUNITY): Payer: Self-pay | Admitting: Hematology

## 2018-08-05 ENCOUNTER — Encounter: Payer: Self-pay | Admitting: Pulmonary Disease

## 2018-08-05 ENCOUNTER — Ambulatory Visit (INDEPENDENT_AMBULATORY_CARE_PROVIDER_SITE_OTHER): Payer: Medicare HMO | Admitting: Pulmonary Disease

## 2018-08-05 ENCOUNTER — Other Ambulatory Visit: Payer: Self-pay

## 2018-08-05 ENCOUNTER — Inpatient Hospital Stay (HOSPITAL_COMMUNITY): Payer: Medicare HMO | Admitting: Dietician

## 2018-08-05 ENCOUNTER — Inpatient Hospital Stay (HOSPITAL_COMMUNITY): Payer: Medicare HMO

## 2018-08-05 ENCOUNTER — Inpatient Hospital Stay (HOSPITAL_COMMUNITY): Payer: Medicare HMO | Attending: Hematology | Admitting: Hematology

## 2018-08-05 VITALS — BP 97/66 | HR 101 | Temp 97.7°F | Resp 16 | Wt 170.3 lb

## 2018-08-05 VITALS — BP 98/68 | HR 86 | Ht 66.0 in | Wt 174.0 lb

## 2018-08-05 DIAGNOSIS — I2699 Other pulmonary embolism without acute cor pulmonale: Secondary | ICD-10-CM | POA: Diagnosis not present

## 2018-08-05 DIAGNOSIS — C3432 Malignant neoplasm of lower lobe, left bronchus or lung: Secondary | ICD-10-CM | POA: Diagnosis not present

## 2018-08-05 DIAGNOSIS — I10 Essential (primary) hypertension: Secondary | ICD-10-CM | POA: Insufficient documentation

## 2018-08-05 DIAGNOSIS — Z86711 Personal history of pulmonary embolism: Secondary | ICD-10-CM | POA: Insufficient documentation

## 2018-08-05 DIAGNOSIS — E46 Unspecified protein-calorie malnutrition: Secondary | ICD-10-CM | POA: Insufficient documentation

## 2018-08-05 DIAGNOSIS — E119 Type 2 diabetes mellitus without complications: Secondary | ICD-10-CM | POA: Diagnosis not present

## 2018-08-05 DIAGNOSIS — C7931 Secondary malignant neoplasm of brain: Secondary | ICD-10-CM | POA: Insufficient documentation

## 2018-08-05 DIAGNOSIS — Z7901 Long term (current) use of anticoagulants: Secondary | ICD-10-CM | POA: Insufficient documentation

## 2018-08-05 DIAGNOSIS — Z79899 Other long term (current) drug therapy: Secondary | ICD-10-CM | POA: Diagnosis not present

## 2018-08-05 DIAGNOSIS — E860 Dehydration: Secondary | ICD-10-CM | POA: Diagnosis not present

## 2018-08-05 DIAGNOSIS — C3492 Malignant neoplasm of unspecified part of left bronchus or lung: Secondary | ICD-10-CM

## 2018-08-05 DIAGNOSIS — N289 Disorder of kidney and ureter, unspecified: Secondary | ICD-10-CM | POA: Diagnosis not present

## 2018-08-05 DIAGNOSIS — K30 Functional dyspepsia: Secondary | ICD-10-CM

## 2018-08-05 DIAGNOSIS — Z86718 Personal history of other venous thrombosis and embolism: Secondary | ICD-10-CM | POA: Insufficient documentation

## 2018-08-05 LAB — COMPREHENSIVE METABOLIC PANEL
ALT: 17 U/L (ref 0–44)
AST: 16 U/L (ref 15–41)
Albumin: 3.8 g/dL (ref 3.5–5.0)
Alkaline Phosphatase: 58 U/L (ref 38–126)
Anion gap: 9 (ref 5–15)
BUN: 44 mg/dL — ABNORMAL HIGH (ref 8–23)
CALCIUM: 10 mg/dL (ref 8.9–10.3)
CO2: 26 mmol/L (ref 22–32)
Chloride: 101 mmol/L (ref 98–111)
Creatinine, Ser: 1.71 mg/dL — ABNORMAL HIGH (ref 0.44–1.00)
GFR calc Af Amer: 35 mL/min — ABNORMAL LOW (ref >60)
GFR calc non Af Amer: 30 mL/min — ABNORMAL LOW (ref >60)
Glucose, Bld: 132 mg/dL — ABNORMAL HIGH (ref 70–99)
Potassium: 3.8 mmol/L (ref 3.5–5.1)
Sodium: 136 mmol/L (ref 135–145)
Total Bilirubin: 0.6 mg/dL (ref 0.3–1.2)
Total Protein: 7.8 g/dL (ref 6.5–8.1)

## 2018-08-05 LAB — CBC WITH DIFFERENTIAL/PLATELET
Abs Immature Granulocytes: 0.02 10*3/uL (ref 0.00–0.07)
Basophils Absolute: 0 10*3/uL (ref 0.0–0.1)
Basophils Relative: 1 %
Eosinophils Absolute: 0.1 10*3/uL (ref 0.0–0.5)
Eosinophils Relative: 2 %
HCT: 42.4 % (ref 36.0–46.0)
Hemoglobin: 13.3 g/dL (ref 12.0–15.0)
Immature Granulocytes: 0 %
LYMPHS ABS: 0.9 10*3/uL (ref 0.7–4.0)
Lymphocytes Relative: 18 %
MCH: 25.5 pg — ABNORMAL LOW (ref 26.0–34.0)
MCHC: 31.4 g/dL (ref 30.0–36.0)
MCV: 81.4 fL (ref 80.0–100.0)
Monocytes Absolute: 0.6 10*3/uL (ref 0.1–1.0)
Monocytes Relative: 11 %
Neutro Abs: 3.5 10*3/uL (ref 1.7–7.7)
Neutrophils Relative %: 68 %
Platelets: 201 10*3/uL (ref 150–400)
RBC: 5.21 MIL/uL — ABNORMAL HIGH (ref 3.87–5.11)
RDW: 16 % — ABNORMAL HIGH (ref 11.5–15.5)
WBC: 5.2 10*3/uL (ref 4.0–10.5)
nRBC: 0 % (ref 0.0–0.2)

## 2018-08-05 LAB — MAGNESIUM: Magnesium: 1.8 mg/dL (ref 1.7–2.4)

## 2018-08-05 MED ORDER — PANTOPRAZOLE SODIUM 40 MG PO TBEC: 40.0000 mg | DELAYED_RELEASE_TABLET | Freq: Every day | ORAL | 2 refills | Status: DC

## 2018-08-05 NOTE — Progress Notes (Signed)
[place], [place] [address]   CLINIC:  Medical Oncology/Hematology  PCP:  [name], MD [address] [place] [id number] [phone]   REASON FOR VISIT: Follow-up for adenocarcinoma of the lung  CURRENT THERAPY:Tegrisso  INTERVAL HISTORY:  Ms. Heaton 68 y.o. female returns for routine follow-up for adenocarcinoma of the lung. She is here today with her family. Sh eis feeling weaker than last visit. She is not eating as well due to taste and lack of appetite. She can only tolerate a half a can of boost daily. She is drinking fluids okay and eating mostly soups. She does have nausea off and on through the day. She is not very active at home. She is mostly watching TV. She occasionally will get up and clean but will have to rest frequently. Denies any nausea, vomiting, or diarrhea. Denies any new pains. Had not noticed any recent bleeding such as epistaxis, hematuria or hematochezia. Denies recent chest pain on exertion, shortness of breath on minimal exertion, pre-syncopal episodes, or palpitations. Denies any numbness or tingling in hands or feet. Denies any recent fevers, infections, or recent hospitalizations. Patient reports appetite at 50% and energy level at 0%.   REVIEW OF SYSTEMS:  Review of Systems  Constitutional: Positive for appetite change and fatigue.  HENT:   Positive for trouble swallowing.   Gastrointestinal: Positive for nausea.  Neurological: Positive for extremity weakness.  All other systems reviewed and are negative.    PAST MEDICAL/SURGICAL HISTORY:  Past Medical History:  Diagnosis Date  . Asthma   . Carpal tunnel syndrome   . Cataract    left eye  . Diabetes mellitus without complication ([place])   . DVT (deep venous thrombosis) ([place])   . Hypertension   . Pulmonary embolism ([place]) 03/2018   Past Surgical History:  Procedure Laterality Date  . ABDOMINAL HYSTERECTOMY    . biopsy of right breast    .  FOOT SURGERY    . implant left eye    . INTRAOCULAR LENS INSERTION       SOCIAL HISTORY:  Social History   Socioeconomic History  . Marital status: Widowed    Spouse name: Not on file  . Number of children: 4  . Years of education: 62  . Highest education level: High school graduate  Occupational History  . Not on file  Social Needs  . Financial resource strain: Very hard  . Food insecurity:    Worry: Sometimes true    Inability: Sometimes true  . Transportation needs:    Medical: No    Non-medical: No  Tobacco Use  . Smoking status: Never Smoker  . Smokeless tobacco: Never Used  Substance and Sexual Activity  . Alcohol use: Not Currently    Comment: occ  . Drug use: No  . Sexual activity: Yes    Partners: Male    Birth control/protection: Surgical  Lifestyle  . Physical activity:    Days per week: 0 days    Minutes per session: Not on file  . Stress: Only a little  Relationships  . Social connections:    Talks on phone: More than three times a week    Gets together: More than three times a week    Attends religious service: More than 4 times per year    Active member of club or organization: Yes    Attends meetings of clubs or organizations: More than 4 times per year  Relationship status: Widowed  . Intimate partner violence:    Fear of current or ex partner: No    Emotionally abused: No    Physically abused: No    Forced sexual activity: No  Other Topics Concern  . 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    FAMILY HISTORY:  Family History  Problem Relation Age of Onset  . Heart failure Mother   . Stroke Mother   . Heart attack Mother   . Kidney failure Other   . Leukemia Father     CURRENT MEDICATIONS:  Outpatient Encounter Medications  as of 08/05/2018  Medication Sig Note  . albuterol (PROVENTIL HFA;VENTOLIN HFA) 108 (90 BASE) MCG/ACT inhaler Inhale 2 puffs into the lungs every 6 (six) hours as needed for wheezing.   Marland Kitchen albuterol (PROVENTIL) (2.5 MG/3ML) 0.083% nebulizer solution Take 3 mLs (2.5 mg total) by nebulization every 6 (six) hours as needed for wheezing or shortness of breath.   Marland Kitchen amLODipine (NORVASC) 5 MG tablet Take 5 mg by mouth daily.   . Blood Glucose Monitoring Suppl (TRUE METRIX METER) DEVI 1 Device by Does not apply route daily.   . chlorthalidone (HYGROTON) 25 MG tablet Take 25 mg by mouth daily.   Marland Kitchen LORazepam (ATIVAN) 0.5 MG tablet Take 0.5 mg by mouth at bedtime as needed for anxiety.   Marland Kitchen losartan (COZAAR) 100 MG tablet Take 1 tablet (100 mg total) by mouth daily.   Marland Kitchen osimertinib mesylate (TAGRISSO) 80 MG tablet Take 1 tablet (80 mg total) by mouth daily.   . pantoprazole (PROTONIX) 40 MG tablet Take 1 tablet (40 mg total) by mouth daily.   . prochlorperazine (COMPAZINE) 10 MG tablet Take 1 tablet (10 mg total) by mouth every 6 (six) hours as needed for nausea or vomiting.   . [DISCONTINUED] pantoprazole (PROTONIX) 40 MG tablet Take 1 tablet (40 mg total) by mouth daily.   . [DISCONTINUED] predniSONE (DELTASONE) 10 MG tablet Take 10 mg by mouth daily with breakfast. 07/06/2018: Started 07/01/18 Take 40 tablet daily for 3 days Take 30 mg daily for 3 days Take 20 mg daily for 3 days Take 10 mg daily for 3 days Take 5 mg daily for 3 days  . TRUE METRIX BLOOD GLUCOSE TEST test strip    . TRUEplus Lancets 30G MISC    . [DISCONTINUED] amoxicillin-clavulanate (AUGMENTIN) 875-125 MG tablet Take 1 tablet by mouth every 12 (twelve) hours.   . [DISCONTINUED] HYDROcodone-homatropine (HYCODAN) 5-1.5 MG/5ML syrup Take 5 mLs by mouth every 4 (four) hours as needed for cough.     No facility-administered encounter medications on file as of 08/05/2018.     ALLERGIES:  Allergies  Allergen Reactions  . Banana Anaphylaxis  .  Aspirin Other (See Comments)    G.I. Upset      PHYSICAL EXAM:  ECOG Performance status: 1  Vitals:   08/05/18 1126  BP: 97/66  Pulse: (!) 101  Resp: 16  Temp: 97.7 F (36.5 C)  SpO2: 100%   Filed Weights   08/05/18 1126  Weight: 170 lb 4.8 oz (77.2 kg)    Physical Exam Constitutional:      Appearance: Normal appearance. She is normal weight.  Cardiovascular:     Rate and  Rhythm: Normal rate and regular rhythm.     Heart sounds: Normal heart sounds.  Pulmonary:     Effort: Pulmonary effort is normal.     Breath sounds: Normal breath sounds.  Musculoskeletal: Normal range of motion.  Skin:    General: Skin is warm and dry.  Neurological:     Mental Status: She is alert and oriented to person, place, and time. Mental status is at baseline.  Psychiatric:        Mood and Affect: Mood normal.        Behavior: Behavior normal.        Thought Content: Thought content normal.        Judgment: Judgment normal.      LABORATORY DATA:  I have reviewed the labs as listed.  CBC    Component Value Date/Time   WBC 5.2 08/05/2018 1245   RBC 5.21 (H) 08/05/2018 1245   HGB 13.3 08/05/2018 1245   HCT 42.4 08/05/2018 1245   PLT 201 08/05/2018 1245   MCV 81.4 08/05/2018 1245   MCH 25.5 (L) 08/05/2018 1245   MCHC 31.4 08/05/2018 1245   RDW 16.0 (H) 08/05/2018 1245   LYMPHSABS 0.9 08/05/2018 1245   MONOABS 0.6 08/05/2018 1245   EOSABS 0.1 08/05/2018 1245   BASOSABS 0.0 08/05/2018 1245   CMP Latest Ref Rng & Units 08/05/2018 07/12/2018 07/11/2018  Glucose 70 - 99 mg/dL 132(H) 103(H) 119(H)  BUN 8 - 23 mg/dL 44(H) 7(L) 9  Creatinine 0.44 - 1.00 mg/dL 1.71(H) 0.71 0.79  Sodium 135 - 145 mmol/L 136 132(L) 132(L)  Potassium 3.5 - 5.1 mmol/L 3.8 3.1(L) 2.7(LL)  Chloride 98 - 111 mmol/L 101 96(L) 94(L)  CO2 22 - 32 mmol/L _0 Calcium 8.9 - 10.3 mg/dL 10.0 8.8(L) 9.1  Total Protein 6.5 - 8.1 g/dL 7.8 - -  Total Bilirubin 0.3 - 1.2 mg/dL 0.6 - -  Alkaline Phos 38 - 126  U/L 58 - -  AST 15 - 41 U/L 16 - -  ALT 0 - 44 U/L 17 - -       DIAGNOSTIC IMAGING:  I have independently reviewed the scans and discussed with the patient.   I have reviewed Francene Finders, NP's note and agree with the documentation.  I personally performed a face-to-face visit, made revisions and my assessment and plan is as follows.    ASSESSMENT & PLAN:   Adenocarcinoma, lung, left ([place]) 1.  Metastatic adenocarcinoma of the left lung with multiple brain mets: -Guardant 360 showing EGFR exon 19 deletion (EGFR [id number]) - Presentation to ER on 04/24/2018 with shortness of breath.  She was never smoker. -CT PE protocol showed large pulmonary embolus in the descending interlobar pulmonary artery with occlusion of the right lower lobe pulmonary artery and additional small clots noted in the right middle lobe and right upper lobes with associated right pleural effusion and right lower lobe infarct.  It also showed incidental left lower lobe mass measuring 3.8 x 3.7 x 2.1 cm associated with hilar and mediastinal adenopathy. - PET/CT scan on 05/14/2018 shows hypermetabolic left lower lobe lung mass measuring 3.7 cm, SUV of 11.  Bilateral hilar adenopathy and subcarinal adenopathy.  Both adrenal glands appear thickened and irregular with increased SUV.  Right lower lobe airspace disease has hypermetabolic rim and a hypermetabolic nodule medial to the main airspace disease.  This may represent evolving infection/inflammation. - MRI of the brain on 05/30/2018 shows 20 subcentimeter enhancing brain lesions consistent with  metastasis, no edema. - Left lower lobe lung biopsy on 06/18/2018 consistent with adenocarcinoma.  There was not enough tissue to run molecular testing. - Biopsy of the right supraclavicular lymph node on 07/11/2018 was consistent with metastatic adenocarcinoma. -She was started on osimertinib 80 mg daily on 07/28/2018. -She had couple of episodes of nausea.  She is not taking in  enough nutrition. -Her baseline EKG showed QTC of 472 ms.  EKG today showed QTC of 508 ms.  Magnesium and potassium were within normal limits. - She had developed renal insufficiency with a creatinine of 1.7.  Osimertinib does not cause renal insufficiency.  However this could be related to her medications and dehydration.  We will schedule her for 1 L of fluids.  I have told her to discontinue all her blood pressure medications including Norvasc, chlorthalidone and lisinopril. -We will bring her back in 1 week to repeat EKG and blood work.   2.  Recurrent pulmonary embolism: -This is likely secondary to active malignancy.  CT PE protocol on 04/24/2018 shows PE in the right lower lobe pulmonary artery.  She was started on Eliquis. -CT of the chest with contrast on 07/10/2018 shows bilateral pulmonary emboli, on the left lung new since diagnosis. -She was subsequently switched to Lovenox injection once daily.   3.  Malnutrition: -She cannot swallow any solid foods at this time.  She is able to eat soups and mashed potatoes. - She will follow the recommendations of Ovid Curd for calorie needs. -I think her extensive lymphadenopathy is pressing on the esophagus.      Orders placed this encounter:  Orders Placed This Encounter  Procedures  . EKG 12-Lead      Derek Jack, MD [place] [phone]

## 2018-08-05 NOTE — Assessment & Plan Note (Addendum)
1.  Metastatic adenocarcinoma of the left lung with multiple brain mets: -Guardant 360 showing EGFR exon 19 deletion (EGFR [id number]) - Presentation to ER on 04/24/2018 with shortness of breath.  She was never smoker. -CT PE protocol showed large pulmonary embolus in the descending interlobar pulmonary artery with occlusion of the right lower lobe pulmonary artery and additional small clots noted in the right middle lobe and right upper lobes with associated right pleural effusion and right lower lobe infarct.  It also showed incidental left lower lobe mass measuring 3.8 x 3.7 x 2.1 cm associated with hilar and mediastinal adenopathy. - PET/CT scan on 05/14/2018 shows hypermetabolic left lower lobe lung mass measuring 3.7 cm, SUV of 11.  Bilateral hilar adenopathy and subcarinal adenopathy.  Both adrenal glands appear thickened and irregular with increased SUV.  Right lower lobe airspace disease has hypermetabolic rim and a hypermetabolic nodule medial to the main airspace disease.  This may represent evolving infection/inflammation. - MRI of the brain on 05/30/2018 shows 20 subcentimeter enhancing brain lesions consistent with metastasis, no edema. - Left lower lobe lung biopsy on 06/18/2018 consistent with adenocarcinoma.  There was not enough tissue to run molecular testing. - Biopsy of the right supraclavicular lymph node on 07/11/2018 was consistent with metastatic adenocarcinoma. -She was started on osimertinib 80 mg daily on 07/28/2018. -She had couple of episodes of nausea.  She is not taking in enough nutrition. -Her baseline EKG showed QTC of 472 ms.  EKG today showed QTC of 508 ms.  Magnesium and potassium were within normal limits. - She had developed renal insufficiency with a creatinine of 1.7.  Osimertinib does not cause renal insufficiency.  However this could be related to her medications and dehydration.  We will schedule her for 1 L of fluids.  I have told her to discontinue all her blood  pressure medications including Norvasc, chlorthalidone and lisinopril. -We will bring her back in 1 week to repeat EKG and blood work.   2.  Recurrent pulmonary embolism: -This is likely secondary to active malignancy.  CT PE protocol on 04/24/2018 shows PE in the right lower lobe pulmonary artery.  She was started on Eliquis. -CT of the chest with contrast on 07/10/2018 shows bilateral pulmonary emboli, on the left lung new since diagnosis. -She was subsequently switched to Lovenox injection once daily.   3.  Malnutrition: -She cannot swallow any solid foods at this time.  She is able to eat soups and mashed potatoes. - She will follow the recommendations of Ovid Curd for calorie needs. -I think her extensive lymphadenopathy is pressing on the esophagus.

## 2018-08-05 NOTE — Patient Instructions (Signed)
Metastatic adenocarcinoma of the lung  I think you are doing quite well at present  She needs to stay very active  Continue with blood thinners  Call with significant shortness of breath/chest pain or any other concerning symptoms  I will see you back in the office in about 3 months

## 2018-08-05 NOTE — Progress Notes (Signed)
Radiation Oncology         [phone]) [phone] ________________________________  Name: Kaitlin [name] MRN: [id number]  Date: 07/29/2018  DOB: 1951-03-18  End of Treatment Note  Diagnosis:   68 y.o. female with newly diagnosed metastatic NSCLC of the left lower lobe lung with significant mediastinal/hilar lymph node involvement and at least 20 subcentimeter brain metastases.  Indication for treatment:  Palliative       Radiation treatment dates:   07/16/2018 - 07/29/2018  Site/dose:    1. The left lung was treated to 30 Gy in 10 fractions of 3 Gy. 2. The whole brain was treated to 30 Gy in 10 fractions of 3 Gy.  Beams/energy:    1. 3D / 15X, 6X Photon 2. Isodose Plan / 6X Photon  Narrative: The patient tolerated radiation treatment relatively well.  She experienced intermittent nausea and vomiting, despite Compazine. She also developed intermittent difficulty with swallowing. She denied any skin changes within the treatment fields. No alopecia was noted.   Plan: The patient has completed radiation treatment. The patient will return to radiation oncology clinic for routine followup in one month. I advised her to call or return sooner if she has any questions or concerns related to her recovery or treatment. ________________________________  Artist Pais. Kathrynn Running, M.D.  This document serves as a record of services personally performed by Margaretmary Dys, MD. It was created on his behalf by Ivar Bury, a trained medical scribe. The creation of this record is based on the scribe's personal observations and the provider's statements to them. This document has been checked and approved by the attending provider.

## 2018-08-05 NOTE — Progress Notes (Signed)
Nutrition Assessment  Reason for Assessment: MST+  ASSESSMENT:  68 y/o female w/ PMHx HTN, DM2. She presented to ED on Thanksgiving w/ acute onset SOB. She was subsequently found to have a PE and pulmonary mass. PET 12/18 findings consistent w/ metastatic disease. First seen at [facility] on 12/23. MRI brain  1/3 showed ~20 brain metastases. Lung Biopsy 1/22 + for adenocarcinoma.  Begun on oral chemo 3/2  Pt seen with an emesis bag this morning. She says she has not thrown up today, but she is in the habit of bringing it "wherever I go".   Seen today with 2 other family members.  Her weight loss is immediately visible. Her BP is low. She reports suffering from several different ailments and these have all limited her intake. Primarily, she is struggling with early satiety. She says she will get hungry, but when she goes to eat, she is full after only 1-2 bites. Additionally, she has intermittent nausea and vomiting. She says this is particularly worse with solid foods. As such, she largely has been subsisting on liquids (Gatorade, water, whole milk, gingerale, cold coffee) and creamy consistencies (cream potatoes/gravy). Finally, she reports being drained of energy, but this has been a chronic issue-r/t WBRT?- this was done during the interim. Completed 3/3   Diet recall: 3/9 Lunch: Mashed potatoes, gravy, hot wings 3/9 Dinner: Mashed potatoes, gravy, 1 hotwing (leftovers), +gatorade 3/10 Breakfast: Glass of milk.   Supplement-wise, she said she tried the Ensure RD had provided her, but stopped because she always threw up a few hours after having one. She also recently started taking some sort of complete MVI one of their friends/family members recommended.   She says her nausea is not helped by the compazine; even if she takes this preemptively, she will still get nauseated.   She has lost a tremendous amount of weight in just the past 3 weeks, going from 185.1 on 2/18 to 170.3 today. This loss of  8.1% BW well meets malnutrition criteria. Long term, she was 199 lbs when seen in ED on Thanksgiving. This equates to a loss of 14.5% bw in just over 3 months.   Wt Readings from Last 10 Encounters:  08/05/18 170 lb 4.8 oz (77.2 kg)  07/15/18 185 lb 3 oz (84 kg)  07/06/18 189 lb 13.1 oz (86.1 kg)  07/03/18 188 lb 3.2 oz (85.4 kg)  06/26/18 186 lb 1 oz (84.4 kg)  06/16/18 181 lb 14.1 oz (82.5 kg)  05/27/18 182 lb (82.6 kg)  05/19/18 185 lb 4.8 oz (84.1 kg)  05/05/18 193 lb (87.5 kg)  04/24/18 198 lb 6.6 oz (90 kg)   MEDICATIONS: Chemo: Tagrisso  Supportive medications: Compazine, Deltasone, ppi, ativan,  Other: Chlorthalidone  LABS: No new labs since her last visit  ANTHROPOMETRICS: Height:  Ht Readings from Last 1 Encounters:  07/06/18 _0  (1.676 m)   Weight:  Wt Readings from Last 1 Encounters:  08/05/18 170 lb 4.8 oz (77.2 kg)   BMI:  BMI Readings from Last 1 Encounters:  08/05/18 27.49 kg/m   UBW: 213 lbs (per pt report) Wt changes:  -15 lbs (8.1% bw) x3 weeks -29 lbs (14.5% bw) x3.5 months   ESTIMATED ENERGY NEEDS:  Kcal: 1950-2150 (25-28 kcal/kg bw) Protein: 83-95g Pro (1.4-1.6g/kg ibw) Fluid: 1.9-2.1 L fluid (1 ml/kcal)  NUTRITION - FOCUSED PHYSICAL EXAM: Deferred   NUTRITION DIAGNOSIS:  Inadequate oral intake r/t early satiety, nausea and fatigue AEB loss of 15 lbs (>8%) bw in just  3 weeks.   DOCUMENTATION CODES:  Not applicable  INTERVENTION:   Her drastic weight loss sounds to mostly related to early satiety and postprandial nausea, the latter of which has led her to following a liquid diet. Unfortunately, her nausea does not sound to be controlled by her compazine and she is no longer taking this preemptively. She does not feel it helps at all. RD provided patient a handout on how to manage nausea/vomiting with her food choices. Being she is restricted to liquids, RD recommended creamy soups, which are higher in kcals. Hopefully she will be able to  tolerate the high fat content of these.   Regarding her early satiety, RD reinforced the need to eat every few hours. She also needs to keep making the most of each bite by choosing calorie/protein dense foods. Emphasized importance of drinking kcal/containing beverages- like her whole milk. Her Gatorade, cold coffee and soup broth is providing her little nutrition. RD recommended adding whey protein powder to her beverages, a quick, simple way to increase her intake.   It is not surprising the Ensure is causing increased nausea, given its high fat content. RD recommended trying a clear supplement. RD provided several types of clear oral supplements as well as coupons.   Given her degree of loss, will schedule a quicker follow up.   GOAL:  Oral intake to meet >90% of needs, wt stability  NOT MET  MONITOR:  Chemotherapy tolerance, labs, weights, nausea, oral intake, like/dislike of clear oral supplements   Next Visit: 3/20- before office visit  Burtis Junes RD, LDN, [facility] Available Tues-Sat via Pager: [phone] 08/05/2018 12:38 PM

## 2018-08-05 NOTE — Patient Instructions (Signed)
[facility] Discharge Instructions     Thank you for choosing  Can[facility] Center at [facility] to provide your oncology and hematology care.  To afford each patient quality time with our provider, please arrive at least 15 minutes before your scheduled appointment time.   If you have a lab appointment with the Cancer Center please come in thru the  Main Entrance and check in at the main information desk  You need to re-schedule your appointment should you arrive 10 or more minutes late.  We strive to give you quality time with our providers, and arriving late affects you and other patients whose appointments are after yours.  Also, if you no show three or more times for appointments you may be dismissed from the clinic at the providers discretion.     Again, thank you for choosing  Cancer Center.  Our hope is that these requests will decrease the amount of time that you wait before being seen by our physicians.       _____________________________________________________________  Should you have questions after your visit to  Cancer Center, please contact our office at [phone] between the hours of 8:00 a.m. and 4:30 p.m.  Voicemails left after 4:00 p.m. will not be returned until the following business day.  For prescription refill requests, have your pharmacy contact our office and allow 72 hours.    Cancer Center Support Programs:   > Cancer Support Group  2nd Tuesday of the month 1pm-2pm, Journey Room    

## 2018-08-05 NOTE — Progress Notes (Signed)
@Patient  ID: Kaitlin [name], female    DOB: [birth year], 68 y.o.   MRN: [id number]  CC: Follow-up for pulmonary embolism, adenocarcinoma of the lung  Referring provider: [name], MD  HPI:  Diagnosed with adenocarcinoma of the lung Recently had biopsy of neck node showing metastasis Following up with oncology on a regular basis  Switch to Lovenox and also biologic agent for cancer  She is doing well at present Able to ambulate The only difficulty she has is swallowing-not able to eat solids  68 year old female, never smoked. PMH significant for asthma]. Recent hospital admission from 11/28-12/3 for new PE and left lower lung mass. Did have a recent PET scan revealing hypermetabolic mass, hypoglycemic metabolic adenopathy and activity within the adrenals Feels generally well at present  Hospital course: Patient originally presented with cough and worsening dyspnea for 3 days Associated tachycardia . Hemoptysis observed in ED. CTA of chest showed large right sided PE with possible RLL infarct vs infiltrate as well as left lower lobe 3.8 x 3.7 x 2.1 cm mass with hilar and mediastinal adenopathy. No history of malignancy. Initally admitted to ICU where she was started on IV heparin and received supportive treatment with oxygen. Treated with azithromycin course. Weaned to room air and was transitioned to Eliquis. Plan is for patient to be on anticoagulation for 3 week and then needs CT guided biopsy of left lower lung mass.   08/05/2018 Patient presents today for hospital follow-up. Accompanied by family friend/care taker. Breathing better since hospitalization.  Breathing continues to improve Still has occasional cough No fever, occasional chills  Feels relatively well  Questions regarding biopsy discussed.    Allergies  Allergen Reactions  . Banana Anaphylaxis  . Aspirin Other (See Comments)    G.I. Upset     Immunization History  Administered Date(s) Administered  .  Influenza, High Dose Seasonal PF 06/06/2018    Past Medical History:  Diagnosis Date  . Asthma   . Carpal tunnel syndrome   . Cataract    left eye  . Diabetes mellitus without complication (HCC)   . DVT (deep venous thrombosis) (HCC)   . Hypertension   . Pulmonary embolism (HCC) 03/2018    Tobacco History: Social History   Tobacco Use  Smoking Status Never Smoker  Smokeless Tobacco Never Used   Counseling given: Not Answered   Outpatient Medications Prior to Visit  Medication Sig Dispense Refill  . albuterol (PROVENTIL HFA;VENTOLIN HFA) 108 (90 BASE) MCG/ACT inhaler Inhale 2 puffs into the lungs every 6 (six) hours as needed for wheezing.    Marland Kitchen albuterol (PROVENTIL) (2.5 MG/3ML) 0.083% nebulizer solution Take 3 mLs (2.5 mg total) by nebulization every 6 (six) hours as needed for wheezing or shortness of breath. 75 mL 2  . amLODipine (NORVASC) 5 MG tablet Take 5 mg by mouth daily.    . Blood Glucose Monitoring Suppl (TRUE METRIX METER) DEVI 1 Device by Does not apply route daily. 1 Device 0  . chlorthalidone (HYGROTON) 25 MG tablet Take 25 mg by mouth daily.    Marland Kitchen LORazepam (ATIVAN) 0.5 MG tablet Take 0.5 mg by mouth at bedtime as needed for anxiety.    Marland Kitchen losartan (COZAAR) 100 MG tablet Take 1 tablet (100 mg total) by mouth daily. 30 tablet 0  . osimertinib mesylate (TAGRISSO) 80 MG tablet Take 1 tablet (80 mg total) by mouth daily. 30 tablet 1  . pantoprazole (PROTONIX) 40 MG tablet Take 1 tablet (40 mg total)  by mouth daily. 30 tablet 2  . predniSONE (DELTASONE) 10 MG tablet Take 10 mg by mouth daily with breakfast.    . prochlorperazine (COMPAZINE) 10 MG tablet Take 1 tablet (10 mg total) by mouth every 6 (six) hours as needed for nausea or vomiting. 30 tablet 3  . TRUE METRIX BLOOD GLUCOSE TEST test strip     . TRUEplus Lancets 30G MISC      No facility-administered medications prior to visit.     Review of Systems  Review of Systems  Constitutional: Negative for fever  and unexpected weight change.  Eyes: Negative.   Respiratory: Positive for cough. Negative for shortness of breath and wheezing.   Cardiovascular: Negative.   Gastrointestinal: Negative for abdominal pain, blood in stool, nausea and vomiting.  Endocrine: Negative.     Physical Exam  Ht 5\' 6"  (1.676 m)   Wt 174 lb (78.9 kg)   BMI 28.08 kg/m  Physical Exam Constitutional:      General: She is not in acute distress.    Appearance: Normal appearance. She is well-developed.     Comments: Appears chronically ill   HENT:     Head: Normocephalic and atraumatic.     Nose: No congestion.  Eyes:     Extraocular Movements: Extraocular movements intact.     Pupils: Pupils are equal, round, and reactive to light.  Neck:     Musculoskeletal: Normal range of motion. No neck rigidity or muscular tenderness.  Cardiovascular:     Rate and Rhythm: Regular rhythm. Tachycardia present.  Pulmonary:     Effort: Pulmonary effort is normal. No respiratory distress.     Breath sounds: Normal breath sounds. No wheezing or rales.  Lymphadenopathy:     Cervical: No cervical adenopathy.  Neurological:     Mental Status: She is alert.     Lab Results:  CBC    Component Value Date/Time   WBC 5.2 08/05/2018 1245   RBC 5.21 (H) 08/05/2018 1245   HGB 13.3 08/05/2018 1245   HCT 42.4 08/05/2018 1245   PLT 201 08/05/2018 1245   MCV 81.4 08/05/2018 1245   MCH 25.5 (L) 08/05/2018 1245   MCHC 31.4 08/05/2018 1245   RDW 16.0 (H) 08/05/2018 1245   LYMPHSABS 0.9 08/05/2018 1245   MONOABS 0.6 08/05/2018 1245   EOSABS 0.1 08/05/2018 1245   BASOSABS 0.0 08/05/2018 1245    BMET    Component Value Date/Time   NA 136 08/05/2018 1245   K 3.8 08/05/2018 1245   CL 101 08/05/2018 1245   CO2 26 08/05/2018 1245   GLUCOSE 132 (H) 08/05/2018 1245   BUN 44 (H) 08/05/2018 1245   CREATININE 1.71 (H) 08/05/2018 1245   CALCIUM 10.0 08/05/2018 1245   GFRNONAA 30 (L) 08/05/2018 1245   GFRAA 35 (L) 08/05/2018  1245    Imaging: Ct Chest W Contrast  Result Date: 07/10/2018 CLINICAL DATA:  68 year old with biopsy-proven adenocarcinoma in the left lower lobe. Patient has been recently admitted for sepsis and pneumonia. Patient also history of pulmonary embolism and on anticoagulation. Plan for follow-up chest CT to see if the patient would be a candidate for repeat left lung biopsy because additional tissue is needed for molecular testing. EXAM: CT CHEST WITH CONTRAST TECHNIQUE: Multidetector CT imaging of the chest was performed during intravenous contrast administration. CONTRAST:  75mL OMNIPAQUE IOHEXOL 300 MG/ML  SOLN COMPARISON:  CT 07/07/2018 FINDINGS: Cardiovascular: Again noted is a large pericardial effusion which is similar in size to  the study on 07/07/2018. Patient has bilateral pulmonary emboli. There is emboli in the distal right pulmonary artery extending into the right upper lobe and right lower lobe branches. There is also emboli in the left lower lobe and lingular branches. The left pulmonary emboli are new since 04/24/2018. Normal caliber of the thoracic aorta. The great vessels are patent. Mediastinum/Nodes: There appears to be marked thickening or enlargement of the esophagus and this is similar to the prior CTs. Again noted is diffuse soft tissue or edema in the mediastinum. Concern for bilateral hilar soft tissue enlargement or lymphadenopathy. There is also increased edema and possible lymphadenopathy in the supraclavicular regions, right side greater than left. Lungs/Pleura: Left pleural effusion has slightly enlarged from the recent comparison examination on 07/07/2018. The lesion in the posterior left lower lobe is poorly characterized due to the adjacent consolidation in left lower lobe. Aeration in the left lower lobe has minimally changed from the recent comparison examination. Persistent sub solid densities along the right lung apex have minimally changed. Again noted is indeterminate  nodularity along the right major fissure on sequence 5, image 53 measuring 1.1 x 0.6 cm. Patchy peripheral densities in the right lower lobe have minimally changed from the recent comparison examination. Again noted is a small irregular right pleural effusion. Upper Abdomen: Again noted is thickening and mild stranding around the bilateral adrenal glands. Images of the upper liver are unremarkable. Musculoskeletal: No acute bone abnormality. IMPRESSION: 1. Bilateral pulmonary emboli. Pulmonary emboli in the left lung is new since 04/24/2018 and difficult to exclude acute thrombus in the right lung. 2. Persistent densities and consolidation in the left lower lung. Findings are compatible with combination of the known left lung lesion, compressive atelectasis and possibly pneumonia. Left pleural effusion has slightly enlarged in size. 3. Persistent pleuroparenchymal disease in the right lower lobe that is minimally changed since 07/07/2017 and probably associated with previous large pulmonary embolism in this area. 4. Large pericardial effusion have not significantly changed since the recent comparison examination. 5. Extensive soft tissue throughout the mediastinum and bilateral hilar regions is concerning for metastatic disease based on the previous PET-CT. There also appears to be diffuse thickening of the esophagus. In addition, there is abnormal soft tissue in the supraclavicular regions and cannot exclude metastatic lymphadenopathy. Recommend further characterization of the supraclavicular lymphadenopathy with ultrasound to see if this would be an adequate site for additional tissue sampling. 6. Persistent thickening of the adrenal glands which is also concerning for metastatic disease. These results were called by telephone at the time of interpretation on 07/10/2018 at 4:45 pm to Dr. Jerral Ralph, who verbally acknowledged these results. Electronically Signed   By: Richarda Overlie M.D.   On: 07/10/2018 16:50   Ct Chest  W Contrast  Result Date: 07/07/2018 CLINICAL DATA:  Chest pain.  Short of breath. EXAM: CT CHEST WITH CONTRAST TECHNIQUE: Multidetector CT imaging of the chest was performed during intravenous contrast administration. CONTRAST:  OMNIPAQUE IOHEXOL 300 MG/ML  SOLN COMPARISON:  Chest radiograph, 07/06/2018.  Chest CT, 04/24/2018. FINDINGS: Cardiovascular: Heart is normal in size and configuration. There is a moderate to large pericardial effusion with Hounsfield units averaging between 14 and 27. Great vessels are normal in caliber. No aortic dissection or atherosclerosis. Arch branch vessels are widely patent. Mediastinum/Nodes: No neck base or axillary masses. No axillary adenopathy. There is increased attenuation of the fat throughout the mediastinum with pericardial fluid extending into the superior pericardial recesses. Esophagus is distended with evidence  of wall thickening. Esophagus is not well-defined. Previously noted mediastinal and hilar adenopathy is less well-defined but grossly unchanged. Trachea is unremarkable. Lungs/Pleura: Right lower lobe airspace consolidation noted on the prior CT has mostly resolved. There are small bilateral pleural effusions, larger on the left. There is dependent opacity in the lower lobes, left upper lobe lingula and right middle lobe, which may all be due to atelectasis. Atelectasis and a component of infection is possible. The posterior left lower lobe mass is not as well-defined now with contiguous lower lobe opacity. No new lung masses. No discrete nodules. Bronchi to the lower lobes, right middle lobe and left upper lobe lingula are narrowed, somewhat greater than on the prior CT. No pneumothorax. Upper Abdomen: Adrenal gland thickening or masses, similar to the prior CT. No acute findings in the visualized upper abdomen. Musculoskeletal: No fracture or acute finding. No osteoblastic or osteolytic lesions. IMPRESSION: 1. Moderate to large pericardial effusion,  increased in size from the prior CT. 2. Mildly dilated, prominent esophagus with evidence of diffuse wall thickening. This is nonspecific. 3. Previously seen right lower lobe consolidation has essentially resolved. Left lower lobe mass is most likely unchanged. It is less well-defined due to contiguous left lower lobe opacity, the latter finding which may reflect atelectasis, infection or a combination. Additional lung base opacities noted bilaterally that are most likely atelectasis. There is narrowing of the right middle lobe, bilateral lower lobe and left upper lobe lingula bronchi as a pass through infrahilar abnormal soft tissue, presumed to be adenopathy. This bronchial narrowing has increased when compared to the prior study. 4. Small bilateral pleural effusions, larger on the left. Right pleural effusion is smaller than on the prior study and left pleural effusion is new. Electronically Signed   By: Amie Portland M.D.   On: 07/07/2018 15:49   US Soft Tissue Head & Neck (non-thyroid)  Result Date: 07/11/2018 CLINICAL DATA:  68 year old female with adenocarcinoma in the left lower lobe. Recent chest CT demonstrates abnormal soft tissue in the supraclavicular regions. Evaluate for supraclavicular lymphadenopathy and site for additional tissue sampling. EXAM: ULTRASOUND OF HEAD/NECK SOFT TISSUES TECHNIQUE: Ultrasound examination of the head and neck soft tissues was performed in the area of clinical concern. COMPARISON:  Chest CT 07/10/2018 FINDINGS: Abnormal heterogeneous soft tissue on both sides of the neck. Tissue on the right side of the neck appears to be edematous but there is concern for underlying abnormal soft tissue or lymphadenopathy lateral to the right internal jugular vein. These findings correspond with the recent CT findings. On the left side, there is more distinct abnormal soft tissue that is concerning for metastatic disease. Irregular hypoechoic lymph node or lesion just lateral to the  left internal jugular vein measuring 1.1 cm in the short axis. There is an irregular hypoechoic collection in the lateral left neck which could represent fluid or necrotic lymph node measuring 1.6 x 1.0 x 1.1 cm. There is another cystic hypoechoic structure along the lateral left neck measuring 1.2 x 0.8 x 1.4 cm. IMPRESSION: Irregular heterogeneous soft tissue on both sides of the lower neck. Findings are most compatible with metastatic lymphadenopathy based on the recent chest CT. There are small cystic or necrotic lymph nodes on the left side of the neck as described. Electronically Signed   By: Richarda Overlie M.D.   On: 07/11/2018 07:50   [place] Core Biopsy (lymph Nodes)  Result Date: 07/11/2018 INDICATION: History of biopsy proven lung adenocarcinoma. Request made for ultrasound-guided biopsy  of infiltrative mass within the base of the right-side of the neck for acquisition of additional tissue for molecular testing. EXAM: ULTRASOUND-GUIDED RIGHT CERVICAL MASS BIOPSY COMPARISON:  Chest CT - 07/10/2018; soft tissue neck ultrasound - 07/10/2018 MEDICATIONS: None ANESTHESIA/SEDATION: Moderate (conscious) sedation was employed during this procedure. A total of Versed 2 mg and Fentanyl 100 mcg was administered intravenously. Moderate Sedation Time: 11 minutes. The patient's level of consciousness and vital signs were monitored continuously by radiology nursing throughout the procedure under my direct supervision. COMPLICATIONS: None immediate. TECHNIQUE: Informed written consent was obtained from the patient after a discussion of the risks, benefits and alternatives to treatment. Questions regarding the procedure were encouraged and answered. Initial ultrasound scanning demonstrated an ill-defined infiltrative mass involving the base of the right-side of the neck just superior to the superior to the supraclavicular fossa, unchanged compared to preceding soft tissue neck ultrasound performed 07/10/2018. an ultrasound  image was saved for documentation purposes. The procedure was planned. A timeout was performed prior to the initiation of the procedure. The operative was prepped and draped in the usual sterile fashion, and a sterile drape was applied covering the operative field. A timeout was performed prior to the initiation of the procedure. Local anesthesia was provided with 1% lidocaine with epinephrine. Under direct ultrasound guidance, an 18 gauge core needle device was utilized to obtain to obtain 5 core needle biopsies of the infiltrative mass within the base of the right-side of the neck. The samples were placed in saline and submitted to pathology. The needle was removed and hemostasis was achieved with manual compression. Post procedure scan was negative for significant hematoma. A dressing was placed. The patient tolerated the procedure well without immediate postprocedural complication. IMPRESSION: Technically successful ultrasound guided biopsy of infiltrative mass within the base of the right-side of the neck. Electronically Signed   By: Simonne Come M.D.   On: 07/11/2018 14:45   Vas [place] Lower Extremity Venous (dvt)  Result Date: 07/13/2018  Lower Venous Study Indications: Pulmonary embolism.  Performing Technologist: Levada Schilling RDMS, RVT  Examination Guidelines: A complete evaluation includes B-mode imaging, spectral Doppler, color Doppler, and power Doppler as needed of all accessible portions of each vessel. Bilateral testing is considered an integral part of a complete examination. Limited examinations for reoccurring indications may be performed as noted.  Right Venous Findings: +---------+---------------+---------+-----------+----------+-------+          CompressibilityPhasicitySpontaneityPropertiesSummary +---------+---------------+---------+-----------+----------+-------+ CFV      Full           Yes      Yes                           +---------+---------------+---------+-----------+----------+-------+ SFJ      Full                                                 +---------+---------------+---------+-----------+----------+-------+ FV Prox  Full                                                 +---------+---------------+---------+-----------+----------+-------+ FV Mid   Full                                                 +---------+---------------+---------+-----------+----------+-------+  FV DistalFull                                                 +---------+---------------+---------+-----------+----------+-------+ PFV      Full                                                 +---------+---------------+---------+-----------+----------+-------+ POP      Full           Yes      Yes                          +---------+---------------+---------+-----------+----------+-------+ PTV      Full                                                 +---------+---------------+---------+-----------+----------+-------+ PERO     Full                                                 +---------+---------------+---------+-----------+----------+-------+ GSV      Full                                                 +---------+---------------+---------+-----------+----------+-------+  Left Venous Findings: +---------+---------------+---------+-----------+----------+------------------+          CompressibilityPhasicitySpontaneityPropertiesSummary            +---------+---------------+---------+-----------+----------+------------------+ CFV      Full           Yes      Yes                                     +---------+---------------+---------+-----------+----------+------------------+ SFJ      Full                                                            +---------+---------------+---------+-----------+----------+------------------+ FV Prox  Full                                                             +---------+---------------+---------+-----------+----------+------------------+ FV Mid   Full                                                            +---------+---------------+---------+-----------+----------+------------------+  FV DistalFull                                                            +---------+---------------+---------+-----------+----------+------------------+ PFV      Full                                                            +---------+---------------+---------+-----------+----------+------------------+ POP      Full           Yes      Yes                  slow flow, no                                                            thrombus           +---------+---------------+---------+-----------+----------+------------------+ PTV      Full                                                            +---------+---------------+---------+-----------+----------+------------------+ PERO     Full                                                            +---------+---------------+---------+-----------+----------+------------------+ GSV      Full                                                            +---------+---------------+---------+-----------+----------+------------------+    Summary: Right: There is no evidence of deep vein thrombosis in the lower extremity. No cystic structure found in the popliteal fossa. Left: There is no evidence of deep vein thrombosis in the lower extremity. No cystic structure found in the popliteal fossa.  *See table(s) above for measurements and observations. Electronically signed by Fabienne Bruns MD on 07/13/2018 at 2:26:23 PM.    Final      Assessment & Plan:  68 year old female, never smoked. No significant past medical history prior to recent hospitalization.   Adenocarcinoma of the lung with metastases -Follows up with oncology on a regular basis -Status post  radiation treatments  Pulmonary embolism -On subcu Lovenox, recent switch based off of recent CT showing new clot  Multilobar pneumonia -Resolving infiltrative process   Plan: Medications to help with cough   Encouraged to stay active  Nothing to change at the  present time  Encouraged to call with any significant concerns  We will see her back in the office in about 3 months   Tomma Lightning, MD 08/05/2018

## 2018-08-06 ENCOUNTER — Inpatient Hospital Stay (HOSPITAL_COMMUNITY): Payer: Medicare HMO

## 2018-08-06 ENCOUNTER — Other Ambulatory Visit (HOSPITAL_COMMUNITY): Payer: Self-pay | Admitting: Nurse Practitioner

## 2018-08-06 ENCOUNTER — Other Ambulatory Visit: Payer: Self-pay

## 2018-08-06 VITALS — BP 109/59 | HR 74 | Temp 97.7°F | Resp 18 | Wt 169.6 lb

## 2018-08-06 DIAGNOSIS — N289 Disorder of kidney and ureter, unspecified: Secondary | ICD-10-CM | POA: Diagnosis not present

## 2018-08-06 DIAGNOSIS — E860 Dehydration: Secondary | ICD-10-CM | POA: Diagnosis not present

## 2018-08-06 DIAGNOSIS — I10 Essential (primary) hypertension: Secondary | ICD-10-CM | POA: Diagnosis not present

## 2018-08-06 DIAGNOSIS — C3492 Malignant neoplasm of unspecified part of left bronchus or lung: Secondary | ICD-10-CM

## 2018-08-06 DIAGNOSIS — C7931 Secondary malignant neoplasm of brain: Secondary | ICD-10-CM | POA: Diagnosis not present

## 2018-08-06 DIAGNOSIS — Z86711 Personal history of pulmonary embolism: Secondary | ICD-10-CM | POA: Diagnosis not present

## 2018-08-06 DIAGNOSIS — E46 Unspecified protein-calorie malnutrition: Secondary | ICD-10-CM | POA: Diagnosis not present

## 2018-08-06 DIAGNOSIS — E119 Type 2 diabetes mellitus without complications: Secondary | ICD-10-CM | POA: Diagnosis not present

## 2018-08-06 DIAGNOSIS — I2699 Other pulmonary embolism without acute cor pulmonale: Secondary | ICD-10-CM | POA: Diagnosis not present

## 2018-08-06 MED ORDER — SODIUM CHLORIDE 0.9 % IV SOLN
INTRAVENOUS | Status: DC
  Administered 2018-08-06: 12:00:00 via INTRAVENOUS

## 2018-08-06 MED ORDER — SODIUM CHLORIDE 0.9 % IV SOLN
Freq: Once | INTRAVENOUS | Status: AC
  Administered 2018-08-06: 12:00:00 via INTRAVENOUS
  Filled 2018-08-06: qty 10

## 2018-08-06 MED ORDER — SODIUM CHLORIDE 0.9 % IV SOLN
Freq: Once | INTRAVENOUS | Status: DC
  Filled 2018-08-06: qty 10

## 2018-08-06 NOTE — Progress Notes (Signed)
Patient tolerated hydration with no complaints voiced.  Peripheral IV site clean and dry with no bruising or swelling noted.  Denied pain or burning at site.  Flushed with NACL before discontinuation of peripheral IV with band aid applied.  VSS with discharge and left by wheelchair with family with no s/s of distress noted.

## 2018-08-06 NOTE — Patient Instructions (Signed)
[facility]  Discharge Instructions:   _______________________________________________________________  Thank you for choosing [facility] at [facility] to provide your oncology and hematology care.  To afford each patient quality time with our providers, please arrive at least 15 minutes before your scheduled appointment.  You need to re-schedule your appointment if you arrive 10 or more minutes late.  We strive to give you quality time with our providers, and arriving late affects you and other patients whose appointments are after yours.  Also, if you no show three or more times for appointments you may be dismissed from the clinic.  Again, thank you for choosing [facility] at [facility]. Our hope is that these requests will allow you access to exceptional care and in a timely manner. _______________________________________________________________  If you have questions after your visit, please contact our office at [phone] between the hours of 8:30 a.m. and 5:00 p.m. Voicemails left after 4:30 p.m. will not be returned until the following business day. _______________________________________________________________  For prescription refill requests, have your pharmacy contact our office. _______________________________________________________________  Recommendations made by the consultant and any test results will be sent to your referring physician. _______________________________________________________________ 

## 2018-08-11 ENCOUNTER — Other Ambulatory Visit (HOSPITAL_COMMUNITY): Payer: Self-pay

## 2018-08-11 DIAGNOSIS — C3492 Malignant neoplasm of unspecified part of left bronchus or lung: Secondary | ICD-10-CM

## 2018-08-15 ENCOUNTER — Inpatient Hospital Stay (HOSPITAL_BASED_OUTPATIENT_CLINIC_OR_DEPARTMENT_OTHER): Payer: Medicare HMO | Admitting: Hematology

## 2018-08-15 ENCOUNTER — Other Ambulatory Visit: Payer: Self-pay

## 2018-08-15 ENCOUNTER — Encounter (HOSPITAL_COMMUNITY): Payer: Self-pay | Admitting: Hematology

## 2018-08-15 ENCOUNTER — Inpatient Hospital Stay (HOSPITAL_COMMUNITY): Payer: Medicare HMO

## 2018-08-15 ENCOUNTER — Inpatient Hospital Stay (HOSPITAL_COMMUNITY): Payer: Medicare HMO | Attending: Hematology

## 2018-08-15 VITALS — BP 108/75 | HR 94 | Temp 98.6°F | Resp 20 | Wt 168.0 lb

## 2018-08-15 DIAGNOSIS — N289 Disorder of kidney and ureter, unspecified: Secondary | ICD-10-CM

## 2018-08-15 DIAGNOSIS — E119 Type 2 diabetes mellitus without complications: Secondary | ICD-10-CM | POA: Diagnosis not present

## 2018-08-15 DIAGNOSIS — I2699 Other pulmonary embolism without acute cor pulmonale: Secondary | ICD-10-CM | POA: Diagnosis not present

## 2018-08-15 DIAGNOSIS — Z79899 Other long term (current) drug therapy: Secondary | ICD-10-CM

## 2018-08-15 DIAGNOSIS — I10 Essential (primary) hypertension: Secondary | ICD-10-CM | POA: Diagnosis not present

## 2018-08-15 DIAGNOSIS — E860 Dehydration: Secondary | ICD-10-CM | POA: Diagnosis not present

## 2018-08-15 DIAGNOSIS — C3492 Malignant neoplasm of unspecified part of left bronchus or lung: Secondary | ICD-10-CM

## 2018-08-15 DIAGNOSIS — R110 Nausea: Secondary | ICD-10-CM

## 2018-08-15 DIAGNOSIS — C7931 Secondary malignant neoplasm of brain: Secondary | ICD-10-CM

## 2018-08-15 DIAGNOSIS — Z86718 Personal history of other venous thrombosis and embolism: Secondary | ICD-10-CM

## 2018-08-15 DIAGNOSIS — Z86711 Personal history of pulmonary embolism: Secondary | ICD-10-CM | POA: Diagnosis not present

## 2018-08-15 DIAGNOSIS — E46 Unspecified protein-calorie malnutrition: Secondary | ICD-10-CM | POA: Diagnosis not present

## 2018-08-15 DIAGNOSIS — Z7901 Long term (current) use of anticoagulants: Secondary | ICD-10-CM

## 2018-08-15 LAB — CBC WITH DIFFERENTIAL/PLATELET
Abs Immature Granulocytes: 0.01 10*3/uL (ref 0.00–0.07)
Basophils Absolute: 0 10*3/uL (ref 0.0–0.1)
Basophils Relative: 1 %
EOS PCT: 3 %
Eosinophils Absolute: 0.1 10*3/uL (ref 0.0–0.5)
HCT: 40.1 % (ref 36.0–46.0)
Hemoglobin: 12.8 g/dL (ref 12.0–15.0)
Immature Granulocytes: 0 %
Lymphocytes Relative: 39 %
Lymphs Abs: 1.2 10*3/uL (ref 0.7–4.0)
MCH: 26 pg (ref 26.0–34.0)
MCHC: 31.9 g/dL (ref 30.0–36.0)
MCV: 81.3 fL (ref 80.0–100.0)
Monocytes Absolute: 0.4 10*3/uL (ref 0.1–1.0)
Monocytes Relative: 13 %
NRBC: 0 % (ref 0.0–0.2)
Neutro Abs: 1.4 10*3/uL — ABNORMAL LOW (ref 1.7–7.7)
Neutrophils Relative %: 44 %
Platelets: 158 10*3/uL (ref 150–400)
RBC: 4.93 MIL/uL (ref 3.87–5.11)
RDW: 16.2 % — ABNORMAL HIGH (ref 11.5–15.5)
WBC: 3.1 10*3/uL — ABNORMAL LOW (ref 4.0–10.5)

## 2018-08-15 LAB — COMPREHENSIVE METABOLIC PANEL
ALK PHOS: 55 U/L (ref 38–126)
ALT: 20 U/L (ref 0–44)
ANION GAP: 8 (ref 5–15)
AST: 19 U/L (ref 15–41)
Albumin: 3.5 g/dL (ref 3.5–5.0)
BUN: 12 mg/dL (ref 8–23)
CO2: 25 mmol/L (ref 22–32)
Calcium: 9.3 mg/dL (ref 8.9–10.3)
Chloride: 102 mmol/L (ref 98–111)
Creatinine, Ser: 1.22 mg/dL — ABNORMAL HIGH (ref 0.44–1.00)
GFR calc Af Amer: 53 mL/min — ABNORMAL LOW (ref >60)
GFR calc non Af Amer: 46 mL/min — ABNORMAL LOW (ref >60)
Glucose, Bld: 101 mg/dL — ABNORMAL HIGH (ref 70–99)
Potassium: 3.4 mmol/L — ABNORMAL LOW (ref 3.5–5.1)
SODIUM: 135 mmol/L (ref 135–145)
Total Bilirubin: 0.4 mg/dL (ref 0.3–1.2)
Total Protein: 7.1 g/dL (ref 6.5–8.1)

## 2018-08-15 LAB — MAGNESIUM: Magnesium: 1.5 mg/dL — ABNORMAL LOW (ref 1.7–2.4)

## 2018-08-15 MED ORDER — SCOPOLAMINE 1 MG/3DAYS TD PT72: 1.0000 | MEDICATED_PATCH | TRANSDERMAL | 1 refills | Status: DC

## 2018-08-15 MED ORDER — SCOPOLAMINE 1 MG/3DAYS TD PT72
1.0000 | MEDICATED_PATCH | TRANSDERMAL | Status: DC
  Administered 2018-08-15: 1.5 mg via TRANSDERMAL
  Filled 2018-08-15: qty 1

## 2018-08-15 NOTE — Assessment & Plan Note (Signed)
1.  Metastatic adenocarcinoma of the left lung with multiple brain mets: -Guardant 360 showing EGFR exon 19 deletion (EGFR [id number]) - Presentation to ER on 04/24/2018 with shortness of breath.  She was never smoker. -CT PE protocol showed large pulmonary embolus in the descending interlobar pulmonary artery with occlusion of the right lower lobe pulmonary artery and additional small clots noted in the right middle lobe and right upper lobes with associated right pleural effusion and right lower lobe infarct.  It also showed incidental left lower lobe mass measuring 3.8 x 3.7 x 2.1 cm associated with hilar and mediastinal adenopathy. - PET/CT scan on 05/14/2018 shows hypermetabolic left lower lobe lung mass measuring 3.7 cm, SUV of 11.  Bilateral hilar adenopathy and subcarinal adenopathy.  Both adrenal glands appear thickened and irregular with increased SUV.  Right lower lobe airspace disease has hypermetabolic rim and a hypermetabolic nodule medial to the main airspace disease.  This may represent evolving infection/inflammation. - MRI of the brain on 05/30/2018 shows 20 subcentimeter enhancing brain lesions consistent with metastasis, no edema. - Left lower lobe lung biopsy on 06/18/2018 consistent with adenocarcinoma.  There was not enough tissue to run molecular testing. - Biopsy of the right supraclavicular lymph node on 07/11/2018 was consistent with metastatic adenocarcinoma. -Osimertinib 80 mg daily started on 07/28/2018.  She is not missing any doses.   -She is having nausea which is poorly controlled.  She is taking Compazine without much help. -We have repeated EKG today.  QTC is 441 ms.  - I have reviewed her blood work.  White count is 3.1 with ANC of 1400.  Magnesium is slightly on the lower side.  Creatinine is 1.2. - I have encouraged her to increase her nutrition.  She lost about 2 pounds from last visit. - She will come back in 7 to 10 days for follow-up with me.   2.  Recurrent  pulmonary embolism: -This is likely secondary to active malignancy.  CT PE protocol on 04/24/2018 shows PE in the right lower lobe pulmonary artery.  She was started on Eliquis. -CT of the chest with contrast on 07/10/2018 shows bilateral pulmonary emboli, on the left lung new since diagnosis. -She was subsequently switched to Lovenox injection once daily.   3.  Weight loss: -She cannot swallow any solid foods at this time. -She is drinking some Ensure.  She does report a lot of nausea.  She is using Compazine without help. -We will start her on scopolamine patch.  If this does not help, we will start her on Zofran. -She will see our nutritionist for recommendations today.

## 2018-08-15 NOTE — Progress Notes (Signed)
Nutrition Follow-up:  Patient with metastatic adenocarcinoma of the left lung with multiple brain mets. Patient receiving oral chemo which started on 3/2.  Completed whole brain radiation on 3/3.    Met with patient and grand-daughter today in clinic.  Patient carrying emesis bag.  Reports "everything I eat comes back up, even water."  Reports yesterday and today has been able to drink whole milk and take medications without vomiting.  Reports yesterday tried to eat cooked greens as was unsuccessful.  Reports took 6 Tbsp of broth from chicken and dumplings soup and it stayed down.  Was able to get 1 boost down over the course of all day yesterday.  Reports juice stays down "somewhat".  Able to drink fruit punch gatorade about 2-3 of small bottles over the course of the day.  Reports that she is taking nausea medication. Tried ensure clear and premier protein and other shakes without success, "I can't keep them down."      Medications: reviewed  Labs: K 3.4, glucose 101, creatinine 1.22, Mag 1.5  Anthropometrics:   Weight 168 lb today decreased from 170 lb on 3/10 (RD note).    17 lb weight loss since 07/15/2018  UBW 213 lb (per pt)   NUTRITION DIAGNOSIS: Inadequate oral intake continues   INTERVENTION:  Patient is not meeting nutritional needs orally and has not been for > 4 weeks. If aggressive nutrition therapy is wanted patient may benefit from feeding tube placement.  ? Extensive lymphadenopathy pressing on esophagus effecting vomiting and ability to get food down.  Discussed with Dr. Raliegh Ip. Reviewed with patient small frequent meals Encouraged taking nausea medication Patient has tried multiple oral nutrition supplements without success, tolerating only 1 boost shake per day.   Discussed pureeing of foods to soupy consistency.  Patient did not think that would help.  "I can't even get applesauce down."   RD without further recommendations at this time.   MONITORING, EVALUATION, GOAL:  chemotherapy tolerance, labs, weight, nausea, oral intake, like/dislike of clear oral supplements   NEXT VISIT: to be determined  Kaitlin [name] B. Zenia Resides, [place], [place] Registered Dietitian [phone] (pager)

## 2018-08-15 NOTE — Progress Notes (Signed)
Kaitlin [name], Kaitlin [name] [id number]   CLINIC:  Medical Oncology/Hematology  PCP:  [name], [place] [address] [phone]   REASON FOR VISIT:  Follow-up for adenocarcinoma of the lung  CURRENT THERAPY:Tegrisso   BRIEF ONCOLOGIC HISTORY:   No history exists.     CANCER STAGING: Cancer Staging No matching staging information was found for the patient.   INTERVAL HISTORY:  Kaitlin [name] 68 y.o. female returns for routine follow-up. She is here today with family. She states that she is still experiencing nausea and medication not helping. She states that she vomits up to 2 times a day, nothing but juice and broth will stay down. She states that she is taking the Tagrisso as prescribed with no missed doses. She states that her swallowing has improved some since her last visit. She states that she does not want a feeding tube at this time. She was educated on nutrition and eating more calories. Denies any diarrhea. Denies any new pains. Had not noticed any recent bleeding such as epistaxis, hematuria or hematochezia. Denies recent chest pain on exertion, shortness of breath on minimal exertion, pre-syncopal episodes, or palpitations. Denies any numbness or tingling in hands or feet. Denies any recent fevers, infections, or recent hospitalizations. Patient reports appetite at 50% and energy level at 25%.    REVIEW OF SYSTEMS:  Review of Systems  Constitutional: Positive for appetite change.  Gastrointestinal: Positive for nausea and vomiting.     PAST MEDICAL/SURGICAL HISTORY:  Past Medical History:  Diagnosis Date  . Asthma   . Carpal tunnel syndrome   . Cataract    left eye  . Diabetes mellitus without complication ([place])   . DVT (deep venous thrombosis) ([place])   . Hypertension   . Pulmonary embolism ([place]) 03/2018   Past Surgical History:  Procedure Laterality Date  . ABDOMINAL HYSTERECTOMY    . biopsy of  right breast    . FOOT SURGERY    . implant left eye    . INTRAOCULAR LENS INSERTION       SOCIAL HISTORY:  Social History   Socioeconomic History  . Marital status: Widowed    Spouse name: Not on file  . Number of children: 4  . Years of education: 75  . Highest education level: High school graduate  Occupational History  . Not on file  Social Needs  . Financial resource strain: Very hard  . Food insecurity:    Worry: Sometimes true    Inability: Sometimes true  . Transportation needs:    Medical: No    Non-medical: No  Tobacco Use  . Smoking status: Never Smoker  . Smokeless tobacco: Never Used  Substance and Sexual Activity  . Alcohol use: Not Currently    Comment: occ  . Drug use: No  . Sexual activity: Yes    Partners: Male    Birth control/protection: Surgical  Lifestyle  . Physical activity:    Days per week: 0 days    Minutes per session: Not on file  . Stress: Only a little  Relationships  . Social connections:    Talks on phone: More than three times a week    Gets together: More than three times a week    Attends religious service: More than 4 times per year    Active member of club or organization: Yes    Attends meetings of clubs or organizations: More than 4 times  per year    Relationship status: Widowed  . Intimate partner violence:    Fear of current or ex partner: No    Emotionally abused: No    Physically abused: No    Forced sexual activity: No  Other Topics Concern  . 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    FAMILY HISTORY:  Family History  Problem Relation Age of Onset  . Heart failure Mother   . Stroke Mother   . Heart attack Mother   . Kidney failure Other   . Leukemia Father     CURRENT MEDICATIONS:  Outpatient  Encounter Medications as of 08/15/2018  Medication Sig  . albuterol (PROVENTIL HFA;VENTOLIN HFA) 108 (90 BASE) MCG/ACT inhaler Inhale 2 puffs into the lungs every 6 (six) hours as needed for wheezing.  Marland Kitchen albuterol (PROVENTIL) (2.5 MG/3ML) 0.083% nebulizer solution Take 3 mLs (2.5 mg total) by nebulization every 6 (six) hours as needed for wheezing or shortness of breath.  Marland Kitchen amLODipine (NORVASC) 5 MG tablet Take 5 mg by mouth daily.  . Blood Glucose Monitoring Suppl (TRUE METRIX METER) DEVI 1 Device by Does not apply route daily.  . chlorthalidone (HYGROTON) 25 MG tablet Take 25 mg by mouth daily.  Marland Kitchen LORazepam (ATIVAN) 0.5 MG tablet Take 0.5 mg by mouth at bedtime as needed for anxiety.  Marland Kitchen osimertinib mesylate (TAGRISSO) 80 MG tablet Take 1 tablet (80 mg total) by mouth daily.  . prochlorperazine (COMPAZINE) 10 MG tablet Take 1 tablet (10 mg total) by mouth every 6 (six) hours as needed for nausea or vomiting.  . TRUE METRIX BLOOD GLUCOSE TEST test strip   . TRUEplus Lancets 30G MISC   . [DISCONTINUED] losartan (COZAAR) 100 MG tablet Take 1 tablet (100 mg total) by mouth daily.  Marland Kitchen scopolamine (TRANSDERM-SCOP) 1 MG/3DAYS Place 1 patch (1.5 mg total) onto the skin every 3 (three) days.  . [DISCONTINUED] pantoprazole (PROTONIX) 40 MG tablet Take 1 tablet (40 mg total) by mouth daily.   Facility-Administered Encounter Medications as of 08/15/2018  Medication  . scopolamine (TRANSDERM-SCOP) 1 MG/3DAYS 1.5 mg    ALLERGIES:  Allergies  Allergen Reactions  . Banana Anaphylaxis  . Aspirin Other (See Comments)    G.I. Mariah Milling      PHYSICAL EXAM:  ECOG Performance status: 1  Vitals:   08/15/18 0910  BP: 108/75  Pulse: 94  Resp: 20  Temp: 98.6 F (37 C)  SpO2: 99%   Filed Weights   08/15/18 0910  Weight: 168 lb (76.2 kg)    Physical Exam Constitutional:      Appearance: Normal appearance.  Cardiovascular:     Rate and Rhythm: Normal rate and regular rhythm.     Heart sounds: Normal  heart sounds.  Pulmonary:     Breath sounds: Normal breath sounds.  Abdominal:     Palpations: Abdomen is soft. There is no mass.  Musculoskeletal:     Left lower leg: No edema.  Skin:    General: Skin is warm.  Neurological:     General: No focal deficit present.     Mental Status: She is alert and oriented to person, place, and time.  Psychiatric:        Mood and Affect: Mood normal.  Behavior: Behavior normal.      LABORATORY DATA:  I have reviewed the labs as listed.  CBC    Component Value Date/Time   WBC 3.1 (L) 08/15/2018 0834   RBC 4.93 08/15/2018 0834   HGB 12.8 08/15/2018 0834   HCT 40.1 08/15/2018 0834   PLT 158 08/15/2018 0834   MCV 81.3 08/15/2018 0834   MCH 26.0 08/15/2018 0834   MCHC 31.9 08/15/2018 0834   RDW 16.2 (H) 08/15/2018 0834   LYMPHSABS 1.2 08/15/2018 0834   MONOABS 0.4 08/15/2018 0834   EOSABS 0.1 08/15/2018 0834   BASOSABS 0.0 08/15/2018 0834   CMP Latest Ref Rng & Units 08/15/2018 08/05/2018 07/12/2018  Glucose 70 - 99 mg/dL 101(H) 132(H) 103(H)  BUN 8 - 23 mg/dL 12 44(H) 7(L)  Creatinine 0.44 - 1.00 mg/dL 1.22(H) 1.71(H) 0.71  Sodium 135 - 145 mmol/L 135 136 132(L)  Potassium 3.5 - 5.1 mmol/L 3.4(L) 3.8 3.1(L)  Chloride 98 - 111 mmol/L 102 101 96(L)  CO2 22 - 32 mmol/L '25 26 28  '$ Calcium 8.9 - 10.3 mg/dL 9.3 10.0 8.8(L)  Total Protein 6.5 - 8.1 g/dL 7.1 7.8 -  Total Bilirubin 0.3 - 1.2 mg/dL 0.4 0.6 -  Alkaline Phos 38 - 126 U/L 55 58 -  AST 15 - 41 U/L 19 16 -  ALT 0 - 44 U/L 20 17 -       DIAGNOSTIC IMAGING:  I have independently reviewed the scans and discussed with the patient.   I have reviewed Venita Lick LPN's note and agree with the documentation.  I personally performed a face-to-face visit, made revisions and my assessment and plan is as follows.    ASSESSMENT & PLAN:   Adenocarcinoma, lung, left ([place]) 1.  Metastatic adenocarcinoma of the left lung with multiple brain mets: -Guardant 360 showing EGFR exon  19 deletion (EGFR [id number]) - Presentation to ER on 04/24/2018 with shortness of breath.  She was never smoker. -CT PE protocol showed large pulmonary embolus in the descending interlobar pulmonary artery with occlusion of the right lower lobe pulmonary artery and additional small clots noted in the right middle lobe and right upper lobes with associated right pleural effusion and right lower lobe infarct.  It also showed incidental left lower lobe mass measuring 3.8 x 3.7 x 2.1 cm associated with hilar and mediastinal adenopathy. - PET/CT scan on 05/14/2018 shows hypermetabolic left lower lobe lung mass measuring 3.7 cm, SUV of 11.  Bilateral hilar adenopathy and subcarinal adenopathy.  Both adrenal glands appear thickened and irregular with increased SUV.  Right lower lobe airspace disease has hypermetabolic rim and a hypermetabolic nodule medial to the main airspace disease.  This may represent evolving infection/inflammation. - MRI of the brain on 05/30/2018 shows 20 subcentimeter enhancing brain lesions consistent with metastasis, no edema. - Left lower lobe lung biopsy on 06/18/2018 consistent with adenocarcinoma.  There was not enough tissue to run molecular testing. - Biopsy of the right supraclavicular lymph node on 07/11/2018 was consistent with metastatic adenocarcinoma. -Osimertinib 80 mg daily started on 07/28/2018.  She is not missing any doses.   -She is having nausea which is poorly controlled.  She is taking Compazine without much help. -We have repeated EKG today.  QTC is 441 ms.  - I have reviewed her blood work.  White count is 3.1 with ANC of 1400.  Magnesium is slightly on the lower side.  Creatinine is 1.2. - I have encouraged her to increase her nutrition.  She lost about 2 pounds from last visit. - She will come back in 7 to 10 days for follow-up with me.   2.  Recurrent pulmonary embolism: -This is likely secondary to active malignancy.  CT PE protocol on 04/24/2018 shows PE  in the right lower lobe pulmonary artery.  She was started on Eliquis. -CT of the chest with contrast on 07/10/2018 shows bilateral pulmonary emboli, on the left lung new since diagnosis. -She was subsequently switched to Lovenox injection once daily.   3.  Weight loss: -She cannot swallow any solid foods at this time. -She is drinking some Ensure.  She does report a lot of nausea.  She is using Compazine without help. -We will start her on scopolamine patch.  If this does not help, we will start her on Zofran. -She will see our nutritionist for recommendations today.        Orders placed this encounter:  Orders Placed This Encounter  Procedures  . EKG 12-Lead      Derek Jack, MD [place] [phone]

## 2018-08-15 NOTE — Patient Instructions (Addendum)
[place] at [facility] Discharge Instructions  You were seen today by Dr. Delton Coombes. He went over your recent lab results. He also discussed how you're eating and your nausea. He talked with you about a feeding tube. He will send you a nausea patch to your pharmacy. He will see you back in 10 days for labs and follow up.   Thank you for choosing [place] at [facility] to provide your oncology and hematology care.  To afford each patient quality time with our provider, please arrive at least 15 minutes before your scheduled appointment time.   If you have a lab appointment with the [facility] please come in thru the  Main Entrance and check in at the main information desk  You need to re-schedule your appointment should you arrive 10 or more minutes late.  We strive to give you quality time with our providers, and arriving late affects you and other patients whose appointments are after yours.  Also, if you no show three or more times for appointments you may be dismissed from the clinic at the providers discretion.     Again, thank you for choosing [facility].  Our hope is that these requests will decrease the amount of time that you wait before being seen by our physicians.       _____________________________________________________________  Should you have questions after your visit to [facility], please contact our office at [phone] between the hours of 8:00 a.m. and 4:30 p.m.  Voicemails left after 4:00 p.m. will not be returned until the following business day.  For prescription refill requests, have your pharmacy contact our office and allow 72 hours.    Cancer Center Support Programs:   > Cancer Support Group  2nd Tuesday of the month 1pm-2pm, Journey Room

## 2018-08-22 ENCOUNTER — Other Ambulatory Visit (HOSPITAL_COMMUNITY): Payer: Self-pay

## 2018-08-22 DIAGNOSIS — C3492 Malignant neoplasm of unspecified part of left bronchus or lung: Secondary | ICD-10-CM

## 2018-08-26 ENCOUNTER — Inpatient Hospital Stay (HOSPITAL_COMMUNITY): Payer: Medicare HMO | Admitting: Dietician

## 2018-08-26 ENCOUNTER — Other Ambulatory Visit: Payer: Self-pay

## 2018-08-26 ENCOUNTER — Inpatient Hospital Stay (HOSPITAL_BASED_OUTPATIENT_CLINIC_OR_DEPARTMENT_OTHER): Payer: Medicare HMO | Admitting: Hematology

## 2018-08-26 ENCOUNTER — Encounter (HOSPITAL_COMMUNITY): Payer: Self-pay | Admitting: Hematology

## 2018-08-26 ENCOUNTER — Inpatient Hospital Stay (HOSPITAL_COMMUNITY): Payer: Medicare HMO

## 2018-08-26 DIAGNOSIS — E46 Unspecified protein-calorie malnutrition: Secondary | ICD-10-CM

## 2018-08-26 DIAGNOSIS — Z86711 Personal history of pulmonary embolism: Secondary | ICD-10-CM | POA: Diagnosis not present

## 2018-08-26 DIAGNOSIS — I10 Essential (primary) hypertension: Secondary | ICD-10-CM | POA: Diagnosis not present

## 2018-08-26 DIAGNOSIS — Z7901 Long term (current) use of anticoagulants: Secondary | ICD-10-CM | POA: Diagnosis not present

## 2018-08-26 DIAGNOSIS — N289 Disorder of kidney and ureter, unspecified: Secondary | ICD-10-CM | POA: Diagnosis not present

## 2018-08-26 DIAGNOSIS — E119 Type 2 diabetes mellitus without complications: Secondary | ICD-10-CM | POA: Diagnosis not present

## 2018-08-26 DIAGNOSIS — C3492 Malignant neoplasm of unspecified part of left bronchus or lung: Secondary | ICD-10-CM

## 2018-08-26 DIAGNOSIS — I2699 Other pulmonary embolism without acute cor pulmonale: Secondary | ICD-10-CM | POA: Diagnosis not present

## 2018-08-26 DIAGNOSIS — C7931 Secondary malignant neoplasm of brain: Secondary | ICD-10-CM

## 2018-08-26 DIAGNOSIS — E860 Dehydration: Secondary | ICD-10-CM | POA: Diagnosis not present

## 2018-08-26 DIAGNOSIS — C3491 Malignant neoplasm of unspecified part of right bronchus or lung: Secondary | ICD-10-CM

## 2018-08-26 DIAGNOSIS — Z79899 Other long term (current) drug therapy: Secondary | ICD-10-CM

## 2018-08-26 DIAGNOSIS — Z86718 Personal history of other venous thrombosis and embolism: Secondary | ICD-10-CM

## 2018-08-26 LAB — COMPREHENSIVE METABOLIC PANEL
ALT: 18 U/L (ref 0–44)
AST: 17 U/L (ref 15–41)
Albumin: 3.6 g/dL (ref 3.5–5.0)
Alkaline Phosphatase: 49 U/L (ref 38–126)
Anion gap: 8 (ref 5–15)
BUN: 11 mg/dL (ref 8–23)
CHLORIDE: 106 mmol/L (ref 98–111)
CO2: 22 mmol/L (ref 22–32)
Calcium: 9.5 mg/dL (ref 8.9–10.3)
Creatinine, Ser: 1.02 mg/dL — ABNORMAL HIGH (ref 0.44–1.00)
GFR calc non Af Amer: 57 mL/min — ABNORMAL LOW (ref >60)
Glucose, Bld: 87 mg/dL (ref 70–99)
Potassium: 3.9 mmol/L (ref 3.5–5.1)
Sodium: 136 mmol/L (ref 135–145)
Total Bilirubin: 0.3 mg/dL (ref 0.3–1.2)
Total Protein: 7.3 g/dL (ref 6.5–8.1)

## 2018-08-26 LAB — CBC WITH DIFFERENTIAL/PLATELET
Abs Immature Granulocytes: 0 10*3/uL (ref 0.00–0.07)
Basophils Absolute: 0 10*3/uL (ref 0.0–0.1)
Basophils Relative: 1 %
EOS ABS: 0.1 10*3/uL (ref 0.0–0.5)
Eosinophils Relative: 2 %
HCT: 40.5 % (ref 36.0–46.0)
Hemoglobin: 13 g/dL (ref 12.0–15.0)
Immature Granulocytes: 0 %
Lymphocytes Relative: 32 %
Lymphs Abs: 0.8 10*3/uL (ref 0.7–4.0)
MCH: 25.9 pg — ABNORMAL LOW (ref 26.0–34.0)
MCHC: 32.1 g/dL (ref 30.0–36.0)
MCV: 80.8 fL (ref 80.0–100.0)
Monocytes Absolute: 0.3 10*3/uL (ref 0.1–1.0)
Monocytes Relative: 12 %
Neutro Abs: 1.4 10*3/uL — ABNORMAL LOW (ref 1.7–7.7)
Neutrophils Relative %: 53 %
PLATELETS: 203 10*3/uL (ref 150–400)
RBC: 5.01 MIL/uL (ref 3.87–5.11)
RDW: 16.9 % — AB (ref 11.5–15.5)
WBC: 2.6 10*3/uL — ABNORMAL LOW (ref 4.0–10.5)
nRBC: 0 % (ref 0.0–0.2)

## 2018-08-26 LAB — MAGNESIUM: Magnesium: 1.8 mg/dL (ref 1.7–2.4)

## 2018-08-26 NOTE — Progress Notes (Signed)
[place], [place] [address]   CLINIC:  Medical Oncology/Hematology  PCP:  [name], MD [address] [place] [address] [phone]   REASON FOR VISIT:  Follow-up for adenocarcinoma of the lung  CURRENT THERAPY:Tegrisso      INTERVAL HISTORY:  Kaitlin [name] 68 y.o. female returns for routine follow-up. She is here today alone. She states that she the difficulty swallowing she had at her last visit has resolved. Denies any nausea, or vomiting. Denies any new pains. Had not noticed any recent bleeding such as epistaxis, hematuria or hematochezia. Denies recent chest pain on exertion, shortness of breath on minimal exertion, pre-syncopal episodes, or palpitations. Denies any numbness or tingling in hands or feet. Denies any recent fevers, infections, or recent hospitalizations. Patient reports appetite at 100% and energy level at 50%.   REVIEW OF SYSTEMS:  Review of Systems  All other systems reviewed and are negative.    PAST MEDICAL/SURGICAL HISTORY:  Past Medical History:  Diagnosis Date   Asthma    Carpal tunnel syndrome    Cataract    left eye   Diabetes mellitus without complication ([place])    DVT (deep venous thrombosis) ([place])    Hypertension    Pulmonary embolism ([facility]) 03/2018   Past Surgical History:  Procedure Laterality Date   ABDOMINAL HYSTERECTOMY     biopsy of right breast     FOOT SURGERY     implant left eye     INTRAOCULAR LENS INSERTION       SOCIAL HISTORY:  Social History   Socioeconomic History   Marital status: Widowed    Spouse name: Not on file   Number of children: 4   Years of education: 12   Highest education level: High school graduate  Occupational History   Not on file  Social Needs   Financial resource strain: Very hard   Food insecurity:    Worry: Sometimes true    Inability: Sometimes true   Transportation needs:    Medical: No    Non-medical: No    Tobacco Use   Smoking status: Never Smoker   Smokeless tobacco: Never Used  Substance and Sexual Activity   Alcohol use: Not Currently    Comment: occ   Drug use: No   Sexual activity: Yes    Partners: Male    Birth control/protection: Surgical  Lifestyle   Physical activity:    Days per week: 0 days    Minutes per session: Not on file   Stress: Only a little  Relationships   Social connections:    Talks on phone: More than three times a week    Gets together: More than three times a week    Attends religious service: More than 4 times per year    Active member of club or organization: Yes    Attends meetings of clubs or organizations: More than 4 times per year    Relationship status: Widowed   Intimate partner violence:    Fear of current or ex partner: No    Emotionally abused: No    Physically abused: No    Forced sexual activity: No  Other Topics Concern   Not on file  Social History Narrative   The patient is widowed she has 1 son and 3 daughters and 10 grandchildren   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    FAMILY HISTORY:  Family History  Problem Relation Age of Onset   Heart failure Mother    Stroke Mother    Heart attack Mother    Kidney failure Other    Leukemia Father     CURRENT MEDICATIONS:  Outpatient Encounter Medications as of 08/26/2018  Medication Sig   albuterol (PROVENTIL HFA;VENTOLIN HFA) 108 (90 BASE) MCG/ACT inhaler Inhale 2 puffs into the lungs every 6 (six) hours as needed for wheezing.   albuterol (PROVENTIL) (2.5 MG/3ML) 0.083% nebulizer solution Take 3 mLs (2.5 mg total) by nebulization every 6 (six) hours as needed for wheezing or shortness of breath.   amLODipine (NORVASC) 5 MG tablet Take 5 mg by mouth daily.   Blood Glucose Monitoring Suppl (TRUE METRIX METER) DEVI 1 Device by Does not apply route daily.    chlorthalidone (HYGROTON) 25 MG tablet Take 25 mg by mouth daily.   enoxaparin (LOVENOX) 150 MG/ML injection    LORazepam (ATIVAN) 0.5 MG tablet Take 0.5 mg by mouth at bedtime as needed for anxiety.   osimertinib mesylate (TAGRISSO) 80 MG tablet Take 1 tablet (80 mg total) by mouth daily.   prochlorperazine (COMPAZINE) 10 MG tablet Take 1 tablet (10 mg total) by mouth every 6 (six) hours as needed for nausea or vomiting.   scopolamine (TRANSDERM-SCOP) 1 MG/3DAYS Place 1 patch (1.5 mg total) onto the skin every 3 (three) days.   TRUE METRIX BLOOD GLUCOSE TEST test strip    TRUEplus Lancets 30G MISC    No facility-administered encounter medications on file as of 08/26/2018.     ALLERGIES:  Allergies  Allergen Reactions   Banana Anaphylaxis   Aspirin Other (See Comments)    G.I. Upset      PHYSICAL EXAM:  ECOG Performance status: 1  Vitals:   08/26/18 0949  BP: 114/76  Pulse: 90  Temp: (!) 96.5 F (35.8 C)   Filed Weights   08/26/18 0949  Weight: 168 lb 12.8 oz (76.6 kg)    Physical Exam Constitutional:      Appearance: Normal appearance.  Cardiovascular:     Rate and Rhythm: Normal rate and regular rhythm.     Heart sounds: Normal heart sounds.  Pulmonary:     Effort: Pulmonary effort is normal.     Breath sounds: Normal breath sounds.  Abdominal:     General: There is no distension.     Palpations: Abdomen is soft.     Tenderness: There is no abdominal tenderness.  Musculoskeletal:        General: No swelling.  Skin:    General: Skin is warm.  Neurological:     Mental Status: She is alert and oriented to person, place, and time.  Psychiatric:        Mood and Affect: Mood normal.        Behavior: Behavior normal.      LABORATORY DATA:  I have reviewed the labs as listed.  CBC    Component Value Date/Time   WBC 2.6 (L) 08/26/2018 1131   RBC 5.01 08/26/2018 1131   HGB 13.0 08/26/2018 1131   HCT 40.5 08/26/2018 1131   PLT 203 08/26/2018 1131     MCV 80.8 08/26/2018 1131   MCH 25.9 (L) 08/26/2018 1131   MCHC 32.1 08/26/2018 1131   RDW 16.9 (H) 08/26/2018 1131   LYMPHSABS 0.8 08/26/2018 1131   MONOABS 0.3 08/26/2018 1131   EOSABS 0.1 08/26/2018 1131   BASOSABS 0.0  08/26/2018 1131   CMP Latest Ref Rng & Units 08/26/2018 08/15/2018 08/05/2018  Glucose 70 - 99 mg/dL 87 101(H) 132(H)  BUN 8 - 23 mg/dL 11 12 44(H)  Creatinine 0.44 - 1.00 mg/dL 1.02(H) 1.22(H) 1.71(H)  Sodium 135 - 145 mmol/L 136 135 136  Potassium 3.5 - 5.1 mmol/L 3.9 3.4(L) 3.8  Chloride 98 - 111 mmol/L 106 102 101  CO2 22 - 32 mmol/L _0 Calcium 8.9 - 10.3 mg/dL 9.5 9.3 10.0  Total Protein 6.5 - 8.1 g/dL 7.3 7.1 7.8  Total Bilirubin 0.3 - 1.2 mg/dL 0.3 0.4 0.6  Alkaline Phos 38 - 126 U/L 49 55 58  AST 15 - 41 U/L _1 ALT 0 - 44 U/L _2 DIAGNOSTIC IMAGING:  I have independently reviewed the scans and discussed with the patient.   I have reviewed Venita Lick LPN's note and agree with the documentation.  I personally performed a face-to-face visit, made revisions and my assessment and plan is as follows.    ASSESSMENT & PLAN:   Adenocarcinoma of lung, right ([place]) 1.  Metastatic adenocarcinoma of the left lung with multiple brain mets: -Guardant 360 showing EGFR exon 19 deletion (EGFR [id number]) - Presentation to ER on 04/24/2018 with shortness of breath.  She was never smoker. -CT PE protocol showed large pulmonary embolus in the descending interlobar pulmonary artery with occlusion of the right lower lobe pulmonary artery and additional small clots noted in the right middle lobe and right upper lobes with associated right pleural effusion and right lower lobe infarct.  It also showed incidental left lower lobe mass measuring 3.8 x 3.7 x 2.1 cm associated with hilar and mediastinal adenopathy. - PET/CT scan on 05/14/2018 shows hypermetabolic left lower lobe lung mass measuring 3.7 cm, SUV of 11.  Bilateral hilar adenopathy and  subcarinal adenopathy.  Both adrenal glands appear thickened and irregular with increased SUV.  Right lower lobe airspace disease has hypermetabolic rim and a hypermetabolic nodule medial to the main airspace disease.  This may represent evolving infection/inflammation. - MRI of the brain on 05/30/2018 shows 20 subcentimeter enhancing brain lesions consistent with metastasis, no edema. - Left lower lobe lung biopsy on 06/18/2018 consistent with adenocarcinoma.  There was not enough tissue to run molecular testing. - Biopsy of the right supraclavicular lymph node on 07/11/2018 was consistent with metastatic adenocarcinoma. -Osimertinib 80 mg daily started on 07/28/2018. -She has mild nausea associated with it.  Compazine is helping. -EKG 10 days ago shows QTC of 441 ms. - I have reviewed her blood work.  Electrolytes are within normal limits.  White count is slightly low with ANC of 1400.  Creatinine has improved. -Her swallowing ability has also improved indicating response. - I plan to see her back in 2 weeks for follow-up with labs.   2.  Recurrent pulmonary embolism: -This is likely secondary to active malignancy.  CT PE protocol on 04/24/2018 shows PE in the right lower lobe pulmonary artery.  She was started on Eliquis. -CT of the chest with contrast on 07/10/2018 shows bilateral pulmonary emboli, on the left lung new since diagnosis. -She was subsequently switched to Lovenox injection once daily.   3.  Weight loss: -Her weight has been stable since last visit. -Her dysphagia has improved since last visit.  She is able to eat some solid foods.     Total time spent is 25 minutes with more than 50%  of the time spent face-to-face discussing drug side effects, importance of nutrition and coordination of care.    Orders placed this encounter:  No orders of the defined types were placed in this encounter.     Derek Jack, MD [place] [phone]

## 2018-08-26 NOTE — Assessment & Plan Note (Signed)
1.  Metastatic adenocarcinoma of the left lung with multiple brain mets: -Guardant 360 showing EGFR exon 19 deletion (EGFR [id number]) - Presentation to ER on 04/24/2018 with shortness of breath.  She was never smoker. -CT PE protocol showed large pulmonary embolus in the descending interlobar pulmonary artery with occlusion of the right lower lobe pulmonary artery and additional small clots noted in the right middle lobe and right upper lobes with associated right pleural effusion and right lower lobe infarct.  It also showed incidental left lower lobe mass measuring 3.8 x 3.7 x 2.1 cm associated with hilar and mediastinal adenopathy. - PET/CT scan on 05/14/2018 shows hypermetabolic left lower lobe lung mass measuring 3.7 cm, SUV of 11.  Bilateral hilar adenopathy and subcarinal adenopathy.  Both adrenal glands appear thickened and irregular with increased SUV.  Right lower lobe airspace disease has hypermetabolic rim and a hypermetabolic nodule medial to the main airspace disease.  This may represent evolving infection/inflammation. - MRI of the brain on 05/30/2018 shows 20 subcentimeter enhancing brain lesions consistent with metastasis, no edema. - Left lower lobe lung biopsy on 06/18/2018 consistent with adenocarcinoma.  There was not enough tissue to run molecular testing. - Biopsy of the right supraclavicular lymph node on 07/11/2018 was consistent with metastatic adenocarcinoma. -Osimertinib 80 mg daily started on 07/28/2018. -She has mild nausea associated with it.  Compazine is helping. -EKG 10 days ago shows QTC of 441 ms. - I have reviewed her blood work.  Electrolytes are within normal limits.  White count is slightly low with ANC of 1400.  Creatinine has improved. -Her swallowing ability has also improved indicating response. - I plan to see her back in 2 weeks for follow-up with labs.   2.  Recurrent pulmonary embolism: -This is likely secondary to active malignancy.  CT PE protocol on  04/24/2018 shows PE in the right lower lobe pulmonary artery.  She was started on Eliquis. -CT of the chest with contrast on 07/10/2018 shows bilateral pulmonary emboli, on the left lung new since diagnosis. -She was subsequently switched to Lovenox injection once daily.   3.  Weight loss: -Her weight has been stable since last visit. -Her dysphagia has improved since last visit.  She is able to eat some solid foods.

## 2018-08-26 NOTE — Progress Notes (Addendum)
Nutrition Assessment  ASSESSMENT:  68 y/o female w/ PMHx HTN, DM2, Stage  IV adenocarcinoma of lung. Dx in Dec. with 12/18 PET findings consistent w/ metastatic disease. First seen at [facility] on 12/23. MRI brain 1/3 showed ~20 brain mets. Lung Biopsy 1/22 + for adenocarcinoma.  Begun oral chemo 3/2. S/P WBRT 3/3  Pt was seen by alternate RD 1.5 weeks ago. At that time, pt continued to have poor intake 2/2 to uncontrolled nausea. She was started on a scopolamine patch by MD last visit to try to ameliorate.   Per RD 3/20s note- pts intake had been limited to several tbsp of broth, sips of juice/gatorade and a can of soup here and there. She was consuming oral supplements-but because of her nausea-it took her an entire day to get down a single bottle. She had tried the Ensure clear and premier protein w/o any success. She had shown further wt loss as well, albeit minor.   RD following up remotely today. Prior to calling, noted pts weight today was in between those from her past 2 encounters.   Called and spoke w/ pts sister. Fortunately, she says the pt is doing [facility] better since starting the scopolamine patch, which she has been placing on shortly before her meals. Additionally, she has been premedicating with compazine. With these two medications combined, pt has apparently found significant relief from her nausea. She is now able to eat. Items the patient has been eating include ice cream, whole milk, pudding, applesauce. She is also drinking oral supplements.   Our phone call was brief as they were at another providers appointment when RD called.   Reviewing labs, her renal labs were improved from prior visit as well.    Wt Readings from Last 10 Encounters:  08/26/18 168 lb 12.8 oz (76.6 kg)  08/15/18 168 lb (76.2 kg)  08/06/18 169 lb 9.6 oz (76.9 kg)  08/05/18 174 lb (78.9 kg)  08/05/18 170 lb 4.8 oz (77.2 kg)  07/15/18 185 lb 3 oz (84 kg)  07/06/18 189 lb 13.1 oz (86.1 kg)  07/03/18 188 lb  3.2 oz (85.4 kg)  06/26/18 186 lb 1 oz (84.4 kg)  06/16/18 181 lb 14.1 oz (82.5 kg)   MEDICATIONS: Chemo: Tagrisso  Supportive medications: Compazine, ppi, ativan, scopolamine Other: Chlorthalidone  LABS: BUN/Creat: 11/1.02- Improved from prior draw.  Recent Labs  Lab 08/26/18 1131  NA 136  K 3.9  CL 106  CO2 22  BUN 11  CREATININE 1.02*  CALCIUM 9.5  MG 1.8  GLUCOSE 87   ANTHROPOMETRICS: Height:  Ht Readings from Last 1 Encounters:  08/05/18 5' 6" (1.676 m)   Weight:  Wt Readings from Last 1 Encounters:  08/26/18 168 lb 12.8 oz (76.6 kg)   BMI:  BMI Readings from Last 1 Encounters:  08/26/18 27.25 kg/m   UBW: 213 lbs (per pt report) Wt changes:  -16.5 lbs (8.9% bw) x6 weeks -29.5 lbs (14.8% bw) since end of Nov (time of ED presentation that led to cancer dx)  ESTIMATED ENERGY NEEDS:  Kcal: 1900-2150 (25-28 kcal/kg bw) Protein: 83-95g Pro (1.4-1.6g/kg ibw) Fluid: 1.9-2.1 L fluid (1 ml/kcal)  NUTRITION DIAGNOSIS:  Inadequate oral intake r/t early satiety, nausea and fatigue AEB loss of 15 lbs (>8%) bw in < 2 months  DOCUMENTATION CODES:  Not applicable  INTERVENTION:   Fortunately, pt has been able to find significant relief from nausea with the scopolamine patch in particular. RD did not get much chance to get  into specifics in pts diet, but she is reportedly eating well now.   Given much of her nutrition sounds to be coming from oral supplements. RD offered to provide her with case of Ensure.   Pt was nearby the hospital and said they could come back. We arranged a pick up spot where RD provided patient a case of Ensure.  Also provided samples of Boost.     Will schedule follow up in a few weeks.   Now that she is able to eat, will be able to provide better guidance on oral diet.   GOAL:  Oral intake to meet >90% of needs, wt stability  MET  MONITOR:  Chemotherapy tolerance, labs, weights, nausea, oral intake   Next Visit: 4/21 Remote   Burtis Junes RD, LDN, CNSC Clinical Nutrition Available Tues-Sat via Pager: [phone] 08/26/2018 11:46 AM

## 2018-08-26 NOTE — Patient Instructions (Addendum)
[place] at [facility] Discharge Instructions  You were seen today by Dr. Delton Coombes. He went over how you've been feeling since your last visit. He will see you back in 2 weeks for labs and follow up.   Thank you for choosing [place] at [facility] to provide your oncology and hematology care.  To afford each patient quality time with our provider, please arrive at least 15 minutes before your scheduled appointment time.   If you have a lab appointment with the [facility] please come in thru the  Main Entrance and check in at the main information desk  You need to re-schedule your appointment should you arrive 10 or more minutes late.  We strive to give you quality time with our providers, and arriving late affects you and other patients whose appointments are after yours.  Also, if you no show three or more times for appointments you may be dismissed from the clinic at the providers discretion.     Again, thank you for choosing [facility].  Our hope is that these requests will decrease the amount of time that you wait before being seen by our physicians.       _____________________________________________________________  Should you have questions after your visit to [facility], please contact our office at [phone] between the hours of 8:00 a.m. and 4:30 p.m.  Voicemails left after 4:00 p.m. will not be returned until the following business day.  For prescription refill requests, have your pharmacy contact our office and allow 72 hours.    Cancer Center Support Programs:   > Cancer Support Group  2nd Tuesday of the month 1pm-2pm, Journey Room

## 2018-08-27 ENCOUNTER — Telehealth: Payer: Self-pay

## 2018-08-27 NOTE — Telephone Encounter (Signed)
Spoke with sister advised that follow up appointment with be conducted over the phone with A. Bruning PA on 4/7 at scheduled appointment time. Sister verbalized understanding.

## 2018-09-02 ENCOUNTER — Ambulatory Visit
Admission: RE | Admit: 2018-09-02 | Discharge: 2018-09-02 | Disposition: A | Discharge status: Home / Self Care | Payer: Medicare HMO | Source: Ambulatory Visit | Attending: Urology | Admitting: Urology

## 2018-09-02 DIAGNOSIS — C3491 Malignant neoplasm of unspecified part of right bronchus or lung: Secondary | ICD-10-CM

## 2018-09-02 DIAGNOSIS — C7931 Secondary malignant neoplasm of brain: Secondary | ICD-10-CM

## 2018-09-03 ENCOUNTER — Other Ambulatory Visit (HOSPITAL_COMMUNITY): Payer: Self-pay | Admitting: *Deleted

## 2018-09-03 DIAGNOSIS — R110 Nausea: Secondary | ICD-10-CM

## 2018-09-03 NOTE — Progress Notes (Addendum)
Radiation Oncology         [phone] ________________________________  Name: Kaitlin [name] MRN: [id number]  Date: 09/02/2018  DOB: 14-Oct-1950  Post Treatment Note  CC: Kaitlin Perches, MD  Kaitlin Massed, MD  Diagnosis:   68 y.o. female with newly diagnosed metastatic NSCLC of the left lower lobe lung with significant mediastinal/hilar lymph node involvement andat least20 subcentimeter brain metastases.  Interval Since Last Radiation:  4 weeks  07/16/2018 - 07/29/2018:   1. The left lung was treated to 30 Gy in 10 fractions of 3 Gy. 2. The whole brain was treated to 30 Gy in 10 fractions of 3 Gy.  Narrative:  I spoke with the patient to conduct her routine scheduled 1 month follow up visit via telephone to spare the patient unnecessary potential exposure in the healthcare setting during the current COVID-19 pandemic.  The patient was notified in advance and gave permission to proceed with this visit format.  She tolerated radiation treatment relatively well with intermittent nausea and vomiting, despite Compazine and intermittent difficulty with swallowing. She denied any skin changes within the treatment fields or alopecia.                       On review of systems, the patient states that she is doing well overall.  She continues with decreased appetite and N/V after most meals despite using her Compazine as directed.  She reports resolution of dysphagia and throat irritation.  She denies chest pain, productive cough, hemoptysis or increased shortness of breath. She remains quite fatigued and weak but denies headaches, dizziness/imbalance, focal weakness, tremor, changes in visual or auditory acuity, tinnitus or seizure activity.  She has continued on targeted systemic therapy with Osimertinib 80 mg daily started on 07/28/2018 under the care and direction of Dr. Ellin [name]- last medical oncology visit was 08/26/18 with plans to continue her current treatment.   ALLERGIES:  is allergic to  banana and aspirin.  Meds: Current Outpatient Medications  Medication Sig Dispense Refill  . albuterol (PROVENTIL HFA;VENTOLIN HFA) 108 (90 BASE) MCG/ACT inhaler Inhale 2 puffs into the lungs every 6 (six) hours as needed for wheezing.    Marland Kitchen albuterol (PROVENTIL) (2.5 MG/3ML) 0.083% nebulizer solution Take 3 mLs (2.5 mg total) by nebulization every 6 (six) hours as needed for wheezing or shortness of breath. 75 mL 2  . amLODipine (NORVASC) 5 MG tablet Take 5 mg by mouth daily.    . Blood Glucose Monitoring Suppl (TRUE METRIX METER) DEVI 1 Device by Does not apply route daily. 1 Device 0  . chlorthalidone (HYGROTON) 25 MG tablet Take 25 mg by mouth daily.    Marland Kitchen enoxaparin (LOVENOX) 150 MG/ML injection     . LORazepam (ATIVAN) 0.5 MG tablet Take 0.5 mg by mouth at bedtime as needed for anxiety.    Marland Kitchen osimertinib mesylate (TAGRISSO) 80 MG tablet Take 1 tablet (80 mg total) by mouth daily. 30 tablet 1  . prochlorperazine (COMPAZINE) 10 MG tablet Take 1 tablet (10 mg total) by mouth every 6 (six) hours as needed for nausea or vomiting. 30 tablet 3  . scopolamine (TRANSDERM-SCOP) 1 MG/3DAYS Place 1 patch (1.5 mg total) onto the skin every 3 (three) days. 10 patch 1  . TRUE METRIX BLOOD GLUCOSE TEST test strip     . TRUEplus Lancets 30G MISC      No current facility-administered medications for this encounter.     Physical Findings: Unable to assess due  to to telephone format used for this visit.  Lab Findings: Lab Results  Component Value Date   WBC 2.6 (L) 08/26/2018   HGB 13.0 08/26/2018   HCT 40.5 08/26/2018   MCV 80.8 08/26/2018   PLT 203 08/26/2018     Radiographic Findings: No results found.  Impression/Plan: 1. 68 y.o. female with newly diagnosed metastatic NSCLC of the left lower lobe lung with significant mediastinal/hilar lymph node involvement andat least20 subcentimeter brain metastases. She appears to be recovering well from the effects of her recent radiotherapy. We  discussed the plan to repeat a brain MRI in approximately 2 months to assess her treatment response.  Pending those findings are stable, we will move forward with serial brain MRI every 3 months to monitor for any signs of disease recurrence or progression.  We will tentatively plan to see her back in the office following each scan to review findings and recommndations once the COVID-19 restrictions have been lifted and it is felt safe.  Otherwise, I will continue to call her for telephone follow up until that time.  She will also continue in routine follow up with Dr. Ellin [name] for management of her systemic disease treatment.  The current plan is to continue on targeted systemic therapy with Osimertinib 80 mg daily.  She appears to have a good understanding of our recommendations and is in agreement with the stated plan. She knows to call at any time in the interim with questions or concerns related to her previous radiation.  2. Nausea/Vomitting- I have advised that she take the Compazine every 6 hours as scheduled, ensuring that this is taken at least 45 min-hour prior to meals to see if this is more beneficial.  If this still does not improve her symptoms, she will call Dr. Ellin [name] to request her medication be changed.    Marguarite Arbour, PA-C

## 2018-09-04 ENCOUNTER — Other Ambulatory Visit (HOSPITAL_COMMUNITY): Payer: Self-pay | Admitting: *Deleted

## 2018-09-04 ENCOUNTER — Telehealth (HOSPITAL_COMMUNITY): Payer: Self-pay | Admitting: *Deleted

## 2018-09-04 MED ORDER — ONDANSETRON 4 MG PO TBDP: 4.0000 mg | ORAL_TABLET | Freq: Three times a day (TID) | ORAL | 2 refills | Status: DC | PRN

## 2018-09-04 NOTE — Telephone Encounter (Signed)
Spoke with pt's sister, Ronney Asters, to inform that Zofran 4 mg was sent to pt's pharmacy - she may take one tablet every 8 hours as needed for nausea.  Also notified of office visit on 09/09/18 and nutrition appt on 09/12/18.

## 2018-09-08 ENCOUNTER — Other Ambulatory Visit (HOSPITAL_COMMUNITY): Payer: Self-pay | Admitting: *Deleted

## 2018-09-08 DIAGNOSIS — C3491 Malignant neoplasm of unspecified part of right bronchus or lung: Secondary | ICD-10-CM

## 2018-09-08 NOTE — Progress Notes (Signed)
c 

## 2018-09-09 ENCOUNTER — Encounter (HOSPITAL_COMMUNITY): Payer: Self-pay | Admitting: Hematology

## 2018-09-09 ENCOUNTER — Inpatient Hospital Stay (HOSPITAL_BASED_OUTPATIENT_CLINIC_OR_DEPARTMENT_OTHER): Payer: Medicare HMO | Admitting: Hematology

## 2018-09-09 ENCOUNTER — Other Ambulatory Visit: Payer: Self-pay

## 2018-09-09 ENCOUNTER — Encounter (HOSPITAL_COMMUNITY): Payer: Self-pay | Admitting: Dietician

## 2018-09-09 ENCOUNTER — Inpatient Hospital Stay (HOSPITAL_COMMUNITY): Payer: Medicare HMO

## 2018-09-09 ENCOUNTER — Inpatient Hospital Stay (HOSPITAL_COMMUNITY): Payer: Medicare HMO | Attending: Hematology

## 2018-09-09 VITALS — BP 127/91 | HR 105 | Temp 97.9°F | Resp 18

## 2018-09-09 DIAGNOSIS — Z931 Gastrostomy status: Secondary | ICD-10-CM | POA: Insufficient documentation

## 2018-09-09 DIAGNOSIS — C3491 Malignant neoplasm of unspecified part of right bronchus or lung: Secondary | ICD-10-CM

## 2018-09-09 DIAGNOSIS — C7931 Secondary malignant neoplasm of brain: Secondary | ICD-10-CM

## 2018-09-09 DIAGNOSIS — I2699 Other pulmonary embolism without acute cor pulmonale: Secondary | ICD-10-CM

## 2018-09-09 DIAGNOSIS — Z86718 Personal history of other venous thrombosis and embolism: Secondary | ICD-10-CM | POA: Diagnosis not present

## 2018-09-09 DIAGNOSIS — N289 Disorder of kidney and ureter, unspecified: Secondary | ICD-10-CM | POA: Insufficient documentation

## 2018-09-09 DIAGNOSIS — I10 Essential (primary) hypertension: Secondary | ICD-10-CM | POA: Insufficient documentation

## 2018-09-09 DIAGNOSIS — E119 Type 2 diabetes mellitus without complications: Secondary | ICD-10-CM | POA: Diagnosis not present

## 2018-09-09 DIAGNOSIS — Z79899 Other long term (current) drug therapy: Secondary | ICD-10-CM | POA: Insufficient documentation

## 2018-09-09 DIAGNOSIS — C3492 Malignant neoplasm of unspecified part of left bronchus or lung: Secondary | ICD-10-CM | POA: Insufficient documentation

## 2018-09-09 DIAGNOSIS — E46 Unspecified protein-calorie malnutrition: Secondary | ICD-10-CM | POA: Diagnosis not present

## 2018-09-09 DIAGNOSIS — Z7901 Long term (current) use of anticoagulants: Secondary | ICD-10-CM

## 2018-09-09 DIAGNOSIS — Z86711 Personal history of pulmonary embolism: Secondary | ICD-10-CM | POA: Insufficient documentation

## 2018-09-09 DIAGNOSIS — E876 Hypokalemia: Secondary | ICD-10-CM

## 2018-09-09 LAB — CBC WITH DIFFERENTIAL/PLATELET
Abs Immature Granulocytes: 0.06 10*3/uL (ref 0.00–0.07)
Basophils Absolute: 0 10*3/uL (ref 0.0–0.1)
Basophils Relative: 0 %
Eosinophils Absolute: 0.1 10*3/uL (ref 0.0–0.5)
Eosinophils Relative: 1 %
HCT: 40.4 % (ref 36.0–46.0)
Hemoglobin: 13 g/dL (ref 12.0–15.0)
Immature Granulocytes: 1 %
Lymphocytes Relative: 14 %
Lymphs Abs: 1.1 10*3/uL (ref 0.7–4.0)
MCH: 25.7 pg — ABNORMAL LOW (ref 26.0–34.0)
MCHC: 32.2 g/dL (ref 30.0–36.0)
MCV: 80 fL (ref 80.0–100.0)
Monocytes Absolute: 0.9 10*3/uL (ref 0.1–1.0)
Monocytes Relative: 12 %
Neutro Abs: 5.6 10*3/uL (ref 1.7–7.7)
Neutrophils Relative %: 72 %
Platelets: 294 10*3/uL (ref 150–400)
RBC: 5.05 MIL/uL (ref 3.87–5.11)
RDW: 16.1 % — ABNORMAL HIGH (ref 11.5–15.5)
WBC: 7.8 10*3/uL (ref 4.0–10.5)
nRBC: 0 % (ref 0.0–0.2)

## 2018-09-09 LAB — COMPREHENSIVE METABOLIC PANEL
ALT: 11 U/L (ref 0–44)
AST: 15 U/L (ref 15–41)
Albumin: 3.3 g/dL — ABNORMAL LOW (ref 3.5–5.0)
Alkaline Phosphatase: 66 U/L (ref 38–126)
Anion gap: 12 (ref 5–15)
BUN: 15 mg/dL (ref 8–23)
CO2: 26 mmol/L (ref 22–32)
Calcium: 9.2 mg/dL (ref 8.9–10.3)
Chloride: 97 mmol/L — ABNORMAL LOW (ref 98–111)
Creatinine, Ser: 1.03 mg/dL — ABNORMAL HIGH (ref 0.44–1.00)
GFR calc Af Amer: >60 mL/min (ref >60)
GFR calc non Af Amer: 56 mL/min — ABNORMAL LOW (ref >60)
Glucose, Bld: 101 mg/dL — ABNORMAL HIGH (ref 70–99)
Potassium: 2.5 mmol/L — CL (ref 3.5–5.1)
Sodium: 135 mmol/L (ref 135–145)
Total Bilirubin: 0.5 mg/dL (ref 0.3–1.2)
Total Protein: 7.8 g/dL (ref 6.5–8.1)

## 2018-09-09 LAB — MAGNESIUM: Magnesium: 1.6 mg/dL — ABNORMAL LOW (ref 1.7–2.4)

## 2018-09-09 MED ORDER — COLCHICINE 0.6 MG PO TABS: 0.6000 mg | ORAL_TABLET | Freq: Every day | ORAL | 0 refills | Status: DC

## 2018-09-09 MED ORDER — SODIUM CHLORIDE 0.9 % IV SOLN
Freq: Once | INTRAVENOUS | Status: AC
  Administered 2018-09-09: 14:00:00 via INTRAVENOUS
  Filled 2018-09-09: qty 1000

## 2018-09-09 MED ORDER — POTASSIUM CHLORIDE ER 10 MEQ PO TBCR: 20.0000 meq | EXTENDED_RELEASE_TABLET | Freq: Two times a day (BID) | ORAL | 2 refills | Status: DC

## 2018-09-09 MED ORDER — SODIUM CHLORIDE 0.9 % IV SOLN
INTRAVENOUS | Status: DC
  Filled 2018-09-09 (×14): qty 1000

## 2018-09-09 NOTE — Progress Notes (Signed)
[place], [place] [address]   CLINIC:  Medical Oncology/Hematology  PCP:  [name], MD [address] [place] [address] [phone]   REASON FOR VISIT:  Follow-up for adenocarcinoma of the lung  CURRENT THERAPY:Tegrisso     INTERVAL HISTORY:  [name] 68 y.o. female returns for routine follow-up. She is here today alone. She states that she is taking the Tegrisso as directed with no missed doses. She states that she is also taking Lovenox injections daily. She states that she thinks she has gout in her right foot. She states that her swallowing is much better. She states that she doesn't have much energy lately. She states that she has watery stools at least twice a day if not more. Denies any nausea, or vomiting. Denies any new pains. Had not noticed any recent bleeding such as epistaxis, hematuria or hematochezia. Denies recent chest pain on exertion, shortness of breath on minimal exertion, pre-syncopal episodes, or palpitations. Denies any numbness or tingling in hands or feet. Denies any recent fevers, infections, or recent hospitalizations. Patient reports appetite at 50% and energy level at 25%.    REVIEW OF SYSTEMS:  Review of Systems  Constitutional: Positive for fatigue.  Cardiovascular: Positive for leg swelling.     PAST MEDICAL/SURGICAL HISTORY:  Past Medical History:  Diagnosis Date  . Asthma   . Carpal tunnel syndrome   . Cataract    left eye  . Diabetes mellitus without complication ([place])   . DVT (deep venous thrombosis) ([place])   . Hypertension   . Pulmonary embolism ([place]) 03/2018   Past Surgical History:  Procedure Laterality Date  . ABDOMINAL HYSTERECTOMY    . biopsy of right breast    . FOOT SURGERY    . implant left eye    . INTRAOCULAR LENS INSERTION       SOCIAL HISTORY:  Social History   Socioeconomic History  . Marital status: Widowed    Spouse name: Not on file  . Number of  children: 4  . Years of education: 30  . Highest education level: High school graduate  Occupational History  . Not on file  Social Needs  . Financial resource strain: Very hard  . Food insecurity:    Worry: Sometimes true    Inability: Sometimes true  . Transportation needs:    Medical: No    Non-medical: No  Tobacco Use  . Smoking status: Never Smoker  . Smokeless tobacco: Never Used  Substance and Sexual Activity  . Alcohol use: Not Currently    Comment: occ  . Drug use: No  . Sexual activity: Yes    Partners: Male    Birth control/protection: Surgical  Lifestyle  . Physical activity:    Days per week: 0 days    Minutes per session: Not on file  . Stress: Only a little  Relationships  . Social connections:    Talks on phone: More than three times a week    Gets together: More than three times a week    Attends religious service: More than 4 times per year    Active member of club or organization: Yes    Attends meetings of clubs or organizations: More than 4 times per year    Relationship status: Widowed  . Intimate partner violence:    Fear of current or ex partner: No    Emotionally abused: No    Physically abused: No  Forced sexual activity: No  Other Topics Concern  . 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    FAMILY HISTORY:  Family History  Problem Relation Age of Onset  . Heart failure Mother   . Stroke Mother   . Heart attack Mother   . Kidney failure Other   . Leukemia Father     CURRENT MEDICATIONS:  Outpatient Encounter Medications as of 09/09/2018  Medication Sig  . albuterol (PROVENTIL HFA;VENTOLIN HFA) 108 (90 BASE) MCG/ACT inhaler Inhale 2 puffs into the lungs every 6 (six) hours as needed for wheezing.  Marland Kitchen albuterol (PROVENTIL)  (2.5 MG/3ML) 0.083% nebulizer solution Take 3 mLs (2.5 mg total) by nebulization every 6 (six) hours as needed for wheezing or shortness of breath.  Marland Kitchen amLODipine (NORVASC) 5 MG tablet Take 5 mg by mouth daily.  . Blood Glucose Monitoring Suppl (TRUE METRIX METER) DEVI 1 Device by Does not apply route daily.  . chlorthalidone (HYGROTON) 25 MG tablet Take 25 mg by mouth daily.  Marland Kitchen enoxaparin (LOVENOX) 150 MG/ML injection   . LORazepam (ATIVAN) 0.5 MG tablet Take 0.5 mg by mouth at bedtime as needed for anxiety.  . ondansetron (ZOFRAN ODT) 4 MG disintegrating tablet Take 1 tablet (4 mg total) by mouth every 8 (eight) hours as needed for nausea or vomiting.  Marland Kitchen osimertinib mesylate (TAGRISSO) 80 MG tablet Take 1 tablet (80 mg total) by mouth daily.  . pantoprazole (PROTONIX) 40 MG tablet   . prochlorperazine (COMPAZINE) 10 MG tablet Take 1 tablet (10 mg total) by mouth every 6 (six) hours as needed for nausea or vomiting.  Marland Kitchen scopolamine (TRANSDERM-SCOP) 1 MG/3DAYS Place 1 patch (1.5 mg total) onto the skin every 3 (three) days.  . TRUE METRIX BLOOD GLUCOSE TEST test strip   . TRUEplus Lancets 30G MISC   . colchicine 0.6 MG tablet Take 1 tablet (0.6 mg total) by mouth daily.  . potassium chloride (K-DUR) 10 MEQ tablet Take 2 tablets (20 mEq total) by mouth 2 (two) times daily.   No facility-administered encounter medications on file as of 09/09/2018.     ALLERGIES:  Allergies  Allergen Reactions  . Banana Anaphylaxis  . Aspirin Other (See Comments)    G.I. Upset      PHYSICAL EXAM:  ECOG Performance status: 2  Vitals:   09/09/18 1145  BP: (!) 127/91  Pulse: (!) 105  Resp: 18  Temp: 97.9 F (36.6 C)  SpO2: 100%   There were no vitals filed for this visit.  Physical Exam Vitals signs reviewed.  Constitutional:      Appearance: Normal appearance.  Cardiovascular:     Rate and Rhythm: Normal rate and regular rhythm.     Heart sounds: Normal heart sounds.  Pulmonary:     Effort:  Pulmonary effort is normal.     Breath sounds: Normal breath sounds.  Abdominal:     General: Bowel sounds are normal. There is no distension.     Palpations: Abdomen is soft. There is no mass.  Musculoskeletal:     Comments: Right foot is swollen about the instep.  It is tender to palpation.  Skin:    General: Skin is warm.  Neurological:     General: No focal deficit  present.     Mental Status: She is alert and oriented to person, place, and time.  Psychiatric:        Mood and Affect: Mood normal.        Behavior: Behavior normal.      LABORATORY DATA:  I have reviewed the labs as listed.  CBC    Component Value Date/Time   WBC 7.8 09/09/2018 1013   RBC 5.05 09/09/2018 1013   HGB 13.0 09/09/2018 1013   HCT 40.4 09/09/2018 1013   PLT 294 09/09/2018 1013   MCV 80.0 09/09/2018 1013   MCH 25.7 (L) 09/09/2018 1013   MCHC 32.2 09/09/2018 1013   RDW 16.1 (H) 09/09/2018 1013   LYMPHSABS 1.1 09/09/2018 1013   MONOABS 0.9 09/09/2018 1013   EOSABS 0.1 09/09/2018 1013   BASOSABS 0.0 09/09/2018 1013   CMP Latest Ref Rng & Units 09/09/2018 08/26/2018 08/15/2018  Glucose 70 - 99 mg/dL 101(H) 87 101(H)  BUN 8 - 23 mg/dL '15 11 12  '$ Creatinine 0.44 - 1.00 mg/dL 1.03(H) 1.02(H) 1.22(H)  Sodium 135 - 145 mmol/L 135 136 135  Potassium 3.5 - 5.1 mmol/L 2.5(LL) 3.9 3.4(L)  Chloride 98 - 111 mmol/L 97(L) 106 102  CO2 22 - 32 mmol/L '26 22 25  '$ Calcium 8.9 - 10.3 mg/dL 9.2 9.5 9.3  Total Protein 6.5 - 8.1 g/dL 7.8 7.3 7.1  Total Bilirubin 0.3 - 1.2 mg/dL 0.5 0.3 0.4  Alkaline Phos 38 - 126 U/L 66 49 55  AST 15 - 41 U/L '15 17 19  '$ ALT 0 - 44 U/L '11 18 20       '$ DIAGNOSTIC IMAGING:  I have independently reviewed the scans and discussed with the patient.   I have reviewed Venita Lick LPN's note and agree with the documentation.  I personally performed a face-to-face visit, made revisions and my assessment and plan is as follows.    ASSESSMENT & PLAN:   Adenocarcinoma, lung, left  ([place]) 1.  Metastatic adenocarcinoma of the left lung with multiple brain mets: -Guardant 360 showing EGFR exon 19 deletion (EGFR [id number]) - Presentation to ER on 04/24/2018 with shortness of breath.  She was never smoker. -CT PE protocol showed large pulmonary embolus in the descending interlobar pulmonary artery with occlusion of the right lower lobe pulmonary artery and additional small clots noted in the right middle lobe and right upper lobes with associated right pleural effusion and right lower lobe infarct.  It also showed incidental left lower lobe mass measuring 3.8 x 3.7 x 2.1 cm associated with hilar and mediastinal adenopathy. - PET/CT scan on 05/14/2018 shows hypermetabolic left lower lobe lung mass measuring 3.7 cm, SUV of 11.  Bilateral hilar adenopathy and subcarinal adenopathy.  Both adrenal glands appear thickened and irregular with increased SUV.  Right lower lobe airspace disease has hypermetabolic rim and a hypermetabolic nodule medial to the main airspace disease.  This may represent evolving infection/inflammation. - MRI of the brain on 05/30/2018 shows 20 subcentimeter enhancing brain lesions consistent with metastasis, no edema. - Left lower lobe lung biopsy on 06/18/2018 consistent with adenocarcinoma.  There was not enough tissue to run molecular testing. - Biopsy of the right supraclavicular lymph node on 07/11/2018 was consistent with metastatic adenocarcinoma. -Osimertinib 80 mg daily started on 07/28/2018. -She is continuing to have intermittent nausea.  She also complains of severe fatigue. - She is complaining of diarrhea 1-2 watery stools per day.  She has electrolyte abnormalities including severe hypokalemia and hypomagnesemia.  I have told her to take Imodium as needed.  She will have electrolytes repleted today.  I will start her on oral potassium 20 mEq twice daily.  I will reevaluate her in 1 week. -If she is continuing to have severe fatigue, will consider  decreasing osimertinib to 40 mg daily.   2.  Recurrent pulmonary embolism: -This is likely secondary to active malignancy.  CT PE protocol on 04/24/2018 shows PE in the right lower lobe pulmonary artery.  She was started on Eliquis. -CT of the chest with contrast on 07/10/2018 shows bilateral pulmonary emboli, on the left lung new since diagnosis. -She was subsequently switched to Lovenox injection once daily.   3.  Weight loss: -She is continuing to lose weight.  She is not able to keep down fluids despite taking antiemetics. -She is also having diarrhea.  She was instructed to take Imodium. - We could not weigh her today as she could not stand up because of gout attack in her right foot. -We had a prolonged discussion with her and her daughter.  In view of her continued loss of weight, we have recommended PEG tube placement.  She is agreeable.  She will also have dietary evaluation.  4.  Acute gout episode: - She has some swelling and tenderness and pain of the left foot about the instep.  Reportedly that is her usual site of gout flares. -I will start her on colchicine.  We have sent a prescription.  I will also check her uric acid.  We will plan to start allopurinol at next visit.      Total time spent is 40 minutes with more than 50% of the time spent face-to-face discussing the need for PEG tube placement and coordination of care.    Orders placed this encounter:  Orders Placed This Encounter  Procedures  . CBC with Differential/Platelet  . Comprehensive metabolic panel  . Uric acid  . Magnesium      Derek Jack, MD [place] [phone]   \

## 2018-09-09 NOTE — Patient Instructions (Signed)
[facility]  Discharge Instructions:   _______________________________________________________________  Thank you for choosing [facility] at [facility] to provide your oncology and hematology care.  To afford each patient quality time with our providers, please arrive at least 15 minutes before your scheduled appointment.  You need to re-schedule your appointment if you arrive 10 or more minutes late.  We strive to give you quality time with our providers, and arriving late affects you and other patients whose appointments are after yours.  Also, if you no show three or more times for appointments you may be dismissed from the clinic.  Again, thank you for choosing  Cancer Center at [facility]. Our hope is that these requests will allow you access to exceptional care and in a timely manner. _______________________________________________________________  If you have questions after your visit, please contact our office at [phone] between the hours of 8:30 a.m. and 5:00 p.m. Voicemails left after 4:30 p.m. will not be returned until the following business day. _______________________________________________________________  For prescription refill requests, have your pharmacy contact our office. _______________________________________________________________  Recommendations made by the consultant and any test results will be sent to your referring physician. _______________________________________________________________ 

## 2018-09-09 NOTE — Progress Notes (Signed)
Hydration fluids given today per MD orders. Tolerated infusion without adverse affects. Vital signs stable. No complaints at this time. Discharged from clinic via wheel chair. F/U with [facility] as scheduled. Copies of return appointment's printed and given to the patient at this time.

## 2018-09-09 NOTE — Addendum Note (Signed)
Addended by: Henreitta Leber E on: 09/09/2018 01:30 PM   Modules accepted: Orders

## 2018-09-09 NOTE — Assessment & Plan Note (Addendum)
1.  Metastatic adenocarcinoma of the left lung with multiple brain mets: -Guardant 360 showing EGFR exon 19 deletion (EGFR [id number]) - Presentation to ER on 04/24/2018 with shortness of breath.  She was never smoker. -CT PE protocol showed large pulmonary embolus in the descending interlobar pulmonary artery with occlusion of the right lower lobe pulmonary artery and additional small clots noted in the right middle lobe and right upper lobes with associated right pleural effusion and right lower lobe infarct.  It also showed incidental left lower lobe mass measuring 3.8 x 3.7 x 2.1 cm associated with hilar and mediastinal adenopathy. - PET/CT scan on 05/14/2018 shows hypermetabolic left lower lobe lung mass measuring 3.7 cm, SUV of 11.  Bilateral hilar adenopathy and subcarinal adenopathy.  Both adrenal glands appear thickened and irregular with increased SUV.  Right lower lobe airspace disease has hypermetabolic rim and a hypermetabolic nodule medial to the main airspace disease.  This may represent evolving infection/inflammation. - MRI of the brain on 05/30/2018 shows 20 subcentimeter enhancing brain lesions consistent with metastasis, no edema. - Left lower lobe lung biopsy on 06/18/2018 consistent with adenocarcinoma.  There was not enough tissue to run molecular testing. - Biopsy of the right supraclavicular lymph node on 07/11/2018 was consistent with metastatic adenocarcinoma. -Osimertinib 80 mg daily started on 07/28/2018. -She is continuing to have intermittent nausea.  She also complains of severe fatigue. - She is complaining of diarrhea 1-2 watery stools per day.  She has electrolyte abnormalities including severe hypokalemia and hypomagnesemia.  I have told her to take Imodium as needed.  She will have electrolytes repleted today.  I will start her on oral potassium 20 mEq twice daily.  I will reevaluate her in 1 week. -If she is continuing to have severe fatigue, will consider decreasing  osimertinib to 40 mg daily.   2.  Recurrent pulmonary embolism: -This is likely secondary to active malignancy.  CT PE protocol on 04/24/2018 shows PE in the right lower lobe pulmonary artery.  She was started on Eliquis. -CT of the chest with contrast on 07/10/2018 shows bilateral pulmonary emboli, on the left lung new since diagnosis. -She was subsequently switched to Lovenox injection once daily.   3.  Weight loss: -She is continuing to lose weight.  She is not able to keep down fluids despite taking antiemetics. -She is also having diarrhea.  She was instructed to take Imodium. - We could not weigh her today as she could not stand up because of gout attack in her right foot. -We had a prolonged discussion with her and her daughter.  In view of her continued loss of weight, we have recommended PEG tube placement.  She is agreeable.  She will also have dietary evaluation.  4.  Acute gout episode: - She has some swelling and tenderness and pain of the left foot about the instep.  Reportedly that is her usual site of gout flares. -I will start her on colchicine.  We have sent a prescription.  I will also check her uric acid.  We will plan to start allopurinol at next visit.

## 2018-09-09 NOTE — Progress Notes (Signed)
Nutrition Note  RD approached by MD regarding pt. Apparently, pts severe nausea is still ongoing. He broached PEG with pt and asked RD to also speak with her regarding this.    RD met with pt. Pt says that despite what her sister had told RD on 3/31, her nausea was never truly fixed by the scopolamine patch- "I have always had nausea, even with the patch". The pt herself says her nausea is relentless; it will be manageable one day, than unbearable the next. On her bad days, such as today, she has nausea from the time she wakes up, to the time she goes to sleep. Just this AM, she reports having severe nausea and episodes of dry heaving. At time of RD visit (~2 pm), she has not yet had anything to eat today. She attributes this to the nausea and then having to come to the appt. In addition to nausea, she reports having an awful mouth feel and sense of taste. She says it feels "like there is a film on my teeth that I cant get off". Food is not palatable to her. She is devoid of an appetite  Took diet recall from yesterday Breakfast: Grits-> says she through it up a couple hrs later Lunch: 1/2 can of chicken noodle soup. (stayed down) Dinner: Other half of can of chicken noodle soup (stayed down) Beverages: Sips on Gatorade Supplements: Is unable to tolerate the Ensure. Vomits immediately.  From this recall, would estimate pt is consuming <500 kcals/day   RD discussed what tube feeding would look like and how feeds are administered via PEG. Showed example of a tube. She is silent during our discussion. It is pts understanding that PEG will be "only short-term". We discussed potential complications of TF and that, depending on source of nausea, its benefit may be modest. RD encouraged her to talk more with her family. RD got feeling pt does not truly know if she wants a PEG. She makes several comments that suggest her family has poor insight into her disease process and that they are blindly speaking on her  behalf.  RD discussed his concerns w/ MD & nurse navigator. Will monitor pt over next few days as she sees Psychologist, sport and exercise. If PEG is scheduled, RD will reach out to [name] at that time. Given COVID precautions, TF/PEG education would be given to pts family through home health  Burtis Junes RD, LDN, [facility] Clinical [facility] Available Tues-Sat via Pager: [phone] 09/09/2018 4:52 PM

## 2018-09-09 NOTE — Progress Notes (Signed)
Pt seen by Dr. Delton Coombes today. Add on for fluids. 2grams of mag and 20 meq of K+. VSS. Pt has no complaints at this time.

## 2018-09-09 NOTE — Patient Instructions (Addendum)
[facility] at [facility] Discharge Instructions  You were seen today by Dr. Katragadda. He went over your recent lab results. He will see you back in 1 week for labs and follow up.   Thank you for choosing [facility] at [facility] to provide your oncology and hematology care.  To afford each patient quality time with our provider, please arrive at least 15 minutes before your scheduled appointment time.   If you have a lab appointment with the Cancer Center please come in thru the  Main Entrance and check in at the main information desk  You need to re-schedule your appointment should you arrive 10 or more minutes late.  We strive to give you quality time with our providers, and arriving late affects you and other patients whose appointments are after yours.  Also, if you no show three or more times for appointments you may be dismissed from the clinic at the providers discretion.     Again, thank you for choosing [facility].  Our hope is that these requests will decrease the amount of time that you wait before being seen by our physicians.       _____________________________________________________________  Should you have questions after your visit to  Cancer Center, please contact our office at [phone] between the hours of 8:00 a.m. and 4:30 p.m.  Voicemails left after 4:00 p.m. will not be returned until the following business day.  For prescription refill requests, have your pharmacy contact our office and allow 72 hours.    Cancer Center Support Programs:   > Cancer Support Group  2nd Tuesday of the month 1pm-2pm, Journey Room    

## 2018-09-09 NOTE — Progress Notes (Signed)
CRITICAL VALUE ALERT  Critical Value:  K+ 2.5  Date & Time Notied:  09/09/2018 at 1054  Provider Notified: Dr. Delton Coombes  Orders Received/Actions taken: infuse K+ 20 mEq and Mg++ 2g in NS 1L over 2 hours.

## 2018-09-09 NOTE — Addendum Note (Signed)
Addended by: Henreitta Leber E on: 09/09/2018 01:26 PM   Modules accepted: Orders

## 2018-09-10 ENCOUNTER — Encounter (HOSPITAL_COMMUNITY): Payer: Self-pay | Admitting: *Deleted

## 2018-09-10 NOTE — Progress Notes (Signed)
I spoke with patient and advised her to hold Lovenox injections on Thursday and Friday per Dr. Constance Haw.  Patient verbalizes understanding.  Patient aware that Dr. Constance Haw will notify her of when to resume Lovenox injections.

## 2018-09-11 ENCOUNTER — Encounter (HOSPITAL_COMMUNITY): Payer: Self-pay | Admitting: Emergency Medicine

## 2018-09-11 ENCOUNTER — Emergency Department (HOSPITAL_COMMUNITY): Payer: Medicare HMO

## 2018-09-11 ENCOUNTER — Encounter (HOSPITAL_COMMUNITY): Admission: RE | Admit: 2018-09-11 | Payer: Medicare HMO | Source: Ambulatory Visit

## 2018-09-11 ENCOUNTER — Observation Stay (HOSPITAL_COMMUNITY)
Admission: EM | Admit: 2018-09-11 | Discharge: 2018-09-13 | Disposition: A | Discharge status: Home / Self Care | Payer: Medicare HMO | Attending: Internal Medicine | Admitting: Internal Medicine

## 2018-09-11 ENCOUNTER — Other Ambulatory Visit: Payer: Self-pay

## 2018-09-11 DIAGNOSIS — E876 Hypokalemia: Secondary | ICD-10-CM | POA: Diagnosis not present

## 2018-09-11 DIAGNOSIS — F0390 Unspecified dementia without behavioral disturbance: Secondary | ICD-10-CM | POA: Diagnosis not present

## 2018-09-11 DIAGNOSIS — Z7901 Long term (current) use of anticoagulants: Secondary | ICD-10-CM | POA: Insufficient documentation

## 2018-09-11 DIAGNOSIS — Z8249 Family history of ischemic heart disease and other diseases of the circulatory system: Secondary | ICD-10-CM | POA: Insufficient documentation

## 2018-09-11 DIAGNOSIS — R1310 Dysphagia, unspecified: Secondary | ICD-10-CM | POA: Insufficient documentation

## 2018-09-11 DIAGNOSIS — R000 Tachycardia, unspecified: Secondary | ICD-10-CM | POA: Diagnosis not present

## 2018-09-11 DIAGNOSIS — C3492 Malignant neoplasm of unspecified part of left bronchus or lung: Secondary | ICD-10-CM | POA: Insufficient documentation

## 2018-09-11 DIAGNOSIS — E1136 Type 2 diabetes mellitus with diabetic cataract: Secondary | ICD-10-CM | POA: Diagnosis not present

## 2018-09-11 DIAGNOSIS — R079 Chest pain, unspecified: Secondary | ICD-10-CM | POA: Diagnosis not present

## 2018-09-11 DIAGNOSIS — R112 Nausea with vomiting, unspecified: Secondary | ICD-10-CM | POA: Insufficient documentation

## 2018-09-11 DIAGNOSIS — I2699 Other pulmonary embolism without acute cor pulmonale: Secondary | ICD-10-CM

## 2018-09-11 DIAGNOSIS — E43 Unspecified severe protein-calorie malnutrition: Secondary | ICD-10-CM | POA: Insufficient documentation

## 2018-09-11 DIAGNOSIS — R627 Adult failure to thrive: Secondary | ICD-10-CM | POA: Diagnosis not present

## 2018-09-11 DIAGNOSIS — R634 Abnormal weight loss: Secondary | ICD-10-CM | POA: Diagnosis present

## 2018-09-11 DIAGNOSIS — I10 Essential (primary) hypertension: Secondary | ICD-10-CM | POA: Diagnosis not present

## 2018-09-11 DIAGNOSIS — Z01818 Encounter for other preprocedural examination: Secondary | ICD-10-CM

## 2018-09-11 DIAGNOSIS — Z86711 Personal history of pulmonary embolism: Secondary | ICD-10-CM | POA: Diagnosis not present

## 2018-09-11 DIAGNOSIS — J45909 Unspecified asthma, uncomplicated: Secondary | ICD-10-CM | POA: Insufficient documentation

## 2018-09-11 DIAGNOSIS — F419 Anxiety disorder, unspecified: Secondary | ICD-10-CM | POA: Diagnosis not present

## 2018-09-11 DIAGNOSIS — M109 Gout, unspecified: Secondary | ICD-10-CM | POA: Insufficient documentation

## 2018-09-11 DIAGNOSIS — K219 Gastro-esophageal reflux disease without esophagitis: Secondary | ICD-10-CM | POA: Insufficient documentation

## 2018-09-11 DIAGNOSIS — Z6825 Body mass index (BMI) 25.0-25.9, adult: Secondary | ICD-10-CM | POA: Insufficient documentation

## 2018-09-11 DIAGNOSIS — Z923 Personal history of irradiation: Secondary | ICD-10-CM | POA: Insufficient documentation

## 2018-09-11 DIAGNOSIS — Z66 Do not resuscitate: Secondary | ICD-10-CM | POA: Insufficient documentation

## 2018-09-11 DIAGNOSIS — Z86718 Personal history of other venous thrombosis and embolism: Secondary | ICD-10-CM | POA: Insufficient documentation

## 2018-09-11 DIAGNOSIS — Z87892 Personal history of anaphylaxis: Secondary | ICD-10-CM | POA: Insufficient documentation

## 2018-09-11 DIAGNOSIS — H269 Unspecified cataract: Secondary | ICD-10-CM | POA: Insufficient documentation

## 2018-09-11 DIAGNOSIS — Z79899 Other long term (current) drug therapy: Secondary | ICD-10-CM | POA: Insufficient documentation

## 2018-09-11 DIAGNOSIS — Z886 Allergy status to analgesic agent status: Secondary | ICD-10-CM | POA: Diagnosis not present

## 2018-09-11 DIAGNOSIS — C7931 Secondary malignant neoplasm of brain: Secondary | ICD-10-CM | POA: Diagnosis not present

## 2018-09-11 DIAGNOSIS — Z91018 Allergy to other foods: Secondary | ICD-10-CM | POA: Insufficient documentation

## 2018-09-11 HISTORY — DX: Pneumonia, unspecified organism: J18.9

## 2018-09-11 HISTORY — DX: Malignant (primary) neoplasm, unspecified: C80.1

## 2018-09-11 LAB — CBC WITH DIFFERENTIAL/PLATELET
Abs Immature Granulocytes: 0.05 10*3/uL (ref 0.00–0.07)
Basophils Absolute: 0 10*3/uL (ref 0.0–0.1)
Basophils Relative: 1 %
Eosinophils Absolute: 0.1 10*3/uL (ref 0.0–0.5)
Eosinophils Relative: 2 %
HCT: 42.9 % (ref 36.0–46.0)
Hemoglobin: 13.9 g/dL (ref 12.0–15.0)
Immature Granulocytes: 1 %
Lymphocytes Relative: 18 %
Lymphs Abs: 1.1 10*3/uL (ref 0.7–4.0)
MCH: 26 pg (ref 26.0–34.0)
MCHC: 32.4 g/dL (ref 30.0–36.0)
MCV: 80.2 fL (ref 80.0–100.0)
Monocytes Absolute: 0.8 10*3/uL (ref 0.1–1.0)
Monocytes Relative: 13 %
Neutro Abs: 3.9 10*3/uL (ref 1.7–7.7)
Neutrophils Relative %: 65 %
Platelets: 346 10*3/uL (ref 150–400)
RBC: 5.35 MIL/uL — ABNORMAL HIGH (ref 3.87–5.11)
RDW: 16 % — ABNORMAL HIGH (ref 11.5–15.5)
WBC: 6 10*3/uL (ref 4.0–10.5)
nRBC: 0 % (ref 0.0–0.2)

## 2018-09-11 LAB — SURGICAL PCR SCREEN
MRSA, PCR: NEGATIVE
Staphylococcus aureus: NEGATIVE

## 2018-09-11 LAB — COMPREHENSIVE METABOLIC PANEL
ALT: 13 U/L (ref 0–44)
AST: 15 U/L (ref 15–41)
Albumin: 3.4 g/dL — ABNORMAL LOW (ref 3.5–5.0)
Alkaline Phosphatase: 67 U/L (ref 38–126)
Anion gap: 13 (ref 5–15)
BUN: 12 mg/dL (ref 8–23)
CO2: 25 mmol/L (ref 22–32)
Calcium: 9.7 mg/dL (ref 8.9–10.3)
Chloride: 98 mmol/L (ref 98–111)
Creatinine, Ser: 1 mg/dL (ref 0.44–1.00)
GFR calc Af Amer: >60 mL/min (ref >60)
GFR calc non Af Amer: 58 mL/min — ABNORMAL LOW (ref >60)
Glucose, Bld: 91 mg/dL (ref 70–99)
Potassium: 2.5 mmol/L — CL (ref 3.5–5.1)
Sodium: 136 mmol/L (ref 135–145)
Total Bilirubin: 0.5 mg/dL (ref 0.3–1.2)
Total Protein: 8.3 g/dL — ABNORMAL HIGH (ref 6.5–8.1)

## 2018-09-11 LAB — MAGNESIUM: Magnesium: 2 mg/dL (ref 1.7–2.4)

## 2018-09-11 LAB — BASIC METABOLIC PANEL
Anion gap: 10 (ref 5–15)
Anion gap: 8 (ref 5–15)
BUN: 10 mg/dL (ref 8–23)
BUN: 9 mg/dL (ref 8–23)
CO2: 25 mmol/L (ref 22–32)
CO2: 24 mmol/L (ref 22–32)
Calcium: 9.1 mg/dL (ref 8.9–10.3)
Calcium: 8.7 mg/dL — ABNORMAL LOW (ref 8.9–10.3)
Chloride: 103 mmol/L (ref 98–111)
Chloride: 105 mmol/L (ref 98–111)
Creatinine, Ser: 0.84 mg/dL (ref 0.44–1.00)
Creatinine, Ser: 0.72 mg/dL (ref 0.44–1.00)
GFR calc Af Amer: >60 mL/min (ref >60)
GFR calc Af Amer: >60 mL/min (ref >60)
GFR calc non Af Amer: >60 mL/min (ref >60)
GFR calc non Af Amer: >60 mL/min (ref >60)
Glucose, Bld: 89 mg/dL (ref 70–99)
Glucose, Bld: 100 mg/dL — ABNORMAL HIGH (ref 70–99)
Potassium: 2.9 mmol/L — ABNORMAL LOW (ref 3.5–5.1)
Potassium: 2.8 mmol/L — ABNORMAL LOW (ref 3.5–5.1)
Sodium: 138 mmol/L (ref 135–145)
Sodium: 137 mmol/L (ref 135–145)

## 2018-09-11 LAB — LIPASE, BLOOD: Lipase: 37 U/L (ref 11–51)

## 2018-09-11 LAB — LACTIC ACID, PLASMA: Lactic Acid, Venous: 1.1 mmol/L (ref 0.5–1.9)

## 2018-09-11 MED ORDER — ACETAMINOPHEN 325 MG PO TABS: 650.0000 mg | ORAL_TABLET | Freq: Four times a day (QID) | ORAL | Status: DC | PRN

## 2018-09-11 MED ORDER — ACETAMINOPHEN 650 MG RE SUPP: 650.0000 mg | Freq: Four times a day (QID) | RECTAL | Status: DC | PRN

## 2018-09-11 MED ORDER — POTASSIUM CHLORIDE 20 MEQ/15ML (10%) PO SOLN
40.0000 meq | Freq: Once | ORAL | Status: AC
  Administered 2018-09-11: 40 meq via ORAL
  Filled 2018-09-11: qty 30

## 2018-09-11 MED ORDER — POTASSIUM CHLORIDE 10 MEQ/100ML IV SOLN
10.0000 meq | INTRAVENOUS | Status: AC
  Administered 2018-09-11 (×2): 10 meq via INTRAVENOUS
  Filled 2018-09-11 (×2): qty 100

## 2018-09-11 MED ORDER — LACTATED RINGERS IV BOLUS
1000.0000 mL | Freq: Once | INTRAVENOUS | Status: AC
  Administered 2018-09-11: 1000 mL via INTRAVENOUS

## 2018-09-11 MED ORDER — POTASSIUM CHLORIDE IN NACL 40-0.9 MEQ/L-% IV SOLN
INTRAVENOUS | Status: DC
  Administered 2018-09-11 – 2018-09-12 (×2): 75 mL/h via INTRAVENOUS
  Administered 2018-09-13: 01:00:00 50 mL/h via INTRAVENOUS

## 2018-09-11 MED ORDER — ALBUTEROL SULFATE (2.5 MG/3ML) 0.083% IN NEBU: 2.5000 mg | INHALATION_SOLUTION | Freq: Four times a day (QID) | RESPIRATORY_TRACT | Status: DC | PRN

## 2018-09-11 MED ORDER — METOCLOPRAMIDE HCL 10 MG PO TABS: 10.0000 mg | ORAL_TABLET | Freq: Three times a day (TID) | ORAL | 0 refills | Status: DC | PRN

## 2018-09-11 MED ORDER — POTASSIUM CHLORIDE CRYS ER 20 MEQ PO TBCR
20.0000 meq | EXTENDED_RELEASE_TABLET | Freq: Two times a day (BID) | ORAL | Status: DC
  Administered 2018-09-11 – 2018-09-13 (×3): 20 meq via ORAL
  Filled 2018-09-11 (×3): qty 1
  Filled 2018-09-11 (×3): qty 2

## 2018-09-11 MED ORDER — AMLODIPINE BESYLATE 5 MG PO TABS
5.0000 mg | ORAL_TABLET | Freq: Every day | ORAL | Status: DC
  Administered 2018-09-11 – 2018-09-13 (×2): 5 mg via ORAL
  Filled 2018-09-11 (×3): qty 1

## 2018-09-11 MED ORDER — COLCHICINE 0.6 MG PO TABS: 0.6000 mg | ORAL_TABLET | Freq: Every day | ORAL | Status: DC

## 2018-09-11 MED ORDER — METOCLOPRAMIDE HCL 5 MG/ML IJ SOLN
10.0000 mg | Freq: Once | INTRAMUSCULAR | Status: AC
  Administered 2018-09-11: 04:00:00 10 mg via INTRAVENOUS
  Filled 2018-09-11: qty 2

## 2018-09-11 MED ORDER — CEFAZOLIN SODIUM-DEXTROSE 2-4 GM/100ML-% IV SOLN: 2.0000 g | INTRAVENOUS | Status: DC

## 2018-09-11 MED ORDER — POTASSIUM CHLORIDE 10 MEQ/100ML IV SOLN
10.0000 meq | Freq: Once | INTRAVENOUS | Status: AC
  Administered 2018-09-11: 10:00:00 10 meq via INTRAVENOUS
  Filled 2018-09-11: qty 100

## 2018-09-11 MED ORDER — OSIMERTINIB MESYLATE 80 MG PO TABS: 80.0000 mg | ORAL_TABLET | Freq: Every day | ORAL | Status: DC

## 2018-09-11 MED ORDER — PANTOPRAZOLE SODIUM 40 MG PO TBEC
40.0000 mg | DELAYED_RELEASE_TABLET | Freq: Every day | ORAL | Status: DC
  Administered 2018-09-11 – 2018-09-13 (×2): 40 mg via ORAL
  Filled 2018-09-11 (×3): qty 1

## 2018-09-11 MED ORDER — COLCHICINE 0.6 MG PO TABS
0.3000 mg | ORAL_TABLET | Freq: Every day | ORAL | Status: DC
  Administered 2018-09-13: 09:00:00 0.3 mg via ORAL
  Filled 2018-09-11 (×3): qty 0.5
  Filled 2018-09-11: qty 1
  Filled 2018-09-11: qty 0.5
  Filled 2018-09-11: qty 1

## 2018-09-11 MED ORDER — ONDANSETRON HCL 4 MG PO TABS: 4.0000 mg | ORAL_TABLET | Freq: Four times a day (QID) | ORAL | Status: DC | PRN

## 2018-09-11 MED ORDER — SODIUM CHLORIDE 0.9 % IV SOLN
INTRAVENOUS | Status: DC
  Administered 2018-09-11: 10:00:00 via INTRAVENOUS

## 2018-09-11 MED ORDER — LORAZEPAM 0.5 MG PO TABS: 0.5000 mg | ORAL_TABLET | Freq: Every evening | ORAL | Status: DC | PRN

## 2018-09-11 MED ORDER — CHLORHEXIDINE GLUCONATE CLOTH 2 % EX PADS: 6.0000 | MEDICATED_PAD | Freq: Once | CUTANEOUS | Status: DC

## 2018-09-11 MED ORDER — ONDANSETRON HCL 4 MG/2ML IJ SOLN: 4.0000 mg | Freq: Four times a day (QID) | INTRAMUSCULAR | Status: DC | PRN

## 2018-09-11 MED ORDER — MAGNESIUM SULFATE 2 GM/50ML IV SOLN
2.0000 g | Freq: Once | INTRAVENOUS | Status: AC
  Administered 2018-09-11: 06:00:00 2 g via INTRAVENOUS
  Filled 2018-09-11: qty 50

## 2018-09-11 NOTE — Progress Notes (Signed)
Pt was admitted to hospital w/ planned PEG placement tomorrow.  Will need home health to educate family on TF administration and [employer] to deliver the TF supplies. Reached out to [facility] and [facility] reps regarding onboarding. Notified CSM/SW of patients needs.   Burtis Junes RD, LDN, [facility] Clinical Nutrition Available Tues-Sat via Pager: [phone] 09/11/2018 1:23 PM

## 2018-09-11 NOTE — Progress Notes (Signed)
Patient to get PEG tomorrow. Coming in with vomiting and low K.  Have discussed with Dr. Reather Converse, would like to get her admitted to [facility] to get more resuscitation and K replaced. Will plan for PEG tomorrow. Possibly could go home after PEG pending no other issues.  Hold her Lovenox for procedure. Will see in AM. Repeat labs in AM. Could get nutrition to see her to further discuss her PEG while inpatient.  Much  appreciated.  Curlene Labrum, MD

## 2018-09-11 NOTE — TOC Initial Note (Signed)
Transition of Care [facility]) - Initial/Assessment Note    Patient Details  Name: Kaitlin [name] MRN: [id number] Date of Birth: [birth year]  Transition of Care [facility]) CM/SW Contact:    Annice Needy, LCSW Phone Number: 09/11/2018, 3:10 PM  Clinical Narrative:                 At baseline, patient's daughter has moved in with her as a support. Patient has a walker in the home. She states that her toilet is too low and that she needs a riser. HH and DME providers discussed and patient agreeable to [facility] and [facility].  Referrals made to both companies.   Expected Discharge Plan: Home w Home Health Services Barriers to Discharge: No Barriers Identified   Patient Goals and CMS Choice Patient states their goals for this hospitalization and ongoing recovery are:: To return home with family.  CMS Medicare.gov Compare Post Acute Care list provided to:: Patient Choice offered to / list presented to : Patient  Expected Discharge Plan and Services Expected Discharge Plan: Home w Home Health Services In-house Referral: Nutrition Discharge Planning Services: CM Consult Post Acute Care Choice: Durable Medical Equipment, Home Health Living arrangements for the past 2 months: Single Family Home Expected Discharge Date: 09/11/18               DME Arranged: Bedside commode DME Agency: [facility] HH Arranged: RN HH Agency: [facility]  Prior Living Arrangements/Services Living arrangements for the past 2 months: Single Family Home Lives with:: Adult Children Patient language and need for interpreter reviewed:: No Do you feel safe going back to the place where you live?: Yes   n/a  Need for Family Participation in Patient Care: Yes (Comment) Care giver support system in place?: Yes (comment) Current home services: DME(Patient has walker) Criminal Activity/Legal Involvement Pertinent to Current Situation/Hospitalization: No - Comment as needed  Activities of Daily  Living Home Assistive Devices/Equipment: CBG Meter ADL Screening (condition at time of admission) Patient's cognitive ability adequate to safely complete daily activities?: Yes Is the patient deaf or have difficulty hearing?: No Does the patient have difficulty seeing, even when wearing glasses/contacts?: No Does the patient have difficulty concentrating, remembering, or making decisions?: No Patient able to express need for assistance with ADLs?: Yes Does the patient have difficulty dressing or bathing?: No Independently performs ADLs?: Yes (appropriate for developmental age) Does the patient have difficulty walking or climbing stairs?: No Weakness of Legs: None Weakness of Arms/Hands: None  Permission Sought/Granted Permission sought to share information with : PCP, Other (comment)(HH/DME supplier)       Permission granted to share info w AGENCY: [facility], [facility]        Emotional Assessment Appearance:: Appears stated age Attitude/Demeanor/Rapport: Gracious Affect (typically observed): Accepting Orientation: : Oriented to Self, Oriented to Place, Oriented to  Time, Oriented to Situation Alcohol / Substance Use: Not Applicable Psych Involvement: No (comment)  Admission diagnosis:  Hypokalemia [E87.6] Preop testing [Z01.818] Non-intractable vomiting with nausea, unspecified vomiting type [R11.2] Patient Active Problem List   Diagnosis Date Noted  . Hypokalemia 09/09/2018  . Brain metastases (HCC) 07/12/2018  . Malignant pericardial effusion (HCC) 07/08/2018  . HCAP (healthcare-associated pneumonia) 07/06/2018  . Adenocarcinoma of lung, right (HCC) 07/06/2018  . Adenocarcinoma, lung, left (HCC) 07/03/2018  . Asthma 06/16/2018  . Hypertension 06/16/2018  . Diabetes mellitus without complication (HCC) 06/16/2018  . DVT (deep venous thrombosis) (HCC) 06/16/2018  . Pulmonary embolism during treatment  with long-term anticoagulation therapy (HCC) 06/16/2018  .  Dysphagia 05/05/2018  . Sepsis (HCC)   . Lung mass   . Pulmonary emboli (HCC) 04/24/2018  . Routine screening for STI (sexually transmitted infection) 03/14/2016   PCP:  [name], MD Pharmacy:   [facility] - [place], [place] - [address] [place] [address] Phone: [phone] Fax: [phone]     Social Determinants of Health (SDOH) Interventions    Readmission Risk Interventions No flowsheet data found.

## 2018-09-11 NOTE — H&P (Addendum)
History and Physical    Kaitlin [name] OAC:[id number] DOB: Jun 07, 1950 DOA: 09/11/2018  PCP: [name], MD  Patient coming from: Home  I have personally briefly reviewed patient's old medical records in [facility]  Chief Complaint: Nausea and vomiting  HPI: Kaitlin [name] is a 68 y.o. female with medical history significant of lung cancer with brain mets, hypertension, venous thromboembolism on anticoagulation, presents to the hospital with nausea and vomiting.  Patient reports having episodes of nausea and vomiting, decreased p.o. intake intermittently since ending radiation approximately 6 weeks ago.  Her current episode began around 10 PM yesterday evening.  She is not had any fever, cough, shortness of breath.  She does feel generally weak.  She also reports having diarrhea.  She has been losing weight.  She is evaluated in the emergency room and found to have significant hypokalemia.  She is due to have a PEG tube placed tomorrow.  General surgery requested the patient be admitted for medical management in order to be optimized for procedure tomorrow.  Review of Systems: As per HPI otherwise 10 point review of systems negative.    Past Medical History:  Diagnosis Date  . Asthma   . Carpal tunnel syndrome   . Cataract    left eye  . Diabetes mellitus without complication (HCC)   . DVT (deep venous thrombosis) (HCC)   . Hypertension   . Pulmonary embolism (HCC) 03/2018    Past Surgical History:  Procedure Laterality Date  . ABDOMINAL HYSTERECTOMY    . biopsy of right breast    . FOOT SURGERY    . implant left eye    . INTRAOCULAR LENS INSERTION      Social History:  reports that she has never smoked. She has never used smokeless tobacco. She reports previous alcohol use. She reports that she does not use drugs.  Allergies  Allergen Reactions  . Banana Anaphylaxis  . Aspirin Other (See Comments)    G.IDorena Dew     Family History  Problem Relation Age of Onset  .  Heart failure Mother   . Stroke Mother   . Heart attack Mother   . Kidney failure Other   . Leukemia Father      Prior to Admission medications   Medication Sig Start Date End Date Taking? Authorizing Provider  albuterol (PROVENTIL HFA;VENTOLIN HFA) 108 (90 BASE) MCG/ACT inhaler Inhale 2 puffs into the lungs every 6 (six) hours as needed for wheezing.   Yes [provider]  albuterol (PROVENTIL) (2.5 MG/3ML) 0.083% nebulizer solution Take 3 mLs (2.5 mg total) by nebulization every 6 (six) hours as needed for wheezing or shortness of breath. 04/29/18  Yes Gherghe, Daylene Katayama, MD  amLODipine (NORVASC) 5 MG tablet Take 5 mg by mouth daily.   Yes [provider]  chlorthalidone (HYGROTON) 25 MG tablet Take 25 mg by mouth daily.   Yes [provider]  colchicine 0.6 MG tablet Take 1 tablet (0.6 mg total) by mouth daily. 09/09/18  Yes Doreatha Massed, MD  enoxaparin (LOVENOX) 150 MG/ML injection Inject 150 mg into the skin daily.  08/11/18  Yes [provider]  LORazepam (ATIVAN) 0.5 MG tablet Take 0.5 mg by mouth at bedtime as needed for anxiety.   Yes [provider]  ondansetron (ZOFRAN ODT) 4 MG disintegrating tablet Take 1 tablet (4 mg total) by mouth every 8 (eight) hours as needed for nausea or vomiting. 09/04/18  Yes Doreatha Massed, MD  osimertinib  mesylate (TAGRISSO) 80 MG tablet Take 1 tablet (80 mg total) by mouth daily. 07/15/18  Yes Doreatha Massed, MD  pantoprazole (PROTONIX) 40 MG tablet Take 40 mg by mouth daily.  08/26/18  Yes [provider]  potassium chloride (K-DUR) 10 MEQ tablet Take 2 tablets (20 mEq total) by mouth 2 (two) times daily. 09/09/18  Yes Doreatha Massed, MD  prochlorperazine (COMPAZINE) 10 MG tablet Take 1 tablet (10 mg total) by mouth every 6 (six) hours as needed for nausea or vomiting. Patient taking differently: Take 10 mg by mouth 2 (two) times daily.  07/31/18  Yes Lockamy, Randi L, NP-C  Blood  Glucose Monitoring Suppl (TRUE METRIX METER) DEVI 1 Device by Does not apply route daily. Patient not taking: Reported on 09/11/2018 04/29/18   Leatha Gilding, MD  metoCLOPramide (REGLAN) 10 MG tablet Take 1 tablet (10 mg total) by mouth every 8 (eight) hours as needed for refractory nausea / vomiting. 09/11/18   Mesner, Barbara Cower, MD  scopolamine (TRANSDERM-SCOP) 1 MG/3DAYS Place 1 patch (1.5 mg total) onto the skin every 3 (three) days. Patient not taking: Reported on 09/10/2018 08/15/18   Doree Barthel, NP-C  TRUEplus Lancets 30G MISC  07/19/18   [provider]    Physical Exam: Vitals:   09/11/18 0922 09/11/18 1000 09/11/18 1050 09/11/18 1300  BP: (!) 155/96 112/90 125/76 115/85  Pulse: 92 87 88 78  Resp: 12 17 16 16   Temp:   97.8 F (36.6 C) 97.9 F (36.6 C)  TempSrc:   Oral Oral  SpO2: 100% 100% 100% 100%  Weight:   72.5 kg   Height:   5\' 6"  (1.676 m)     Constitutional: NAD, calm, comfortable Eyes: PERRL, lids and conjunctivae normal ENMT: Mucous membranes are moist. Posterior pharynx clear of any exudate or lesions.Normal dentition.  Neck: normal, supple, no masses, no thyromegaly Respiratory: clear to auscultation bilaterally, no wheezing, no crackles. Normal respiratory effort. No accessory muscle use.  Cardiovascular: Regular rate and rhythm, no murmurs / rubs / gallops. No extremity edema. 2+ pedal pulses. No carotid bruits.  Abdomen: no tenderness, no masses palpated. No hepatosplenomegaly. Bowel sounds positive.  Musculoskeletal: no clubbing / cyanosis. No joint deformity upper and lower extremities. Good ROM, no contractures. Normal muscle tone.  Skin: no rashes, lesions, ulcers. No induration Neurologic: CN 2-12 grossly intact. Sensation intact, DTR normal. Strength 5/5 in all 4.  Psychiatric: Normal judgment and insight. Alert and oriented x 3. Normal mood.    Labs on Admission: I have personally reviewed following labs and imaging studies  CBC: Recent  Labs  Lab 09/09/18 1013 09/11/18 0340  WBC 7.8 6.0  NEUTROABS 5.6 3.9  HGB 13.0 13.9  HCT 40.4 42.9  MCV 80.0 80.2  PLT 294 346   Basic Metabolic Panel: Recent Labs  Lab 09/09/18 1013 09/11/18 0405 09/11/18 0844  NA 135 136 138  K 2.5* 2.5* 2.9*  CL 97* 98 103  CO2 26 25 25   GLUCOSE 101* 91 89  BUN 15 12 10   CREATININE 1.03* 1.00 0.84  CALCIUM 9.2 9.7 9.1  MG 1.6* 2.0  --    GFR: Estimated Creatinine Clearance: 66.3 mL/min (by C-G formula based on SCr of 0.84 mg/dL). Liver Function Tests: Recent Labs  Lab 09/09/18 1013 09/11/18 0405  AST 15 15  ALT 11 13  ALKPHOS 66 67  BILITOT 0.5 0.5  PROT 7.8 8.3*  ALBUMIN 3.3* 3.4*   Recent Labs  Lab 09/11/18 0405  LIPASE 37   No results for input(s): AMMONIA in the last 168 hours. Coagulation Profile: No results for input(s): INR, PROTIME in the last 168 hours. Cardiac Enzymes: No results for input(s): CKTOTAL, CKMB, CKMBINDEX, TROPONINI in the last 168 hours. BNP (last 3 results) No results for input(s): PROBNP in the last 8760 hours. HbA1C: No results for input(s): HGBA1C in the last 72 hours. CBG: No results for input(s): GLUCAP in the last 168 hours. Lipid Profile: No results for input(s): CHOL, HDL, LDLCALC, TRIG, CHOLHDL, LDLDIRECT in the last 72 hours. Thyroid Function Tests: No results for input(s): TSH, T4TOTAL, FREET4, T3FREE, THYROIDAB in the last 72 hours. Anemia Panel: No results for input(s): VITAMINB12, FOLATE, FERRITIN, TIBC, IRON, RETICCTPCT in the last 72 hours. Urine analysis:    Component Value Date/Time   COLORURINE YELLOW 07/06/2018 1555   APPEARANCEUR CLEAR 07/06/2018 1555   LABSPEC 1.012 07/06/2018 1555   PHURINE 6.0 07/06/2018 1555   GLUCOSEU NEGATIVE 07/06/2018 1555   HGBUR MODERATE (A) 07/06/2018 1555   BILIRUBINUR NEGATIVE 07/06/2018 1555   KETONESUR NEGATIVE 07/06/2018 1555   PROTEINUR NEGATIVE 07/06/2018 1555   UROBILINOGEN 0.2 04/11/2007 1936   NITRITE NEGATIVE 07/06/2018  1555   LEUKOCYTESUR MODERATE (A) 07/06/2018 1555    Radiological Exams on Admission: Dg Chest Port 1 View  Result Date: 09/11/2018 CLINICAL DATA:  Chest pain. EXAM: PORTABLE CHEST 1 VIEW COMPARISON:  Radiographs of July 06, 2018. CT scan of July 10, 2018. FINDINGS: The heart size and mediastinal contours are within normal limits. No pneumothorax is noted. Left lung is clear. Right basilar opacity is improved compared to prior exam with minimal right pleural effusion present. The visualized skeletal structures are unremarkable. IMPRESSION: Significantly decreased right basilar opacity is noted consistent with improving pneumonia or atelectasis. Minimal right pleural effusion may be present. Electronically Signed   By: Lupita Raider M.D.   On: 09/11/2018 09:55    EKG: Independently reviewed.  Sinus rhythm without acute changes  Assessment/Plan Active Problems:   Hypertension   Pulmonary embolism during treatment with long-term anticoagulation therapy (HCC)   Adenocarcinoma, lung, left (HCC)   Brain metastases (HCC)   Hypokalemia   Nausea and vomiting   Weight loss     1. Hypokalemia.  Related to persistent nausea and vomiting and decreased p.o. intake.  She is also on chlorthalidone will be discontinued.  Magnesium is normal. 2. Nausea and vomiting.  Question related to recent treatments.  She has been losing weight.  Plan is for PEG tube placement per general surgery. 3. Adenocarcinoma of the lung with brain metastases.  Continue to follow with oncology. 4. History of venous thromboembolism on chronic Lovenox therapy.  Anticoagulation currently on hold for procedure tomorrow.  Can resume after PEG tube placement. 5. Hypertension.  Continue amlodipine.  Hold chlorthalidone in setting of hypokalemia.  DVT prophylaxis: SCDs Code Status: DNR Family Communication: Discussed with sister over the phone Disposition Plan: Discharge home after PEG tube placement Consults called:  General surgery Admission status: Observation, MedSurg  Erick Blinks MD [facility]   If 7PM-7AM, please contact night-coverage [email or web address]   09/11/2018, 3:36 PM

## 2018-09-11 NOTE — ED Provider Notes (Signed)
Emergency Department Provider Note   I have reviewed the triage vital signs and the nursing notes.   HISTORY  Chief Complaint Emesis   HPI Kaitlin [name] is a 68 y.o. female with a history of DVT, pulmonary embolus, advanced lung adenocarcinoma, hypokalemia the presents to the emergency department today secondary to nausea and vomiting.  Patient states she has been on chemo recently and has been causing severe vomiting but usually is controlled with Zofran.  She is actually scheduled to get a EGD and feeding tube placed on Friday but tonight she had 5 episodes over few hours times band of significant vomiting.  No blood or bile that she noticed.  No fevers.  She has some diarrhea during this time as well.  No abdominal pain.  States this is happened in the past but not this long.  She supposed be on oral potassium at home.   No other associated or modifying symptoms.    Past Medical History:  Diagnosis Date  . Asthma   . Carpal tunnel syndrome   . Cataract    left eye  . Diabetes mellitus without complication ([place])   . DVT (deep venous thrombosis) ([place])   . Hypertension   . Pulmonary embolism ([place]) 03/2018    Patient Active Problem List   Diagnosis Date Noted  . Hypokalemia 09/09/2018  . Brain metastases ([place]) 07/12/2018  . Malignant pericardial effusion ([place]) 07/08/2018  . HCAP (healthcare-associated pneumonia) 07/06/2018  . Adenocarcinoma of lung, right ([place]) 07/06/2018  . Adenocarcinoma, lung, left ([place]) 07/03/2018  . Asthma 06/16/2018  . Hypertension 06/16/2018  . Diabetes mellitus without complication ([place]) 14/78/2956  . DVT (deep venous thrombosis) ([place]) 06/16/2018  . Pulmonary embolism during treatment with long-term anticoagulation therapy ([place]) 06/16/2018  . Dysphagia 05/05/2018  . Sepsis ([place])   . Lung mass   . Pulmonary emboli ([place]) 04/24/2018  . Routine screening for STI (sexually transmitted infection) 03/14/2016    Past Surgical History:  Procedure  Laterality Date  . ABDOMINAL HYSTERECTOMY    . biopsy of right breast    . FOOT SURGERY    . implant left eye    . INTRAOCULAR LENS INSERTION      Current Outpatient Rx  . Order #: [id number] Class: Historical Med  . Order #: [id number] Class: Print  . Order #: [id number] Class: Historical Med  . Order #: [id number] Class: Print  . Order #: [id number] Class: Historical Med  . Order #: [id number] Class: Normal  . Order #: [id number] Class: Historical Med  . Order #: [id number] Class: Historical Med  . Order #: [id number] Class: Normal  . Order #: [id number] Class: Normal  . Order #: [id number] Class: Historical Med  . Order #: [id number] Class: Normal  . Order #: [id number] Class: Normal  . Order #: [id number] Class: Normal  . Order #: [id number] Class: Historical Med  . Order #: [id number] Class: Historical Med    Allergies Banana and Aspirin  Family History  Problem Relation Age of Onset  . Heart failure Mother   . Stroke Mother   . Heart attack Mother   . Kidney failure Other   . Leukemia Father     Social History Social History   Tobacco Use  . Smoking status: Never Smoker  . Smokeless tobacco: Never Used  Substance Use Topics  . Alcohol use: Not Currently    Comment: occ  . Drug use: No    Review of Systems  All other systems negative except as documented in the HPI. All pertinent positives and negatives as reviewed in the  HPI. ____________________________________________   PHYSICAL EXAM:  VITAL SIGNS: ED Triage Vitals  Enc Vitals Group     BP 09/11/18 0337 (!) 132/94     Pulse Rate 09/11/18 0337 (!) 125     Resp 09/11/18 0337 16     Temp 09/11/18 0337 98.2 F (36.8 C)     Temp Source 09/11/18 0337 Oral     SpO2 09/11/18 0337 100 %     Weight 09/11/18 0333 169 lb (76.7 kg)     Height 09/11/18 0333 5\' 6"  (1.676 m)    Constitutional: Alert and oriented. Ill appearing and in no acute distress. Eyes: Conjunctivae are normal. PERRL. EOMI. Head: Atraumatic. Nose: No  congestion/rhinnorhea. Mouth/Throat: Mucous membranes are moist.  Oropharynx non-erythematous. Neck: No stridor.  No meningeal signs.   Cardiovascular: tachycardic rate, regular rhythm. Good peripheral circulation. Grossly normal heart sounds.   Respiratory: Normal respiratory effort.  No retractions. Lungs CTAB. Gastrointestinal: Soft and nontender. No distention.  Musculoskeletal: No lower extremity tenderness nor edema. No gross deformities of extremities. Neurologic:  Normal speech and language. No gross focal neurologic deficits are appreciated.  Skin:  Skin is warm, dry and intact. No rash noted.  ____________________________________________   LABS (all labs ordered are listed, but only abnormal results are displayed)  Labs Reviewed  CBC WITH DIFFERENTIAL/PLATELET - Abnormal; Notable for the following components:      Result Value   RBC 5.35 (*)    RDW 16.0 (*)    All other components within normal limits  COMPREHENSIVE METABOLIC PANEL - Abnormal; Notable for the following components:   Potassium 2.5 (*)    Total Protein 8.3 (*)    Albumin 3.4 (*)    GFR calc non Af Amer 58 (*)    All other components within normal limits  BASIC METABOLIC PANEL - Abnormal; Notable for the following components:   Potassium 2.9 (*)    All other components within normal limits  BASIC METABOLIC PANEL - Abnormal; Notable for the following components:   Potassium 2.8 (*)    Glucose, Bld 100 (*)    Calcium 8.7 (*)    All other components within normal limits  SURGICAL PCR SCREEN  LIPASE, BLOOD  LACTIC ACID, PLASMA  MAGNESIUM  BASIC METABOLIC PANEL  CBC   ____________________________________________  EKG   EKG Interpretation  Date/Time:  Thursday September 11 2018 04:12:25 EDT Ventricular Rate:  104 PR Interval:    QRS Duration: 74 QT Interval:  375 QTC Calculation: 494 R Axis:   39 Text Interpretation:  Sinus tachycardia Low voltage, precordial leads Anteroseptal infarct, old  Borderline T abnormalities, inferior leads No acute changes Confirmed by Merrily Pew [phone]) on 09/11/2018 6:15:13 AM       ____________________________________________  RADIOLOGY  No results found.  ____________________________________________   PROCEDURES  Procedure(s) performed:   Procedures  CRITICAL CARE Performed by: Merrily Pew Total critical care time: 35 minutes Critical care time was exclusive of separately billable procedures and treating other patients. Critical care was necessary to treat or prevent imminent or life-threatening deterioration. Critical care was time spent personally by me on the following activities: development of treatment plan with patient and/or surrogate as well as nursing, discussions with consultants, evaluation of patient's response to treatment, examination of patient, obtaining history from patient or surrogate, ordering and performing treatments and interventions, ordering and review of laboratory studies, ordering and review of radiographic studies, pulse oximetry and re-evaluation of patient's condition.  ____________________________________________   INITIAL IMPRESSION / ASSESSMENT  AND PLAN / ED COURSE  Likely side effect from chemo. Does seem slightly dry on exam, tachycardic. Will fluid hydrate, check lytes, treat symptomatically in mean time.   reeval a couple hours later, tolerating PO, HR improved, no more emesis. Feels better. Will replete K/Mg, likely discharge.   Pertinent labs & imaging results that were available during my care of the patient were reviewed by me and considered in my medical decision making (see chart for details).  A medical screening exam was performed and I feel the patient has had an appropriate workup for their chief complaint at this time and likelihood of emergent condition existing is low. They have been counseled on decision, discharge, follow up and which symptoms necessitate immediate return to the  emergency department. They or their family verbally stated understanding and agreement with plan and discharged in stable condition.   ____________________________________________  FINAL CLINICAL IMPRESSION(S) / ED DIAGNOSES  Final diagnoses:  Non-intractable vomiting with nausea, unspecified vomiting type  Hypokalemia     MEDICATIONS GIVEN DURING THIS VISIT:  Medications  potassium chloride 10 mEq in 100 mL IVPB (10 mEq Intravenous New Bag/Given 09/11/18 0610)  magnesium sulfate IVPB 2 g 50 mL (2 g Intravenous New Bag/Given 09/11/18 0610)  lactated ringers bolus 1,000 mL (0 mLs Intravenous Stopped 09/11/18 0557)  metoCLOPramide (REGLAN) injection 10 mg (10 mg Intravenous Given 09/11/18 0407)  potassium chloride 20 MEQ/15ML (10%) solution 40 mEq (40 mEq Oral Given 09/11/18 0610)     NEW OUTPATIENT MEDICATIONS STARTED DURING THIS VISIT:  New Prescriptions   No medications on file    Note:  This note was prepared with assistance of Dragon voice recognition software. Occasional wrong-word or sound-a-like substitutions may have occurred due to the inherent limitations of voice recognition software.   Marlene Beidler, Corene Cornea, MD 09/12/18 [phone]

## 2018-09-11 NOTE — ED Notes (Signed)
Pt is scheduled for an EGD tomorrow. Therefore sister wants [place] informed not to administer Lovenox

## 2018-09-11 NOTE — ED Notes (Signed)
Date and time results received: 09/11/18 0534   Test: Potassium Critical Value: 2.5  Name of Provider Notified: Mesner, MD  Orders Received? Or Actions Taken?: Acknowledged.

## 2018-09-11 NOTE — ED Triage Notes (Signed)
Pt c/o emesis x 5 episodes with constant nausea since 2200 last night, pt reports feeling like she has a "lump" in her throat

## 2018-09-11 NOTE — Progress Notes (Signed)
CXR preop ordered.  Curlene Labrum, MD

## 2018-09-12 ENCOUNTER — Encounter (HOSPITAL_COMMUNITY): Payer: Self-pay

## 2018-09-12 ENCOUNTER — Inpatient Hospital Stay (HOSPITAL_COMMUNITY): Admission: RE | Admit: 2018-09-12 | Payer: Medicare HMO | Source: Home / Self Care | Admitting: General Surgery

## 2018-09-12 ENCOUNTER — Other Ambulatory Visit: Payer: Self-pay

## 2018-09-12 ENCOUNTER — Encounter (HOSPITAL_COMMUNITY)
Admission: EM | Disposition: A | Discharge status: Home / Self Care | Payer: Self-pay | Source: Home / Self Care | Attending: Emergency Medicine

## 2018-09-12 ENCOUNTER — Other Ambulatory Visit (HOSPITAL_COMMUNITY): Payer: Self-pay | Admitting: Nurse Practitioner

## 2018-09-12 ENCOUNTER — Observation Stay (HOSPITAL_COMMUNITY): Payer: Medicare HMO | Admitting: Anesthesiology

## 2018-09-12 ENCOUNTER — Encounter (HOSPITAL_COMMUNITY): Payer: Medicare HMO

## 2018-09-12 DIAGNOSIS — R112 Nausea with vomiting, unspecified: Secondary | ICD-10-CM | POA: Diagnosis not present

## 2018-09-12 DIAGNOSIS — E876 Hypokalemia: Secondary | ICD-10-CM | POA: Diagnosis not present

## 2018-09-12 DIAGNOSIS — C3492 Malignant neoplasm of unspecified part of left bronchus or lung: Secondary | ICD-10-CM | POA: Diagnosis not present

## 2018-09-12 DIAGNOSIS — C3490 Malignant neoplasm of unspecified part of unspecified bronchus or lung: Secondary | ICD-10-CM | POA: Diagnosis not present

## 2018-09-12 DIAGNOSIS — C7931 Secondary malignant neoplasm of brain: Secondary | ICD-10-CM | POA: Diagnosis not present

## 2018-09-12 DIAGNOSIS — R1310 Dysphagia, unspecified: Secondary | ICD-10-CM

## 2018-09-12 DIAGNOSIS — I10 Essential (primary) hypertension: Secondary | ICD-10-CM | POA: Diagnosis not present

## 2018-09-12 DIAGNOSIS — Z6825 Body mass index (BMI) 25.0-25.9, adult: Secondary | ICD-10-CM | POA: Diagnosis not present

## 2018-09-12 DIAGNOSIS — C3491 Malignant neoplasm of unspecified part of right bronchus or lung: Secondary | ICD-10-CM

## 2018-09-12 DIAGNOSIS — E43 Unspecified severe protein-calorie malnutrition: Secondary | ICD-10-CM

## 2018-09-12 DIAGNOSIS — Z86718 Personal history of other venous thrombosis and embolism: Secondary | ICD-10-CM | POA: Diagnosis not present

## 2018-09-12 DIAGNOSIS — R634 Abnormal weight loss: Secondary | ICD-10-CM

## 2018-09-12 HISTORY — PX: ESOPHAGOGASTRODUODENOSCOPY (EGD) WITH PROPOFOL: SHX5813

## 2018-09-12 HISTORY — PX: PEG PLACEMENT: SHX5437

## 2018-09-12 LAB — GLUCOSE, CAPILLARY: Glucose-Capillary: 76 mg/dL (ref 70–99)

## 2018-09-12 LAB — BASIC METABOLIC PANEL
Anion gap: 5 (ref 5–15)
BUN: 7 mg/dL — ABNORMAL LOW (ref 8–23)
CO2: 23 mmol/L (ref 22–32)
Calcium: 8.5 mg/dL — ABNORMAL LOW (ref 8.9–10.3)
Chloride: 110 mmol/L (ref 98–111)
Creatinine, Ser: 0.67 mg/dL (ref 0.44–1.00)
GFR calc Af Amer: >60 mL/min (ref >60)
GFR calc non Af Amer: >60 mL/min (ref >60)
Glucose, Bld: 75 mg/dL (ref 70–99)
Potassium: 3.2 mmol/L — ABNORMAL LOW (ref 3.5–5.1)
Sodium: 138 mmol/L (ref 135–145)

## 2018-09-12 LAB — CBC
HCT: 35.5 % — ABNORMAL LOW (ref 36.0–46.0)
Hemoglobin: 11.3 g/dL — ABNORMAL LOW (ref 12.0–15.0)
MCH: 25.8 pg — ABNORMAL LOW (ref 26.0–34.0)
MCHC: 31.8 g/dL (ref 30.0–36.0)
MCV: 81.1 fL (ref 80.0–100.0)
Platelets: 246 10*3/uL (ref 150–400)
RBC: 4.38 MIL/uL (ref 3.87–5.11)
RDW: 15.9 % — ABNORMAL HIGH (ref 11.5–15.5)
WBC: 4.4 10*3/uL (ref 4.0–10.5)
nRBC: 0 % (ref 0.0–0.2)

## 2018-09-12 SURGERY — ESOPHAGOGASTRODUODENOSCOPY (EGD) WITH PROPOFOL
Anesthesia: Monitor Anesthesia Care

## 2018-09-12 MED ORDER — FREE WATER
200.0000 mL | Freq: Four times a day (QID) | Status: DC
  Administered 2018-09-12 – 2018-09-13 (×4): 200 mL

## 2018-09-12 MED ORDER — KETAMINE HCL 10 MG/ML IJ SOLN
INTRAMUSCULAR | Status: DC | PRN
  Administered 2018-09-12: 10 mg via INTRAVENOUS
  Administered 2018-09-12: 5 mg via INTRAVENOUS

## 2018-09-12 MED ORDER — MORPHINE SULFATE (PF) 2 MG/ML IV SOLN
2.0000 mg | INTRAVENOUS | Status: DC | PRN
  Administered 2018-09-12: 22:00:00 2 mg via INTRAVENOUS
  Filled 2018-09-12: qty 1

## 2018-09-12 MED ORDER — SODIUM CHLORIDE 0.9 % IV SOLN: INTRAVENOUS | Status: DC

## 2018-09-12 MED ORDER — HYDROMORPHONE HCL 1 MG/ML IJ SOLN
0.2500 mg | INTRAMUSCULAR | Status: DC | PRN
  Administered 2018-09-12: 0.5 mg via INTRAVENOUS
  Filled 2018-09-12: qty 0.5

## 2018-09-12 MED ORDER — PRO-STAT SUGAR FREE PO LIQD
30.0000 mL | Freq: Two times a day (BID) | ORAL | Status: DC
  Administered 2018-09-12 – 2018-09-13 (×2): 30 mL
  Filled 2018-09-12 (×2): qty 30

## 2018-09-12 MED ORDER — PROPOFOL 500 MG/50ML IV EMUL
INTRAVENOUS | Status: DC | PRN
  Administered 2018-09-12: 150 ug/kg/min via INTRAVENOUS

## 2018-09-12 MED ORDER — HYDROCODONE-ACETAMINOPHEN 7.5-325 MG PO TABS: 1.0000 | ORAL_TABLET | Freq: Once | ORAL | Status: DC | PRN

## 2018-09-12 MED ORDER — MIDAZOLAM HCL 2 MG/2ML IJ SOLN: 0.5000 mg | Freq: Once | INTRAMUSCULAR | Status: DC | PRN

## 2018-09-12 MED ORDER — PROMETHAZINE HCL 25 MG/ML IJ SOLN: 6.2500 mg | INTRAMUSCULAR | Status: DC | PRN

## 2018-09-12 MED ORDER — MIDAZOLAM HCL 5 MG/5ML IJ SOLN
INTRAMUSCULAR | Status: DC | PRN
  Administered 2018-09-12: 2 mg via INTRAVENOUS

## 2018-09-12 MED ORDER — PROPOFOL 10 MG/ML IV BOLUS
INTRAVENOUS | Status: DC | PRN
  Administered 2018-09-12: 10 mg via INTRAVENOUS
  Administered 2018-09-12: 20 mg via INTRAVENOUS

## 2018-09-12 MED ORDER — LIDOCAINE HCL (PF) 1 % IJ SOLN
INTRAMUSCULAR | Status: DC | PRN
  Administered 2018-09-12: 5 mL

## 2018-09-12 MED ORDER — STERILE WATER FOR IRRIGATION IR SOLN
Status: DC | PRN
  Administered 2018-09-12: 1000 mL

## 2018-09-12 MED ORDER — LACTATED RINGERS IV SOLN
INTRAVENOUS | Status: DC
  Administered 2018-09-12: 09:00:00 via INTRAVENOUS

## 2018-09-12 MED ORDER — OSMOLITE 1.5 CAL PO LIQD
240.0000 mL | Freq: Every day | ORAL | Status: DC
  Administered 2018-09-12 – 2018-09-13 (×4): 240 mL
  Filled 2018-09-12 (×15): qty 474

## 2018-09-12 MED ORDER — CEFAZOLIN SODIUM-DEXTROSE 2-4 GM/100ML-% IV SOLN
2.0000 g | Freq: Once | INTRAVENOUS | Status: AC
  Administered 2018-09-12: 2 g via INTRAVENOUS

## 2018-09-12 SURGICAL SUPPLY — 10 items
BINDER ABDOMINAL 12 ML 46-62 (SOFTGOODS) ×3 IMPLANT
GLOVE BIOGEL PI IND STRL 7.0 (GLOVE) ×1 IMPLANT
GLOVE BIOGEL PI INDICATOR 7.0 (GLOVE) ×2
GLOVE SS BIOGEL STRL SZ 7.5 (GLOVE) ×1 IMPLANT
GLOVE SUPERSENSE BIOGEL SZ 7.5 (GLOVE) ×2
KIT PEG SAFETY 20FR (KITS) ×3 IMPLANT
KIT TURNOVER KIT A (KITS) ×3 IMPLANT
NS IRRIG 1000ML POUR BTL (IV SOLUTION) ×3 IMPLANT
PAD ABD 5X9 TENDERSORB (GAUZE/BANDAGES/DRESSINGS) ×3 IMPLANT
TAPE CLOTH SURG 4X10 WHT LF (GAUZE/BANDAGES/DRESSINGS) ×3 IMPLANT

## 2018-09-12 NOTE — Progress Notes (Signed)
Was unable to reach valanda on second attempt. Called other daughter, Kenney Houseman and was able to get in touch. RD relayed all the same information that had been given to patient. Reviewed orders for tube feeding, protein modular, flushes, goal volumes etc. Reiterated all of this information is on handout that was given to patient. RD to follow up remotely next Tuesday.   Burtis Junes RD, LDN, CNSC Clinical Nutrition Available Tues-Sat via Pager: [phone] 09/12/2018 2:49 PM;f

## 2018-09-12 NOTE — Transfer of Care (Signed)
Immediate Anesthesia Transfer of Care Note  Patient: Kaitlin [name]  Procedure(s) Performed: ESOPHAGOGASTRODUODENOSCOPY (EGD) WITH PROPOFOL (scope in 1017, scope out 1032) (N/A ) PERCUTANEOUS ENDOSCOPIC GASTROSTOMY (PEG) PLACEMENT (N/A )  Patient Location: PACU  Anesthesia Type:MAC  Level of Consciousness: awake  Airway & Oxygen Therapy: Patient Spontanous Breathing  Post-op Assessment: Report given to RN  Post vital signs: Reviewed  Last Vitals:  Vitals Value Taken Time  BP 103/74 09/12/2018 10:45 AM  Temp    Pulse 87 09/12/2018 10:48 AM  Resp 15 09/12/2018 10:51 AM  SpO2 100 % 09/12/2018 10:48 AM  Vitals shown include unvalidated device data.  Last Pain:  Vitals:   09/12/18 0830  TempSrc: Oral  PainSc: 0-No pain         Complications: No apparent anesthesia complications

## 2018-09-12 NOTE — Anesthesia Postprocedure Evaluation (Signed)
Anesthesia Post Note  Patient: Margee S [place]ick  Procedure(s) Performed: ESOPHAGOGASTRODUODENOSCOPY (EGD) WITH PROPOFOL (scope in 1017, scope out 1032) (N/A ) PERCUTANEOUS ENDOSCOPIC GASTROSTOMY (PEG) PLACEMENT (N/A )  Patient location during evaluation: PACU Anesthesia Type: MAC Level of consciousness: awake and alert and oriented Pain management: pain level controlled Vital Signs Assessment: post-procedure vital signs reviewed and stable Respiratory status: spontaneous breathing Cardiovascular status: blood pressure returned to baseline and stable Postop Assessment: no apparent nausea or vomiting Anesthetic complications: no     Last Vitals:  Vitals:   09/12/18 0553 09/12/18 0830  BP: 131/83 125/77  Pulse: 76 73  Resp: 16 14  Temp: 36.7 C 36.7 C  SpO2: 100% 99%    Last Pain:  Vitals:   09/12/18 0830  TempSrc: Oral  PainSc: 0-No pain                 Clebert Wenger

## 2018-09-12 NOTE — Care Management Obs Status (Signed)
[place] NOTIFICATION   Patient Details  Name: Kaitlin [name] MRN: [id number] Date of Birth: May 28, 1951   Medicare Observation Status Notification Given:  Yes    Tommy Medal 09/12/2018, 3:52 PM

## 2018-09-12 NOTE — Progress Notes (Signed)
Kaitlin [name] back from surgery, alert and oriented. Kaitlin [name] states that she feels okay and rates her pain a 4 out of 10. Kaitlin [name] states that she remembers Dr. Constance Haw telling her that she could eat around 5pm tonight and possibly go home tomorrow. Abdominal binder is on. Dressing under binder is clean, dry, and intact. Kaitlin [name] offer no complaints at this time.

## 2018-09-12 NOTE — Consult Note (Signed)
[facility] Consult  Reason for Consult: PEG placement  Referring Physician:  Dr. Delton Coombes, Dr. Roderic Palau   Chief Complaint    Emesis      Kaitlin [name] is a 68 y.o. female.  HPI: Kaitlin [name] is a 68 yo known to me with a history of metastatic lung cancer. She had seen me a few months ago for port a catheter placement but due to needing hospitalization and a lung biopsy she had her port a catheter placed by IR. She also has a history of DVT and recurrent PE on anticoagulation, and has been on lovenox shots.  She has been receiving treatment and reports to me that she has finished treatment, and it looks like she finished her palliative radiation therapy 07/29/2018.   No new PET or images have been done to date. She has been having issues with nausea and vomiting and is failing medication management with scopolamine. She has also reported some dysphagia with solid foods at times. Now She says that she can eat some solids and then she has issues with regurgitating liquids. She was admitted to the hospital yesterday after having bilious emesis per her report. She was noted to have a K to 2.5, and due to her getting her PEG I asked the Hospitalist to admit her to get her K up and hold her Lovenox shots for surgery.   She denies ever having any surgery and also reports that she has daily liquid BMs. She has lost a significant amount of weight since I saw her last, about 30 lbs in total.  Her symptoms are consistent with regurgitating liquids and it does not sound like she is having issues with swallowing food. She says she tries to eat some food. She is not regurgitating solid food or regurgitating right after her meal but sometimes an hour or more later.    She has had no recent fevers, chills, cough, SOB.   Past Medical History:  Diagnosis Date  . Asthma   . Cancer ([place]) 04/21/2018   Lung Cancer   . Carpal tunnel syndrome   . Cataract    left eye  . Diabetes mellitus without  complication ([place])   . DVT (deep venous thrombosis) ([place])   . Hypertension   . Pneumonia 04/21/2018  . Pulmonary embolism ([place]) 03/2018    Past Surgical History:  Procedure Laterality Date  . ABDOMINAL HYSTERECTOMY    . biopsy of right breast    . FOOT SURGERY    . implant left eye    . INTRAOCULAR LENS INSERTION      Family History  Problem Relation Age of Onset  . Heart failure Mother   . Stroke Mother   . Heart attack Mother   . Kidney failure Other   . Leukemia Father     Social History   Tobacco Use  . Smoking status: Never Smoker  . Smokeless tobacco: Never Used  Substance Use Topics  . Alcohol use: Not Currently    Comment: occ  . Drug use: No    Medications:  I have reviewed the patient's current medications. Prior to Admission:  Medications Prior to Admission  Medication Sig Dispense Refill Last Dose  . amLODipine (NORVASC) 5 MG tablet Take 5 mg by mouth daily.   09/10/2018 at Unknown time  . chlorthalidone (HYGROTON) 25 MG tablet Take 25 mg by mouth daily.   09/10/2018 at Unknown time  . colchicine 0.6 MG tablet Take 1 tablet (0.6 mg total)  by mouth daily. 15 tablet 0 09/10/2018 at Unknown time  . enoxaparin (LOVENOX) 150 MG/ML injection Inject 150 mg into the skin daily.    09/10/2018 at 1000am  . LORazepam (ATIVAN) 0.5 MG tablet Take 0.5 mg by mouth at bedtime as needed for anxiety.   09/10/2018 at Unknown time  . ondansetron (ZOFRAN ODT) 4 MG disintegrating tablet Take 1 tablet (4 mg total) by mouth every 8 (eight) hours as needed for nausea or vomiting. 30 tablet 2 09/11/2018 at Unknown time  . osimertinib mesylate (TAGRISSO) 80 MG tablet Take 1 tablet (80 mg total) by mouth daily. 30 tablet 1 09/10/2018 at Unknown time  . pantoprazole (PROTONIX) 40 MG tablet Take 40 mg by mouth daily.    09/10/2018 at Unknown time  . potassium chloride (K-DUR) 10 MEQ tablet Take 2 tablets (20 mEq total) by mouth 2 (two) times daily. 120 tablet 2 09/11/2018 at Unknown time  .  prochlorperazine (COMPAZINE) 10 MG tablet Take 1 tablet (10 mg total) by mouth every 6 (six) hours as needed for nausea or vomiting. (Patient taking differently: Take 10 mg by mouth 2 (two) times daily. ) 30 tablet 3 09/10/2018 at Unknown time  . albuterol (PROVENTIL HFA;VENTOLIN HFA) 108 (90 BASE) MCG/ACT inhaler Inhale 2 puffs into the lungs every 6 (six) hours as needed for wheezing.   More than a month at Unknown time  . albuterol (PROVENTIL) (2.5 MG/3ML) 0.083% nebulizer solution Take 3 mLs (2.5 mg total) by nebulization every 6 (six) hours as needed for wheezing or shortness of breath. 75 mL 2 More than a month at Unknown time  . Blood Glucose Monitoring Suppl (TRUE METRIX METER) DEVI 1 Device by Does not apply route daily. (Patient not taking: Reported on 09/11/2018) 1 Device 0 Not Taking at Unknown time  . scopolamine (TRANSDERM-SCOP) 1 MG/3DAYS Place 1 patch (1.5 mg total) onto the skin every 3 (three) days. (Patient not taking: Reported on 09/10/2018) 10 patch 1 Not Taking at Unknown time  . TRUEplus Lancets 30G MISC    Not Taking at Unknown time   Scheduled: . [MAR Hold] amLODipine  5 mg Oral Daily  . [MAR Hold] Chlorhexidine Gluconate Cloth  6 each Topical Once   And  . [MAR Hold] Chlorhexidine Gluconate Cloth  6 each Topical Once  . [MAR Hold] colchicine  0.3 mg Oral Daily  . [MAR Hold] osimertinib mesylate  80 mg Oral Daily  . [MAR Hold] pantoprazole  40 mg Oral Daily  . [MAR Hold] potassium chloride SA  20 mEq Oral BID   Continuous: . sodium chloride    . 0.9 % NaCl with KCl 40 mEq / L 75 mL/hr (09/12/18 0556)  . lactated ringers     PRN:[MAR Hold] acetaminophen **OR** [MAR Hold] acetaminophen, [MAR Hold] albuterol, [MAR Hold] LORazepam, [MAR Hold] ondansetron **OR** [MAR Hold] ondansetron (ZOFRAN) IV  Allergies  Allergen Reactions  . Banana Anaphylaxis  . Aspirin Other (See Comments)    G.I. Upset      ROS:  A comprehensive review of systems was negative except for:  Constitutional: positive for weight loss Gastrointestinal: positive for nausea and vomiting  Blood pressure 125/77, pulse 73, temperature 98.1 F (36.7 C), temperature source Oral, resp. rate 14, height 5\' 6"  (1.676 m), weight 72.5 kg, SpO2 99 %. Physical Exam Vitals signs reviewed.  Constitutional:      Appearance: Normal appearance. She is cachectic.  HENT:     Head: Normocephalic.     Nose: Nose  normal.     Mouth/Throat:     Mouth: Mucous membranes are moist.  Eyes:     Pupils: Pupils are equal, round, and reactive to light.  Neck:     Musculoskeletal: Normal range of motion.  Cardiovascular:     Rate and Rhythm: Normal rate.  Pulmonary:     Effort: Pulmonary effort is normal.  Abdominal:     General: There is no distension.     Palpations: Abdomen is soft.     Tenderness: There is no abdominal tenderness.  Musculoskeletal:        General: No swelling.  Skin:    General: Skin is warm and dry.  Neurological:     General: No focal deficit present.     Mental Status: She is alert and oriented to person, place, and time.  Psychiatric:        Mood and Affect: Mood normal.        Behavior: Behavior normal.        Thought Content: Thought content normal.        Judgment: Judgment normal.     Results: Results for orders placed or performed during the hospital encounter of 09/11/18 (from the past 48 hour(s))  CBC with Differential     Status: Abnormal   Collection Time: 09/11/18  3:40 AM  Result Value Ref Range   WBC 6.0 4.0 - 10.5 K/uL   RBC 5.35 (H) 3.87 - 5.11 MIL/uL   Hemoglobin 13.9 12.0 - 15.0 g/dL   HCT 42.9 36.0 - 46.0 %   MCV 80.2 80.0 - 100.0 fL   MCH 26.0 26.0 - 34.0 pg   MCHC 32.4 30.0 - 36.0 g/dL   RDW 16.0 (H) 11.5 - 15.5 %   Platelets 346 150 - 400 K/uL   nRBC 0.0 0.0 - 0.2 %   Neutrophils Relative % 65 %   Neutro Abs 3.9 1.7 - 7.7 K/uL   Lymphocytes Relative 18 %   Lymphs Abs 1.1 0.7 - 4.0 K/uL   Monocytes Relative 13 %   Monocytes Absolute 0.8  0.1 - 1.0 K/uL   Eosinophils Relative 2 %   Eosinophils Absolute 0.1 0.0 - 0.5 K/uL   Basophils Relative 1 %   Basophils Absolute 0.0 0.0 - 0.1 K/uL   Immature Granulocytes 1 %   Abs Immature Granulocytes 0.05 0.00 - 0.07 K/uL    Comment: Performed at [facility], [address], [place], [place] [address]  Comprehensive metabolic panel     Status: Abnormal   Collection Time: 09/11/18  4:05 AM  Result Value Ref Range   Sodium 136 135 - 145 mmol/L   Potassium 2.5 (LL) 3.5 - 5.1 mmol/L    Comment: CRITICAL RESULT CALLED TO, READ BACK BY AND VERIFIED WITH: WALKER,T@[id number]  BY MATTHEWS, B 4.16.20    Chloride 98 98 - 111 mmol/L   CO2 25 22 - 32 mmol/L   Glucose, Bld 91 70 - 99 mg/dL   BUN 12 8 - 23 mg/dL   Creatinine, Ser 1.00 0.44 - 1.00 mg/dL   Calcium 9.7 8.9 - 10.3 mg/dL   Total Protein 8.3 (H) 6.5 - 8.1 g/dL   Albumin 3.4 (L) 3.5 - 5.0 g/dL   AST 15 15 - 41 U/L   ALT 13 0 - 44 U/L   Alkaline Phosphatase 67 38 - 126 U/L   Total Bilirubin 0.5 0.3 - 1.2 mg/dL   GFR calc non Af Amer 58 (L) >60 mL/min   GFR  calc Af Amer >60 >60 mL/min   Anion gap 13 5 - 15    Comment: Performed at [facility], [address] [place], [place] [address]  Lipase, blood     Status: None   Collection Time: 09/11/18  4:05 AM  Result Value Ref Range   Lipase 37 11 - 51 U/L    Comment: Performed at [facility], [address] [place], [place] [address]  Magnesium     Status: None   Collection Time: 09/11/18  4:05 AM  Result Value Ref Range   Magnesium 2.0 1.7 - 2.4 mg/dL    Comment: Performed at [facility], [address], [place], [place] [address]  Lactic acid, plasma     Status: None   Collection Time: 09/11/18  4:28 AM  Result Value Ref Range   Lactic Acid, Venous 1.1 0.5 - 1.9 mmol/L    Comment: Performed at [facility], [address], [place], [place] [address]  Basic metabolic panel     Status: Abnormal   Collection Time: 09/11/18  8:44 AM  Result Value Ref Range   Sodium 138 135 - 145  mmol/L   Potassium 2.9 (L) 3.5 - 5.1 mmol/L   Chloride 103 98 - 111 mmol/L   CO2 25 22 - 32 mmol/L   Glucose, Bld 89 70 - 99 mg/dL   BUN 10 8 - 23 mg/dL   Creatinine, Ser 0.84 0.44 - 1.00 mg/dL   Calcium 9.1 8.9 - 10.3 mg/dL   GFR calc non Af Amer >60 >60 mL/min   GFR calc Af Amer >60 >60 mL/min   Anion gap 10 5 - 15    Comment: Performed at [facility], [address], [place], [place] [address]  Basic metabolic panel     Status: Abnormal   Collection Time: 09/11/18  4:55 PM  Result Value Ref Range   Sodium 137 135 - 145 mmol/L   Potassium 2.8 (L) 3.5 - 5.1 mmol/L   Chloride 105 98 - 111 mmol/L   CO2 24 22 - 32 mmol/L   Glucose, Bld 100 (H) 70 - 99 mg/dL   BUN 9 8 - 23 mg/dL   Creatinine, Ser 0.72 0.44 - 1.00 mg/dL   Calcium 8.7 (L) 8.9 - 10.3 mg/dL   GFR calc non Af Amer >60 >60 mL/min   GFR calc Af Amer >60 >60 mL/min   Anion gap 8 5 - 15    Comment: Performed at [facility], [address], [place], [place] [address]  Surgical pcr screen     Status: None   Collection Time: 09/11/18  5:46 PM  Result Value Ref Range   MRSA, PCR NEGATIVE NEGATIVE   Staphylococcus aureus NEGATIVE NEGATIVE    Comment: (NOTE) The Xpert SA Assay (FDA approved for NASAL specimens in patients 9 years of age and older), is one component of a comprehensive surveillance program. It is not intended to diagnose infection nor to guide or monitor treatment. Performed at [facility], [address], [place],  [address]   Basic metabolic panel     Status: Abnormal   Collection Time: 09/12/18  5:07 AM  Result Value Ref Range   Sodium 138 135 - 145 mmol/L   Potassium 3.2 (L) 3.5 - 5.1 mmol/L   Chloride 110 98 - 111 mmol/L   CO2 23 22 - 32 mmol/L   Glucose, Bld 75 70 - 99 mg/dL   BUN 7 (L) 8 - 23 mg/dL   Creatinine,  Ser 0.67 0.44 - 1.00 mg/dL   Calcium 8.5 (L) 8.9 - 10.3 mg/dL   GFR calc non Af Amer >60 >60 mL/min   GFR calc Af Amer >60 >60 mL/min   Anion gap 5 5 - 15    Comment: Performed  at [facility], [address], [place], [place] [address]  CBC     Status: Abnormal   Collection Time: 09/12/18  5:07 AM  Result Value Ref Range   WBC 4.4 4.0 - 10.5 K/uL   RBC 4.38 3.87 - 5.11 MIL/uL   Hemoglobin 11.3 (L) 12.0 - 15.0 g/dL   HCT 35.5 (L) 36.0 - 46.0 %   MCV 81.1 80.0 - 100.0 fL   MCH 25.8 (L) 26.0 - 34.0 pg   MCHC 31.8 30.0 - 36.0 g/dL   RDW 15.9 (H) 11.5 - 15.5 %   Platelets 246 150 - 400 K/uL   nRBC 0.0 0.0 - 0.2 %    Comment: Performed at [facility], [address], [place], [place] [address]   Personally reviewed- no patchy infiltrates, appreciate the improving opacity on the right from prior CXR   Reviewed prior CT/ PET- has some thickening of the distal esophagus with lymph nodes adjacent, Not lighting up on PET  Dg Chest Port 1 View  Result Date: 09/11/2018 CLINICAL DATA:  Chest pain. EXAM: PORTABLE CHEST 1 VIEW COMPARISON:  Radiographs of July 06, 2018. CT scan of July 10, 2018. FINDINGS: The heart size and mediastinal contours are within normal limits. No pneumothorax is noted. Left lung is clear. Right basilar opacity is improved compared to prior exam with minimal right pleural effusion present. The visualized skeletal structures are unremarkable. IMPRESSION: Significantly decreased right basilar opacity is noted consistent with improving pneumonia or atelectasis. Minimal right pleural effusion may be present. Electronically Signed   By: Marijo Conception M.D.   On: 09/11/2018 09:55    Assessment & Plan:  KIMARIE COOR is a 68 y.o. female with metastatic lung cancer and nausea/vomiting and weight loss. Some of her symptoms make me worry that she could continue to have nausea and vomiting after PEG placement given that she is vomiting bile and having vomiting more than 1 hour after her meals. She is not regurgitating solid foods.  I think we can start with a PEG placement as she has never had any surgeries or reasons for having any obstruction and is having  diarrhea daily.  I have discussed that some of her symptoms may not resolve with the PEG but that the PEG will allow for feeds to be given to help with nutrition given her 30 lbs of weight loss.     We discussed that she may ultimately need conversion to a GJ by IR if she continues to have symptoms but that even that may not help as her symptoms could be from the brain metastasis.    We discussed the risk of PEG placement including but not limited to bleeding, infection, risk of injury to other organs, risk that we abort if the find something concerning in her esophagus given the distal thickening from prior images.    We discussed that this has to stay in for at least 8 weeks to allow it to heal, and that if it is removed prior to that time that it could require an emergency surgery.   Will place a Palliative PEG tube for metastatic lung cancer to the brain in the setting of nausea/ vomiting, weight loss and  some reported dysphagia.    Kaitlin [name] 09/12/2018, 9:08 AM

## 2018-09-12 NOTE — Discharge Instructions (Signed)
PEG Care (Feeding Tube Care) -Flush the tube twice a day with 20cc of water if not using the tube for feeds.  -Make sure the top of your bumper remains at 4.5 at the top of the bumper and 3.5 at the skin level. It is important to keep it at this mark so that the tube is not too tight or too loose.  -Keep the tube taped down when not in use or with activity. -Wear an abdominal binder, or ace wrap around your waste to keep the tube in place, especially with sleeping.  -Follow your feeding instructions per your home health care RN.  -If your tube falls out or gets pulled out, go to the ED immediately. If it is within 6-8 weeks, there is a possibility that you will need surgery.   After 6-8 weeks, a new tube can be reinserted, but has to be done quickly because the hole heals up fast.  -You may shower and dry the area and tube off after showering. DO NOT SUBMERGE IN A BATH.  Follow up with the [facility] [place]. Follow up with Dr. Constance Haw if having issues or concerns, or after 8 weeks to remove the tube.    PEG Tube Home Guide A PEG tube is used to put food and fluids into the stomach. Before you leave the hospital, make sure that you know:  How to care for your PEG tube.  How to care for the opening (stoma) in your belly.  How to give yourself a feeding.  How to give yourself medicines.  When to call your doctor for help. Supplies needed:  [employer], plain water  Clean washcloth  Gauze pad  Syringe How to care for a PEG tube Check your PEG tube every day. Make sure:  It is not too tight.  It is in the correct place. There is a mark on the tube that shows when the tube is in the correct place. Adjust the tube if you need to. Cleaning your stoma Clean your stoma every day. Follow these steps: 1. Wash your hands with soap and water. If soap and water are not available, use hand sanitizer. 2. Check your stoma. Let your doctor know if there  is: ? Redness. ? Leaking. ? Skin irritation. 3. Wash the stoma gently with warm, soapy water. 4. Rinse the stoma with plain water. 5. Pat the stoma area dry. 6. You may place a gauze pad around the opening of your stoma.  Giving a feeding Your doctor will tell you:  How much nutrition and fluid you will need for each feeding.  How often to have a feeding.  Whether you should take medicine in the tube by itself or with a feeding. To give yourself a feeding, follow these steps: 1. Lay out all of the things that you will need. 2. Make sure that the nutritional formula is at room temperature. 3. Wash your hands with soap and water. 4. Sit up or stand up straight. You will need to stay sitting up or standing up while you give yourself a feeding. 5. Make sure the syringe plunger is pushed in. Place the tip of the syringe in clean water, and slowly pull the plunger to bring (draw up) the water into the syringe. 6. Remove the clamp and the cap from the PEG tube. 7. Push the water out of the syringe to clean (flush) the tube. 8. If the tube is clear, draw up the  formula into the syringe. Make sure to use the right amount for each feeding. Add water if you need to. 9. Slowly push the formula from the syringe through the tube. 10. After the feeding, flush the tube with water. 11. Put the clamp and the cap on the tube. 12. Stay sitting up or standing up straight for at least 30 minutes. Giving medicine To give yourself medicine, follow these steps: 1. Lay out all of the things that you will need. 2. If your medicine is in tablet form, crush it and dissolve it in water. 3. Wash your hands with soap and water. 4. Sit up or stand up straight. You will need to stay sitting up or standing up while you give yourself medicine. 5. Make sure the syringe plunger is pushed in. Place the tip of the syringe in clean water, and slowly pull the plunger to bring (draw up) the water into the  syringe. 6. Remove the clamp and the cap from the PEG tube. 7. Push the water out of the syringe to clean (flush) the tube. 8. If the tube is clear, draw up the medicine into the syringe. 9. Slowly push the medicine from the syringe through the tube. 10. Flush the tube with water. 11. Put the clamp and the cap on the tube. 12. Stay sitting up or standing up straight for at least 30 minutes. Do not take sustained release (SR) medicines through your tube. If you are not sure if your medicine is a SR medicine, ask your doctor. Contact a doctor if:  The area around your stoma is sore, irritated, or red.  You have belly pain or bloating while you are feeding, or after you feed.  You feel sick to your stomach (nausea) for a long time.  You cannot poop (constipated) or you have watery poop (diarrhea) for a long time.  You have a fever.  You have problems with your PEG tube. Get help right away if:  Your tube is blocked.  Your tube falls out.  You have pain around your tube.  You are bleeding from your tube.  Your tube is leaking.  You choke or you have trouble breathing while you are feeding or after you feed. Summary  A PEG tube is used to put food and fluids into the stomach.  Before you leave the hospital, you will be taught how to use and care for your PEG tube.  Your doctor will give you instructions on how to give yourself feedings and medicines.  Contact your doctor if you have fever or soreness, redness, or irritation around your stoma.  Get help right away if your tube leaks, is blocked, or falls out. Also, get help right away if you have pain or bleeding around your stoma. This information is not intended to replace advice given to you by your health care provider. Make sure you discuss any questions you have with your health care provider. Document Released: 06/16/2010 Document Revised: 05/27/2017 Document Reviewed: 05/27/2017 Elsevier Interactive Patient Education   2019 [employer].

## 2018-09-12 NOTE — Progress Notes (Signed)
Patient Demographics:    Kaitlin [name], is a 68 y.o. female, DOB - 20-Dec-1950, LKG:[id number]  Admit date - 09/11/2018   Admitting Physician Erick Blinks, MD  Outpatient Primary MD for the patient is [name], MD  LOS - 0   Chief Complaint  Patient presents with  . Emesis        Subjective:    Kaitlin [name] today has no fevers,    No chest pain, hungry, patient has nausea but no further emesis  Assessment  & Plan :    Active Problems:   Hypertension   Pulmonary embolism during treatment with long-term anticoagulation therapy (HCC)   Adenocarcinoma, lung, left (HCC)   Brain metastases (HCC)   Hypokalemia   Nausea and vomiting   Weight loss  Brief Summary  68 y.o. female with medical history significant of lung cancer with brain mets, hypertension, venous thromboembolism on anticoagulation admitted on 09/11/2018 with persistent nausea vomiting and severe protein caloric malnutrition/failure to thrive--- needs PEG tube for feeding purposes   A/p  1)FTT/severe protein caloric malnutrition--- intractable emesis, status post PEG tube placement on 09/12/2018, nutritionist/dietitian consult appreciated, start Osmolite tube feed  2)Adenocarcinoma of the left lung with brain mets--- patient will continue to follow-up with oncology as outpatient post discharge . 3)Recent history of pulmonary embolism: Lovenox on hold to allow for PEG tube placement on 09/12/2018, restart Lovenox per general surgeon on 09/13/2018  at 150 mg daily  4)Hypertension:   stable, restart amlodipine 5 mg daily  History of gout: Stable, may resume home colchicine on discharge    DVT prophylaxis: SCDs (resume Lovenox on 09/13/2018) Code Status: DNR Family Communication: Discussed with sister over the phone Disposition Plan: Discharge home after PEG tube placement Consults called: General surgery   Disposition/Need for  in-Hospital Stay- patient unable to be discharged at this time due to dementia patient is tolerating PEG tube feeding well PEG tube feeding to be started 09/12/2018 possible discharge home on 09/13/2018  Lab Results  Component Value Date   PLT 246 09/12/2018    Inpatient Medications  Scheduled Meds: . amLODipine  5 mg Oral Daily  . Chlorhexidine Gluconate Cloth  6 each Topical Once   And  . Chlorhexidine Gluconate Cloth  6 each Topical Once  . colchicine  0.3 mg Oral Daily  . osimertinib mesylate  80 mg Oral Daily  . pantoprazole  40 mg Oral Daily  . potassium chloride SA  20 mEq Oral BID   Continuous Infusions: . 0.9 % NaCl with KCl 40 mEq / L 75 mL/hr (09/12/18 0556)   PRN Meds:.acetaminophen **OR** acetaminophen, albuterol, LORazepam, ondansetron **OR** ondansetron (ZOFRAN) IV    Anti-infectives (From admission, onward)   Start     Dose/Rate Route Frequency Ordered Stop   09/11/18 1115  ceFAZolin (ANCEF) IVPB 2g/100 mL premix     2 g 200 mL/hr over 30 Minutes Intravenous On call to O.R. 09/11/18 1102 09/12/18 0559        Objective:   Vitals:   09/11/18 1300 09/11/18 2001 09/11/18 2101 09/12/18 0553  BP: 115/85  107/73 131/83  Pulse: 78  88 76  Resp: 16  16 16   Temp: 97.9 F (36.6 C)  98.1 F (36.7 C) 98.1 F (  36.7 C)  TempSrc: Oral  Oral Oral  SpO2: 100% 98% 99% 100%  Weight:      Height:        Wt Readings from Last 3 Encounters:  09/11/18 72.5 kg  08/26/18 76.6 kg  08/15/18 76.2 kg     Intake/Output Summary (Last 24 hours) at 09/12/2018 0751 Last data filed at 09/11/2018 1700 Gross per 24 hour  Intake 470.42 ml  Output -  Net 470.42 ml     Physical Exam Patient is examined daily including today on 09/13/18 , exams remain the same as of yesterday except that has changed   Gen:- Awake Alert, resting comfortably HEENT:- [place].AT, No sclera icterus Neck-Supple Neck,No JVD,.  Lungs-diminished in bases, no wheezing  CV- S1, S2 normal, regular  Abd-   +ve B.Sounds, Abd Soft, No tenderness, PEG tube site intact Extremity/Skin:- No  edema, pedal pulses present  Psych-affect is appropriate, oriented x3 Neuro-no new focal deficits, no tremors   Data Review:   Micro Results Recent Results (from the past 240 hour(s))  Surgical pcr screen     Status: None   Collection Time: 09/11/18  5:46 PM  Result Value Ref Range Status   MRSA, PCR NEGATIVE NEGATIVE Final   Staphylococcus aureus NEGATIVE NEGATIVE Final    Comment: (NOTE) The Xpert SA Assay (FDA approved for NASAL specimens in patients 77 years of age and older), is one component of a comprehensive surveillance program. It is not intended to diagnose infection nor to guide or monitor treatment. Performed at [facility], [address] [place], [place] [address]     Radiology Reports Dg Chest [place] 1 View  Result Date: 09/11/2018 CLINICAL DATA:  Chest pain. EXAM: PORTABLE CHEST 1 VIEW COMPARISON:  Radiographs of July 06, 2018. CT scan of July 10, 2018. FINDINGS: The heart size and mediastinal contours are within normal limits. No pneumothorax is noted. Left lung is clear. Right basilar opacity is improved compared to prior exam with minimal right pleural effusion present. The visualized skeletal structures are unremarkable. IMPRESSION: Significantly decreased right basilar opacity is noted consistent with improving pneumonia or atelectasis. Minimal right pleural effusion may be present. Electronically Signed   By: Lupita Raider M.D.   On: 09/11/2018 09:55     CBC Recent Labs  Lab 09/09/18 1013 09/11/18 0340 09/12/18 0507  WBC 7.8 6.0 4.4  HGB 13.0 13.9 11.3*  HCT 40.4 42.9 35.5*  PLT 294 346 246  MCV 80.0 80.2 81.1  MCH 25.7* 26.0 25.8*  MCHC 32.2 32.4 31.8  RDW 16.1* 16.0* 15.9*  LYMPHSABS 1.1 1.1  --   MONOABS 0.9 0.8  --   EOSABS 0.1 0.1  --   BASOSABS 0.0 0.0  --     Chemistries  Recent Labs  Lab 09/09/18 1013 09/11/18 0405 09/11/18 0844 09/11/18 1655  09/12/18 0507  NA 135 136 138 137 138  K 2.5* 2.5* 2.9* 2.8* 3.2*  CL 97* 98 103 105 110  CO2 26 25 25 24 23   GLUCOSE 101* 91 89 100* 75  BUN 15 12 10 9  7*  CREATININE 1.03* 1.00 0.84 0.72 0.67  CALCIUM 9.2 9.7 9.1 8.7* 8.5*  MG 1.6* 2.0  --   --   --   AST 15 15  --   --   --   ALT 11 13  --   --   --   ALKPHOS 66 67  --   --   --   BILITOT  0.5 0.5  --   --   --    ------------------------------------------------------------------------------------------------------------------ No results for input(s): CHOL, HDL, LDLCALC, TRIG, CHOLHDL, LDLDIRECT in the last 72 hours.  Lab Results  Component Value Date   HGBA1C 6.5 (H) 07/06/2018   ------------------------------------------------------------------------------------------------------------------ No results for input(s): TSH, T4TOTAL, T3FREE, THYROIDAB in the last 72 hours.  Invalid input(s): FREET3 ------------------------------------------------------------------------------------------------------------------ No results for input(s): VITAMINB12, FOLATE, FERRITIN, TIBC, IRON, RETICCTPCT in the last 72 hours.  Coagulation profile No results for input(s): INR, PROTIME in the last 168 hours.  No results for input(s): DDIMER in the last 72 hours.  Cardiac Enzymes No results for input(s): CKMB, TROPONINI, MYOGLOBIN in the last 168 hours.  Invalid input(s): CK ------------------------------------------------------------------------------------------------------------------    Component Value Date/Time   BNP 39.7 07/06/2018 1239     Diya Gervasi M.D on 09/12/2018 at 7:51 AM  Go to [email or web address] - for contact info  [facility] - Office  [phone]

## 2018-09-12 NOTE — Progress Notes (Signed)
Initial Nutrition Assessment  DOCUMENTATION CODES:  Severe malnutrition in context of chronic illness  INTERVENTION:  Initiate TF via PEG with Osmolite 1.5. 1 can 5x/day. Flush w/ 30 ml before and after each bolus. +Prostat 30 ml BID/day.  [ TF will provide: 1975 kcals, 105g Pro, 905 ml fluid (+300 from flushes with boluses ]  To meet rest of fluid needs, add 200cc free water q4 hrs.   On d/c, prostat will be changed to Promod, which will lessen total protein provided to 95g pro- still will meet needs.   Given her months of minimal oral intake, RD has concerns of electrolyte shifts occurring with refeeding. Will begin with lower volume feeds- 50% bolus volume until tolerance established  NUTRITION DIAGNOSIS:  Severe Malnutrition(Chronic context) related to cancer and cancer related treatments, nausea as evidenced by a loss of nearly 20% bw in <6 months  GOAL:  Patient will meet greater than or equal to 90% of their needs  MONITOR:  PO intake, Supplement acceptance, I & O's, Labs, Weight trends  REASON FOR ASSESSMENT:  Consult Enteral/tube feeding initiation and management, Assessment of nutrition requirement/status  ASSESSMENT:  68 y/o female  HTN, DM2, Stage IV adenocarcinoma of lung w/ brain mets s/p WBRT 3/3. Begun oral chemo 3/2. D/t intractable nausea and resultant FTT, pt was referred for OP PEG w/ placement scheduled 4/17. On 4/16, pt presented to ED w/ continued emesis. Admitted for rehydration prior to PEG on 4/17.   RD is well familiar with pt as she is followed by RD team at [facility]. She has lost a troubling amount of weight over the past couple months d/t relentless nausea which has been refractory to numerous antiemetics- most recently scopolamine patch. Her nausea has made it difficult for the pt to tolerate almost any PO intake. Based on her last diet recall, RD estimated her oral intake as adding up to <500 kcals/day. Per discussion w/ care team, nausea etiology thought to  be either esophageal compression by cancer vs latent side effect of wbrt.   Prior to presentation to hospital, her most recent weight was from her cancer center appt on 3/31 and this was 168.8 lbs. Her current wt (159.8 lbs) reflects a loss of another 9 lbs in the last 2.5 weeks. In total, she has lost 39 lbs since her diagnosis at the End of last November. This equates to a loss of 19.6% bw in <6 months.    Given her months of minimal oral intake, RD has concerns of electrolyte shifts occurring with refeeding. Will also likely only be able to tolerate small amounts initially.   Given her scant oral intake, Will construct regimen to meet 100% of kcal/pro needs.   RD will meet with patient once returns from PEG placement to give educational handout on bolus administration with details of her specific regimen listed.   Labs: K:2.8-> 3.2, Hgb:11.3 Meds: PPI, KCL, tagrisso, KCL, IVF,   Recent Labs  Lab 09/09/18 1013 09/11/18 0405 09/11/18 0844 09/11/18 1655 09/12/18 0507  NA 135 136 138 137 138  K 2.5* 2.5* 2.9* 2.8* 3.2*  CL 97* 98 103 105 110  CO2 26 25 25 24 23   BUN 15 12 10 9  7*  CREATININE 1.03* 1.00 0.84 0.72 0.67  CALCIUM 9.2 9.7 9.1 8.7* 8.5*  MG 1.6* 2.0  --   --   --   GLUCOSE 101* 91 89 100* 75   NUTRITION - FOCUSED PHYSICAL EXAM: Deferred at this time.   Diet  Order:   Diet Order            Diet NPO time specified  Diet effective midnight             EDUCATION NEEDS:  Education needs have been addressed  Skin:  Skin Assessment: Reviewed RN Assessment  Last BM:  4/16 (reportedly had watery stools at home prior to presenting)  Height:  Ht Readings from Last 1 Encounters:  09/11/18 5\' 6"  (1.676 m)   Weight:  Wt Readings from Last 1 Encounters:  09/11/18 72.5 kg   Wt Readings from Last 10 Encounters:  09/11/18 72.5 kg  08/26/18 76.6 kg  08/15/18 76.2 kg  08/06/18 76.9 kg  08/05/18 78.9 kg  08/05/18 77.2 kg  07/15/18 84 kg  07/06/18 86.1 kg   07/03/18 85.4 kg  06/26/18 84.4 kg   Ideal Body Weight:  59.1 kg  BMI:  Body mass index is 25.8 kg/m.  Estimated Nutritional Needs:  Kcal:  1900-2100 kcals (26-29 kcal/kg bw) Protein:  87-102g Pro (1.2-1.4g/kg bw) Fluid:  1.9-2.1 L fluid ( 30ml/kcal)  Burtis Junes RD, LDN, CNSC Clinical Nutrition Available Tues-Sat via Pager: [phone] 09/12/2018 9:28 AM

## 2018-09-12 NOTE — Progress Notes (Signed)
[facility]  PEG placed. At the skin at 3.5 and top of bumper at 4.5 mark.    Patient can resume feeds and diet at 5pm. Nutrition aware of patient. Patient will remain overnight and start feeds. Can be discharged tomorrow.   Oncology is suppose to be working on [facility] for the Patient and they are aware she is inpatient right now.   Updated Dr. Delton Coombes that no compression of the esophagus was seen and that she may still have some symptoms.   Can restart therapeutic lovenox tomorrow.   Patient's family has been notified of the feeding tube.  Patient can follow up with Oncology. She does not need to follow up with me at this time, and can be scheduled later to follow up to remove the PEG or if there are any issues.   Curlene Labrum, MD [facility] [address] [place], [place] [address] [phone] (office)

## 2018-09-12 NOTE — Progress Notes (Signed)
Met and discussed TF regimen/tiration with pt. Reviewed suggested tube feeding schedule and management of free water flushes.   She voices desire to continue to eat/drink by mouth as able. To allow this comfort, RD directed that if she is able to eat ~50% of a meal, she could hold a TF bolus.   She reports being able to readily drink select fluids. She says she cannot drink water, but will drink Pedialyte, gatorade and fruit punch. RD explained how her TF regimen alone does not provide sufficient fluids. She needs approximately an extra 800 cc (26 oz fluid) to meet 100% needs. If she is not able to consume this orally, she needs to administer free water boluses.   All instructions handwritten on handouts and left with her at bedside. RD left his card w/ contact information. Pt will not be one administering her own feeds. RD asked who he should contact to review TF regimen. She states her daughter, "Kaitlin [name]" would be best contact for TF information. RD tried to reach by phone-unsuccessful x1. Will retry later this afternoon.   RD placed orders to initiate feeds, prostat and flushes at 5 pm. Instructed to only administer 4 oz her first feed. Depending on how tolerates, can increase to the 8 oz next administration,   Burtis Junes RD, LDN, CNSC Clinical Nutrition Available Tues-Sat via Pager: [phone] 09/12/2018 1:57 PM

## 2018-09-12 NOTE — Anesthesia Preprocedure Evaluation (Signed)
Anesthesia Evaluation  Patient identified by MRN, date of birth, ID band Patient awake    Reviewed: Allergy & Precautions, NPO status , Patient's Chart, lab work & pertinent test results  Airway Mallampati: II  TM Distance: >3 FB Neck ROM: Full    Dental no notable dental hx. (+) Missing   Pulmonary asthma , pneumonia, unresolved,  States no recent inhaler use    Pulmonary exam normal breath sounds clear to auscultation       Cardiovascular Exercise Tolerance: Poor hypertension, Pt. on medications negative cardio ROS Normal cardiovascular examII Rhythm:Regular Rate:Normal     Neuro/Psych  Neuromuscular disease negative psych ROS   GI/Hepatic Neg liver ROS, GERD  Medicated and Controlled,Denies any GERD Sx today    Endo/Other  negative endocrine ROSdiabetes, Type 2  Renal/GU negative Renal ROS  negative genitourinary   Musculoskeletal negative musculoskeletal ROS (+)   Abdominal   Peds negative pediatric ROS (+)  Hematology negative hematology ROS (+)   Anesthesia Other Findings Pt with known metastatic lung Ca - with Brain mets and weight loss - here for EGD-poss PEG  Reproductive/Obstetrics negative OB ROS                             Anesthesia Physical Anesthesia Plan  ASA: IV  Anesthesia Plan: MAC   Post-op Pain Management:    Induction: Intravenous  PONV Risk Score and Plan:   Airway Management Planned: Nasal Cannula and Simple Face Mask  Additional Equipment:   Intra-op Plan:   Post-operative Plan:   Informed Consent: I have reviewed the patients History and Physical, chart, labs and discussed the procedure including the risks, benefits and alternatives for the proposed anesthesia with the patient or authorized representative who has indicated his/her understanding and acceptance.     Dental advisory given  Plan Discussed with: CRNA  Anesthesia Plan Comments:  (Full PPE use planned  Will try without ETT, PRN GETA as needed d/w pt -WTP with same )        Anesthesia Quick Evaluation

## 2018-09-12 NOTE — Op Note (Addendum)
[facility] Operative Note  09/12/18  Preoperative Diagnosis:  Metastatic Lung Cancer with Nausea, Vomiting, Dysphagia, Weight loss    Postoperative Diagnosis: Same   Procedure(s) Performed: Palliative Percutaneous Endoscopic Gastrostomy Tube   Surgeon: Lanell Matar. Constance Haw, MD   Assistants: Aviva Signs, MD     Anesthesia: Monitored anesthesia care   Anesthesiologist: Lenice Llamas, MD    Estimated Blood Loss: Minimal   Complications: None   Wound Class: Clean contaminated   Operative Indications: The patient is a 68 year old female with metastatic lung cancer with brain metastasis. She has received palliative radiation treatment and has been having some symptoms of dysphagia as well as nausea/vomiting with a 30+ lb weight loss in the last few months. She did have some lymph nodes that were adjacent to the esophagus that could be causing some degree of compression based on her prior imaging.  Dr. Delton Coombes has asked for PEG tube to be placed as she needs prolonged enteral nutrition because of the inability to keep her nutrition up with oral intake. Her symptoms of nausea and vomiting were discussed with the patient and I informed her that these may not improve with the PEG placement as she could still have these symptoms from her brain metastasis with feeds into her stomach.  She may ultimately require changing over to a GJ tube in the future but that this may not even improve the symptoms.  We discussed the risk and benefits of the procedure including but not limited to bleeding, infection, risk of injury to the esophagus or stomach or other organ, and need for possible open gastrostomy tube.   Findings:  Thick upper abdominal wall, PEG at 3.5 at the skin and 4.5 at the top of the bumper; normal distal esophagus without any signs of compression distally or narrowing, normal stomach and duodenum, pylorus open   Procedure: The patient was taken to the operating room  and placed supine. Monitored anesthesia care was induced. Intravenous antibiotics were administered per protocol.  A JACHO approved time out was performed.  After adequate sedation and positioning, a mouth piece was placed into the patient's mouth and the endoscope was passed down into the stomach. No abnormalities were noted as far as any compression of the esophagus or cause for dysphagia.  The duodenum was intubated and noted to have no gross abnormalities. The pylorus was open.  The stomach was insufflated with air and the endoscope was positioned in the midportion and directed to the anterior abdominal wall. With the room darkened, a good light reflex was noted in the left upper quadrant. The patient had a thicker upper abdominal wall with excess skin from her weight loss. Even still there is good indentation with finger pressure. Finger pressure was applied at the light reflex with adequate indentation on the stomach wall on endoscopy. A snare was passed into the stomach, opened fully, and positioned to encircle the point of demonstrated finger indentation.   The skin was prepped, and the area was anesthetized with lidocaine and a 1cm incision was made.  The introducer needle was passed through the incision into the stomach under direct visualization with the gastroscope. The catheter was encircled with the snare, and the guidewire was passed and captured by the snare. The endoscope, snare, and guidewire were the withdrawn and pulled back into the mouth. The gastrostomy tube was attached to the loop of the guidewire and the whole thing was pulled back into the stomach. The gastrostomy tube was noted  to be at the 3.5cm mark at the skin level and at the 4.5cm mark at the top of the bumper.  The gastroscope was reintroduced and adequate placement was confirmed and a picture was taken. The gastrostomy tube was cute to length and the bumper and clamping appendage were placed. The PEG was clamped and secured in  place and an abdominal binder was placed. The patient tolerated the procedure well and was taken to the PACU in good condition.    Dr. Arnoldo Morale was assisted with the incision and abdominal portion of the procedure.     Curlene Labrum, MD [facility] [address] [place], [place] [address] [phone] (office)

## 2018-09-13 DIAGNOSIS — E876 Hypokalemia: Secondary | ICD-10-CM | POA: Diagnosis not present

## 2018-09-13 DIAGNOSIS — R112 Nausea with vomiting, unspecified: Secondary | ICD-10-CM | POA: Diagnosis not present

## 2018-09-13 DIAGNOSIS — E43 Unspecified severe protein-calorie malnutrition: Secondary | ICD-10-CM | POA: Diagnosis not present

## 2018-09-13 DIAGNOSIS — C7800 Secondary malignant neoplasm of unspecified lung: Secondary | ICD-10-CM | POA: Diagnosis not present

## 2018-09-13 DIAGNOSIS — Z431 Encounter for attention to gastrostomy: Secondary | ICD-10-CM | POA: Diagnosis not present

## 2018-09-13 DIAGNOSIS — R1310 Dysphagia, unspecified: Secondary | ICD-10-CM | POA: Diagnosis not present

## 2018-09-13 DIAGNOSIS — C3492 Malignant neoplasm of unspecified part of left bronchus or lung: Secondary | ICD-10-CM | POA: Diagnosis not present

## 2018-09-13 DIAGNOSIS — I2699 Other pulmonary embolism without acute cor pulmonale: Secondary | ICD-10-CM | POA: Diagnosis not present

## 2018-09-13 DIAGNOSIS — E119 Type 2 diabetes mellitus without complications: Secondary | ICD-10-CM | POA: Diagnosis not present

## 2018-09-13 DIAGNOSIS — Z7901 Long term (current) use of anticoagulants: Secondary | ICD-10-CM | POA: Diagnosis not present

## 2018-09-13 DIAGNOSIS — C3490 Malignant neoplasm of unspecified part of unspecified bronchus or lung: Secondary | ICD-10-CM | POA: Diagnosis not present

## 2018-09-13 MED ORDER — OSMOLITE 1.5 CAL PO LIQD: 240.0000 mL | Freq: Every day | ORAL | 2 refills | Status: AC

## 2018-09-13 MED ORDER — PRO-STAT SUGAR FREE PO LIQD: 30.0000 mL | Freq: Two times a day (BID) | ORAL | 1 refills | Status: DC

## 2018-09-13 MED ORDER — FREE WATER: 200.0000 mL | Freq: Four times a day (QID) | Status: DC

## 2018-09-13 MED ORDER — ACETAMINOPHEN 325 MG PO TABS: 650.0000 mg | ORAL_TABLET | Freq: Four times a day (QID) | ORAL | 1 refills | Status: DC | PRN

## 2018-09-13 NOTE — TOC Transition Note (Signed)
Transition of Care [facility]) - CM/SW Discharge Note   Patient Details  Name: Kaitlin [name] MRN: [id number] Date of Birth: 09/19/1950  Transition of Care [facility]) CM/SW Contact:  Latanya Maudlin, RN Phone Number: 09/13/2018, 2:13 PM   Clinical Narrative:  Patient to be discharged per MD order. Orders in place for home health services. Previous RNCM had began setup with [facility]. Notified Corene Cornea of discharge. [facility] to deliver [facility]. Family to transport.      Final next level of care: [place] Barriers to Discharge: No Barriers Identified   Patient Goals and CMS Choice Patient states their goals for this hospitalization and ongoing recovery are:: To return home with family.  CMS Medicare.gov Compare Post Acute Care list provided to:: Patient Choice offered to / list presented to : Patient  Discharge Placement                       Discharge Plan and Services In-house Referral: Nutrition Discharge Planning Services: CM Consult Post Acute Care Choice: Durable Medical Equipment, Home Health          DME Arranged: Bedside commode DME Agency: [facility] HH Arranged: RN [place] Agency: [facility])   Social Determinants of Health (SDOH) Interventions     Readmission Risk Interventions No flowsheet data found.

## 2018-09-13 NOTE — Progress Notes (Signed)
Patient discharged home today per MD orders. Patient vital signs WDL. IV removed and site WDL. Discharge Instructions including follow up appointments, medications, and education reviewed with patient. Patient verbalizes understanding. Patient is transported out via wheelchair.  

## 2018-09-13 NOTE — Discharge Summary (Signed)
Physician Discharge Summary  Kaitlin [name] NWG:[id number] DOB: 1950-06-05 DOA: 09/11/2018  PCP: [name], MD  Admit date: 09/11/2018 Discharge date: 09/13/2018  Time spent: 30 minutes  Recommendations for Outpatient Follow-up:  1. Reassess blood pressure and adjust antihypertensive regimen as needed 2. Repeat basic metabolic panel to follow electrolytes and renal function.   Discharge Diagnoses:  Active Problems:   Hypertension   Pulmonary embolism during treatment with long-term anticoagulation therapy (HCC)   Adenocarcinoma, lung, left (HCC)   Brain metastases (HCC)   Hypokalemia   Nausea and vomiting   Weight loss   Metastatic lung cancer (metastasis from lung to other site), left (HCC)   Protein-calorie malnutrition, severe   Discharge Condition: Stable and with instructions to be discharged home with follow-up with PCP, oncology service and general surgery as an outpatient.  Diet recommendation: Tube feedings as indicated by nutritional service.  Filed Weights   09/11/18 0333 09/11/18 1050  Weight: 76.7 kg 72.5 kg    History of present illness:  As per H&P written by Dr. Kerry [name] on 09/11/2018 68 y.o. female with medical history significant of lung cancer with brain mets, hypertension, venous thromboembolism on anticoagulation, presents to the hospital with nausea and vomiting.  Patient reports having episodes of nausea and vomiting, decreased p.o. intake intermittently since ending radiation approximately 6 weeks ago.  Her current episode began around 10 PM yesterday evening.  She is not had any fever, cough, shortness of breath.  She does feel generally weak.  She also reports having diarrhea.  She has been losing weight.  She is evaluated in the emergency room and found to have significant hypokalemia.  She is due to have a PEG tube placed tomorrow.  General surgery requested the patient be admitted for medical management in order to be optimized for procedure  tomorrow.  Hospital Course:  1-hypokalemia: Related to persistent nausea/vomiting and decreased oral intake.  Patient was also on chlorthalidone prior to admission. -Diuretic has been discontinued at time of discharge -Patient is a status post PEG tube with initiation of adequate nutrition and hydration -Electrolytes were repleted and essentially within normal limits at time of discharge. -Magnesium was normal. -Repeat basic metabolic panel follow-up visit to reassess electrolytes trend.  2-nausea vomiting: -Appears to be associated with ongoing chemotherapeutic therapy -Status post PEG placement -Continue the use of antiemetics and Reglan.  3-essential hypertension -Continue amlodipine -Blood pressure is stable and well-controlled. -Reassess vital signs and adjust antihypertensive regimen as needed.  4-adenocarcinoma of the lung with metastasis -Continue outpatient follow-up by oncology service. -Patient acutely receiving chemotherapy.  5-history of venous thromboembolism on chronic Lovenox therapy -Continue Lovenox at discharge.  6-hx of gout -Continue colchicine.  7-GERD -Continue PPI  8-history of anxiety -Stable mood -Continue as needed lorazepam.  9-severe protein calorie malnutrition -Discharge on tube feedings and prostate  Procedures:  See below for x-ray reports  Status post PEG tube placement 09/12/2018  Consultations:  General surgery  Discharge Exam: Vitals:   09/12/18 2123 09/13/18 0518  BP: 98/70 103/74  Pulse: 94 82  Resp: 16 18  Temp: 98.5 F (36.9 C) 99.1 F (37.3 C)  SpO2: 99% 98%    General: Afebrile, currently without active vomiting.  Still reporting intermittent nausea.  Mild discomfort around insertion of PEG tube. Cardiovascular: S1 and S2.  No rubs, no gallops, no JVD. Respiratory: Good air movement bilaterally.  Normal respiratory effort; good oxygen saturation on room air. Abdomen: No guarding, positive bowel sounds, PEG tube  in  place, no distention.  Discharge Instructions   Discharge Instructions    Discharge instructions   Complete by:  As directed    Take medications as prescribed Maintain adequate hydration Follow-up with PCP in 10 days Follow-up with oncology service as an outpatient.     Allergies as of 09/13/2018      Reactions   Banana Anaphylaxis   Aspirin Other (See Comments)   G.I. Upset       Medication List    STOP taking these medications   chlorthalidone 25 MG tablet Commonly known as:  HYGROTON   scopolamine 1 MG/3DAYS Commonly known as:  TRANSDERM-SCOP     TAKE these medications   acetaminophen 325 MG tablet Commonly known as:  TYLENOL Take 2 tablets (650 mg total) by mouth every 6 (six) hours as needed for mild pain (or Fever >/= 101).   albuterol (2.5 MG/3ML) 0.083% nebulizer solution Commonly known as:  PROVENTIL Take 3 mLs (2.5 mg total) by nebulization every 6 (six) hours as needed for wheezing or shortness of breath.   albuterol 108 (90 Base) MCG/ACT inhaler Commonly known as:  VENTOLIN HFA Inhale 2 puffs into the lungs every 6 (six) hours as needed for wheezing.   amLODipine 5 MG tablet Commonly known as:  NORVASC Take 5 mg by mouth daily.   colchicine 0.6 MG tablet Take 1 tablet (0.6 mg total) by mouth daily.   enoxaparin 150 MG/ML injection Commonly known as:  LOVENOX Inject 150 mg into the skin daily.   feeding supplement (OSMOLITE 1.5 CAL) Liqd Place 240 mLs into feeding tube 5 (five) times daily.   feeding supplement (PRO-STAT SUGAR FREE 64) Liqd Place 30 mLs into feeding tube 2 (two) times daily.   free water Soln Place 200 mLs into feeding tube every 6 (six) hours.   LORazepam 0.5 MG tablet Commonly known as:  ATIVAN Take 0.5 mg by mouth at bedtime as needed for anxiety.   metoCLOPramide 10 MG tablet Commonly known as:  REGLAN Take 1 tablet (10 mg total) by mouth every 8 (eight) hours as needed for refractory nausea / vomiting.    ondansetron 4 MG disintegrating tablet Commonly known as:  Zofran ODT Take 1 tablet (4 mg total) by mouth every 8 (eight) hours as needed for nausea or vomiting.   osimertinib mesylate 80 MG tablet Commonly known as:  TAGRISSO Take 1 tablet (80 mg total) by mouth daily.   pantoprazole 40 MG tablet Commonly known as:  PROTONIX Take 40 mg by mouth daily.   potassium chloride 10 MEQ tablet Commonly known as:  K-DUR Take 2 tablets (20 mEq total) by mouth 2 (two) times daily.   prochlorperazine 10 MG tablet Commonly known as:  COMPAZINE Take 1 tablet (10 mg total) by mouth every 6 (six) hours as needed for nausea or vomiting. What changed:  when to take this   True Metrix Meter Devi 1 Device by Does not apply route daily.   TRUEplus Lancets 30G Misc            Durable Medical Equipment  (From admission, onward)         Start     Ordered   09/11/18 1640  For home use only DME 3 n 1  Once     09/11/18 1640         Allergies  Allergen Reactions  . Banana Anaphylaxis  . Aspirin Other (See Comments)    G.I. Upset    Follow-up Information  Schedule an appointment as soon as possible for a visit  with [name], MD.   Specialty:  Internal Medicine Contact information: [address] [place] [address] [phone]        [facility] EMERGENCY DEPARTMENT.   Specialty:  Emergency Medicine Why:  If symptoms worsen Contact information: [address] [id number] [place] [place] [address] [phone]       [name], MD. Call in 7 day(s).   Specialty:  Internal Medicine Why:  Dr. Ouida Sills, Thursday, 09/18/2018 at 11:30 a.m. Contact information: [address] [place] [address] [phone]        Lucretia Roers, MD Follow up.   Specialty:  General Surgery Why:  As Needed.   Follow up with the Cancer Center and Home Health RN.  Home Health Should be your first contact for immediate problems with the feeding  tube. Follow up with Dr. Henreitta Leber if having issues or concerns, or after 8 weeks to remove the tube.  Contact information: [address] [place] [facility] [address] [phone]           The results of significant diagnostics from this hospitalization (including imaging, microbiology, ancillary and laboratory) are listed below for reference.    Significant Diagnostic Studies: Dg Chest Port 1 View  Result Date: 09/11/2018 CLINICAL DATA:  Chest pain. EXAM: PORTABLE CHEST 1 VIEW COMPARISON:  Radiographs of July 06, 2018. CT scan of July 10, 2018. FINDINGS: The heart size and mediastinal contours are within normal limits. No pneumothorax is noted. Left lung is clear. Right basilar opacity is improved compared to prior exam with minimal right pleural effusion present. The visualized skeletal structures are unremarkable. IMPRESSION: Significantly decreased right basilar opacity is noted consistent with improving pneumonia or atelectasis. Minimal right pleural effusion may be present. Electronically Signed   By: Lupita Raider M.D.   On: 09/11/2018 09:55    Microbiology: Recent Results (from the past 240 hour(s))  Surgical pcr screen     Status: None   Collection Time: 09/11/18  5:46 PM  Result Value Ref Range Status   MRSA, PCR NEGATIVE NEGATIVE Final   Staphylococcus aureus NEGATIVE NEGATIVE Final    Comment: (NOTE) The Xpert SA Assay (FDA approved for NASAL specimens in patients 69 years of age and older), is one component of a comprehensive surveillance program. It is not intended to diagnose infection nor to guide or monitor treatment. Performed at [facility], [address], [place], [place] [address]      Labs: Basic Metabolic Panel: Recent Labs  Lab 09/09/18 1013 09/11/18 0405 09/11/18 0844 09/11/18 1655 09/12/18 0507  NA 135 136 138 137 138  K 2.5* 2.5* 2.9* 2.8* 3.2*  CL 97* 98 103 105 110  CO2 26 25 25 24 23   GLUCOSE 101* 91 89 100* 75  BUN 15 12 10 9  7*   CREATININE 1.03* 1.00 0.84 0.72 0.67  CALCIUM 9.2 9.7 9.1 8.7* 8.5*  MG 1.6* 2.0  --   --   --    Liver Function Tests: Recent Labs  Lab 09/09/18 1013 09/11/18 0405  AST 15 15  ALT 11 13  ALKPHOS 66 67  BILITOT 0.5 0.5  PROT 7.8 8.3*  ALBUMIN 3.3* 3.4*   Recent Labs  Lab 09/11/18 0405  LIPASE 37   CBC: Recent Labs  Lab 09/09/18 1013 09/11/18 0340 09/12/18 0507  WBC 7.8 6.0 4.4  NEUTROABS 5.6 3.9  --   HGB 13.0 13.9 11.3*  HCT 40.4 42.9 35.5*  MCV 80.0 80.2 81.1  PLT 294 346 246   BNP (last 3 results) Recent Labs    07/06/18 1239  BNP 39.7    CBG: Recent Labs  Lab 09/12/18 1055  GLUCAP 76    Signed:  Vassie Loll MD.  [facility] 09/13/2018, 1:30 PM

## 2018-09-14 DIAGNOSIS — C3490 Malignant neoplasm of unspecified part of unspecified bronchus or lung: Secondary | ICD-10-CM | POA: Diagnosis not present

## 2018-09-15 ENCOUNTER — Encounter (HOSPITAL_COMMUNITY): Payer: Self-pay | Admitting: General Surgery

## 2018-09-16 ENCOUNTER — Ambulatory Visit (HOSPITAL_COMMUNITY): Payer: Medicare HMO | Admitting: Hematology

## 2018-09-16 ENCOUNTER — Other Ambulatory Visit (HOSPITAL_COMMUNITY): Payer: Medicare HMO

## 2018-09-16 ENCOUNTER — Encounter (HOSPITAL_COMMUNITY): Payer: Self-pay | Admitting: Dietician

## 2018-09-16 ENCOUNTER — Other Ambulatory Visit (HOSPITAL_COMMUNITY): Payer: Self-pay | Admitting: Dietician

## 2018-09-16 DIAGNOSIS — E119 Type 2 diabetes mellitus without complications: Secondary | ICD-10-CM | POA: Diagnosis not present

## 2018-09-16 DIAGNOSIS — C3490 Malignant neoplasm of unspecified part of unspecified bronchus or lung: Secondary | ICD-10-CM | POA: Diagnosis not present

## 2018-09-16 DIAGNOSIS — Z431 Encounter for attention to gastrostomy: Secondary | ICD-10-CM | POA: Diagnosis not present

## 2018-09-16 DIAGNOSIS — C7800 Secondary malignant neoplasm of unspecified lung: Secondary | ICD-10-CM | POA: Diagnosis not present

## 2018-09-16 DIAGNOSIS — E43 Unspecified severe protein-calorie malnutrition: Secondary | ICD-10-CM | POA: Diagnosis not present

## 2018-09-16 DIAGNOSIS — R1310 Dysphagia, unspecified: Secondary | ICD-10-CM | POA: Diagnosis not present

## 2018-09-16 MED ORDER — PROMOD PO LIQD: ORAL | Status: DC

## 2018-09-16 NOTE — Progress Notes (Signed)
Nutrition Assessment  ASSESSMENT:  68 y/o female w/ PMHx HTN, DM2, Stage  IV adenocarcinoma of lung. Dx in Dec. with 12/18 PET findings consistent w/ metastatic disease. First seen at [facility] on 12/23. MRI brain 1/3 showed ~20 brain mets. Lung Biopsy 1/22 + for adenocarcinoma.  Begun oral chemo 3/2. S/P WBRT 3/3. D/t intractable nausea and resultant FTT, pt is s/p hospitalization 4/16-4/18 w/ PEG placement and initiation of TF.   Following up with pt to see how she is tolerating her TF regimen. In hospital, she was begun on 1/2 can Osmolite 5x daily, with ultimate goal being 1 can 5x a day. She was also ordered prostat 30 ml BID, which would be changed to Promod in outpatient setting.   RD first called and spoke with sister (first # listed is apparently sisters). She says the pt has done extremely well with the tube feeding. In fact, the pt is both tolerating the tf AND eating by mouth. Sister says the pt will eat while they are infusing TF. Sister gave RD number to pts house.   RD called pt. Pt confirms she is doing well with the tube feeding, but she clarifies she is still only infusing 1/2 can boluses. She said she tried to do an entire can, but it made her stomach a little upset. She says her daughter is the one who is administering the feeds and, based on the pts report, the daughter is flushing the tube before/after use and also infusing the prostat modular as had been instructed. The pt is NOT administering the free water boluses at this time (200cc 4x day). She says she is drinking orally. RD asked if she thought her oral fluid intake equated to 20 oz. She says "sometimes yes". She says she rotates between pedialyte, gatorade, OJ and sweet tea.   Interestingly, she says that since her PEG was placed, her severe nausea has abated, which has allowed her to consume oral intake. Maybe d/t addition of Reglan? She still is taking the prescribed antiemetics.   Her current listed weight of 159.8 lbs was  taken during her recent hospitalization. This wt reflected a loss of 9 lbs x2.5 wks and 39 lbs in total since her diagnosis at the end of last November. This equates to a loss of 19.6% bw in <6 months.   Wt Readings from Last 10 Encounters:  09/11/18 159 lb 13.3 oz (72.5 kg)  08/26/18 168 lb 12.8 oz (76.6 kg)  08/15/18 168 lb (76.2 kg)  08/06/18 169 lb 9.6 oz (76.9 kg)  08/05/18 174 lb (78.9 kg)  08/05/18 170 lb 4.8 oz (77.2 kg)  07/15/18 185 lb 3 oz (84 kg)  07/06/18 189 lb 13.1 oz (86.1 kg)  07/03/18 188 lb 3.2 oz (85.4 kg)  06/26/18 186 lb 1 oz (84.4 kg)   MEDICATIONS: Chemo: Tagrisso  Supportive medications: KCL, Compazine, ppi, ativan, scopolamine, zofran, reglan  LABS: From hospital K:3.2, BUN/creat: 7/.67  Recent Labs  Lab 09/11/18 0405 09/11/18 0844 09/11/18 1655 09/12/18 0507  NA 136 138 137 138  K 2.5* 2.9* 2.8* 3.2*  CL 98 103 105 110  CO2 25 25 24 23   BUN 12 10 9  7*  CREATININE 1.00 0.84 0.72 0.67  CALCIUM 9.7 9.1 8.7* 8.5*  MG 2.0  --   --   --   GLUCOSE 91 89 100* 75   ANTHROPOMETRICS: Height:  Ht Readings from Last 1 Encounters:  09/11/18 5\' 6"  (1.676 m)   Weight:  Wt  Readings from Last 1 Encounters:  09/11/18 159 lb 13.3 oz (72.5 kg)   BMI:  BMI Readings from Last 1 Encounters:  09/11/18 25.80 kg/m   UBW: 213 lbs (per pt report) Wt changes:  -9 lbs (8.9% bw) x3 weeks -39 lbs (19.6% bw) since dx at end of Nov   ESTIMATED ENERGY NEEDS:  Kcal:  1900-2100 kcals (26-29 kcal/kg bw) Protein:  87-102g Pro (1.2-1.4g/kg bw) Fluid:  1.9-2.1 L fluid ( 45ml/kcal)  NUTRITION DIAGNOSIS:  Inadequate oral intake r/t early satiety, nausea and fatigue AEB loss of nearly 20% bw in <6 months  DOCUMENTATION CODES:  Not applicable  INTERVENTION:  Pt seems to have tolerated initiation of tube feeding surprisingly well. There have been no s/s of intolerance with the 4 oz boluses. She may only be doing 4 oz boluses, but she apparently is now eating orally,  something she has struggled with for many months. She says ever since the tube was placed her nausea has not been an issue. Question if this was d/t reglan? which was added the day before her PEG placement.  Her current regimen is providing the patient: 1088 kcals, 57g Pro and 453 mls fluid (+300 from flushes).   Given how long she had minimal intake, It is unsurprising she is having some mild nausea/discomfort with the 8 oz feeds. Would expect this to improve with continued TF administration. Depending on her diet recall, can discuss increasing volume when we next speak.   RD asked about their administration practices. She says they are refrigerating the half cans and then letting them come to room temperature as had been instructed. They also sound to be compliant with the maintenance flushes and promod.   She does not sound to be getting any of the 200 cc fluid boluses. RD is slightly worried her fluid intake is insufficient. RD reviewed how the TF regimen alone does NOT provide sufficient fluid and she will need to consume fluids by mouth to meet her needs. We reviewed s/s of dehydration. She did not report having any of them. RD asked her to consider adding maybe only 1 or 2 of the free water boluses.   During our discussion, it was discovered pt had an appt today. When pt was asked about this, she said she had been unaware. RD noted he would see if scheduler could give her a call to work out a reschedule.     Will follow up x1 week. Pt has RDs number if issues arise.   GOAL:  Pt will be able to tolerate 5 cans of TF/day OR will demonstrate sufficient oral intake to meet >90% of needs when combined with tube feeding -Progressing  MONITOR:  Chemotherapy tolerance, labs, weights, nausea, oral intake, diet tolerance, TF tolerance    Next Visit: 4/28  Remote   Burtis Junes RD, LDN, CNSC Clinical Nutrition Available Tues-Sat via Pager: [phone] 09/16/2018 12:22 PM

## 2018-09-16 NOTE — Progress Notes (Signed)
D/C prostat. Start promod

## 2018-09-17 ENCOUNTER — Other Ambulatory Visit: Payer: Self-pay

## 2018-09-17 ENCOUNTER — Encounter (HOSPITAL_COMMUNITY): Payer: Self-pay | Admitting: Hematology

## 2018-09-17 ENCOUNTER — Encounter: Payer: Self-pay | Admitting: Internal Medicine

## 2018-09-17 ENCOUNTER — Inpatient Hospital Stay (HOSPITAL_COMMUNITY): Payer: Medicare HMO

## 2018-09-17 ENCOUNTER — Inpatient Hospital Stay (HOSPITAL_BASED_OUTPATIENT_CLINIC_OR_DEPARTMENT_OTHER): Payer: Medicare HMO | Admitting: Hematology

## 2018-09-17 DIAGNOSIS — Z931 Gastrostomy status: Secondary | ICD-10-CM

## 2018-09-17 DIAGNOSIS — C3492 Malignant neoplasm of unspecified part of left bronchus or lung: Secondary | ICD-10-CM | POA: Diagnosis not present

## 2018-09-17 DIAGNOSIS — Z79899 Other long term (current) drug therapy: Secondary | ICD-10-CM

## 2018-09-17 DIAGNOSIS — C7931 Secondary malignant neoplasm of brain: Secondary | ICD-10-CM

## 2018-09-17 DIAGNOSIS — Z86718 Personal history of other venous thrombosis and embolism: Secondary | ICD-10-CM | POA: Diagnosis not present

## 2018-09-17 DIAGNOSIS — E46 Unspecified protein-calorie malnutrition: Secondary | ICD-10-CM | POA: Diagnosis not present

## 2018-09-17 DIAGNOSIS — Z7901 Long term (current) use of anticoagulants: Secondary | ICD-10-CM | POA: Diagnosis not present

## 2018-09-17 DIAGNOSIS — Z86711 Personal history of pulmonary embolism: Secondary | ICD-10-CM | POA: Diagnosis not present

## 2018-09-17 DIAGNOSIS — N289 Disorder of kidney and ureter, unspecified: Secondary | ICD-10-CM | POA: Diagnosis not present

## 2018-09-17 DIAGNOSIS — E119 Type 2 diabetes mellitus without complications: Secondary | ICD-10-CM | POA: Diagnosis not present

## 2018-09-17 DIAGNOSIS — I2699 Other pulmonary embolism without acute cor pulmonale: Secondary | ICD-10-CM | POA: Diagnosis not present

## 2018-09-17 DIAGNOSIS — I10 Essential (primary) hypertension: Secondary | ICD-10-CM | POA: Diagnosis not present

## 2018-09-17 LAB — CBC WITH DIFFERENTIAL/PLATELET
Abs Immature Granulocytes: 0.02 10*3/uL (ref 0.00–0.07)
Basophils Absolute: 0 10*3/uL (ref 0.0–0.1)
Basophils Relative: 1 %
Eosinophils Absolute: 0.2 10*3/uL (ref 0.0–0.5)
Eosinophils Relative: 6 %
HCT: 38 % (ref 36.0–46.0)
Hemoglobin: 12.2 g/dL (ref 12.0–15.0)
Immature Granulocytes: 1 %
Lymphocytes Relative: 19 %
Lymphs Abs: 0.7 10*3/uL (ref 0.7–4.0)
MCH: 26.1 pg (ref 26.0–34.0)
MCHC: 32.1 g/dL (ref 30.0–36.0)
MCV: 81.4 fL (ref 80.0–100.0)
Monocytes Absolute: 0.6 10*3/uL (ref 0.1–1.0)
Monocytes Relative: 16 %
Neutro Abs: 2.2 10*3/uL (ref 1.7–7.7)
Neutrophils Relative %: 57 %
Platelets: 278 10*3/uL (ref 150–400)
RBC: 4.67 MIL/uL (ref 3.87–5.11)
RDW: 16.7 % — ABNORMAL HIGH (ref 11.5–15.5)
WBC: 3.8 10*3/uL — ABNORMAL LOW (ref 4.0–10.5)
nRBC: 0 % (ref 0.0–0.2)

## 2018-09-17 LAB — COMPREHENSIVE METABOLIC PANEL
ALT: 11 U/L (ref 0–44)
AST: 17 U/L (ref 15–41)
Albumin: 2.8 g/dL — ABNORMAL LOW (ref 3.5–5.0)
Alkaline Phosphatase: 56 U/L (ref 38–126)
Anion gap: 8 (ref 5–15)
BUN: 9 mg/dL (ref 8–23)
CO2: 28 mmol/L (ref 22–32)
Calcium: 9 mg/dL (ref 8.9–10.3)
Chloride: 101 mmol/L (ref 98–111)
Creatinine, Ser: 0.78 mg/dL (ref 0.44–1.00)
GFR calc Af Amer: >60 mL/min (ref >60)
GFR calc non Af Amer: >60 mL/min (ref >60)
Glucose, Bld: 101 mg/dL — ABNORMAL HIGH (ref 70–99)
Potassium: 3.1 mmol/L — ABNORMAL LOW (ref 3.5–5.1)
Sodium: 137 mmol/L (ref 135–145)
Total Bilirubin: 0.1 mg/dL — ABNORMAL LOW (ref 0.3–1.2)
Total Protein: 6.3 g/dL — ABNORMAL LOW (ref 6.5–8.1)

## 2018-09-17 LAB — URIC ACID: Uric Acid, Serum: 4.7 mg/dL (ref 2.5–7.1)

## 2018-09-17 LAB — MAGNESIUM: Magnesium: 1.7 mg/dL (ref 1.7–2.4)

## 2018-09-17 NOTE — Progress Notes (Signed)
[name], [place] [address]   CLINIC:  Medical Oncology/Hematology  PCP:  [name], MD [address] [place] [address] [phone]   REASON FOR VISIT:  Follow-up for adenocarcinoma of the lung  CURRENT THERAPY:Tegr[facility]so      INTERVAL HISTORY:  [name] 68 y.o. female returns for routine follow-up. She is here today alone. She states that she has been doing 4 4 ounce tube feedings a day since tube placement. She states that she continues to take her potassium twice a day as directed. Denies any nausea, vomiting, or diarrhea. Denies any new pains. Had not noticed any recent bleeding such as epistaxis, hematuria or hematochezia. Denies recent chest pain on exertion, shortness of breath on minimal exertion, pre-syncopal episodes, or palpitations. Denies any numbness or tingling in hands or feet. Denies any recent fevers, infections, or recent hospitalizations. Patient reports appetite at 50% and energy level at 50%.   REVIEW OF SYSTEMS:  Review of Systems  All other systems reviewed and are negative.    PAST MEDICAL/SURGICAL HISTORY:  Past Medical History:  Diagnosis Date   Asthma    Cancer ([place]) 04/21/2018   Lung Cancer    Carpal tunnel syndrome    Cataract    left eye   Diabetes mellitus without complication ([place])    DVT (deep venous thrombosis) ([place])    Hypertension    Pneumonia 04/21/2018   Pulmonary embolism ([place]) 03/2018   Past Surgical History:  Procedure Laterality Date   ABDOMINAL HYSTERECTOMY     biopsy of right breast     ESOPHAGOGASTRODUODENOSCOPY (EGD) WITH PROPOFOL N/A 09/12/2018   Procedure: ESOPHAGOGASTRODUODENOSCOPY (EGD) WITH PROPOFOL (scope in 1017, scope out 1032);  Surgeon: Virl Cagey, MD;  Location: [facility];  Service: General;  Laterality: N/A;   FOOT SURGERY     implant left eye     INTRAOCULAR LENS INSERTION     PEG PLACEMENT N/A 09/12/2018   Procedure:  PERCUTANEOUS ENDOSCOPIC GASTROSTOMY (PEG) PLACEMENT;  Surgeon: Virl Cagey, MD;  Location: [facility];  Service: General;  Laterality: N/A;     SOCIAL HISTORY:  Social History   Socioeconomic History   Marital status: Widowed    Spouse name: Not on file   Number of children: 4   Years of education: 12   Highest education level: High school graduate  Occupational History   Not on file  Social Needs   Financial resource strain: Very hard   Food insecurity:    Worry: Sometimes true    Inability: Sometimes true   Transportation needs:    Medical: No    Non-medical: No  Tobacco Use   Smoking status: Never Smoker   Smokeless tobacco: Never Used  Substance and Sexual Activity   Alcohol use: Not Currently    Comment: occ   Drug use: No   Sexual activity: Yes    Partners: Male    Birth control/protection: Surgical  Lifestyle   Physical activity:    Days per week: 0 days    Minutes per session: Not on file   Stress: Only a little  Relationships   Social connections:    Talks on phone: More than three times a week    Gets together: More than three times a week    Attends religious service: More than 4 times per year    Active member of club or organization: Yes    Attends meetings of clubs or organizations: More  than 4 times per year    Relationship status: Widowed   Intimate partner violence:    Fear of current or ex partner: No    Emotionally abused: No    Physically abused: No    Forced sexual activity: No  Other Topics Concern   Not on file  Social History Narrative   The patient is widowed she has 1 son and 3 daughters and 10 grandchildren   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    FAMILY HISTORY:  Family History  Problem Relation Age of Onset   Heart failure Mother    Stroke Mother    Heart attack Mother    Kidney failure Other     Leukemia Father     CURRENT MEDICATIONS:  Outpatient Encounter Medications as of 09/17/2018  Medication Sig   acetaminophen (TYLENOL) 325 MG tablet Take 2 tablets (650 mg total) by mouth every 6 (six) hours as needed for mild pain (or Fever >/= 101).   albuterol (PROVENTIL HFA;VENTOLIN HFA) 108 (90 BASE) MCG/ACT inhaler Inhale 2 puffs into the lungs every 6 (six) hours as needed for wheezing.   albuterol (PROVENTIL) (2.5 MG/3ML) 0.083% nebulizer solution Take 3 mLs (2.5 mg total) by nebulization every 6 (six) hours as needed for wheezing or shortness of breath.   amLODipine (NORVASC) 5 MG tablet Take 5 mg by mouth daily.   Blood Glucose Monitoring Suppl (TRUE METRIX METER) DEVI 1 Device by Does not apply route daily.   colchicine 0.6 MG tablet Take 1 tablet (0.6 mg total) by mouth daily.   enoxaparin (LOVENOX) 150 MG/ML injection Inject 150 mg into the skin daily.    LORazepam (ATIVAN) 0.5 MG tablet Take 0.5 mg by mouth at bedtime as needed for anxiety.   metoCLOPramide (REGLAN) 10 MG tablet Take 1 tablet (10 mg total) by mouth every 8 (eight) hours as needed for refractory nausea / vomiting.   Nutritional Supplements (FEEDING SUPPLEMENT, OSMOLITE 1.5 CAL,) LIQD Place 240 mLs into feeding tube 5 (five) times daily.   Nutritional Supplements (PROMOD) LIQD 54ms via PEG BID.   ondansetron (ZOFRAN ODT) 4 MG disintegrating tablet Take 1 tablet (4 mg total) by mouth every 8 (eight) hours as needed for nausea or vomiting.   osimertinib mesylate (TAGRISSO) 80 MG tablet Take 1 tablet (80 mg total) by mouth daily.   pantoprazole (PROTONIX) 40 MG tablet Take 40 mg by mouth daily.    potassium chloride (K-DUR) 10 MEQ tablet Take 2 tablets (20 mEq total) by mouth 2 (two) times daily.   prochlorperazine (COMPAZINE) 10 MG tablet Take 1 tablet (10 mg total) by mouth every 6 (six) hours as needed for nausea or vomiting. (Patient taking differently: Take 10 mg by mouth 2 (two) times daily. )    TRUEplus Lancets 30G MISC    Water For Irrigation, Sterile (FREE WATER) SOLN Place 200 mLs into feeding tube every 6 (six) hours.   No facility-administered encounter medications on file as of 09/17/2018.     ALLERGIES:  Allergies  Allergen Reactions   Banana Anaphylaxis   Aspirin Other (See Comments)    G.I. Upset      PHYSICAL EXAM:  ECOG Performance status: 2  Vitals:   09/17/18 1149  BP: 115/75  Pulse: 82  Resp: 18  Temp: 98.6 F (37 C)  SpO2: 100%   Filed Weights   09/17/18 1149  Weight: 163 lb (73.9 kg)    Physical Exam Vitals signs reviewed.  Constitutional:      Appearance: Normal appearance.  Cardiovascular:     Rate and Rhythm: Normal rate and regular rhythm.     Heart sounds: Normal heart sounds.  Pulmonary:     Effort: Pulmonary effort is normal.  Abdominal:     General: There is no distension.     Palpations: Abdomen is soft. There is no mass.  Musculoskeletal:        General: No swelling.  Skin:    General: Skin is warm.  Neurological:     General: No focal deficit present.     Mental Status: She is alert and oriented to person, place, and time.  Psychiatric:        Mood and Affect: Mood normal.        Behavior: Behavior normal.      LABORATORY DATA:  I have reviewed the labs as listed.  CBC    Component Value Date/Time   WBC 3.8 (L) 09/17/2018 1040   RBC 4.67 09/17/2018 1040   HGB 12.2 09/17/2018 1040   HCT 38.0 09/17/2018 1040   PLT 278 09/17/2018 1040   MCV 81.4 09/17/2018 1040   MCH 26.1 09/17/2018 1040   MCHC 32.1 09/17/2018 1040   RDW 16.7 (H) 09/17/2018 1040   LYMPHSABS 0.7 09/17/2018 1040   MONOABS 0.6 09/17/2018 1040   EOSABS 0.2 09/17/2018 1040   BASOSABS 0.0 09/17/2018 1040   CMP Latest Ref Rng & Units 09/17/2018 09/12/2018 09/11/2018  Glucose 70 - 99 mg/dL 101(H) 75 100(H)  BUN 8 - 23 mg/dL 9 7(L) 9  Creatinine 0.44 - 1.00 mg/dL 0.78 0.67 0.72  Sodium 135 - 145 mmol/L 137 138 137  Potassium 3.5 - 5.1 mmol/L  3.1(L) 3.2(L) 2.8(L)  Chloride 98 - 111 mmol/L 101 110 105  CO2 22 - 32 mmol/L '28 23 24  '$ Calcium 8.9 - 10.3 mg/dL 9.0 8.5(L) 8.7(L)  Total Protein 6.5 - 8.1 g/dL 6.3(L) - -  Total Bilirubin 0.3 - 1.2 mg/dL 0.1(L) - -  Alkaline Phos 38 - 126 U/L 56 - -  AST 15 - 41 U/L 17 - -  ALT 0 - 44 U/L 11 - -       DIAGNOSTIC IMAGING:  I have independently reviewed the scans and discussed with the patient.   I have reviewed Venita Lick LPN's note and agree with the documentation.  I personally performed a face-to-face visit, made revisions and my assessment and plan is as follows.    ASSESSMENT & PLAN:   Metastatic lung cancer (metastasis from lung to other site), left ([place]) 1.  Metastatic adenocarcinoma of the left lung with multiple brain mets: -Guardant 360 showing EGFR exon 19 deletion (EGFR [id number]) - Presentation to ER on 04/24/2018 with shortness of breath.  She was never smoker. -CT PE protocol showed large pulmonary embolus in the descending interlobar pulmonary artery with occlusion of the right lower lobe pulmonary artery and additional small clots noted in the right middle lobe and right upper lobes with associated right pleural effusion and right lower lobe infarct.  It also showed incidental left lower lobe mass measuring 3.8 x 3.7 x 2.1 cm associated with hilar and mediastinal adenopathy. - PET/CT scan on 05/14/2018 shows hypermetabolic left lower lobe lung mass measuring 3.7 cm, SUV of 11.  Bilateral hilar adenopathy and subcarinal adenopathy.  Both adrenal glands appear thickened and irregular with increased SUV.  Right lower  lobe airspace disease has hypermetabolic rim and a hypermetabolic nodule medial to the main airspace disease.  This may represent evolving infection/inflammation. - MRI of the brain on 05/30/2018 shows 20 subcentimeter enhancing brain lesions consistent with metastasis, no edema. - Left lower lobe lung biopsy on 06/18/2018 consistent with adenocarcinoma.   There was not enough tissue to run molecular testing. - Biopsy of the right supraclavicular lymph node on 07/11/2018 was consistent with metastatic adenocarcinoma. -Osimertinib 80 mg daily started on 07/28/2018. -She was recently admitted to the hospital with nausea and vomiting from 09/11/2018 through 09/13/2018.  She had PEG tube placed on 09/12/2018. -She did not have any nausea or vomiting since placement of PEG tube. -She is not taking potassium on a regular basis.  She missed last night dose.  She took it this morning.  Potassium was 3.1.  I have reinforced to take potassium 40 mg twice daily. - She will continue osimertinib at the same dose.  I will follow her in 2 weeks.   2.  Recurrent pulmonary embolism: -This is likely secondary to active malignancy.  CT PE protocol on 04/24/2018 shows PE in the right lower lobe pulmonary artery.  She was started on Eliquis. -CT of the chest with contrast on 07/10/2018 shows bilateral pulmonary emboli, on the left lung new since diagnosis. -She was subsequently switched to Lovenox injection once daily.   3.  Weight loss: -She had significant weight loss.  Her dysphagia has improved since the start of osimertinib. -She had a PEG tube placed on 09/12/2018. -She is taking in 4 ounces of Osmolite 4 times a day.  She is seeing our dietitian Ovid Curd.  We will slowly increase the tube feeds.  She is also eating by mouth at this time.  Her weight today has improved to 163.  4.  Acute gout episode: - She had gout involving the left foot.  She was treated with colchicine.  This has resolved.      Total time spent is 25 minutes with more than 50% of the time spent face-to-face discussing lab results, dietary options, need for maintaining an improvement of weight and coordination of care.     Orders placed this encounter:  No orders of the defined types were placed in this encounter.     Derek Jack, MD [place] [phone]

## 2018-09-17 NOTE — Patient Instructions (Addendum)
[facility] at [facility] Discharge Instructions  You were seen today by Dr. Katragadda. He went over your recent lab results. He will see you back in 2 weeks for labs and follow up.   Thank you for choosing [facility] at [facility] to provide your oncology and hematology care.  To afford each patient quality time with our provider, please arrive at least 15 minutes before your scheduled appointment time.   If you have a lab appointment with the Cancer Center please come in thru the  Main Entrance and check in at the main information desk  You need to re-schedule your appointment should you arrive 10 or more minutes late.  We strive to give you quality time with our providers, and arriving late affects you and other patients whose appointments are after yours.  Also, if you no show three or more times for appointments you may be dismissed from the clinic at the providers discretion.     Again, thank you for choosing [facility].  Our hope is that these requests will decrease the amount of time that you wait before being seen by our physicians.       _____________________________________________________________  Should you have questions after your visit to [facility], please contact our office at [phone] between the hours of 8:00 a.m. and 4:30 p.m.  Voicemails left after 4:00 p.m. will not be returned until the following business day.  For prescription refill requests, have your pharmacy contact our office and allow 72 hours.    Cancer Center Support Programs:   > Cancer Support Group  2nd Tuesday of the month 1pm-2pm, Journey Room    

## 2018-09-17 NOTE — Assessment & Plan Note (Signed)
1.  Metastatic adenocarcinoma of the left lung with multiple brain mets: -Guardant 360 showing EGFR exon 19 deletion (EGFR [id number]) - Presentation to ER on 04/24/2018 with shortness of breath.  She was never smoker. -CT PE protocol showed large pulmonary embolus in the descending interlobar pulmonary artery with occlusion of the right lower lobe pulmonary artery and additional small clots noted in the right middle lobe and right upper lobes with associated right pleural effusion and right lower lobe infarct.  It also showed incidental left lower lobe mass measuring 3.8 x 3.7 x 2.1 cm associated with hilar and mediastinal adenopathy. - PET/CT scan on 05/14/2018 shows hypermetabolic left lower lobe lung mass measuring 3.7 cm, SUV of 11.  Bilateral hilar adenopathy and subcarinal adenopathy.  Both adrenal glands appear thickened and irregular with increased SUV.  Right lower lobe airspace disease has hypermetabolic rim and a hypermetabolic nodule medial to the main airspace disease.  This may represent evolving infection/inflammation. - MRI of the brain on 05/30/2018 shows 20 subcentimeter enhancing brain lesions consistent with metastasis, no edema. - Left lower lobe lung biopsy on 06/18/2018 consistent with adenocarcinoma.  There was not enough tissue to run molecular testing. - Biopsy of the right supraclavicular lymph node on 07/11/2018 was consistent with metastatic adenocarcinoma. -Osimertinib 80 mg daily started on 07/28/2018. -She was recently admitted to the hospital with nausea and vomiting from 09/11/2018 through 09/13/2018.  She had PEG tube placed on 09/12/2018. -She did not have any nausea or vomiting since placement of PEG tube. -She is not taking potassium on a regular basis.  She missed last night dose.  She took it this morning.  Potassium was 3.1.  I have reinforced to take potassium 40 mg twice daily. - She will continue osimertinib at the same dose.  I will follow her in 2 weeks.   2.   Recurrent pulmonary embolism: -This is likely secondary to active malignancy.  CT PE protocol on 04/24/2018 shows PE in the right lower lobe pulmonary artery.  She was started on Eliquis. -CT of the chest with contrast on 07/10/2018 shows bilateral pulmonary emboli, on the left lung new since diagnosis. -She was subsequently switched to Lovenox injection once daily.   3.  Weight loss: -She had significant weight loss.  Her dysphagia has improved since the start of osimertinib. -She had a PEG tube placed on 09/12/2018. -She is taking in 4 ounces of Osmolite 4 times a day.  She is seeing our dietitian Ovid Curd.  We will slowly increase the tube feeds.  She is also eating by mouth at this time.  Her weight today has improved to 163.  4.  Acute gout episode: - She had gout involving the left foot.  She was treated with colchicine.  This has resolved.

## 2018-09-18 ENCOUNTER — Encounter: Payer: Self-pay | Admitting: General Practice

## 2018-09-18 DIAGNOSIS — R971 Elevated cancer antigen 125 [CA 125]: Secondary | ICD-10-CM | POA: Diagnosis not present

## 2018-09-18 DIAGNOSIS — Z931 Gastrostomy status: Secondary | ICD-10-CM | POA: Diagnosis not present

## 2018-09-18 DIAGNOSIS — C7800 Secondary malignant neoplasm of unspecified lung: Secondary | ICD-10-CM | POA: Diagnosis not present

## 2018-09-18 DIAGNOSIS — Z431 Encounter for attention to gastrostomy: Secondary | ICD-10-CM | POA: Diagnosis not present

## 2018-09-18 DIAGNOSIS — R1310 Dysphagia, unspecified: Secondary | ICD-10-CM | POA: Diagnosis not present

## 2018-09-18 DIAGNOSIS — E43 Unspecified severe protein-calorie malnutrition: Secondary | ICD-10-CM | POA: Diagnosis not present

## 2018-09-18 DIAGNOSIS — E119 Type 2 diabetes mellitus without complications: Secondary | ICD-10-CM | POA: Diagnosis not present

## 2018-09-18 DIAGNOSIS — C3490 Malignant neoplasm of unspecified part of unspecified bronchus or lung: Secondary | ICD-10-CM | POA: Diagnosis not present

## 2018-09-18 DIAGNOSIS — I10 Essential (primary) hypertension: Secondary | ICD-10-CM | POA: Diagnosis not present

## 2018-09-18 DIAGNOSIS — K9420 Gastrostomy complication, unspecified: Secondary | ICD-10-CM | POA: Diagnosis not present

## 2018-09-18 NOTE — Progress Notes (Signed)
[place] Support Services Team contacted patient to assess for food insecurity and other psychosocial needs during current COVID19 pandemic.  Left VM for patient on home phone.  Spoke w sister, "shes doing fantastic, now has feeding tube and its wonderful and she can eat food and use tube."  Gaining weight back. Does not go out except for appointments.       Patient/family expressed no needs at this time.  Support Team member encouraged patient to call if changes occur or they have any other questions/concerns.   Beverely Pace, [place]

## 2018-09-19 ENCOUNTER — Other Ambulatory Visit (HOSPITAL_COMMUNITY): Payer: Medicare HMO

## 2018-09-19 ENCOUNTER — Ambulatory Visit (HOSPITAL_COMMUNITY): Payer: Medicare HMO | Admitting: Hematology

## 2018-09-22 DIAGNOSIS — R1310 Dysphagia, unspecified: Secondary | ICD-10-CM | POA: Diagnosis not present

## 2018-09-22 DIAGNOSIS — C7800 Secondary malignant neoplasm of unspecified lung: Secondary | ICD-10-CM | POA: Diagnosis not present

## 2018-09-22 DIAGNOSIS — E43 Unspecified severe protein-calorie malnutrition: Secondary | ICD-10-CM | POA: Diagnosis not present

## 2018-09-22 DIAGNOSIS — E119 Type 2 diabetes mellitus without complications: Secondary | ICD-10-CM | POA: Diagnosis not present

## 2018-09-22 DIAGNOSIS — C3490 Malignant neoplasm of unspecified part of unspecified bronchus or lung: Secondary | ICD-10-CM | POA: Diagnosis not present

## 2018-09-22 DIAGNOSIS — Z431 Encounter for attention to gastrostomy: Secondary | ICD-10-CM | POA: Diagnosis not present

## 2018-09-23 ENCOUNTER — Encounter (HOSPITAL_COMMUNITY): Payer: Self-pay | Admitting: Dietician

## 2018-09-23 NOTE — Progress Notes (Signed)
Nutrition Assessment  ASSESSMENT:  68 y/o female w/ PMHx HTN, DM2, Stage  IV adenocarcinoma of lung. Dx in Dec. with 12/18 PET findings consistent w/ metastatic disease. First seen at [facility] on 12/23. MRI brain 1/3 showed ~20 brain mets. Lung Biopsy 1/22 + for adenocarcinoma.  Begun oral chemo 3/2. S/P WBRT 3/3. D/t intractable nausea and resultant FTT, pt is s/p hospitalization 4/16-4/18 w/ PEG placement and initiation of TF.   Following up with pt to see how she is tolerating her TF. When spoke with a week ago, she was infusing 1/2 can boluses 5x a day along with Promod 72m BID.  Today, pt reports several changes to her TF regimen. She says the Promod has been making her sick. The past 3 days, every time her sister has infused the promod, she has thrown up. It does not matter that it is going through the tube, it still makes her sick. In regards to her TF boluses, she is not longer administering 5 boluses a day, rather only 3-4. She says she does this so she can try to eat later in the day. Also, she says the bolus sizes vary over the course of the day. She begins in the morning with half can boluses and increases to whole can boluses later in the day. She estimates she receives a total of 3 cans of TF over the course of the day.   Orally, she is eating fairly well. Yesterday, she says she ate cold cereal in the morning, orangles/apples with PB in the middle of the day and a takeout meal from "[employer]" for dinner, which consisted of shrimp and okra. Additionally, she is drinking 2-3 Boost supplements each day. She is still consuming gatorade, pedialyte, tea and juice. She denies any s/s of dehydration. She said the nurse came out to her house yesterday to check her vitals and they were normal.   Other than with the Promod, she has not had any nausea. She denies any constipation or diarrhea, though her stools are looser d/t the TF. Though hesitant, she does says she feels better/stronger since her PEG  was placed.   Fortunately, pt appears to have regained a small amount of wt. She was 159.8 at time of PEG placement a 4/17 and was at 163 lbs at her appt 4/22. However, the latter wt was taken outpatient and measurement likely included shoes, clothes etc. She is down 5 lbs x1 month and 35 lbs in total since she was diagnosed at the end of last November (=17.7% bw in <6 months).   Wt Readings from Last 10 Encounters:  09/17/18 163 lb (73.9 kg)  09/11/18 159 lb 13.3 oz (72.5 kg)  08/26/18 168 lb 12.8 oz (76.6 kg)  08/15/18 168 lb (76.2 kg)  08/06/18 169 lb 9.6 oz (76.9 kg)  08/05/18 174 lb (78.9 kg)  08/05/18 170 lb 4.8 oz (77.2 kg)  07/15/18 185 lb 3 oz (84 kg)  07/06/18 189 lb 13.1 oz (86.1 kg)  07/03/18 188 lb 3.2 oz (85.4 kg)   MEDICATIONS: Chemo: Tagrisso  Supportive medications: KCL, Compazine, ppi, ativan, scopolamine, zofran, reglan   LABS: From hospital K:3.1, Albumin:2.8. Renal labs stable  Recent Labs  Lab 09/17/18 1040  NA 137  K 3.1*  CL 101  CO2 28  BUN 9  CREATININE 0.78  CALCIUM 9.0  MG 1.7  GLUCOSE 101*   ANTHROPOMETRICS: Height:  Ht Readings from Last 1 Encounters:  09/11/18 '5\' 6"'$  (1.676 m)   Weight:  Wt Readings from Last 1 Encounters:  09/17/18 163 lb (73.9 kg)   BMI:  BMI Readings from Last 1 Encounters:  09/17/18 26.31 kg/m   UBW: 213 lbs (per pt report) Wt changes:  -5 lbs x1 month -35 lbs (17.7% bw) since dx at end of Nov (198 at that time)  ESTIMATED ENERGY NEEDS:  Kcal:  1900-2150 kcals (26-29 kcal/kg bw) Protein:  89-103g Pro (1.2-1.4g/kg bw) Fluid:  1.9-2.2 L fluid ( 71m/kcal)  NUTRITION DIAGNOSIS:  Inadequate oral intake r/t early satiety, nausea and fatigue AEB loss of >15% bw in <6 months  DOCUMENTATION CODES:  Not applicable  INTERVENTION:  Pt is having nausea/emesis with Promod Administration. RD stated she could stop administering this if it is making her sick, she sounds to be eating more than enough orally to make  up for it anyway.   Her supplemental TF regimen (3 cans Osmolite 1.5) is providing her: 1065 kcals, 45g Pro and 543 mls fluid (+~300 from flushes). She is receiving atleast another 500 kcals and 25g Pro from the Boost supplements she drinks. She is then eating several meals during day and all of them are containing protein sources. Based on this, she is estimated to be meeting >100% of kcal/pro needs. Her modest wt gain supports this.   She has not been infusing the free water boluses, but sounds to be drinking enough fluids by mouth. Per pt, the home health nurse assessed her yesterday and apparently felt her volume status was appropriate.   Pt has tolerated the initiation of tube feeding surprisingly well. There have been no s/s of intolerance with the 4-8 oz boluses. She also has been eating much better since her hospitalization. Feel this is d/t addition of Reglan, which was added the day before PEG placement.  Will follow up x1 week. Pt has RDs number if issues arise.   GOAL:  Pt will be able to tolerate 5 cans of TF/day OR will demonstrate sufficient oral intake to meet >90% of needs when combined with tube feeding -BELIEVED MET  MONITOR:  Chemotherapy tolerance, labs, weights, nausea, oral intake, diet tolerance, TF tolerance    Next Visit: 5/5 -Remote   NBurtis JunesRD, LDN, CNSC Clinical Nutrition Available Tues-Sat via Pager: [phone]/28/2020 9:19 AM

## 2018-09-29 DIAGNOSIS — C3490 Malignant neoplasm of unspecified part of unspecified bronchus or lung: Secondary | ICD-10-CM | POA: Diagnosis not present

## 2018-09-29 DIAGNOSIS — E43 Unspecified severe protein-calorie malnutrition: Secondary | ICD-10-CM | POA: Diagnosis not present

## 2018-09-29 DIAGNOSIS — E119 Type 2 diabetes mellitus without complications: Secondary | ICD-10-CM | POA: Diagnosis not present

## 2018-09-29 DIAGNOSIS — Z431 Encounter for attention to gastrostomy: Secondary | ICD-10-CM | POA: Diagnosis not present

## 2018-09-29 DIAGNOSIS — C7800 Secondary malignant neoplasm of unspecified lung: Secondary | ICD-10-CM | POA: Diagnosis not present

## 2018-09-29 DIAGNOSIS — R1310 Dysphagia, unspecified: Secondary | ICD-10-CM | POA: Diagnosis not present

## 2018-09-30 ENCOUNTER — Encounter (HOSPITAL_COMMUNITY): Payer: Self-pay | Admitting: Dietician

## 2018-09-30 NOTE — Progress Notes (Addendum)
Nutrition Assessment  ASSESSMENT:  68 y/o female w/ PMHx HTN, DM2, Stage  IV adenocarcinoma of lung. Dx in Dec. with 12/18 PET findings consistent w/ metastatic disease. First seen at [facility] on 12/23. MRI brain 1/3 showed ~20 brain mets. Lung Biopsy 1/22 + for adenocarcinoma.  Begun oral chemo 3/2. S/P WBRT 3/3. D/t intractable nausea and resultant FTT, pt is s/p hospitalization 4/16-4/18 w/ PEG placement and initiation of TF.   Checking in again with patient regarding her TF, which was initiated 2 and a half weeks ago.   Was able to reach pt by phone. Pt reports her TF intake to be about the same as last week. She is infusing a total of 3-4 cans a day. She likes to start the day with half can boluses and increases to whole can boluses later in the day. As discussed last week, pt is no longer administering the Promod. In addition to no longer having nausea, she also says her stools have firmed up since this was stopped.   Orally, she says she has not had as much of a "taste" for solid foods recently. She has been preferring to consume liquids. However, she still does eat some solid foods. She had hot cereal this morning and a ham sandwich last night. She still occasionally has the PB and fruit she described last week.   She denies any s/s of dehydration (dizziness, dark urine). She has not had any nausea. She denies any constipation or diarrhea, and her bowels are now regular.    There are no new wt measurements since last week. Pt reports she does weigh herself regularly at home and says she was 161 lbs yesterday morning. She was 159.8 at time of PEG placement a 4/17 and was 163 lbs at her appt 4/22. At time of her last office wt, she was down 5 lbs x1 month and 35 lbs in total since she was diagnosed at the end of last November (=17.7% bw in <6 months).   Wt Readings from Last 10 Encounters:  09/17/18 163 lb (73.9 kg)  09/11/18 159 lb 13.3 oz (72.5 kg)  08/26/18 168 lb 12.8 oz (76.6 kg)  08/15/18  168 lb (76.2 kg)  08/06/18 169 lb 9.6 oz (76.9 kg)  08/05/18 174 lb (78.9 kg)  08/05/18 170 lb 4.8 oz (77.2 kg)  07/15/18 185 lb 3 oz (84 kg)  07/06/18 189 lb 13.1 oz (86.1 kg)  07/03/18 188 lb 3.2 oz (85.4 kg)   MEDICATIONS: Chemo: Tagrisso  Supportive medications: KCL, Compazine, ppi, ativan, zofran, reglan   LABS: No new labs   ANTHROPOMETRICS: Height:  Ht Readings from Last 1 Encounters:  09/11/18 5' 6" (1.676 m)   Weight:  Wt Readings from Last 1 Encounters:  09/17/18 163 lb (73.9 kg)   BMI:  BMI Readings from Last 1 Encounters:  09/17/18 26.31 kg/m   UBW: 213 lbs (per pt report) Wt changes (based on wt 4/22):  -5 lbs x1 month -35 lbs (17.7% bw) since dx at end of Nov (198 at that time)  ESTIMATED ENERGY NEEDS:  Kcal:  1900-2150 kcals (26-29 kcal/kg bw) Protein:  89-103g Pro (1.2-1.4g/kg bw) Fluid:  1.9-2.2 L fluid ( 45m/kcal)  NUTRITION DIAGNOSIS:  Inadequate oral intake r/t early satiety, nausea and fatigue AEB loss of >15% bw in <6 months  DOCUMENTATION CODES:  Not applicable  INTERVENTION:  Pt seems to be doing well with the tube feeding. She and her family have incorporated the TF in a  way that still allows pt to eat during the day. She is tolerating her feeds and, since cessation of the promod, she has not had any nausea or loose stools. Pt says she has no complaints.   Her supplemental TF regimen (3-4 cans Osmolite 1.5) is providing her: 1065-1420 kcals, 45-60g Pro and 543-724 mls fluid (+~300 from flushes). She is receiving atleast another 600 kcals and 35g Pro from the 3-4 Boost supplements she drinks. She also receives additional nutrition from the various food items she consumes throughout the day.  Based on this, she is estimated to be meeting >100% of kcal/pro needs.   She has not been infusing the free water boluses, but sounds to be drinking enough fluids by mouth.   Given her high consumption of oral supplements, RD again offered Ensure  assistance. She accepted. Will leave a case of Ensure for pt to pick up tomorrow at her office visit.   Pt has tolerated the initiation of tube feeding surprisingly well. There have been no s/s of intolerance with the 4-8 oz boluses. She also has been eating much better since her hospitalization. Feel this is d/t addition of Reglan, which was added the day before PEG placement.  Will follow up x1 week. Pt has RDs number if issues arise.   GOAL:  Pt will be able to tolerate 5 cans of TF/day OR will demonstrate sufficient oral intake to meet >90% of needs when combined with tube feeding -BELIEVED MET  MONITOR:  Chemotherapy tolerance, labs, weights, nausea, oral intake, diet tolerance, TF tolerance    Next Visit: 5/12 - Remote   Burtis Junes RD, LDN, CNSC Clinical Nutrition Available Tues-Sat via Pager: [phone] 09/30/2018 9:12 AM

## 2018-10-01 ENCOUNTER — Inpatient Hospital Stay (HOSPITAL_COMMUNITY): Payer: Medicare HMO | Attending: Hematology | Admitting: Hematology

## 2018-10-01 ENCOUNTER — Encounter (HOSPITAL_COMMUNITY): Payer: Self-pay | Admitting: Hematology

## 2018-10-01 ENCOUNTER — Inpatient Hospital Stay (HOSPITAL_COMMUNITY): Payer: Medicare HMO

## 2018-10-01 ENCOUNTER — Other Ambulatory Visit: Payer: Self-pay

## 2018-10-01 DIAGNOSIS — Z7901 Long term (current) use of anticoagulants: Secondary | ICD-10-CM | POA: Insufficient documentation

## 2018-10-01 DIAGNOSIS — R634 Abnormal weight loss: Secondary | ICD-10-CM | POA: Diagnosis not present

## 2018-10-01 DIAGNOSIS — I2699 Other pulmonary embolism without acute cor pulmonale: Secondary | ICD-10-CM | POA: Insufficient documentation

## 2018-10-01 DIAGNOSIS — C7931 Secondary malignant neoplasm of brain: Secondary | ICD-10-CM | POA: Insufficient documentation

## 2018-10-01 DIAGNOSIS — C3492 Malignant neoplasm of unspecified part of left bronchus or lung: Secondary | ICD-10-CM | POA: Insufficient documentation

## 2018-10-01 DIAGNOSIS — Z931 Gastrostomy status: Secondary | ICD-10-CM | POA: Insufficient documentation

## 2018-10-01 DIAGNOSIS — Z79899 Other long term (current) drug therapy: Secondary | ICD-10-CM | POA: Diagnosis not present

## 2018-10-01 LAB — CBC WITH DIFFERENTIAL/PLATELET
Abs Immature Granulocytes: 0.03 10*3/uL (ref 0.00–0.07)
Basophils Absolute: 0 10*3/uL (ref 0.0–0.1)
Basophils Relative: 1 %
Eosinophils Absolute: 0.7 10*3/uL — ABNORMAL HIGH (ref 0.0–0.5)
Eosinophils Relative: 15 %
HCT: 39.8 % (ref 36.0–46.0)
Hemoglobin: 12.4 g/dL (ref 12.0–15.0)
Immature Granulocytes: 1 %
Lymphocytes Relative: 15 %
Lymphs Abs: 0.7 10*3/uL (ref 0.7–4.0)
MCH: 25.9 pg — ABNORMAL LOW (ref 26.0–34.0)
MCHC: 31.2 g/dL (ref 30.0–36.0)
MCV: 83.3 fL (ref 80.0–100.0)
Monocytes Absolute: 0.7 10*3/uL (ref 0.1–1.0)
Monocytes Relative: 14 %
Neutro Abs: 2.5 10*3/uL (ref 1.7–7.7)
Neutrophils Relative %: 54 %
Platelets: 267 10*3/uL (ref 150–400)
RBC: 4.78 MIL/uL (ref 3.87–5.11)
RDW: 17.7 % — ABNORMAL HIGH (ref 11.5–15.5)
WBC: 4.7 10*3/uL (ref 4.0–10.5)
nRBC: 0 % (ref 0.0–0.2)

## 2018-10-01 LAB — COMPREHENSIVE METABOLIC PANEL
ALT: 23 U/L (ref 0–44)
AST: 24 U/L (ref 15–41)
Albumin: 3.2 g/dL — ABNORMAL LOW (ref 3.5–5.0)
Alkaline Phosphatase: 73 U/L (ref 38–126)
Anion gap: 12 (ref 5–15)
BUN: 13 mg/dL (ref 8–23)
CO2: 27 mmol/L (ref 22–32)
Calcium: 9.3 mg/dL (ref 8.9–10.3)
Chloride: 94 mmol/L — ABNORMAL LOW (ref 98–111)
Creatinine, Ser: 0.85 mg/dL (ref 0.44–1.00)
GFR calc Af Amer: >60 mL/min (ref >60)
GFR calc non Af Amer: >60 mL/min (ref >60)
Glucose, Bld: 178 mg/dL — ABNORMAL HIGH (ref 70–99)
Potassium: 4.1 mmol/L (ref 3.5–5.1)
Sodium: 133 mmol/L — ABNORMAL LOW (ref 135–145)
Total Bilirubin: 0.2 mg/dL — ABNORMAL LOW (ref 0.3–1.2)
Total Protein: 7.2 g/dL (ref 6.5–8.1)

## 2018-10-01 NOTE — Progress Notes (Signed)
Kaitlin [name], Kaitlin [name] [id number]   CLINIC:  Medical Oncology/Hematology  PCP:  Kaitlin Noble, MD [address] [place]  [id number] [phone]   REASON FOR VISIT:  Follow-up for adenocarcinoma of the lung  CURRENT THERAPY:Tegrisso    INTERVAL HISTORY:  Kaitlin [name] 68 y.o. female returns for routine follow-up. She is here today alone. She states that she still feels a lump in her throat. She has had some recent weight loss as well. She states that she continues taking the [place] with minimal side effects. She states that she has dry skin and some itching. She states that she is drinking 3 cans of ensure a day and using 4 cans of osmolite in her feeding tube. Denies any nausea, vomiting, or diarrhea. Denies any new pains. Had not noticed any recent bleeding such as epistaxis, hematuria or hematochezia. Denies recent chest pain on exertion, shortness of breath on minimal exertion, pre-syncopal episodes, or palpitations. Denies any numbness or tingling in hands or feet. Denies any recent fevers, infections, or recent hospitalizations. Patient reports appetite at 0% and energy level at 25%.   REVIEW OF SYSTEMS:  Review of Systems  Skin: Positive for itching.  All other systems reviewed and are negative.    PAST MEDICAL/SURGICAL HISTORY:  Past Medical History:  Diagnosis Date  . Asthma   . Cancer ([place]) 04/21/2018   Lung Cancer   . Carpal tunnel syndrome   . Cataract    left eye  . Diabetes mellitus without complication ([place])   . DVT (deep venous thrombosis) ([place])   . Hypertension   . Pneumonia 04/21/2018  . Pulmonary embolism ([place]) 03/2018   Past Surgical History:  Procedure Laterality Date  . ABDOMINAL HYSTERECTOMY    . biopsy of right breast    . ESOPHAGOGASTRODUODENOSCOPY (EGD) WITH PROPOFOL N/A 09/12/2018   Procedure: ESOPHAGOGASTRODUODENOSCOPY (EGD) WITH PROPOFOL (scope in 1017, scope out 1032);  Surgeon: Virl Cagey, MD;  Location: [facility];  Service: General;  Laterality: N/A;  . FOOT SURGERY    . implant left eye    . INTRAOCULAR LENS INSERTION    . PEG PLACEMENT N/A 09/12/2018   Procedure: PERCUTANEOUS ENDOSCOPIC GASTROSTOMY (PEG) PLACEMENT;  Surgeon: Virl Cagey, MD;  Location: [facility];  Service: General;  Laterality: N/A;     SOCIAL HISTORY:  Social History   Socioeconomic History  . Marital status: Widowed    Spouse name: Not on file  . Number of children: 4  . Years of education: 47  . Highest education level: High school graduate  Occupational History  . Not on file  Social Needs  . Financial resource strain: Very hard  . Food insecurity:    Worry: Sometimes true    Inability: Sometimes true  . Transportation needs:    Medical: No    Non-medical: No  Tobacco Use  . Smoking status: Never Smoker  . Smokeless tobacco: Never Used  Substance and Sexual Activity  . Alcohol use: Not Currently    Comment: occ  . Drug use: No  . Sexual activity: Yes    Partners: Male    Birth control/protection: Surgical  Lifestyle  . Physical activity:    Days per week: 0 days    Minutes per session: Not on file  . Stress: Only a little  Relationships  . Social connections:    Talks on phone: More than three times a week    Gets together: More than  three times a week    Attends religious service: More than 4 times per year    Active member of club or organization: Yes    Attends meetings of clubs or organizations: More than 4 times per year    Relationship status: Widowed  . Intimate partner violence:    Fear of current or ex partner: No    Emotionally abused: No    Physically abused: No    Forced sexual activity: No  Other Topics Concern  . 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    FAMILY HISTORY:  Family History  Problem Relation Age of Onset  . Heart failure Mother   . Stroke Mother   . Heart attack Mother   . Kidney failure Other   . Leukemia Father     CURRENT MEDICATIONS:  Outpatient Encounter Medications as of 10/01/2018  Medication Sig  . acetaminophen (TYLENOL) 325 MG tablet Take 2 tablets (650 mg total) by mouth every 6 (six) hours as needed for mild pain (or Fever >/= 101).  Marland Kitchen albuterol (PROVENTIL HFA;VENTOLIN HFA) 108 (90 BASE) MCG/ACT inhaler Inhale 2 puffs into the lungs every 6 (six) hours as needed for wheezing.  Marland Kitchen albuterol (PROVENTIL) (2.5 MG/3ML) 0.083% nebulizer solution Take 3 mLs (2.5 mg total) by nebulization every 6 (six) hours as needed for wheezing or shortness of breath.  Marland Kitchen amLODipine (NORVASC) 5 MG tablet Take 5 mg by mouth daily.  . Blood Glucose Monitoring Suppl (TRUE METRIX METER) DEVI 1 Device by Does not apply route daily.  . colchicine 0.6 MG tablet Take 1 tablet (0.6 mg total) by mouth daily.  Marland Kitchen enoxaparin (LOVENOX) 150 MG/ML injection Inject 150 mg into the skin daily.   Marland Kitchen LORazepam (ATIVAN) 0.5 MG tablet Take 0.5 mg by mouth at bedtime as needed for anxiety.  . metoCLOPramide (REGLAN) 10 MG tablet Take 1 tablet (10 mg total) by mouth every 8 (eight) hours as needed for refractory nausea / vomiting. (Patient taking differently: Take 10 mg by mouth daily. )  . Nutritional Supplements (FEEDING SUPPLEMENT, OSMOLITE 1.5 CAL,) LIQD Place 240 mLs into feeding tube 5 (five) times daily.  . Nutritional Supplements (PROMOD) LIQD 40ms via PEG BID.  .Marland Kitchenondansetron (ZOFRAN ODT) 4 MG disintegrating tablet Take 1 tablet (4 mg total) by mouth every 8 (eight) hours as needed for nausea or vomiting.  .Marland Kitchenosimertinib mesylate (TAGRISSO) 80 MG tablet Take 1 tablet (80 mg total) by mouth daily.  . pantoprazole (PROTONIX) 40 MG tablet Take 40 mg by mouth daily.   . potassium chloride (K-DUR) 10 MEQ tablet Take 2 tablets (20 mEq total) by  mouth 2 (two) times daily.  . potassium chloride (K-DUR) 10 MEQ tablet Take 10 mEq by mouth 2 (two) times daily.   . prochlorperazine (COMPAZINE) 10 MG tablet Take 1 tablet (10 mg total) by mouth every 6 (six) hours as needed for nausea or vomiting. (Patient taking differently: Take 10 mg by mouth 2 (two) times daily. )  . TRUEplus Lancets 30G MISC   . Water For Irrigation, Sterile (FREE WATER) SOLN Place 200 mLs into feeding tube every 6 (six) hours.   No facility-administered encounter medications on file as of 10/01/2018.     ALLERGIES:  Allergies  Allergen Reactions  . Banana Anaphylaxis  . Aspirin Other (See Comments)    G.I. Upset      PHYSICAL EXAM:  ECOG Performance status: 2  Vitals:   10/01/18 1155  BP: 127/83  Pulse: (!) 103  Resp: 16  Temp: 98.4 F (36.9 C)  SpO2: 100%   Filed Weights   10/01/18 1155  Weight: 159 lb (72.1 kg)    Physical Exam Vitals signs reviewed.  Constitutional:      Appearance: Normal appearance.  Cardiovascular:     Rate and Rhythm: Normal rate and regular rhythm.     Heart sounds: Normal heart sounds.  Pulmonary:     Effort: Pulmonary effort is normal.     Breath sounds: Normal breath sounds.  Abdominal:     General: There is no distension.     Palpations: Abdomen is soft. There is no mass.  Musculoskeletal:        General: No swelling.  Skin:    General: Skin is warm.  Neurological:     General: No focal deficit present.     Mental Status: She is alert and oriented to person, place, and time.  Psychiatric:        Mood and Affect: Mood normal.        Behavior: Behavior normal.      LABORATORY DATA:  I have reviewed the labs as listed.  CBC    Component Value Date/Time   WBC 4.7 10/01/2018 1111   RBC 4.78 10/01/2018 1111   HGB 12.4 10/01/2018 1111   HCT 39.8 10/01/2018 1111   PLT 267 10/01/2018 1111   MCV 83.3 10/01/2018 1111   MCH 25.9 (L) 10/01/2018 1111   MCHC 31.2 10/01/2018 1111   RDW 17.7 (H) 10/01/2018  1111   LYMPHSABS 0.7 10/01/2018 1111   MONOABS 0.7 10/01/2018 1111   EOSABS 0.7 (H) 10/01/2018 1111   BASOSABS 0.0 10/01/2018 1111   CMP Latest Ref Rng & Units 10/01/2018 09/17/2018 09/12/2018  Glucose 70 - 99 mg/dL 178(H) 101(H) 75  BUN 8 - 23 mg/dL 13 9 7(L)  Creatinine 0.44 - 1.00 mg/dL 0.85 0.78 0.67  Sodium 135 - 145 mmol/L 133(L) 137 138  Potassium 3.5 - 5.1 mmol/L 4.1 3.1(L) 3.2(L)  Chloride 98 - 111 mmol/L 94(L) 101 110  CO2 22 - 32 mmol/L '27 28 23  '$ Calcium 8.9 - 10.3 mg/dL 9.3 9.0 8.5(L)  Total Protein 6.5 - 8.1 g/dL 7.2 6.3(L) -  Total Bilirubin 0.3 - 1.2 mg/dL 0.2(L) 0.1(L) -  Alkaline Phos 38 - 126 U/L 73 56 -  AST 15 - 41 U/L 24 17 -  ALT 0 - 44 U/L 23 11 -       DIAGNOSTIC IMAGING:  I have independently reviewed the scans and discussed with the patient.   I have reviewed Venita Lick LPN's note and agree with the documentation.  I personally performed a face-to-face visit, made revisions and my assessment and plan is as follows.    ASSESSMENT & PLAN:   Metastatic lung cancer (metastasis from lung to other site), left ([place]) 1.  Metastatic adenocarcinoma of the left lung with multiple brain mets: -Guardant 360 showing EGFR exon 19 deletion (EGFR [id number]) - Presentation to ER on 04/24/2018 with shortness of breath.  She was never smoker. -CT PE protocol showed large pulmonary embolus in the descending interlobar pulmonary artery with occlusion of the right lower lobe pulmonary artery and additional small clots noted in the right middle lobe  and right upper lobes with associated right pleural effusion and right lower lobe infarct.  It also showed incidental left lower lobe mass measuring 3.8 x 3.7 x 2.1 cm associated with hilar and mediastinal adenopathy. - PET/CT scan on 05/14/2018 shows hypermetabolic left lower lobe lung mass measuring 3.7 cm, SUV of 11.  Bilateral hilar adenopathy and subcarinal adenopathy.  Both adrenal glands appear thickened and irregular  with increased SUV.  Right lower lobe airspace disease has hypermetabolic rim and a hypermetabolic nodule medial to the main airspace disease.  This may represent evolving infection/inflammation. - MRI of the brain on 05/30/2018 shows 20 subcentimeter enhancing brain lesions consistent with metastasis, no edema. - Left lower lobe lung biopsy on 06/18/2018 consistent with adenocarcinoma.  There was not enough tissue to run molecular testing. - Biopsy of the right supraclavicular lymph node on 07/11/2018 was consistent with metastatic adenocarcinoma. -Osimertinib 80 mg daily started on 07/28/2018. -She is tolerating the pill very well.  She complains of itching of her skin.  She was told to use moisturizing lotion.  There is no rash on the skin. -Her lab work was within normal limits.  Potassium was normal.  She will continue potassium twice daily. - I will see her back in 2 weeks for follow-up with repeat blood count.  I plan to repeat CT scans at that visit.   2.  Recurrent pulmonary embolism: -This is likely secondary to active malignancy.  CT PE protocol on 04/24/2018 shows PE in the right lower lobe pulmonary artery.  She was started on Eliquis. -CT of the chest with contrast on 07/10/2018 shows bilateral pulmonary emboli, on the left lung new since diagnosis. -She will continue Lovenox injection once daily.  3.  Weight loss: -She has PEG tube placed on 09/12/2018. -She is taking in 4 cans of Osmolite daily.  She also drinks about 3 cans of Ensure.  She is eating some soft foods.  She is feeling a lump in her throat. -Her weight is 159 pounds today and is more or less stable.         Total time spent is 25 minutes with more than 50% of the time spent face-to-face discussing and reinforcing treatment plan and coordination of care.     Orders placed this encounter:  No orders of the defined types were placed in this encounter.     Derek Jack, MD [facility]  [phone]

## 2018-10-01 NOTE — Assessment & Plan Note (Signed)
1.  Metastatic adenocarcinoma of the left lung with multiple brain mets: -Guardant 360 showing EGFR exon 19 deletion (EGFR [id number]) - Presentation to ER on 04/24/2018 with shortness of breath.  She was never smoker. -CT PE protocol showed large pulmonary embolus in the descending interlobar pulmonary artery with occlusion of the right lower lobe pulmonary artery and additional small clots noted in the right middle lobe and right upper lobes with associated right pleural effusion and right lower lobe infarct.  It also showed incidental left lower lobe mass measuring 3.8 x 3.7 x 2.1 cm associated with hilar and mediastinal adenopathy. - PET/CT scan on 05/14/2018 shows hypermetabolic left lower lobe lung mass measuring 3.7 cm, SUV of 11.  Bilateral hilar adenopathy and subcarinal adenopathy.  Both adrenal glands appear thickened and irregular with increased SUV.  Right lower lobe airspace disease has hypermetabolic rim and a hypermetabolic nodule medial to the main airspace disease.  This may represent evolving infection/inflammation. - MRI of the brain on 05/30/2018 shows 20 subcentimeter enhancing brain lesions consistent with metastasis, no edema. - Left lower lobe lung biopsy on 06/18/2018 consistent with adenocarcinoma.  There was not enough tissue to run molecular testing. - Biopsy of the right supraclavicular lymph node on 07/11/2018 was consistent with metastatic adenocarcinoma. -Osimertinib 80 mg daily started on 07/28/2018. -She is tolerating the pill very well.  She complains of itching of her skin.  She was told to use moisturizing lotion.  There is no rash on the skin. -Her lab work was within normal limits.  Potassium was normal.  She will continue potassium twice daily. - I will see her back in 2 weeks for follow-up with repeat blood count.  I plan to repeat CT scans at that visit.   2.  Recurrent pulmonary embolism: -This is likely secondary to active malignancy.  CT PE protocol on  04/24/2018 shows PE in the right lower lobe pulmonary artery.  She was started on Eliquis. -CT of the chest with contrast on 07/10/2018 shows bilateral pulmonary emboli, on the left lung new since diagnosis. -She will continue Lovenox injection once daily.  3.  Weight loss: -She has PEG tube placed on 09/12/2018. -She is taking in 4 cans of Osmolite daily.  She also drinks about 3 cans of Ensure.  She is eating some soft foods.  She is feeling a lump in her throat. -Her weight is 159 pounds today and is more or less stable.

## 2018-10-01 NOTE — Patient Instructions (Signed)
[place] at [facility] Discharge Instructions  You were seen today by Dr. Delton Coombes. He went over your recent lab results your numbers look better. He will see you back in 2 weeks for labs and follow up.   Thank you for choosing [place] at [facility] to provide your oncology and hematology care.  To afford each patient quality time with our provider, please arrive at least 15 minutes before your scheduled appointment time.   If you have a lab appointment with the [facility] please come in thru the  Main Entrance and check in at the main information desk  You need to re-schedule your appointment should you arrive 10 or more minutes late.  We strive to give you quality time with our providers, and arriving late affects you and other patients whose appointments are after yours.  Also, if you no show three or more times for appointments you may be dismissed from the clinic at the providers discretion.     Again, thank you for choosing [facility].  Our hope is that these requests will decrease the amount of time that you wait before being seen by our physicians.       _____________________________________________________________  Should you have questions after your visit to [facility], please contact our office at [phone] between the hours of 8:00 a.m. and 4:30 p.m.  Voicemails left after 4:00 p.m. will not be returned until the following business day.  For prescription refill requests, have your pharmacy contact our office and allow 72 hours.    Cancer Center Support Programs:   > Cancer Support Group  2nd Tuesday of the month 1pm-2pm, Journey Room

## 2018-10-02 ENCOUNTER — Encounter: Payer: Self-pay | Admitting: General Practice

## 2018-10-02 NOTE — Progress Notes (Signed)
[facility] CSW Progress Note  Patient reports food insecurity, would like to enroll in [facility] food box program, agreeable to program contacting him to set up delivery.  Edwyna Shell, LCSW Clinical Social Worker Phone:  [phone]

## 2018-10-03 ENCOUNTER — Other Ambulatory Visit (HOSPITAL_COMMUNITY): Payer: Self-pay | Admitting: *Deleted

## 2018-10-03 DIAGNOSIS — C3492 Malignant neoplasm of unspecified part of left bronchus or lung: Secondary | ICD-10-CM

## 2018-10-03 MED ORDER — OSIMERTINIB MESYLATE 80 MG PO TABS: 80.0000 mg | ORAL_TABLET | Freq: Every day | ORAL | 1 refills | Status: DC

## 2018-10-03 NOTE — Progress Notes (Unsigned)
Rx for [facility] and given to A. Sharlet Salina, Development worker, community, to refill.  Pt's sister called stating pt has run out of her medication.

## 2018-10-07 ENCOUNTER — Encounter (HOSPITAL_COMMUNITY): Payer: Self-pay | Admitting: Dietician

## 2018-10-07 DIAGNOSIS — C3490 Malignant neoplasm of unspecified part of unspecified bronchus or lung: Secondary | ICD-10-CM | POA: Diagnosis not present

## 2018-10-07 DIAGNOSIS — R1310 Dysphagia, unspecified: Secondary | ICD-10-CM | POA: Diagnosis not present

## 2018-10-07 DIAGNOSIS — C7800 Secondary malignant neoplasm of unspecified lung: Secondary | ICD-10-CM | POA: Diagnosis not present

## 2018-10-07 DIAGNOSIS — Z431 Encounter for attention to gastrostomy: Secondary | ICD-10-CM | POA: Diagnosis not present

## 2018-10-07 DIAGNOSIS — E43 Unspecified severe protein-calorie malnutrition: Secondary | ICD-10-CM | POA: Diagnosis not present

## 2018-10-07 DIAGNOSIS — E119 Type 2 diabetes mellitus without complications: Secondary | ICD-10-CM | POA: Diagnosis not present

## 2018-10-07 NOTE — Progress Notes (Signed)
Nutrition Assessment  ASSESSMENT:  68 y/o female w/ PMHx HTN, DM2, Stage  IV adenocarcinoma of lung. Dx in Dec. with 12/18 PET findings consistent w/ metastatic disease. First seen at [facility] on 12/23. MRI brain 1/3 showed ~20 brain mets. Lung Biopsy 1/22 + for adenocarcinoma.  Begun oral chemo 3/2. S/P WBRT 3/3. D/t intractable nausea and resultant FTT, pt is s/p hospitalization 4/16-4/18 w/ PEG placement and initiation of TF.   Checking in again with patient regarding her TF, which was initiated 3 and a half weeks ago.   Was able to reach pt by phone. Pt reports her TF intake as the same as last week. She is infusing a total of 3-4 cans a day. She likes to start the day with half can boluses and increases to whole can boluses later in the day. She says she has tried to administer 5 cans a day, but "my stomach cant handle it".  She still is consuming a large amount of Boost supplements 3-4 each day. Did not clarify which specific kind of Boost.   Orally, she says she does not have a taste for food anymore and this is saddening to the pt. She says she gets the majority of her nutrition with TF and oral supplements. She says she may briefly get hungry for a certain food, but when she takes a few bites, she no longer wants it. She shares she was at a family picnic this past week where there were items such as ribs, tossed salad, corn etc etc. She says she only was able to take a couple bites of a single item. She says she was very sad she did not have the appetite to partake. She says "can you give me weed or something...just something to give me an appetite, that is all I want.Marland KitchenMarland KitchenI want to be able to eat actually food".   She is not administering free water boluses via tube; she feels she consumes enough fluids by mouth. She says her family picked her up "plus water" from [employer]. She also drinks regular water orally.   She has not had any nausea. She denies any constipation or diarrhea, and her bowels  are now regular.    Weight wise, pt was 159 lbs at her appt 5/6. Unfortunately, this is down 4 lbs from a couple weeks prior. Her wt is now the same as it was at time of PEG placement on 4/17. Her wt is only stable in the immediate short term. Long term, she is down 30 lbs x3 months (15.5% bw) and ~40 lbs since she was diagnosed at the end of last November (=19.7% bw in <6 months).   Wt Readings from Last 10 Encounters:  10/01/18 159 lb (72.1 kg)  09/17/18 163 lb (73.9 kg)  09/11/18 159 lb 13.3 oz (72.5 kg)  08/26/18 168 lb 12.8 oz (76.6 kg)  08/15/18 168 lb (76.2 kg)  08/06/18 169 lb 9.6 oz (76.9 kg)  08/05/18 174 lb (78.9 kg)  08/05/18 170 lb 4.8 oz (77.2 kg)  07/15/18 185 lb 3 oz (84 kg)  07/06/18 189 lb 13.1 oz (86.1 kg)   MEDICATIONS: Chemo: Tagrisso  Supportive medications: KCL, Compazine, ppi, ativan, zofran, reglan   LABS: 5/6: Albumin: 3.2 (up from 2.8 2 wks prior).  Na: 133 (down from 137 2 wks prior) Renal labs roughly stable.   Recent Labs  Lab 10/01/18 1111  NA 133*  K 4.1  CL 94*  CO2 27  BUN 13  CREATININE 0.85  CALCIUM 9.3  GLUCOSE 178*   ANTHROPOMETRICS: Height:  Ht Readings from Last 1 Encounters:  09/11/18 '5\' 6"'$  (1.676 m)   Weight:  Wt Readings from Last 1 Encounters:  10/01/18 159 lb (72.1 kg)   BMI:  BMI Readings from Last 1 Encounters:  10/01/18 25.66 kg/m   UBW: 213 lbs (per pt report) Wt changes (based on wt 5/6):  Stable x3 weeks -39 lbs (19.5% bw) since dx at end of Nov (198 at that time)  ESTIMATED ENERGY NEEDS:  Kcal:  1950-2150 kcals (27-30 kcal/kg bw) Protein:  94-108g Pro (1.3-1.5g/kg bw) Fluid:  2-2.2 L fluid (63m/kcal)  NUTRITION DIAGNOSIS:  Increased nutrient needs r/t cancer and cancer related treatments as evidenced by the nutrition recommendations for this disease state.   DOCUMENTATION CODES:  Not applicable  INTERVENTION:  Pt seems to be doing well with the tube feeding. She and her family have incorporated  the TF in a way that still allows pt to eat during the day. She is tolerating her feeds and, since cessation of the promod, she has not had any nausea or loose stools. Pt says she has no complaints regarding the TF.   Patients total intake still sounds adequate. Her supplemental TF regimen (3-4 cans Osmolite 1.5) is providing her: 1065-1420 kcals, 45-60g Pro and 543-724 mls fluid (+~300 from flushes). She is receiving atleast another 700 kcals and 40g Pro from the 3-4 Boost supplements she drinks. She also receives a small additional amount of nutrition from the various bites/sips she takes during the day. Based on this, she is estimated to be meeting ~100% of kcal/pro needs.   She has not been infusing the free water boluses, but sounds to be drinking enough fluids by mouth.   Despite an oral intake felt to be adequate, her wt is not yet showing increase. Will clarify next week which Boost patient is consuming. It is possible that pt is no longer able to gain wt d/t anabolic dysregulation in setting of advanced cancer. If this is case, wt maintenance will be goal and it does appear her weight is stable since PEG/TF initiated.   Pt is disappointed in her anorexia. She says this is drastically affecting her QOL and all she wants is to be able to eat "regular food". She is asking for any medication that could help her appetite. RD noted there are some appetite stimulating medications, but they all have side effects. RD told pt he will discuss this with MD/NP and this can be brought up at her next office visit.    Will follow up x1 week. Pt has RDs number if issues arise.   GOAL:  Pt will be able to tolerate 5 cans of TF/day OR will demonstrate sufficient oral intake to meet >90% of needs when combined with tube feeding -BELIEVED MET  MONITOR:  Chemotherapy tolerance, labs, weights, nausea, oral intake, diet tolerance, TF tolerance    Next Visit: 5/19 - Remote   NBurtis JunesRD, LDN,  CNSC Clinical Nutrition Available Tues-Sat via Pager: [phone]/04/2019 9:44 AM

## 2018-10-09 ENCOUNTER — Other Ambulatory Visit (HOSPITAL_COMMUNITY): Payer: Self-pay | Admitting: Nurse Practitioner

## 2018-10-09 DIAGNOSIS — R110 Nausea: Secondary | ICD-10-CM

## 2018-10-10 ENCOUNTER — Other Ambulatory Visit: Payer: Self-pay | Admitting: Radiation Therapy

## 2018-10-10 DIAGNOSIS — C7931 Secondary malignant neoplasm of brain: Secondary | ICD-10-CM

## 2018-10-10 DIAGNOSIS — C7949 Secondary malignant neoplasm of other parts of nervous system: Secondary | ICD-10-CM

## 2018-10-13 DIAGNOSIS — C7800 Secondary malignant neoplasm of unspecified lung: Secondary | ICD-10-CM | POA: Diagnosis not present

## 2018-10-13 DIAGNOSIS — C3490 Malignant neoplasm of unspecified part of unspecified bronchus or lung: Secondary | ICD-10-CM | POA: Diagnosis not present

## 2018-10-13 DIAGNOSIS — E119 Type 2 diabetes mellitus without complications: Secondary | ICD-10-CM | POA: Diagnosis not present

## 2018-10-13 DIAGNOSIS — R1310 Dysphagia, unspecified: Secondary | ICD-10-CM | POA: Diagnosis not present

## 2018-10-13 DIAGNOSIS — E43 Unspecified severe protein-calorie malnutrition: Secondary | ICD-10-CM | POA: Diagnosis not present

## 2018-10-13 DIAGNOSIS — Z431 Encounter for attention to gastrostomy: Secondary | ICD-10-CM | POA: Diagnosis not present

## 2018-10-14 ENCOUNTER — Encounter (HOSPITAL_COMMUNITY): Payer: Self-pay | Admitting: Dietician

## 2018-10-14 DIAGNOSIS — E43 Unspecified severe protein-calorie malnutrition: Secondary | ICD-10-CM | POA: Diagnosis not present

## 2018-10-14 DIAGNOSIS — C7800 Secondary malignant neoplasm of unspecified lung: Secondary | ICD-10-CM | POA: Diagnosis not present

## 2018-10-14 DIAGNOSIS — C3490 Malignant neoplasm of unspecified part of unspecified bronchus or lung: Secondary | ICD-10-CM | POA: Diagnosis not present

## 2018-10-14 DIAGNOSIS — R1310 Dysphagia, unspecified: Secondary | ICD-10-CM | POA: Diagnosis not present

## 2018-10-14 DIAGNOSIS — Z431 Encounter for attention to gastrostomy: Secondary | ICD-10-CM | POA: Diagnosis not present

## 2018-10-14 DIAGNOSIS — E119 Type 2 diabetes mellitus without complications: Secondary | ICD-10-CM | POA: Diagnosis not present

## 2018-10-14 NOTE — Progress Notes (Signed)
Nutrition Assessment  ASSESSMENT:  68 y/o female w/ PMHx HTN, DM2, Stage  IV adenocarcinoma of lung. Dx in Dec. with 12/18 PET findings consistent w/ metastatic disease. First seen at [facility] on 12/23. MRI brain 1/3 showed ~20 brain mets. Lung Biopsy 1/22 + for adenocarcinoma.  Begun oral chemo 3/2. S/P WBRT 3/3. D/t intractable nausea and resultant FTT, pt is s/p hospitalization 4/16-4/18 w/ PEG placement and initiation of TF.   Checking in again with patient regarding her TF, which was initiated 4 and a half weeks ago.    Was able to reach pt by phone. She is more or less unchanged. She is still infusing a total of ~4 cans a day. She still is working up to 5 cans.  She likes to start the day with half-can boluses and increases to whole can boluses later in the day.   She still is consuming a large amount of Boost supplements (3-4) each day. She clarifies today that this is Boost Original (240 kcals, 10g Pro).   She still is sad about her lack of appetite. She does not have a taste for food anymore. She says she may briefly get hungry for a certain food, but when she takes a few bites, she no longer wants it. RD asked if she thought cutting back on the TF/boost would help. If it would improve her QOL, RD would be open to discussing this. However, she did not think it would make any difference; she says her appetite stays poor regardless. She still is very much interested in any medication that could give her an appetite.   She denies any s/s dehydration. She denies dizziness or dark urine. She has a nurse come by often to check on her.   Other than anorexia, she does not report dealing with any symptoms or side effects. Her nausea is still at [place] and she has not had any n/v. She is having regular bowel movements.   Pt says she is weighing her self at home. She says her last weight was 164 lbs. If accurate, pt would have gained back the weight she lost at her last appointment (she was 159 lbs at her  appt 5/6). She was 159 lbs at time of PEG placement on 4/17. Based on last "official weight", pt is down 30 lbs x3 months (15.5% bw) and ~40 lbs since she was diagnosed at the end of last November (=19.7% bw in <6 months).   Wt Readings from Last 10 Encounters:  10/01/18 159 lb (72.1 kg)  09/17/18 163 lb (73.9 kg)  09/11/18 159 lb 13.3 oz (72.5 kg)  08/26/18 168 lb 12.8 oz (76.6 kg)  08/15/18 168 lb (76.2 kg)  08/06/18 169 lb 9.6 oz (76.9 kg)  08/05/18 174 lb (78.9 kg)  08/05/18 170 lb 4.8 oz (77.2 kg)  07/15/18 185 lb 3 oz (84 kg)  07/06/18 189 lb 13.1 oz (86.1 kg)   MEDICATIONS: Chemo: Tagrisso  Supportive medications: KCL, Compazine, ppi, ativan, zofran, reglan   LABS: No new labs since last week.   ANTHROPOMETRICS: Height:  Ht Readings from Last 1 Encounters:  09/11/18 _0  (1.676 m)   Weight:  Wt Readings from Last 1 Encounters:  10/01/18 159 lb (72.1 kg)   BMI:  BMI Readings from Last 1 Encounters:  10/01/18 25.66 kg/m   UBW: 213 lbs (per pt report) Wt changes (based on wt 5/6):  Stable x3 weeks -39 lbs (19.5% bw) since dx at end of Nov (198 at  that time)  ESTIMATED ENERGY NEEDS:  Kcal:  1950-2150 kcals (27-30 kcal/kg bw) Protein:  94-108g Pro (1.3-1.5g/kg bw) Fluid:  2-2.2 L fluid (76m/kcal)  NUTRITION DIAGNOSIS:  Increased nutrient needs r/t cancer and cancer related treatments as evidenced by the nutrition recommendations for this disease state.   DOCUMENTATION CODES:  Not applicable  INTERVENTION:  Pt is stable. She and her family have incorporated the TF in a way that still allows pt to eat during the day. She is tolerating her feeds and, since cessation of the promod, she has not had any nausea or loose stools. Pt says she has no complaints regarding the TF. She still is pushing to get 5 cans in a day.   Patients total intake still sounds adequate. Her supplemental TF regimen (4 cans Osmolite 1.5) is providing her: 1420 kcals, 60g Pro and 724 mls  fluid (+~300 from flushes). She is receiving atleast another 720 kcals and 30g Pro from the 3-4 Boost Original supplements she drinks. She also receives a small additional amount of nutrition from the various bites/sips she takes during the day. Based on this, she is estimated to be meeting ~100% of kcal/pro needs.   She has not been infusing the free water boluses, but sounds to be drinking enough fluids by mouth. She denies s/s of dehydration.   Pt is disappointed w/ her anorexia. She says this is negatively affecting her QOL and all she wants is to be able to eat "regular food". She is still interested in any medication that could help her appetite. . RD told pt he will discuss this with MD/NP and this can be brought up at her next office visit.    Will follow up x1 week. Pt has RDs number if issues arise.   GOAL:  Pt will be able to tolerate 5 cans of TF/day OR will demonstrate sufficient oral intake to meet >90% of needs when combined with tube feeding -BELIEVED MET  MONITOR:  Chemotherapy tolerance, labs, weights, nausea, oral intake, diet tolerance, TF tolerance    Next Visit: 5/26 - Remote   NBurtis JunesRD, LDN, CNSC Clinical Nutrition Available Tues-Sat via Pager: [phone]/19/2020 10:06 AM

## 2018-10-15 ENCOUNTER — Telehealth: Payer: Self-pay | Admitting: Radiation Therapy

## 2018-10-15 ENCOUNTER — Other Ambulatory Visit (HOSPITAL_COMMUNITY): Payer: Self-pay | Admitting: *Deleted

## 2018-10-15 DIAGNOSIS — C3492 Malignant neoplasm of unspecified part of left bronchus or lung: Secondary | ICD-10-CM

## 2018-10-15 DIAGNOSIS — C7931 Secondary malignant neoplasm of brain: Secondary | ICD-10-CM

## 2018-10-15 DIAGNOSIS — C3491 Malignant neoplasm of unspecified part of right bronchus or lung: Secondary | ICD-10-CM

## 2018-10-15 DIAGNOSIS — C3490 Malignant neoplasm of unspecified part of unspecified bronchus or lung: Secondary | ICD-10-CM | POA: Diagnosis not present

## 2018-10-15 NOTE — Telephone Encounter (Signed)
Left a detailed message on her home voicemail about the brain MRI and follow-up with Ashlyln in June. I also left my contact information for her to call back if she has questions or a conflict.   Mont Dutton R.T.(R)(T) Special Procedures Navigator

## 2018-10-16 ENCOUNTER — Encounter (HOSPITAL_COMMUNITY): Payer: Self-pay | Admitting: Hematology

## 2018-10-16 ENCOUNTER — Inpatient Hospital Stay (HOSPITAL_COMMUNITY): Payer: Medicare HMO

## 2018-10-16 ENCOUNTER — Other Ambulatory Visit: Payer: Self-pay

## 2018-10-16 ENCOUNTER — Inpatient Hospital Stay (HOSPITAL_BASED_OUTPATIENT_CLINIC_OR_DEPARTMENT_OTHER): Payer: Medicare HMO | Admitting: Hematology

## 2018-10-16 VITALS — BP 112/76 | HR 114 | Temp 98.6°F | Resp 18 | Wt 162.2 lb

## 2018-10-16 DIAGNOSIS — C7931 Secondary malignant neoplasm of brain: Secondary | ICD-10-CM

## 2018-10-16 DIAGNOSIS — Z7901 Long term (current) use of anticoagulants: Secondary | ICD-10-CM | POA: Diagnosis not present

## 2018-10-16 DIAGNOSIS — Z79899 Other long term (current) drug therapy: Secondary | ICD-10-CM | POA: Diagnosis not present

## 2018-10-16 DIAGNOSIS — C3492 Malignant neoplasm of unspecified part of left bronchus or lung: Secondary | ICD-10-CM

## 2018-10-16 DIAGNOSIS — R634 Abnormal weight loss: Secondary | ICD-10-CM

## 2018-10-16 DIAGNOSIS — C3491 Malignant neoplasm of unspecified part of right bronchus or lung: Secondary | ICD-10-CM

## 2018-10-16 DIAGNOSIS — Z931 Gastrostomy status: Secondary | ICD-10-CM

## 2018-10-16 DIAGNOSIS — I2699 Other pulmonary embolism without acute cor pulmonale: Secondary | ICD-10-CM

## 2018-10-16 DIAGNOSIS — G4700 Insomnia, unspecified: Secondary | ICD-10-CM

## 2018-10-16 LAB — CBC WITH DIFFERENTIAL/PLATELET
Abs Immature Granulocytes: 0.06 10*3/uL (ref 0.00–0.07)
Basophils Absolute: 0 10*3/uL (ref 0.0–0.1)
Basophils Relative: 0 %
Eosinophils Absolute: 0.5 10*3/uL (ref 0.0–0.5)
Eosinophils Relative: 7 %
HCT: 39.5 % (ref 36.0–46.0)
Hemoglobin: 12.5 g/dL (ref 12.0–15.0)
Immature Granulocytes: 1 %
Lymphocytes Relative: 14 %
Lymphs Abs: 1 10*3/uL (ref 0.7–4.0)
MCH: 26.2 pg (ref 26.0–34.0)
MCHC: 31.6 g/dL (ref 30.0–36.0)
MCV: 82.8 fL (ref 80.0–100.0)
Monocytes Absolute: 1.2 10*3/uL — ABNORMAL HIGH (ref 0.1–1.0)
Monocytes Relative: 17 %
Neutro Abs: 4.1 10*3/uL (ref 1.7–7.7)
Neutrophils Relative %: 61 %
Platelets: 353 10*3/uL (ref 150–400)
RBC: 4.77 MIL/uL (ref 3.87–5.11)
RDW: 17.3 % — ABNORMAL HIGH (ref 11.5–15.5)
WBC: 6.8 10*3/uL (ref 4.0–10.5)
nRBC: 0 % (ref 0.0–0.2)

## 2018-10-16 LAB — COMPREHENSIVE METABOLIC PANEL
ALT: 23 U/L (ref 0–44)
AST: 23 U/L (ref 15–41)
Albumin: 3.5 g/dL (ref 3.5–5.0)
Alkaline Phosphatase: 70 U/L (ref 38–126)
Anion gap: 12 (ref 5–15)
BUN: 20 mg/dL (ref 8–23)
CO2: 24 mmol/L (ref 22–32)
Calcium: 9.5 mg/dL (ref 8.9–10.3)
Chloride: 98 mmol/L (ref 98–111)
Creatinine, Ser: 0.77 mg/dL (ref 0.44–1.00)
GFR calc Af Amer: >60 mL/min (ref >60)
GFR calc non Af Amer: >60 mL/min (ref >60)
Glucose, Bld: 120 mg/dL — ABNORMAL HIGH (ref 70–99)
Potassium: 3.8 mmol/L (ref 3.5–5.1)
Sodium: 134 mmol/L — ABNORMAL LOW (ref 135–145)
Total Bilirubin: 0.3 mg/dL (ref 0.3–1.2)
Total Protein: 7.9 g/dL (ref 6.5–8.1)

## 2018-10-16 LAB — MAGNESIUM: Magnesium: 2.1 mg/dL (ref 1.7–2.4)

## 2018-10-16 MED ORDER — ZOLPIDEM TARTRATE ER 6.25 MG PO TBCR: 6.2500 mg | EXTENDED_RELEASE_TABLET | Freq: Every evening | ORAL | 0 refills | Status: DC | PRN

## 2018-10-16 NOTE — Assessment & Plan Note (Signed)
1.  Metastatic adenocarcinoma of the left lung with multiple brain mets: -Guardant 360 showing EGFR exon 19 deletion (EGFR [id number]) - Presentation to ER on 04/24/2018 with shortness of breath.  She was never smoker. -CT PE protocol showed large pulmonary embolus in the descending interlobar pulmonary artery with occlusion of the right lower lobe pulmonary artery and additional small clots noted in the right middle lobe and right upper lobes with associated right pleural effusion and right lower lobe infarct.  It also showed incidental left lower lobe mass measuring 3.8 x 3.7 x 2.1 cm associated with hilar and mediastinal adenopathy. - PET/CT scan on 05/14/2018 shows hypermetabolic left lower lobe lung mass measuring 3.7 cm, SUV of 11.  Bilateral hilar adenopathy and subcarinal adenopathy.  Both adrenal glands appear thickened and irregular with increased SUV.  Right lower lobe airspace disease has hypermetabolic rim and a hypermetabolic nodule medial to the main airspace disease.  This may represent evolving infection/inflammation. - MRI of the brain on 05/30/2018 shows 20 subcentimeter enhancing brain lesions consistent with metastasis, no edema. - Left lower lobe lung biopsy on 06/18/2018 consistent with adenocarcinoma.  There was not enough tissue to run molecular testing. - Biopsy of the right supraclavicular lymph node on 07/11/2018 was consistent with metastatic adenocarcinoma. -Osimertinib 80 mg daily was started on 07/28/2018. -She denies any skin rashes or diarrhea.  Denies any severe tiredness.  She is continuing to do all her daily activities.   -She is continue potassium twice daily.  I have reviewed her blood work.  Potassium is normal. -I will see her back in 2 weeks for follow-up.  I plan to repeat CT scans CAP and MRI of the brain prior to next visit.   2.  Recurrent pulmonary embolism: -This is likely secondary to active malignancy.  CT PE protocol on 04/24/2018 shows PE in the right  lower lobe pulmonary artery.  She was started on Eliquis. -CT of the chest with contrast on 07/10/2018 shows bilateral pulmonary emboli, on the left lung new since diagnosis.  -She will continue Lovenox injection daily.  3.  Weight loss: -She is taking in 3 to 4 cans of Osmolite daily.  She is also drinking about 3 cans of Ensure.  She is eating about 1 small meal. -She does not report any good appetite. -I have reviewed her weight.  She gained about 2 to 3 pounds from last visit.

## 2018-10-16 NOTE — Progress Notes (Signed)
[place], [place] [address]   CLINIC:  Medical Oncology/Hematology  PCP:  [name], [address] [place] [id number] [phone]   REASON FOR VISIT:  Follow-up for metastatic lung cancer.     INTERVAL HISTORY:  Ms. Grandpre 68 y.o. female returns for routine follow-up. She is currently on Tagrisso and is tolerating well. She reports stable fatigue. Reports decreased appetite. Denies any dysphagia. She is drinking 3-4 ensures a day. She is doing 3-4 tube feedings a day. She also reports trouble staying asleep and would like something for sleep. Denies any nausea, vomiting, diarrhea, or abdominal pain. She denies any CP, SOB, lightheadedness or dizziness. She is doing all of her ADL independently.    REVIEW OF SYSTEMS:  Review of Systems  Constitutional: Positive for fatigue.  HENT:  Negative.   Eyes: Negative.   Respiratory: Negative.   Cardiovascular: Negative.   Gastrointestinal: Negative.   Endocrine: Negative.   Genitourinary: Negative.    Musculoskeletal: Negative.   Skin: Negative.   Neurological: Negative.   Hematological: Negative.   Psychiatric/Behavioral: Negative.      PAST MEDICAL/SURGICAL HISTORY:  Past Medical History:  Diagnosis Date  . Asthma   . Cancer ([place]) 04/21/2018   Lung Cancer   . Carpal tunnel syndrome   . Cataract    left eye  . Diabetes mellitus without complication ([place])   . DVT (deep venous thrombosis) ([place])   . Hypertension   . Pneumonia 04/21/2018  . Pulmonary embolism ([place]) 03/2018   Past Surgical History:  Procedure Laterality Date  . ABDOMINAL HYSTERECTOMY    . biopsy of right breast    . ESOPHAGOGASTRODUODENOSCOPY (EGD) WITH PROPOFOL N/A 09/12/2018   Procedure: ESOPHAGOGASTRODUODENOSCOPY (EGD) WITH PROPOFOL (scope in 1017, scope out 1032);  Surgeon: Virl Cagey, MD;  Location: [facility];  Service: General;  Laterality: N/A;  . FOOT SURGERY    . implant left eye     . INTRAOCULAR LENS INSERTION    . PEG PLACEMENT N/A 09/12/2018   Procedure: PERCUTANEOUS ENDOSCOPIC GASTROSTOMY (PEG) PLACEMENT;  Surgeon: Virl Cagey, MD;  Location: [facility];  Service: General;  Laterality: N/A;     SOCIAL HISTORY:  Social History   Socioeconomic History  . Marital status: Widowed    Spouse name: Not on file  . Number of children: 4  . Years of education: 64  . Highest education level: High school graduate  Occupational History  . Not on file  Social Needs  . Financial resource strain: Very hard  . Food insecurity:    Worry: Sometimes true    Inability: Sometimes true  . Transportation needs:    Medical: No    Non-medical: No  Tobacco Use  . Smoking status: Never Smoker  . Smokeless tobacco: Never Used  Substance and Sexual Activity  . Alcohol use: Not Currently    Comment: occ  . Drug use: No  . Sexual activity: Yes    Partners: Male    Birth control/protection: Surgical  Lifestyle  . Physical activity:    Days per week: 0 days    Minutes per session: Not on file  . Stress: Only a little  Relationships  . Social connections:    Talks on phone: More than three times a week    Gets together: More than three times a week    Attends religious service: More than 4 times per year    Active member of club  or organization: Yes    Attends meetings of clubs or organizations: More than 4 times per year    Relationship status: Widowed  . Intimate partner violence:    Fear of current or ex partner: No    Emotionally abused: No    Physically abused: No    Forced sexual activity: No  Other Topics Concern  . 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    FAMILY HISTORY:  Family History  Problem Relation Age of Onset  . Heart failure Mother    . Stroke Mother   . Heart attack Mother   . Kidney failure Other   . Leukemia Father     CURRENT MEDICATIONS:  Outpatient Encounter Medications as of 10/16/2018  Medication Sig  . acetaminophen (TYLENOL) 325 MG tablet Take 2 tablets (650 mg total) by mouth every 6 (six) hours as needed for mild pain (or Fever >/= 101).  Marland Kitchen albuterol (PROVENTIL HFA;VENTOLIN HFA) 108 (90 BASE) MCG/ACT inhaler Inhale 2 puffs into the lungs every 6 (six) hours as needed for wheezing.  Marland Kitchen albuterol (PROVENTIL) (2.5 MG/3ML) 0.083% nebulizer solution Take 3 mLs (2.5 mg total) by nebulization every 6 (six) hours as needed for wheezing or shortness of breath.  Marland Kitchen amLODipine (NORVASC) 5 MG tablet Take 5 mg by mouth daily.  . Blood Glucose Monitoring Suppl (TRUE METRIX METER) DEVI 1 Device by Does not apply route daily.  . colchicine 0.6 MG tablet Take 1 tablet (0.6 mg total) by mouth daily.  Marland Kitchen enoxaparin (LOVENOX) 150 MG/ML injection Inject 150 mg into the skin daily.   Marland Kitchen LORazepam (ATIVAN) 0.5 MG tablet Take 0.5 mg by mouth at bedtime as needed for anxiety.  . metoCLOPramide (REGLAN) 10 MG tablet Take 1 tablet (10 mg total) by mouth every 8 (eight) hours as needed for refractory nausea / vomiting. (Patient taking differently: Take 10 mg by mouth daily. )  . Nutritional Supplements (FEEDING SUPPLEMENT, OSMOLITE 1.5 CAL,) LIQD Place 240 mLs into feeding tube 5 (five) times daily.  . Nutritional Supplements (PROMOD) LIQD 87ms via PEG BID.  .Marland Kitchenondansetron (ZOFRAN ODT) 4 MG disintegrating tablet Take 1 tablet (4 mg total) by mouth every 8 (eight) hours as needed for nausea or vomiting.  .Marland Kitchenosimertinib mesylate (TAGRISSO) 80 MG tablet Take 1 tablet (80 mg total) by mouth daily.  . pantoprazole (PROTONIX) 40 MG tablet Take 40 mg by mouth daily.   . potassium chloride (K-DUR) 10 MEQ tablet Take 2 tablets (20 mEq total) by mouth 2 (two) times daily.  . potassium chloride (K-DUR) 10 MEQ tablet Take 10 mEq by mouth 2 (two) times  daily.   . prochlorperazine (COMPAZINE) 10 MG tablet TAKE (1) TABLET BY MOUTH EVERY SIX HOURS AS NEEDED FOR NAUSEA OR VOMITING.  . TRUEplus Lancets 30G MISC   . Water For Irrigation, Sterile (FREE WATER) SOLN Place 200 mLs into feeding tube every 6 (six) hours.   No facility-administered encounter medications on file as of 10/16/2018.     ALLERGIES:  Allergies  Allergen Reactions  . Banana Anaphylaxis  . Aspirin Other (See Comments)    G.I. Upset      PHYSICAL EXAM:  ECOG Performance status: 1  Vitals:   10/16/18 1400  BP: 112/76  Pulse: (!) 114  Resp: 18  Temp: 98.6 F (37 C)  SpO2: 100%   Filed Weights   10/16/18 1400  Weight: 162 lb 3 oz (73.6 kg)    Physical Exam Vitals signs reviewed.  Constitutional:      Appearance: Normal appearance.  Cardiovascular:     Rate and Rhythm: Normal rate and regular rhythm.     Heart sounds: Normal heart sounds.  Pulmonary:     Effort: Pulmonary effort is normal.     Breath sounds: Normal breath sounds.  Abdominal:     General: There is no distension.     Palpations: Abdomen is soft. There is no mass.  Musculoskeletal:        General: No swelling.  Skin:    General: Skin is warm.  Neurological:     General: No focal deficit present.     Mental Status: She is alert and oriented to person, place, and time.  Psychiatric:        Mood and Affect: Mood normal.        Behavior: Behavior normal.      LABORATORY DATA:  I have reviewed the labs as listed.  CBC    Component Value Date/Time   WBC 6.8 10/16/2018 1337   RBC 4.77 10/16/2018 1337   HGB 12.5 10/16/2018 1337   HCT 39.5 10/16/2018 1337   PLT 353 10/16/2018 1337   MCV 82.8 10/16/2018 1337   MCH 26.2 10/16/2018 1337   MCHC 31.6 10/16/2018 1337   RDW 17.3 (H) 10/16/2018 1337   LYMPHSABS 1.0 10/16/2018 1337   MONOABS 1.2 (H) 10/16/2018 1337   EOSABS 0.5 10/16/2018 1337   BASOSABS 0.0 10/16/2018 1337   CMP Latest Ref Rng & Units 10/16/2018 10/01/2018 09/17/2018   Glucose 70 - 99 mg/dL 120(H) 178(H) 101(H)  BUN 8 - 23 mg/dL _0 Creatinine 0.44 - 1.00 mg/dL 0.77 0.85 0.78  Sodium 135 - 145 mmol/L 134(L) 133(L) 137  Potassium 3.5 - 5.1 mmol/L 3.8 4.1 3.1(L)  Chloride 98 - 111 mmol/L 98 94(L) 101  CO2 22 - 32 mmol/L _1 Calcium 8.9 - 10.3 mg/dL 9.5 9.3 9.0  Total Protein 6.5 - 8.1 g/dL 7.9 7.2 6.3(L)  Total Bilirubin 0.3 - 1.2 mg/dL 0.3 0.2(L) 0.1(L)  Alkaline Phos 38 - 126 U/L 70 73 56  AST 15 - 41 U/L _2 ALT 0 - 44 U/L _3 DIAGNOSTIC IMAGING:  I have independently reviewed the scans and discussed with the patient.      ASSESSMENT & PLAN:   Metastatic lung cancer (metastasis from lung to other site), left ([place]) 1.  Metastatic adenocarcinoma of the left lung with multiple brain mets: -Guardant 360 showing EGFR exon 19 deletion (EGFR [id number]) - Presentation to ER on 04/24/2018 with shortness of breath.  She was never smoker. -CT PE protocol showed large pulmonary embolus in the descending interlobar pulmonary artery with occlusion of the right lower lobe pulmonary artery and additional small clots noted in the right middle lobe and right upper lobes with associated right pleural effusion and right lower lobe infarct.  It also showed incidental left lower lobe mass measuring 3.8 x 3.7 x 2.1 cm associated with hilar and mediastinal adenopathy. - PET/CT scan on 05/14/2018 shows hypermetabolic left lower lobe lung mass measuring 3.7 cm, SUV of 11.  Bilateral hilar adenopathy and subcarinal adenopathy.  Both adrenal glands appear thickened and irregular  with increased SUV.  Right lower lobe airspace disease has hypermetabolic rim and a hypermetabolic nodule medial to the main airspace disease.  This may represent evolving infection/inflammation. - MRI of the brain on 05/30/2018 shows 20 subcentimeter enhancing brain lesions consistent with metastasis, no edema. - Left lower lobe lung biopsy on 06/18/2018 consistent with  adenocarcinoma.  There was not enough tissue to run molecular testing. - Biopsy of the right supraclavicular lymph node on 07/11/2018 was consistent with metastatic adenocarcinoma. -Osimertinib 80 mg daily was started on 07/28/2018. -She denies any skin rashes or diarrhea.  Denies any severe tiredness.  She is continuing to do all her daily activities.   -She is continue potassium twice daily.  I have reviewed her blood work.  Potassium is normal. -I will see her back in 2 weeks for follow-up.  I plan to repeat CT scans CAP and MRI of the brain prior to next visit.   2.  Recurrent pulmonary embolism: -This is likely secondary to active malignancy.  CT PE protocol on 04/24/2018 shows PE in the right lower lobe pulmonary artery.  She was started on Eliquis. -CT of the chest with contrast on 07/10/2018 shows bilateral pulmonary emboli, on the left lung new since diagnosis.  -She will continue Lovenox injection daily.  3.  Weight loss: -She is taking in 3 to 4 cans of Osmolite daily.  She is also drinking about 3 cans of Ensure.  She is eating about 1 small meal. -She does not report any good appetite. -I have reviewed her weight.  She gained about 2 to 3 pounds from last visit.         Total time spent is 25 minutes with more than 50% of the time spent face-to-face discussing treatment plan and coordination of care.     Orders placed this encounter:  Orders Placed This Encounter  Procedures  . CT Abdomen Pelvis W Contrast  . CT Chest W Contrast  . MR Brain W Contrast  . CBC with Differential/Platelet  . Comprehensive metabolic panel  . Magnesium      Derek Jack, MD [place] [phone]

## 2018-10-16 NOTE — Patient Instructions (Signed)
[place] at [facility] Discharge Instructions  You were seen today by Dr. Delton Coombes. He went over your recent lab results. He will see you back in 2 weeks  for labs, scan and follow up.   Thank you for choosing [place] at [facility] to provide your oncology and hematology care.  To afford each patient quality time with our provider, please arrive at least 15 minutes before your scheduled appointment time.   If you have a lab appointment with the [facility] please come in thru the  Main Entrance and check in at the main information desk  You need to re-schedule your appointment should you arrive 10 or more minutes late.  We strive to give you quality time with our providers, and arriving late affects you and other patients whose appointments are after yours.  Also, if you no show three or more times for appointments you may be dismissed from the clinic at the providers discretion.     Again, thank you for choosing [facility].  Our hope is that these requests will decrease the amount of time that you wait before being seen by our physicians.       _____________________________________________________________  Should you have questions after your visit to [facility], please contact our office at [phone] between the hours of 8:00 a.m. and 4:30 p.m.  Voicemails left after 4:00 p.m. will not be returned until the following business day.  For prescription refill requests, have your pharmacy contact our office and allow 72 hours.    Cancer Center Support Programs:   > Cancer Support Group  2nd Tuesday of the month 1pm-2pm, Journey Room

## 2018-10-16 NOTE — Addendum Note (Signed)
Addended by: Derek Jack on: 10/16/2018 04:09 PM   Modules accepted: Orders

## 2018-10-18 ENCOUNTER — Other Ambulatory Visit (HOSPITAL_COMMUNITY): Payer: Self-pay | Admitting: Nurse Practitioner

## 2018-10-21 ENCOUNTER — Encounter (HOSPITAL_COMMUNITY): Payer: Self-pay | Admitting: Dietician

## 2018-10-21 NOTE — Progress Notes (Signed)
Nutrition Assessment  ASSESSMENT:  68 y/o female w/ PMHx HTN, DM2, Stage  IV adenocarcinoma of lung. Dx in Dec. with 12/18 PET findings consistent w/ metastatic disease. First seen at [facility] on 12/23. MRI brain 1/3 showed ~20 brain mets. Lung Biopsy 1/22 + for adenocarcinoma.  Begun oral chemo 3/2. S/P WBRT 3/3. D/t intractable nausea and resultant FTT, pt is s/p hospitalization 4/16-4/18 w/ PEG placement and initiation of TF.   Checking in again with patient regarding her TF, which was initiated 5 and a half weeks ago.   On calling today, pt reports she is more or less unchanged. She is still infusing ~4 cans a day, "when my stomach allows me".  She still is consuming ~4 Boost Original supplements a day (240 kcals, 10g Pro).   Orally, she still is struggling with poor appetite and lack of taste. On discussing this again with her today, it sounds now like the problem is more with her sense of taste. She says foods do not taste right anymore. She says she mostly eats fruits: strawberries, oranges, pears, cherries. She still is very much interested in any medication that could give her an appetite.   Other than anorexia/taste change, she does not report dealing with any symptoms or side effects. Her nausea is still at [place] and she has not had any vomiting. She is having regular bowel movements.    Pt was weighed at 162.2 lbs this past week at office visit. She was 159 lbs at time of PEG placement on 4/17, suggesting modest upward trend vs stability. Pt is down 23 lbs x3 months (12.4% bw) and ~36 lbs since she was diagnosed at the end of last November (=18.1% bw in ~6 months).   Wt Readings from Last 10 Encounters:  10/16/18 162 lb 3 oz (73.6 kg)  10/01/18 159 lb (72.1 kg)  09/17/18 163 lb (73.9 kg)  09/11/18 159 lb 13.3 oz (72.5 kg)  08/26/18 168 lb 12.8 oz (76.6 kg)  08/15/18 168 lb (76.2 kg)  08/06/18 169 lb 9.6 oz (76.9 kg)  08/05/18 174 lb (78.9 kg)  08/05/18 170 lb 4.8 oz (77.2 kg)  07/15/18  185 lb 3 oz (84 kg)   MEDICATIONS: Chemo: Tagrisso  Supportive medications: KCL, Compazine, ppi, ativan, zofran, reglan. ordered Ambien at last office visit.   LABS: Per 5/21: Albumin 3.5 (has slowly increased over past month) BUN/Creat: 20/.77- creat stable. BUN slightly up over last month - possibly due to drastic increase in protein intake.   Recent Labs  Lab 10/16/18 1337  NA 134*  K 3.8  CL 98  CO2 24  BUN 20  CREATININE 0.77  CALCIUM 9.5  MG 2.1  GLUCOSE 120*  ' ANTHROPOMETRICS: Height:  Ht Readings from Last 1 Encounters:  09/11/18 '5\' 6"'$  (1.676 m)   Weight:  Wt Readings from Last 1 Encounters:  10/16/18 162 lb 3 oz (73.6 kg)   BMI:  BMI Readings from Last 1 Encounters:  10/16/18 26.18 kg/m   UBW: 213 lbs (per pt report) Wt changes  Stable at 159-163 x 1 month - 23 lbs (12.4% bw) x3 months (185 at end of Feb) -36 lbs (18.1% bw) since dx at end of Nov (198 at that time)  ESTIMATED ENERGY NEEDS:  Kcal:  2000-2200 kcals (27-30 kcal/kg bw) Protein:  96-110g Pro (1.3-1.5g/kg bw) Fluid:  2-2.2 L fluid (11m/kcal)  NUTRITION DIAGNOSIS:  Increased nutrient needs r/t cancer and cancer related treatments as evidenced by the nutrition recommendations for  this disease state.   DOCUMENTATION CODES:  Not applicable  INTERVENTION:  RD gave some simple recommendations related to dysgeusia/ageusia. It sounds that pt has found sweet-tasting foods are the ones she can best taste. RD recommended trying to add some sugar or salt to foods to intensify tastes. She says she is hesitant to do this because her BP and sugars are currently controlled and "I dont want to do anything to mess that up". RD recommended just to add a little and see its effect. Given her advanced cancer, RD advocated for any changes that would improve her nutritional QOL.   RD asked about mouth care. She says she does clean her mouth/teeth regularly. She again reports a sensation of having a "film" on her  teeth that will not go away.   Pt is stable. Patients total intake sounds adequate. Her supplemental TF regimen (4 cans Osmolite 1.5) is providing her: 1420 kcals, 60g Pro and 724 mls fluid (+~300 from flushes). She is receiving atleast another 720 kcals and 30g Pro from the 3-4 Boost Original supplements she drinks. She also receives a small additional amount of nutrition from the various bites/sips she takes during the day. Based on this, she is estimated to be meeting ~100% of kcal/pro needs. This assessment is supported by her weight stability.   RD had spoken w/ MD regarding patient poor appetite/lack of taste. Efficacy of marinol felt to be low. MD stated potential adjustments could be made to her chemo regimen depending on how she is responding thus far. She has scans scheduled prior to her next visit. Will follow outcome of scans/changes in treatment  Given her stability, RD will push f/u out to 2 weeks.   GOAL:  Pt will be able to tolerate 5 cans of TF/day OR will demonstrate sufficient oral intake to meet >90% of needs when combined with tube feeding -BELIEVED MET  MONITOR:  Chemotherapy tolerance, labs, weights, oral intake, TF tolerance    Next Visit: 2 weeks- 6/9  Burtis Junes RD, LDN, CNSC Clinical Nutrition Available Tues-Sat via Pager: [phone] 10/21/2018 8:50 AM

## 2018-10-22 DIAGNOSIS — R1310 Dysphagia, unspecified: Secondary | ICD-10-CM | POA: Diagnosis not present

## 2018-10-22 DIAGNOSIS — Z431 Encounter for attention to gastrostomy: Secondary | ICD-10-CM | POA: Diagnosis not present

## 2018-10-22 DIAGNOSIS — E119 Type 2 diabetes mellitus without complications: Secondary | ICD-10-CM | POA: Diagnosis not present

## 2018-10-22 DIAGNOSIS — C3490 Malignant neoplasm of unspecified part of unspecified bronchus or lung: Secondary | ICD-10-CM | POA: Diagnosis not present

## 2018-10-22 DIAGNOSIS — C7800 Secondary malignant neoplasm of unspecified lung: Secondary | ICD-10-CM | POA: Diagnosis not present

## 2018-10-22 DIAGNOSIS — E43 Unspecified severe protein-calorie malnutrition: Secondary | ICD-10-CM | POA: Diagnosis not present

## 2018-10-28 ENCOUNTER — Ambulatory Visit: Payer: Medicare HMO | Admitting: Pulmonary Disease

## 2018-10-29 ENCOUNTER — Telehealth (HOSPITAL_COMMUNITY): Payer: Self-pay | Admitting: *Deleted

## 2018-10-29 DIAGNOSIS — R1310 Dysphagia, unspecified: Secondary | ICD-10-CM | POA: Diagnosis not present

## 2018-10-29 DIAGNOSIS — Z431 Encounter for attention to gastrostomy: Secondary | ICD-10-CM | POA: Diagnosis not present

## 2018-10-29 DIAGNOSIS — C7800 Secondary malignant neoplasm of unspecified lung: Secondary | ICD-10-CM | POA: Diagnosis not present

## 2018-10-29 DIAGNOSIS — C3490 Malignant neoplasm of unspecified part of unspecified bronchus or lung: Secondary | ICD-10-CM | POA: Diagnosis not present

## 2018-10-29 DIAGNOSIS — E43 Unspecified severe protein-calorie malnutrition: Secondary | ICD-10-CM | POA: Diagnosis not present

## 2018-10-29 DIAGNOSIS — E119 Type 2 diabetes mellitus without complications: Secondary | ICD-10-CM | POA: Diagnosis not present

## 2018-10-29 NOTE — Telephone Encounter (Signed)
The nurse Janett Billow from [facility] called in about this patient stating that patient has some nausea and one episode of vomiting during her tube feeding. The nurse stated she slowed down the feeding and let gravity do the work and educated the patient and family on doing the samething. After slowing the tube feeding down the patient had no c/o nausea or vomiting. The nurse stated she would call to the clinic if the patient has any more issues during tube feedings.

## 2018-11-01 ENCOUNTER — Ambulatory Visit (HOSPITAL_COMMUNITY)
Admission: RE | Admit: 2018-11-01 | Discharge: 2018-11-01 | Disposition: A | Discharge status: Home / Self Care | Payer: Medicare HMO | Source: Ambulatory Visit | Attending: Radiation Oncology | Admitting: Radiation Oncology

## 2018-11-01 ENCOUNTER — Other Ambulatory Visit: Payer: Self-pay

## 2018-11-01 DIAGNOSIS — C7949 Secondary malignant neoplasm of other parts of nervous system: Secondary | ICD-10-CM | POA: Insufficient documentation

## 2018-11-01 DIAGNOSIS — C7931 Secondary malignant neoplasm of brain: Secondary | ICD-10-CM | POA: Insufficient documentation

## 2018-11-01 DIAGNOSIS — R51 Headache: Secondary | ICD-10-CM | POA: Diagnosis not present

## 2018-11-01 MED ORDER — GADOBUTROL 1 MMOL/ML IV SOLN
7.0000 mL | Freq: Once | INTRAVENOUS | Status: AC | PRN
  Administered 2018-11-01: 7 mL via INTRAVENOUS

## 2018-11-03 ENCOUNTER — Other Ambulatory Visit: Payer: Self-pay | Admitting: Radiation Therapy

## 2018-11-03 ENCOUNTER — Other Ambulatory Visit: Payer: Self-pay

## 2018-11-03 ENCOUNTER — Ambulatory Visit (HOSPITAL_COMMUNITY)
Admission: RE | Admit: 2018-11-03 | Discharge: 2018-11-03 | Disposition: A | Discharge status: Home / Self Care | Payer: Medicare HMO | Source: Ambulatory Visit | Attending: Hematology | Admitting: Hematology

## 2018-11-03 ENCOUNTER — Inpatient Hospital Stay (HOSPITAL_COMMUNITY): Payer: Medicare HMO | Attending: Hematology

## 2018-11-03 DIAGNOSIS — R634 Abnormal weight loss: Secondary | ICD-10-CM | POA: Insufficient documentation

## 2018-11-03 DIAGNOSIS — C7931 Secondary malignant neoplasm of brain: Secondary | ICD-10-CM | POA: Insufficient documentation

## 2018-11-03 DIAGNOSIS — C3490 Malignant neoplasm of unspecified part of unspecified bronchus or lung: Secondary | ICD-10-CM | POA: Diagnosis not present

## 2018-11-03 DIAGNOSIS — Z7901 Long term (current) use of anticoagulants: Secondary | ICD-10-CM | POA: Insufficient documentation

## 2018-11-03 DIAGNOSIS — C3492 Malignant neoplasm of unspecified part of left bronchus or lung: Secondary | ICD-10-CM | POA: Diagnosis not present

## 2018-11-03 DIAGNOSIS — I10 Essential (primary) hypertension: Secondary | ICD-10-CM | POA: Diagnosis not present

## 2018-11-03 DIAGNOSIS — C3432 Malignant neoplasm of lower lobe, left bronchus or lung: Secondary | ICD-10-CM | POA: Insufficient documentation

## 2018-11-03 DIAGNOSIS — Z79899 Other long term (current) drug therapy: Secondary | ICD-10-CM | POA: Insufficient documentation

## 2018-11-03 DIAGNOSIS — I2699 Other pulmonary embolism without acute cor pulmonale: Secondary | ICD-10-CM | POA: Diagnosis not present

## 2018-11-03 LAB — CBC WITH DIFFERENTIAL/PLATELET
Abs Immature Granulocytes: 0.03 10*3/uL (ref 0.00–0.07)
Basophils Absolute: 0.1 10*3/uL (ref 0.0–0.1)
Basophils Relative: 1 %
Eosinophils Absolute: 0.3 10*3/uL (ref 0.0–0.5)
Eosinophils Relative: 4 %
HCT: 38.6 % (ref 36.0–46.0)
Hemoglobin: 12.1 g/dL (ref 12.0–15.0)
Immature Granulocytes: 0 %
Lymphocytes Relative: 13 %
Lymphs Abs: 0.9 10*3/uL (ref 0.7–4.0)
MCH: 26.2 pg (ref 26.0–34.0)
MCHC: 31.3 g/dL (ref 30.0–36.0)
MCV: 83.5 fL (ref 80.0–100.0)
Monocytes Absolute: 1.1 10*3/uL — ABNORMAL HIGH (ref 0.1–1.0)
Monocytes Relative: 14 %
Neutro Abs: 5 10*3/uL (ref 1.7–7.7)
Neutrophils Relative %: 68 %
Platelets: 387 10*3/uL (ref 150–400)
RBC: 4.62 MIL/uL (ref 3.87–5.11)
RDW: 15.9 % — ABNORMAL HIGH (ref 11.5–15.5)
WBC: 7.3 10*3/uL (ref 4.0–10.5)
nRBC: 0 % (ref 0.0–0.2)

## 2018-11-03 LAB — COMPREHENSIVE METABOLIC PANEL
ALT: 24 U/L (ref 0–44)
AST: 18 U/L (ref 15–41)
Albumin: 3.5 g/dL (ref 3.5–5.0)
Alkaline Phosphatase: 59 U/L (ref 38–126)
Anion gap: 14 (ref 5–15)
BUN: 17 mg/dL (ref 8–23)
CO2: 20 mmol/L — ABNORMAL LOW (ref 22–32)
Calcium: 9.8 mg/dL (ref 8.9–10.3)
Chloride: 99 mmol/L (ref 98–111)
Creatinine, Ser: 0.8 mg/dL (ref 0.44–1.00)
GFR calc Af Amer: >60 mL/min (ref >60)
GFR calc non Af Amer: >60 mL/min (ref >60)
Glucose, Bld: 107 mg/dL — ABNORMAL HIGH (ref 70–99)
Potassium: 4.5 mmol/L (ref 3.5–5.1)
Sodium: 133 mmol/L — ABNORMAL LOW (ref 135–145)
Total Bilirubin: 0.5 mg/dL (ref 0.3–1.2)
Total Protein: 7.7 g/dL (ref 6.5–8.1)

## 2018-11-03 LAB — MAGNESIUM: Magnesium: 2 mg/dL (ref 1.7–2.4)

## 2018-11-03 MED ORDER — IOHEXOL 300 MG/ML  SOLN
75.0000 mL | Freq: Once | INTRAMUSCULAR | Status: AC | PRN
  Administered 2018-11-03: 15:00:00 75 mL via INTRAVENOUS

## 2018-11-04 ENCOUNTER — Encounter (HOSPITAL_COMMUNITY): Payer: Self-pay | Admitting: Dietician

## 2018-11-04 NOTE — Progress Notes (Signed)
Nutrition Assessment  ASSESSMENT:  68 y/o female w/ PMHx HTN, DM2, Stage  IV adenocarcinoma of lung. Dx in Dec. with 12/18 PET findings consistent w/ metastatic disease. First seen at [facility] on 12/23. MRI brain 1/3 showed ~20 brain mets. Lung Biopsy 1/22 + for adenocarcinoma.  Begun oral chemo 3/2. S/P WBRT 3/3. D/t intractable nausea and resultant FTT, pt is s/p hospitalization 4/16-4/18 w/ PEG placement and initiation of TF.   Checking in again with patient regarding her TF, which was initiated 7 and a half weeks ago.   Interim hx:  pt had repeat brain imaging which showed "excellent response" to WBRT and repeat CT chest which did not show any residual lesions/masses. Additionally, noted telephone encounter 6/3 - pt had n/v x1 with tf administration- potentially due to excessive rate of infusion.   On calling today, pt reports her n/v was a one-time occurrence. She thinks it occurred because she had some juice just prior to administering TF. She is still infusing ~3-4 cans of osmolite a day, "it all just depends".  She still is consuming ~3-4 Boost Original supplements a day (240 kcals, 10g Pro). She notes this has gotten to be expensive for her and she is willing to retry the Ensure the cancer center provides  Orally, she still is struggling with poor appetite and lack of taste, which MD had felt was d/t her xrt and chemo treatments. He had discussed possibility of reducing chemo dosage pending scan results. She has had her scans done and they will review these at her next appt on Monday.   Other than anorexia/taste change, she does not report dealing with any symptoms or side effects. Her nausea is still at [place] and she has not had any vomiting. She is having regular bowel movements.    Pts last "official" weight was 162.2 lbs. She was 159 lbs at time of PEG placement on 4/17, suggesting modest upward trend vs stability. Pt is down 23 lbs x3 months (12.4% bw) and ~36 lbs since she was diagnosed at  the end of last November (=18.1% bw in ~6 months).   Wt Readings from Last 10 Encounters:  10/16/18 162 lb 3 oz (73.6 kg)  10/01/18 159 lb (72.1 kg)  09/17/18 163 lb (73.9 kg)  09/11/18 159 lb 13.3 oz (72.5 kg)  08/26/18 168 lb 12.8 oz (76.6 kg)  08/15/18 168 lb (76.2 kg)  08/06/18 169 lb 9.6 oz (76.9 kg)  08/05/18 174 lb (78.9 kg)  08/05/18 170 lb 4.8 oz (77.2 kg)  07/15/18 185 lb 3 oz (84 kg)   MEDICATIONS: Chemo: Tagrisso  Supportive medications: KCL, Compazine, ppi, ativan, zofran, reglan, ambien.   LABS: Per 6/8: Albumin 3.5 (has slowly increased over past 1.5 months) BUN/Creat: 17/.8- creat stable. BUN slightly up over last month - possibly due to drastic increase in protein intake.   Recent Labs  Lab 11/03/18 1408  NA 133*  K 4.5  CL 99  CO2 20*  BUN 17  CREATININE 0.80  CALCIUM 9.8  MG 2.0  GLUCOSE 107*  ' ANTHROPOMETRICS: Height:  Ht Readings from Last 1 Encounters:  09/11/18 '5\' 6"'$  (1.676 m)   Weight:  Wt Readings from Last 1 Encounters:  10/16/18 162 lb 3 oz (73.6 kg)   BMI:  BMI Readings from Last 1 Encounters:  10/16/18 26.18 kg/m   UBW: 213 lbs (per pt report) Wt changes  Stable at 159-163 x 1 month -23 lbs (12.4% bw) x3 months (185 at end of  Feb) -36 lbs (18.1% bw) since dx at end of Nov (198 at that time)  ESTIMATED ENERGY NEEDS:  Kcal:  2000-2200 kcals (27-30 kcal/kg bw) Protein:  96-110g Pro (1.3-1.5g/kg bw) Fluid:  2-2.2 L fluid (71m/kcal)  NUTRITION DIAGNOSIS:  Increased nutrient needs r/t cancer and cancer related treatments as evidenced by the nutrition recommendations for this disease state.   DOCUMENTATION CODES:  Not applicable  INTERVENTION:   Pt is stable. She has regularly been taking 3-4 cans of Osmolite 1.5 via PEG and 3-4 cans of Boost orally each day. Her wt has been stable.   RD stated he would leave a case of ensure and coupons for her to pick up at her next appt on 6/16. Will also leave pt a handout on loss of  taste/smell, since this is her only major complaint at this point.   Patients intake sounds adequate. Her supplemental TF regimen (3-4 cans Osmolite 1.5) is providing her: 1065-1420 kcals and 45-60g Pro. She is receiving atleast another 720 kcals and 30g Pro from the 3-4 Boost Original supplements she drinks. She also receives a small additional amount of nutrition from the various bites/sips she takes during the day. Based on this, she is estimated to be meeting ~100% of kcal/pro needs. This assessment is supported by her weight stability.   RD had spoken w/ MD regarding patient poor appetite/lack of taste. Efficacy of marinol felt to be low. MD stated potential adjustments could be made to her chemo regimen depending on how she is responding thus far. She has scans scheduled prior to her next visit. Will follow outcome of scans/changes in treatment  Pending confirmation of her wt stability 6/16,  RD will push f/u to 3 weeks.   GOAL:  Pt will be able to tolerate 5 cans of TF/day OR will demonstrate sufficient oral intake to meet >90% of needs when combined with tube feeding -BELIEVED MET  MONITOR:  Chemotherapy tolerance, labs, weights, oral intake, TF tolerance    Next Visit: 3 weeks - 6/30  NBurtis JunesRD, LDN, CNSC Clinical Nutrition Available Tues-Sat via Pager: [phone]/01/2019 8:50 AM

## 2018-11-06 ENCOUNTER — Ambulatory Visit (HOSPITAL_COMMUNITY): Payer: Medicare HMO

## 2018-11-06 DIAGNOSIS — E119 Type 2 diabetes mellitus without complications: Secondary | ICD-10-CM | POA: Diagnosis not present

## 2018-11-06 DIAGNOSIS — E43 Unspecified severe protein-calorie malnutrition: Secondary | ICD-10-CM | POA: Diagnosis not present

## 2018-11-06 DIAGNOSIS — Z431 Encounter for attention to gastrostomy: Secondary | ICD-10-CM | POA: Diagnosis not present

## 2018-11-06 DIAGNOSIS — C7800 Secondary malignant neoplasm of unspecified lung: Secondary | ICD-10-CM | POA: Diagnosis not present

## 2018-11-06 DIAGNOSIS — C3490 Malignant neoplasm of unspecified part of unspecified bronchus or lung: Secondary | ICD-10-CM | POA: Diagnosis not present

## 2018-11-06 DIAGNOSIS — R1310 Dysphagia, unspecified: Secondary | ICD-10-CM | POA: Diagnosis not present

## 2018-11-07 ENCOUNTER — Other Ambulatory Visit: Payer: Self-pay

## 2018-11-10 ENCOUNTER — Other Ambulatory Visit: Payer: Self-pay

## 2018-11-10 ENCOUNTER — Encounter (HOSPITAL_COMMUNITY): Payer: Self-pay | Admitting: Hematology

## 2018-11-10 ENCOUNTER — Inpatient Hospital Stay (HOSPITAL_BASED_OUTPATIENT_CLINIC_OR_DEPARTMENT_OTHER): Payer: Medicare HMO | Admitting: Hematology

## 2018-11-10 DIAGNOSIS — C7931 Secondary malignant neoplasm of brain: Secondary | ICD-10-CM

## 2018-11-10 DIAGNOSIS — I10 Essential (primary) hypertension: Secondary | ICD-10-CM | POA: Diagnosis not present

## 2018-11-10 DIAGNOSIS — Z7901 Long term (current) use of anticoagulants: Secondary | ICD-10-CM | POA: Diagnosis not present

## 2018-11-10 DIAGNOSIS — C3490 Malignant neoplasm of unspecified part of unspecified bronchus or lung: Secondary | ICD-10-CM | POA: Diagnosis not present

## 2018-11-10 DIAGNOSIS — Z79899 Other long term (current) drug therapy: Secondary | ICD-10-CM | POA: Diagnosis not present

## 2018-11-10 DIAGNOSIS — Z431 Encounter for attention to gastrostomy: Secondary | ICD-10-CM | POA: Diagnosis not present

## 2018-11-10 DIAGNOSIS — C3432 Malignant neoplasm of lower lobe, left bronchus or lung: Secondary | ICD-10-CM

## 2018-11-10 DIAGNOSIS — I2699 Other pulmonary embolism without acute cor pulmonale: Secondary | ICD-10-CM | POA: Diagnosis not present

## 2018-11-10 DIAGNOSIS — C3492 Malignant neoplasm of unspecified part of left bronchus or lung: Secondary | ICD-10-CM

## 2018-11-10 DIAGNOSIS — R1310 Dysphagia, unspecified: Secondary | ICD-10-CM | POA: Diagnosis not present

## 2018-11-10 DIAGNOSIS — C7800 Secondary malignant neoplasm of unspecified lung: Secondary | ICD-10-CM | POA: Diagnosis not present

## 2018-11-10 DIAGNOSIS — R634 Abnormal weight loss: Secondary | ICD-10-CM

## 2018-11-10 DIAGNOSIS — E43 Unspecified severe protein-calorie malnutrition: Secondary | ICD-10-CM | POA: Diagnosis not present

## 2018-11-10 DIAGNOSIS — E119 Type 2 diabetes mellitus without complications: Secondary | ICD-10-CM | POA: Diagnosis not present

## 2018-11-10 MED ORDER — OSIMERTINIB MESYLATE 40 MG PO TABS: 40.0000 mg | ORAL_TABLET | Freq: Every day | ORAL | 0 refills | Status: DC

## 2018-11-10 NOTE — Patient Instructions (Addendum)
[place] at [facility] Discharge Instructions  You were seen today by Dr. Delton Coombes. He went over your recent lab results. He wants you to start taking Tagrisso 40 mg which is half the dose that you are on now.  Stop taking the 80 mg pills and we will send you a new bottle.  We will see you back in 3 weeks for labs and follow up.   Thank you for choosing [place] at [facility] to provide your oncology and hematology care.  To afford each patient quality time with our provider, please arrive at least 15 minutes before your scheduled appointment time.   If you have a lab appointment with the [facility] please come in thru the  Main Entrance and check in at the main information desk  You need to re-schedule your appointment should you arrive 10 or more minutes late.  We strive to give you quality time with our providers, and arriving late affects you and other patients whose appointments are after yours.  Also, if you no show three or more times for appointments you may be dismissed from the clinic at the providers discretion.     Again, thank you for choosing [facility].  Our hope is that these requests will decrease the amount of time that you wait before being seen by our physicians.       _____________________________________________________________  Should you have questions after your visit to [facility], please contact our office at [phone] between the hours of 8:00 a.m. and 4:30 p.m.  Voicemails left after 4:00 p.m. will not be returned until the following business day.  For prescription refill requests, have your pharmacy contact our office and allow 72 hours.    Cancer Center Support Programs:   > Cancer Support Group  2nd Tuesday of the month 1pm-2pm, Journey Room

## 2018-11-10 NOTE — Assessment & Plan Note (Addendum)
1.  Metastatic adenocarcinoma of the left lung with multiple brain mets: -Guardant 360 showing EGFR exon 19 deletion (EGFR [id number]) - Presentation to ER on 04/24/2018 with shortness of breath.  She was never smoker. -CT PE protocol showed large pulmonary embolus in the descending interlobar pulmonary artery with occlusion of the right lower lobe pulmonary artery and additional small clots noted in the right middle lobe and right upper lobes with associated right pleural effusion and right lower lobe infarct.  It also showed incidental left lower lobe mass measuring 3.8 x 3.7 x 2.1 cm associated with hilar and mediastinal adenopathy. - PET/CT scan on 05/14/2018 shows hypermetabolic left lower lobe lung mass measuring 3.7 cm, SUV of 11.  Bilateral hilar adenopathy and subcarinal adenopathy.  Both adrenal glands appear thickened and irregular with increased SUV.  Right lower lobe airspace disease has hypermetabolic rim and a hypermetabolic nodule medial to the main airspace disease.  This may represent evolving infection/inflammation. - MRI of the brain on 05/30/2018 shows 20 subcentimeter enhancing brain lesions consistent with metastasis, no edema. - Left lower lobe lung biopsy on 06/18/2018 consistent with adenocarcinoma.  There was not enough tissue to run molecular testing. - Biopsy of the right supraclavicular lymph node on 07/11/2018 was consistent with metastatic adenocarcinoma. -Osimertinib 80 mg daily was started on 07/28/2018. - We reviewed results of the MRI of the brain with and without contrast dated 11/01/2018 which showed excellent response with only faint enhancement of a few residual subcentimeter left hemisphere lesions. -Reviewed results of the CT of the chest with contrast dated 11/03/2018 which shows no evidence of adenopathy.  There is ill-defined soft tissue in the paratracheal fat without discrete measurable lesion.  Visualized portions of the liver, adrenal glands and right kidney are  unremarkable.  Airspace opacification in the medial aspects of both hemithoraces, and the left lower lobe as well. -She has lost about 3 pounds from last visit.  Her energy levels are 25%.  I have recommended decrease in the dose of osimertinib to 40 mg.  We have sent a prescription. -I will see her back in 3 weeks for follow-up.  2.  Recurrent pulmonary embolism: -This is likely secondary to active malignancy.  CT PE protocol on 04/24/2018 shows PE in the right lower lobe pulmonary artery.  She was started on Eliquis. -CT of the chest with contrast on 07/10/2018 shows bilateral pulmonary emboli, on the left lung new since diagnosis.  -She will continue Lovenox injections daily.  3.  Weight loss: -She is taking in 3 to 4 cans of Osmolite daily.  She is also drinking about 3 to 4 cans of Ensure.  She lost about 3 pounds since last visit. -Only solid food she is eating is breakfast. -I will cut back on the dose of osimertinib and see if it helps.

## 2018-11-10 NOTE — Progress Notes (Signed)
Kaitlin [name], [place] [id number]   CLINIC:  Medical Oncology/Hematology  PCP:  Kaitlin [name], [address] [facility] [place] [address] [phone]   REASON FOR VISIT:  Follow-up for metastatic lung cancer.     INTERVAL HISTORY:  Kaitlin [name] 68 y.o. female returns for routine follow-up.She is here alone.  She is currently on Tagrisso and is tolerating well. She reports stable fatigue. Reports decreased appetite. She has some nausea but feels that it is when she gets her food through the PEG tube too fast. She reports that the food comes back up.  She reports not having any taste. She is drinking 3-4 ensures a day. She is doing 3 tube feedings a day. She also reports trouble staying asleep and would like something for sleep. Denies vomiting, diarrhea, or abdominal pain. She denies any CP, SOB, lightheadedness or dizziness. She is doing all of her ADL independently.    REVIEW OF SYSTEMS:  Review of Systems  Constitutional: Positive for fatigue.  HENT:  Negative.   Eyes: Negative.   Respiratory: Negative.   Cardiovascular: Negative.   Gastrointestinal: Negative.   Endocrine: Negative.   Genitourinary: Negative.    Musculoskeletal: Negative.   Skin: Negative.   Neurological: Negative.   Hematological: Negative.   Psychiatric/Behavioral: Negative.      PAST MEDICAL/SURGICAL HISTORY:  Past Medical History:  Diagnosis Date  . Asthma   . Cancer ([place]) 04/21/2018   Lung Cancer   . Carpal tunnel syndrome   . Cataract    left eye  . Diabetes mellitus without complication ([place])   . DVT (deep venous thrombosis) ([place])   . Hypertension   . Pneumonia 04/21/2018  . Pulmonary embolism ([place]) 03/2018   Past Surgical History:  Procedure Laterality Date  . ABDOMINAL HYSTERECTOMY    . biopsy of right breast    . ESOPHAGOGASTRODUODENOSCOPY (EGD) WITH PROPOFOL N/A 09/12/2018   Procedure: ESOPHAGOGASTRODUODENOSCOPY (EGD) WITH PROPOFOL (scope in  1017, scope out 1032);  Surgeon: Virl Cagey, MD;  Location: [facility];  Service: General;  Laterality: N/A;  . FOOT SURGERY    . implant left eye    . INTRAOCULAR LENS INSERTION    . PEG PLACEMENT N/A 09/12/2018   Procedure: PERCUTANEOUS ENDOSCOPIC GASTROSTOMY (PEG) PLACEMENT;  Surgeon: Virl Cagey, MD;  Location: [facility];  Service: General;  Laterality: N/A;     SOCIAL HISTORY:  Social History   Socioeconomic History  . Marital status: Widowed    Spouse name: Not on file  . Number of children: 4  . Years of education: 17  . Highest education level: High school graduate  Occupational History  . Not on file  Social Needs  . Financial resource strain: Very hard  . Food insecurity    Worry: Sometimes true    Inability: Sometimes true  . Transportation needs    Medical: No    Non-medical: No  Tobacco Use  . Smoking status: Never Smoker  . Smokeless tobacco: Never Used  Substance and Sexual Activity  . Alcohol use: Not Currently    Comment: occ  . Drug use: No  . Sexual activity: Yes    Partners: Male    Birth control/protection: Surgical  Lifestyle  . Physical activity    Days per week: 0 days    Minutes per session: Not on file  . Stress: Only a little  Relationships  . Social connections    Talks on phone: More than  three times a week    Gets together: More than three times a week    Attends religious service: More than 4 times per year    Active member of club or organization: Yes    Attends meetings of clubs or organizations: More than 4 times per year    Relationship status: Widowed  . Intimate partner violence    Fear of current or ex partner: No    Emotionally abused: No    Physically abused: No    Forced sexual activity: No  Other Topics Concern  . 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    FAMILY HISTORY:  Family History  Problem Relation Age of Onset  . Heart failure Mother   . Stroke Mother   . Heart attack Mother   . Kidney failure Other   . Leukemia Father     CURRENT MEDICATIONS:  Outpatient Encounter Medications as of 11/10/2018  Medication Sig  . acetaminophen (TYLENOL) 325 MG tablet Take 2 tablets (650 mg total) by mouth every 6 (six) hours as needed for mild pain (or Fever >/= 101).  Marland Kitchen albuterol (PROVENTIL HFA;VENTOLIN HFA) 108 (90 BASE) MCG/ACT inhaler Inhale 2 puffs into the lungs every 6 (six) hours as needed for wheezing.  Marland Kitchen albuterol (PROVENTIL) (2.5 MG/3ML) 0.083% nebulizer solution Take 3 mLs (2.5 mg total) by nebulization every 6 (six) hours as needed for wheezing or shortness of breath.  Marland Kitchen amLODipine (NORVASC) 5 MG tablet Take 5 mg by mouth daily.  . Blood Glucose Monitoring Suppl (TRUE METRIX METER) DEVI 1 Device by Does not apply route daily.  . colchicine 0.6 MG tablet Take 1 tablet (0.6 mg total) by mouth daily.  Marland Kitchen enoxaparin (LOVENOX) 150 MG/ML injection Inject 150 mg into the skin daily.   Marland Kitchen LORazepam (ATIVAN) 0.5 MG tablet Take 0.5 mg by mouth at bedtime as needed for anxiety.  . metoCLOPramide (REGLAN) 10 MG tablet Take 1 tablet (10 mg total) by mouth every 8 (eight) hours as needed for refractory nausea / vomiting. (Patient taking differently: Take 10 mg by mouth daily. )  . Nutritional Supplements (FEEDING SUPPLEMENT, OSMOLITE 1.5 CAL,) LIQD Place 240 mLs into feeding tube 5 (five) times daily.  . Nutritional Supplements (PROMOD) LIQD 4ms via PEG BID.  .Marland Kitchenondansetron (ZOFRAN ODT) 4 MG disintegrating tablet Take 1 tablet (4 mg total) by mouth every 8 (eight) hours as needed for nausea or vomiting.  . pantoprazole (PROTONIX) 40 MG tablet TAKE 1 TABLET BY MOUTH ONCE DAILY.  .Marland Kitchenpotassium chloride (K-DUR) 10 MEQ tablet Take 2 tablets (20 mEq total) by mouth 2 (two) times daily.  . potassium  chloride (K-DUR) 10 MEQ tablet Take 10 mEq by mouth 2 (two) times daily.   . prochlorperazine (COMPAZINE) 10 MG tablet TAKE (1) TABLET BY MOUTH EVERY SIX HOURS AS NEEDED FOR NAUSEA OR VOMITING.  . TRUEplus Lancets 30G MISC   . Water For Irrigation, Sterile (FREE WATER) SOLN Place 200 mLs into feeding tube every 6 (six) hours.  .Marland Kitchenzolpidem (AMBIEN CR) 6.25 MG CR tablet Take 1 tablet (6.25 mg total) by mouth at bedtime as needed for sleep.  . [DISCONTINUED] osimertinib mesylate (TAGRISSO) 80 MG tablet Take 1 tablet (80 mg total) by mouth  daily.  . osimertinib mesylate (TAGRISSO) 40 MG tablet Take 1 tablet (40 mg total) by mouth daily.  . [DISCONTINUED] osimertinib mesylate (TAGRISSO) 40 MG tablet Take 1 tablet (40 mg total) by mouth daily.   No facility-administered encounter medications on file as of 11/10/2018.     ALLERGIES:  Allergies  Allergen Reactions  . Banana Anaphylaxis  . Aspirin Other (See Comments)    G.I. Upset      PHYSICAL EXAM:  ECOG Performance status: 1  Vitals:   11/10/18 1545  BP: 113/81  Pulse: (!) 111  Resp: 16  Temp: 99.1 F (37.3 C)  SpO2: 100%   Filed Weights   11/10/18 1545  Weight: 159 lb (72.1 kg)    Physical Exam Vitals signs reviewed.  Constitutional:      Appearance: Normal appearance.  Cardiovascular:     Rate and Rhythm: Normal rate and regular rhythm.     Heart sounds: Normal heart sounds.  Pulmonary:     Effort: Pulmonary effort is normal.     Breath sounds: Normal breath sounds.  Abdominal:     General: There is no distension.     Palpations: Abdomen is soft. There is no mass.  Musculoskeletal:        General: No swelling.  Skin:    General: Skin is warm.  Neurological:     General: No focal deficit present.     Mental Status: She is alert and oriented to person, place, and time.  Psychiatric:        Mood and Affect: Mood normal.        Behavior: Behavior normal.      LABORATORY DATA:  I have reviewed the labs as  listed.  CBC    Component Value Date/Time   WBC 7.3 11/03/2018 1408   RBC 4.62 11/03/2018 1408   HGB 12.1 11/03/2018 1408   HCT 38.6 11/03/2018 1408   PLT 387 11/03/2018 1408   MCV 83.5 11/03/2018 1408   MCH 26.2 11/03/2018 1408   MCHC 31.3 11/03/2018 1408   RDW 15.9 (H) 11/03/2018 1408   LYMPHSABS 0.9 11/03/2018 1408   MONOABS 1.1 (H) 11/03/2018 1408   EOSABS 0.3 11/03/2018 1408   BASOSABS 0.1 11/03/2018 1408   CMP Latest Ref Rng & Units 11/03/2018 10/16/2018 10/01/2018  Glucose 70 - 99 mg/dL 107(H) 120(H) 178(H)  BUN 8 - 23 mg/dL '17 20 13  '$ Creatinine 0.44 - 1.00 mg/dL 0.80 0.77 0.85  Sodium 135 - 145 mmol/L 133(L) 134(L) 133(L)  Potassium 3.5 - 5.1 mmol/L 4.5 3.8 4.1  Chloride 98 - 111 mmol/L 99 98 94(L)  CO2 22 - 32 mmol/L 20(L) 24 27  Calcium 8.9 - 10.3 mg/dL 9.8 9.5 9.3  Total Protein 6.5 - 8.1 g/dL 7.7 7.9 7.2  Total Bilirubin 0.3 - 1.2 mg/dL 0.5 0.3 0.2(L)  Alkaline Phos 38 - 126 U/L 59 70 73  AST 15 - 41 U/L '18 23 24  '$ ALT 0 - 44 U/L '24 23 23       '$ DIAGNOSTIC IMAGING:  I have independently reviewed the scans and discussed with the patient.      ASSESSMENT & PLAN:   Metastatic lung cancer (metastasis from lung to other site), left ([place]) 1.  Metastatic adenocarcinoma of the left lung with multiple brain mets: -Guardant 360 showing EGFR exon 19 deletion (EGFR [id number]) - Presentation to ER on 04/24/2018 with shortness of breath.  She was never smoker. -CT PE protocol showed large pulmonary embolus in the  descending interlobar pulmonary artery with occlusion of the right lower lobe pulmonary artery and additional small clots noted in the right middle lobe and right upper lobes with associated right pleural effusion and right lower lobe infarct.  It also showed incidental left lower lobe mass measuring 3.8 x 3.7 x 2.1 cm associated with hilar and mediastinal adenopathy. - PET/CT scan on 05/14/2018 shows hypermetabolic left lower lobe lung mass measuring 3.7 cm, SUV  of 11.  Bilateral hilar adenopathy and subcarinal adenopathy.  Both adrenal glands appear thickened and irregular with increased SUV.  Right lower lobe airspace disease has hypermetabolic rim and a hypermetabolic nodule medial to the main airspace disease.  This may represent evolving infection/inflammation. - MRI of the brain on 05/30/2018 shows 20 subcentimeter enhancing brain lesions consistent with metastasis, no edema. - Left lower lobe lung biopsy on 06/18/2018 consistent with adenocarcinoma.  There was not enough tissue to run molecular testing. - Biopsy of the right supraclavicular lymph node on 07/11/2018 was consistent with metastatic adenocarcinoma. -Osimertinib 80 mg daily was started on 07/28/2018. - We reviewed results of the MRI of the brain with and without contrast dated 11/01/2018 which showed excellent response with only faint enhancement of a few residual subcentimeter left hemisphere lesions. -Reviewed results of the CT of the chest with contrast dated 11/03/2018 which shows no evidence of adenopathy.  There is ill-defined soft tissue in the paratracheal fat without discrete measurable lesion.  Visualized portions of the liver, adrenal glands and right kidney are unremarkable.  Airspace opacification in the medial aspects of both hemithoraces, and the left lower lobe as well. -She has lost about 3 pounds from last visit.  Her energy levels are 25%.  I have recommended decrease in the dose of osimertinib to 40 mg.  We have sent a prescription. -I will see her back in 3 weeks for follow-up.  2.  Recurrent pulmonary embolism: -This is likely secondary to active malignancy.  CT PE protocol on 04/24/2018 shows PE in the right lower lobe pulmonary artery.  She was started on Eliquis. -CT of the chest with contrast on 07/10/2018 shows bilateral pulmonary emboli, on the left lung new since diagnosis.  -She will continue Lovenox injections daily.  3.  Weight loss: -She is taking in 3 to 4 cans of  Osmolite daily.  She is also drinking about 3 to 4 cans of Ensure.  She lost about 3 pounds since last visit. -Only solid food she is eating is breakfast. -I will cut back on the dose of osimertinib and see if it helps.         Total time spent is 25 minutes with more than 50% of the time spent face-to-face discussing treatment plan and coordination of care.     Orders placed this encounter:  No orders of the defined types were placed in this encounter.     Derek Jack, MD [place] [phone]

## 2018-11-12 ENCOUNTER — Ambulatory Visit
Admission: RE | Admit: 2018-11-12 | Discharge: 2018-11-12 | Disposition: A | Discharge status: Home / Self Care | Payer: Medicare HMO | Source: Ambulatory Visit | Attending: Urology | Admitting: Urology

## 2018-11-12 ENCOUNTER — Other Ambulatory Visit: Payer: Self-pay

## 2018-11-12 DIAGNOSIS — C3492 Malignant neoplasm of unspecified part of left bronchus or lung: Secondary | ICD-10-CM

## 2018-11-12 DIAGNOSIS — C3432 Malignant neoplasm of lower lobe, left bronchus or lung: Secondary | ICD-10-CM | POA: Diagnosis not present

## 2018-11-12 DIAGNOSIS — C7931 Secondary malignant neoplasm of brain: Secondary | ICD-10-CM

## 2018-11-12 DIAGNOSIS — Z08 Encounter for follow-up examination after completed treatment for malignant neoplasm: Secondary | ICD-10-CM | POA: Diagnosis not present

## 2018-11-12 DIAGNOSIS — C771 Secondary and unspecified malignant neoplasm of intrathoracic lymph nodes: Secondary | ICD-10-CM | POA: Diagnosis not present

## 2018-11-12 NOTE — Progress Notes (Signed)
Radiation Oncology         [phone] ________________________________  Name: GRICELDA BADUA MRN: [id number]  Date: 11/12/2018  DOB: [birth year]  Post Treatment Note  CC: [name], MD  Doreatha Massed, MD  Diagnosis:   68 y.o. female with metastatic NSCLC of the left lower lobe lung with significant mediastinal/hilar lymph node involvement andat least20 subcentimeter brain metastases.  Interval Since Last Radiation:  3 months  07/16/2018 - 07/29/2018:   1. The left lung was treated to 30 Gy in 10 fractions of 3 Gy. 2. The whole brain was treated to 30 Gy in 10 fractions of 3 Gy.  Narrative:  I spoke with the patient to conduct her routine scheduled 3 month follow up visit to review results of her recent follow up MRI brain scan via telephone to spare the patient unnecessary potential exposure in the healthcare setting during the current COVID-19 pandemic.  She has recovered well from the effects of her recent radiotherapy and is currently without complaints.  Her brain MRI from 11/01/18 shows an excellent response to whole-brain XRT, with only faint enhancement of a few residual subcentimeter LEFT hemisphere lesions.                  On review of systems, the patient states that she is doing well overall.  She continues with decreased appetite and nausea +/- vomiting after most meals despite using her Compazine as directed.  She has a PEG tube in place and is drinking 3-4 Ensures per day along with 3 tube feedings/daily.  She still does not have much if any taste and no desire to eat.  She denies abdominal pain and reports resolution of dysphagia and throat irritation.  She denies chest pain, productive cough, hemoptysis or increased shortness of breath. She remains quite fatigued and weak but denies headaches, dizziness/imbalance, focal weakness, tremor, changes in visual or auditory acuity, tinnitus or seizure activity.  She has continued on targeted systemic therapy with Osimertinib  80 mg daily started on 07/28/2018 under the care and direction of Dr. Ellin Saba- last medical oncology visit was 11/10/18 with plans to continue her current treatment but at a reduced dose to 40 mg to see if this helps with her systemic complaints.  Her most recent systemic imaging shows evolutionary changes of radiation therapy in the medial aspects of both hemi thoraces and left lower lobe but no evidence of adenopathy.  There is ill-defined soft tissue in the paratracheal fat without discrete measurable lesion.  Visualized portions of the liver, adrenal glands and right kidney are unremarkable.   ALLERGIES:  is allergic to banana and aspirin.  Meds: Current Outpatient Medications  Medication Sig Dispense Refill   acetaminophen (TYLENOL) 325 MG tablet Take 2 tablets (650 mg total) by mouth every 6 (six) hours as needed for mild pain (or Fever >/= 101). 40 tablet 1   albuterol (PROVENTIL HFA;VENTOLIN HFA) 108 (90 BASE) MCG/ACT inhaler Inhale 2 puffs into the lungs every 6 (six) hours as needed for wheezing.     albuterol (PROVENTIL) (2.5 MG/3ML) 0.083% nebulizer solution Take 3 mLs (2.5 mg total) by nebulization every 6 (six) hours as needed for wheezing or shortness of breath. 75 mL 2   amLODipine (NORVASC) 5 MG tablet Take 5 mg by mouth daily.     Blood Glucose Monitoring Suppl (TRUE METRIX METER) DEVI 1 Device by Does not apply route daily. 1 Device 0   colchicine 0.6 MG tablet Take 1 tablet (0.6  mg total) by mouth daily. 15 tablet 0   enoxaparin (LOVENOX) 150 MG/ML injection Inject 150 mg into the skin daily.      LORazepam (ATIVAN) 0.5 MG tablet Take 0.5 mg by mouth at bedtime as needed for anxiety.     metoCLOPramide (REGLAN) 10 MG tablet Take 1 tablet (10 mg total) by mouth every 8 (eight) hours as needed for refractory nausea / vomiting. (Patient taking differently: Take 10 mg by mouth daily. ) 30 tablet 0   Nutritional Supplements (FEEDING SUPPLEMENT, OSMOLITE 1.5 CAL,) LIQD Place  240 mLs into feeding tube 5 (five) times daily. 36000 mL 2   Nutritional Supplements (PROMOD) LIQD via PEG BID.     ondansetron (ZOFRAN ODT) 4 MG disintegrating tablet Take 1 tablet (4 mg total) by mouth every 8 (eight) hours as needed for nausea or vomiting. 30 tablet 2   osimertinib mesylate (TAGRISSO) 40 MG tablet Take 1 tablet (40 mg total) by mouth daily. 28 tablet 0   pantoprazole (PROTONIX) 40 MG tablet TAKE 1 TABLET BY MOUTH ONCE DAILY. 30 tablet 0   potassium chloride (K-DUR) 10 MEQ tablet Take 2 tablets (20 mEq total) by mouth 2 (two) times daily. 120 tablet 2   potassium chloride (K-DUR) 10 MEQ tablet Take 10 mEq by mouth 2 (two) times daily.      prochlorperazine (COMPAZINE) 10 MG tablet TAKE (1) TABLET BY MOUTH EVERY SIX HOURS AS NEEDED FOR NAUSEA OR VOMITING. 30 tablet 10   TRUEplus Lancets 30G MISC      Water For Irrigation, Sterile (FREE WATER) SOLN Place 200 mLs into feeding tube every 6 (six) hours.     zolpidem (AMBIEN CR) 6.25 MG CR tablet Take 1 tablet (6.25 mg total) by mouth at bedtime as needed for sleep. 30 tablet 0   No current facility-administered medications for this encounter.     Physical Findings: Unable to assess due to to telephone format used for this visit.  Lab Findings: Lab Results  Component Value Date   WBC 7.3 11/03/2018   HGB 12.1 11/03/2018   HCT 38.6 11/03/2018   MCV 83.5 11/03/2018   PLT 387 11/03/2018     Radiographic Findings: Ct Chest W Contrast  Result Date: 11/03/2018 CLINICAL DATA:  Stage IV lung cancer. EXAM: CT CHEST WITH CONTRAST TECHNIQUE: Multidetector CT imaging of the chest was performed during intravenous contrast administration. CONTRAST:  75mL OMNIPAQUE IOHEXOL 300 MG/ML  SOLN COMPARISON:  07/10/2018. FINDINGS: Cardiovascular: Pulmonic trunk is enlarged. Coronary artery calcification. Heart size normal. No pericardial effusion. Mediastinum/Nodes: There is ill-defined soft tissue in the paratracheal fat without  a discrete measurable lesion. No axillary adenopathy. Esophagus is grossly unremarkable. Lungs/Pleura: New parenchymal retraction, bronchiectasis and architectural distortion in the medial aspects of both hemi thoraces, presumably related to radiation therapy. Airspace opacification and ground-glass are seen in the left lower lobe as well. Trace bilateral pleural fluid. Airway is unremarkable. Upper Abdomen: Visualized portions of the liver, adrenal glands and right kidney are unremarkable. There may be scarring in the left kidney. Visualized portions of the spleen, pancreas, stomach and bowel are grossly unremarkable. Musculoskeletal: Degenerative changes in the spine. No worrisome lytic or sclerotic lesions. IMPRESSION: 1. Presumed evolutionary changes of radiation therapy in the medial aspects of both hemi thoraces and left lower lobe. Difficult to exclude left lower lobe pneumonia. 2. Residual ill-defined soft tissue in the paratracheal fat without discrete measurable lymph node. 3. Tiny bilateral pleural effusions. 4. Enlarged pulmonic trunk, indicative of pulmonary  arterial hypertension. Electronically Signed   By: Leanna Battles M.D.   On: 11/03/2018 15:25   Mr Laqueta Jean WU Contrast  Result Date: 11/02/2018 CLINICAL DATA:  Status post whole-brain XRT in March 2020. EXAM: MRI HEAD WITHOUT AND WITH CONTRAST TECHNIQUE: Multiplanar, multiecho pulse sequences of the brain and surrounding structures were obtained without and with intravenous contrast. CONTRAST:  Gadavist 7 mL. COMPARISON:  MR brain 05/30/2018 FINDINGS: The patient was unable to remain motionless for the exam. Small or subtle lesions could be overlooked. Brain: Excellent response to whole-brain XRT, with improvement or resolution of the multiple enhancing lesions throughout the brain. While exact delineation is hampered by patient motion, there is only faint enhancement seen in a few residual subcentimeter LEFT frontal and LEFT insular lesions as  seen on series 12 postcontrast. Atrophy with chronic microvascular ischemic change, stable. No acute or chronic hemorrhage. No significant vasogenic edema, or midline shift. Vascular: Flow voids are maintained. Skull and upper cervical spine: Normal marrow signal. Sinuses/Orbits: Negative. Other: None. IMPRESSION: Excellent response to whole-brain XRT, with only faint enhancement of a few residual subcentimeter LEFT hemisphere lesions. Electronically Signed   By: Elsie Stain M.D.   On: 11/02/2018 10:33    Impression/Plan:  This visit was conducted via telephone to spare the patient unnecessary potential exposure in the healthcare setting during the current COVID-19 pandemic.  1. 68 y.o. female with metastatic NSCLC of the left lower lobe lung with significant mediastinal/hilar lymph node involvement andat least20 subcentimeter brain metastases. She has recovered well from the effects of her recent radiotherapy. We reviewed her most recent brain MRI results from 11/01/18 which shows an excellent response to whole-brain XRT, with only faint enhancement of a few residual subcentimeter LEFT hemisphere lesions.  This was reviewed at the recent multidisciplinary brain tumor boards on 11/10/18 and the consensus recommendation is to continue with serial brain MRI every 3 months to monitor for any signs of disease recurrence or progression.  We will tentatively plan to see her back in the office following each scan to review findings and recommndations once the COVID-19 restrictions have been lifted and it is felt safe.  Otherwise, I will continue to call her for telephone follow up until that time.  She will also continue in routine follow up with Dr. Ellin Saba for management of her systemic disease treatment.  The current plan is to continue on targeted systemic therapy with Osimertinib at a reduced dose of 40 mg daily.  She appears to have a good understanding of our recommendations and is in agreement with the  stated plan. She knows to call at any time in the interim with questions or concerns related to her previous radiation.  Given current concerns for patient exposure during the COVID-19 pandemic, this encounter was conducted via telephone. The patient was notified in advance and was offered a WebEX meeting to allow for face to face communication but unfortunately reported that she did not have the appropriate resources/technology to support such a visit and instead preferred to proceed with telephone consult. The patient has given verbal consent for this type of encounter. The time spent during this encounter was 20 minutes and 50% of that time was spent in the coordination of her care. The attendants for this meeting include Rocco Kerkhoff PA-C, and patient, Monque Zaher. Buice. During the encounter, Hagar Sadiq PA-C was located at [facility] Radiation Oncology Department.  Patient, Cecillia Bieber. Singleton was located at home.    Brockton Mckesson  Celestia Khat, PA-C

## 2018-11-19 DIAGNOSIS — C3492 Malignant neoplasm of unspecified part of left bronchus or lung: Secondary | ICD-10-CM | POA: Diagnosis not present

## 2018-11-19 DIAGNOSIS — I2699 Other pulmonary embolism without acute cor pulmonale: Secondary | ICD-10-CM | POA: Diagnosis not present

## 2018-11-25 ENCOUNTER — Encounter (HOSPITAL_COMMUNITY): Payer: Self-pay | Admitting: Dietician

## 2018-11-25 ENCOUNTER — Other Ambulatory Visit (HOSPITAL_COMMUNITY): Payer: Self-pay | Admitting: *Deleted

## 2018-11-25 MED ORDER — OSIMERTINIB MESYLATE 40 MG PO TABS: 40.0000 mg | ORAL_TABLET | Freq: Every day | ORAL | 0 refills | Status: DC

## 2018-11-25 NOTE — Progress Notes (Signed)
Nutrition Follow up  ASSESSMENT:  68 y/o female w/ PMHx HTN, DM2, Stage  IV adenocarcinoma of lung. Dx in Dec. with 12/18 PET findings consistent w/ metastatic disease. First seen at [facility] on 12/23. MRI brain 1/3 showed ~20 brain mets. Lung Biopsy 1/22 + for adenocarcinoma.  Begun oral chemo 3/2. S/P WBRT 3/3. D/t intractable nausea and resultant FTT, pt is s/p hospitalization 4/16-4/18 w/ PEG placement and initiation of TF.   Following up w/ patient receiving home enteral nutrition.   Interim hx:  Office visit 6/15: Pt has had excellent response to WBXRT. Due to potential side effects (ageusia/dysgeusia), pts tagrisso dose was reduced on 6/15.   On calling today, RD asked if pt has any improvement since dosage decrease. Pt says she still has not gotten the new lower-dose prescription. Because she has not received the new prescription, she has continued with her previous dosage.   She is still infusing ~3-4 cans of osmolite a day, "it all just depends".  She still is consuming ~3-4 Boost Original supplements a day (240 kcals, 10g Pro).   Subjectively, pt reports no changes in how she feels since we last spoke. She still has no appetite and a poor/lack of taste, which still upsets her. She does report some intake of "actual food". She reports eating items such as squash and baked chicken, but says she only eats small amounts, "only enough to add a little extra to help me gain wt". She again states, "if they would just give me something to give me an appetite and energy".   She denies any further issues w/ n/v. She reports 3 loose stools a day and these have been aggravating her hemorrhoids. She shares she is trying to get in contact with Dr. Gala Romney about possible banding.    Pt was 159 lbs at last office visit 6/15. Unfortunately, the initiation of tube feeding supplementation has not led to a weight increase; she has been 159-163 lbs since Peg placed 4/17. Long term, . Pt is down 10 lbs x3 months  (5.9% bw) and ~39 lbs since she was diagnosed at the end of last November (=19.7% bw in 7 months).   Wt Readings from Last 10 Encounters:  11/10/18 159 lb (72.1 kg)  10/16/18 162 lb 3 oz (73.6 kg)  10/01/18 159 lb (72.1 kg)  09/17/18 163 lb (73.9 kg)  09/11/18 159 lb 13.3 oz (72.5 kg)  08/26/18 168 lb 12.8 oz (76.6 kg)  08/15/18 168 lb (76.2 kg)  08/06/18 169 lb 9.6 oz (76.9 kg)  08/05/18 174 lb (78.9 kg)  08/05/18 170 lb 4.8 oz (77.2 kg)   MEDICATIONS: Chemo: Tagrisso  Supportive medications: KCL, Compazine, ppi, ativan, zofran, reglan, ambien.   LABS:  None in past week.   ANTHROPOMETRICS: Height:  Ht Readings from Last 1 Encounters:  09/11/18 '5\' 6"'$  (1.676 m)   Weight:  Wt Readings from Last 1 Encounters:  11/10/18 159 lb (72.1 kg)   BMI:  BMI Readings from Last 1 Encounters:  11/10/18 25.66 kg/m   UBW: 213 lbs (per pt report) Wt changes  Stable at 159-163 x 2.5  months -10 lbs (5.9% bw) x3 months (168 at end of March) -39 lbs (19.7% bw) since dx at end of Nov (198 at that time)  ESTIMATED ENERGY NEEDS:  Kcal:  1950-2150 kcals (27-30 kcal/kg bw) Protein:  94-108g Pro (1.3-1.5g/kg bw) Fluid:  2-2.2 L fluid (85m/kcal)  NUTRITION DIAGNOSIS:  Increased nutrient needs r/t cancer and cancer related treatments  as evidenced by the nutrition recommendations for this disease state.   DOCUMENTATION CODES:  Not applicable  INTERVENTION:  RD reviewed how MD had felt the appetite stimulants would not have very good efficacy in this situation and he would try decreasing her chemo instead. Will have to wait and see how this dosage change helps. RD spoke with nurse navigator regarding pts prescription issues- nurse navigator reordered it. RD called pt back and reviewed what nurse navigator had instructed. She verbalized understanding.   RD asked how long she has had the issues with her loose bowels. She says this has been going on since she began treatment. On further  discussion, these sound to be TF related. RD reviewed how TF may cause looser stools, though they should not be watery. Regarding her hemorrhoids,  She says these also have gotten worse with treatment. RD asked if she felt she was straining and needed a laxative/softener. She said no- she thinks they have worsened "because of all the pills I am taking now".   Nutritionally, Pt is stable. Estimated intake: Her supplemental TF regimen (3-4 cans Osmolite 1.5) is providing her: 1065-1420 kcals and 45-60g Pro. She is receiving atleast another 720 kcals and 30g Pro from the 3-4 Boost Original supplements she drinks. She also receives a small additional amount of nutrition from the various bites/sips she takes during the day. Based on this, she is estimated to be meeting ~100% of kcal/pro needs. This assessment is supported by her weight stability.   Will f/u to assess impact of chemo change on intake in ~3 weeks.   GOAL:  Pt will be able to tolerate 5 cans of TF/day OR will demonstrate sufficient oral intake to meet >90% of needs when combined with tube feeding -BELIEVED MET  MONITOR:  Chemotherapy tolerance, labs, weights, oral intake, TF tolerance    Next Visit: 3 weeks - 7/21  Burtis Junes RD, LDN, CNSC Clinical Nutrition Available Tues-Sat via Pager: [phone] 11/25/2018 8:49 AM

## 2018-11-27 ENCOUNTER — Other Ambulatory Visit (HOSPITAL_COMMUNITY): Payer: Self-pay | Admitting: *Deleted

## 2018-11-27 DIAGNOSIS — C7931 Secondary malignant neoplasm of brain: Secondary | ICD-10-CM

## 2018-11-27 DIAGNOSIS — C3492 Malignant neoplasm of unspecified part of left bronchus or lung: Secondary | ICD-10-CM

## 2018-11-27 DIAGNOSIS — C3491 Malignant neoplasm of unspecified part of right bronchus or lung: Secondary | ICD-10-CM

## 2018-12-01 ENCOUNTER — Inpatient Hospital Stay (HOSPITAL_COMMUNITY): Payer: Medicare HMO

## 2018-12-01 ENCOUNTER — Encounter (HOSPITAL_COMMUNITY): Payer: Self-pay | Admitting: Hematology

## 2018-12-01 ENCOUNTER — Other Ambulatory Visit: Payer: Self-pay

## 2018-12-01 ENCOUNTER — Inpatient Hospital Stay (HOSPITAL_COMMUNITY): Payer: Medicare HMO | Attending: Hematology | Admitting: Hematology

## 2018-12-01 DIAGNOSIS — C3432 Malignant neoplasm of lower lobe, left bronchus or lung: Secondary | ICD-10-CM | POA: Insufficient documentation

## 2018-12-01 DIAGNOSIS — Z7901 Long term (current) use of anticoagulants: Secondary | ICD-10-CM | POA: Diagnosis not present

## 2018-12-01 DIAGNOSIS — C3492 Malignant neoplasm of unspecified part of left bronchus or lung: Secondary | ICD-10-CM

## 2018-12-01 DIAGNOSIS — R109 Unspecified abdominal pain: Secondary | ICD-10-CM | POA: Diagnosis not present

## 2018-12-01 DIAGNOSIS — I2699 Other pulmonary embolism without acute cor pulmonale: Secondary | ICD-10-CM | POA: Diagnosis not present

## 2018-12-01 DIAGNOSIS — F419 Anxiety disorder, unspecified: Secondary | ICD-10-CM | POA: Diagnosis present

## 2018-12-01 DIAGNOSIS — Z79899 Other long term (current) drug therapy: Secondary | ICD-10-CM | POA: Diagnosis not present

## 2018-12-01 DIAGNOSIS — J910 Malignant pleural effusion: Secondary | ICD-10-CM | POA: Diagnosis present

## 2018-12-01 DIAGNOSIS — E43 Unspecified severe protein-calorie malnutrition: Secondary | ICD-10-CM | POA: Diagnosis present

## 2018-12-01 DIAGNOSIS — C7931 Secondary malignant neoplasm of brain: Secondary | ICD-10-CM | POA: Diagnosis present

## 2018-12-01 DIAGNOSIS — Z1159 Encounter for screening for other viral diseases: Secondary | ICD-10-CM | POA: Diagnosis not present

## 2018-12-01 DIAGNOSIS — R634 Abnormal weight loss: Secondary | ICD-10-CM | POA: Diagnosis not present

## 2018-12-01 DIAGNOSIS — Z86718 Personal history of other venous thrombosis and embolism: Secondary | ICD-10-CM | POA: Diagnosis not present

## 2018-12-01 DIAGNOSIS — E119 Type 2 diabetes mellitus without complications: Secondary | ICD-10-CM | POA: Diagnosis not present

## 2018-12-01 DIAGNOSIS — Z66 Do not resuscitate: Secondary | ICD-10-CM | POA: Diagnosis present

## 2018-12-01 DIAGNOSIS — Z6825 Body mass index (BMI) 25.0-25.9, adult: Secondary | ICD-10-CM | POA: Diagnosis not present

## 2018-12-01 DIAGNOSIS — K9423 Gastrostomy malfunction: Secondary | ICD-10-CM | POA: Diagnosis present

## 2018-12-01 DIAGNOSIS — Z431 Encounter for attention to gastrostomy: Secondary | ICD-10-CM | POA: Diagnosis present

## 2018-12-01 DIAGNOSIS — Z886 Allergy status to analgesic agent status: Secondary | ICD-10-CM | POA: Diagnosis not present

## 2018-12-01 DIAGNOSIS — I10 Essential (primary) hypertension: Secondary | ICD-10-CM | POA: Insufficient documentation

## 2018-12-01 DIAGNOSIS — Y833 Surgical operation with formation of external stoma as the cause of abnormal reaction of the patient, or of later complication, without mention of misadventure at the time of the procedure: Secondary | ICD-10-CM | POA: Diagnosis present

## 2018-12-01 DIAGNOSIS — C3491 Malignant neoplasm of unspecified part of right bronchus or lung: Secondary | ICD-10-CM

## 2018-12-01 DIAGNOSIS — K219 Gastro-esophageal reflux disease without esophagitis: Secondary | ICD-10-CM | POA: Diagnosis present

## 2018-12-01 DIAGNOSIS — E11649 Type 2 diabetes mellitus with hypoglycemia without coma: Secondary | ICD-10-CM | POA: Diagnosis present

## 2018-12-01 DIAGNOSIS — M109 Gout, unspecified: Secondary | ICD-10-CM | POA: Diagnosis present

## 2018-12-01 DIAGNOSIS — Z03818 Encounter for observation for suspected exposure to other biological agents ruled out: Secondary | ICD-10-CM | POA: Diagnosis not present

## 2018-12-01 DIAGNOSIS — R1310 Dysphagia, unspecified: Secondary | ICD-10-CM | POA: Diagnosis present

## 2018-12-01 DIAGNOSIS — Z86711 Personal history of pulmonary embolism: Secondary | ICD-10-CM | POA: Diagnosis not present

## 2018-12-01 LAB — CBC WITH DIFFERENTIAL/PLATELET
Abs Immature Granulocytes: 0.03 10*3/uL (ref 0.00–0.07)
Basophils Absolute: 0 10*3/uL (ref 0.0–0.1)
Basophils Relative: 1 %
Eosinophils Absolute: 0.4 10*3/uL (ref 0.0–0.5)
Eosinophils Relative: 6 %
HCT: 42.6 % (ref 36.0–46.0)
Hemoglobin: 13.2 g/dL (ref 12.0–15.0)
Immature Granulocytes: 1 %
Lymphocytes Relative: 18 %
Lymphs Abs: 1.1 10*3/uL (ref 0.7–4.0)
MCH: 26.2 pg (ref 26.0–34.0)
MCHC: 31 g/dL (ref 30.0–36.0)
MCV: 84.7 fL (ref 80.0–100.0)
Monocytes Absolute: 0.9 10*3/uL (ref 0.1–1.0)
Monocytes Relative: 16 %
Neutro Abs: 3.4 10*3/uL (ref 1.7–7.7)
Neutrophils Relative %: 58 %
Platelets: 270 10*3/uL (ref 150–400)
RBC: 5.03 MIL/uL (ref 3.87–5.11)
RDW: 15.8 % — ABNORMAL HIGH (ref 11.5–15.5)
WBC: 5.8 10*3/uL (ref 4.0–10.5)
nRBC: 0 % (ref 0.0–0.2)

## 2018-12-01 LAB — COMPREHENSIVE METABOLIC PANEL
ALT: 41 U/L (ref 0–44)
AST: 25 U/L (ref 15–41)
Albumin: 3.7 g/dL (ref 3.5–5.0)
Alkaline Phosphatase: 64 U/L (ref 38–126)
Anion gap: 9 (ref 5–15)
BUN: 17 mg/dL (ref 8–23)
CO2: 25 mmol/L (ref 22–32)
Calcium: 9.9 mg/dL (ref 8.9–10.3)
Chloride: 104 mmol/L (ref 98–111)
Creatinine, Ser: 0.87 mg/dL (ref 0.44–1.00)
GFR calc Af Amer: >60 mL/min (ref >60)
GFR calc non Af Amer: >60 mL/min (ref >60)
Glucose, Bld: 99 mg/dL (ref 70–99)
Potassium: 4.1 mmol/L (ref 3.5–5.1)
Sodium: 138 mmol/L (ref 135–145)
Total Bilirubin: 0.5 mg/dL (ref 0.3–1.2)
Total Protein: 7.6 g/dL (ref 6.5–8.1)

## 2018-12-01 LAB — MAGNESIUM: Magnesium: 1.9 mg/dL (ref 1.7–2.4)

## 2018-12-01 NOTE — Assessment & Plan Note (Addendum)
1.  Metastatic adenocarcinoma of the left lung with multiple brain mets: -Guardant 360 showing EGFR exon 19 deletion (EGFR [id number]) - Presentation to ER on 04/24/2018 with shortness of breath.  She was never smoker. -CT PE protocol showed large pulmonary embolus in the descending interlobar pulmonary artery with occlusion of the right lower lobe pulmonary artery and additional small clots noted in the right middle lobe and right upper lobes with associated right pleural effusion and right lower lobe infarct.  It also showed incidental left lower lobe mass measuring 3.8 x 3.7 x 2.1 cm associated with hilar and mediastinal adenopathy. - PET/CT scan on 05/14/2018 shows hypermetabolic left lower lobe lung mass measuring 3.7 cm, SUV of 11.  Bilateral hilar adenopathy and subcarinal adenopathy.  Both adrenal glands appear thickened and irregular with increased SUV.  Right lower lobe airspace disease has hypermetabolic rim and a hypermetabolic nodule medial to the main airspace disease.  This may represent evolving infection/inflammation. - MRI of the brain on 05/30/2018 shows 20 subcentimeter enhancing brain lesions consistent with metastasis, no edema. - Left lower lobe lung biopsy on 06/18/2018 consistent with adenocarcinoma.  There was not enough tissue to run molecular testing. - Biopsy of the right supraclavicular lymph node on 07/11/2018 was consistent with metastatic adenocarcinoma. -Osimertinib 80 mg daily was started on 07/28/2018. - MRI of the brain on 11/01/2018 showed excellent response with only faint enhancement of a few residual subcentimeter left hemisphere lesions.  -CT of the chest on 11/03/2018 with no evidence of adenopathy.  There is ill-defined soft tissue in the para tracheal fat without discrete measurable lesion.  Visualized portions of the liver, adrenal glands and right kidney are unremarkable.  Airspace opacification in the medial aspect of the bilateral hemithoraces and the left lower  lobe as well.  - She lost about 1 pound since last visit.  She has not gotten supply of osimertinib 40 mg tablet. -At last visit I have decreased her osimertinib to 40 mg.  Her PEG tube was clogged since Saturday.  She is drinking about 3-4 ensures per day.  She is also taking in 3 to 4 cans of tube feeds daily. - She was told to start taking osimertinib 40 mg as soon as she gets the shipment.  Hopefully that will help with her weight loss. -She will be seen back in 3 weeks for follow-up.  2.  Recurrent pulmonary embolism: -This is likely secondary to active malignancy.  CT PE protocol on 04/24/2018 shows PE in the right lower lobe pulmonary artery.  She was started on Eliquis. -CT of the chest with contrast on 07/10/2018 shows bilateral pulmonary emboli, on the left lung new since diagnosis.  -She will continue Lovenox injections daily.  3.  Weight loss: -She is taking in 3 to 4 cans of Osmolite daily.  She is also drinking about 3 to 4 cans of Ensure. -Her weight is lower by 1 pound since last visit 3 weeks ago.  Her tube has clogged on Saturday.  We were able to unclog it today in the office. -We have cut back on the dose of osimertinib to 40 mg.  Hopefully this will help gain her weight back.

## 2018-12-01 NOTE — Progress Notes (Signed)
[name], [place] [address]   CLINIC:  Medical Oncology/Hematology  PCP:  [name], [address] [facility] [place] [address] [phone]   REASON FOR VISIT:  Follow-up for metastatic lung cancer.     INTERVAL HISTORY:  Kaitlin [name] 68 y.o. female seen for follow-up of her lung cancer.  He has not received lower dose of osimertinib shipment yet.  She is continuing to take 80 mg daily.  She reported pain in the right shoulder when she tries to use it.  She reportedly had rotator cuff problems from it.  Appetite is 50%.  Energy levels are 75%.  She reports that her feeding tube has stopped working Saturday as it was clogged up.  She is drinking about 3 to 4 cans of Ensure daily.  No fevers or night sweats.  No nausea or vomiting.  Food is not getting stuck after eating.  She is eating some solid foods.    REVIEW OF SYSTEMS:  Review of Systems  Constitutional: Negative for fatigue.  HENT:  Negative.   Eyes: Negative.   Respiratory: Negative.   Cardiovascular: Negative.   Gastrointestinal: Negative.   Endocrine: Negative.   Genitourinary: Negative.    Musculoskeletal: Negative.   Skin: Negative.   Neurological: Negative.   Hematological: Negative.   Psychiatric/Behavioral: Negative.   All other systems reviewed and are negative.    PAST MEDICAL/SURGICAL HISTORY:  Past Medical History:  Diagnosis Date  . Asthma   . Cancer ([facility]) 04/21/2018   Lung Cancer   . Carpal tunnel syndrome   . Cataract    left eye  . Diabetes mellitus without complication ([facility])   . DVT (deep venous thrombosis) ([facility])   . Hypertension   . Pneumonia 04/21/2018  . Pulmonary embolism ([facility]) 03/2018   Past Surgical History:  Procedure Laterality Date  . ABDOMINAL HYSTERECTOMY    . biopsy of right breast    . ESOPHAGOGASTRODUODENOSCOPY (EGD) WITH PROPOFOL N/A 09/12/2018   Procedure: ESOPHAGOGASTRODUODENOSCOPY (EGD) WITH PROPOFOL (scope in 1017, scope  out 1032);  Surgeon: Virl Cagey, MD;  Location: [facility];  Service: General;  Laterality: N/A;  . FOOT SURGERY    . implant left eye    . INTRAOCULAR LENS INSERTION    . PEG PLACEMENT N/A 09/12/2018   Procedure: PERCUTANEOUS ENDOSCOPIC GASTROSTOMY (PEG) PLACEMENT;  Surgeon: Virl Cagey, MD;  Location: [facility];  Service: General;  Laterality: N/A;     SOCIAL HISTORY:  Social History   Socioeconomic History  . Marital status: Widowed    Spouse name: Not on file  . Number of children: 4  . Years of education: 59  . Highest education level: High school graduate  Occupational History  . Not on file  Social Needs  . Financial resource strain: Very hard  . Food insecurity    Worry: Sometimes true    Inability: Sometimes true  . Transportation needs    Medical: No    Non-medical: No  Tobacco Use  . Smoking status: Never Smoker  . Smokeless tobacco: Never Used  Substance and Sexual Activity  . Alcohol use: Not Currently    Comment: occ  . Drug use: No  . Sexual activity: Yes    Partners: Male    Birth control/protection: Surgical  Lifestyle  . Physical activity    Days per week: 0 days    Minutes per session: Not on file  . Stress: Only a little  Relationships  .  Social connections    Talks on phone: More than three times a week    Gets together: More than three times a week    Attends religious service: More than 4 times per year    Active member of club or organization: Yes    Attends meetings of clubs or organizations: More than 4 times per year    Relationship status: Widowed  . Intimate partner violence    Fear of current or ex partner: No    Emotionally abused: No    Physically abused: No    Forced sexual activity: No  Other Topics Concern  . 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    FAMILY HISTORY:  Family History  Problem Relation Age of Onset  . Heart failure Mother   . Stroke Mother   . Heart attack Mother   . Kidney failure Other   . Leukemia Father     CURRENT MEDICATIONS:  Outpatient Encounter Medications as of 12/01/2018  Medication Sig  . acetaminophen (TYLENOL) 325 MG tablet Take 2 tablets (650 mg total) by mouth every 6 (six) hours as needed for mild pain (or Fever >/= 101).  Marland Kitchen albuterol (PROVENTIL HFA;VENTOLIN HFA) 108 (90 BASE) MCG/ACT inhaler Inhale 2 puffs into the lungs every 6 (six) hours as needed for wheezing.  Marland Kitchen albuterol (PROVENTIL) (2.5 MG/3ML) 0.083% nebulizer solution Take 3 mLs (2.5 mg total) by nebulization every 6 (six) hours as needed for wheezing or shortness of breath.  Marland Kitchen amLODipine (NORVASC) 5 MG tablet Take 5 mg by mouth daily.  . Blood Glucose Monitoring Suppl (TRUE METRIX METER) DEVI 1 Device by Does not apply route daily.  . colchicine 0.6 MG tablet Take 1 tablet (0.6 mg total) by mouth daily.  Marland Kitchen enoxaparin (LOVENOX) 150 MG/ML injection Inject 150 mg into the skin daily.   Marland Kitchen LORazepam (ATIVAN) 0.5 MG tablet Take 0.5 mg by mouth at bedtime as needed for anxiety.  . metoCLOPramide (REGLAN) 10 MG tablet Take 1 tablet (10 mg total) by mouth every 8 (eight) hours as needed for refractory nausea / vomiting. (Patient taking differently: Take 10 mg by mouth daily. )  . Nutritional Supplements (FEEDING SUPPLEMENT, OSMOLITE 1.5 CAL,) LIQD Place 240 mLs into feeding tube 5 (five) times daily.  . Nutritional Supplements (PROMOD) LIQD 31ms via PEG BID.  .Marland Kitchenondansetron (ZOFRAN ODT) 4 MG disintegrating tablet Take 1 tablet (4 mg total) by mouth every 8 (eight) hours as needed for nausea or vomiting.  .Marland Kitchenosimertinib mesylate (TAGRISSO) 40 MG tablet Take 1 tablet (40 mg total) by mouth daily.  . pantoprazole (PROTONIX) 40 MG tablet TAKE 1 TABLET BY MOUTH ONCE DAILY.  .Marland Kitchenpotassium chloride (K-DUR) 10 MEQ  tablet Take 2 tablets (20 mEq total) by mouth 2 (two) times daily.  . prochlorperazine (COMPAZINE) 10 MG tablet TAKE (1) TABLET BY MOUTH EVERY SIX HOURS AS NEEDED FOR NAUSEA OR VOMITING.  . TRUEplus Lancets 30G MISC   . Water For Irrigation, Sterile (FREE WATER) SOLN Place 200 mLs into feeding tube every 6 (six) hours.  .Marland Kitchenzolpidem (AMBIEN CR) 6.25 MG CR tablet Take 1 tablet (6.25 mg total) by mouth at bedtime as needed for sleep.  . [DISCONTINUED] potassium chloride (K-DUR) 10 MEQ  tablet Take 10 mEq by mouth 2 (two) times daily.    No facility-administered encounter medications on file as of 12/01/2018.     ALLERGIES:  Allergies  Allergen Reactions  . Banana Anaphylaxis  . Aspirin Other (See Comments)    G.I. Upset      PHYSICAL EXAM:  ECOG Performance status: 1  Vitals:   12/01/18 1500  BP: 110/73  Pulse: (!) 118  Resp: 18  Temp: (!) 97.5 F (36.4 C)  SpO2: 99%   Filed Weights   12/01/18 1500  Weight: 158 lb (71.7 kg)    Physical Exam Vitals signs reviewed.  Constitutional:      Appearance: Normal appearance.  Cardiovascular:     Rate and Rhythm: Normal rate and regular rhythm.     Heart sounds: Normal heart sounds.  Pulmonary:     Effort: Pulmonary effort is normal.     Breath sounds: Normal breath sounds.  Abdominal:     General: There is no distension.     Palpations: Abdomen is soft. There is no mass.  Musculoskeletal:        General: No swelling.  Skin:    General: Skin is warm.  Neurological:     General: No focal deficit present.     Mental Status: She is alert and oriented to person, place, and time.  Psychiatric:        Mood and Affect: Mood normal.        Behavior: Behavior normal.      LABORATORY DATA:  I have reviewed the labs as listed.  CBC    Component Value Date/Time   WBC 5.8 12/01/2018 1442   RBC 5.03 12/01/2018 1442   HGB 13.2 12/01/2018 1442   HCT 42.6 12/01/2018 1442   PLT 270 12/01/2018 1442   MCV 84.7 12/01/2018 1442    MCH 26.2 12/01/2018 1442   MCHC 31.0 12/01/2018 1442   RDW 15.8 (H) 12/01/2018 1442   LYMPHSABS 1.1 12/01/2018 1442   MONOABS 0.9 12/01/2018 1442   EOSABS 0.4 12/01/2018 1442   BASOSABS 0.0 12/01/2018 1442   CMP Latest Ref Rng & Units 12/01/2018 11/03/2018 10/16/2018  Glucose 70 - 99 mg/dL 99 107(H) 120(H)  BUN 8 - 23 mg/dL '17 17 20  '$ Creatinine 0.44 - 1.00 mg/dL 0.87 0.80 0.77  Sodium 135 - 145 mmol/L 138 133(L) 134(L)  Potassium 3.5 - 5.1 mmol/L 4.1 4.5 3.8  Chloride 98 - 111 mmol/L 104 99 98  CO2 22 - 32 mmol/L 25 20(L) 24  Calcium 8.9 - 10.3 mg/dL 9.9 9.8 9.5  Total Protein 6.5 - 8.1 g/dL 7.6 7.7 7.9  Total Bilirubin 0.3 - 1.2 mg/dL 0.5 0.5 0.3  Alkaline Phos 38 - 126 U/L 64 59 70  AST 15 - 41 U/L '25 18 23  '$ ALT 0 - 44 U/L 41 24 23       DIAGNOSTIC IMAGING:  I have independently reviewed the scans and discussed with the patient.      ASSESSMENT & PLAN:   Metastatic lung cancer (metastasis from lung to other site), left ([facility]) 1.  Metastatic adenocarcinoma of the left lung with multiple brain mets: -Guardant 360 showing EGFR exon 19 deletion (EGFR [id number]) - Presentation to ER on 04/24/2018 with shortness of breath.  She was never smoker. -CT PE protocol showed large pulmonary embolus in the descending interlobar pulmonary artery with occlusion of the right lower lobe pulmonary artery and additional small clots noted in the right middle lobe and right  upper lobes with associated right pleural effusion and right lower lobe infarct.  It also showed incidental left lower lobe mass measuring 3.8 x 3.7 x 2.1 cm associated with hilar and mediastinal adenopathy. - PET/CT scan on 05/14/2018 shows hypermetabolic left lower lobe lung mass measuring 3.7 cm, SUV of 11.  Bilateral hilar adenopathy and subcarinal adenopathy.  Both adrenal glands appear thickened and irregular with increased SUV.  Right lower lobe airspace disease has hypermetabolic rim and a hypermetabolic nodule medial to  the main airspace disease.  This may represent evolving infection/inflammation. - MRI of the brain on 05/30/2018 shows 20 subcentimeter enhancing brain lesions consistent with metastasis, no edema. - Left lower lobe lung biopsy on 06/18/2018 consistent with adenocarcinoma.  There was not enough tissue to run molecular testing. - Biopsy of the right supraclavicular lymph node on 07/11/2018 was consistent with metastatic adenocarcinoma. -Osimertinib 80 mg daily was started on 07/28/2018. - MRI of the brain on 11/01/2018 showed excellent response with only faint enhancement of a few residual subcentimeter left hemisphere lesions.  -CT of the chest on 11/03/2018 with no evidence of adenopathy.  There is ill-defined soft tissue in the para tracheal fat without discrete measurable lesion.  Visualized portions of the liver, adrenal glands and right kidney are unremarkable.  Airspace opacification in the medial aspect of the bilateral hemithoraces and the left lower lobe as well.  - She lost about 1 pound since last visit.  She has not gotten supply of osimertinib 40 mg tablet. -At last visit I have decreased her osimertinib to 40 mg.  Her PEG tube was clogged since Saturday.  She is drinking about 3-4 ensures per day.  She is also taking in 3 to 4 cans of tube feeds daily. - She was told to start taking osimertinib 40 mg as soon as she gets the shipment.  Hopefully that will help with her weight loss. -She will be seen back in 3 weeks for follow-up.  2.  Recurrent pulmonary embolism: -This is likely secondary to active malignancy.  CT PE protocol on 04/24/2018 shows PE in the right lower lobe pulmonary artery.  She was started on Eliquis. -CT of the chest with contrast on 07/10/2018 shows bilateral pulmonary emboli, on the left lung new since diagnosis.  -She will continue Lovenox injections daily.  3.  Weight loss: -She is taking in 3 to 4 cans of Osmolite daily.  She is also drinking about 3 to 4 cans of Ensure.  -Her weight is lower by 1 pound since last visit 3 weeks ago.  Her tube has clogged on Saturday.  We were able to unclog it today in the office. -We have cut back on the dose of osimertinib to 40 mg.  Hopefully this will help gain her weight back.        Total time spent is 25 minutes with more than 50% of the time spent face-to-face discussing treatment plan and coordination of care.     Orders placed this encounter:  No orders of the defined types were placed in this encounter.     Derek Jack, MD [place] [phone]

## 2018-12-01 NOTE — Patient Instructions (Addendum)
[facility] at [facility] Discharge Instructions  You were seen today by Dr. Katragadda. He went over your recent lab results. He will see you back in 3 weeks for labs and follow up.   Thank you for choosing [facility] at [facility] to provide your oncology and hematology care.  To afford each patient quality time with our provider, please arrive at least 15 minutes before your scheduled appointment time.   If you have a lab appointment with the Cancer Center please come in thru the  Main Entrance and check in at the main information desk  You need to re-schedule your appointment should you arrive 10 or more minutes late.  We strive to give you quality time with our providers, and arriving late affects you and other patients whose appointments are after yours.  Also, if you no show three or more times for appointments you may be dismissed from the clinic at the providers discretion.     Again, thank you for choosing [facility].  Our hope is that these requests will decrease the amount of time that you wait before being seen by our physicians.       _____________________________________________________________  Should you have questions after your visit to [facility], please contact our office at [phone] between the hours of 8:00 a.m. and 4:30 p.m.  Voicemails left after 4:00 p.m. will not be returned until the following business day.  For prescription refill requests, have your pharmacy contact our office and allow 72 hours.    Cancer Center Support Programs:   > Cancer Support Group  2nd Tuesday of the month 1pm-2pm, Journey Room    

## 2018-12-02 ENCOUNTER — Telehealth: Payer: Self-pay

## 2018-12-02 ENCOUNTER — Other Ambulatory Visit: Payer: Self-pay

## 2018-12-02 ENCOUNTER — Encounter (HOSPITAL_COMMUNITY): Payer: Self-pay | Admitting: Emergency Medicine

## 2018-12-02 ENCOUNTER — Inpatient Hospital Stay (HOSPITAL_COMMUNITY)
Admission: EM | Admit: 2018-12-02 | Discharge: 2018-12-05 | DRG: 393 | Disposition: A | Discharge status: Home / Self Care | Payer: Medicare HMO | Attending: Family Medicine | Admitting: Family Medicine

## 2018-12-02 ENCOUNTER — Emergency Department (HOSPITAL_COMMUNITY): Payer: Medicare HMO

## 2018-12-02 DIAGNOSIS — J910 Malignant pleural effusion: Secondary | ICD-10-CM | POA: Diagnosis present

## 2018-12-02 DIAGNOSIS — E43 Unspecified severe protein-calorie malnutrition: Secondary | ICD-10-CM | POA: Diagnosis present

## 2018-12-02 DIAGNOSIS — I10 Essential (primary) hypertension: Secondary | ICD-10-CM | POA: Diagnosis present

## 2018-12-02 DIAGNOSIS — E119 Type 2 diabetes mellitus without complications: Secondary | ICD-10-CM

## 2018-12-02 DIAGNOSIS — Y833 Surgical operation with formation of external stoma as the cause of abnormal reaction of the patient, or of later complication, without mention of misadventure at the time of the procedure: Secondary | ICD-10-CM | POA: Diagnosis present

## 2018-12-02 DIAGNOSIS — Z1159 Encounter for screening for other viral diseases: Secondary | ICD-10-CM

## 2018-12-02 DIAGNOSIS — E440 Moderate protein-calorie malnutrition: Secondary | ICD-10-CM | POA: Insufficient documentation

## 2018-12-02 DIAGNOSIS — Z66 Do not resuscitate: Secondary | ICD-10-CM | POA: Diagnosis present

## 2018-12-02 DIAGNOSIS — Z431 Encounter for attention to gastrostomy: Secondary | ICD-10-CM

## 2018-12-02 DIAGNOSIS — M109 Gout, unspecified: Secondary | ICD-10-CM | POA: Diagnosis present

## 2018-12-02 DIAGNOSIS — K219 Gastro-esophageal reflux disease without esophagitis: Secondary | ICD-10-CM | POA: Diagnosis present

## 2018-12-02 DIAGNOSIS — Z886 Allergy status to analgesic agent status: Secondary | ICD-10-CM

## 2018-12-02 DIAGNOSIS — R1310 Dysphagia, unspecified: Secondary | ICD-10-CM

## 2018-12-02 DIAGNOSIS — C7931 Secondary malignant neoplasm of brain: Secondary | ICD-10-CM | POA: Diagnosis present

## 2018-12-02 DIAGNOSIS — Z7901 Long term (current) use of anticoagulants: Secondary | ICD-10-CM

## 2018-12-02 DIAGNOSIS — Z86711 Personal history of pulmonary embolism: Secondary | ICD-10-CM

## 2018-12-02 DIAGNOSIS — K9423 Gastrostomy malfunction: Secondary | ICD-10-CM | POA: Diagnosis present

## 2018-12-02 DIAGNOSIS — E11649 Type 2 diabetes mellitus with hypoglycemia without coma: Secondary | ICD-10-CM | POA: Diagnosis present

## 2018-12-02 DIAGNOSIS — C3492 Malignant neoplasm of unspecified part of left bronchus or lung: Secondary | ICD-10-CM | POA: Diagnosis present

## 2018-12-02 DIAGNOSIS — Z6825 Body mass index (BMI) 25.0-25.9, adult: Secondary | ICD-10-CM

## 2018-12-02 DIAGNOSIS — Z86718 Personal history of other venous thrombosis and embolism: Secondary | ICD-10-CM

## 2018-12-02 DIAGNOSIS — F419 Anxiety disorder, unspecified: Secondary | ICD-10-CM | POA: Diagnosis present

## 2018-12-02 LAB — SARS CORONAVIRUS 2 BY RT PCR (HOSPITAL ORDER, PERFORMED IN ~~LOC~~ HOSPITAL LAB): SARS Coronavirus 2: NEGATIVE

## 2018-12-02 MED ORDER — ACETAMINOPHEN 325 MG PO TABS
650.0000 mg | ORAL_TABLET | Freq: Four times a day (QID) | ORAL | Status: DC | PRN
  Administered 2018-12-03 – 2018-12-04 (×4): 650 mg via ORAL
  Filled 2018-12-02 (×4): qty 2

## 2018-12-02 MED ORDER — IOHEXOL 300 MG/ML  SOLN
100.0000 mL | Freq: Once | INTRAMUSCULAR | Status: AC | PRN
  Administered 2018-12-02: 100 mL via INTRAVENOUS

## 2018-12-02 MED ORDER — MORPHINE SULFATE (PF) 4 MG/ML IV SOLN
4.0000 mg | Freq: Once | INTRAVENOUS | Status: AC
  Administered 2018-12-02: 4 mg via INTRAVENOUS
  Filled 2018-12-02: qty 1

## 2018-12-02 MED ORDER — HYDROCODONE-ACETAMINOPHEN 5-325 MG PO TABS
1.0000 | ORAL_TABLET | Freq: Once | ORAL | Status: AC
  Administered 2018-12-02: 1 via ORAL
  Filled 2018-12-02: qty 1

## 2018-12-02 MED ORDER — ACETAMINOPHEN 650 MG RE SUPP: 650.0000 mg | Freq: Four times a day (QID) | RECTAL | Status: DC | PRN

## 2018-12-02 MED ORDER — SODIUM CHLORIDE 0.9 % IV SOLN
INTRAVENOUS | Status: DC
  Administered 2018-12-02: 23:00:00 via INTRAVENOUS

## 2018-12-02 MED ORDER — INSULIN ASPART 100 UNIT/ML ~~LOC~~ SOLN
0.0000 [IU] | SUBCUTANEOUS | Status: DC
  Administered 2018-12-04: 1 [IU] via SUBCUTANEOUS

## 2018-12-02 NOTE — ED Triage Notes (Signed)
Peg tube clogged up since Saturday. Pt eats food by mouth.  She is partially tube fed.

## 2018-12-02 NOTE — H&P (Addendum)
TRH H&P    Patient Demographics:    Kaitlin [name], is a 68 y.o. female  MRN: [id number]  DOB - [birth year]  Admit Date - 12/02/2018  Referring MD/NP/PA:    Alveria Apley  Outpatient Primary MD for the patient is [name], MD Algis Greenhouse - surgery Doreatha Massed -  Oncology  Patient coming from:  home  Chief complaint-  Peg tube malfunction   HPI:    Kaitlin [name]  is a 68 y.o. female,w lung cancer (adenocarcinoma) with brain mets, hypertension, h/o DVT/ PE, hypokalemia, severe protein calorie malnutrition, dysphagia with PEG placement on 09/12/2018 presents with c/o PEG clogged up since Saturday.    In ED,  T 97  P 104  R 19  Bp 126/93  Pox 100% on RA  covid 19 pending  ED took out PEG and tried to insert foley but was unable.  Surgery consulted by ED who requested admission over nite  Pt will be admitted for PEG malfunction.     Review of systems:    In addition to the HPI above,  No Fever-chills, No Headache, No changes with Vision or hearing, No problems swallowing food or Liquids, No Chest pain, Cough or Shortness of Breath, No Abdominal pain, No Nausea or Vomiting, bowel movements are regular, No Blood in stool or Urine, No dysuria, No new skin rashes or bruises, No new joints pains-aches,  No new weakness, tingling, numbness in any extremity, No recent weight gain or loss, No polyuria, polydypsia or polyphagia, No significant Mental Stressors.  All other systems reviewed and are negative.    Past History of the following :    Past Medical History:  Diagnosis Date  . Asthma   . Cancer (HCC) 04/21/2018   Lung Cancer   . Carpal tunnel syndrome   . Cataract    left eye  . Diabetes mellitus without complication (HCC)   . DVT (deep venous thrombosis) (HCC)   . Hypertension   . Pneumonia 04/21/2018  . Pulmonary embolism (HCC) 03/2018      Past Surgical History:   Procedure Laterality Date  . ABDOMINAL HYSTERECTOMY    . biopsy of right breast    . ESOPHAGOGASTRODUODENOSCOPY (EGD) WITH PROPOFOL N/A 09/12/2018   Procedure: ESOPHAGOGASTRODUODENOSCOPY (EGD) WITH PROPOFOL (scope in 1017, scope out 1032);  Surgeon: Lucretia Roers, MD;  Location: [facility];  Service: General;  Laterality: N/A;  . FOOT SURGERY    . implant left eye    . INTRAOCULAR LENS INSERTION    . PEG PLACEMENT N/A 09/12/2018   Procedure: PERCUTANEOUS ENDOSCOPIC GASTROSTOMY (PEG) PLACEMENT;  Surgeon: Lucretia Roers, MD;  Location: [facility];  Service: General;  Laterality: N/A;      Social History:      Social History   Tobacco Use  . Smoking status: Never Smoker  . Smokeless tobacco: Never Used  Substance Use Topics  . Alcohol use: Not Currently    Comment: occ       Family History :     Family History  Problem Relation Age of Onset  . Heart failure Mother   . Stroke Mother   . Heart attack Mother   . Kidney failure Other   . Leukemia Father        Home Medications:   Prior to Admission medications   Medication Sig Start Date End Date Taking? Authorizing Provider  acetaminophen (TYLENOL) 325 MG tablet Take 2 tablets (650 mg total) by mouth every 6 (six) hours as needed for mild pain (or Fever >/= 101). 09/13/18   Vassie Loll, MD  albuterol (PROVENTIL HFA;VENTOLIN HFA) 108 (90 BASE) MCG/ACT inhaler Inhale 2 puffs into the lungs every 6 (six) hours as needed for wheezing.    [provider]  albuterol (PROVENTIL) (2.5 MG/3ML) 0.083% nebulizer solution Take 3 mLs (2.5 mg total) by nebulization every 6 (six) hours as needed for wheezing or shortness of breath. 04/29/18   Leatha Gilding, MD  amLODipine (NORVASC) 5 MG tablet Take 5 mg by mouth daily.    [provider]  Blood Glucose Monitoring Suppl (TRUE METRIX METER) DEVI 1 Device by Does not apply route daily. 04/29/18   Leatha Gilding, MD  colchicine 0.6 MG tablet Take 1 tablet (0.6 mg  total) by mouth daily. 09/09/18   Doreatha Massed, MD  enoxaparin (LOVENOX) 150 MG/ML injection Inject 150 mg into the skin daily.  08/11/18   [provider]  LORazepam (ATIVAN) 0.5 MG tablet Take 0.5 mg by mouth at bedtime as needed for anxiety.    [provider]  metoCLOPramide (REGLAN) 10 MG tablet Take 1 tablet (10 mg total) by mouth every 8 (eight) hours as needed for refractory nausea / vomiting. Patient taking differently: Take 10 mg by mouth daily.  09/11/18   Mesner, Barbara Cower, MD  Nutritional Supplements (FEEDING SUPPLEMENT, OSMOLITE 1.5 CAL,) LIQD Place 240 mLs into feeding tube 5 (five) times daily. 09/13/18 12/12/18  Vassie Loll, MD  Nutritional Supplements (PROMOD) LIQD via PEG BID. 09/16/18   Lockamy, Randi L, NP-C  ondansetron (ZOFRAN ODT) 4 MG disintegrating tablet Take 1 tablet (4 mg total) by mouth every 8 (eight) hours as needed for nausea or vomiting. 09/04/18   Doreatha Massed, MD  osimertinib mesylate (TAGRISSO) 40 MG tablet Take 1 tablet (40 mg total) by mouth daily. 11/25/18   Doreatha Massed, MD  pantoprazole (PROTONIX) 40 MG tablet TAKE 1 TABLET BY MOUTH ONCE DAILY. 10/21/18   Lockamy, Randi L, NP-C  potassium chloride (K-DUR) 10 MEQ tablet Take 2 tablets (20 mEq total) by mouth 2 (two) times daily. 09/09/18   Doreatha Massed, MD  prochlorperazine (COMPAZINE) 10 MG tablet TAKE (1) TABLET BY MOUTH EVERY SIX HOURS AS NEEDED FOR NAUSEA OR VOMITING. 10/09/18   Doree Barthel, NP-C  TRUEplus Lancets 30G MISC  07/19/18   [provider]  Water For Irrigation, Sterile (FREE WATER) SOLN Place 200 mLs into feeding tube every 6 (six) hours. 09/13/18   Vassie Loll, MD  zolpidem (AMBIEN CR) 6.25 MG CR tablet Take 1 tablet (6.25 mg total) by mouth at bedtime as needed for sleep. 10/16/18   Doreatha Massed, MD     Allergies:     Allergies  Allergen Reactions  . Banana Anaphylaxis  . Aspirin Other (See Comments)    G.IDorena Dew       Physical Exam:   Vitals  Blood pressure (!) 150/93, pulse 95, temperature (!) 97 F (36.1 C), temperature source Oral, resp. rate 20, height 5\' 6"  (1.676 m), weight 71.6 kg,  SpO2 98 %.  1.  General: axox3  2. Psychiatric: euthymic  3. Neurologic: Cn2-12 intact, reflexes 2+ symmetric, diffuse with no clonus, motor 5/5 in all 4 ext  4. HEENMT:  Anicteric, Pupils 1.61mm symmetric, direct , consensual, near intact  5. Respiratory : CTAB  6. Cardiovascular : rrr s1, s2,   7. Gastrointestinal:  Abd: soft, nt, nd, +bs  8. Skin:  Ext: no c/c/e,  No rash  9.Musculoskeletal:  Good ROM      Data Review:    CBC Recent Labs  Lab 12/01/18 1442  WBC 5.8  HGB 13.2  HCT 42.6  PLT 270  MCV 84.7  MCH 26.2  MCHC 31.0  RDW 15.8*  LYMPHSABS 1.1  MONOABS 0.9  EOSABS 0.4  BASOSABS 0.0   ------------------------------------------------------------------------------------------------------------------  Results for orders placed or performed in visit on 12/01/18 (from the past 48 hour(s))  Magnesium     Status: None   Collection Time: 12/01/18  2:42 PM  Result Value Ref Range   Magnesium 1.9 1.7 - 2.4 mg/dL    Comment: Performed at [facility], [address], [place], [place] [address]  CBC with Differential     Status: Abnormal   Collection Time: 12/01/18  2:42 PM  Result Value Ref Range   WBC 5.8 4.0 - 10.5 K/uL   RBC 5.03 3.87 - 5.11 MIL/uL   Hemoglobin 13.2 12.0 - 15.0 g/dL   HCT 91.4 78.2 - 95.6 %   MCV 84.7 80.0 - 100.0 fL   MCH 26.2 26.0 - 34.0 pg   MCHC 31.0 30.0 - 36.0 g/dL   RDW 21.3 (H) 08.6 - 57.8 %   Platelets 270 150 - 400 K/uL   nRBC 0.0 0.0 - 0.2 %   Neutrophils Relative % 58 %   Neutro Abs 3.4 1.7 - 7.7 K/uL   Lymphocytes Relative 18 %   Lymphs Abs 1.1 0.7 - 4.0 K/uL   Monocytes Relative 16 %   Monocytes Absolute 0.9 0.1 - 1.0 K/uL   Eosinophils Relative 6 %   Eosinophils Absolute 0.4 0.0 - 0.5 K/uL   Basophils Relative 1 %   Basophils  Absolute 0.0 0.0 - 0.1 K/uL   Immature Granulocytes 1 %   Abs Immature Granulocytes 0.03 0.00 - 0.07 K/uL    Comment: Performed at [facility], [address] [place], [place] [address]  Comprehensive metabolic panel     Status: None   Collection Time: 12/01/18  2:42 PM  Result Value Ref Range   Sodium 138 135 - 145 mmol/L   Potassium 4.1 3.5 - 5.1 mmol/L   Chloride 104 98 - 111 mmol/L   CO2 25 22 - 32 mmol/L   Glucose, Bld 99 70 - 99 mg/dL   BUN 17 8 - 23 mg/dL   Creatinine, Ser 9.52 0.44 - 1.00 mg/dL   Calcium 9.9 8.9 - 84.1 mg/dL   Total Protein 7.6 6.5 - 8.1 g/dL   Albumin 3.7 3.5 - 5.0 g/dL   AST 25 15 - 41 U/L   ALT 41 0 - 44 U/L   Alkaline Phosphatase 64 38 - 126 U/L   Total Bilirubin 0.5 0.3 - 1.2 mg/dL   GFR calc non Af Amer >60 >60 mL/min   GFR calc Af Amer >60 >60 mL/min   Anion gap 9 5 - 15    Comment: Performed at [facility], [address], [place], [place] [address]    Chemistries  Recent Labs  Lab 12/01/18  1442  NA 138  K 4.1  CL 104  CO2 25  GLUCOSE 99  BUN 17  CREATININE 0.87  CALCIUM 9.9  MG 1.9  AST 25  ALT 41  ALKPHOS 64  BILITOT 0.5   ------------------------------------------------------------------------------------------------------------------  ------------------------------------------------------------------------------------------------------------------ GFR: Estimated Creatinine Clearance: 62.7 mL/min (by C-G formula based on SCr of 0.87 mg/dL). Liver Function Tests: Recent Labs  Lab 12/01/18 1442  AST 25  ALT 41  ALKPHOS 64  BILITOT 0.5  PROT 7.6  ALBUMIN 3.7   No results for input(s): LIPASE, AMYLASE in the last 168 hours. No results for input(s): AMMONIA in the last 168 hours. Coagulation Profile: No results for input(s): INR, PROTIME in the last 168 hours. Cardiac Enzymes: No results for input(s): CKTOTAL, CKMB, CKMBINDEX, TROPONINI in the last 168 hours. BNP (last 3 results) No results for input(s): PROBNP in the  last 8760 hours. HbA1C: No results for input(s): HGBA1C in the last 72 hours. CBG: No results for input(s): GLUCAP in the last 168 hours. Lipid Profile: No results for input(s): CHOL, HDL, LDLCALC, TRIG, CHOLHDL, LDLDIRECT in the last 72 hours. Thyroid Function Tests: No results for input(s): TSH, T4TOTAL, FREET4, T3FREE, THYROIDAB in the last 72 hours. Anemia Panel: No results for input(s): VITAMINB12, FOLATE, FERRITIN, TIBC, IRON, RETICCTPCT in the last 72 hours.  --------------------------------------------------------------------------------------------------------------- Urine analysis:    Component Value Date/Time   COLORURINE YELLOW 07/06/2018 1555   APPEARANCEUR CLEAR 07/06/2018 1555   LABSPEC 1.012 07/06/2018 1555   PHURINE 6.0 07/06/2018 1555   GLUCOSEU NEGATIVE 07/06/2018 1555   HGBUR MODERATE (A) 07/06/2018 1555   BILIRUBINUR NEGATIVE 07/06/2018 1555   KETONESUR NEGATIVE 07/06/2018 1555   PROTEINUR NEGATIVE 07/06/2018 1555   UROBILINOGEN 0.2 04/11/2007 1936   NITRITE NEGATIVE 07/06/2018 1555   LEUKOCYTESUR MODERATE (A) 07/06/2018 1555      Imaging Results:    Ct Abdomen Pelvis W Contrast  Result Date: 12/02/2018 CLINICAL DATA:  Abdominal pain with a feeding tube. EXAM: CT ABDOMEN AND PELVIS WITH CONTRAST TECHNIQUE: Multidetector CT imaging of the abdomen and pelvis was performed using the standard protocol following bolus administration of intravenous contrast. CONTRAST:  OMNIPAQUE IOHEXOL 300 MG/ML  SOLN COMPARISON:  November 03, 2018 FINDINGS: Lower chest: There is extensive airspace consolidation involving the left lower lobe. There is a trace left-sided pleural effusion.The heart size is normal. There is a trace right-sided pleural effusion. There is likely a small pericardial effusion. Hepatobiliary: The liver is normal. Normal gallbladder.There is no biliary ductal dilation. Pancreas: Normal contours without ductal dilatation. No peripancreatic fluid collection.  Spleen: No splenic laceration or hematoma. Adrenals/Urinary Tract: --Adrenal glands: There is some mild thickening of the adrenal glands bilaterally. This is stable from prior study. --Right kidney/ureter: No hydronephrosis or perinephric hematoma. --Left kidney/ureter: No hydronephrosis or perinephric hematoma. --Urinary bladder: Unremarkable. Stomach/Bowel: --Stomach/Duodenum: There is a malposition PEG tube located within the patient's anterior left abdominal wall. There is some surrounding soft tissue swelling without evidence of an abscess. --Small bowel: No dilatation or inflammation. --Colon: Rectosigmoid diverticulosis without acute inflammation. --Appendix: Normal. Vascular/Lymphatic: Normal course and caliber of the major abdominal vessels. There is a circumaortic left renal vein, a normal variant. --No retroperitoneal lymphadenopathy. --No mesenteric lymphadenopathy. --No pelvic or inguinal lymphadenopathy. Reproductive: Status post hysterectomy. No adnexal mass. Other: No ascites or free air. The abdominal wall is normal. Small soft tissue nodules in the low anterior abdominal wall are favored to be secondary to subcutaneous injections at this location. Musculoskeletal. No acute displaced  fractures. IMPRESSION: 1. Malpositioned PEG tube, currently located within the anterior abdominal wall. Repositioning is recommended before use. 2. Airspace opacity in left lower lobe may represent treatment changes versus pneumonia. There are trace bilateral pleural effusions. 3. Sigmoid diverticulosis without CT evidence of diverticulitis. Electronically Signed   By: Katherine Mantle M.D.   On: 12/02/2018 21:12       Assessment & Plan:    Principal Problem:   PEG tube malfunction (HCC) Active Problems:   Dysphagia   Hypertension   Diabetes mellitus without complication (HCC)   Adenocarcinoma, lung, left (HCC)   History of pulmonary embolism   PEG malfunction NPO after [facility] ER unable to place foley  Surgery consult for AM , appreciate input  Dysphagia Severe protein calorie malnutrition Surgery consulted as above for PEG  GERD Cont PPI zofran 4mg  iv q6h prn   Chronic nausea Cont Reglan  H/o PE/ DVT Cont Lovenox  Hypertension Cont Amlodipine 5mg  po qday Hydralazine 5mg  iv q6h prn sbp >160  Adenocarcinoma of the left lung w brain mets F/u with oncology  H/o Gout Cont Colchicine   Anxiety/ Insomnia Lorazepam 0.5mg  po qhs Ambien qhs prn     DVT Prophylaxis-   Lovenox - SCDs    AM Labs Ordered, also please review Full Orders  Family Communication: Admission, patients condition and plan of care including tests being ordered have been discussed with the patient and Idelia Salm  who indicate understanding and agree with the plan and Code Status.  Code Status:  DNR,  Per pt request, notified Idelia Salm that she would be admitted overnite for PEG malfunction  Admission status: Observation : Based on patients clinical presentation and evaluation of above clinical data, I have made determination that patient meets observation criteria at this time.   Time spent in minutes :  55   Pearson Grippe M.D on 12/02/2018 at 10:58 PM

## 2018-12-02 NOTE — Progress Notes (Signed)
[facility]  Called by E. Patient with dislodged PEG. In the abdominal wall.  Placed 08/2018.   Reviewed CT- bumper in the abdominal wall, looks like still a track to stomach on the sagittal films. Stomach against the anterior abdominal wall as expected. No fluid or evidence of feeds going outside the stomach. No abscess or collection noted. Labs from yesterday with normal WBC.   ED going to remove PEG and attempt to replace with large foley catheter, would try as large as can get at first, 20 french if possible.  If able to replace, fill balloon with water, and do not use tube. Will need G tube study with radiology in the AM. Keep NPO also.  If unable to replace in ED, I will try to replace with foley in the AM. I am unable to replace at bedside, we can ask if IR can replace versus repeat PEG in the OR. Either way patient to stay overnight to get G tube study. Admit to hospitalist.   Curlene Labrum, MD [facility] [address] [place], [place] [address] [phone] (office)

## 2018-12-02 NOTE — ED Provider Notes (Signed)
[facility] EMERGENCY DEPARTMENT Provider Note   CSN: [id number] Arrival date & time: 12/02/18  1529    History   Chief Complaint No chief complaint on file.   HPI Kaitlin [name] is a 68 y.o. female presenting for evaluation of abdominal pain and issues with the PEG tube.  Patient states that the past 3 days, she has been having issues with her PEG tube.  Her feedings are not going and like normal.  She started to have severe pain yesterday, especially when having a feeding.  Pain is present in between feedings, although much more mild.  It was flushed at her doctor's office yesterday, which caused increased pain.  This morning she gave herself her feeding also with increased pain.  She denies fevers, chills, nausea, vomiting, chest pain, shortness of breath, abnormal urination, abnormal bowel movements.  PEG tube was placed in April 2020.  Additional history obtained from chart review.  PEG tube placed 09-12-2018 by Dr. Constance Haw.  History of asthma, lung cancer with mets to the brain, diabetes, DVT/PE on anticoagulation, hypertension.     HPI  Past Medical History:  Diagnosis Date   Asthma    Cancer ([place]) 04/21/2018   Lung Cancer    Carpal tunnel syndrome    Cataract    left eye   Diabetes mellitus without complication (HCC)    DVT (deep venous thrombosis) ([place])    Hypertension    Pneumonia 04/21/2018   Pulmonary embolism ([place]) 03/2018    Patient Active Problem List   Diagnosis Date Noted   Protein-calorie malnutrition, severe 09/12/2018   Metastatic lung cancer (metastasis from lung to other site), left [facility])    Nausea and vomiting 09/11/2018   Weight loss 09/11/2018   Hypokalemia 09/09/2018   Brain metastases ([place]) 07/12/2018   Malignant pericardial effusion ([place]) 07/08/2018   HCAP (healthcare-associated pneumonia) 07/06/2018   Adenocarcinoma of lung, right ([place]) 07/06/2018   Adenocarcinoma, lung, left ([place]) 07/03/2018   Asthma 06/16/2018    Hypertension 06/16/2018   Diabetes mellitus without complication ([place]) 48/54/6270   DVT (deep venous thrombosis) ([place]) 06/16/2018   Pulmonary embolism during treatment with long-term anticoagulation therapy ([place]) 06/16/2018   Dysphagia 05/05/2018   Sepsis ([place])    Lung mass    Pulmonary emboli ([place]) 04/24/2018   Routine screening for STI (sexually transmitted infection) 03/14/2016    Past Surgical History:  Procedure Laterality Date   ABDOMINAL HYSTERECTOMY     biopsy of right breast     ESOPHAGOGASTRODUODENOSCOPY (EGD) WITH PROPOFOL N/A 09/12/2018   Procedure: ESOPHAGOGASTRODUODENOSCOPY (EGD) WITH PROPOFOL (scope in 1017, scope out 1032);  Surgeon: Virl Cagey, MD;  Location: [facility];  Service: General;  Laterality: N/A;   FOOT SURGERY     implant left eye     INTRAOCULAR LENS INSERTION     PEG PLACEMENT N/A 09/12/2018   Procedure: PERCUTANEOUS ENDOSCOPIC GASTROSTOMY (PEG) PLACEMENT;  Surgeon: Virl Cagey, MD;  Location: [facility];  Service: General;  Laterality: N/A;     OB History    Gravida  4   Para  4   Term  4   Preterm      AB      Living        SAB      TAB      Ectopic      Multiple      Live Births               Home Medications  Prior to Admission medications   Medication Sig Start Date End Date Taking? Authorizing Provider  acetaminophen (TYLENOL) 325 MG tablet Take 2 tablets (650 mg total) by mouth every 6 (six) hours as needed for mild pain (or Fever >/= 101). 09/13/18   Barton Dubois, MD  albuterol (PROVENTIL HFA;VENTOLIN HFA) 108 (90 BASE) MCG/ACT inhaler Inhale 2 puffs into the lungs every 6 (six) hours as needed for wheezing.    [provider]  albuterol (PROVENTIL) (2.5 MG/3ML) 0.083% nebulizer solution Take 3 mLs (2.5 mg total) by nebulization every 6 (six) hours as needed for wheezing or shortness of breath. 04/29/18   Caren Griffins, MD  amLODipine (NORVASC) 5 MG tablet Take 5 mg by mouth daily.     [provider]  Blood Glucose Monitoring Suppl (TRUE METRIX METER) DEVI 1 Device by Does not apply route daily. 04/29/18   Caren Griffins, MD  colchicine 0.6 MG tablet Take 1 tablet (0.6 mg total) by mouth daily. 09/09/18   Derek Jack, MD  enoxaparin (LOVENOX) 150 MG/ML injection Inject 150 mg into the skin daily.  08/11/18   [provider]  LORazepam (ATIVAN) 0.5 MG tablet Take 0.5 mg by mouth at bedtime as needed for anxiety.    [provider]  metoCLOPramide (REGLAN) 10 MG tablet Take 1 tablet (10 mg total) by mouth every 8 (eight) hours as needed for refractory nausea / vomiting. Patient taking differently: Take 10 mg by mouth daily.  09/11/18   Mesner, Corene Cornea, MD  Nutritional Supplements (FEEDING SUPPLEMENT, OSMOLITE 1.5 CAL,) LIQD Place 240 mLs into feeding tube 5 (five) times daily. 09/13/18 12/12/18  Barton Dubois, MD  Nutritional Supplements (PROMOD) LIQD 17mls via PEG BID. 09/16/18   Lockamy, Randi L, NP-C  ondansetron (ZOFRAN ODT) 4 MG disintegrating tablet Take 1 tablet (4 mg total) by mouth every 8 (eight) hours as needed for nausea or vomiting. 09/04/18   Derek Jack, MD  osimertinib mesylate (TAGRISSO) 40 MG tablet Take 1 tablet (40 mg total) by mouth daily. 11/25/18   Derek Jack, MD  pantoprazole (PROTONIX) 40 MG tablet TAKE 1 TABLET BY MOUTH ONCE DAILY. 10/21/18   Lockamy, Randi L, NP-C  potassium chloride (K-DUR) 10 MEQ tablet Take 2 tablets (20 mEq total) by mouth 2 (two) times daily. 09/09/18   Derek Jack, MD  prochlorperazine (COMPAZINE) 10 MG tablet TAKE (1) TABLET BY MOUTH EVERY SIX HOURS AS NEEDED FOR NAUSEA OR VOMITING. 10/09/18   Glennie Isle, NP-C  TRUEplus Lancets 30G MISC  07/19/18   [provider]  Water For Irrigation, Sterile (FREE WATER) SOLN Place 200 mLs into feeding tube every 6 (six) hours. 09/13/18   Barton Dubois, MD  zolpidem (AMBIEN CR) 6.25 MG CR tablet Take 1 tablet (6.25 mg total) by  mouth at bedtime as needed for sleep. 10/16/18   Derek Jack, MD    Family History Family History  Problem Relation Age of Onset   Heart failure Mother    Stroke Mother    Heart attack Mother    Kidney failure Other    Leukemia Father     Social History Social History   Tobacco Use   Smoking status: Never Smoker   Smokeless tobacco: Never Used  Substance Use Topics   Alcohol use: Not Currently    Comment: occ   Drug use: No     Allergies   Banana and Aspirin   Review of Systems Review of Systems  Gastrointestinal: Positive for abdominal pain.  Allergic/Immunologic: Positive for immunocompromised state.  Hematological: Bruises/bleeds easily.  All other systems reviewed and are negative.    Physical Exam Updated Vital Signs BP (!) 149/87    Pulse 82    Temp (!) 97 F (36.1 C) (Oral)    Resp 20    Ht 5\' 6"  (1.676 m)    Wt 71.6 kg    SpO2 96%    BMI 25.47 kg/m   Physical Exam Vitals signs and nursing note reviewed.  Constitutional:      General: She is not in acute distress.    Appearance: She is well-developed.     Comments: Appears nontoxic  HENT:     Head: Normocephalic and atraumatic.  Eyes:     Conjunctiva/sclera: Conjunctivae normal.     Pupils: Pupils are equal, round, and reactive to light.  Neck:     Musculoskeletal: Normal range of motion and neck supple.  Cardiovascular:     Rate and Rhythm: Normal rate and regular rhythm.     Pulses: Normal pulses.  Pulmonary:     Effort: Pulmonary effort is normal. No respiratory distress.     Breath sounds: Normal breath sounds. No wheezing.  Abdominal:     General: There is no distension.     Palpations: Abdomen is soft. There is no mass.     Tenderness: There is abdominal tenderness. There is no guarding or rebound.     Comments: PEG tube in place.  No surrounding erythema or induration.  Tenderness to palpation of the abdomen in the general area surrounding the PEG tube.  No tenderness  palpation distal from the PEG tube.  No rigidity, guarding, distention.  Negative rebound.  No obvious signs of peritonitis.  Musculoskeletal: Normal range of motion.  Skin:    General: Skin is warm and dry.  Neurological:     Mental Status: She is alert and oriented to person, place, and time.      ED Treatments / Results  Labs (all labs ordered are listed, but only abnormal results are displayed) Labs Reviewed  SARS CORONAVIRUS 2 ([place], [place] [facility])    EKG None  Radiology Ct Abdomen Pelvis W Contrast  Result Date: 12/02/2018 CLINICAL DATA:  Abdominal pain with a feeding tube. EXAM: CT ABDOMEN AND PELVIS WITH CONTRAST TECHNIQUE: Multidetector CT imaging of the abdomen and pelvis was performed using the standard protocol following bolus administration of intravenous contrast. CONTRAST:  1110mL OMNIPAQUE IOHEXOL 300 MG/ML  SOLN COMPARISON:  November 03, 2018 FINDINGS: Lower chest: There is extensive airspace consolidation involving the left lower lobe. There is a trace left-sided pleural effusion.The heart size is normal. There is a trace right-sided pleural effusion. There is likely a small pericardial effusion. Hepatobiliary: The liver is normal. Normal gallbladder.There is no biliary ductal dilation. Pancreas: Normal contours without ductal dilatation. No peripancreatic fluid collection. Spleen: No splenic laceration or hematoma. Adrenals/Urinary Tract: --Adrenal glands: There is some mild thickening of the adrenal glands bilaterally. This is stable from prior study. --Right kidney/ureter: No hydronephrosis or perinephric hematoma. --Left kidney/ureter: No hydronephrosis or perinephric hematoma. --Urinary bladder: Unremarkable. Stomach/Bowel: --Stomach/Duodenum: There is a malposition PEG tube located within the patient's anterior left abdominal wall. There is some surrounding soft tissue swelling without evidence of an abscess. --Small bowel: No dilatation or  inflammation. --Colon: Rectosigmoid diverticulosis without acute inflammation. --Appendix: Normal. Vascular/Lymphatic: Normal course and caliber of the major abdominal vessels. There is a circumaortic left renal vein, a normal variant. --No  retroperitoneal lymphadenopathy. --No mesenteric lymphadenopathy. --No pelvic or inguinal lymphadenopathy. Reproductive: Status post hysterectomy. No adnexal mass. Other: No ascites or free air. The abdominal wall is normal. Small soft tissue nodules in the low anterior abdominal wall are favored to be secondary to subcutaneous injections at this location. Musculoskeletal. No acute displaced fractures. IMPRESSION: 1. Malpositioned PEG tube, currently located within the anterior abdominal wall. Repositioning is recommended before use. 2. Airspace opacity in left lower lobe may represent treatment changes versus pneumonia. There are trace bilateral pleural effusions. 3. Sigmoid diverticulosis without CT evidence of diverticulitis. Electronically Signed   By: Constance Holster M.D.   On: 12/02/2018 21:12    Procedures Procedures (including critical care time)  Medications Ordered in ED Medications  HYDROcodone-acetaminophen (NORCO/VICODIN) 5-325 MG per tablet 1 tablet (1 tablet Oral Given 12/02/18 1957)  iohexol (OMNIPAQUE) 300 MG/ML solution 100 mL (100 mLs Intravenous Contrast Given 12/02/18 2046)  morphine 4 MG/ML injection 4 mg (4 mg Intravenous Given 12/02/18 2215)     Initial Impression / Assessment and Plan / ED Course  I have reviewed the triage vital signs and the nursing notes.  Pertinent labs & imaging results that were available during my care of the patient were reviewed by me and considered in my medical decision making (see chart for details).        Patient presenting for evaluation of abdominal pain, worsened after feeding tube use.  Physical exam shows patient is afebrile not tachycardic.  Abdominal tenderness surrounding the PEG tube, but no  obvious signs of external infection.  Patient had lab work yesterday which was reassuring, no leukocytosis.  As she had lab work yesterday, will not repeat today.  Will obtain CT for further evaluation.  CT shows malposition of the PEG tube, is no longer in the stomach but instead in the abdominal wall.  No obvious abscess.  CT also shows changes of the left lung, either treatment versus pneumonia.  As patient is without fever cough, low suspicion for pneumonia.  Will consult with Dr. Constance Haw regarding PEG tube management.  Discussed with Dr. Constance Haw, who recommended removing the PEG tube and attempting to place a Foley.  Successful, patient will need to stay overnight for Foley placement imaging.  If unsuccessful, patient should stay overnight and Dr. Constance Haw will attempt to place Foley tomorrow.  Requesting admission to the hospitalist.  Discussed plan with patient, who is agreeable.  COVID ordered.  PEG tube removed and Foley attempted to be placed.  On each attempt, Foley was inserted approximately 2 cm before meeting resistance.  Attempted with 20, 18, and 14 [place] without success.  Procedure performed under supervision of Dr. Sedonia Small.  Will call for admission.  Discussed with Dr. Maudie Mercury from triad hospitalist service, patient to be admitted.  Final Clinical Impressions(s) / ED Diagnoses   Final diagnoses:  PEG (percutaneous endoscopic gastrostomy) adjustment/replacement/removal [facility])    ED Discharge Orders    None       Franchot Heidelberg, PA-C 12/02/18 2248    Maudie Flakes, MD 12/08/18 [phone]

## 2018-12-03 ENCOUNTER — Encounter (HOSPITAL_COMMUNITY)
Admission: EM | Disposition: A | Discharge status: Home / Self Care | Payer: Self-pay | Source: Home / Self Care | Attending: Family Medicine

## 2018-12-03 DIAGNOSIS — R1310 Dysphagia, unspecified: Secondary | ICD-10-CM

## 2018-12-03 DIAGNOSIS — C3492 Malignant neoplasm of unspecified part of left bronchus or lung: Secondary | ICD-10-CM

## 2018-12-03 DIAGNOSIS — E119 Type 2 diabetes mellitus without complications: Secondary | ICD-10-CM

## 2018-12-03 DIAGNOSIS — K9423 Gastrostomy malfunction: Principal | ICD-10-CM

## 2018-12-03 DIAGNOSIS — E440 Moderate protein-calorie malnutrition: Secondary | ICD-10-CM | POA: Insufficient documentation

## 2018-12-03 LAB — GLUCOSE, CAPILLARY
Glucose-Capillary: 104 mg/dL — ABNORMAL HIGH (ref 70–99)
Glucose-Capillary: 65 mg/dL — ABNORMAL LOW (ref 70–99)
Glucose-Capillary: 66 mg/dL — ABNORMAL LOW (ref 70–99)
Glucose-Capillary: 75 mg/dL (ref 70–99)
Glucose-Capillary: 76 mg/dL (ref 70–99)
Glucose-Capillary: 81 mg/dL (ref 70–99)
Glucose-Capillary: 89 mg/dL (ref 70–99)

## 2018-12-03 LAB — SURGICAL PCR SCREEN
MRSA, PCR: NEGATIVE
Staphylococcus aureus: NEGATIVE

## 2018-12-03 LAB — PROTIME-INR
INR: 1.1 (ref 0.8–1.2)
Prothrombin Time: 14.2 seconds (ref 11.4–15.2)

## 2018-12-03 SURGERY — INSERTION, PEG TUBE
Anesthesia: Choice

## 2018-12-03 MED ORDER — GLUCOSE 40 % PO GEL
ORAL | Status: AC
  Filled 2018-12-03: qty 1

## 2018-12-03 MED ORDER — DEXTROSE-NACL 5-0.9 % IV SOLN
INTRAVENOUS | Status: DC
  Administered 2018-12-03 – 2018-12-05 (×3): via INTRAVENOUS

## 2018-12-03 MED ORDER — ONDANSETRON 4 MG PO TBDP
4.0000 mg | ORAL_TABLET | Freq: Three times a day (TID) | ORAL | Status: DC | PRN
  Administered 2018-12-03: 4 mg via ORAL
  Filled 2018-12-03: qty 1

## 2018-12-03 MED ORDER — HYDRALAZINE HCL 20 MG/ML IJ SOLN: 5.0000 mg | Freq: Four times a day (QID) | INTRAMUSCULAR | Status: DC | PRN

## 2018-12-03 MED ORDER — AMLODIPINE BESYLATE 5 MG PO TABS
5.0000 mg | ORAL_TABLET | Freq: Every day | ORAL | Status: DC
  Administered 2018-12-03 – 2018-12-04 (×2): 5 mg via ORAL
  Filled 2018-12-03 (×3): qty 1

## 2018-12-03 MED ORDER — ENSURE ENLIVE PO LIQD
237.0000 mL | Freq: Every day | ORAL | Status: DC
  Administered 2018-12-03 – 2018-12-04 (×8): 237 mL via ORAL

## 2018-12-03 MED ORDER — DEXTROSE 50 % IV SOLN
INTRAVENOUS | Status: AC
  Administered 2018-12-03: 50 mL
  Filled 2018-12-03: qty 50

## 2018-12-03 MED ORDER — POTASSIUM CHLORIDE CRYS ER 10 MEQ PO TBCR
20.0000 meq | EXTENDED_RELEASE_TABLET | Freq: Two times a day (BID) | ORAL | Status: DC
  Administered 2018-12-03 – 2018-12-04 (×4): 20 meq via ORAL
  Filled 2018-12-03 (×5): qty 2

## 2018-12-03 MED ORDER — ALBUTEROL SULFATE (2.5 MG/3ML) 0.083% IN NEBU: 2.5000 mg | INHALATION_SOLUTION | Freq: Four times a day (QID) | RESPIRATORY_TRACT | Status: DC | PRN

## 2018-12-03 MED ORDER — ZOLPIDEM TARTRATE 5 MG PO TABS: 5.0000 mg | ORAL_TABLET | Freq: Every evening | ORAL | Status: DC | PRN

## 2018-12-03 MED ORDER — COLCHICINE 0.6 MG PO TABS
0.6000 mg | ORAL_TABLET | Freq: Every day | ORAL | Status: DC
  Administered 2018-12-03 – 2018-12-04 (×2): 0.6 mg via ORAL
  Filled 2018-12-03 (×3): qty 1

## 2018-12-03 MED ORDER — PANTOPRAZOLE SODIUM 40 MG PO TBEC
40.0000 mg | DELAYED_RELEASE_TABLET | Freq: Every day | ORAL | Status: DC
  Administered 2018-12-03 – 2018-12-04 (×2): 40 mg via ORAL
  Filled 2018-12-03 (×3): qty 1

## 2018-12-03 MED ORDER — OSIMERTINIB MESYLATE 40 MG PO TABS
40.0000 mg | ORAL_TABLET | Freq: Every day | ORAL | Status: DC
  Filled 2018-12-03 (×3): qty 1

## 2018-12-03 MED ORDER — BOOST PO LIQD
237.0000 mL | Freq: Four times a day (QID) | ORAL | Status: DC
  Administered 2018-12-03 (×2): 237 mL via ORAL
  Filled 2018-12-03 (×6): qty 237

## 2018-12-03 MED ORDER — MORPHINE SULFATE (PF) 2 MG/ML IV SOLN
2.0000 mg | Freq: Once | INTRAVENOUS | Status: AC
  Administered 2018-12-03: 2 mg via INTRAVENOUS
  Filled 2018-12-03: qty 1

## 2018-12-03 MED ORDER — LORAZEPAM 0.5 MG PO TABS: 0.5000 mg | ORAL_TABLET | Freq: Every evening | ORAL | Status: DC | PRN

## 2018-12-03 MED ORDER — ENOXAPARIN SODIUM 120 MG/0.8ML ~~LOC~~ SOLN
1.5000 mg/kg | SUBCUTANEOUS | Status: DC
  Administered 2018-12-03 (×2): 110 mg via SUBCUTANEOUS
  Filled 2018-12-03: qty 0.8

## 2018-12-03 MED ORDER — METOCLOPRAMIDE HCL 10 MG PO TABS: 10.0000 mg | ORAL_TABLET | Freq: Every day | ORAL | Status: DC | PRN

## 2018-12-03 NOTE — Progress Notes (Signed)
ANTICOAGULATION CONSULT NOTE - Initial Consult  Pharmacy Consult for Lovenox Indication: pulmonary embolus  Allergies  Allergen Reactions  . Banana Anaphylaxis  . Aspirin Other (See Comments)    G.I. Upset     Patient Measurements: Height: 5\' 6"  (167.6 cm) Weight: 157 lb 12.8 oz (71.6 kg) IBW/kg (Calculated) : 59.3  Vital Signs: Temp: 97 F (36.1 C) (07/07 1619) Temp Source: Oral (07/07 1619) BP: 119/86 (07/08 0039) Pulse Rate: 89 (07/08 0039)  Labs: Recent Labs    12/01/18 1442  HGB 13.2  HCT 42.6  PLT 270  CREATININE 0.87    Estimated Creatinine Clearance: 62.7 mL/min (by C-G formula based on SCr of 0.87 mg/dL).   Medical History: Past Medical History:  Diagnosis Date  . Asthma   . Cancer ([place]) 04/21/2018   Lung Cancer   . Carpal tunnel syndrome   . Cataract    left eye  . Diabetes mellitus without complication ([place])   . DVT (deep venous thrombosis) ([place])   . Hypertension   . Pneumonia 04/21/2018  . Pulmonary embolism ([place]) 03/2018    Medications:  Medications Prior to Admission  Medication Sig Dispense Refill Last Dose  . acetaminophen (TYLENOL) 325 MG tablet Take 2 tablets (650 mg total) by mouth every 6 (six) hours as needed for mild pain (or Fever >/= 101). 40 tablet 1 unknown  . albuterol (PROVENTIL HFA;VENTOLIN HFA) 108 (90 BASE) MCG/ACT inhaler Inhale 2 puffs into the lungs every 6 (six) hours as needed for wheezing.   unknown  . albuterol (PROVENTIL) (2.5 MG/3ML) 0.083% nebulizer solution Take 3 mLs (2.5 mg total) by nebulization every 6 (six) hours as needed for wheezing or shortness of breath. 75 mL 2 unknown  . amLODipine (NORVASC) 5 MG tablet Take 5 mg by mouth daily.   12/01/2018 at Unknown time  . colchicine 0.6 MG tablet Take 1 tablet (0.6 mg total) by mouth daily. 15 tablet 0 12/01/2018 at Unknown time  . enoxaparin (LOVENOX) 150 MG/ML injection Inject 150 mg into the skin daily.    12/01/2018 at 1100  . ibuprofen (ADVIL) 200 MG tablet Take  200 mg by mouth every 6 (six) hours as needed for mild pain or moderate pain.   12/02/2018 at Unknown time  . lactose free nutrition (BOOST) LIQD Take 237 mLs by mouth 4 (four) times daily.   12/02/2018 at Unknown time  . LORazepam (ATIVAN) 0.5 MG tablet Take 0.5 mg by mouth at bedtime as needed for anxiety.   12/01/2018 at Unknown time  . metoCLOPramide (REGLAN) 10 MG tablet Take 1 tablet (10 mg total) by mouth every 8 (eight) hours as needed for refractory nausea / vomiting. (Patient taking differently: Take 10 mg by mouth daily as needed for nausea or vomiting. ) 30 tablet 0 unknown  . Nutritional Supplements (FEEDING SUPPLEMENT, OSMOLITE 1.5 CAL,) LIQD Place 240 mLs into feeding tube 5 (five) times daily. (Patient taking differently: Place 240 mLs into feeding tube See admin instructions. *May use three to four times daily. Drinks Boost 4 times daily) 36000 mL 2 12/02/2018 at Unknown time  . ondansetron (ZOFRAN ODT) 4 MG disintegrating tablet Take 1 tablet (4 mg total) by mouth every 8 (eight) hours as needed for nausea or vomiting. 30 tablet 2 12/01/2018 at Unknown time  . osimertinib mesylate (TAGRISSO) 40 MG tablet Take 1 tablet (40 mg total) by mouth daily. 28 tablet 0 12/02/2018 at Unknown time  . pantoprazole (PROTONIX) 40 MG tablet TAKE 1 TABLET BY MOUTH  ONCE DAILY. (Patient taking differently: Take 40 mg by mouth daily. ) 30 tablet 0 12/02/2018 at Unknown time  . potassium chloride (K-DUR) 10 MEQ tablet Take 2 tablets (20 mEq total) by mouth 2 (two) times daily. 120 tablet 2 12/02/2018 at Unknown time  . prochlorperazine (COMPAZINE) 10 MG tablet TAKE (1) TABLET BY MOUTH EVERY SIX HOURS AS NEEDED FOR NAUSEA OR VOMITING. (Patient taking differently: Take 10 mg by mouth every 6 (six) hours as needed for nausea or vomiting. ) 30 tablet 10 unknown  . Water For Irrigation, Sterile (FREE WATER) SOLN Place 200 mLs into feeding tube every 6 (six) hours.   12/02/2018 at Unknown time  . zolpidem (AMBIEN CR) 6.25 MG CR  tablet Take 1 tablet (6.25 mg total) by mouth at bedtime as needed for sleep. (Patient taking differently: Take 6.25 mg by mouth at bedtime. ) 30 tablet 0 12/01/2018 at Unknown time    Assessment: Kaitlin [name] on Lovenox 150 mg subQ q24 hours PTA for recurrent PE, pharmacy consulted to dose while inpatient. Last dose reported on 7/6. CBC and SCr WNL  Goal of Therapy:  Anti-Xa level 0.6-1 units/ml 4hrs after LMWH dose given Monitor platelets by anticoagulation protocol: Yes   Plan:  Lovenox 110 mg (1.5 mg/kg) subQ every 24 hours Monitor CBC, SCr, and for signs and symptoms of bleeding Follow up outpatient dosing   Thank you for allowing [place] to participate in this patients care. Jens Som, PharmD

## 2018-12-03 NOTE — Progress Notes (Signed)
PROGRESS NOTE    Kaitlin [name]  NWG:[id number]  DOB: Aug 09, 1950  DOA: 12/02/2018 PCP: [name], MD   Brief Admission Hx: 68 y.o. female,w lung cancer (adenocarcinoma) with brain mets, hypertension, h/o DVT/ PE, hypokalemia, severe protein calorie malnutrition, dysphagia with PEG placement on 09/12/2018 presents with c/o PEG clogged up since Saturday.   MDM/Assessment & Plan:   1. PEG malfunction - appreciate assistance from Dr. Henreitta Leber, planning for replacement on 7/10.   2. Severe protein calorie malnutrition - consult to dietitian appreciated.  See orders 3. Hypoglycemia - added dextrose to IV fluids.  4. GERD - continue PPI.  5. H/o PE/DVT - temporarily holding lovenox, continue SCDs.  6. Hypertension  - stable, follow.   DVT prophylaxis: SCDs Code Status: DNR  Family Communication: patient at bedside Disposition Plan:    Consultants:  Surgery  IR  Procedures:    Antimicrobials:     Subjective: Pt says she wants to eat.  She eats regular diet but small amounts normally.   Objective: Vitals:   12/03/18 0833 12/03/18 0916 12/03/18 1428 12/03/18 1512  BP: 113/82 102/70 100/68 90/61  Pulse: 62  76 73  Resp: 20   18  Temp: 98 F (36.7 C)  97.7 F (36.5 C) 97.9 F (36.6 C)  TempSrc: Oral  Oral Oral  SpO2: 100%  100% 98%  Weight:      Height:        Intake/Output Summary (Last 24 hours) at 12/03/2018 1731 Last data filed at 12/03/2018 0600 Gross per 24 hour  Intake 49.2 ml  Output 1 ml  Net 48.2 ml   Filed Weights   12/02/18 1621 12/03/18 0039  Weight: 71.6 kg 70.9 kg     REVIEW OF SYSTEMS  As per history otherwise all reviewed and reported negative  Exam:  General exam: awake, alert NAD.  Respiratory system: Clear. No increased work of breathing. Cardiovascular system: S1 & S2 heard. No JVD, murmurs, gallops, clicks or pedal edema. Gastrointestinal system: Abdomen is nondistended, soft and nontender. Normal bowel sounds heard. Central  nervous system: Alert and oriented. No focal neurological deficits. Extremities: no CCE.  Data Reviewed: Basic Metabolic Panel: Recent Labs  Lab 12/01/18 1442  NA 138  K 4.1  CL 104  CO2 25  GLUCOSE 99  BUN 17  CREATININE 0.87  CALCIUM 9.9  MG 1.9   Liver Function Tests: Recent Labs  Lab 12/01/18 1442  AST 25  ALT 41  ALKPHOS 64  BILITOT 0.5  PROT 7.6  ALBUMIN 3.7   No results for input(s): LIPASE, AMYLASE in the last 168 hours. No results for input(s): AMMONIA in the last 168 hours. CBC: Recent Labs  Lab 12/01/18 1442  WBC 5.8  NEUTROABS 3.4  HGB 13.2  HCT 42.6  MCV 84.7  PLT 270   Cardiac Enzymes: No results for input(s): CKTOTAL, CKMB, CKMBINDEX, TROPONINI in the last 168 hours. CBG (last 3)  Recent Labs    12/03/18 0720 12/03/18 1116 12/03/18 1620  GLUCAP 76 75 104*   Recent Results (from the past 240 hour(s))  SARS Coronavirus 2 (CEPHEID - Performed in [facility] hospital lab), Hosp Order     Status: None   Collection Time: 12/02/18  9:55 PM   Specimen: Nasopharyngeal Swab  Result Value Ref Range Status   SARS Coronavirus 2 NEGATIVE NEGATIVE Final    Comment: (NOTE) If result is NEGATIVE SARS-CoV-2 target nucleic acids are NOT DETECTED. The SARS-CoV-2 RNA is generally detectable in  upper and lower  respiratory specimens during the acute phase of infection. The lowest  concentration of SARS-CoV-2 viral copies this assay can detect is 250  copies / mL. A negative result does not preclude SARS-CoV-2 infection  and should not be used as the sole basis for treatment or other  patient management decisions.  A negative result may occur with  improper specimen collection / handling, submission of specimen other  than nasopharyngeal swab, presence of viral mutation(s) within the  areas targeted by this assay, and inadequate number of viral copies  (<250 copies / mL). A negative result must be combined with clinical  observations, patient history,  and epidemiological information. If result is POSITIVE SARS-CoV-2 target nucleic acids are DETECTED. The SARS-CoV-2 RNA is generally detectable in upper and lower  respiratory specimens dur ing the acute phase of infection.  Positive  results are indicative of active infection with SARS-CoV-2.  Clinical  correlation with patient history and other diagnostic information is  necessary to determine patient infection status.  Positive results do  not rule out bacterial infection or co-infection with other viruses. If result is PRESUMPTIVE POSTIVE SARS-CoV-2 nucleic acids MAY BE PRESENT.   A presumptive positive result was obtained on the submitted specimen  and confirmed on repeat testing.  While 2019 novel coronavirus  (SARS-CoV-2) nucleic acids may be present in the submitted sample  additional confirmatory testing may be necessary for epidemiological  and / or clinical management purposes  to differentiate between  SARS-CoV-2 and other Sarbecovirus currently known to infect humans.  If clinically indicated additional testing with an alternate test  methodology [phone]) is advised. The SARS-CoV-2 RNA is generally  detectable in upper and lower respiratory sp ecimens during the acute  phase of infection. The expected result is Negative. Fact Sheet for Patients:  BoilerBrush.com.cy Fact Sheet for Healthcare Providers: https://pope.com/ This test is not yet approved or cleared by the [place] FDA and has been authorized for detection and/or diagnosis of SARS-CoV-2 by FDA under an Emergency Use Authorization (EUA).  This EUA will remain in effect (meaning this test can be used) for the duration of the COVID-19 declaration under Section 564(b)(1) of the Act, 21 U.S.C. section 360bbb-3(b)(1), unless the authorization is terminated or revoked sooner. Performed at [facility], [address], [place], [place] [address]   Surgical pcr  screen     Status: None   Collection Time: 12/03/18 12:59 AM   Specimen: Nasal Mucosa; Nasal Swab  Result Value Ref Range Status   MRSA, PCR NEGATIVE NEGATIVE Final   Staphylococcus aureus NEGATIVE NEGATIVE Final    Comment: (NOTE) The Xpert SA Assay (FDA approved for NASAL specimens in patients 87 years of age and older), is one component of a comprehensive surveillance program. It is not intended to diagnose infection nor to guide or monitor treatment. Performed at [facility], [address], [place], [place] [address]      Studies: Ct Abdomen Pelvis W Contrast  Result Date: 12/02/2018 CLINICAL DATA:  Abdominal pain with a feeding tube. EXAM: CT ABDOMEN AND PELVIS WITH CONTRAST TECHNIQUE: Multidetector CT imaging of the abdomen and pelvis was performed using the standard protocol following bolus administration of intravenous contrast. CONTRAST:  OMNIPAQUE IOHEXOL 300 MG/ML  SOLN COMPARISON:  November 03, 2018 FINDINGS: Lower chest: There is extensive airspace consolidation involving the left lower lobe. There is a trace left-sided pleural effusion.The heart size is normal. There is a trace right-sided pleural effusion. There is likely a  small pericardial effusion. Hepatobiliary: The liver is normal. Normal gallbladder.There is no biliary ductal dilation. Pancreas: Normal contours without ductal dilatation. No peripancreatic fluid collection. Spleen: No splenic laceration or hematoma. Adrenals/Urinary Tract: --Adrenal glands: There is some mild thickening of the adrenal glands bilaterally. This is stable from prior study. --Right kidney/ureter: No hydronephrosis or perinephric hematoma. --Left kidney/ureter: No hydronephrosis or perinephric hematoma. --Urinary bladder: Unremarkable. Stomach/Bowel: --Stomach/Duodenum: There is a malposition PEG tube located within the patient's anterior left abdominal wall. There is some surrounding soft tissue swelling without evidence of an abscess. --Small  bowel: No dilatation or inflammation. --Colon: Rectosigmoid diverticulosis without acute inflammation. --Appendix: Normal. Vascular/Lymphatic: Normal course and caliber of the major abdominal vessels. There is a circumaortic left renal vein, a normal variant. --No retroperitoneal lymphadenopathy. --No mesenteric lymphadenopathy. --No pelvic or inguinal lymphadenopathy. Reproductive: Status post hysterectomy. No adnexal mass. Other: No ascites or free air. The abdominal wall is normal. Small soft tissue nodules in the low anterior abdominal wall are favored to be secondary to subcutaneous injections at this location. Musculoskeletal. No acute displaced fractures. IMPRESSION: 1. Malpositioned PEG tube, currently located within the anterior abdominal wall. Repositioning is recommended before use. 2. Airspace opacity in left lower lobe may represent treatment changes versus pneumonia. There are trace bilateral pleural effusions. 3. Sigmoid diverticulosis without CT evidence of diverticulitis. Electronically Signed   By: Katherine Mantle M.D.   On: 12/02/2018 21:12     Scheduled Meds: . amLODipine  5 mg Oral Daily  . colchicine  0.6 mg Oral Daily  . feeding supplement (ENSURE ENLIVE)  237 mL Oral 5 X Daily  . insulin aspart  0-9 Units Subcutaneous Q4H  . pantoprazole  40 mg Oral Daily  . potassium chloride  20 mEq Oral BID   Continuous Infusions: . dextrose 5 % and 0.9% NaCl 50 mL/hr at 12/03/18 5366    Principal Problem:   PEG tube malfunction (HCC) Active Problems:   Dysphagia   Hypertension   Diabetes mellitus without complication (HCC)   Adenocarcinoma, lung, left (HCC)   History of pulmonary embolism   Malnutrition of moderate degree   Time spent:   Standley Dakins, MD [facility] 12/03/2018, 5:31 PM    LOS: 0 days  How to contact the [facility] Attending or Consulting provider 7A - 7P or covering provider during after hours 7P -7A, for this patient?  1. Check the care team in [facility]  and look for a) attending/consulting [facility] provider listed and b) the [facility] team listed 2. Log into [email or web address] and use [facility] universal password to access. If you do not have the password, please contact the hospital operator. 3. Locate the [facility] provider you are looking for under [facility] and page to a number that you can be directly reached. 4. If you still have difficulty reaching the provider, please page the [facility] (Director on Call) for the Hospitalists listed on amion for assistance.

## 2018-12-03 NOTE — Progress Notes (Signed)
Pt c/o right shoulder pain, rated 9/10. Tylenol administered per order.

## 2018-12-03 NOTE — Progress Notes (Signed)
Patient cbg at 0400 was in the 49s.  Patient npo. Gave dextrose iv to increase cbg, which increased to 89.  Will continue to monitor patient.

## 2018-12-03 NOTE — Progress Notes (Signed)
Patient ID: Kaitlin [name], female   DOB: Jun 20, 1950, 68 y.o.   MRN: [id number] Request received for either attempted replacement or new placement of pt's surgically placed gastrostomy tube. Case d/w Dr. Delaney Meigs) and based on our current IR schedule earliest procedure could be done would be on 7/10. Will cont to monitor- plans noted for poss placement of tube by Dr. Arnoldo Morale on 7/10.

## 2018-12-03 NOTE — Consult Note (Signed)
[facility] Consult  Reason for Consult: Dislodged PEG  Referring Physician:  Dr. Bonner Puna Kaitlin [name] is a 68 y.o. female.  HPI: Kaitlin [name] is a 68 yo with a history of metastatic lung cancer and poor po intake who had a PEG placed 08/2018 by me.  Kaitlin [name] has been doing fair and taking in some oral intake but mostly tube feeds per her report. Kaitlin [name] says that the tube became painful on Saturday with feeds and flushing.  Kaitlin [name] called the office Tuesday saying the tube was clogged and painful, and we told her to go to the ED so Kaitlin [name] could get evaluated with imaging given her complaints. In the ED Kaitlin [name] was found to have a dislodged tube without any fluid collections or signs of gastric leak. The bumper was in the muscle, and I asked the PA to remove it and attempt replacement with a foley. This was unsuccessful. I attempted placement this AM at bedside but again unsuccessful. Given the time from her onset of complaints, I am sure the tunnel has likely closed.  I posted her for repeat PEG today, but Kaitlin [name] unfortunately received lovenox 110mg  at 556 and maybe also at 327 as there are two doses documented.  I have called IR and spoke with Dr. Earleen Newport about attempting replacement with IR. He says that they will attempt placement tomorrow, but it could be delayed. I have spoke with Dr. Arnoldo Morale my partner, and he will try on Friday if IR unable to get to the procedure.   Past Medical History:  Diagnosis Date  . Asthma   . Cancer ([place]) 04/21/2018   Lung Cancer   . Carpal tunnel syndrome   . Cataract    left eye  . Diabetes mellitus without complication ([place])   . DVT (deep venous thrombosis) ([place])   . Hypertension   . Pneumonia 04/21/2018  . Pulmonary embolism ([place]) 03/2018    Past Surgical History:  Procedure Laterality Date  . ABDOMINAL HYSTERECTOMY    . biopsy of right breast    . ESOPHAGOGASTRODUODENOSCOPY (EGD) WITH PROPOFOL N/A 09/12/2018   Procedure: ESOPHAGOGASTRODUODENOSCOPY (EGD)  WITH PROPOFOL (scope in 1017, scope out 1032);  Surgeon: Virl Cagey, MD;  Location: [facility];  Service: General;  Laterality: N/A;  . FOOT SURGERY    . implant left eye    . INTRAOCULAR LENS INSERTION    . PEG PLACEMENT N/A 09/12/2018   Procedure: PERCUTANEOUS ENDOSCOPIC GASTROSTOMY (PEG) PLACEMENT;  Surgeon: Virl Cagey, MD;  Location: [facility];  Service: General;  Laterality: N/A;    Family History  Problem Relation Age of Onset  . Heart failure Mother   . Stroke Mother   . Heart attack Mother   . Kidney failure Other   . Leukemia Father     Social History   Tobacco Use  . Smoking status: Never Smoker  . Smokeless tobacco: Never Used  Substance Use Topics  . Alcohol use: Not Currently    Comment: occ  . Drug use: No    Medications:  I have reviewed the patient's current medications. Prior to Admission:  Medications Prior to Admission  Medication Sig Dispense Refill Last Dose  . acetaminophen (TYLENOL) 325 MG tablet Take 2 tablets (650 mg total) by mouth every 6 (six) hours as needed for mild pain (or Fever >/= 101). 40 tablet 1 unknown  . albuterol (PROVENTIL HFA;VENTOLIN HFA) 108 (90 BASE) MCG/ACT inhaler Inhale 2 puffs into the lungs every 6 (  six) hours as needed for wheezing.   unknown  . albuterol (PROVENTIL) (2.5 MG/3ML) 0.083% nebulizer solution Take 3 mLs (2.5 mg total) by nebulization every 6 (six) hours as needed for wheezing or shortness of breath. 75 mL 2 unknown  . amLODipine (NORVASC) 5 MG tablet Take 5 mg by mouth daily.   12/01/2018 at Unknown time  . colchicine 0.6 MG tablet Take 1 tablet (0.6 mg total) by mouth daily. 15 tablet 0 12/01/2018 at Unknown time  . enoxaparin (LOVENOX) 150 MG/ML injection Inject 150 mg into the skin daily.    12/01/2018 at 1100  . ibuprofen (ADVIL) 200 MG tablet Take 200 mg by mouth every 6 (six) hours as needed for mild pain or moderate pain.   12/02/2018 at Unknown time  . lactose free nutrition (BOOST) LIQD Take 237 mLs by  mouth 4 (four) times daily.   12/02/2018 at Unknown time  . LORazepam (ATIVAN) 0.5 MG tablet Take 0.5 mg by mouth at bedtime as needed for anxiety.   12/01/2018 at Unknown time  . metoCLOPramide (REGLAN) 10 MG tablet Take 1 tablet (10 mg total) by mouth every 8 (eight) hours as needed for refractory nausea / vomiting. (Patient taking differently: Take 10 mg by mouth daily as needed for nausea or vomiting. ) 30 tablet 0 unknown  . Nutritional Supplements (FEEDING SUPPLEMENT, OSMOLITE 1.5 CAL,) LIQD Place 240 mLs into feeding tube 5 (five) times daily. (Patient taking differently: Place 240 mLs into feeding tube See admin instructions. *May use three to four times daily. Drinks Boost 4 times daily) 36000 mL 2 12/02/2018 at Unknown time  . ondansetron (ZOFRAN ODT) 4 MG disintegrating tablet Take 1 tablet (4 mg total) by mouth every 8 (eight) hours as needed for nausea or vomiting. 30 tablet 2 12/01/2018 at Unknown time  . osimertinib mesylate (TAGRISSO) 40 MG tablet Take 1 tablet (40 mg total) by mouth daily. 28 tablet 0 12/02/2018 at Unknown time  . pantoprazole (PROTONIX) 40 MG tablet TAKE 1 TABLET BY MOUTH ONCE DAILY. (Patient taking differently: Take 40 mg by mouth daily. ) 30 tablet 0 12/02/2018 at Unknown time  . potassium chloride (K-DUR) 10 MEQ tablet Take 2 tablets (20 mEq total) by mouth 2 (two) times daily. 120 tablet 2 12/02/2018 at Unknown time  . prochlorperazine (COMPAZINE) 10 MG tablet TAKE (1) TABLET BY MOUTH EVERY SIX HOURS AS NEEDED FOR NAUSEA OR VOMITING. (Patient taking differently: Take 10 mg by mouth every 6 (six) hours as needed for nausea or vomiting. ) 30 tablet 10 unknown  . Water For Irrigation, Sterile (FREE WATER) SOLN Place 200 mLs into feeding tube every 6 (six) hours.   12/02/2018 at Unknown time  . zolpidem (AMBIEN CR) 6.25 MG CR tablet Take 1 tablet (6.25 mg total) by mouth at bedtime as needed for sleep. (Patient taking differently: Take 6.25 mg by mouth at bedtime. ) 30 tablet 0 12/01/2018  at Unknown time   Scheduled: . amLODipine  5 mg Oral Daily  . colchicine  0.6 mg Oral Daily  . enoxaparin (LOVENOX) injection  1.5 mg/kg Subcutaneous Q24H  . insulin aspart  0-9 Units Subcutaneous Q4H  . lactose free nutrition  237 mL Oral QID  .  morphine injection  2 mg Intravenous Once  . osimertinib mesylate  40 mg Oral Daily  . pantoprazole  40 mg Oral Daily  . potassium chloride  20 mEq Oral BID   Continuous:  ZOX:[id number] **OR** acetaminophen, albuterol, hydrALAZINE, LORazepam, metoCLOPramide, ondansetron, zolpidem  Allergies  Allergen Reactions  . Banana Anaphylaxis  . Aspirin Other (See Comments)    G.I. Upset      ROS:  A comprehensive review of systems was negative except for: Gastrointestinal: positive for pain around PEG site with attempt at replacement with foley  Blood pressure 111/75, pulse 76, temperature 97.7 F (36.5 C), resp. rate 20, height 5\' 6"  (1.676 m), weight 70.9 kg, SpO2 99 %. Physical Exam Vitals signs reviewed.  Constitutional:      Appearance: Normal appearance.  HENT:     Head: Normocephalic.     Nose: Nose normal.     Mouth/Throat:     Mouth: Mucous membranes are moist.  Eyes:     Extraocular Movements: Extraocular movements intact.     Pupils: Pupils are equal, round, and reactive to light.  Neck:     Musculoskeletal: Normal range of motion.  Cardiovascular:     Rate and Rhythm: Normal rate.  Pulmonary:     Effort: Pulmonary effort is normal.  Abdominal:     General: There is no distension.     Palpations: Abdomen is soft.     Tenderness: There is abdominal tenderness.     Comments: Tender around PEG site, no drainage  Musculoskeletal: Normal range of motion.        General: No swelling.  Skin:    General: Skin is warm and dry.  Neurological:     General: No focal deficit present.     Mental Status: Kaitlin [name] is alert and oriented to person, place, and time.  Psychiatric:        Mood and Affect: Mood normal.         Behavior: Behavior normal.        Thought Content: Thought content normal.        Judgment: Judgment normal.     Results: Results for orders placed or performed during the hospital encounter of 12/02/18 (from the past 48 hour(s))  SARS Coronavirus 2 (CEPHEID - Performed in [place] hospital lab), Hosp Order     Status: None   Collection Time: 12/02/18  9:55 PM   Specimen: Nasopharyngeal Swab  Result Value Ref Range   SARS Coronavirus 2 NEGATIVE NEGATIVE    Comment: (NOTE) If result is NEGATIVE SARS-CoV-2 target nucleic acids are NOT DETECTED. The SARS-CoV-2 RNA is generally detectable in upper and lower  respiratory specimens during the acute phase of infection. The lowest  concentration of SARS-CoV-2 viral copies this assay can detect is 250  copies / mL. A negative result does not preclude SARS-CoV-2 infection  and should not be used as the sole basis for treatment or other  patient management decisions.  A negative result may occur with  improper specimen collection / handling, submission of specimen other  than nasopharyngeal swab, presence of viral mutation(s) within the  areas targeted by this assay, and inadequate number of viral copies  (<250 copies / mL). A negative result must be combined with clinical  observations, patient history, and epidemiological information. If result is POSITIVE SARS-CoV-2 target nucleic acids are DETECTED. The SARS-CoV-2 RNA is generally detectable in upper and lower  respiratory specimens dur ing the acute phase of infection.  Positive  results are indicative of active infection with SARS-CoV-2.  Clinical  correlation with patient history and other diagnostic information is  necessary to determine patient infection status.  Positive results do  not rule out bacterial infection or co-infection with other viruses. If result is PRESUMPTIVE POSTIVE SARS-CoV-2  nucleic acids MAY BE PRESENT.   A presumptive positive result was obtained on the  submitted specimen  and confirmed on repeat testing.  While 2019 novel coronavirus  (SARS-CoV-2) nucleic acids may be present in the submitted sample  additional confirmatory testing may be necessary for epidemiological  and / or clinical management purposes  to differentiate between  SARS-CoV-2 and other Sarbecovirus currently known to infect humans.  If clinically indicated additional testing with an alternate test  methodology [phone]) is advised. The SARS-CoV-2 RNA is generally  detectable in upper and lower respiratory sp ecimens during the acute  phase of infection. The expected result is Negative. Fact Sheet for Patients:  StrictlyIdeas.no Fact Sheet for Healthcare Providers: BankingDealers.co.za This test is not yet approved or cleared by the [place] FDA and has been authorized for detection and/or diagnosis of SARS-CoV-2 by FDA under an Emergency Use Authorization (EUA).  This EUA will remain in effect (meaning this test can be used) for the duration of the COVID-19 declaration under Section 564(b)(1) of the Act, 21 U.S.C. section 360bbb-3(b)(1), unless the authorization is terminated or revoked sooner. Performed at [facility], [address], [place], [place] [address]   Glucose, capillary     Status: Abnormal   Collection Time: 12/03/18 12:37 AM  Result Value Ref Range   Glucose-Capillary 65 (L) 70 - 99 mg/dL  Surgical pcr screen     Status: None   Collection Time: 12/03/18 12:59 AM   Specimen: Nasal Mucosa; Nasal Swab  Result Value Ref Range   MRSA, PCR NEGATIVE NEGATIVE   Staphylococcus aureus NEGATIVE NEGATIVE    Comment: (NOTE) The Xpert SA Assay (FDA approved for NASAL specimens in patients 53 years of age and older), is one component of a comprehensive surveillance program. It is not intended to diagnose infection nor to guide or monitor treatment. Performed at [facility], [address] [place],  [place] [address]   Glucose, capillary     Status: Abnormal   Collection Time: 12/03/18  4:35 AM  Result Value Ref Range   Glucose-Capillary 66 (L) 70 - 99 mg/dL  Glucose, capillary     Status: None   Collection Time: 12/03/18  5:30 AM  Result Value Ref Range   Glucose-Capillary 89 70 - 99 mg/dL  Glucose, capillary     Status: None   Collection Time: 12/03/18  7:20 AM  Result Value Ref Range   Glucose-Capillary 76 70 - 99 mg/dL   Reviewed- no fluid pockets or signs of gastric leak noted, bumper was in muscle area, sagittal with possible tunnel but unable to find with foley  Ct Abdomen Pelvis W Contrast  Result Date: 12/02/2018 CLINICAL DATA:  Abdominal pain with a feeding tube. EXAM: CT ABDOMEN AND PELVIS WITH CONTRAST TECHNIQUE: Multidetector CT imaging of the abdomen and pelvis was performed using the standard protocol following bolus administration of intravenous contrast. CONTRAST:  15mL OMNIPAQUE IOHEXOL 300 MG/ML  SOLN COMPARISON:  November 03, 2018 FINDINGS: Lower chest: There is extensive airspace consolidation involving the left lower lobe. There is a trace left-sided pleural effusion.The heart size is normal. There is a trace right-sided pleural effusion. There is likely a small pericardial effusion. Hepatobiliary: The liver is normal. Normal gallbladder.There is no biliary ductal dilation. Pancreas: Normal contours without ductal dilatation. No peripancreatic fluid collection. Spleen: No splenic laceration or hematoma. Adrenals/Urinary Tract: --Adrenal glands: There is some mild thickening of the adrenal glands bilaterally. This is stable from prior study. --Right kidney/ureter: No hydronephrosis  or perinephric hematoma. --Left kidney/ureter: No hydronephrosis or perinephric hematoma. --Urinary bladder: Unremarkable. Stomach/Bowel: --Stomach/Duodenum: There is a malposition PEG tube located within the patient's anterior left abdominal wall. There is some surrounding soft tissue swelling without  evidence of an abscess. --Small bowel: No dilatation or inflammation. --Colon: Rectosigmoid diverticulosis without acute inflammation. --Appendix: Normal. Vascular/Lymphatic: Normal course and caliber of the major abdominal vessels. There is a circumaortic left renal vein, a normal variant. --No retroperitoneal lymphadenopathy. --No mesenteric lymphadenopathy. --No pelvic or inguinal lymphadenopathy. Reproductive: Status post hysterectomy. No adnexal mass. Other: No ascites or free air. The abdominal wall is normal. Small soft tissue nodules in the low anterior abdominal wall are favored to be secondary to subcutaneous injections at this location. Musculoskeletal. No acute displaced fractures. IMPRESSION: 1. Malpositioned PEG tube, currently located within the anterior abdominal wall. Repositioning is recommended before use. 2. Airspace opacity in left lower lobe may represent treatment changes versus pneumonia. There are trace bilateral pleural effusions. 3. Sigmoid diverticulosis without CT evidence of diverticulitis. Electronically Signed   By: Constance Holster M.D.   On: 12/02/2018 21:12    Assessment & Plan:  DAAIYAH BAUMERT is a 68 y.o. female with dislodged PEG since possible Saturday. ED tried to replace last night but were unsuccessful. I attempted replacement this AM and could not get it. Unfortunately due to therapeutic lovenox, I am not going to be able to replace it today.  Kaitlin [name] has had the PEG dislodged for several hours, so Kaitlin [name] can eat.  -IR for attempt at replacement possible tomorrow, spoke with Dr,. Earleen Newport, versus PEG placement with Dr. Arnoldo Morale Friday -INR now -NPO at midnight -Hold anticoagulation   All questions were answered to the satisfaction of the patient.   Virl Cagey 12/03/2018, 7:41 AM

## 2018-12-03 NOTE — Plan of Care (Signed)

## 2018-12-03 NOTE — Care Management Obs Status (Signed)
[place] NOTIFICATION   Patient Details  Name: Kaitlin [name] MRN: [id number] Date of Birth: [birth year]   Medicare Observation Status Notification Given:  Yes    Tommy Medal 12/03/2018, 3:03 PM

## 2018-12-03 NOTE — Progress Notes (Signed)
Initial Nutrition Assessment  DOCUMENTATION CODES:   Non-severe (moderate) malnutrition in context of chronic illness  INTERVENTION:  At baseline, pt receives ~1100-1400 kcals from her TF. She also receives [id number] kcals daily from boost supplements.  Will order Ensure Enlive 5x daily to times daily to account for a majority of these missing kcals (1750 kcals, 100g Pro.   Please note, pt has reports severe hemorrhoids that "fall out" during BMs. She has trying to get in touch with GI as OP regarding banding. Would consider ordering a bowel regimen to avoid constipation with sedentary state.   NUTRITION DIAGNOSIS:  Inadequate oral intake related to cancer and cancer related treatments(nausea, dysgeusia, anorexia) as evidenced by having had to have PEG  placed due to FTT.  GOAL:  Patient will meet greater than or equal to 90% of their needs  MONITOR:  PO intake, Weight trends, TF tolerance, Vent status, Labs, I & O's, Supplement acceptance  REASON FOR ASSESSMENT:  Consult Assessment of nutrition requirement/status  ASSESSMENT:  68 y/o female w/ PMHx HTN, DM2, Stage IV adenocarcinoma of lung w/ brain mets. Dx in Dec. S/p WBXRT and on maintenance chemo.  D/t intractable nausea and resultant FTT, pt is s/p PEG and on supplemental TF. Presented to ED d/t pain w/ TF. CT showed PEG no longer terminated in stomach, but rather abdominal wall. PEG removed in ED, unable to reinsert foley to hold stoma open. Admitted for management.   Pt is extremely familiar to RD as she is patient at [facility]. RD has routinely spoken with patient at 1-2 week intervals since March of this year. Due to Covid precautions, the majority of these encounters have been over phone, most recent was on 6/30. Today, she looks much more wasted/ill then she did several months ago   Pt today states her abdominal pain began on Saturday. It became much worse on Sunday and Monday so she decided to come to ED Tuesday. She has  essentially gone without any tube feeding since Saturday. At baseline, patient infuses 3-4 cans of Osmolite 1.5 a day. She also consumes 3-4 boost original supplements each day. She does eat some "actual food", but it amount is minimal due to dysgeusia and anorexia, which Med Oncologist has attributed to patients chemo- actually had dose reduction this past office visit in attempt to address these issues. She states today she received the new chemo prescription in the mail yesterday.   Weight wise, pt was admitted at 156.3 lbs. Her last visit wt at cancer center was 159 on 6/15. When her lack of clothes is taken into account (weighed in street clothes/shoes outpatient), her weight is essentially the same. She had been stable at 159-163 for the past 3 months. She has lost roughly 40 lbs, or ~20% of her bw since her dx.   Pt still has a poor appetite. She is seen at lunch and she says she is full after eating ~10% of just her mashed potatoes. She normally receives ~50% of her daily needs from her TF. Now that she doesn't have TF access, need to drastically increase her Oral supplements. She prefers Boost, but was agreeable to trying Ensure Enlive, strawberry. Will order 5x/d while going without TF.  Labs:  BGs: 65-90, Albumin: 3.7. Labs largely WDL.  Meds: Insulin, PPI, KCL, IVF  Recent Labs  Lab 12/01/18 1442  NA 138  K 4.1  CL 104  CO2 25  BUN 17  CREATININE 0.87  CALCIUM 9.9  MG 1.9  GLUCOSE  99   NUTRITION - FOCUSED PHYSICAL EXAM:   Most Recent Value  Orbital Region  Mild depletion  Upper Arm Region  Mild depletion  Thoracic and Lumbar Region  Unable to assess  Buccal Region  Mild depletion  Temple Region  Severe depletion  Clavicle Bone Region  Moderate depletion  Clavicle and Acromion Bone Region  Moderate depletion  Scapular Bone Region  Unable to assess  Dorsal Hand  Unable to assess  Patellar Region  Unable to assess  Anterior Thigh Region  Unable to assess  Posterior Calf  Region  Unable to assess  Edema (RD Assessment)  None  Hair  Unable to assess  Eyes  Unable to assess  Mouth  Unable to assess  Skin  Unable to assess  Nails  Unable to assess     Diet Order:   Diet Order            Diet NPO time specified  Diet effective midnight        Diet regular Room service appropriate? Yes; Fluid consistency: Thin  Diet effective now             EDUCATION NEEDS:  No education needs have been identified at this time  Skin:  Skin Assessment: Reviewed RN Assessment  Last BM:  7/7  Height:  Ht Readings from Last 1 Encounters:  12/03/18 5\' 6"  (1.676 m)   Weight:  Wt Readings from Last 1 Encounters:  12/03/18 70.9 kg   Wt Readings from Last 10 Encounters:  12/03/18 70.9 kg  12/01/18 71.7 kg  11/10/18 72.1 kg  10/16/18 73.6 kg  10/01/18 72.1 kg  09/17/18 73.9 kg  09/11/18 72.5 kg  08/26/18 76.6 kg  08/15/18 76.2 kg  08/06/18 76.9 kg   Ideal Body Weight:  59.1 kg  BMI:  Body mass index is 25.23 kg/m.  Estimated Nutritional Needs:  Kcal:  1900-2150 kcals (27-30 kcal/kg bw) Protein:  92-106g Pro (1.3-1.5g/kg bw) Fluid:  1.9-2.1 L fluid (6ml/kcal)  Burtis Junes RD, LDN, CNSC Clinical Nutrition Available Tues-Sat via Pager: [phone] 12/03/2018 2:23 PM

## 2018-12-04 ENCOUNTER — Other Ambulatory Visit: Payer: Self-pay | Admitting: Student

## 2018-12-04 DIAGNOSIS — Z1159 Encounter for screening for other viral diseases: Secondary | ICD-10-CM | POA: Diagnosis not present

## 2018-12-04 DIAGNOSIS — Z86718 Personal history of other venous thrombosis and embolism: Secondary | ICD-10-CM | POA: Diagnosis not present

## 2018-12-04 DIAGNOSIS — K219 Gastro-esophageal reflux disease without esophagitis: Secondary | ICD-10-CM | POA: Diagnosis present

## 2018-12-04 DIAGNOSIS — Z886 Allergy status to analgesic agent status: Secondary | ICD-10-CM | POA: Diagnosis not present

## 2018-12-04 DIAGNOSIS — M109 Gout, unspecified: Secondary | ICD-10-CM | POA: Diagnosis present

## 2018-12-04 DIAGNOSIS — F419 Anxiety disorder, unspecified: Secondary | ICD-10-CM | POA: Diagnosis present

## 2018-12-04 DIAGNOSIS — Z7901 Long term (current) use of anticoagulants: Secondary | ICD-10-CM | POA: Diagnosis not present

## 2018-12-04 DIAGNOSIS — E11649 Type 2 diabetes mellitus with hypoglycemia without coma: Secondary | ICD-10-CM | POA: Diagnosis present

## 2018-12-04 DIAGNOSIS — C7931 Secondary malignant neoplasm of brain: Secondary | ICD-10-CM | POA: Diagnosis present

## 2018-12-04 DIAGNOSIS — C3492 Malignant neoplasm of unspecified part of left bronchus or lung: Secondary | ICD-10-CM | POA: Diagnosis present

## 2018-12-04 DIAGNOSIS — E43 Unspecified severe protein-calorie malnutrition: Secondary | ICD-10-CM | POA: Diagnosis present

## 2018-12-04 DIAGNOSIS — Z86711 Personal history of pulmonary embolism: Secondary | ICD-10-CM | POA: Diagnosis not present

## 2018-12-04 DIAGNOSIS — E119 Type 2 diabetes mellitus without complications: Secondary | ICD-10-CM | POA: Diagnosis not present

## 2018-12-04 DIAGNOSIS — Z6825 Body mass index (BMI) 25.0-25.9, adult: Secondary | ICD-10-CM | POA: Diagnosis not present

## 2018-12-04 DIAGNOSIS — R1310 Dysphagia, unspecified: Secondary | ICD-10-CM | POA: Diagnosis present

## 2018-12-04 DIAGNOSIS — Z431 Encounter for attention to gastrostomy: Secondary | ICD-10-CM | POA: Diagnosis present

## 2018-12-04 DIAGNOSIS — J910 Malignant pleural effusion: Secondary | ICD-10-CM | POA: Diagnosis present

## 2018-12-04 DIAGNOSIS — Z66 Do not resuscitate: Secondary | ICD-10-CM | POA: Diagnosis present

## 2018-12-04 DIAGNOSIS — Y833 Surgical operation with formation of external stoma as the cause of abnormal reaction of the patient, or of later complication, without mention of misadventure at the time of the procedure: Secondary | ICD-10-CM | POA: Diagnosis present

## 2018-12-04 DIAGNOSIS — K9423 Gastrostomy malfunction: Secondary | ICD-10-CM | POA: Diagnosis present

## 2018-12-04 DIAGNOSIS — I10 Essential (primary) hypertension: Secondary | ICD-10-CM | POA: Diagnosis present

## 2018-12-04 LAB — CBC
HCT: 38.2 % (ref 36.0–46.0)
Hemoglobin: 12 g/dL (ref 12.0–15.0)
MCH: 26.6 pg (ref 26.0–34.0)
MCHC: 31.4 g/dL (ref 30.0–36.0)
MCV: 84.7 fL (ref 80.0–100.0)
Platelets: 206 10*3/uL (ref 150–400)
RBC: 4.51 MIL/uL (ref 3.87–5.11)
RDW: 15.5 % (ref 11.5–15.5)
WBC: 3.4 10*3/uL — ABNORMAL LOW (ref 4.0–10.5)
nRBC: 0 % (ref 0.0–0.2)

## 2018-12-04 LAB — BASIC METABOLIC PANEL
Anion gap: 12 (ref 5–15)
BUN: 13 mg/dL (ref 8–23)
CO2: 23 mmol/L (ref 22–32)
Calcium: 9.3 mg/dL (ref 8.9–10.3)
Chloride: 102 mmol/L (ref 98–111)
Creatinine, Ser: 0.78 mg/dL (ref 0.44–1.00)
GFR calc Af Amer: >60 mL/min (ref >60)
GFR calc non Af Amer: >60 mL/min (ref >60)
Glucose, Bld: 85 mg/dL (ref 70–99)
Potassium: 4.4 mmol/L (ref 3.5–5.1)
Sodium: 137 mmol/L (ref 135–145)

## 2018-12-04 LAB — GLUCOSE, CAPILLARY
Glucose-Capillary: 102 mg/dL — ABNORMAL HIGH (ref 70–99)
Glucose-Capillary: 108 mg/dL — ABNORMAL HIGH (ref 70–99)
Glucose-Capillary: 111 mg/dL — ABNORMAL HIGH (ref 70–99)
Glucose-Capillary: 117 mg/dL — ABNORMAL HIGH (ref 70–99)
Glucose-Capillary: 129 mg/dL — ABNORMAL HIGH (ref 70–99)
Glucose-Capillary: 76 mg/dL (ref 70–99)
Glucose-Capillary: 85 mg/dL (ref 70–99)
Glucose-Capillary: 90 mg/dL (ref 70–99)

## 2018-12-04 NOTE — Progress Notes (Signed)
Reviewed patient's hospital course.  Would have IR place a gastrostomy tube on 12/05/2018.  I think this would be the best course for the patient.  This was discussed with the patient.  Would continue holding anticoagulation until it is placed.

## 2018-12-04 NOTE — Progress Notes (Signed)
PROGRESS NOTE    Kaitlin [name]  JXB:[id number]  DOB: [birth year]  DOA: 12/02/2018 PCP: [name], MD   Brief Admission Hx: 68 y.o. female,w lung cancer (adenocarcinoma) with brain mets, hypertension, h/o DVT/ PE, hypokalemia, severe protein calorie malnutrition, dysphagia with PEG placement on 09/12/2018 presents with c/o PEG clogged up since Saturday.   MDM/Assessment & Plan:   1. PEG malfunction - appreciate assistance from surgery team, recommending IR replacement on 7/10.   I spoke with IR and they are making arrangements for patient to be transported for procedure 7/10 thru [facility].  2. Severe protein calorie malnutrition - consult to dietitian appreciated.  See orders 3. Hypoglycemia - resolved after we added dextrose to IV fluids.  4. GERD - continue PPI.  5. H/o PE/DVT - temporarily holding lovenox, continue SCDs.  6. Hypertension  - stable, follow.   DVT prophylaxis: SCDs Code Status: DNR  Family Communication: patient at bedside Disposition Plan: pt to go to [facility] [place] [facility] 7/10 for IR PEG placement  Consultants:  Surgery  IR  Procedures:    Antimicrobials:     Subjective: Pt without complaints.    Objective: Vitals:   12/03/18 2008 12/03/18 2135 12/04/18 0500 12/04/18 0608  BP:  106/69  99/63  Pulse:  71  82  Resp:  16    Temp:  97.7 F (36.5 C)  97.7 F (36.5 C)  TempSrc:  Oral  Oral  SpO2: 93% 100%  100%  Weight:   74.3 kg   Height:        Intake/Output Summary (Last 24 hours) at 12/04/2018 0942 Last data filed at 12/04/2018 0300 Gross per 24 hour  Intake 1366.66 ml  Output -  Net 1366.66 ml   Filed Weights   12/02/18 1621 12/03/18 0039 12/04/18 0500  Weight: 71.6 kg 70.9 kg 74.3 kg     REVIEW OF SYSTEMS  As per history otherwise all reviewed and reported negative  Exam:  General exam: awake, alert NAD.  Respiratory system: Clear. No increased work of breathing. Cardiovascular system: S1 & S2 heard. No JVD, murmurs, gallops, clicks or  pedal edema. Gastrointestinal system: Abdomen is nondistended, soft and nontender. Normal bowel sounds heard. Central nervous system: Alert and oriented. No focal neurological deficits. Extremities: no CCE.  Data Reviewed: Basic Metabolic Panel: Recent Labs  Lab 12/01/18 1442 12/04/18 0509  NA 138 137  K 4.1 4.4  CL 104 102  CO2 25 23  GLUCOSE 99 85  BUN 17 13  CREATININE 0.87 0.78  CALCIUM 9.9 9.3  MG 1.9  --    Liver Function Tests: Recent Labs  Lab 12/01/18 1442  AST 25  ALT 41  ALKPHOS 64  BILITOT 0.5  PROT 7.6  ALBUMIN 3.7   No results for input(s): LIPASE, AMYLASE in the last 168 hours. No results for input(s): AMMONIA in the last 168 hours. CBC: Recent Labs  Lab 12/01/18 1442 12/04/18 0509  WBC 5.8 3.4*  NEUTROABS 3.4  --   HGB 13.2 12.0  HCT 42.6 38.2  MCV 84.7 84.7  PLT 270 206   Cardiac Enzymes: No results for input(s): CKTOTAL, CKMB, CKMBINDEX, TROPONINI in the last 168 hours. CBG (last 3)  Recent Labs    12/04/18 0007 12/04/18 0315 12/04/18 0724  GLUCAP 117* 85 129*   Recent Results (from the past 240 hour(s))  SARS Coronavirus 2 (CEPHEID - Performed in [facility] hospital lab), Hosp Order     Status: None   Collection Time: 12/02/18  9:55 PM   Specimen: Nasopharyngeal Swab  Result Value Ref Range Status   SARS Coronavirus 2 NEGATIVE NEGATIVE Final    Comment: (NOTE) If result is NEGATIVE SARS-CoV-2 target nucleic acids are NOT DETECTED. The SARS-CoV-2 RNA is generally detectable in upper and lower  respiratory specimens during the acute phase of infection. The lowest  concentration of SARS-CoV-2 viral copies this assay can detect is 250  copies / mL. A negative result does not preclude SARS-CoV-2 infection  and should not be used as the sole basis for treatment or other  patient management decisions.  A negative result may occur with  improper specimen collection / handling, submission of specimen other  than nasopharyngeal swab,  presence of viral mutation(s) within the  areas targeted by this assay, and inadequate number of viral copies  (<250 copies / mL). A negative result must be combined with clinical  observations, patient history, and epidemiological information. If result is POSITIVE SARS-CoV-2 target nucleic acids are DETECTED. The SARS-CoV-2 RNA is generally detectable in upper and lower  respiratory specimens dur ing the acute phase of infection.  Positive  results are indicative of active infection with SARS-CoV-2.  Clinical  correlation with patient history and other diagnostic information is  necessary to determine patient infection status.  Positive results do  not rule out bacterial infection or co-infection with other viruses. If result is PRESUMPTIVE POSTIVE SARS-CoV-2 nucleic acids MAY BE PRESENT.   A presumptive positive result was obtained on the submitted specimen  and confirmed on repeat testing.  While 2019 novel coronavirus  (SARS-CoV-2) nucleic acids may be present in the submitted sample  additional confirmatory testing may be necessary for epidemiological  and / or clinical management purposes  to differentiate between  SARS-CoV-2 and other Sarbecovirus currently known to infect humans.  If clinically indicated additional testing with an alternate test  methodology [phone]) is advised. The SARS-CoV-2 RNA is generally  detectable in upper and lower respiratory sp ecimens during the acute  phase of infection. The expected result is Negative. Fact Sheet for Patients:  BoilerBrush.com.cy Fact Sheet for Healthcare Providers: https://pope.com/ This test is not yet approved or cleared by the [place] FDA and has been authorized for detection and/or diagnosis of SARS-CoV-2 by FDA under an Emergency Use Authorization (EUA).  This EUA will remain in effect (meaning this test can be used) for the duration of the COVID-19 declaration under  Section 564(b)(1) of the Act, 21 U.S.C. section 360bbb-3(b)(1), unless the authorization is terminated or revoked sooner. Performed at [name], [address], [place], [place] [address]   Surgical pcr screen     Status: None   Collection Time: 12/03/18 12:59 AM   Specimen: Nasal Mucosa; Nasal Swab  Result Value Ref Range Status   MRSA, PCR NEGATIVE NEGATIVE Final   Staphylococcus aureus NEGATIVE NEGATIVE Final    Comment: (NOTE) The Xpert SA Assay (FDA approved for NASAL specimens in patients 89 years of age and older), is one component of a comprehensive surveillance program. It is not intended to diagnose infection nor to guide or monitor treatment. Performed at [facility], [address] [place], [place] [address]      Studies: Ct Abdomen Pelvis W Contrast  Result Date: 12/02/2018 CLINICAL DATA:  Abdominal pain with a feeding tube. EXAM: CT ABDOMEN AND PELVIS WITH CONTRAST TECHNIQUE: Multidetector CT imaging of the abdomen and pelvis was performed using the standard protocol following bolus administration of intravenous contrast. CONTRAST:  OMNIPAQUE IOHEXOL  300 MG/ML  SOLN COMPARISON:  November 03, 2018 FINDINGS: Lower chest: There is extensive airspace consolidation involving the left lower lobe. There is a trace left-sided pleural effusion.The heart size is normal. There is a trace right-sided pleural effusion. There is likely a small pericardial effusion. Hepatobiliary: The liver is normal. Normal gallbladder.There is no biliary ductal dilation. Pancreas: Normal contours without ductal dilatation. No peripancreatic fluid collection. Spleen: No splenic laceration or hematoma. Adrenals/Urinary Tract: --Adrenal glands: There is some mild thickening of the adrenal glands bilaterally. This is stable from prior study. --Right kidney/ureter: No hydronephrosis or perinephric hematoma. --Left kidney/ureter: No hydronephrosis or perinephric hematoma. --Urinary bladder: Unremarkable.  Stomach/Bowel: --Stomach/Duodenum: There is a malposition PEG tube located within the patient's anterior left abdominal wall. There is some surrounding soft tissue swelling without evidence of an abscess. --Small bowel: No dilatation or inflammation. --Colon: Rectosigmoid diverticulosis without acute inflammation. --Appendix: Normal. Vascular/Lymphatic: Normal course and caliber of the major abdominal vessels. There is a circumaortic left renal vein, a normal variant. --No retroperitoneal lymphadenopathy. --No mesenteric lymphadenopathy. --No pelvic or inguinal lymphadenopathy. Reproductive: Status post hysterectomy. No adnexal mass. Other: No ascites or free air. The abdominal wall is normal. Small soft tissue nodules in the low anterior abdominal wall are favored to be secondary to subcutaneous injections at this location. Musculoskeletal. No acute displaced fractures. IMPRESSION: 1. Malpositioned PEG tube, currently located within the anterior abdominal wall. Repositioning is recommended before use. 2. Airspace opacity in left lower lobe may represent treatment changes versus pneumonia. There are trace bilateral pleural effusions. 3. Sigmoid diverticulosis without CT evidence of diverticulitis. Electronically Signed   By: Katherine Mantle M.D.   On: 12/02/2018 21:12     Scheduled Meds: . amLODipine  5 mg Oral Daily  . colchicine  0.6 mg Oral Daily  . feeding supplement (ENSURE ENLIVE)  237 mL Oral 5 X Daily  . insulin aspart  0-9 Units Subcutaneous Q4H  . pantoprazole  40 mg Oral Daily  . potassium chloride  20 mEq Oral BID   Continuous Infusions: . dextrose 5 % and 0.9% NaCl 50 mL/hr at 12/03/18 1820    Principal Problem:   PEG tube malfunction (HCC) Active Problems:   Dysphagia   Hypertension   Diabetes mellitus without complication (HCC)   Adenocarcinoma, lung, left (HCC)   History of pulmonary embolism   Malnutrition of moderate degree   Time spent:   Standley Dakins, MD [facility] 12/04/2018, 9:42 AM    LOS: 0 days  How to contact the [facility] Attending or Consulting provider 7A - 7P or covering provider during after hours 7P -7A, for this patient?  1. Check the care team in [facility] and look for a) attending/consulting [facility] provider listed and b) the [facility] team listed 2. Log into [email or web address] and use [place]'s universal password to access. If you do not have the password, please contact the hospital operator. 3. Locate the [facility] provider you are looking for under [facility] and page to a number that you can be directly reached. 4. If you still have difficulty reaching the provider, please page the [facility] (Director on Call) for the Hospitalists listed on amion for assistance.

## 2018-12-04 NOTE — Progress Notes (Addendum)
Patient is scheduled for gastrostomy tube placement in IR at [facility] 7/10.  She is to arrive at [facility] at 2:30.  Her gastostromy was removed in the [facility] ED several days ago.  Will set up as a new placement if tract cannot be recannulated.   Spoke with Angus Palms, RN.   NPO p MN.  Hold lovenox. --She is on therapeutic lovenox dosing for known DVT/PE.  Will have been held for 24+ hrs for appointment tomorrow.   RN to arrange transport.   Brynda Greathouse, MS RD PA-C 1:32 PM

## 2018-12-05 ENCOUNTER — Other Ambulatory Visit: Payer: Self-pay

## 2018-12-05 ENCOUNTER — Encounter (HOSPITAL_COMMUNITY): Payer: Self-pay | Admitting: Interventional Radiology

## 2018-12-05 ENCOUNTER — Inpatient Hospital Stay (HOSPITAL_COMMUNITY): Admit: 2018-12-05 | Discharge: 2018-12-05 | Disposition: A | Discharge status: Home / Self Care | Payer: Medicare HMO

## 2018-12-05 ENCOUNTER — Ambulatory Visit (HOSPITAL_COMMUNITY)
Admission: RE | Admit: 2018-12-05 | Discharge: 2018-12-05 | Disposition: A | Discharge status: Home / Self Care | Payer: Medicare HMO | Source: Ambulatory Visit | Attending: General Surgery | Admitting: General Surgery

## 2018-12-05 DIAGNOSIS — I10 Essential (primary) hypertension: Secondary | ICD-10-CM

## 2018-12-05 DIAGNOSIS — J45909 Unspecified asthma, uncomplicated: Secondary | ICD-10-CM | POA: Insufficient documentation

## 2018-12-05 DIAGNOSIS — E119 Type 2 diabetes mellitus without complications: Secondary | ICD-10-CM | POA: Insufficient documentation

## 2018-12-05 DIAGNOSIS — Z86711 Personal history of pulmonary embolism: Secondary | ICD-10-CM

## 2018-12-05 DIAGNOSIS — K9423 Gastrostomy malfunction: Secondary | ICD-10-CM | POA: Diagnosis not present

## 2018-12-05 DIAGNOSIS — C3490 Malignant neoplasm of unspecified part of unspecified bronchus or lung: Secondary | ICD-10-CM | POA: Insufficient documentation

## 2018-12-05 DIAGNOSIS — Z86718 Personal history of other venous thrombosis and embolism: Secondary | ICD-10-CM | POA: Insufficient documentation

## 2018-12-05 DIAGNOSIS — Z431 Encounter for attention to gastrostomy: Secondary | ICD-10-CM | POA: Insufficient documentation

## 2018-12-05 DIAGNOSIS — Z79899 Other long term (current) drug therapy: Secondary | ICD-10-CM | POA: Insufficient documentation

## 2018-12-05 HISTORY — PX: IR GASTROSTOMY TUBE MOD SED: IMG625

## 2018-12-05 LAB — GLUCOSE, CAPILLARY
Glucose-Capillary: 80 mg/dL (ref 70–99)
Glucose-Capillary: 75 mg/dL (ref 70–99)
Glucose-Capillary: 72 mg/dL (ref 70–99)
Glucose-Capillary: 83 mg/dL (ref 70–99)

## 2018-12-05 MED ORDER — LIDOCAINE VISCOUS HCL 2 % MT SOLN
OROMUCOSAL | Status: AC
  Administered 2018-12-05: 10 mL
  Filled 2018-12-05: qty 15

## 2018-12-05 MED ORDER — LIDOCAINE HCL 1 % IJ SOLN
INTRAMUSCULAR | Status: AC
  Filled 2018-12-05: qty 20

## 2018-12-05 MED ORDER — FENTANYL CITRATE (PF) 100 MCG/2ML IJ SOLN
INTRAMUSCULAR | Status: AC
  Filled 2018-12-05: qty 2

## 2018-12-05 MED ORDER — MIDAZOLAM HCL 2 MG/2ML IJ SOLN
INTRAMUSCULAR | Status: AC | PRN
  Administered 2018-12-05 (×3): 1 mg via INTRAVENOUS

## 2018-12-05 MED ORDER — IOHEXOL 300 MG/ML  SOLN
50.0000 mL | Freq: Once | INTRAMUSCULAR | Status: AC | PRN
  Administered 2018-12-05: 10 mL via ORAL

## 2018-12-05 MED ORDER — LIDOCAINE HCL (PF) 1 % IJ SOLN
INTRAMUSCULAR | Status: AC | PRN
  Administered 2018-12-05: 10 mL

## 2018-12-05 MED ORDER — FENTANYL CITRATE (PF) 100 MCG/2ML IJ SOLN
INTRAMUSCULAR | Status: AC | PRN
  Administered 2018-12-05 (×2): 50 ug via INTRAVENOUS

## 2018-12-05 MED ORDER — SODIUM CHLORIDE 0.9 % IV SOLN: INTRAVENOUS | Status: DC

## 2018-12-05 MED ORDER — MIDAZOLAM HCL 2 MG/2ML IJ SOLN
INTRAMUSCULAR | Status: AC
  Filled 2018-12-05: qty 2

## 2018-12-05 MED ORDER — ENOXAPARIN SODIUM 150 MG/ML ~~LOC~~ SOLN: 150.0000 mg | SUBCUTANEOUS | Status: DC

## 2018-12-05 NOTE — Care Management Important Message (Signed)
Important Message  Patient Details  Name: Kaitlin [name] MRN: [id number] Date of Birth: June 28, 1950   Medicare Important Message Given:  Yes     Tommy Medal 12/05/2018, 1:01 PM

## 2018-12-05 NOTE — TOC Progression Note (Signed)
Transition of Care [facility]) - Progression Note    Patient Details  Name: Kaitlin [name] MRN: [id number] Date of Birth: 1951/01/15  Transition of Care [facility]) CM/SW Contact  Boneta Lucks, RN Phone Number: 12/05/2018, 2:31 PM  Clinical Narrative:   Patient is yellow on readmission, TOC following chart, patient did not go to IR yesterday for PEG tube placement, scheduled for today, then should discharge home per MD.  Patient lives with her daughter. No Needs identified.     Expected Discharge Plan: Home/Self Care Barriers to Discharge: Continued Medical Work up  Expected Discharge Plan and Services Expected Discharge Plan: Home/Self Care             Readmission Risk Interventions No flowsheet data found.

## 2018-12-05 NOTE — H&P (Signed)
Referring Physician(s): Virl Cagey  Supervising Physician: Jacqulynn Cadet  Patient Status:  AP IP  Chief Complaint:  Dislodged gastrostomy tube  Subjective: Patient familiar to IR service from left lung mass biopsy on 06/18/2018 and right neck mass biopsy on 07/11/2018.  She has a history of metastatic lung cancer and poor p.o. intake, status post gastrostomy tube by Dr. Constance Haw in April of this year.  She recently had dislodgment of the tube and failed attempts at bedside replacement.  CT scan revealed malposition of tube with tip in anterior abdominal wall.  Tube has since been removed.  She presents today for attempt at gastrostomy tube replacement versus new placement.  She denies fever, headache, chest pain, dyspnea, cough, abdominal pain, back pain, nausea, vomiting or bleeding.  She does have some right shoulder discomfort.  Additional medical history as below.  Past Medical History:  Diagnosis Date  . Asthma   . Cancer ([place]) 04/21/2018   Lung Cancer   . Carpal tunnel syndrome   . Cataract    left eye  . Diabetes mellitus without complication ([place])   . DVT (deep venous thrombosis) ([place])   . Hypertension   . Pneumonia 04/21/2018  . Pulmonary embolism ([place]) 03/2018   Past Surgical History:  Procedure Laterality Date  . ABDOMINAL HYSTERECTOMY    . biopsy of right breast    . ESOPHAGOGASTRODUODENOSCOPY (EGD) WITH PROPOFOL N/A 09/12/2018   Procedure: ESOPHAGOGASTRODUODENOSCOPY (EGD) WITH PROPOFOL (scope in 1017, scope out 1032);  Surgeon: Virl Cagey, MD;  Location: [facility];  Service: General;  Laterality: N/A;  . FOOT SURGERY    . implant left eye    . INTRAOCULAR LENS INSERTION    . PEG PLACEMENT N/A 09/12/2018   Procedure: PERCUTANEOUS ENDOSCOPIC GASTROSTOMY (PEG) PLACEMENT;  Surgeon: Virl Cagey, MD;  Location: [facility];  Service: General;  Laterality: N/A;      Allergies: Banana and Aspirin  Medications: Prior to Admission medications    Medication Sig Start Date End Date Taking? Authorizing Provider  acetaminophen (TYLENOL) 325 MG tablet Take 2 tablets (650 mg total) by mouth every 6 (six) hours as needed for mild pain (or Fever >/= 101). 09/13/18   Barton Dubois, MD  albuterol (PROVENTIL HFA;VENTOLIN HFA) 108 (90 BASE) MCG/ACT inhaler Inhale 2 puffs into the lungs every 6 (six) hours as needed for wheezing.    [provider]  albuterol (PROVENTIL) (2.5 MG/3ML) 0.083% nebulizer solution Take 3 mLs (2.5 mg total) by nebulization every 6 (six) hours as needed for wheezing or shortness of breath. 04/29/18   Caren Griffins, MD  amLODipine (NORVASC) 5 MG tablet Take 5 mg by mouth daily.    [provider]  colchicine 0.6 MG tablet Take 1 tablet (0.6 mg total) by mouth daily. 09/09/18   Derek Jack, MD  enoxaparin (LOVENOX) 150 MG/ML injection Inject 150 mg into the skin daily.  08/11/18   [provider]  ibuprofen (ADVIL) 200 MG tablet Take 200 mg by mouth every 6 (six) hours as needed for mild pain or moderate pain.    [provider]  lactose free nutrition (BOOST) LIQD Take 237 mLs by mouth 4 (four) times daily.    [provider]  LORazepam (ATIVAN) 0.5 MG tablet Take 0.5 mg by mouth at bedtime as needed for anxiety.    [provider]  metoCLOPramide (REGLAN) 10 MG tablet Take 1 tablet (10 mg total) by mouth every 8 (eight) hours as needed for refractory  nausea / vomiting. Patient taking differently: Take 10 mg by mouth daily as needed for nausea or vomiting.  09/11/18   Mesner, Corene Cornea, MD  Nutritional Supplements (FEEDING SUPPLEMENT, OSMOLITE 1.5 CAL,) LIQD Place 240 mLs into feeding tube 5 (five) times daily. Patient taking differently: Place 240 mLs into feeding tube See admin instructions. *May use three to four times daily. Drinks Boost 4 times daily 09/13/18 12/12/18  Barton Dubois, MD  ondansetron (ZOFRAN ODT) 4 MG disintegrating tablet Take 1 tablet (4 mg total) by  mouth every 8 (eight) hours as needed for nausea or vomiting. 09/04/18   Derek Jack, MD  osimertinib mesylate (TAGRISSO) 40 MG tablet Take 1 tablet (40 mg total) by mouth daily. 11/25/18   Derek Jack, MD  pantoprazole (PROTONIX) 40 MG tablet TAKE 1 TABLET BY MOUTH ONCE DAILY. Patient taking differently: Take 40 mg by mouth daily.  10/21/18   Lockamy, Randi L, NP-C  potassium chloride (K-DUR) 10 MEQ tablet Take 2 tablets (20 mEq total) by mouth 2 (two) times daily. 09/09/18   Derek Jack, MD  prochlorperazine (COMPAZINE) 10 MG tablet TAKE (1) TABLET BY MOUTH EVERY SIX HOURS AS NEEDED FOR NAUSEA OR VOMITING. Patient taking differently: Take 10 mg by mouth every 6 (six) hours as needed for nausea or vomiting.  10/09/18   Glennie Isle, NP-C  Water For Irrigation, Sterile (FREE WATER) SOLN Place 200 mLs into feeding tube every 6 (six) hours. 09/13/18   Barton Dubois, MD  zolpidem (AMBIEN CR) 6.25 MG CR tablet Take 1 tablet (6.25 mg total) by mouth at bedtime as needed for sleep. Patient taking differently: Take 6.25 mg by mouth at bedtime.  10/16/18   Derek Jack, MD     Vital Signs: BP (!) 146/93 (BP Location: Left Arm)   Pulse (!) 104   Temp 98.4 F (36.9 C) (Oral)   Resp 18   SpO2 96%   Physical Exam awake, alert.  Chest with diminished breath sounds bases.  Heart with regular rate and rhythm.  Abdomen soft, positive bowel sounds, slightly tender around previous G-tube insertion site.  No drainage of the area.  No significant lower extremity edema.  Imaging: Ct Abdomen Pelvis W Contrast  Result Date: 12/02/2018 CLINICAL DATA:  Abdominal pain with a feeding tube. EXAM: CT ABDOMEN AND PELVIS WITH CONTRAST TECHNIQUE: Multidetector CT imaging of the abdomen and pelvis was performed using the standard protocol following bolus administration of intravenous contrast. CONTRAST:  141mL OMNIPAQUE IOHEXOL 300 MG/ML  SOLN COMPARISON:  November 03, 2018 FINDINGS: Lower chest:  There is extensive airspace consolidation involving the left lower lobe. There is a trace left-sided pleural effusion.The heart size is normal. There is a trace right-sided pleural effusion. There is likely a small pericardial effusion. Hepatobiliary: The liver is normal. Normal gallbladder.There is no biliary ductal dilation. Pancreas: Normal contours without ductal dilatation. No peripancreatic fluid collection. Spleen: No splenic laceration or hematoma. Adrenals/Urinary Tract: --Adrenal glands: There is some mild thickening of the adrenal glands bilaterally. This is stable from prior study. --Right kidney/ureter: No hydronephrosis or perinephric hematoma. --Left kidney/ureter: No hydronephrosis or perinephric hematoma. --Urinary bladder: Unremarkable. Stomach/Bowel: --Stomach/Duodenum: There is a malposition PEG tube located within the patient's anterior left abdominal wall. There is some surrounding soft tissue swelling without evidence of an abscess. --Small bowel: No dilatation or inflammation. --Colon: Rectosigmoid diverticulosis without acute inflammation. --Appendix: Normal. Vascular/Lymphatic: Normal course and caliber of the major abdominal vessels. There is a circumaortic left renal vein, a normal variant. --  No retroperitoneal lymphadenopathy. --No mesenteric lymphadenopathy. --No pelvic or inguinal lymphadenopathy. Reproductive: Status post hysterectomy. No adnexal mass. Other: No ascites or free air. The abdominal wall is normal. Small soft tissue nodules in the low anterior abdominal wall are favored to be secondary to subcutaneous injections at this location. Musculoskeletal. No acute displaced fractures. IMPRESSION: 1. Malpositioned PEG tube, currently located within the anterior abdominal wall. Repositioning is recommended before use. 2. Airspace opacity in left lower lobe may represent treatment changes versus pneumonia. There are trace bilateral pleural effusions. 3. Sigmoid diverticulosis  without CT evidence of diverticulitis. Electronically Signed   By: Constance Holster M.D.   On: 12/02/2018 21:12    Labs:  CBC: Recent Labs    10/16/18 1337 11/03/18 1408 12/01/18 1442 12/04/18 0509  WBC 6.8 7.3 5.8 3.4*  HGB 12.5 12.1 13.2 12.0  HCT 39.5 38.6 42.6 38.2  PLT 353 387 270 206    COAGS: Recent Labs    06/18/18 0438  07/08/18 0353 07/08/18 1051 07/08/18 2051 07/09/18 1028 07/09/18 1637 12/03/18 1406  INR 1.21  --   --   --   --   --  1.25 1.1  APTT 80*   < > 63* 65* 67* 81*  --   --    < > = values in this interval not displayed.    BMP: Recent Labs    10/16/18 1337 11/03/18 1408 12/01/18 1442 12/04/18 0509  NA 134* 133* 138 137  K 3.8 4.5 4.1 4.4  CL 98 99 104 102  CO2 24 20* 25 23  GLUCOSE 120* 107* 99 85  BUN 20 17 17 13   CALCIUM 9.5 9.8 9.9 9.3  CREATININE 0.77 0.80 0.87 0.78  GFRNONAA >60 >60 >60 >60  GFRAA >60 >60 >60 >60    LIVER FUNCTION TESTS: Recent Labs    10/01/18 1111 10/16/18 1337 11/03/18 1408 12/01/18 1442  BILITOT 0.2* 0.3 0.5 0.5  AST 24 23 18 25   ALT 23 23 24  41  ALKPHOS 73 70 59 64  PROT 7.2 7.9 7.7 7.6  ALBUMIN 3.2* 3.5 3.5 3.7    Assessment and Plan: Pt with history of metastatic lung cancer and poor p.o. intake, status post gastrostomy tube placement by Dr. Constance Haw in April of this year.  She recently had dislodgment of the tube and failed attempts at bedside replacement.  CT scan revealed malposition of tube with tip in anterior abdominal wall.  Tube has since been removed.  She presents today for attempt at gastrostomy tube replacement versus new placement.  Details/risks of procedure, including but not limited to, internal bleeding, infection, injury to adjacent structures discussed with patient with her understanding and consent. COVID neg.   Electronically Signed: D. Rowe Robert, PA-C 12/05/2018, 2:59 PM   I spent a total of 25 minutes at the the patient's bedside AND on the patient's hospital floor  or unit, greater than 50% of which was counseling/coordinating care for gastrostomy tube replacement versus new placement

## 2018-12-05 NOTE — Progress Notes (Signed)
Patient's IV removed intact. Patient discharged home. All discharge information given, all questions answered.

## 2018-12-05 NOTE — Discharge Instructions (Signed)
Gastrostomy Tube Replacement, Care After This sheet gives you information about how to care for yourself after your procedure. Your health care provider may also give you more specific instructions. If you have problems or questions, contact your health care provider. What can I expect after the procedure? After the procedure, it is common to have:  Mild pain in your abdomen.  A small amount of blood-tinged fluid leaking from the replacement site. Follow these instructions at home:   You may resume your normal level of activity.  You may resume your normal feedings.  Wash your hands before and after caring for your gastrostomy tube.  Check the skin around the tube site insertion for redness, a rash, swelling, drainage, or extra tissue growth. If you notice any of these, call your health care provider.  Care for your gastrostomy tube as you did before, or as directed by your health care provider. Contact a health care provider if:  You have a fever or chills.  You have redness or irritation near the insertion site.  You continue to have pain in the abdomen or leaking around your gastrostomy tube. Get help right away if:  You develop bleeding or a lot of discharge around the tube.  You have severe pain in the abdomen.  Your new tube is not working properly.  You are unable to get feedings into the tube.  Your tube comes out for any reason. Summary  Follow specific instructions as told by your health care provider. If you have problems or questions, contact your health care provider.  After the procedure you may resume your normal level of activity and normal feedings.  Care for your gastrostomy tube as you did before, or as directed by your health care provider. This information is not intended to replace advice given to you by your health care provider. Make sure you discuss any questions you have with your health care provider. Document Released: 12/09/2013  Document Revised: 06/19/2017 Document Reviewed: 06/19/2017 Elsevier Patient Education  [place]. Moderate Conscious Sedation, Adult, Care After These instructions provide you with information about caring for yourself after your procedure. Your health care provider may also give you more specific instructions. Your treatment has been planned according to current medical practices, but problems sometimes occur. Call your health care provider if you have any problems or questions after your procedure. What can I expect after the procedure? After your procedure, it is common:  To feel sleepy for several hours.  To feel clumsy and have poor balance for several hours.  To have poor judgment for several hours.  To vomit if you eat too soon. Follow these instructions at home: For at least 24 hours after the procedure:   Do not: ? Participate in activities where you could fall or become injured. ? Drive. ? Use heavy machinery. ? Drink alcohol. ? Take sleeping pills or medicines that cause drowsiness. ? Make important decisions or sign legal documents. ? Take care of children on your own.  Rest. Eating and drinking  Follow the diet recommended by your health care provider.  If you vomit: ? Drink water, juice, or soup when you can drink without vomiting. ? Make sure you have little or no nausea before eating solid foods. General instructions  Have a responsible adult stay with you until you are awake and alert.  Take over-the-counter and prescription medicines only as told by your health care provider.  If you smoke, do not smoke without  supervision.  Keep all follow-up visits as told by your health care provider. This is important. Contact a health care provider if:  You keep feeling nauseous or you keep vomiting.  You feel light-headed.  You develop a rash.  You have a fever. Get help right away if:  You have trouble breathing. This information is not  intended to replace advice given to you by your health care provider. Make sure you discuss any questions you have with your health care provider. Document Released: 03/04/2013 Document Revised: 04/26/2017 Document Reviewed: 09/03/2015 Elsevier Patient Education  2020 [employer].

## 2018-12-05 NOTE — Progress Notes (Signed)
PROGRESS NOTE    Kaitlin [name]  WUJ:[id number]  DOB: 1951/05/23  DOA: 12/02/2018 PCP: [name], MD   Brief Admission Hx: 68 y.o. female,w lung cancer (adenocarcinoma) with brain mets, hypertension, h/o DVT/ PE, hypokalemia, severe protein calorie malnutrition, dysphagia with PEG placement on 09/12/2018 presents with c/o PEG clogged up since Saturday.   MDM/Assessment & Plan:   1. PEG malfunction - appreciate assistance from surgery team, Pt to go to [facility] for IR replacement today.   I spoke with IR and they are making arrangements with the bedside RN for patient to be transported for procedure 7/10 thru [facility].  2. Severe protein calorie malnutrition - consult to dietitian appreciated.  See orders 3. Hypoglycemia - resolved after we added dextrose to IV fluids.  4. GERD - continue PPI.  5. H/o PE/DVT - temporarily holding lovenox, continue SCDs. Resume post-PEG placement.  6. Hypertension  - stable, follow.   DVT prophylaxis: SCDs Code Status: DNR  Family Communication: patient at bedside Disposition Plan: pt to go to [facility] 7/10 for IR PEG placement, possibly home later today  Consultants:  Surgery  IR  Procedures:    Antimicrobials:     Subjective: Pt without complaints.    Objective: Vitals:   12/04/18 2119 12/05/18 0425 12/05/18 0457 12/05/18 1247  BP: 116/74 121/73  111/89  Pulse: 79 76  (!) 102  Resp: 18 19  18   Temp: 98.2 F (36.8 C) 98.5 F (36.9 C)  98.4 F (36.9 C)  TempSrc: Oral   Oral  SpO2: 98% 98%  95%  Weight:   74.6 kg   Height:        Intake/Output Summary (Last 24 hours) at 12/05/2018 1417 Last data filed at 12/05/2018 1300 Gross per 24 hour  Intake 240 ml  Output -  Net 240 ml   Filed Weights   12/03/18 0039 12/04/18 0500 12/05/18 0457  Weight: 70.9 kg 74.3 kg 74.6 kg     REVIEW OF SYSTEMS  As per history otherwise all reviewed and reported negative  Exam:  General exam: awake, alert NAD.  Respiratory system: Clear. No  increased work of breathing. Cardiovascular system: S1 & S2 heard. No JVD, murmurs, gallops, clicks or pedal edema. Gastrointestinal system: Abdomen is nondistended, soft and nontender. Normal bowel sounds heard. Central nervous system: Alert and oriented. No focal neurological deficits. Extremities: no CCE.  Data Reviewed: Basic Metabolic Panel: Recent Labs  Lab 12/01/18 1442 12/04/18 0509  NA 138 137  K 4.1 4.4  CL 104 102  CO2 25 23  GLUCOSE 99 85  BUN 17 13  CREATININE 0.87 0.78  CALCIUM 9.9 9.3  MG 1.9  --    Liver Function Tests: Recent Labs  Lab 12/01/18 1442  AST 25  ALT 41  ALKPHOS 64  BILITOT 0.5  PROT 7.6  ALBUMIN 3.7   No results for input(s): LIPASE, AMYLASE in the last 168 hours. No results for input(s): AMMONIA in the last 168 hours. CBC: Recent Labs  Lab 12/01/18 1442 12/04/18 0509  WBC 5.8 3.4*  NEUTROABS 3.4  --   HGB 13.2 12.0  HCT 42.6 38.2  MCV 84.7 84.7  PLT 270 206   Cardiac Enzymes: No results for input(s): CKTOTAL, CKMB, CKMBINDEX, TROPONINI in the last 168 hours. CBG (last 3)  Recent Labs    12/05/18 0404 12/05/18 0730 12/05/18 1145  GLUCAP 80 75 72   Recent Results (from the past 240 hour(s))  SARS Coronavirus 2 (CEPHEID - Performed  in [facility] hospital lab), Hosp Order     Status: None   Collection Time: 12/02/18  9:55 PM   Specimen: Nasopharyngeal Swab  Result Value Ref Range Status   SARS Coronavirus 2 NEGATIVE NEGATIVE Final    Comment: (NOTE) If result is NEGATIVE SARS-CoV-2 target nucleic acids are NOT DETECTED. The SARS-CoV-2 RNA is generally detectable in upper and lower  respiratory specimens during the acute phase of infection. The lowest  concentration of SARS-CoV-2 viral copies this assay can detect is 250  copies / mL. A negative result does not preclude SARS-CoV-2 infection  and should not be used as the sole basis for treatment or other  patient management decisions.  A negative result may occur with   improper specimen collection / handling, submission of specimen other  than nasopharyngeal swab, presence of viral mutation(s) within the  areas targeted by this assay, and inadequate number of viral copies  (<250 copies / mL). A negative result must be combined with clinical  observations, patient history, and epidemiological information. If result is POSITIVE SARS-CoV-2 target nucleic acids are DETECTED. The SARS-CoV-2 RNA is generally detectable in upper and lower  respiratory specimens dur ing the acute phase of infection.  Positive  results are indicative of active infection with SARS-CoV-2.  Clinical  correlation with patient history and other diagnostic information is  necessary to determine patient infection status.  Positive results do  not rule out bacterial infection or co-infection with other viruses. If result is PRESUMPTIVE POSTIVE SARS-CoV-2 nucleic acids MAY BE PRESENT.   A presumptive positive result was obtained on the submitted specimen  and confirmed on repeat testing.  While 2019 novel coronavirus  (SARS-CoV-2) nucleic acids may be present in the submitted sample  additional confirmatory testing may be necessary for epidemiological  and / or clinical management purposes  to differentiate between  SARS-CoV-2 and other Sarbecovirus currently known to infect humans.  If clinically indicated additional testing with an alternate test  methodology [phone]) is advised. The SARS-CoV-2 RNA is generally  detectable in upper and lower respiratory sp ecimens during the acute  phase of infection. The expected result is Negative. Fact Sheet for Patients:  BoilerBrush.com.cy Fact Sheet for Healthcare Providers: https://pope.com/ This test is not yet approved or cleared by the [place] FDA and has been authorized for detection and/or diagnosis of SARS-CoV-2 by FDA under an Emergency Use Authorization (EUA).  This EUA will  remain in effect (meaning this test can be used) for the duration of the COVID-19 declaration under Section 564(b)(1) of the Act, 21 U.S.C. section 360bbb-3(b)(1), unless the authorization is terminated or revoked sooner. Performed at [facility], [address] [place], [place] [address]   Surgical pcr screen     Status: None   Collection Time: 12/03/18 12:59 AM   Specimen: Nasal Mucosa; Nasal Swab  Result Value Ref Range Status   MRSA, PCR NEGATIVE NEGATIVE Final   Staphylococcus aureus NEGATIVE NEGATIVE Final    Comment: (NOTE) The Xpert SA Assay (FDA approved for NASAL specimens in patients 21 years of age and older), is one component of a comprehensive surveillance program. It is not intended to diagnose infection nor to guide or monitor treatment. Performed at [facility], [address], [place], [place] [address]      Studies: No results found.   Scheduled Meds: . amLODipine  5 mg Oral Daily  . colchicine  0.6 mg Oral Daily  . feeding supplement (ENSURE ENLIVE)  237 mL Oral  5 X Daily  . insulin aspart  0-9 Units Subcutaneous Q4H  . pantoprazole  40 mg Oral Daily  . potassium chloride  20 mEq Oral BID   Continuous Infusions: . dextrose 5 % and 0.9% NaCl 50 mL/hr at 12/05/18 2952    Principal Problem:   PEG tube malfunction (HCC) Active Problems:   Dysphagia   Hypertension   Diabetes mellitus without complication (HCC)   Adenocarcinoma, lung, left (HCC)   History of pulmonary embolism   Malnutrition of moderate degree   Time spent:   Standley Dakins, MD [facility] 12/05/2018, 2:17 PM    LOS: 1 day  How to contact the [facility] Attending or Consulting provider 7A - 7P or covering provider during after hours 7P -7A, for this patient?  1. Check the care team in [facility] and look for a) attending/consulting [facility] provider listed and b) the [facility] team listed 2. Log into [email or web address] and use [place]'s universal password to access. If you do not have the  password, please contact the hospital operator. 3. Locate the [facility] provider you are looking for under [facility] and page to a number that you can be directly reached. 4. If you still have difficulty reaching the provider, please page the [facility] (Director on Call) for the Hospitalists listed on amion for assistance.

## 2018-12-05 NOTE — Discharge Summary (Addendum)
Physician Discharge Summary  Kaitlin [name]:[id number] DOB: [birth year] DOA: 12/02/2018  PCP: [name], MD Oncology: Ellin Saba  Admit date: 12/02/2018 Discharge date: 12/05/2018  Admitted From: Home Disposition: Home   Recommendations for Outpatient Follow-up:  Follow up with Dr Ellin Saba in 1 weeks Follow up with PCP    Discharge Condition: STABLE   CODE STATUS: DNR    Brief Hospitalization Summary: Please see all hospital notes, images, labs for full details of the hospitalization. Dr. Elmyra Ricks HPI:  Kaitlin [name]  is a 68 y.o. female,w lung cancer (adenocarcinoma) with brain mets, hypertension, h/o DVT/ PE, hypokalemia, severe protein calorie malnutrition, dysphagia with PEG placement on 09/12/2018 presents with c/o PEG clogged up since Saturday.     In ED,  T 97  P 104  R 19  Bp 126/93  Pox 100% on RA   covid 19 pending   ED took out PEG and tried to insert foley but was unable.  Surgery consulted by ED who requested admission over nite   Pt will be admitted for PEG malfunction.   Brief Admission Hx: 68 y.o. female,w lung cancer (adenocarcinoma) with brain mets, hypertension, h/o DVT/ PE, hypokalemia, severe protein calorie malnutrition, dysphagia with PEG placement on 09/12/2018 presents with c/o PEG clogged up since Saturday.    MDM/Assessment & Plan:   PEG malfunction - appreciate assistance from surgery team, Pt transferred to [facility] for IR replacement today.  Pt tolerated procedure well with no known complications.   Severe protein calorie malnutrition - consult to dietitian appreciated.  See orders Hypoglycemia - resolved after we added dextrose to IV fluids. Resume tube feeds now.  GERD - continue PPI.  H/o PE/DVT - temporarily holding lovenox, continue SCDs. Resume post-PEG placement.  Hypertension  - stable.    DVT prophylaxis: SCDs Code Status: DNR  Family Communication: patient at bedside Disposition Plan: Home    Consultants: Surgery IR   Discharge  Diagnoses:    Discharge Instructions: Discharge Instructions     Call MD for:  difficulty breathing, headache or visual disturbances   Complete by: As directed    Call MD for:  extreme fatigue   Complete by: As directed    Call MD for:  persistant dizziness or light-headedness   Complete by: As directed    Call MD for:  persistant nausea and vomiting   Complete by: As directed    Call MD for:  severe uncontrolled pain   Complete by: As directed    Increase activity slowly   Complete by: As directed       Allergies as of 12/05/2018       Reactions   Banana Anaphylaxis   Aspirin Other (See Comments)   G.I. Upset         Medication List     TAKE these medications    acetaminophen 325 MG tablet Commonly known as: TYLENOL Take 2 tablets (650 mg total) by mouth every 6 (six) hours as needed for mild pain (or Fever >/= 101).   albuterol (2.5 MG/3ML) 0.083% nebulizer solution Commonly known as: PROVENTIL Take 3 mLs (2.5 mg total) by nebulization every 6 (six) hours as needed for wheezing or shortness of breath.   albuterol 108 (90 Base) MCG/ACT inhaler Commonly known as: VENTOLIN HFA Inhale 2 puffs into the lungs every 6 (six) hours as needed for wheezing.   amLODipine 5 MG tablet Commonly known as: NORVASC Take 5 mg by mouth daily.   colchicine 0.6 MG tablet Take 1 tablet (  0.6 mg total) by mouth daily.   enoxaparin 150 MG/ML injection Commonly known as: LOVENOX Inject 150 mg into the skin daily.   lactose free nutrition Liqd Take 237 mLs by mouth 4 (four) times daily. What changed: Another medication with the same name was changed. Make sure you understand how and when to take each.   feeding supplement (OSMOLITE 1.5 CAL) Liqd Place 240 mLs into feeding tube 5 (five) times daily. What changed:  when to take this additional instructions   free water Soln Place 200 mLs into feeding tube every 6 (six) hours.   ibuprofen 200 MG tablet Commonly known as:  ADVIL Take 200 mg by mouth every 6 (six) hours as needed for mild pain or moderate pain.   LORazepam 0.5 MG tablet Commonly known as: ATIVAN Take 0.5 mg by mouth at bedtime as needed for anxiety.   metoCLOPramide 10 MG tablet Commonly known as: REGLAN Take 1 tablet (10 mg total) by mouth every 8 (eight) hours as needed for refractory nausea / vomiting. What changed:  when to take this reasons to take this   ondansetron 4 MG disintegrating tablet Commonly known as: Zofran ODT Take 1 tablet (4 mg total) by mouth every 8 (eight) hours as needed for nausea or vomiting.   osimertinib mesylate 40 MG tablet Commonly known as: Tagrisso Take 1 tablet (40 mg total) by mouth daily.   pantoprazole 40 MG tablet Commonly known as: PROTONIX TAKE 1 TABLET BY MOUTH ONCE DAILY.   potassium chloride 10 MEQ tablet Commonly known as: K-DUR Take 2 tablets (20 mEq total) by mouth 2 (two) times daily.   prochlorperazine 10 MG tablet Commonly known as: COMPAZINE TAKE (1) TABLET BY MOUTH EVERY SIX HOURS AS NEEDED FOR NAUSEA OR VOMITING. What changed: See the new instructions.   zolpidem 6.25 MG CR tablet Commonly known as: Ambien CR Take 1 tablet (6.25 mg total) by mouth at bedtime as needed for sleep. What changed: when to take this       Follow-up Information     Doreatha Massed, MD. Schedule an appointment as soon as possible for a visit in 1 week(s).   Specialty: Hematology Contact information: [address] [place] [address] [phone]         [name], MD. Schedule an appointment as soon as possible for a visit in 1 week(s).   Specialty: Internal Medicine Contact information: [address] [place] [address] [phone]           Allergies  Allergen Reactions   Banana Anaphylaxis   Aspirin Other (See Comments)    G.I. Upset    Allergies as of 12/05/2018       Reactions   Banana Anaphylaxis   Aspirin Other (See Comments)   G.I. Upset          Medication List     TAKE these medications    acetaminophen 325 MG tablet Commonly known as: TYLENOL Take 2 tablets (650 mg total) by mouth every 6 (six) hours as needed for mild pain (or Fever >/= 101).   albuterol (2.5 MG/3ML) 0.083% nebulizer solution Commonly known as: PROVENTIL Take 3 mLs (2.5 mg total) by nebulization every 6 (six) hours as needed for wheezing or shortness of breath.   albuterol 108 (90 Base) MCG/ACT inhaler Commonly known as: VENTOLIN HFA Inhale 2 puffs into the lungs every 6 (six) hours as needed for wheezing.   amLODipine 5 MG tablet Commonly known as: NORVASC Take 5  mg by mouth daily.   colchicine 0.6 MG tablet Take 1 tablet (0.6 mg total) by mouth daily.   enoxaparin 150 MG/ML injection Commonly known as: LOVENOX Inject 150 mg into the skin daily.   lactose free nutrition Liqd Take 237 mLs by mouth 4 (four) times daily. What changed: Another medication with the same name was changed. Make sure you understand how and when to take each.   feeding supplement (OSMOLITE 1.5 CAL) Liqd Place 240 mLs into feeding tube 5 (five) times daily. What changed:  when to take this additional instructions   free water Soln Place 200 mLs into feeding tube every 6 (six) hours.   ibuprofen 200 MG tablet Commonly known as: ADVIL Take 200 mg by mouth every 6 (six) hours as needed for mild pain or moderate pain.   LORazepam 0.5 MG tablet Commonly known as: ATIVAN Take 0.5 mg by mouth at bedtime as needed for anxiety.   metoCLOPramide 10 MG tablet Commonly known as: REGLAN Take 1 tablet (10 mg total) by mouth every 8 (eight) hours as needed for refractory nausea / vomiting. What changed:  when to take this reasons to take this   ondansetron 4 MG disintegrating tablet Commonly known as: Zofran ODT Take 1 tablet (4 mg total) by mouth every 8 (eight) hours as needed for nausea or vomiting.   osimertinib mesylate 40 MG tablet Commonly known as:  Tagrisso Take 1 tablet (40 mg total) by mouth daily.   pantoprazole 40 MG tablet Commonly known as: PROTONIX TAKE 1 TABLET BY MOUTH ONCE DAILY.   potassium chloride 10 MEQ tablet Commonly known as: K-DUR Take 2 tablets (20 mEq total) by mouth 2 (two) times daily.   prochlorperazine 10 MG tablet Commonly known as: COMPAZINE TAKE (1) TABLET BY MOUTH EVERY SIX HOURS AS NEEDED FOR NAUSEA OR VOMITING. What changed: See the new instructions.   zolpidem 6.25 MG CR tablet Commonly known as: Ambien CR Take 1 tablet (6.25 mg total) by mouth at bedtime as needed for sleep. What changed: when to take this        Procedures/Studies: Ct Abdomen Pelvis W Contrast  Result Date: 12/02/2018 CLINICAL DATA:  Abdominal pain with a feeding tube. EXAM: CT ABDOMEN AND PELVIS WITH CONTRAST TECHNIQUE: Multidetector CT imaging of the abdomen and pelvis was performed using the standard protocol following bolus administration of intravenous contrast. CONTRAST:  OMNIPAQUE IOHEXOL 300 MG/ML  SOLN COMPARISON:  November 03, 2018 FINDINGS: Lower chest: There is extensive airspace consolidation involving the left lower lobe. There is a trace left-sided pleural effusion.The heart size is normal. There is a trace right-sided pleural effusion. There is likely a small pericardial effusion. Hepatobiliary: The liver is normal. Normal gallbladder.There is no biliary ductal dilation. Pancreas: Normal contours without ductal dilatation. No peripancreatic fluid collection. Spleen: No splenic laceration or hematoma. Adrenals/Urinary Tract: --Adrenal glands: There is some mild thickening of the adrenal glands bilaterally. This is stable from prior study. --Right kidney/ureter: No hydronephrosis or perinephric hematoma. --Left kidney/ureter: No hydronephrosis or perinephric hematoma. --Urinary bladder: Unremarkable. Stomach/Bowel: --Stomach/Duodenum: There is a malposition PEG tube located within the patient's anterior left abdominal  wall. There is some surrounding soft tissue swelling without evidence of an abscess. --Small bowel: No dilatation or inflammation. --Colon: Rectosigmoid diverticulosis without acute inflammation. --Appendix: Normal. Vascular/Lymphatic: Normal course and caliber of the major abdominal vessels. There is a circumaortic left renal vein, a normal variant. --No retroperitoneal lymphadenopathy. --No mesenteric lymphadenopathy. --No pelvic or inguinal lymphadenopathy. Reproductive:  Status post hysterectomy. No adnexal mass. Other: No ascites or free air. The abdominal wall is normal. Small soft tissue nodules in the low anterior abdominal wall are favored to be secondary to subcutaneous injections at this location. Musculoskeletal. No acute displaced fractures. IMPRESSION: 1. Malpositioned PEG tube, currently located within the anterior abdominal wall. Repositioning is recommended before use. 2. Airspace opacity in left lower lobe may represent treatment changes versus pneumonia. There are trace bilateral pleural effusions. 3. Sigmoid diverticulosis without CT evidence of diverticulitis. Electronically Signed   By: Katherine Mantle M.D.   On: 12/02/2018 21:12   Ir Gastrostomy Tube Mod Sed  Result Date: 12/05/2018 INDICATION: 68 year old female with a history of percutaneous gastrostomy tube which was placed endoscopically on 09/12/2018. The tube was inadvertently displaced nearly 1 week ago last Saturday. Patient presents today for new percutaneous gastrostomy tube placement. EXAM: Fluoroscopically guided placement of percutaneous gastrostomy tube Interventional Radiologist:  Sterling Big, MD MEDICATIONS: None ANESTHESIA/SEDATION: Versed 3 mg IV; Fentanyl 150 mcg IV Moderate Sedation Time:  15 minutes The patient was continuously monitored during the procedure by the interventional radiology nurse under my direct supervision. CONTRAST:  10mL OMNIPAQUE IOHEXOL 300 MG/ML  SOLN FLUOROSCOPY TIME:  Fluoroscopy Time:  1 minutes 48 seconds (12 mGy). COMPLICATIONS: None immediate. PROCEDURE: Informed written consent was obtained from the patient after a thorough discussion of the procedural risks, benefits and alternatives. All questions were addressed. Maximal Sterile Barrier Technique was utilized including caps, mask, sterile gowns, sterile gloves, sterile drape, hand hygiene and skin antiseptic. A timeout was performed prior to the initiation of the procedure. Maximal barrier sterile technique utilized including caps, mask, sterile gowns, sterile gloves, large sterile drape, hand hygiene, and chlorhexadine skin prep. There is a tiny pinhole in the otherwise healed site of the prior percutaneous gastrostomy tube. The tip of a 5 French catheter was advanced into the hole and contrast injection performed. Unfortunately, all injected contrast material leaked back out along the skin surface. This contrast was clean. Next, a 5 [place] dilator was connected to a syringe of contrast material. The 5 [place] dilator was then manually inserted through the tiny orifice at the site of the prior gastrostomy tube. The 5 [place] dilator was carefully advanced manually. Tactile feedback was utilized 2 slowly advancing the 5 [place] dilator along the path of least resistance through the underlying musculature and subcutaneous tissues. Once the catheter had been advanced as far as possible. Contrast injection was again performed revealing that the tip of the dilator was nearly within the gastric lumen. There is a visible tract extending into the gastric lumen. A glidewire was then advanced through the dilator and successfully navigated into the stomach. The dilator was advanced into the stomach. The glidewire was then exchanged for a superstiff Amplatz wire. Local anesthesia was then attained to the anterior abdominal wall by injection with 1% lidocaine. The tract was then serially dilated with 10, 12, 16 and 18 [place] dilators. A 20 French peel-away  sheath was then advanced over the wire and into the stomach. A new into it 4 French balloon retention percutaneous gastrostomy tube was then advanced over the wire, through the peel-away sheath and into the gastric lumen. The peel-away sheath was removed. The retention balloon was inflated with 10 mL sterile saline and pulled snug against the anterior abdominal wall. The external bumper was fixed in place. A gentle injection of contrast material confirms that the tube is indeed within the gastric lumen. The tube was flushed  with saline. IMPRESSION: Successful placement of a 13 French balloon retention gastrostomy tube. Electronically Signed   By: Malachy Moan M.D.   On: 12/05/2018 16:57     Subjective: Pt says she wants to go home after PEG was placed.  She has no complaints.   Discharge Exam: Vitals:   12/05/18 0425 12/05/18 1247  BP: 121/73 111/89  Pulse: 76 (!) 102  Resp: 19 18  Temp: 98.5 F (36.9 C) 98.4 F (36.9 C)  SpO2: 98% 95%   Vitals:   12/04/18 2119 12/05/18 0425 12/05/18 0457 12/05/18 1247  BP: 116/74 121/73  111/89  Pulse: 79 76  (!) 102  Resp: 18 19  18   Temp: 98.2 F (36.8 C) 98.5 F (36.9 C)  98.4 F (36.9 C)  TempSrc: Oral   Oral  SpO2: 98% 98%  95%  Weight:   74.6 kg   Height:        General: Pt is alert, awake, not in acute distress Cardiovascular: RRR, S1/S2 +, no rubs, no gallops Respiratory: CTA bilaterally, no wheezing, no rhonchi Abdominal: Soft, NT, ND, bowel sounds + G tube in place no s/s of infection Extremities: no edema, no cyanosis   The results of significant diagnostics from this hospitalization (including imaging, microbiology, ancillary and laboratory) are listed below for reference.     Microbiology: Recent Results (from the past 240 hour(s))  SARS Coronavirus 2 (CEPHEID - Performed in [facility] hospital lab), Hosp Order     Status: None   Collection Time: 12/02/18  9:55 PM   Specimen: Nasopharyngeal Swab  Result Value Ref  Range Status   SARS Coronavirus 2 NEGATIVE NEGATIVE Final    Comment: (NOTE) If result is NEGATIVE SARS-CoV-2 target nucleic acids are NOT DETECTED. The SARS-CoV-2 RNA is generally detectable in upper and lower  respiratory specimens during the acute phase of infection. The lowest  concentration of SARS-CoV-2 viral copies this assay can detect is 250  copies / mL. A negative result does not preclude SARS-CoV-2 infection  and should not be used as the sole basis for treatment or other  patient management decisions.  A negative result may occur with  improper specimen collection / handling, submission of specimen other  than nasopharyngeal swab, presence of viral mutation(s) within the  areas targeted by this assay, and inadequate number of viral copies  (<250 copies / mL). A negative result must be combined with clinical  observations, patient history, and epidemiological information. If result is POSITIVE SARS-CoV-2 target nucleic acids are DETECTED. The SARS-CoV-2 RNA is generally detectable in upper and lower  respiratory specimens dur ing the acute phase of infection.  Positive  results are indicative of active infection with SARS-CoV-2.  Clinical  correlation with patient history and other diagnostic information is  necessary to determine patient infection status.  Positive results do  not rule out bacterial infection or co-infection with other viruses. If result is PRESUMPTIVE POSTIVE SARS-CoV-2 nucleic acids MAY BE PRESENT.   A presumptive positive result was obtained on the submitted specimen  and confirmed on repeat testing.  While 2019 novel coronavirus  (SARS-CoV-2) nucleic acids may be present in the submitted sample  additional confirmatory testing may be necessary for epidemiological  and / or clinical management purposes  to differentiate between  SARS-CoV-2 and other Sarbecovirus currently known to infect humans.  If clinically indicated additional testing with an  alternate test  methodology [phone]) is advised. The SARS-CoV-2 RNA is generally  detectable in upper and  lower respiratory sp ecimens during the acute  phase of infection. The expected result is Negative. Fact Sheet for Patients:  BoilerBrush.com.cy Fact Sheet for Healthcare Providers: https://pope.com/ This test is not yet approved or cleared by the [place] FDA and has been authorized for detection and/or diagnosis of SARS-CoV-2 by FDA under an Emergency Use Authorization (EUA).  This EUA will remain in effect (meaning this test can be used) for the duration of the COVID-19 declaration under Section 564(b)(1) of the Act, 21 U.S.C. section 360bbb-3(b)(1), unless the authorization is terminated or revoked sooner. Performed at [facility], [address] [place], [place] [address]   Surgical pcr screen     Status: None   Collection Time: 12/03/18 12:59 AM   Specimen: Nasal Mucosa; Nasal Swab  Result Value Ref Range Status   MRSA, PCR NEGATIVE NEGATIVE Final   Staphylococcus aureus NEGATIVE NEGATIVE Final    Comment: (NOTE) The Xpert SA Assay (FDA approved for NASAL specimens in patients 51 years of age and older), is one component of a comprehensive surveillance program. It is not intended to diagnose infection nor to guide or monitor treatment. Performed at [facility], [address] [place], [place] [address]      Labs: BNP (last 3 results) Recent Labs    07/06/18 1239  BNP 39.7   Basic Metabolic Panel: Recent Labs  Lab 12/01/18 1442 12/04/18 0509  NA 138 137  K 4.1 4.4  CL 104 102  CO2 25 23  GLUCOSE 99 85  BUN 17 13  CREATININE 0.87 0.78  CALCIUM 9.9 9.3  MG 1.9  --    Liver Function Tests: Recent Labs  Lab 12/01/18 1442  AST 25  ALT 41  ALKPHOS 64  BILITOT 0.5  PROT 7.6  ALBUMIN 3.7   No results for input(s): LIPASE, AMYLASE in the last 168 hours. No results for input(s): AMMONIA in the  last 168 hours. CBC: Recent Labs  Lab 12/01/18 1442 12/04/18 0509  WBC 5.8 3.4*  NEUTROABS 3.4  --   HGB 13.2 12.0  HCT 42.6 38.2  MCV 84.7 84.7  PLT 270 206   Cardiac Enzymes: No results for input(s): CKTOTAL, CKMB, CKMBINDEX, TROPONINI in the last 168 hours. BNP: Invalid input(s): POCBNP CBG: Recent Labs  Lab 12/04/18 2353 12/05/18 0404 12/05/18 0730 12/05/18 1145 12/05/18 1505  GLUCAP 90 80 75 72 83   D-Dimer No results for input(s): DDIMER in the last 72 hours. Hgb A1c No results for input(s): HGBA1C in the last 72 hours. Lipid Profile No results for input(s): CHOL, HDL, LDLCALC, TRIG, CHOLHDL, LDLDIRECT in the last 72 hours. Thyroid function studies No results for input(s): TSH, T4TOTAL, T3FREE, THYROIDAB in the last 72 hours.  Invalid input(s): FREET3 Anemia work up No results for input(s): VITAMINB12, FOLATE, FERRITIN, TIBC, IRON, RETICCTPCT in the last 72 hours. Urinalysis    Component Value Date/Time   COLORURINE YELLOW 07/06/2018 1555   APPEARANCEUR CLEAR 07/06/2018 1555   LABSPEC 1.012 07/06/2018 1555   PHURINE 6.0 07/06/2018 1555   GLUCOSEU NEGATIVE 07/06/2018 1555   HGBUR MODERATE (A) 07/06/2018 1555   BILIRUBINUR NEGATIVE 07/06/2018 1555   KETONESUR NEGATIVE 07/06/2018 1555   PROTEINUR NEGATIVE 07/06/2018 1555   UROBILINOGEN 0.2 04/11/2007 1936   NITRITE NEGATIVE 07/06/2018 1555   LEUKOCYTESUR MODERATE (A) 07/06/2018 1555   Sepsis Labs Invalid input(s): PROCALCITONIN,  WBC,  LACTICIDVEN Microbiology Recent Results (from the past 240 hour(s))  SARS Coronavirus 2 (CEPHEID - Performed in [facility] hospital  lab), Hosp Order     Status: None   Collection Time: 12/02/18  9:55 PM   Specimen: Nasopharyngeal Swab  Result Value Ref Range Status   SARS Coronavirus 2 NEGATIVE NEGATIVE Final    Comment: (NOTE) If result is NEGATIVE SARS-CoV-2 target nucleic acids are NOT DETECTED. The SARS-CoV-2 RNA is generally detectable in upper and lower   respiratory specimens during the acute phase of infection. The lowest  concentration of SARS-CoV-2 viral copies this assay can detect is 250  copies / mL. A negative result does not preclude SARS-CoV-2 infection  and should not be used as the sole basis for treatment or other  patient management decisions.  A negative result may occur with  improper specimen collection / handling, submission of specimen other  than nasopharyngeal swab, presence of viral mutation(s) within the  areas targeted by this assay, and inadequate number of viral copies  (<250 copies / mL). A negative result must be combined with clinical  observations, patient history, and epidemiological information. If result is POSITIVE SARS-CoV-2 target nucleic acids are DETECTED. The SARS-CoV-2 RNA is generally detectable in upper and lower  respiratory specimens dur ing the acute phase of infection.  Positive  results are indicative of active infection with SARS-CoV-2.  Clinical  correlation with patient history and other diagnostic information is  necessary to determine patient infection status.  Positive results do  not rule out bacterial infection or co-infection with other viruses. If result is PRESUMPTIVE POSTIVE SARS-CoV-2 nucleic acids MAY BE PRESENT.   A presumptive positive result was obtained on the submitted specimen  and confirmed on repeat testing.  While 2019 novel coronavirus  (SARS-CoV-2) nucleic acids may be present in the submitted sample  additional confirmatory testing may be necessary for epidemiological  and / or clinical management purposes  to differentiate between  SARS-CoV-2 and other Sarbecovirus currently known to infect humans.  If clinically indicated additional testing with an alternate test  methodology [phone] is advised. The SARS-CoV-2 RNA is generally  detectable in upper and lower respiratory sp ecimens during the acute  phase of infection. The expected result is Negative. Fact  Sheet for Patients:  BoilerBrush.com.cy Fact Sheet for Healthcare Providers: https://pope.com/ This test is not yet approved or cleared by the [place] FDA and has been authorized for detection and/or diagnosis of SARS-CoV-2 by FDA under an Emergency Use Authorization (EUA).  This EUA will remain in effect (meaning this test can be used) for the duration of the COVID-19 declaration under Section 564(b)(1) of the Act, 21 U.S.C. section 360bbb-3(b)(1), unless the authorization is terminated or revoked sooner. Performed at [facility], [address],  [place], [place] [address]   Surgical pcr screen     Status: None   Collection Time: 12/03/18 12:59 AM   Specimen: Nasal Mucosa; Nasal Swab  Result Value Ref Range Status   MRSA, PCR NEGATIVE NEGATIVE Final   Staphylococcus aureus NEGATIVE NEGATIVE Final    Comment: (NOTE) The Xpert SA Assay (FDA approved for NASAL specimens in patients 36 years of age and older), is one component of a comprehensive surveillance program. It is not intended to diagnose infection nor to guide or monitor treatment. Performed at [facility], [address], [place], [place] [address]     Time coordinating discharge:   SIGNED:  Standley Dakins, MD  [facility] 12/05/2018, 6:11 PM How to contact the [facility] Attending or Consulting provider 7A - 7P or covering provider during after hours 7P -7A, for this  patient?  Check the care team in [facility] and look for a) attending/consulting [facility] provider listed and b) the [facility] team listed Log into [email or web address] and use [place]'s universal password to access. If you do not have the password, please contact the hospital operator. Locate the [name] provider you are looking for under [facility] and page to a number that you can be directly reached. If you still have difficulty reaching the provider, please page the [facility] (Director on Call) for the Hospitalists listed  on amion for assistance.

## 2018-12-05 NOTE — Discharge Instructions (Signed)
How to Care for a Feeding Tube  A feeding tube is a tube used to give medicine, water, and liquid food. A person may have this tube if she or he has trouble swallowing or cannot take food or medicine. Supplies needed to care for the tube site:  Clean gloves.  Clean washcloth, gauze pads, or soft paper towel.  Cotton swabs.  A skin barrier ointment or cream, such as petroleum jelly.  Soap and water.  Pre-cut foam pads or gauze (for around the tube).  Tube tape.  An anchoring device (optional). How to care for the tube site  1. Have all supplies ready and close to you. 2. Wash your hands. 3. Put on gloves. 4. Change any pad or gauze near the tube if: ? It is dirty. ? It is wet. ? It has been there for more than one day. 5. Check the skin around the tube. Call the doctor if you see any of these: ? Red skin. ? A rash. ? Swelling. ? Leaking fluid. ? Extra skin. 6. Dip the gauze and cotton swabs in water and soap. 7. Use the cotton swabs to wipe the skin that is closest to the tube. 8. Use the gauze pads to wipe the rest of the skin near the tube. 9. Rinse with water. 10. Dry the area with a clean washcloth, dry gauze pad, or soft paper towel. 11. If the skin is red, use a cotton swab to put on a skin barrier cream or ointment. Put it on by making little circles. Do not apply antibiotic ointments at the tube site. 12. Put a new pre-cut foam pad or gauze around the tube. If there is no fluid at the tube site, you do not need a pad or gauze. 13. Tape down the edges. 14. Use tape or an anchoring device to attach the tube to the skin. Do this for comfort or as told. Each time you use tape, put it in a different place. 15. Sit the person up. 16. Throw away used supplies. 17. Take off your gloves. 18. Wash your hands. Supplies needed to flush a feeding tube:  Clean gloves.  A clean 60 mL syringe that connects to the feeding tube.  A towel.  Germ-free (sterile) or purified  water. Follow these rules: ? Use germ-free water if:  Your body's defense system (immune system) is weak and you have trouble getting better from infections (are immunocompromised).  You do not know how many chemicals are in your water. ? Do not use water from lakes or other bodies of water unless you treat it or filter it first. ? To make drinking water pure by boiling:  Boil water for 1 minute or longer. Keep a lid over the water while it boils.  Let the water cool off to room temperature before you use it. How to flush a feeding tube 1. Have all supplies ready and close to you. 2. Wash your hands. 3. Put on gloves. 4. Pull 30 mL of water into the syringe. 5. Before you push water into (flush) the tube, put the towel under the tube to catch any fluid leaks. 6. Bend (kink) the feeding tube while you do one of these things: ? Disconnect it from the feeding-bag tubing. ? Take off the cap at the end of the tube. 7. Put the tip of the syringe into the end of the feeding tube. 8. Stop bending the tube. 9. Use the syringe to slowly put the water  in the tube. If the water will not go in the tube: ? Have the person lie on his or her left side. ? Try putting the water in the tube again. ? Do not push hard to make the water go in. 10. Take out the syringe and put the cap on the tube. 11. Throw away used supplies. 12. Take off your gloves. 13. Wash your hands. Follow these instructions at home: Caring for the tube  If the person has a foam pad or gauze near the tube, change it: ? Every day. ? When it is dirty. ? When it is wet.  Do not put antibiotic ointments by the tube. Flushing the tube  Do not use a syringe that is smaller than 60 mL.  Flush the tube at all of these times: ? Before you give medicine. ? Between medicines. ? After the person gets the last medicine before a feeding.  Do not mix medicines with formula. Do not mix medicines with other medicines.  Completely  flush medicines through the tube. That way, they will not mix with formula. Contact a doctor if:  The tube gets blocked or clogged.  You find any of these on the skin around the tube site: ? Red skin. ? A rash. ? Swelling. ? Leaking fluid. ? Extra skin. Summary  A feeding tube is a tube used to give medicine, water, and liquid food. A person may have this tube if she or he has trouble swallowing or cannot take food or medicine.  Follow the doctor's instructions to care for the tube site and flush the tube every day.  Contact a doctor if the tube gets blocked or clogged. This information is not intended to replace advice given to you by your health care provider. Make sure you discuss any questions you have with your health care provider. Document Released: 02/06/2012 Document Revised: 04/26/2017 Document Reviewed: 06/22/2016 Elsevier Patient Education  2020 [employer].  Gastrostomy Tube Home Guide, Adult A gastrostomy tube, or G-tube, is a tube that is inserted through the abdomen into the stomach. The tube is used to give feedings and medicines when a person is unable to eat and drink enough on his or her own. How to care for a G-tube Supplies needed  Saline solution or clean, warm water and soap.  Cotton swab or gauze.  Precut gauze bandage (dressing) and tape, if needed. Instructions 1. Wash your hands with soap and water. 2. If there is a dressing between the person's skin and the tube, remove it. 3. Check the area where the tube enters the skin. Check for problems such as: ? Redness. ? Swelling. ? Pus-like drainage. ? Extra skin growth. 4. Moisten the cotton swab with the saline solution or soap and water mixture. Gently clean around the insertion site. Remove any drainage or crusting. ? When the G-tube is first put in, a normal saline solution or water can be used to clean the skin. ? Mild soap and warm water can be used when the skin around the G-tube site has  healed. 5. If there should be a dressing between the person's skin and the tube, apply it at this time. How to flush a G-tube Flush the G-tube regularly to keep it from clogging. Flush it before and after feedings and as often as told by the health care provider. Supplies needed  Purified or sterile water, warmed. If the person has a weak disease-fighting (immune) system, or if he or she has difficulty  fighting off infections (is immunocompromised), use only sterile water. ? If you are unsure about the amount of chemical contaminants in purified or drinking water, use sterile water. ? To purify drinking water by boiling:  Boil water for at least 1 minute. Keep lid over water while it boils. Allow water to cool to room temperature before using.  60cc G-tube syringe. Instructions 1. Wash your hands with soap and water. 2. Draw up 30 mL of warm water in a syringe. 3. Connect the syringe to the tube. 4. Slowly and gently push the water into the tube. G-tube problems and solutions  If the tube comes out: ? Cover the opening with a clean dressing and tape. ? Call a health care provider right away. ? A health care provider will need to put the tube back in within 4 hours.  If there is skin or scar tissue growing where the tube enters the skin: ? Keep the area clean and dry. ? Secure the tube with tape so that the tube does not move around too much. ? Call a health care provider.  If the tube gets clogged: ? Slowly push warm water into the tube with a large syringe. ? Do not force the fluid into the tube or push an object into the tube. ? If you are not able to unclog the tube, call a health care provider right away. Follow these instructions at home: Feedings  Give feedings at room temperature.  Cover and place unused feedings in the refrigerator.  If feedings are continuous: ? Do not put more than 4 hours worth of feedings in the feeding bag. ? Stop the feedings when you need to  give medicine or flush the tube. Be sure to restart the feedings. ? Make sure the person's head is above his or her stomach (upright position). This will prevent choking and discomfort.  Replace feeding bags and syringes as told by the health care provider.  Make sure the person is in the right position during and after feedings: ? During feedings, the person's position should be in the upright position. ? After a noncontinuous feeding (bolus feeding), have the person stay in the upright position for 1 hour. General instructions  Only use syringes made for G-tubes.  Do not pull or put tension on the tube.  Clamp the tube before removing the cap or disconnecting a syringe.  Measure the length of the G-tube every day from the insertion site to the end of the tube.  If the person's G-tube has a balloon, check the fluid in the balloon every week. The amount of fluid that should be in the balloon can be found in the manufacturers specifications.  Make sure the person takes care of his or her oral health, such as by brushing his or her teeth.  Remove excess air from the G-tube as told by the person's health care provider. This is called "venting."  Keep the area where the tube enters the skin clean and dry.  Do not push feedings, medicines, or flushes rapidly. Contact a health care provider if:  The person with the tube has any of these problems: ? Constipation. ? Fever.  There is a large amount of fluid or mucus-like liquid leaking from the tube.  Skin or scar tissue appears to be growing where the tube enters the skin.  The length of tube from the insertion site to the G-tube gets longer. Get help right away if:  The person with the tube has  any of these problems: ? Severe abdominal pain. ? Severe tenderness. ? Severe bloating. ? Nausea. ? Vomiting. ? Trouble breathing. ? Shortness of breath.  Any of these problems happen in the area where the tube enters the  skin: ? Redness, irritation, swelling, or soreness. ? Pus-like discharge. ? A bad smell.  The tube is clogged and cannot be flushed.  The tube comes out. Summary  A gastrostomy tube, or G-tube, is a tube that is inserted through the abdomen into the stomach. The tube is used to give feedings and medicines when a person is unable to eat and drink enough on his or her own.  Check and clean the insertion site daily as told by the person's health care provider.  Flush the G-tube regularly to keep it from clogging. Flush it before and after feedings and as often as told by the person's health care provider.  Keep the area where the tube enters the skin clean and dry. This information is not intended to replace advice given to you by your health care provider. Make sure you discuss any questions you have with your health care provider. Document Released: 07/23/2001 Document Revised: 04/26/2017 Document Reviewed: 07/09/2016 Elsevier Patient Education  2020 [place].    IMPORTANT INFORMATION: PAY CLOSE ATTENTION   PHYSICIAN DISCHARGE INSTRUCTIONS  Follow with Primary care provider  [name], MD  and other consultants as instructed by your Hospitalist Physician  [place] IF SYMPTOMS COME BACK, WORSEN OR NEW PROBLEM DEVELOPS   Please note: You were cared for by a hospitalist during your hospital stay. Every effort will be made to forward records to your primary care provider.  You can request that your primary care provider send for your hospital records if they have not received them.  Once you are discharged, your primary care physician will handle any further medical issues. Please note that NO REFILLS for any discharge medications will be authorized once you are discharged, as it is imperative that you return to your primary care physician (or establish a relationship with a primary care physician if you do not have one) for your post hospital  discharge needs so that they can reassess your need for medications and monitor your lab values.  Please get a complete blood count and chemistry panel checked by your Primary MD at your next visit, and again as instructed by your Primary MD.  Get Medicines reviewed and adjusted: Please take all your medications with you for your next visit with your Primary MD  Laboratory/radiological data: Please request your Primary MD to go over all hospital tests and procedure/radiological results at the follow up, please ask your primary care provider to get all Hospital records sent to his/her office.  In some cases, they will be blood work, cultures and biopsy results pending at the time of your discharge. Please request that your primary care provider follow up on these results.  If you are diabetic, please bring your blood sugar readings with you to your follow up appointment with primary care.    Please call and make your follow up appointments as soon as possible.    Also Note the following: If you experience worsening of your admission symptoms, develop shortness of breath, life threatening emergency, suicidal or homicidal thoughts you must seek medical attention immediately by calling 911 or calling your MD immediately  if symptoms less severe.  You must read complete instructions/literature along with all the possible adverse reactions/side  effects for all the Medicines you take and that have been prescribed to you. Take any new Medicines after you have completely understood and accpet all the possible adverse reactions/side effects.   Do not drive when taking Pain medications or sleeping medications (Benzodiazepines)  Do not take more than prescribed Pain, Sleep and Anxiety Medications. It is not advisable to combine anxiety,sleep and pain medications without talking with your primary care practitioner  Special Instructions: If you have smoked or chewed Tobacco  in the last 2 yrs please stop  smoking, stop any regular Alcohol  and or any Recreational drug use.  Wear Seat belts while driving.  Do not drive if taking any narcotic, mind altering or controlled substances or recreational drugs or alcohol.

## 2018-12-16 ENCOUNTER — Encounter (HOSPITAL_COMMUNITY): Payer: Self-pay | Admitting: Dietician

## 2018-12-16 NOTE — Progress Notes (Signed)
Nutrition Follow up  ASSESSMENT:  68 y/o female w/ PMHx HTN, DM2, Stage  IV adenocarcinoma of lung. Dx in Dec. with 12/18 PET findings consistent w/ metastatic disease. First seen at [facility] on 12/23. MRI brain 1/3 showed ~20 brain mets. Lung Biopsy 1/22 + for adenocarcinoma.  Begun oral chemo 3/2. S/P WBRT 3/3. D/t intractable nausea and resultant FTT, pt is s/p hospitalization 4/16-4/18 w/ PEG placement and initiation of TF. S/P peg replacement 7/10  Following up w/ patient following recent hospitalization for PEG replacement.   Interim hx:  Pt recently seen inpatient after she was admitted for PEG replacement 7/7-7/10. She had a period of ~5 days where she did not or could not use her PEG. She was discharged home immediately after placement 7/10. Pt had not yet started on reduced tagrisso dose.   On calling today, pt says thing have more or less returned to normal following her PEG placement. She is back to infusing ~3-4 cans of osmolite and drinking 3-4 bottles of Boost Original (240 kcals/10g Pro) each day.   Fortunately, pt says she "is eating better" since her tagrisso dose was reduced. She is actually eating pancakes at the time of RD calling. She says her poor sense of taste and sensitive teeth still remain though. She denies any further problems  Pt was 164.5 lbs at d/c from hospital, though this wt likely skewed from her acuity, IVF/swelling etc. She was admitted to hosp at 157.8 lbs. This was in line with weight at last office visit 6/15 (158 lbs). Pt has not had any wt gain since Peg placed 4/17-has been 159-163 since. Felt to be d/t impaired anabolism d/t cancer. Has maintained well though. Based on office wts, Pt is down 40 lbs ~since she was diagnosed at the end of last Nov (=-20% bw x 8 months).   Wt Readings from Last 10 Encounters:  12/05/18 164 lb 7.4 oz (74.6 kg)  12/01/18 158 lb (71.7 kg)  11/10/18 159 lb (72.1 kg)  10/16/18 162 lb 3 oz (73.6 kg)  10/01/18 159 lb (72.1 kg)   09/17/18 163 lb (73.9 kg)  09/11/18 159 lb 13.3 oz (72.5 kg)  08/26/18 168 lb 12.8 oz (76.6 kg)  08/15/18 168 lb (76.2 kg)  08/06/18 169 lb 9.6 oz (76.9 kg)   MEDICATIONS: Chemo: Tagrisso  Supportive medications: KCL, Compazine, ppi, ativan, zofran, reglan, ambien.   LABS:  None in last 1.5 weeks. Last labs from her hospitalization were WDL.   ANTHROPOMETRICS: Height:  Ht Readings from Last 1 Encounters:  12/03/18 _0  (1.676 m)   Weight:  Wt Readings from Last 1 Encounters:  12/05/18 164 lb 7.4 oz (74.6 kg)   BMI:  BMI Readings from Last 1 Encounters:  12/05/18 26.55 kg/m   UBW: 213 lbs (per pt report) Wt changes  Short term: Stable at 158-162 x 3  months Long term: -40 lbs (19.7% bw) since dx at end of Nov (198 at that time)  ESTIMATED ENERGY NEEDS: (based on last office wt 7/6) Kcal:  1950-2150 kcals (27-30 kcal/kg bw) Protein:  93-108g Pro (1.3-1.5g/kg bw) Fluid:  2-2.2 L fluid (38m/kcal)  NUTRITION DIAGNOSIS:  Increased nutrient needs r/t cancer and cancer related treatments as evidenced by the nutrition recommendations for this disease state.   Chronic- ongoing indefinitely  DOCUMENTATION CODES:  Not applicable  INTERVENTION:  Pt sounds to quickly have returned to her baseline following her PEG replacement. She is back to administering the same amount of TF and  drinking the same amount of Boost as she was prior. She denies any new complaints.   Conversely, she reports she has had an increase in her appetite, which may be attributable to her tagrisso dosage change. This was made only a short while ago and hopefully she will be able to see further improvement in oral intake/QOL.   No TF/oral supplements changes recommended today. Pt is stable. She will follow up with alternate RD for now on. Given stability, will push f/u out x1 month.   Estimated intake: Her supplemental TF regimen (3-4 cans Osmolite 1.5) is providing her: 1065-1420 kcals and 45-60g Pro.  She is receiving atleast another 720 kcals and 30g Pro from the 3-4 Boost Original supplements she drinks. She also receives a small additional amount of nutrition from the various bites/sips she takes during the day. Based on this, she is estimated to be meeting ~100% of kcal/pro needs. This assessment is supported by her weight stability.   GOAL:  Pt will be able to tolerate 5 cans of TF/day OR will demonstrate sufficient oral intake to meet >90% of needs when combined with tube feeding -BELIEVED MET   MONITOR:  Chemotherapy tolerance, labs, weights, oral intake, TF tolerance    Next Visit: 4.5 weeks - 8/21  Burtis Junes RD, LDN, CNSC Clinical Nutrition Available Tues-Sat via Pager: [phone] 12/16/2018 11:31 AM

## 2018-12-17 DIAGNOSIS — C3490 Malignant neoplasm of unspecified part of unspecified bronchus or lung: Secondary | ICD-10-CM | POA: Diagnosis not present

## 2018-12-22 ENCOUNTER — Encounter (HOSPITAL_COMMUNITY): Payer: Self-pay | Admitting: Hematology

## 2018-12-22 ENCOUNTER — Inpatient Hospital Stay (HOSPITAL_COMMUNITY): Payer: Medicare HMO

## 2018-12-22 ENCOUNTER — Inpatient Hospital Stay (HOSPITAL_BASED_OUTPATIENT_CLINIC_OR_DEPARTMENT_OTHER): Payer: Medicare HMO | Admitting: Hematology

## 2018-12-22 ENCOUNTER — Encounter (HOSPITAL_COMMUNITY): Payer: Self-pay | Admitting: *Deleted

## 2018-12-22 ENCOUNTER — Other Ambulatory Visit: Payer: Self-pay

## 2018-12-22 DIAGNOSIS — Z79899 Other long term (current) drug therapy: Secondary | ICD-10-CM

## 2018-12-22 DIAGNOSIS — Z7901 Long term (current) use of anticoagulants: Secondary | ICD-10-CM | POA: Diagnosis not present

## 2018-12-22 DIAGNOSIS — R634 Abnormal weight loss: Secondary | ICD-10-CM

## 2018-12-22 DIAGNOSIS — I10 Essential (primary) hypertension: Secondary | ICD-10-CM

## 2018-12-22 DIAGNOSIS — I2699 Other pulmonary embolism without acute cor pulmonale: Secondary | ICD-10-CM

## 2018-12-22 DIAGNOSIS — C3492 Malignant neoplasm of unspecified part of left bronchus or lung: Secondary | ICD-10-CM

## 2018-12-22 DIAGNOSIS — C7931 Secondary malignant neoplasm of brain: Secondary | ICD-10-CM | POA: Diagnosis not present

## 2018-12-22 DIAGNOSIS — C3432 Malignant neoplasm of lower lobe, left bronchus or lung: Secondary | ICD-10-CM

## 2018-12-22 DIAGNOSIS — C3491 Malignant neoplasm of unspecified part of right bronchus or lung: Secondary | ICD-10-CM

## 2018-12-22 LAB — CBC WITH DIFFERENTIAL/PLATELET
Abs Immature Granulocytes: 0.02 10*3/uL (ref 0.00–0.07)
Basophils Absolute: 0 10*3/uL (ref 0.0–0.1)
Basophils Relative: 1 %
Eosinophils Absolute: 0.5 10*3/uL (ref 0.0–0.5)
Eosinophils Relative: 9 %
HCT: 44.5 % (ref 36.0–46.0)
Hemoglobin: 13.8 g/dL (ref 12.0–15.0)
Immature Granulocytes: 0 %
Lymphocytes Relative: 18 %
Lymphs Abs: 0.9 10*3/uL (ref 0.7–4.0)
MCH: 26.2 pg (ref 26.0–34.0)
MCHC: 31 g/dL (ref 30.0–36.0)
MCV: 84.6 fL (ref 80.0–100.0)
Monocytes Absolute: 0.7 10*3/uL (ref 0.1–1.0)
Monocytes Relative: 15 %
Neutro Abs: 2.9 10*3/uL (ref 1.7–7.7)
Neutrophils Relative %: 57 %
Platelets: 213 10*3/uL (ref 150–400)
RBC: 5.26 MIL/uL — ABNORMAL HIGH (ref 3.87–5.11)
RDW: 16.2 % — ABNORMAL HIGH (ref 11.5–15.5)
WBC: 5 10*3/uL (ref 4.0–10.5)
nRBC: 0 % (ref 0.0–0.2)

## 2018-12-22 LAB — COMPREHENSIVE METABOLIC PANEL
ALT: 43 U/L (ref 0–44)
AST: 29 U/L (ref 15–41)
Albumin: 4.1 g/dL (ref 3.5–5.0)
Alkaline Phosphatase: 61 U/L (ref 38–126)
Anion gap: 8 (ref 5–15)
BUN: 14 mg/dL (ref 8–23)
CO2: 24 mmol/L (ref 22–32)
Calcium: 10 mg/dL (ref 8.9–10.3)
Chloride: 106 mmol/L (ref 98–111)
Creatinine, Ser: 0.86 mg/dL (ref 0.44–1.00)
GFR calc Af Amer: >60 mL/min (ref >60)
GFR calc non Af Amer: >60 mL/min (ref >60)
Glucose, Bld: 88 mg/dL (ref 70–99)
Potassium: 4.4 mmol/L (ref 3.5–5.1)
Sodium: 138 mmol/L (ref 135–145)
Total Bilirubin: 0.2 mg/dL — ABNORMAL LOW (ref 0.3–1.2)
Total Protein: 7.6 g/dL (ref 6.5–8.1)

## 2018-12-22 LAB — MAGNESIUM: Magnesium: 2 mg/dL (ref 1.7–2.4)

## 2018-12-22 MED ORDER — OSIMERTINIB MESYLATE 40 MG PO TABS: 40.0000 mg | ORAL_TABLET | Freq: Every day | ORAL | 0 refills | Status: DC

## 2018-12-22 MED FILL — TAGRISSO 40 MG TABLET: 40 | 30 days supply | Qty: 30 | Fill #0

## 2018-12-22 NOTE — Progress Notes (Signed)
I talked with patient after the visit with Dr. Delton Coombes today. She said that she is needing to gain some weight.  I told her that she can drink the boost/ensures that are higher in calories.  She can also mix it with ice cream and this will give her more calories.  She verbalizes understanding and said that she will try to eat more and drink more to help put on some weight.  I told her to call should she have any problems or questions.

## 2018-12-22 NOTE — Patient Instructions (Signed)
[facility] at [facility] Discharge Instructions  You were seen today by Dr. Katragadda. He went over your recent lab results. He will see you back in 3 weeks for labs and follow up.   Thank you for choosing [facility] at [facility] to provide your oncology and hematology care.  To afford each patient quality time with our provider, please arrive at least 15 minutes before your scheduled appointment time.   If you have a lab appointment with the Cancer Center please come in thru the  Main Entrance and check in at the main information desk  You need to re-schedule your appointment should you arrive 10 or more minutes late.  We strive to give you quality time with our providers, and arriving late affects you and other patients whose appointments are after yours.  Also, if you no show three or more times for appointments you may be dismissed from the clinic at the providers discretion.     Again, thank you for choosing [facility].  Our hope is that these requests will decrease the amount of time that you wait before being seen by our physicians.       _____________________________________________________________  Should you have questions after your visit to [facility], please contact our office at [phone] between the hours of 8:00 a.m. and 4:30 p.m.  Voicemails left after 4:00 p.m. will not be returned until the following business day.  For prescription refill requests, have your pharmacy contact our office and allow 72 hours.    Cancer Center Support Programs:   > Cancer Support Group  2nd Tuesday of the month 1pm-2pm, Journey Room    

## 2018-12-22 NOTE — Assessment & Plan Note (Signed)
1.  Metastatic adenocarcinoma of the left lung with multiple brain mets: -Guardant 360 showing EGFR exon 19 deletion (EGFR [id number]) - Presentation to ER on 04/24/2018 with shortness of breath.  She was never smoker. -CT PE protocol showed large pulmonary embolus in the descending interlobar pulmonary artery with occlusion of the right lower lobe pulmonary artery and additional small clots noted in the right middle lobe and right upper lobes with associated right pleural effusion and right lower lobe infarct.  It also showed incidental left lower lobe mass measuring 3.8 x 3.7 x 2.1 cm associated with hilar and mediastinal adenopathy. - PET/CT scan on 05/14/2018 shows hypermetabolic left lower lobe lung mass measuring 3.7 cm, SUV of 11.  Bilateral hilar adenopathy and subcarinal adenopathy.  Both adrenal glands appear thickened and irregular with increased SUV.  Right lower lobe airspace disease has hypermetabolic rim and a hypermetabolic nodule medial to the main airspace disease.  This may represent evolving infection/inflammation. - MRI of the brain on 05/30/2018 shows 20 subcentimeter enhancing brain lesions consistent with metastasis, no edema. - Left lower lobe lung biopsy on 06/18/2018 consistent with adenocarcinoma.  There was not enough tissue to run molecular testing. - Biopsy of the right supraclavicular lymph node on 07/11/2018 was consistent with metastatic adenocarcinoma. -Osimertinib 80 mg daily was started on 07/28/2018. - MRI of the brain on 11/01/2018 showed excellent response with only faint enhancement of a few residual subcentimeter left hemisphere lesions.  -CT of the chest on 11/03/2018 with no evidence of adenopathy.  There is ill-defined soft tissue in the para tracheal fat without discrete measurable lesion.  Visualized portions of the liver, adrenal glands and right kidney are unremarkable.  Airspace opacification in the medial aspect of the bilateral hemithoraces and the left lower  lobe as well.  - Patient had gastrostomy tube placed on 12/05/2018. - She started reduced dose of osimertinib 40 mg daily around 12/08/2018. - Her appetite is better since we cut back on the dose.  She also reports improvement in her taste. - I will see her back in 3 weeks with repeat blood counts.  2.  Recurrent pulmonary embolism: -This is likely secondary to active malignancy.  CT PE protocol on 04/24/2018 shows PE in the right lower lobe pulmonary artery.  She was started on Eliquis. -CT of the chest with contrast on 07/10/2018 shows bilateral pulmonary emboli, on the left lung new since diagnosis.  -She will continue Lovenox injections daily.  3.  Weight loss: - She had PEG tube clogged on gastrostomy tube was placed on 12/05/2018. -She lost about 1 pound since last visit. -Since we cut back on the dose of osimertinib to 40 mg daily, appetite improved.  She is eating better. - She is taking in 3 to 4 cans of Osmolite daily.  She is also drinking about 3 to 4 cans of boost per day and eating twice a day.

## 2018-12-22 NOTE — Progress Notes (Signed)
[address] [place], [place] [address]   CLINIC:  Medical Oncology/Hematology  PCP:  [name], [place] [id number] [phone]   REASON FOR VISIT:  Follow-up for metastatic lung cancer.     INTERVAL HISTORY:  [name] 68 y.o. female seen for follow-up of metastatic lung cancer.  Appetite is 50%.  Energy levels are 50%.  Pain is reported as 0.  Reportedly drinking about 3 to 4 cans of boost per day.  Eating twice daily.  Started Osmolite 40 mg daily about 2 weeks ago.  She is also taking in 3 to 4 cans of Osmolite daily.  She had gastrostomy tube done on 12/05/2018 as her PEG tube was nonfunctioning.  Denies any fevers, night sweats or weight loss.  Denies any nausea, vomiting, diarrhea or constipation.  Denies any fevers or infections.    REVIEW OF SYSTEMS:  Review of Systems  Constitutional: Negative for fatigue.  HENT:  Negative.   Eyes: Negative.   Respiratory: Negative.   Cardiovascular: Negative.   Gastrointestinal: Negative.   Endocrine: Negative.   Genitourinary: Negative.    Musculoskeletal: Negative.   Skin: Negative.   Neurological: Negative.   Hematological: Negative.   Psychiatric/Behavioral: Negative.   All other systems reviewed and are negative.    PAST MEDICAL/SURGICAL HISTORY:  Past Medical History:  Diagnosis Date  . Asthma   . Cancer ([place]) 04/21/2018   Lung Cancer   . Carpal tunnel syndrome   . Cataract    left eye  . Diabetes mellitus without complication ([place])   . DVT (deep venous thrombosis) ([place])   . Hypertension   . Pneumonia 04/21/2018  . Pulmonary embolism ([place]) 03/2018   Past Surgical History:  Procedure Laterality Date  . ABDOMINAL HYSTERECTOMY    . biopsy of right breast    . ESOPHAGOGASTRODUODENOSCOPY (EGD) WITH PROPOFOL N/A 09/12/2018   Procedure: ESOPHAGOGASTRODUODENOSCOPY (EGD) WITH PROPOFOL (scope in 1017, scope out 1032);  Surgeon: Virl Cagey, MD;   Location: [facility];  Service: General;  Laterality: N/A;  . FOOT SURGERY    . implant left eye    . INTRAOCULAR LENS INSERTION    . IR GASTROSTOMY TUBE MOD SED  12/05/2018  . PEG PLACEMENT N/A 09/12/2018   Procedure: PERCUTANEOUS ENDOSCOPIC GASTROSTOMY (PEG) PLACEMENT;  Surgeon: Virl Cagey, MD;  Location: [facility];  Service: General;  Laterality: N/A;     SOCIAL HISTORY:  Social History   Socioeconomic History  . Marital status: Widowed    Spouse name: Not on file  . Number of children: 4  . Years of education: 64  . Highest education level: High school graduate  Occupational History  . Not on file  Social Needs  . Financial resource strain: Very hard  . Food insecurity    Worry: Sometimes true    Inability: Sometimes true  . Transportation needs    Medical: No    Non-medical: No  Tobacco Use  . Smoking status: Never Smoker  . Smokeless tobacco: Never Used  Substance and Sexual Activity  . Alcohol use: Not Currently    Comment: occ  . Drug use: No  . Sexual activity: Yes    Partners: Male    Birth control/protection: Surgical  Lifestyle  . Physical activity    Days per week: 0 days    Minutes per session: Not on file  . Stress: Only a little  Relationships  . Social connections  Talks on phone: More than three times a week    Gets together: More than three times a week    Attends religious service: More than 4 times per year    Active member of club or organization: Yes    Attends meetings of clubs or organizations: More than 4 times per year    Relationship status: Widowed  . Intimate partner violence    Fear of current or ex partner: No    Emotionally abused: No    Physically abused: No    Forced sexual activity: No  Other Topics Concern  . 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    FAMILY HISTORY:  Family History  Problem Relation Age of Onset  . Heart failure Mother   . Stroke Mother   . Heart attack Mother   . Kidney failure Other   . Leukemia Father     CURRENT MEDICATIONS:  Outpatient Encounter Medications as of 12/22/2018  Medication Sig Note  . acetaminophen (TYLENOL) 325 MG tablet Take 2 tablets (650 mg total) by mouth every 6 (six) hours as needed for mild pain (or Fever >/= 101).   Marland Kitchen albuterol (PROVENTIL HFA;VENTOLIN HFA) 108 (90 BASE) MCG/ACT inhaler Inhale 2 puffs into the lungs every 6 (six) hours as needed for wheezing.   Marland Kitchen albuterol (PROVENTIL) (2.5 MG/3ML) 0.083% nebulizer solution Take 3 mLs (2.5 mg total) by nebulization every 6 (six) hours as needed for wheezing or shortness of breath.   Marland Kitchen amLODipine (NORVASC) 5 MG tablet Take 5 mg by mouth daily.   . colchicine 0.6 MG tablet Take 1 tablet (0.6 mg total) by mouth daily. 12/02/2018: Not consistent in taking daily  . enoxaparin (LOVENOX) 150 MG/ML injection Inject 1 mL (150 mg total) into the skin daily.   Marland Kitchen HYDROcodone-acetaminophen (NORCO/VICODIN) 5-325 MG tablet    . ibuprofen (ADVIL) 200 MG tablet Take 200 mg by mouth every 6 (six) hours as needed for mild pain or moderate pain.   Marland Kitchen lactose free nutrition (BOOST) LIQD Take 237 mLs by mouth 4 (four) times daily.   Marland Kitchen LORazepam (ATIVAN) 0.5 MG tablet Take 0.5 mg by mouth at bedtime as needed for anxiety.   . metoCLOPramide (REGLAN) 10 MG tablet Take 1 tablet (10 mg total) by mouth every 8 (eight) hours as needed for refractory nausea / vomiting. (Patient taking differently: Take 10 mg by mouth daily as needed for nausea or vomiting. )   . ondansetron (ZOFRAN ODT) 4 MG disintegrating tablet Take 1 tablet (4 mg total) by mouth every 8 (eight) hours as needed for nausea or vomiting.   Marland Kitchen osimertinib mesylate (TAGRISSO) 40 MG tablet Take 1 tablet (40 mg total) by mouth daily.   . pantoprazole (PROTONIX) 40 MG tablet  TAKE 1 TABLET BY MOUTH ONCE DAILY. (Patient taking differently: Take 40 mg by mouth daily. )   . potassium chloride (K-DUR) 10 MEQ tablet Take 2 tablets (20 mEq total) by mouth 2 (two) times daily.   . prochlorperazine (COMPAZINE) 10 MG tablet TAKE (1) TABLET BY MOUTH EVERY SIX HOURS AS NEEDED FOR NAUSEA OR VOMITING. (Patient taking differently: Take 10 mg by mouth every 6 (six) hours as needed for nausea or vomiting. )   . Water  For Irrigation, Sterile (FREE WATER) SOLN Place 200 mLs into feeding tube every 6 (six) hours.   Marland Kitchen zolpidem (AMBIEN CR) 6.25 MG CR tablet Take 1 tablet (6.25 mg total) by mouth at bedtime as needed for sleep. (Patient taking differently: Take 6.25 mg by mouth at bedtime. )   . [DISCONTINUED] osimertinib mesylate (TAGRISSO) 40 MG tablet Take 1 tablet (40 mg total) by mouth daily.    No facility-administered encounter medications on file as of 12/22/2018.     ALLERGIES:  Allergies  Allergen Reactions  . Banana Anaphylaxis  . Aspirin Other (See Comments)    G.I. Upset      PHYSICAL EXAM:  ECOG Performance status: 1  Vitals:   12/22/18 1400  BP: 117/77  Pulse: (!) 110  Resp: 16  Temp: 98.5 F (36.9 C)  SpO2: 99%   Filed Weights   12/22/18 1400  Weight: 157 lb (71.2 kg)    Physical Exam Vitals signs reviewed.  Constitutional:      Appearance: Normal appearance.  Cardiovascular:     Rate and Rhythm: Normal rate and regular rhythm.     Heart sounds: Normal heart sounds.  Pulmonary:     Effort: Pulmonary effort is normal.     Breath sounds: Normal breath sounds.  Abdominal:     General: There is no distension.     Palpations: Abdomen is soft. There is no mass.  Musculoskeletal:        General: No swelling.  Skin:    General: Skin is warm.  Neurological:     General: No focal deficit present.     Mental Status: She is alert and oriented to person, place, and time.  Psychiatric:        Mood and Affect: Mood normal.        Behavior: Behavior  normal.      LABORATORY DATA:  I have reviewed the labs as listed.  CBC    Component Value Date/Time   WBC 5.0 12/22/2018 1417   RBC 5.26 (H) 12/22/2018 1417   HGB 13.8 12/22/2018 1417   HCT 44.5 12/22/2018 1417   PLT 213 12/22/2018 1417   MCV 84.6 12/22/2018 1417   MCH 26.2 12/22/2018 1417   MCHC 31.0 12/22/2018 1417   RDW 16.2 (H) 12/22/2018 1417   LYMPHSABS 0.9 12/22/2018 1417   MONOABS 0.7 12/22/2018 1417   EOSABS 0.5 12/22/2018 1417   BASOSABS 0.0 12/22/2018 1417   CMP Latest Ref Rng & Units 12/22/2018 12/04/2018 12/01/2018  Glucose 70 - 99 mg/dL 88 85 99  BUN 8 - 23 mg/dL '14 13 17  '$ Creatinine 0.44 - 1.00 mg/dL 0.86 0.78 0.87  Sodium 135 - 145 mmol/L 138 137 138  Potassium 3.5 - 5.1 mmol/L 4.4 4.4 4.1  Chloride 98 - 111 mmol/L 106 102 104  CO2 22 - 32 mmol/L '24 23 25  '$ Calcium 8.9 - 10.3 mg/dL 10.0 9.3 9.9  Total Protein 6.5 - 8.1 g/dL 7.6 - 7.6  Total Bilirubin 0.3 - 1.2 mg/dL 0.2(L) - 0.5  Alkaline Phos 38 - 126 U/L 61 - 64  AST 15 - 41 U/L 29 - 25  ALT 0 - 44 U/L 43 - 41       DIAGNOSTIC IMAGING:  I have independently reviewed the scans and discussed with the patient.      ASSESSMENT & PLAN:   Metastatic lung cancer (metastasis from lung to other site), left ([place]) 1.  Metastatic adenocarcinoma of the left lung with multiple  brain mets: -Guardant 360 showing EGFR exon 19 deletion (EGFR [id number]) - Presentation to ER on 04/24/2018 with shortness of breath.  She was never smoker. -CT PE protocol showed large pulmonary embolus in the descending interlobar pulmonary artery with occlusion of the right lower lobe pulmonary artery and additional small clots noted in the right middle lobe and right upper lobes with associated right pleural effusion and right lower lobe infarct.  It also showed incidental left lower lobe mass measuring 3.8 x 3.7 x 2.1 cm associated with hilar and mediastinal adenopathy. - PET/CT scan on 05/14/2018 shows hypermetabolic left lower  lobe lung mass measuring 3.7 cm, SUV of 11.  Bilateral hilar adenopathy and subcarinal adenopathy.  Both adrenal glands appear thickened and irregular with increased SUV.  Right lower lobe airspace disease has hypermetabolic rim and a hypermetabolic nodule medial to the main airspace disease.  This may represent evolving infection/inflammation. - MRI of the brain on 05/30/2018 shows 20 subcentimeter enhancing brain lesions consistent with metastasis, no edema. - Left lower lobe lung biopsy on 06/18/2018 consistent with adenocarcinoma.  There was not enough tissue to run molecular testing. - Biopsy of the right supraclavicular lymph node on 07/11/2018 was consistent with metastatic adenocarcinoma. -Osimertinib 80 mg daily was started on 07/28/2018. - MRI of the brain on 11/01/2018 showed excellent response with only faint enhancement of a few residual subcentimeter left hemisphere lesions.  -CT of the chest on 11/03/2018 with no evidence of adenopathy.  There is ill-defined soft tissue in the para tracheal fat without discrete measurable lesion.  Visualized portions of the liver, adrenal glands and right kidney are unremarkable.  Airspace opacification in the medial aspect of the bilateral hemithoraces and the left lower lobe as well.  - Patient had gastrostomy tube placed on 12/05/2018. - She started reduced dose of osimertinib 40 mg daily around 12/08/2018. - Her appetite is better since we cut back on the dose.  She also reports improvement in her taste. - I will see her back in 3 weeks with repeat blood counts.  2.  Recurrent pulmonary embolism: -This is likely secondary to active malignancy.  CT PE protocol on 04/24/2018 shows PE in the right lower lobe pulmonary artery.  She was started on Eliquis. -CT of the chest with contrast on 07/10/2018 shows bilateral pulmonary emboli, on the left lung new since diagnosis.  -She will continue Lovenox injections daily.  3.  Weight loss: - She had PEG tube clogged on  gastrostomy tube was placed on 12/05/2018. -She lost about 1 pound since last visit. -Since we cut back on the dose of osimertinib to 40 mg daily, appetite improved.  She is eating better. - She is taking in 3 to 4 cans of Osmolite daily.  She is also drinking about 3 to 4 cans of boost per day and eating twice a day.        Total time spent is 25 minutes with more than 50% of the time spent face-to-face discussing treatment plan and coordination of care.     Orders placed this encounter:  No orders of the defined types were placed in this encounter.     Derek Jack, MD [place] [phone]

## 2018-12-23 ENCOUNTER — Other Ambulatory Visit: Payer: Self-pay | Admitting: Radiation Therapy

## 2018-12-23 DIAGNOSIS — C7931 Secondary malignant neoplasm of brain: Secondary | ICD-10-CM

## 2018-12-25 ENCOUNTER — Other Ambulatory Visit (HOSPITAL_COMMUNITY): Payer: Self-pay | Admitting: *Deleted

## 2018-12-25 MED ORDER — OSIMERTINIB MESYLATE 40 MG PO TABS: 40.0000 mg | ORAL_TABLET | Freq: Every day | ORAL | 0 refills | Status: DC

## 2018-12-30 ENCOUNTER — Other Ambulatory Visit: Payer: Self-pay | Admitting: Internal Medicine

## 2018-12-30 DIAGNOSIS — R6889 Other general symptoms and signs: Secondary | ICD-10-CM | POA: Diagnosis not present

## 2018-12-30 DIAGNOSIS — Z20822 Contact with and (suspected) exposure to covid-19: Secondary | ICD-10-CM

## 2018-12-31 LAB — NOVEL CORONAVIRUS, NAA: SARS-CoV-2, NAA: NOT DETECTED

## 2019-01-06 NOTE — Telephone Encounter (Signed)
Ronney Asters called regarding patient, states that her PEG tube is clogged. Advised to go to the ED.

## 2019-01-12 ENCOUNTER — Other Ambulatory Visit: Payer: Self-pay

## 2019-01-12 ENCOUNTER — Telehealth (HOSPITAL_COMMUNITY): Payer: Self-pay | Admitting: Pharmacy Technician

## 2019-01-12 ENCOUNTER — Inpatient Hospital Stay (HOSPITAL_COMMUNITY): Payer: Medicare HMO

## 2019-01-12 ENCOUNTER — Inpatient Hospital Stay (HOSPITAL_COMMUNITY): Payer: Medicare HMO | Attending: Hematology | Admitting: Hematology

## 2019-01-12 ENCOUNTER — Encounter (HOSPITAL_COMMUNITY): Payer: Self-pay | Admitting: Hematology

## 2019-01-12 DIAGNOSIS — Z931 Gastrostomy status: Secondary | ICD-10-CM | POA: Insufficient documentation

## 2019-01-12 DIAGNOSIS — R634 Abnormal weight loss: Secondary | ICD-10-CM | POA: Insufficient documentation

## 2019-01-12 DIAGNOSIS — I2699 Other pulmonary embolism without acute cor pulmonale: Secondary | ICD-10-CM | POA: Insufficient documentation

## 2019-01-12 DIAGNOSIS — C7931 Secondary malignant neoplasm of brain: Secondary | ICD-10-CM

## 2019-01-12 DIAGNOSIS — C3491 Malignant neoplasm of unspecified part of right bronchus or lung: Secondary | ICD-10-CM

## 2019-01-12 DIAGNOSIS — Z806 Family history of leukemia: Secondary | ICD-10-CM | POA: Insufficient documentation

## 2019-01-12 DIAGNOSIS — C3492 Malignant neoplasm of unspecified part of left bronchus or lung: Secondary | ICD-10-CM

## 2019-01-12 DIAGNOSIS — Z7901 Long term (current) use of anticoagulants: Secondary | ICD-10-CM | POA: Insufficient documentation

## 2019-01-12 DIAGNOSIS — C3432 Malignant neoplasm of lower lobe, left bronchus or lung: Secondary | ICD-10-CM | POA: Insufficient documentation

## 2019-01-12 LAB — COMPREHENSIVE METABOLIC PANEL
ALT: 46 U/L — ABNORMAL HIGH (ref 0–44)
AST: 34 U/L (ref 15–41)
Albumin: 4 g/dL (ref 3.5–5.0)
Alkaline Phosphatase: 59 U/L (ref 38–126)
Anion gap: 9 (ref 5–15)
BUN: 19 mg/dL (ref 8–23)
CO2: 26 mmol/L (ref 22–32)
Calcium: 9.9 mg/dL (ref 8.9–10.3)
Chloride: 99 mmol/L (ref 98–111)
Creatinine, Ser: 0.81 mg/dL (ref 0.44–1.00)
GFR calc Af Amer: >60 mL/min (ref >60)
GFR calc non Af Amer: >60 mL/min (ref >60)
Glucose, Bld: 66 mg/dL — ABNORMAL LOW (ref 70–99)
Potassium: 4.6 mmol/L (ref 3.5–5.1)
Sodium: 134 mmol/L — ABNORMAL LOW (ref 135–145)
Total Bilirubin: 0.3 mg/dL (ref 0.3–1.2)
Total Protein: 7.4 g/dL (ref 6.5–8.1)

## 2019-01-12 LAB — CBC WITH DIFFERENTIAL/PLATELET
Abs Immature Granulocytes: 0.02 10*3/uL (ref 0.00–0.07)
Basophils Absolute: 0 10*3/uL (ref 0.0–0.1)
Basophils Relative: 1 %
Eosinophils Absolute: 0.5 10*3/uL (ref 0.0–0.5)
Eosinophils Relative: 9 %
HCT: 43.4 % (ref 36.0–46.0)
Hemoglobin: 13.5 g/dL (ref 12.0–15.0)
Immature Granulocytes: 0 %
Lymphocytes Relative: 22 %
Lymphs Abs: 1.2 10*3/uL (ref 0.7–4.0)
MCH: 26.2 pg (ref 26.0–34.0)
MCHC: 31.1 g/dL (ref 30.0–36.0)
MCV: 84.3 fL (ref 80.0–100.0)
Monocytes Absolute: 0.8 10*3/uL (ref 0.1–1.0)
Monocytes Relative: 14 %
Neutro Abs: 3 10*3/uL (ref 1.7–7.7)
Neutrophils Relative %: 54 %
Platelets: 255 10*3/uL (ref 150–400)
RBC: 5.15 MIL/uL — ABNORMAL HIGH (ref 3.87–5.11)
RDW: 16.5 % — ABNORMAL HIGH (ref 11.5–15.5)
WBC: 5.5 10*3/uL (ref 4.0–10.5)
nRBC: 0 % (ref 0.0–0.2)

## 2019-01-12 LAB — MAGNESIUM: Magnesium: 2.2 mg/dL (ref 1.7–2.4)

## 2019-01-12 NOTE — Telephone Encounter (Signed)
Oral Oncology Patient Advocate Encounter  Received notification from [facility] that the existing prior authorization for Tagrisso is due for renewal.  Renewal PA submitted on CoverMyMeds Key [id number] Status is pending  Oral Oncology Clinic will continue to follow.  [place] Patient [place] Phone [phone] Fax [phone] 01/12/2019 12:01 PM

## 2019-01-12 NOTE — Assessment & Plan Note (Addendum)
1.  Metastatic adenocarcinoma of the left lung with multiple brain mets: -Guardant 360 showing EGFR exon 19 deletion.  Patient is a never smoker. - Osimertinib 80 mg daily started on 07/28/2018. -CT of the chest on 11/03/2018 with no evidence of adenopathy.  There is ill-defined soft tissue in the paratracheal fat without discrete measurable lesion.  Visualized portion of the liver, adrenal glands and right kidney are unremarkable. - Osimertinib dose reduced to 40 mg daily around 12/08/2018 due to weight loss and decreased p.o. intake. -Since the dose was reduced, her weight has been stable around 157. - CBC is normal today.  CMP is pending.  We will see her back in 4 weeks for follow-up.  2.  Brain metastasis: -MRI of the brain on 05/30/2018 shows 20 subcentimeter enhancing brain lesions consistent with metastasis, no edema. -WBRT 30 Gy in 10 fractions and left lung 30 Gray in 10 fractions from 07/16/2018 - 07/29/2018 -MRI on 11/01/2018 showed excellent response with only faint enhancement of a few residual subcentimeter left hemisphere lesions.  3.  Recurrent PE: -Most likely secondary to active malignancy.  CT PE protocol on 04/24/2018 shows PE in the right lower lobe pulmonary artery. -CT of the chest with contrast on 07/10/2018 while she was on Eliquis showed bilateral pulmonary emboli, on the left lung new since previous diagnosis. -She was started on Lovenox and is continuing it without any problems.  4.  Weight loss: - She had PEG tube clogged and gastrostomy tube was placed on 12/05/2018. - She is taking about 3 cans of Osmolite daily via gastrostomy tube.  She is drinking 3 cans of boost per day. -She is eating by mouth.  Her weight has been stable in the last 1 month.

## 2019-01-12 NOTE — Patient Instructions (Signed)
[facility] Discharge Instructions  You were seen today by Dr. Katragadda. He went over your recent lab results. He will see you back in 4 weeks for labs and follow up.   Thank you for choosing [facility] at [facility] to provide your oncology and hematology care.  To afford each patient quality time with our provider, please arrive at least 15 minutes before your scheduled appointment time.   If you have a lab appointment with the Cancer Center please come in thru the  Main Entrance and check in at the main information desk  You need to re-schedule your appointment should you arrive 10 or more minutes late.  We strive to give you quality time with our providers, and arriving late affects you and other patients whose appointments are after yours.  Also, if you no show three or more times for appointments you may be dismissed from the clinic at the providers discretion.     Again, thank you for choosing [facility].  Our hope is that these requests will decrease the amount of time that you wait before being seen by our physicians.       _____________________________________________________________  Should you have questions after your visit to [facility], please contact our office at [phone] between the hours of 8:00 a.m. and 4:30 p.m.  Voicemails left after 4:00 p.m. will not be returned until the following business day.  For prescription refill requests, have your pharmacy contact our office and allow 72 hours.    Cancer Center Support Programs:   > Cancer Support Group  2nd Tuesday of the month 1pm-2pm, Journey Room    

## 2019-01-12 NOTE — Progress Notes (Signed)
[name], [place] [address]   CLINIC:  Medical Oncology/Hematology  PCP:  [name], [place] [address] [phone]   REASON FOR VISIT:  Follow-up for metastatic lung cancer.     INTERVAL HISTORY:  [name] 68 y.o. female seen for follow-up of metastatic lung cancer.  Appetite is 50%.  Energy levels are 75%.  She is drinking 3 cans of boost per day.  She is also taking in 3 Osmolite cans via gastrostomy tube.  Denies any fevers, night sweats or weight loss in the last 3 weeks.  Fatigue is stable.  Shortness of breath on exertion is also stable.  Denies any fevers or chills.  Denies any nausea or vomiting or diarrhea.  No bleeding per rectum or melena.   REVIEW OF SYSTEMS:  Review of Systems  Constitutional: Positive for fatigue.  Respiratory: Positive for shortness of breath.   Psychiatric/Behavioral: Positive for sleep disturbance.  All other systems reviewed and are negative.    PAST MEDICAL/SURGICAL HISTORY:  Past Medical History:  Diagnosis Date  . Asthma   . Cancer ([facility]) 04/21/2018   Lung Cancer   . Carpal tunnel syndrome   . Cataract    left eye  . Diabetes mellitus without complication ([place])   . DVT (deep venous thrombosis) ([place])   . Hypertension   . Pneumonia 04/21/2018  . Pulmonary embolism ([facility]) 03/2018   Past Surgical History:  Procedure Laterality Date  . ABDOMINAL HYSTERECTOMY    . biopsy of right breast    . ESOPHAGOGASTRODUODENOSCOPY (EGD) WITH PROPOFOL N/A 09/12/2018   Procedure: ESOPHAGOGASTRODUODENOSCOPY (EGD) WITH PROPOFOL (scope in 1017, scope out 1032);  Surgeon: Virl Cagey, MD;  Location: [facility];  Service: General;  Laterality: N/A;  . FOOT SURGERY    . implant left eye    . INTRAOCULAR LENS INSERTION    . IR GASTROSTOMY TUBE MOD SED  12/05/2018  . PEG PLACEMENT N/A 09/12/2018   Procedure: PERCUTANEOUS ENDOSCOPIC GASTROSTOMY (PEG) PLACEMENT;  Surgeon: Virl Cagey, MD;  Location: [facility];  Service: General;  Laterality: N/A;     SOCIAL HISTORY:  Social History   Socioeconomic History  . Marital status: Widowed    Spouse name: Not on file  . Number of children: 4  . Years of education: 45  . Highest education level: High school graduate  Occupational History  . Not on file  Social Needs  . Financial resource strain: Very hard  . Food insecurity    Worry: Sometimes true    Inability: Sometimes true  . Transportation needs    Medical: No    Non-medical: No  Tobacco Use  . Smoking status: Never Smoker  . Smokeless tobacco: Never Used  Substance and Sexual Activity  . Alcohol use: Not Currently    Comment: occ  . Drug use: No  . Sexual activity: Yes    Partners: Male    Birth control/protection: Surgical  Lifestyle  . Physical activity    Days per week: 0 days    Minutes per session: Not on file  . Stress: Only a little  Relationships  . Social connections    Talks on phone: More than three times a week    Gets together: More than three times a week    Attends religious service: More than 4 times per year    Active member of club or organization: Yes    Attends meetings of  clubs or organizations: More than 4 times per year    Relationship status: Widowed  . Intimate partner violence    Fear of current or ex partner: No    Emotionally abused: No    Physically abused: No    Forced sexual activity: No  Other Topics Concern  . 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    FAMILY HISTORY:  Family History  Problem Relation Age of Onset  . Heart failure Mother   . Stroke Mother   . Heart attack Mother   . Kidney failure Other   . Leukemia Father     CURRENT MEDICATIONS:  Outpatient Encounter Medications as  of 01/12/2019  Medication Sig Note  . albuterol (PROVENTIL HFA;VENTOLIN HFA) 108 (90 BASE) MCG/ACT inhaler Inhale 2 puffs into the lungs every 6 (six) hours as needed for wheezing.   Marland Kitchen albuterol (PROVENTIL) (2.5 MG/3ML) 0.083% nebulizer solution Take 3 mLs (2.5 mg total) by nebulization every 6 (six) hours as needed for wheezing or shortness of breath.   Marland Kitchen amLODipine (NORVASC) 5 MG tablet Take 5 mg by mouth daily.   . colchicine 0.6 MG tablet Take 1 tablet (0.6 mg total) by mouth daily. 12/02/2018: Not consistent in taking daily  . enoxaparin (LOVENOX) 150 MG/ML injection Inject 1 mL (150 mg total) into the skin daily.   Marland Kitchen HYDROcodone-acetaminophen (NORCO/VICODIN) 5-325 MG tablet    . lactose free nutrition (BOOST) LIQD Take 237 mLs by mouth 4 (four) times daily.   Marland Kitchen LORazepam (ATIVAN) 0.5 MG tablet Take 0.5 mg by mouth at bedtime as needed for anxiety.   . metoCLOPramide (REGLAN) 10 MG tablet Take 1 tablet (10 mg total) by mouth every 8 (eight) hours as needed for refractory nausea / vomiting. (Patient taking differently: Take 10 mg by mouth daily as needed for nausea or vomiting. )   . ondansetron (ZOFRAN ODT) 4 MG disintegrating tablet Take 1 tablet (4 mg total) by mouth every 8 (eight) hours as needed for nausea or vomiting.   Marland Kitchen osimertinib mesylate (TAGRISSO) 40 MG tablet Take 1 tablet (40 mg total) by mouth daily.   . pantoprazole (PROTONIX) 40 MG tablet TAKE 1 TABLET BY MOUTH ONCE DAILY. (Patient taking differently: Take 40 mg by mouth daily. )   . potassium chloride (K-DUR) 10 MEQ tablet Take 2 tablets (20 mEq total) by mouth 2 (two) times daily.   . prochlorperazine (COMPAZINE) 10 MG tablet TAKE (1) TABLET BY MOUTH EVERY SIX HOURS AS NEEDED FOR NAUSEA OR VOMITING. (Patient taking differently: Take 10 mg by mouth every 6 (six) hours as needed for nausea or vomiting. )   . Water For Irrigation, Sterile (FREE WATER) SOLN Place 200 mLs into feeding tube every 6 (six) hours.   Marland Kitchen zolpidem (AMBIEN  CR) 6.25 MG CR tablet Take 1 tablet (6.25 mg total) by mouth at bedtime as needed for sleep. (Patient taking differently: Take 6.25 mg by mouth at bedtime. )   . acetaminophen (TYLENOL) 325 MG tablet Take 2 tablets (650 mg total) by mouth every 6 (six) hours as needed for mild pain (or Fever >/= 101). (Patient not taking: Reported on 01/12/2019)   . ibuprofen (ADVIL) 200 MG tablet Take 200 mg by mouth every 6 (six)  hours as needed for mild pain or moderate pain.    No facility-administered encounter medications on file as of 01/12/2019.     ALLERGIES:  Allergies  Allergen Reactions  . Banana Anaphylaxis  . Aspirin Other (See Comments)    G.I. Upset      PHYSICAL EXAM:  ECOG Performance status: 1  Vitals:   01/12/19 1515  BP: 122/86  Pulse: 93  Resp: 18  Temp: (!) 97.1 F (36.2 C)  SpO2: 100%   Filed Weights   01/12/19 1515  Weight: 157 lb 8 oz (71.4 kg)    Physical Exam Vitals signs reviewed.  Constitutional:      Appearance: Normal appearance.  Cardiovascular:     Rate and Rhythm: Normal rate and regular rhythm.     Heart sounds: Normal heart sounds.  Pulmonary:     Effort: Pulmonary effort is normal.     Breath sounds: Normal breath sounds.  Abdominal:     General: There is no distension.     Palpations: Abdomen is soft. There is no mass.  Musculoskeletal:        General: No swelling.  Skin:    General: Skin is warm.  Neurological:     General: No focal deficit present.     Mental Status: She is alert and oriented to person, place, and time.  Psychiatric:        Mood and Affect: Mood normal.        Behavior: Behavior normal.      LABORATORY DATA:  I have reviewed the labs as listed.  CBC    Component Value Date/Time   WBC 5.5 01/12/2019 1430   RBC 5.15 (H) 01/12/2019 1430   HGB 13.5 01/12/2019 1430   HCT 43.4 01/12/2019 1430   PLT 255 01/12/2019 1430   MCV 84.3 01/12/2019 1430   MCH 26.2 01/12/2019 1430   MCHC 31.1 01/12/2019 1430   RDW 16.5  (H) 01/12/2019 1430   LYMPHSABS 1.2 01/12/2019 1430   MONOABS 0.8 01/12/2019 1430   EOSABS 0.5 01/12/2019 1430   BASOSABS 0.0 01/12/2019 1430   CMP Latest Ref Rng & Units 12/22/2018 12/04/2018 12/01/2018  Glucose 70 - 99 mg/dL 88 85 99  BUN 8 - 23 mg/dL '14 13 17  '$ Creatinine 0.44 - 1.00 mg/dL 0.86 0.78 0.87  Sodium 135 - 145 mmol/L 138 137 138  Potassium 3.5 - 5.1 mmol/L 4.4 4.4 4.1  Chloride 98 - 111 mmol/L 106 102 104  CO2 22 - 32 mmol/L '24 23 25  '$ Calcium 8.9 - 10.3 mg/dL 10.0 9.3 9.9  Total Protein 6.5 - 8.1 g/dL 7.6 - 7.6  Total Bilirubin 0.3 - 1.2 mg/dL 0.2(L) - 0.5  Alkaline Phos 38 - 126 U/L 61 - 64  AST 15 - 41 U/L 29 - 25  ALT 0 - 44 U/L 43 - 41       DIAGNOSTIC IMAGING:  I have independently reviewed the scans and discussed with the patient.      ASSESSMENT & PLAN:   Metastatic lung cancer (metastasis from lung to other site), left ([place]) 1.  Metastatic adenocarcinoma of the left lung with multiple brain mets: -Guardant 360 showing EGFR exon 19 deletion.  Patient is a never smoker. - Osimertinib 80 mg daily started on 07/28/2018. -CT of the chest on 11/03/2018 with no evidence of adenopathy.  There is ill-defined soft tissue in the paratracheal fat without discrete measurable lesion.  Visualized portion of the liver, adrenal glands and right kidney  are unremarkable. - Osimertinib dose reduced to 40 mg daily around 12/08/2018 due to weight loss and decreased p.o. intake. -Since the dose was reduced, her weight has been stable around 157. - CBC is normal today.  CMP is pending.  We will see her back in 4 weeks for follow-up.  2.  Brain metastasis: -MRI of the brain on 05/30/2018 shows 20 subcentimeter enhancing brain lesions consistent with metastasis, no edema. -WBRT 30 Gy in 10 fractions and left lung 30 Gray in 10 fractions from 07/16/2018 - 07/29/2018 -MRI on 11/01/2018 showed excellent response with only faint enhancement of a few residual subcentimeter left hemisphere  lesions.  3.  Recurrent PE: -Most likely secondary to active malignancy.  CT PE protocol on 04/24/2018 shows PE in the right lower lobe pulmonary artery. -CT of the chest with contrast on 07/10/2018 while she was on Eliquis showed bilateral pulmonary emboli, on the left lung new since previous diagnosis. -She was started on Lovenox and is continuing it without any problems.  4.  Weight loss: - She had PEG tube clogged and gastrostomy tube was placed on 12/05/2018. - She is taking about 3 cans of Osmolite daily via gastrostomy tube.  She is drinking 3 cans of boost per day. -She is eating by mouth.  Her weight has been stable in the last 1 month.  Total time spent is 25 minutes with more than 50% of the time spent face-to-face discussing treatment plan, counseling and coordination of care.     Orders placed this encounter:  No orders of the defined types were placed in this encounter.     Derek Jack, MD [place] [phone]

## 2019-01-13 NOTE — Telephone Encounter (Signed)
Oral Oncology Patient Advocate Encounter  Received notification from [facility] that the request for prior authorization for Tagrisso has been denied because drugs covered under a patient assistance program cannot be covered under a part D plan.    Patient is currently receiving Tagrisso from [employer].  [employer]eds sent a fax requesting a PA since the one the patient had expired on 01/11/2019.   No further action needed.  [facility] Patient [place] Phone [phone] Fax [phone] 01/13/2019 8:12 AM

## 2019-01-16 ENCOUNTER — Telehealth (HOSPITAL_COMMUNITY): Payer: Self-pay

## 2019-01-16 NOTE — Telephone Encounter (Signed)
Nutrition Follow-up:  Patient with stage IV adenocarcinoma of lung with metastatic disease to brain.  Patient taking oral chemotherapy.  S/p PEG replacement on 7/10.    Spoke with patient via phone for nutrition follow-up.  Patient reports that she is taking 3-4 osmolite 1.5 via tube daily and drinking 3-4 boost daily (unsure calorie amount).    Patient reports that her appetite is good.  Reports ate grits at breakfast this am and chicken wings for lunch.  Had toss salad and small piece of steak for dinner last night.  Reports that she mainly drinks water, gatorade and orange juice.  Reports normal bowel movement.      Medications: reviewed  Labs: reviewed  Anthropometrics:   Weight 157 lb on 8/17, same on 7/27.   Noted 164 lb on 7/10 but question accuracy due to hospital weight.     Estimated Energy Needs  Kcals: 1950-2150 Protein: 93-108 g Fluid: 2-2.2 L fluid  NUTRITION DIAGNOSIS: Increased nutrient needs related to cancer continue   INTERVENTION:  Patient to continue osmolite 1.5, 3 cartons per day via feeding tube.  Continue boost shakes 3 times per day orally and eating high calorie, high protein foods.  Contact information provided    MONITORING, EVALUATION, GOAL: weight trend, TF, intake   NEXT VISIT: phone f/u Sept 25  Cloma Rahrig B. Zenia Resides, [place], [place] Registered Dietitian [phone] (pager)

## 2019-01-29 DIAGNOSIS — C3490 Malignant neoplasm of unspecified part of unspecified bronchus or lung: Secondary | ICD-10-CM | POA: Diagnosis not present

## 2019-01-29 DIAGNOSIS — M25511 Pain in right shoulder: Secondary | ICD-10-CM | POA: Diagnosis not present

## 2019-01-29 DIAGNOSIS — I2699 Other pulmonary embolism without acute cor pulmonale: Secondary | ICD-10-CM | POA: Diagnosis not present

## 2019-02-06 ENCOUNTER — Other Ambulatory Visit: Payer: Self-pay | Admitting: Radiation Therapy

## 2019-02-06 ENCOUNTER — Ambulatory Visit (HOSPITAL_COMMUNITY): Admission: RE | Admit: 2019-02-06 | Payer: Medicare HMO | Source: Ambulatory Visit

## 2019-02-09 ENCOUNTER — Other Ambulatory Visit (HOSPITAL_COMMUNITY): Payer: Self-pay | Admitting: *Deleted

## 2019-02-09 ENCOUNTER — Inpatient Hospital Stay (HOSPITAL_COMMUNITY): Payer: Medicare HMO

## 2019-02-09 ENCOUNTER — Ambulatory Visit (HOSPITAL_COMMUNITY): Payer: Medicare HMO | Admitting: Hematology

## 2019-02-09 MED ORDER — OSIMERTINIB MESYLATE 40 MG PO TABS: 40.0000 mg | ORAL_TABLET | Freq: Every day | ORAL | 0 refills | Status: DC

## 2019-02-09 NOTE — Telephone Encounter (Signed)
Chart reviewed Tagrisso refilled.

## 2019-02-11 ENCOUNTER — Other Ambulatory Visit (HOSPITAL_COMMUNITY): Payer: Self-pay | Admitting: *Deleted

## 2019-02-11 ENCOUNTER — Telehealth: Payer: Medicare HMO | Admitting: Urology

## 2019-02-11 DIAGNOSIS — C3492 Malignant neoplasm of unspecified part of left bronchus or lung: Secondary | ICD-10-CM

## 2019-02-13 ENCOUNTER — Other Ambulatory Visit: Payer: Self-pay | Admitting: Radiation Therapy

## 2019-02-18 ENCOUNTER — Other Ambulatory Visit (HOSPITAL_COMMUNITY): Payer: Self-pay | Admitting: *Deleted

## 2019-02-18 ENCOUNTER — Ambulatory Visit (HOSPITAL_COMMUNITY): Admission: RE | Admit: 2019-02-18 | Payer: Medicare HMO | Source: Ambulatory Visit

## 2019-02-18 DIAGNOSIS — C3492 Malignant neoplasm of unspecified part of left bronchus or lung: Secondary | ICD-10-CM

## 2019-02-19 ENCOUNTER — Encounter (HOSPITAL_COMMUNITY): Payer: Self-pay | Admitting: Hematology

## 2019-02-19 ENCOUNTER — Inpatient Hospital Stay (HOSPITAL_COMMUNITY): Payer: Medicare HMO | Attending: Hematology

## 2019-02-19 ENCOUNTER — Inpatient Hospital Stay (HOSPITAL_BASED_OUTPATIENT_CLINIC_OR_DEPARTMENT_OTHER): Payer: Medicare HMO | Admitting: Hematology

## 2019-02-19 ENCOUNTER — Ambulatory Visit (HOSPITAL_COMMUNITY)
Admission: RE | Admit: 2019-02-19 | Discharge: 2019-02-19 | Disposition: A | Discharge status: Home / Self Care | Payer: Medicare HMO | Source: Ambulatory Visit | Attending: Hematology | Admitting: Hematology

## 2019-02-19 ENCOUNTER — Other Ambulatory Visit: Payer: Self-pay

## 2019-02-19 VITALS — BP 132/92 | HR 96 | Temp 98.9°F | Resp 18 | Wt 161.0 lb

## 2019-02-19 DIAGNOSIS — R634 Abnormal weight loss: Secondary | ICD-10-CM | POA: Diagnosis not present

## 2019-02-19 DIAGNOSIS — C3491 Malignant neoplasm of unspecified part of right bronchus or lung: Secondary | ICD-10-CM

## 2019-02-19 DIAGNOSIS — C3492 Malignant neoplasm of unspecified part of left bronchus or lung: Secondary | ICD-10-CM

## 2019-02-19 DIAGNOSIS — Z7901 Long term (current) use of anticoagulants: Secondary | ICD-10-CM | POA: Diagnosis not present

## 2019-02-19 DIAGNOSIS — I2699 Other pulmonary embolism without acute cor pulmonale: Secondary | ICD-10-CM | POA: Insufficient documentation

## 2019-02-19 DIAGNOSIS — C3490 Malignant neoplasm of unspecified part of unspecified bronchus or lung: Secondary | ICD-10-CM | POA: Diagnosis not present

## 2019-02-19 DIAGNOSIS — C7931 Secondary malignant neoplasm of brain: Secondary | ICD-10-CM | POA: Insufficient documentation

## 2019-02-19 LAB — MAGNESIUM: Magnesium: 2 mg/dL (ref 1.7–2.4)

## 2019-02-19 LAB — CBC WITH DIFFERENTIAL/PLATELET
Abs Immature Granulocytes: 0.01 10*3/uL (ref 0.00–0.07)
Basophils Absolute: 0 10*3/uL (ref 0.0–0.1)
Basophils Relative: 1 %
Eosinophils Absolute: 0.4 10*3/uL (ref 0.0–0.5)
Eosinophils Relative: 9 %
HCT: 43.7 % (ref 36.0–46.0)
Hemoglobin: 13.6 g/dL (ref 12.0–15.0)
Immature Granulocytes: 0 %
Lymphocytes Relative: 22 %
Lymphs Abs: 1 10*3/uL (ref 0.7–4.0)
MCH: 27 pg (ref 26.0–34.0)
MCHC: 31.1 g/dL (ref 30.0–36.0)
MCV: 86.9 fL (ref 80.0–100.0)
Monocytes Absolute: 0.6 10*3/uL (ref 0.1–1.0)
Monocytes Relative: 13 %
Neutro Abs: 2.7 10*3/uL (ref 1.7–7.7)
Neutrophils Relative %: 55 %
Platelets: 225 10*3/uL (ref 150–400)
RBC: 5.03 MIL/uL (ref 3.87–5.11)
RDW: 16.2 % — ABNORMAL HIGH (ref 11.5–15.5)
WBC: 4.7 10*3/uL (ref 4.0–10.5)
nRBC: 0 % (ref 0.0–0.2)

## 2019-02-19 LAB — COMPREHENSIVE METABOLIC PANEL
ALT: 28 U/L (ref 0–44)
AST: 21 U/L (ref 15–41)
Albumin: 3.9 g/dL (ref 3.5–5.0)
Alkaline Phosphatase: 63 U/L (ref 38–126)
Anion gap: 9 (ref 5–15)
BUN: 15 mg/dL (ref 8–23)
CO2: 25 mmol/L (ref 22–32)
Calcium: 9.6 mg/dL (ref 8.9–10.3)
Chloride: 103 mmol/L (ref 98–111)
Creatinine, Ser: 1.16 mg/dL — ABNORMAL HIGH (ref 0.44–1.00)
GFR calc Af Amer: 56 mL/min — ABNORMAL LOW (ref >60)
GFR calc non Af Amer: 48 mL/min — ABNORMAL LOW (ref >60)
Glucose, Bld: 98 mg/dL (ref 70–99)
Potassium: 4 mmol/L (ref 3.5–5.1)
Sodium: 137 mmol/L (ref 135–145)
Total Bilirubin: 0.1 mg/dL — ABNORMAL LOW (ref 0.3–1.2)
Total Protein: 7.5 g/dL (ref 6.5–8.1)

## 2019-02-19 MED ORDER — GADOBUTROL 1 MMOL/ML IV SOLN
7.5000 mL | Freq: Once | INTRAVENOUS | Status: AC | PRN
  Administered 2019-02-19: 10:00:00 7.5 mL via INTRAVENOUS

## 2019-02-19 NOTE — Progress Notes (Signed)
Kaitlin [name], [place] [address]   CLINIC:  Medical Oncology/Hematology  PCP:  [name], [place]  [address] [phone]   REASON FOR VISIT:  Follow-up for metastatic lung cancer.     INTERVAL HISTORY:  Kaitlin [name] 68 y.o. female seen for follow-up of metastatic lung cancer to the brain.  Appetite is 75%.  Energy levels are 100%.  She complains of pain in the right shoulder.  She apparently played softball for 20 years.  She thinks it is rotator cuff.  She has minor shortness of breath on exertion which is also stable.  Denies any nausea, vomiting or diarrhea or constipation.  She is taking 3 cans of tube feeds daily.  She is also drinking 3 cans of boost per day and eating very well.  She gained 4-5 pounds since last visit.  REVIEW OF SYSTEMS:  Review of Systems  Respiratory: Positive for shortness of breath.   Psychiatric/Behavioral: Positive for sleep disturbance.  All other systems reviewed and are negative.    PAST MEDICAL/SURGICAL HISTORY:  Past Medical History:  Diagnosis Date  . Asthma   . Cancer ([place]) 04/21/2018   Lung Cancer   . Carpal tunnel syndrome   . Cataract    left eye  . Diabetes mellitus without complication ([facility])   . DVT (deep venous thrombosis) ([place])   . Hypertension   . Pneumonia 04/21/2018  . Pulmonary embolism ([place]) 03/2018   Past Surgical History:  Procedure Laterality Date  . ABDOMINAL HYSTERECTOMY    . biopsy of right breast    . ESOPHAGOGASTRODUODENOSCOPY (EGD) WITH PROPOFOL N/A 09/12/2018   Procedure: ESOPHAGOGASTRODUODENOSCOPY (EGD) WITH PROPOFOL (scope in 1017, scope out 1032);  Surgeon: Virl Cagey, MD;  Location: [facility];  Service: General;  Laterality: N/A;  . FOOT SURGERY    . implant left eye    . INTRAOCULAR LENS INSERTION    . IR GASTROSTOMY TUBE MOD SED  12/05/2018  . PEG PLACEMENT N/A 09/12/2018   Procedure: PERCUTANEOUS ENDOSCOPIC GASTROSTOMY (PEG)  PLACEMENT;  Surgeon: Virl Cagey, MD;  Location: [facility];  Service: General;  Laterality: N/A;     SOCIAL HISTORY:  Social History   Socioeconomic History  . Marital status: Widowed    Spouse name: Not on file  . Number of children: 4  . Years of education: 20  . Highest education level: High school graduate  Occupational History  . Not on file  Social Needs  . Financial resource strain: Very hard  . Food insecurity    Worry: Sometimes true    Inability: Sometimes true  . Transportation needs    Medical: No    Non-medical: No  Tobacco Use  . Smoking status: Never Smoker  . Smokeless tobacco: Never Used  Substance and Sexual Activity  . Alcohol use: Not Currently    Comment: occ  . Drug use: No  . Sexual activity: Yes    Partners: Male    Birth control/protection: Surgical  Lifestyle  . Physical activity    Days per week: 0 days    Minutes per session: Not on file  . Stress: Only a little  Relationships  . Social connections    Talks on phone: More than three times a week    Gets together: More than three times a week    Attends religious service: More than 4 times per year    Active member of club or organization: Yes  Attends meetings of clubs or organizations: More than 4 times per year    Relationship status: Widowed  . Intimate partner violence    Fear of current or ex partner: No    Emotionally abused: No    Physically abused: No    Forced sexual activity: No  Other Topics Concern  . 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    FAMILY HISTORY:  Family History  Problem Relation Age of Onset  . Heart failure Mother   . Stroke Mother   . Heart attack Mother   . Kidney failure Other   . Leukemia Father     CURRENT MEDICATIONS:   Outpatient Encounter Medications as of 02/19/2019  Medication Sig Note  . amLODipine (NORVASC) 5 MG tablet Take 5 mg by mouth daily.   Marland Kitchen enoxaparin (LOVENOX) 150 MG/ML injection Inject 1 mL (150 mg total) into the skin daily.   Marland Kitchen lactose free nutrition (BOOST) LIQD Take 237 mLs by mouth 4 (four) times daily.   . potassium chloride (K-DUR) 10 MEQ tablet Take 2 tablets (20 mEq total) by mouth 2 (two) times daily.   . Water For Irrigation, Sterile (FREE WATER) SOLN Place 200 mLs into feeding tube every 6 (six) hours.   Marland Kitchen zolpidem (AMBIEN CR) 6.25 MG CR tablet Take 1 tablet (6.25 mg total) by mouth at bedtime as needed for sleep. (Patient taking differently: Take 6.25 mg by mouth at bedtime. )   . [DISCONTINUED] pantoprazole (PROTONIX) 40 MG tablet TAKE 1 TABLET BY MOUTH ONCE DAILY. (Patient taking differently: Take 40 mg by mouth daily. )   . albuterol (PROVENTIL HFA;VENTOLIN HFA) 108 (90 BASE) MCG/ACT inhaler Inhale 2 puffs into the lungs every 6 (six) hours as needed for wheezing.   Marland Kitchen albuterol (PROVENTIL) (2.5 MG/3ML) 0.083% nebulizer solution Take 3 mLs (2.5 mg total) by nebulization every 6 (six) hours as needed for wheezing or shortness of breath. (Patient not taking: Reported on 02/19/2019)   . colchicine 0.6 MG tablet Take 1 tablet (0.6 mg total) by mouth daily. (Patient not taking: Reported on 02/19/2019) 12/02/2018: Not consistent in taking daily  . HYDROcodone-acetaminophen (NORCO/VICODIN) 5-325 MG tablet    . LORazepam (ATIVAN) 0.5 MG tablet Take 0.5 mg by mouth at bedtime as needed for anxiety.   . metoCLOPramide (REGLAN) 10 MG tablet Take 1 tablet (10 mg total) by mouth every 8 (eight) hours as needed for refractory nausea / vomiting. (Patient not taking: Reported on 02/19/2019)   . prochlorperazine (COMPAZINE) 10 MG tablet TAKE (1) TABLET BY MOUTH EVERY SIX HOURS AS NEEDED FOR NAUSEA OR VOMITING. (Patient not taking: No sig reported)   . [DISCONTINUED] acetaminophen (TYLENOL) 325 MG tablet  Take 2 tablets (650 mg total) by mouth every 6 (six) hours as needed for mild pain (or Fever >/= 101). (Patient not taking: Reported on 01/12/2019)   . [DISCONTINUED] ibuprofen (ADVIL) 200 MG tablet Take 200 mg by mouth every 6 (six) hours as needed for mild pain or moderate pain.   . [DISCONTINUED] ondansetron (ZOFRAN ODT) 4 MG disintegrating tablet Take 1 tablet (4 mg total) by mouth every 8 (eight) hours as needed for nausea or vomiting.   . [DISCONTINUED] osimertinib mesylate (TAGRISSO) 40 MG tablet Take 1 tablet (  40 mg total) by mouth daily.    No facility-administered encounter medications on file as of 02/19/2019.     ALLERGIES:  Allergies  Allergen Reactions  . Banana Anaphylaxis  . Aspirin Other (See Comments)    G.I. Upset      PHYSICAL EXAM:  ECOG Performance status: 1  Vitals:   02/19/19 1443  BP: (!) 132/92  Pulse: 96  Resp: 18  Temp: 98.9 F (37.2 C)  SpO2: 99%   Filed Weights   02/19/19 1443  Weight: 161 lb (73 kg)    Physical Exam Vitals signs reviewed.  Constitutional:      Appearance: Normal appearance.  Cardiovascular:     Rate and Rhythm: Normal rate and regular rhythm.     Heart sounds: Normal heart sounds.  Pulmonary:     Effort: Pulmonary effort is normal.     Breath sounds: Normal breath sounds.  Abdominal:     General: There is no distension.     Palpations: Abdomen is soft. There is no mass.  Musculoskeletal:        General: No swelling.  Skin:    General: Skin is warm.  Neurological:     General: No focal deficit present.     Mental Status: She is alert and oriented to person, place, and time.  Psychiatric:        Mood and Affect: Mood normal.        Behavior: Behavior normal.      LABORATORY DATA:  I have reviewed the labs as listed.  CBC    Component Value Date/Time   WBC 4.7 02/19/2019 1405   RBC 5.03 02/19/2019 1405   HGB 13.6 02/19/2019 1405   HCT 43.7 02/19/2019 1405   PLT 225 02/19/2019 1405   MCV 86.9 02/19/2019  1405   MCH 27.0 02/19/2019 1405   MCHC 31.1 02/19/2019 1405   RDW 16.2 (H) 02/19/2019 1405   LYMPHSABS 1.0 02/19/2019 1405   MONOABS 0.6 02/19/2019 1405   EOSABS 0.4 02/19/2019 1405   BASOSABS 0.0 02/19/2019 1405   CMP Latest Ref Rng & Units 02/19/2019 01/12/2019 12/22/2018  Glucose 70 - 99 mg/dL 98 66(L) 88  BUN 8 - 23 mg/dL '15 19 14  '$ Creatinine 0.44 - 1.00 mg/dL 1.16(H) 0.81 0.86  Sodium 135 - 145 mmol/L 137 134(L) 138  Potassium 3.5 - 5.1 mmol/L 4.0 4.6 4.4  Chloride 98 - 111 mmol/L 103 99 106  CO2 22 - 32 mmol/L '25 26 24  '$ Calcium 8.9 - 10.3 mg/dL 9.6 9.9 10.0  Total Protein 6.5 - 8.1 g/dL 7.5 7.4 7.6  Total Bilirubin 0.3 - 1.2 mg/dL 0.1(L) 0.3 0.2(L)  Alkaline Phos 38 - 126 U/L 63 59 61  AST 15 - 41 U/L 21 34 29  ALT 0 - 44 U/L 28 46(H) 43       DIAGNOSTIC IMAGING:  I have independently reviewed the scans and discussed with the patient.      ASSESSMENT & PLAN:   Metastatic lung cancer (metastasis from lung to other site), left ([place]) 1.  Metastatic adenocarcinoma of the left lung with multiple brain mets: -Guardant 360 showing EGFR exon 19 deletion.  Patient is a never smoker. - Osimertinib 80 mg daily started on 07/28/2018. -CT of the chest on 11/03/2018 with no evidence of adenopathy.  There is ill-defined soft tissue in the paratracheal fat without discrete measurable lesion.  Visualized portion of the liver, adrenal glands and right kidney are unremarkable. - Osimertinib dose reduced  to 40 mg daily around 12/08/2026 due to weight loss and decreased p.o. intake. -Since then she is consistently gaining the weight back.  Her energy levels also improved. - We reviewed her labs which are grossly stable. -I will see her back in 4 weeks and repeat a CT scan of the chest with contrast.  2.  Brain metastasis: -MRI of the brain on 05/30/2018 shows 20 subcentimeter enhancing brain lesions consistent with metastasis, no edema. -WBRT 30 Gy in 10 fractions and left lung 30 Gray in 10  fractions from 07/16/2018 - 07/29/2018 -MRI on 11/01/2018 showed excellent response with only faint enhancement of a few residual subcentimeter left hemisphere lesions. - She underwent MRI of the brain this morning.  We will follow-up on the results.  3.  Recurrent PE: -Most likely secondary to active malignancy.  CT PE protocol on 04/24/2018 shows PE in the right lower lobe pulmonary artery. -CT of the chest with contrast on 07/10/2018 while she was on Eliquis showed bilateral pulmonary emboli, on the left lung new since previous diagnosis. -She is taking Lovenox without any problems.  4.  Weight loss: - She is taking in 3 cans of Osmolite via gastrostomy tube.  She is also drinking 3 cans of boost per day. -She is eating by mouth.  She has gained 4 pounds since last visit.   Total time spent is 25 minutes with more than 50% of the time spent face-to-face discussing treatment plan, counseling and coordination of care.     Orders placed this encounter:  Orders Placed This Encounter  Procedures  . CT Chest W Contrast      Derek Jack, MD [place] [phone]

## 2019-02-19 NOTE — Assessment & Plan Note (Signed)
1.  Metastatic adenocarcinoma of the left lung with multiple brain mets: -Guardant 360 showing EGFR exon 19 deletion.  Patient is a never smoker. - Osimertinib 80 mg daily started on 07/28/2018. -CT of the chest on 11/03/2018 with no evidence of adenopathy.  There is ill-defined soft tissue in the paratracheal fat without discrete measurable lesion.  Visualized portion of the liver, adrenal glands and right kidney are unremarkable. - Osimertinib dose reduced to 40 mg daily around 12/08/2026 due to weight loss and decreased p.o. intake. -Since then she is consistently gaining the weight back.  Her energy levels also improved. - We reviewed her labs which are grossly stable. -I will see her back in 4 weeks and repeat a CT scan of the chest with contrast.  2.  Brain metastasis: -MRI of the brain on 05/30/2018 shows 20 subcentimeter enhancing brain lesions consistent with metastasis, no edema. -WBRT 30 Gy in 10 fractions and left lung 30 Gray in 10 fractions from 07/16/2018 - 07/29/2018 -MRI on 11/01/2018 showed excellent response with only faint enhancement of a few residual subcentimeter left hemisphere lesions. - She underwent MRI of the brain this morning.  We will follow-up on the results.  3.  Recurrent PE: -Most likely secondary to active malignancy.  CT PE protocol on 04/24/2018 shows PE in the right lower lobe pulmonary artery. -CT of the chest with contrast on 07/10/2018 while she was on Eliquis showed bilateral pulmonary emboli, on the left lung new since previous diagnosis. -She is taking Lovenox without any problems.  4.  Weight loss: - She is taking in 3 cans of Osmolite via gastrostomy tube.  She is also drinking 3 cans of boost per day. -She is eating by mouth.  She has gained 4 pounds since last visit.

## 2019-02-19 NOTE — Patient Instructions (Signed)
[facility] at [facility] Discharge Instructions  You were seen today by Dr. Katragadda. He went over your recent lab results. He will see you back in 4 weeks for labs and follow up.   Thank you for choosing [facility] at [facility] to provide your oncology and hematology care.  To afford each patient quality time with our provider, please arrive at least 15 minutes before your scheduled appointment time.   If you have a lab appointment with the Cancer Center please come in thru the  Main Entrance and check in at the main information desk  You need to re-schedule your appointment should you arrive 10 or more minutes late.  We strive to give you quality time with our providers, and arriving late affects you and other patients whose appointments are after yours.  Also, if you no show three or more times for appointments you may be dismissed from the clinic at the providers discretion.     Again, thank you for choosing [facility].  Our hope is that these requests will decrease the amount of time that you wait before being seen by our physicians.       _____________________________________________________________  Should you have questions after your visit to [facility], please contact our office at [phone] between the hours of 8:00 a.m. and 4:30 p.m.  Voicemails left after 4:00 p.m. will not be returned until the following business day.  For prescription refill requests, have your pharmacy contact our office and allow 72 hours.    Cancer Center Support Programs:   > Cancer Support Group  2nd Tuesday of the month 1pm-2pm, Journey Room    

## 2019-02-20 ENCOUNTER — Inpatient Hospital Stay (HOSPITAL_COMMUNITY): Payer: Medicare HMO

## 2019-02-20 NOTE — Progress Notes (Signed)
Nutrition Follow-up:  Patient with stage IV adenocarcinoma of lung with metastatic disease to brain.  Patient on osimertinib with dose reduction.    Spoke with patient via phone for nutrition follow-up.  Patient continues to tolerate 3 osmolite 1.5 via feeding tube.  Reports that she is eating more orally (chicken wings, cabbage, pinto beans, cornbread, grits).  Drinks 3 boost shakes most days, water and gatrorade.  Denies nausea or issues with bowel movements.  Typically has bowel movement 1 time per day.     Medications: reviewed  Labs: reviewed, creatinine 1.16  Anthropometrics:   Weight has increased to 161 lb (9/24) from 157 lb on 8/17   Estimated Energy Needs  Kcals: 1950-2150 Protein: 93-108 g Fluid: 2-2.2 L  NUTRITION DIAGNOSIS: Increased nutrient needs continue   INTERVENTION:  Patient to continue osmolite 1.5, 3 cartons per day via feeding tube. Discussed with patient that she can increase water flush to 2 syringes before (170ml) and 2 syringes after each feeding to increase hydration or drink more orally. Encouraged her to continue to eat high calorie, high protein foods orally to maintain weight Patient requesting samples of boost strawberry.  Will leave 6 pack at registration desk for patient to pick up next week along with coupons.     MONITORING, EVALUATION, GOAL: weight trends, intake, TF tolerance   NEXT VISIT: phone f/u October 30th  Pruitt Taboada B. Zenia Resides, [place], [place] Registered Dietitian [phone] (pager)

## 2019-02-23 DIAGNOSIS — Z23 Encounter for immunization: Secondary | ICD-10-CM | POA: Diagnosis not present

## 2019-02-24 ENCOUNTER — Other Ambulatory Visit (HOSPITAL_COMMUNITY): Payer: Self-pay | Admitting: Nurse Practitioner

## 2019-02-25 ENCOUNTER — Inpatient Hospital Stay: Payer: Medicare HMO | Attending: Radiation Oncology

## 2019-02-25 ENCOUNTER — Ambulatory Visit
Admission: RE | Admit: 2019-02-25 | Discharge: 2019-02-25 | Disposition: A | Discharge status: Home / Self Care | Payer: Medicare HMO | Source: Ambulatory Visit | Attending: Urology | Admitting: Urology

## 2019-02-25 DIAGNOSIS — C7931 Secondary malignant neoplasm of brain: Secondary | ICD-10-CM

## 2019-02-25 DIAGNOSIS — C3492 Malignant neoplasm of unspecified part of left bronchus or lung: Secondary | ICD-10-CM

## 2019-02-25 DIAGNOSIS — Z08 Encounter for follow-up examination after completed treatment for malignant neoplasm: Secondary | ICD-10-CM | POA: Diagnosis not present

## 2019-02-25 DIAGNOSIS — C3432 Malignant neoplasm of lower lobe, left bronchus or lung: Secondary | ICD-10-CM | POA: Diagnosis not present

## 2019-02-25 NOTE — Progress Notes (Signed)
Radiation Oncology         [phone] ________________________________  Name: Kaitlin [name] MRN: [id number]  Date: 02/25/2019  DOB: 31-May-1950  Post Treatment Note  CC: Kaitlin Perches, MD  Kaitlin Perches, MD  Diagnosis:   68 y.o. female with metastatic NSCLC of the left lower lobe lung with significant mediastinal/hilar lymph node involvement andat least20 subcentimeter brain metastases.  Interval Since Last Radiation:  6 months  07/16/2018 - 07/29/2018:   1. The left lung was treated to 30 Gy in 10 fractions of 3 Gy. 2. The whole brain was treated to 30 Gy in 10 fractions of 3 Gy.  Narrative:  I spoke with the patient to conduct her routine scheduled 3 month follow up visit to review results of her recent follow up MRI brain scan via telephone to spare the patient unnecessary potential exposure in the healthcare setting during the current COVID-19 pandemic.  She has recovered well from the effects of her radiotherapy and remains without complaints.  Her brain MRI from 02/19/19 shows an excellent response to whole-brain XRT, with stable post treatment appearance of the brain without evidence of new or progressive metastatic disease and an unchanged 5 mm left frontal mass most consistent with a small meningioma.                  On review of systems, the patient states that she is doing well overall.  She has noted improvement in her appetite since her dose of Osimertinib was reduced in 10/2018.  She has a PEG tube in place and is drinking 3-4 Ensures per day along with 3 tube feedings/daily.  Her PEG tube was recently replaced in 11/2018 due to malfunction. Her sense of taste is gradually improving which is certainly helping with improved appetite.  She denies abdominal pain and reports resolution of dysphagia and throat irritation.  She denies chest pain, productive cough, hemoptysis or increased shortness of breath. She is gradually getting her energy and strength back and denies headaches,  dizziness/imbalance, focal weakness, tremor, changes in visual or auditory acuity, tinnitus or seizure activity.  She has continued on targeted systemic therapy with Osimertinib at a reduced dose to 40 mg under the care and direction of Dr. Ellin [name].  Her most recent systemic imaging from 11/03/18 showed evolutionary changes of radiation therapy in the medial aspects of both hemi thoraces and left lower lobe but no evidence of adenopathy.  There was ill-defined soft tissue in the paratracheal fat without discrete measurable lesion and the visualized portions of the liver, adrenal glands and right kidney were unremarkable. She had a recent follow up visit with Dr. Ellin [name] on 02/19/19 and the plan is to continue her current systemic regimen and repeat systemic imaging of the chest in 02/2019 with a follow up visit thereafter to review results.  ALLERGIES:  is allergic to banana and aspirin.  Meds: Current Outpatient Medications  Medication Sig Dispense Refill   albuterol (PROVENTIL HFA;VENTOLIN HFA) 108 (90 BASE) MCG/ACT inhaler Inhale 2 puffs into the lungs every 6 (six) hours as needed for wheezing.     albuterol (PROVENTIL) (2.5 MG/3ML) 0.083% nebulizer solution Take 3 mLs (2.5 mg total) by nebulization every 6 (six) hours as needed for wheezing or shortness of breath. (Patient not taking: Reported on 02/19/2019) 75 mL 2   amLODipine (NORVASC) 5 MG tablet Take 5 mg by mouth daily.     colchicine 0.6 MG tablet Take 1 tablet (0.6 mg total) by mouth daily. (  Patient not taking: Reported on 02/19/2019) 15 tablet 0   enoxaparin (LOVENOX) 150 MG/ML injection Inject 1 mL (150 mg total) into the skin daily. 0 mL    HYDROcodone-acetaminophen (NORCO/VICODIN) 5-325 MG tablet      HYDROMET 5-1.5 MG/5ML syrup      lactose free nutrition (BOOST) LIQD Take 237 mLs by mouth 4 (four) times daily.     LORazepam (ATIVAN) 0.5 MG tablet Take 0.5 mg by mouth at bedtime as needed for anxiety.     metoCLOPramide  (REGLAN) 10 MG tablet Take 1 tablet (10 mg total) by mouth every 8 (eight) hours as needed for refractory nausea / vomiting. (Patient not taking: Reported on 02/19/2019) 30 tablet 0   pantoprazole (PROTONIX) 40 MG tablet TAKE 1 TABLET BY MOUTH ONCE DAILY. 30 tablet 0   potassium chloride (K-DUR) 10 MEQ tablet Take 2 tablets (20 mEq total) by mouth 2 (two) times daily. 120 tablet 2   prochlorperazine (COMPAZINE) 10 MG tablet TAKE (1) TABLET BY MOUTH EVERY SIX HOURS AS NEEDED FOR NAUSEA OR VOMITING. (Patient not taking: No sig reported) 30 tablet 10   Water For Irrigation, Sterile (FREE WATER) SOLN Place 200 mLs into feeding tube every 6 (six) hours.     zolpidem (AMBIEN CR) 6.25 MG CR tablet Take 1 tablet (6.25 mg total) by mouth at bedtime as needed for sleep. (Patient taking differently: Take 6.25 mg by mouth at bedtime. ) 30 tablet 0   No current facility-administered medications for this encounter.     Physical Findings: Unable to assess due to to telephone format used for this visit.  Lab Findings: Lab Results  Component Value Date   WBC 4.7 02/19/2019   HGB 13.6 02/19/2019   HCT 43.7 02/19/2019   MCV 86.9 02/19/2019   PLT 225 02/19/2019     Radiographic Findings: Mr Laqueta Jean ZO Contrast  Result Date: 02/19/2019 CLINICAL DATA:  Lung cancer. Follow-up brain metastases status post radiation therapy. EXAM: MRI HEAD WITHOUT AND WITH CONTRAST TECHNIQUE: Multiplanar, multiecho pulse sequences of the brain and surrounding structures were obtained without and with intravenous contrast. CONTRAST:  7.33mL GADAVIST GADOBUTROL 1 MMOL/ML IV SOLN COMPARISON:  11/01/2018 FINDINGS: Brain: Residual punctate, faintly enhancing lesions in the left middle frontal gyrus (series 9, image 33), left frontal operculum (series 9, image 26), and left temporal stem (series 9, image 19) are unchanged. A 5 mm enhancing mass over the left frontal convexity is unchanged and extra-axial, most consistent with a  meningioma rather than a metastasis (series 9, image 38). No new enhancing brain lesion, acute infarct, intracranial hemorrhage, intracranial mass effect, significant brain edema, or extra-axial fluid collection is identified. Mild cerebral atrophy and minimal chronic small vessel ischemic cerebral white matter disease are unchanged. Vascular: Major intracranial vascular flow voids are preserved. Skull and upper cervical spine: Unremarkable bone marrow signal. Sinuses/Orbits: Left cataract extraction. Mild mucosal thickening in the paranasal sinuses. Small right and small to moderate left mastoid effusions. Other: None. IMPRESSION: 1. Stable post treatment appearance of the brain without evidence of new or progressive metastatic disease. 2. Unchanged 5 mm left frontal mass most consistent with a small meningioma. 3. No acute intracranial abnormality. Electronically Signed   By: Sebastian Ache M.D.   On: 02/19/2019 16:20    Impression/Plan:  This visit was conducted via telephone to spare the patient unnecessary potential exposure in the healthcare setting during the current COVID-19 pandemic.  1. 68 y.o. female with metastatic NSCLC of the left lower  lobe lung with significant mediastinal/hilar lymph node involvement andat least20 subcentimeter brain metastases. She has recovered well from the effects of her radiotherapy and remains both clinically and radiographically stable. We reviewed her most recent brain MRI results from 02/19/19 which shows an excellent response to whole-brain XRT and disease stability without evidence for new  Or progressive lesions.  This was reviewed at the recent multidisciplinary brain tumor boards on 02/25/19 and the consensus recommendation is to continue with serial brain MRI every 3 months to monitor for any signs of disease recurrence or progression.  We will tentatively plan to see her back in the office following each scan to review findings and recommndations once the  COVID-19 restrictions have been lifted and it is felt safe.  Otherwise, I will continue to call her for telephone follow up until that time.  She will also continue in routine follow up with Dr. Ellin [name] for management of her systemic disease treatment.  The current plan is to continue on targeted systemic therapy with Osimertinib at a reduced dose of 40 mg daily.  She appears to have a good understanding of our recommendations and is in agreement with the stated plan. She knows to call at any time in the interim with questions or concerns related to her previous radiation.  Given current concerns for patient exposure during the COVID-19 pandemic, this encounter was conducted via telephone. The patient was notified in advance and was offered a WebEX meeting to allow for face to face communication but unfortunately reported that she did not have the appropriate resources/technology to support such a visit and instead preferred to proceed with telephone consult. The patient has given verbal consent for this type of encounter. The time spent during this encounter was 20 minutes and 50% of that time was spent in the coordination of her care. The attendants for this meeting include Elsye Mccollister PA-C, and patient, Kaitlin Hainsworth. [name]. During the encounter, Skylan Gift PA-C was located at [facility] Radiation Oncology Department.  Patient, Kaitlin [name] was located at home.    Marguarite Arbour, PA-C

## 2019-03-06 ENCOUNTER — Telehealth (HOSPITAL_COMMUNITY): Payer: Self-pay

## 2019-03-06 NOTE — Telephone Encounter (Signed)
Nutrition Follow-up:  Patient called RD asking for more tape for PEG dressing and syringes.  Reports that she has plenty of formula.    RD spoke with DME and patient's last order was in July 2020.  DME tried to call in August and Sept with no call back.  DME has to send out full order (formula, supplies, tape, dressing, etc) due to billing purposes.     Patient reports that she is not using tube everyday for feeding purposes.  Continues to eat orally and drinking 3 boost per day.  Reports that she has been gaining weight and wants tube removed.    Weight increased to 161 lb on visit 9/24 from 157 lb.    Patient wanting to eat orally and maintain weight without using feeding tube for the next few weeks until seen by Dr. Raliegh Ip on 10/26.  Reviewed with patient that she must flush feeding tube with water (76ml) daily to keep tube patent when not using for feeding.  Patient verbalized understanding.  If patient able to maintain weight without using tube for ~4 weeks would recommend removal.   RD able to provide few syringes and tape for patient to come and pick up for the next few weeks.  Patient verbalized understanding. Provider informed.     Will follow-up on 10/30.    Courtenay Creger B. Zenia Resides, [place], [place] Registered Dietitian [phone] (pager)

## 2019-03-09 ENCOUNTER — Other Ambulatory Visit (HOSPITAL_COMMUNITY): Payer: Medicare HMO

## 2019-03-20 ENCOUNTER — Inpatient Hospital Stay (HOSPITAL_COMMUNITY): Payer: Medicare HMO | Attending: Hematology

## 2019-03-20 ENCOUNTER — Ambulatory Visit (HOSPITAL_COMMUNITY)
Admission: RE | Admit: 2019-03-20 | Discharge: 2019-03-20 | Disposition: A | Discharge status: Home / Self Care | Payer: Medicare HMO | Source: Ambulatory Visit | Attending: Hematology | Admitting: Hematology

## 2019-03-20 ENCOUNTER — Other Ambulatory Visit: Payer: Self-pay

## 2019-03-20 DIAGNOSIS — C7931 Secondary malignant neoplasm of brain: Secondary | ICD-10-CM | POA: Diagnosis not present

## 2019-03-20 DIAGNOSIS — Z79899 Other long term (current) drug therapy: Secondary | ICD-10-CM | POA: Diagnosis not present

## 2019-03-20 DIAGNOSIS — I10 Essential (primary) hypertension: Secondary | ICD-10-CM | POA: Diagnosis not present

## 2019-03-20 DIAGNOSIS — C3492 Malignant neoplasm of unspecified part of left bronchus or lung: Secondary | ICD-10-CM | POA: Insufficient documentation

## 2019-03-20 DIAGNOSIS — M25511 Pain in right shoulder: Secondary | ICD-10-CM | POA: Diagnosis not present

## 2019-03-20 DIAGNOSIS — C3491 Malignant neoplasm of unspecified part of right bronchus or lung: Secondary | ICD-10-CM

## 2019-03-20 DIAGNOSIS — E119 Type 2 diabetes mellitus without complications: Secondary | ICD-10-CM | POA: Diagnosis not present

## 2019-03-20 DIAGNOSIS — Z86718 Personal history of other venous thrombosis and embolism: Secondary | ICD-10-CM | POA: Diagnosis not present

## 2019-03-20 DIAGNOSIS — Z7901 Long term (current) use of anticoagulants: Secondary | ICD-10-CM | POA: Diagnosis not present

## 2019-03-20 DIAGNOSIS — Z86711 Personal history of pulmonary embolism: Secondary | ICD-10-CM | POA: Diagnosis not present

## 2019-03-20 DIAGNOSIS — Z85118 Personal history of other malignant neoplasm of bronchus and lung: Secondary | ICD-10-CM | POA: Diagnosis not present

## 2019-03-20 LAB — CBC WITH DIFFERENTIAL/PLATELET
Abs Immature Granulocytes: 0.01 10*3/uL (ref 0.00–0.07)
Basophils Absolute: 0 10*3/uL (ref 0.0–0.1)
Basophils Relative: 1 %
Eosinophils Absolute: 0.3 10*3/uL (ref 0.0–0.5)
Eosinophils Relative: 10 %
HCT: 44.1 % (ref 36.0–46.0)
Hemoglobin: 14 g/dL (ref 12.0–15.0)
Immature Granulocytes: 0 %
Lymphocytes Relative: 28 %
Lymphs Abs: 1 10*3/uL (ref 0.7–4.0)
MCH: 27.3 pg (ref 26.0–34.0)
MCHC: 31.7 g/dL (ref 30.0–36.0)
MCV: 86.1 fL (ref 80.0–100.0)
Monocytes Absolute: 0.5 10*3/uL (ref 0.1–1.0)
Monocytes Relative: 15 %
Neutro Abs: 1.7 10*3/uL (ref 1.7–7.7)
Neutrophils Relative %: 46 %
Platelets: 181 10*3/uL (ref 150–400)
RBC: 5.12 MIL/uL — ABNORMAL HIGH (ref 3.87–5.11)
RDW: 15 % (ref 11.5–15.5)
WBC: 3.6 10*3/uL — ABNORMAL LOW (ref 4.0–10.5)
nRBC: 0 % (ref 0.0–0.2)

## 2019-03-20 LAB — COMPREHENSIVE METABOLIC PANEL
ALT: 30 U/L (ref 0–44)
AST: 22 U/L (ref 15–41)
Albumin: 4 g/dL (ref 3.5–5.0)
Alkaline Phosphatase: 60 U/L (ref 38–126)
Anion gap: 7 (ref 5–15)
BUN: 21 mg/dL (ref 8–23)
CO2: 26 mmol/L (ref 22–32)
Calcium: 9.9 mg/dL (ref 8.9–10.3)
Chloride: 102 mmol/L (ref 98–111)
Creatinine, Ser: 0.91 mg/dL (ref 0.44–1.00)
GFR calc Af Amer: >60 mL/min (ref >60)
GFR calc non Af Amer: >60 mL/min (ref >60)
Glucose, Bld: 87 mg/dL (ref 70–99)
Potassium: 4.2 mmol/L (ref 3.5–5.1)
Sodium: 135 mmol/L (ref 135–145)
Total Bilirubin: 0.6 mg/dL (ref 0.3–1.2)
Total Protein: 7.4 g/dL (ref 6.5–8.1)

## 2019-03-20 LAB — MAGNESIUM: Magnesium: 1.9 mg/dL (ref 1.7–2.4)

## 2019-03-20 MED ORDER — IOHEXOL 300 MG/ML  SOLN
75.0000 mL | Freq: Once | INTRAMUSCULAR | Status: AC | PRN
  Administered 2019-03-20: 08:00:00 75 mL via INTRAVENOUS

## 2019-03-23 ENCOUNTER — Encounter (HOSPITAL_COMMUNITY): Payer: Self-pay | Admitting: Hematology

## 2019-03-23 ENCOUNTER — Other Ambulatory Visit: Payer: Self-pay

## 2019-03-23 ENCOUNTER — Inpatient Hospital Stay (HOSPITAL_BASED_OUTPATIENT_CLINIC_OR_DEPARTMENT_OTHER): Payer: Medicare HMO | Admitting: Hematology

## 2019-03-23 DIAGNOSIS — Z7901 Long term (current) use of anticoagulants: Secondary | ICD-10-CM | POA: Diagnosis not present

## 2019-03-23 DIAGNOSIS — Z86718 Personal history of other venous thrombosis and embolism: Secondary | ICD-10-CM | POA: Diagnosis not present

## 2019-03-23 DIAGNOSIS — E119 Type 2 diabetes mellitus without complications: Secondary | ICD-10-CM | POA: Diagnosis not present

## 2019-03-23 DIAGNOSIS — Z86711 Personal history of pulmonary embolism: Secondary | ICD-10-CM | POA: Diagnosis not present

## 2019-03-23 DIAGNOSIS — Z79899 Other long term (current) drug therapy: Secondary | ICD-10-CM | POA: Diagnosis not present

## 2019-03-23 DIAGNOSIS — C3492 Malignant neoplasm of unspecified part of left bronchus or lung: Secondary | ICD-10-CM

## 2019-03-23 DIAGNOSIS — C7931 Secondary malignant neoplasm of brain: Secondary | ICD-10-CM | POA: Diagnosis not present

## 2019-03-23 DIAGNOSIS — I10 Essential (primary) hypertension: Secondary | ICD-10-CM | POA: Diagnosis not present

## 2019-03-23 NOTE — Progress Notes (Signed)
Kaitlin [name], [place] [address]   CLINIC:  Medical Oncology/Hematology  PCP:  [name], [place] [address] [phone]   REASON FOR VISIT:  Follow-up for metastatic lung cancer.     INTERVAL HISTORY:  Kaitlin [name] 68 y.o. female seen for follow-up of metastatic lung cancer to the brain.  Appetite and energy levels are reported as 100%.  She is eating better and functioning well.  Denies any headaches or vision changes.  Denies any chest pains, cough or shortness of breath.  Denies any ER visits or hospitalizations.  No nausea, vomiting, diarrhea or constipation reported.  REVIEW OF SYSTEMS:  Review of Systems  Psychiatric/Behavioral: Positive for sleep disturbance.  All other systems reviewed and are negative.    PAST MEDICAL/SURGICAL HISTORY:  Past Medical History:  Diagnosis Date  . Asthma   . Cancer ([place]) 04/21/2018   Lung Cancer   . Carpal tunnel syndrome   . Cataract    left eye  . Diabetes mellitus without complication ([place])   . DVT (deep venous thrombosis) ([place])   . Hypertension   . Pneumonia 04/21/2018  . Pulmonary embolism ([place]) 03/2018   Past Surgical History:  Procedure Laterality Date  . ABDOMINAL HYSTERECTOMY    . biopsy of right breast    . ESOPHAGOGASTRODUODENOSCOPY (EGD) WITH PROPOFOL N/A 09/12/2018   Procedure: ESOPHAGOGASTRODUODENOSCOPY (EGD) WITH PROPOFOL (scope in 1017, scope out 1032);  Surgeon: Virl Cagey, MD;  Location: [facility];  Service: General;  Laterality: N/A;  . FOOT SURGERY    . implant left eye    . INTRAOCULAR LENS INSERTION    . IR GASTROSTOMY TUBE MOD SED  12/05/2018  . PEG PLACEMENT N/A 09/12/2018   Procedure: PERCUTANEOUS ENDOSCOPIC GASTROSTOMY (PEG) PLACEMENT;  Surgeon: Virl Cagey, MD;  Location: [facility];  Service: General;  Laterality: N/A;     SOCIAL HISTORY:  Social History   Socioeconomic History  . Marital status: Widowed    Spouse name:  Not on file  . Number of children: 4  . Years of education: 68  . Highest education level: High school graduate  Occupational History  . Not on file  Social Needs  . Financial resource strain: Very hard  . Food insecurity    Worry: Sometimes true    Inability: Sometimes true  . Transportation needs    Medical: No    Non-medical: No  Tobacco Use  . Smoking status: Never Smoker  . Smokeless tobacco: Never Used  Substance and Sexual Activity  . Alcohol use: Not Currently    Comment: occ  . Drug use: No  . Sexual activity: Yes    Partners: Male    Birth control/protection: Surgical  Lifestyle  . Physical activity    Days per week: 0 days    Minutes per session: Not on file  . Stress: Only a little  Relationships  . Social connections    Talks on phone: More than three times a week    Gets together: More than three times a week    Attends religious service: More than 4 times per year    Active member of club or organization: Yes    Attends meetings of clubs or organizations: More than 4 times per year    Relationship status: Widowed  . Intimate partner violence    Fear of current or ex partner: No    Emotionally abused: No    Physically abused:  No    Forced sexual activity: No  Other Topics Concern  . 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    FAMILY HISTORY:  Family History  Problem Relation Age of Onset  . Heart failure Mother   . Stroke Mother   . Heart attack Mother   . Kidney failure Other   . Leukemia Father     CURRENT MEDICATIONS:  Outpatient Encounter Medications as of 03/23/2019  Medication Sig Note  . amLODipine (NORVASC) 5 MG tablet Take 5 mg by mouth daily.   Marland Kitchen enoxaparin (LOVENOX) 150 MG/ML injection Inject 1 mL (150 mg total) into the  skin daily.   Marland Kitchen lactose free nutrition (BOOST) LIQD Take 237 mLs by mouth 4 (four) times daily.   . potassium chloride (K-DUR) 10 MEQ tablet Take 2 tablets (20 mEq total) by mouth 2 (two) times daily.   Marland Kitchen albuterol (PROVENTIL HFA;VENTOLIN HFA) 108 (90 BASE) MCG/ACT inhaler Inhale 2 puffs into the lungs every 6 (six) hours as needed for wheezing.   Marland Kitchen albuterol (PROVENTIL) (2.5 MG/3ML) 0.083% nebulizer solution Take 3 mLs (2.5 mg total) by nebulization every 6 (six) hours as needed for wheezing or shortness of breath. (Patient not taking: Reported on 02/19/2019)   . colchicine 0.6 MG tablet Take 1 tablet (0.6 mg total) by mouth daily. (Patient not taking: Reported on 03/23/2019) 12/02/2018: Not consistent in taking daily  . HYDROcodone-acetaminophen (NORCO/VICODIN) 5-325 MG tablet    . HYDROMET 5-1.5 MG/5ML syrup    . LORazepam (ATIVAN) 0.5 MG tablet Take 0.5 mg by mouth at bedtime as needed for anxiety.   . metoCLOPramide (REGLAN) 10 MG tablet Take 1 tablet (10 mg total) by mouth every 8 (eight) hours as needed for refractory nausea / vomiting. (Patient not taking: Reported on 02/19/2019)   . pantoprazole (PROTONIX) 40 MG tablet TAKE 1 TABLET BY MOUTH ONCE DAILY. (Patient not taking: Reported on 03/23/2019)   . prochlorperazine (COMPAZINE) 10 MG tablet TAKE (1) TABLET BY MOUTH EVERY SIX HOURS AS NEEDED FOR NAUSEA OR VOMITING. (Patient not taking: No sig reported)   . Water For Irrigation, Sterile (FREE WATER) SOLN Place 200 mLs into feeding tube every 6 (six) hours. (Patient not taking: Reported on 03/23/2019)   . zolpidem (AMBIEN CR) 6.25 MG CR tablet Take 1 tablet (6.25 mg total) by mouth at bedtime as needed for sleep. (Patient not taking: Reported on 03/23/2019)    No facility-administered encounter medications on file as of 03/23/2019.     ALLERGIES:  Allergies  Allergen Reactions  . Banana Anaphylaxis  . Aspirin Other (See Comments)    G.I. Upset      PHYSICAL EXAM:  ECOG Performance  status: 1  Vitals:   03/23/19 1609  BP: 125/79  Pulse: 77  Resp: 16  Temp: 97.8 F (36.6 C)  SpO2: 100%   Filed Weights   03/23/19 1609  Weight: 163 lb (73.9 kg)    Physical Exam Vitals signs reviewed.  Constitutional:      Appearance: Normal appearance.  Cardiovascular:     Rate and Rhythm: Normal rate and regular rhythm.     Heart sounds: Normal heart sounds.  Pulmonary:     Effort: Pulmonary effort is normal.     Breath  sounds: Normal breath sounds.  Abdominal:     General: There is no distension.     Palpations: Abdomen is soft. There is no mass.  Musculoskeletal:        General: No swelling.  Skin:    General: Skin is warm.  Neurological:     General: No focal deficit present.     Mental Status: She is alert and oriented to person, place, and time.  Psychiatric:        Mood and Affect: Mood normal.        Behavior: Behavior normal.      LABORATORY DATA:  I have reviewed the labs as listed.  CBC    Component Value Date/Time   WBC 3.6 (L) 03/20/2019 0834   RBC 5.12 (H) 03/20/2019 0834   HGB 14.0 03/20/2019 0834   HCT 44.1 03/20/2019 0834   PLT 181 03/20/2019 0834   MCV 86.1 03/20/2019 0834   MCH 27.3 03/20/2019 0834   MCHC 31.7 03/20/2019 0834   RDW 15.0 03/20/2019 0834   LYMPHSABS 1.0 03/20/2019 0834   MONOABS 0.5 03/20/2019 0834   EOSABS 0.3 03/20/2019 0834   BASOSABS 0.0 03/20/2019 0834   CMP Latest Ref Rng & Units 03/20/2019 02/19/2019 01/12/2019  Glucose 70 - 99 mg/dL 87 98 66(L)  BUN 8 - 23 mg/dL _0 Creatinine 0.44 - 1.00 mg/dL 0.91 1.16(H) 0.81  Sodium 135 - 145 mmol/L 135 137 134(L)  Potassium 3.5 - 5.1 mmol/L 4.2 4.0 4.6  Chloride 98 - 111 mmol/L 102 103 99  CO2 22 - 32 mmol/L _1 Calcium 8.9 - 10.3 mg/dL 9.9 9.6 9.9  Total Protein 6.5 - 8.1 g/dL 7.4 7.5 7.4  Total Bilirubin 0.3 - 1.2 mg/dL 0.6 0.1(L) 0.3  Alkaline Phos 38 - 126 U/L 60 63 59  AST 15 - 41 U/L 22 21 34  ALT 0 - 44 U/L 30 28 46(H)       DIAGNOSTIC  IMAGING:  I have independently reviewed the scans and discussed with the patient.      ASSESSMENT & PLAN:   Metastatic lung cancer (metastasis from lung to other site), left ([place]) 1.  Metastatic adenocarcinoma of the left lung with multiple brain mets: -Guardant 360 showing EGFR exon 19 deletion.  Patient is a never smoker. - Osimertinib 80 mg daily started on 07/28/2018. -CT of the chest on 11/03/2018 with no evidence of adenopathy.  There is ill-defined soft tissue in the paratracheal fat without discrete measurable lesion.  Visualized portion of the liver, adrenal glands and right kidney are unremarkable. - Osimertinib dose reduced to 40 mg daily around 12/08/2026 due to weight loss and decreased p.o. intake. -We reviewed results of the CT chest with contrast dated 03/20/2019 which showed evolving postradiation masslike fibrosis in the lungs bilaterally.  No definitive findings to suggest recurrent or metastatic disease. -She is continuing to eat well and gained weight since we decrease the dose of osimertinib. -I have recommended follow-up visit in 6 weeks with repeat labs.   2.  Brain metastasis: -MRI of the brain on 05/30/2018 shows 20 subcentimeter enhancing brain lesions consistent with metastasis, no edema. -WBRT 30 Gy in 10 fractions and left lung 30 Gray in 10 fractions from 07/16/2018 - 07/29/2018 -MRI on 11/01/2018 showed excellent response with only faint enhancement of a few residual subcentimeter left hemisphere lesions. -MRI of the brain on 02/19/2019 showed stable posttreatment appearance of the brain without evidence of new or progressive  metastatic disease.  Unchanged 5 mm left frontal mass most consistent with small meningioma.  3.  Recurrent PE: -Most likely secondary to active malignancy.  CT PE protocol on 04/24/2018 shows PE in the right lower lobe pulmonary artery. -CT of the chest with contrast on 07/10/2018 while she was on Eliquis showed bilateral pulmonary emboli, on  the left lung new since previous diagnosis. -She is taking Lovenox without any problems.  4.  Weight loss: - She is taking in 3 cans of Osmolite via gastrostomy tube.  She is also drinking 3 cans of boost per day. -She is eating very well.  She gained 2 pounds in the last 1 month.   Total time spent is 25 minutes with more than 50% of the time spent face-to-face discussing treatment plan, counseling and coordination of care.     Orders placed this encounter:  No orders of the defined types were placed in this encounter.     Derek Jack, MD [place] [phone]

## 2019-03-23 NOTE — Assessment & Plan Note (Signed)
1.  Metastatic adenocarcinoma of the left lung with multiple brain mets: -Guardant 360 showing EGFR exon 19 deletion.  Patient is a never smoker. - Osimertinib 80 mg daily started on 07/28/2018. -CT of the chest on 11/03/2018 with no evidence of adenopathy.  There is ill-defined soft tissue in the paratracheal fat without discrete measurable lesion.  Visualized portion of the liver, adrenal glands and right kidney are unremarkable. - Osimertinib dose reduced to 40 mg daily around 12/08/2026 due to weight loss and decreased p.o. intake. -We reviewed results of the CT chest with contrast dated 03/20/2019 which showed evolving postradiation masslike fibrosis in the lungs bilaterally.  No definitive findings to suggest recurrent or metastatic disease. -She is continuing to eat well and gained weight since we decrease the dose of osimertinib. -I have recommended follow-up visit in 6 weeks with repeat labs.   2.  Brain metastasis: -MRI of the brain on 05/30/2018 shows 20 subcentimeter enhancing brain lesions consistent with metastasis, no edema. -WBRT 30 Gy in 10 fractions and left lung 30 Gray in 10 fractions from 07/16/2018 - 07/29/2018 -MRI on 11/01/2018 showed excellent response with only faint enhancement of a few residual subcentimeter left hemisphere lesions. -MRI of the brain on 02/19/2019 showed stable posttreatment appearance of the brain without evidence of new or progressive metastatic disease.  Unchanged 5 mm left frontal mass most consistent with small meningioma.  3.  Recurrent PE: -Most likely secondary to active malignancy.  CT PE protocol on 04/24/2018 shows PE in the right lower lobe pulmonary artery. -CT of the chest with contrast on 07/10/2018 while she was on Eliquis showed bilateral pulmonary emboli, on the left lung new since previous diagnosis. -She is taking Lovenox without any problems.  4.  Weight loss: - She is taking in 3 cans of Osmolite via gastrostomy tube.  She is also drinking  3 cans of boost per day. -She is eating very well.  She gained 2 pounds in the last 1 month.

## 2019-03-24 ENCOUNTER — Other Ambulatory Visit (HOSPITAL_COMMUNITY): Payer: Self-pay | Admitting: Nurse Practitioner

## 2019-03-25 ENCOUNTER — Other Ambulatory Visit: Payer: Self-pay | Admitting: Orthopedic Surgery

## 2019-03-25 DIAGNOSIS — M899 Disorder of bone, unspecified: Secondary | ICD-10-CM

## 2019-03-27 ENCOUNTER — Inpatient Hospital Stay (HOSPITAL_COMMUNITY): Payer: Medicare HMO

## 2019-03-27 NOTE — Progress Notes (Signed)
Nutrition  Called patient for nutrition follow-up. No answer.  Left message for patient to return call.    Gilles Trimpe B. Zenia Resides, [place], [place] Registered Dietitian [phone] (pager)

## 2019-03-31 ENCOUNTER — Other Ambulatory Visit: Payer: Self-pay

## 2019-03-31 ENCOUNTER — Ambulatory Visit (HOSPITAL_COMMUNITY)
Admission: RE | Admit: 2019-03-31 | Discharge: 2019-03-31 | Disposition: A | Discharge status: Home / Self Care | Payer: Medicare HMO | Source: Ambulatory Visit | Attending: Orthopedic Surgery | Admitting: Orthopedic Surgery

## 2019-03-31 DIAGNOSIS — M899 Disorder of bone, unspecified: Secondary | ICD-10-CM | POA: Diagnosis not present

## 2019-03-31 DIAGNOSIS — M25411 Effusion, right shoulder: Secondary | ICD-10-CM | POA: Diagnosis not present

## 2019-03-31 MED ORDER — GADOBUTROL 1 MMOL/ML IV SOLN
7.0000 mL | Freq: Once | INTRAVENOUS | Status: AC | PRN
  Administered 2019-03-31: 7 mL via INTRAVENOUS

## 2019-04-03 DIAGNOSIS — M25511 Pain in right shoulder: Secondary | ICD-10-CM | POA: Diagnosis not present

## 2019-04-10 ENCOUNTER — Inpatient Hospital Stay (HOSPITAL_COMMUNITY): Payer: Medicare HMO | Attending: Hematology

## 2019-04-10 NOTE — Progress Notes (Signed)
Nutrition Follow-up:  Patient with stage IV adenocarcinoma of lung with metastatic disease of brain.  Patient on osimertinib.  Spoke with patient via phone this pm for nutrition follow-up.  Patient reports that she is still using feeding tube at times and gives about 2 osmolite 1.5, not every day.  Reports that she is eating well and drinking oral nutrition supplements.  "I am eating cake and apple pies to try and get my weight up."     Medications: reviewed  Labs: reviewed  Anthropometrics:   Weight 163 lb on 10/26 in clinic increased from 161 lb on 9/24   NUTRITION DIAGNOSIS:  Increased nutrient needs stable.    INTERVENTION:  Recommend patient stop using feeding tube, eat orally and drink oral nutrition supplements to maintain weight.   If patient able to maintain weight for ~4 weeks would be eligible for consideration of tube removal.  Patient agreeable. Reviewed with patient that she will need to flush tube with 52ml water daily when not in use.  Patient verbalized understanding.  Patient has contact information    MONITORING, EVALUATION, GOAL: weight trends, intake, tube feeding   NEXT VISIT: phone f/u Dec 11  Andalyn Heckstall B. Zenia Resides, [place], [place] Registered Dietitian [phone] (pager)

## 2019-04-13 ENCOUNTER — Telehealth (HOSPITAL_COMMUNITY): Payer: Self-pay | Admitting: *Deleted

## 2019-04-13 ENCOUNTER — Other Ambulatory Visit (HOSPITAL_COMMUNITY): Payer: Self-pay | Admitting: *Deleted

## 2019-04-13 MED ORDER — OSIMERTINIB MESYLATE 40 MG PO TABS: 40.0000 mg | ORAL_TABLET | Freq: Every day | ORAL | 0 refills | Status: DC

## 2019-04-17 ENCOUNTER — Ambulatory Visit: Payer: Medicare HMO | Admitting: Internal Medicine

## 2019-04-17 ENCOUNTER — Encounter: Payer: Self-pay | Admitting: Internal Medicine

## 2019-04-17 ENCOUNTER — Other Ambulatory Visit: Payer: Self-pay

## 2019-04-17 VITALS — BP 118/82 | HR 93 | Temp 98.3°F | Ht 67.0 in | Wt 165.0 lb

## 2019-04-17 DIAGNOSIS — K620 Anal polyp: Secondary | ICD-10-CM

## 2019-04-17 DIAGNOSIS — Z7901 Long term (current) use of anticoagulants: Secondary | ICD-10-CM | POA: Diagnosis not present

## 2019-04-17 DIAGNOSIS — K642 Third degree hemorrhoids: Secondary | ICD-10-CM | POA: Diagnosis not present

## 2019-04-17 DIAGNOSIS — I2699 Other pulmonary embolism without acute cor pulmonale: Secondary | ICD-10-CM | POA: Diagnosis not present

## 2019-04-17 NOTE — Progress Notes (Signed)
Kaitlin [name] 68 y.o. [birth year] [id number]  Assessment & Plan:   Encounter Diagnoses  Name Primary?  . Prolapsed internal hemorrhoids, grade 3 Yes  . Anal polyp   . Long term current use of anticoagulant-enoxaparin   . Pulmonary embolism during treatment with long-term anticoagulation therapy ([facility])     I think she has a prolapsed grade 3 left lateral internal hemorrhoid with at fibroepithelial polyp.  Could be neoplastic but I do not think so.   I am referring her to colorectal surgery for evaluation and treatment as they deem appropriate.  She is not a candidate for colon cancer screening due to her metastatic lung cancer.  Should she need endoscopic evaluation I am happy to do so but it is fine for colorectal surgery to perform that if necessary and desired.  I appreciate the opportunity to care for this patient. CC: [name], MD   Subjective:   Chief Complaint: Hemorrhoids, colonoscopy  HPI The patient is here with complaints of prolapsing hemorrhoids.  Sounds like grade 3 she has to manually reduce them.  He does not have bleeding or pain.  There is occasional constipation.  She remains under treatment for stage IV lung cancer that is metastatic to the brain and elsewhere though she has had an excellent response.  She is wondering about a colonoscopy.  I had seen her last year when she had dysphagia.  That was at the point her lung cancer was discovered.  She occasionally gets constipated when she eats too much cheese and did this recently but in general she is not 1 who struggles to defecate.  She is on enoxaparin chronically for treatment of pulmonary emboli Allergies  Allergen Reactions  . Banana Anaphylaxis  . Aspirin Other (See Comments)    G.I. Upset    Current Meds  Medication Sig  . albuterol (PROVENTIL HFA;VENTOLIN HFA) 108 (90 BASE) MCG/ACT inhaler Inhale 2 puffs into the lungs every 6 (six) hours as needed for wheezing.  Marland Kitchen albuterol (PROVENTIL) (2.5  MG/3ML) 0.083% nebulizer solution Take 3 mLs (2.5 mg total) by nebulization every 6 (six) hours as needed for wheezing or shortness of breath.  Marland Kitchen amLODipine (NORVASC) 5 MG tablet Take 5 mg by mouth daily.  . colchicine 0.6 MG tablet Take 1 tablet (0.6 mg total) by mouth daily.  Marland Kitchen enoxaparin (LOVENOX) 150 MG/ML injection Inject 1 mL (150 mg total) into the skin daily.  Marland Kitchen HYDROcodone-acetaminophen (NORCO/VICODIN) 5-325 MG tablet 1 tablet every 6 (six) hours as needed.   Marland Kitchen HYDROcodone-homatropine (HYCODAN) 5-1.5 MG/5ML syrup Take 5 mLs by mouth every 6 (six) hours as needed for cough.  . lactose free nutrition (BOOST) LIQD Take 237 mLs by mouth 4 (four) times daily.  Marland Kitchen osimertinib mesylate (TAGRISSO) 40 MG tablet Take 1 tablet (40 mg total) by mouth daily.  . pantoprazole (PROTONIX) 40 MG tablet TAKE 1 TABLET BY MOUTH ONCE DAILY.  Marland Kitchen potassium chloride (K-DUR) 10 MEQ tablet Take 2 tablets (20 mEq total) by mouth 2 (two) times daily.  . Water For Irrigation, Sterile (FREE WATER) SOLN Place 200 mLs into feeding tube every 6 (six) hours.  Marland Kitchen zolpidem (AMBIEN CR) 6.25 MG CR tablet Take 1 tablet (6.25 mg total) by mouth at bedtime as needed for sleep.   Past Medical History:  Diagnosis Date  . Asthma   . Cancer ([facility]) 04/21/2018   Lung Cancer   . Carpal tunnel syndrome   . Cataract    left eye  .  Diabetes mellitus without complication ([facility])   . DVT (deep venous thrombosis) ([facility])   . Hypertension   . Pneumonia 04/21/2018  . Pulmonary embolism ([facility]) 03/2018  . Sepsis [facility])    Past Surgical History:  Procedure Laterality Date  . ABDOMINAL HYSTERECTOMY    . biopsy of right breast    . ESOPHAGOGASTRODUODENOSCOPY (EGD) WITH PROPOFOL N/A 09/12/2018   Procedure: ESOPHAGOGASTRODUODENOSCOPY (EGD) WITH PROPOFOL (scope in 1017, scope out 1032);  Surgeon: Lucretia Roers, MD;  Location: [facility];  Service: General;  Laterality: N/A;  . FOOT SURGERY    . implant left eye    . INTRAOCULAR LENS INSERTION     . IR GASTROSTOMY TUBE MOD SED  12/05/2018  . 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   family history includes Heart attack in her mother; Heart failure in her mother; Kidney failure in an other family member; Leukemia in her father; Stroke in her mother.   Review of Systems As per HPI  Objective:   Physical Exam BP 118/82   Pulse 93   Temp 98.3 F (36.8 C)   Ht 5\' 7"  (1.702 m)   Wt 165 lb (74.8 kg)   BMI 25.84 kg/m  Alert and oriented x3 Well-developed well-nourished no acute distress elderly black woman  Lucius Conn CMA present   Rectal  Anoderm NL No prolapse with Valsalva DRE with palpable hemorrhoids in the anal canal but otherwise normal  Anoscopy  Grade 3 LL prolapsed internal hemorrhoid and a fibroepithelial polyp (suspected)

## 2019-04-17 NOTE — Patient Instructions (Addendum)
If you are age 68 or older, your body mass index should be between 23-30. Your Body mass index is 25.84 kg/m. If this is out of the aforementioned range listed, please consider follow up with your Primary Care Provider.  To help prevent the possible spread of infection to our patients, communities, and staff; we will be implementing the following measures:  As of now we are not allowing any visitors/family members to accompany you to any upcoming appointments with [name]. If you have any concerns about this please contact our office to discuss prior to the appointment.   We will refer you to [facility] for your hemorrhoids. They will call you to schedule an appointment.  If you have not heard from then within 2 weeks please let us know and we will follow up.  Thank you for entrusting me with your care and for choosing [facility], Dr. Silvano Rusk

## 2019-04-18 ENCOUNTER — Encounter: Payer: Self-pay | Admitting: Internal Medicine

## 2019-04-18 DIAGNOSIS — K620 Anal polyp: Secondary | ICD-10-CM | POA: Insufficient documentation

## 2019-04-18 DIAGNOSIS — K642 Third degree hemorrhoids: Secondary | ICD-10-CM | POA: Insufficient documentation

## 2019-04-20 ENCOUNTER — Telehealth (HOSPITAL_COMMUNITY): Payer: Self-pay | Admitting: *Deleted

## 2019-04-20 ENCOUNTER — Other Ambulatory Visit (HOSPITAL_COMMUNITY): Payer: Self-pay | Admitting: *Deleted

## 2019-04-20 MED ORDER — OSIMERTINIB MESYLATE 40 MG PO TABS: 40.0000 mg | ORAL_TABLET | Freq: Every day | ORAL | 0 refills | Status: DC

## 2019-04-20 NOTE — Telephone Encounter (Signed)
Tagrisso refill prescription faxed to [facility].

## 2019-04-28 ENCOUNTER — Inpatient Hospital Stay (HOSPITAL_COMMUNITY): Payer: Medicare HMO | Attending: Hematology

## 2019-04-28 ENCOUNTER — Other Ambulatory Visit: Payer: Self-pay

## 2019-04-28 ENCOUNTER — Other Ambulatory Visit (HOSPITAL_COMMUNITY): Payer: Self-pay | Admitting: Hematology

## 2019-04-28 DIAGNOSIS — R634 Abnormal weight loss: Secondary | ICD-10-CM | POA: Insufficient documentation

## 2019-04-28 DIAGNOSIS — I2699 Other pulmonary embolism without acute cor pulmonale: Secondary | ICD-10-CM | POA: Diagnosis not present

## 2019-04-28 DIAGNOSIS — C3491 Malignant neoplasm of unspecified part of right bronchus or lung: Secondary | ICD-10-CM

## 2019-04-28 DIAGNOSIS — C3492 Malignant neoplasm of unspecified part of left bronchus or lung: Secondary | ICD-10-CM | POA: Insufficient documentation

## 2019-04-28 DIAGNOSIS — C7801 Secondary malignant neoplasm of right lung: Secondary | ICD-10-CM | POA: Diagnosis not present

## 2019-04-28 DIAGNOSIS — C7931 Secondary malignant neoplasm of brain: Secondary | ICD-10-CM | POA: Insufficient documentation

## 2019-04-28 LAB — COMPREHENSIVE METABOLIC PANEL WITH GFR
ALT: 19 U/L (ref 0–44)
AST: 18 U/L (ref 15–41)
Albumin: 4.2 g/dL (ref 3.5–5.0)
Alkaline Phosphatase: 70 U/L (ref 38–126)
Anion gap: 11 (ref 5–15)
BUN: 25 mg/dL — ABNORMAL HIGH (ref 8–23)
CO2: 22 mmol/L (ref 22–32)
Calcium: 10.3 mg/dL (ref 8.9–10.3)
Chloride: 104 mmol/L (ref 98–111)
Creatinine, Ser: 0.97 mg/dL (ref 0.44–1.00)
GFR calc Af Amer: >60 mL/min
GFR calc non Af Amer: >60 mL/min
Glucose, Bld: 82 mg/dL (ref 70–99)
Potassium: 4.6 mmol/L (ref 3.5–5.1)
Sodium: 137 mmol/L (ref 135–145)
Total Bilirubin: 0.3 mg/dL (ref 0.3–1.2)
Total Protein: 8 g/dL (ref 6.5–8.1)

## 2019-04-28 LAB — CBC WITH DIFFERENTIAL/PLATELET
Abs Immature Granulocytes: 0.02 10*3/uL (ref 0.00–0.07)
Basophils Absolute: 0 10*3/uL (ref 0.0–0.1)
Basophils Relative: 1 %
Eosinophils Absolute: 0.4 10*3/uL (ref 0.0–0.5)
Eosinophils Relative: 10 %
HCT: 48.5 % — ABNORMAL HIGH (ref 36.0–46.0)
Hemoglobin: 15.3 g/dL — ABNORMAL HIGH (ref 12.0–15.0)
Immature Granulocytes: 1 %
Lymphocytes Relative: 26 %
Lymphs Abs: 1.1 10*3/uL (ref 0.7–4.0)
MCH: 28.1 pg (ref 26.0–34.0)
MCHC: 31.5 g/dL (ref 30.0–36.0)
MCV: 89 fL (ref 80.0–100.0)
Monocytes Absolute: 0.6 10*3/uL (ref 0.1–1.0)
Monocytes Relative: 14 %
Neutro Abs: 2.1 10*3/uL (ref 1.7–7.7)
Neutrophils Relative %: 48 %
Platelets: 240 10*3/uL (ref 150–400)
RBC: 5.45 MIL/uL — ABNORMAL HIGH (ref 3.87–5.11)
RDW: 14.5 % (ref 11.5–15.5)
WBC: 4.3 10*3/uL (ref 4.0–10.5)
nRBC: 0 % (ref 0.0–0.2)

## 2019-04-28 LAB — MAGNESIUM: Magnesium: 2.3 mg/dL (ref 1.7–2.4)

## 2019-04-29 ENCOUNTER — Inpatient Hospital Stay (HOSPITAL_COMMUNITY): Payer: Medicare HMO

## 2019-04-30 DIAGNOSIS — C7801 Secondary malignant neoplasm of right lung: Secondary | ICD-10-CM | POA: Diagnosis not present

## 2019-04-30 DIAGNOSIS — M25511 Pain in right shoulder: Secondary | ICD-10-CM | POA: Diagnosis not present

## 2019-04-30 DIAGNOSIS — I10 Essential (primary) hypertension: Secondary | ICD-10-CM | POA: Diagnosis not present

## 2019-05-04 ENCOUNTER — Inpatient Hospital Stay (HOSPITAL_BASED_OUTPATIENT_CLINIC_OR_DEPARTMENT_OTHER): Payer: Medicare HMO | Admitting: Hematology

## 2019-05-04 ENCOUNTER — Encounter (HOSPITAL_COMMUNITY): Payer: Self-pay | Admitting: Hematology

## 2019-05-04 ENCOUNTER — Other Ambulatory Visit: Payer: Self-pay

## 2019-05-04 VITALS — BP 116/82 | HR 94 | Temp 98.0°F | Resp 18 | Wt 164.5 lb

## 2019-05-04 DIAGNOSIS — I2699 Other pulmonary embolism without acute cor pulmonale: Secondary | ICD-10-CM | POA: Diagnosis not present

## 2019-05-04 DIAGNOSIS — C7931 Secondary malignant neoplasm of brain: Secondary | ICD-10-CM | POA: Diagnosis not present

## 2019-05-04 DIAGNOSIS — R634 Abnormal weight loss: Secondary | ICD-10-CM | POA: Diagnosis not present

## 2019-05-04 DIAGNOSIS — C3492 Malignant neoplasm of unspecified part of left bronchus or lung: Secondary | ICD-10-CM

## 2019-05-04 DIAGNOSIS — C7801 Secondary malignant neoplasm of right lung: Secondary | ICD-10-CM | POA: Diagnosis not present

## 2019-05-04 NOTE — Progress Notes (Signed)
Kaitlin [name], [place] [address]   CLINIC:  Medical Oncology/Hematology  PCP:  [name], [place] [address] [phone]   REASON FOR VISIT:  Follow-up for metastatic lung cancer.     INTERVAL HISTORY:  Kaitlin [name] 68 y.o. female seen for follow-up of metastatic lung cancer.  Energy and appetite levels are 100%.  Mild constipation is stable.  She stopped using tube feeds about 3 weeks ago.  She is eating by mouth 3-4 times a day.  She is also drinking 3 cans of Ensure/boost daily.  Chronic cough has been stable.  Activity levels have improved.  REVIEW OF SYSTEMS:  Review of Systems  Gastrointestinal: Positive for constipation.  Psychiatric/Behavioral: Positive for sleep disturbance.  All other systems reviewed and are negative.    PAST MEDICAL/SURGICAL HISTORY:  Past Medical History:  Diagnosis Date  . Asthma   . Cancer ([place]) 04/21/2018   Lung Cancer   . Carpal tunnel syndrome   . Cataract    left eye  . Diabetes mellitus without complication ([place])   . DVT (deep venous thrombosis) ([place])   . Hypertension   . Pneumonia 04/21/2018  . Pulmonary embolism ([place]) 03/2018  . Sepsis [facility])    Past Surgical History:  Procedure Laterality Date  . ABDOMINAL HYSTERECTOMY    . biopsy of right breast    . ESOPHAGOGASTRODUODENOSCOPY (EGD) WITH PROPOFOL N/A 09/12/2018   Procedure: ESOPHAGOGASTRODUODENOSCOPY (EGD) WITH PROPOFOL (scope in 1017, scope out 1032);  Surgeon: Virl Cagey, MD;  Location: [facility];  Service: General;  Laterality: N/A;  . FOOT SURGERY    . implant left eye    . INTRAOCULAR LENS INSERTION    . IR GASTROSTOMY TUBE MOD SED  12/05/2018  . PEG PLACEMENT N/A 09/12/2018   Procedure: PERCUTANEOUS ENDOSCOPIC GASTROSTOMY (PEG) PLACEMENT;  Surgeon: Virl Cagey, MD;  Location: [facility];  Service: General;  Laterality: N/A;     SOCIAL HISTORY:  Social History   Socioeconomic History  .  Marital status: Widowed    Spouse name: Not on file  . Number of children: 4  . Years of education: 1  . Highest education level: High school graduate  Occupational History  . Not on file  Social Needs  . Financial resource strain: Very hard  . Food insecurity    Worry: Sometimes true    Inability: Sometimes true  . Transportation needs    Medical: No    Non-medical: No  Tobacco Use  . Smoking status: Never Smoker  . Smokeless tobacco: Never Used  Substance and Sexual Activity  . Alcohol use: Not Currently    Comment: occ  . Drug use: No  . Sexual activity: Yes    Partners: Male    Birth control/protection: Surgical  Lifestyle  . Physical activity    Days per week: 0 days    Minutes per session: Not on file  . Stress: Only a little  Relationships  . Social connections    Talks on phone: More than three times a week    Gets together: More than three times a week    Attends religious service: More than 4 times per year    Active member of club or organization: Yes    Attends meetings of clubs or organizations: More than 4 times per year    Relationship status: Widowed  . Intimate partner violence    Fear of current or ex partner: No  Emotionally abused: No    Physically abused: No    Forced sexual activity: No  Other Topics Concern  . 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    FAMILY HISTORY:  Family History  Problem Relation Age of Onset  . Heart failure Mother   . Stroke Mother   . Heart attack Mother   . Kidney failure Other   . Leukemia Father     CURRENT MEDICATIONS:  Outpatient Encounter Medications as of 05/04/2019  Medication Sig Note  . amLODipine (NORVASC) 5 MG tablet Take 5 mg by mouth daily.   Marland Kitchen enoxaparin (LOVENOX) 150 MG/ML injection  Inject 1 mL (150 mg total) into the skin daily.   Marland Kitchen lactose free nutrition (BOOST) LIQD Take 237 mLs by mouth 4 (four) times daily.   Marland Kitchen osimertinib mesylate (TAGRISSO) 40 MG tablet Take 1 tablet (40 mg total) by mouth daily.   Marland Kitchen oxyCODONE-acetaminophen (PERCOCET/ROXICET) 5-325 MG tablet    . pantoprazole (PROTONIX) 40 MG tablet TAKE 1 TABLET BY MOUTH ONCE DAILY. 04/17/2019: Pt reports she take sometimes  . potassium chloride (KLOR-CON) 10 MEQ tablet TAKE (2) TABLETS BY MOUTH TWICE DAILY.   Marland Kitchen Water For Irrigation, Sterile (FREE WATER) SOLN Place 200 mLs into feeding tube every 6 (six) hours.   Marland Kitchen zolpidem (AMBIEN CR) 6.25 MG CR tablet Take 1 tablet (6.25 mg total) by mouth at bedtime as needed for sleep.   Marland Kitchen albuterol (PROVENTIL HFA;VENTOLIN HFA) 108 (90 BASE) MCG/ACT inhaler Inhale 2 puffs into the lungs every 6 (six) hours as needed for wheezing.   Marland Kitchen albuterol (PROVENTIL) (2.5 MG/3ML) 0.083% nebulizer solution Take 3 mLs (2.5 mg total) by nebulization every 6 (six) hours as needed for wheezing or shortness of breath. (Patient not taking: Reported on 05/04/2019)   . colchicine 0.6 MG tablet Take 1 tablet (0.6 mg total) by mouth daily. (Patient not taking: Reported on 05/04/2019) 12/02/2018: Not consistent in taking daily  . HYDROcodone-homatropine (HYCODAN) 5-1.5 MG/5ML syrup Take 5 mLs by mouth every 6 (six) hours as needed for cough.   . hydrocortisone (ANUSOL-HC) 2.5 % rectal cream    . metoCLOPramide (REGLAN) 10 MG tablet Take 1 tablet (10 mg total) by mouth every 8 (eight) hours as needed for refractory nausea / vomiting. (Patient not taking: Reported on 02/19/2019)   . prochlorperazine (COMPAZINE) 10 MG tablet TAKE (1) TABLET BY MOUTH EVERY SIX HOURS AS NEEDED FOR NAUSEA OR VOMITING. (Patient not taking: No sig reported)   . [DISCONTINUED] HYDROcodone-acetaminophen (NORCO/VICODIN) 5-325 MG tablet 1 tablet every 6 (six) hours as needed.    . [DISCONTINUED] pantoprazole (PROTONIX) 40 MG tablet TAKE 1  TABLET BY MOUTH ONCE DAILY. (Patient not taking: Reported on 03/23/2019)    No facility-administered encounter medications on file as of 05/04/2019.     ALLERGIES:  Allergies  Allergen Reactions  . Banana Anaphylaxis  . Aspirin Other (See Comments)    G.I. Upset      PHYSICAL EXAM:  ECOG Performance status: 1  Vitals:   05/04/19 1057  BP: 116/82  Pulse: 94  Resp: 18  Temp: 98 F (36.7 C)  SpO2: 100%   Filed Weights   05/04/19 1057  Weight: 164 lb 8 oz (74.6 kg)  Physical Exam Vitals signs reviewed.  Constitutional:      Appearance: Normal appearance.  Cardiovascular:     Rate and Rhythm: Normal rate and regular rhythm.     Heart sounds: Normal heart sounds.  Pulmonary:     Effort: Pulmonary effort is normal.     Breath sounds: Normal breath sounds.  Abdominal:     General: There is no distension.     Palpations: Abdomen is soft. There is no mass.  Musculoskeletal:        General: No swelling.  Skin:    General: Skin is warm.  Neurological:     General: No focal deficit present.     Mental Status: She is alert and oriented to person, place, and time.  Psychiatric:        Mood and Affect: Mood normal.        Behavior: Behavior normal.      LABORATORY DATA:  I have reviewed the labs as listed.  CBC    Component Value Date/Time   WBC 4.3 04/28/2019 1103   RBC 5.45 (H) 04/28/2019 1103   HGB 15.3 (H) 04/28/2019 1103   HCT 48.5 (H) 04/28/2019 1103   PLT 240 04/28/2019 1103   MCV 89.0 04/28/2019 1103   MCH 28.1 04/28/2019 1103   MCHC 31.5 04/28/2019 1103   RDW 14.5 04/28/2019 1103   LYMPHSABS 1.1 04/28/2019 1103   MONOABS 0.6 04/28/2019 1103   EOSABS 0.4 04/28/2019 1103   BASOSABS 0.0 04/28/2019 1103   CMP Latest Ref Rng & Units 04/28/2019 03/20/2019 02/19/2019  Glucose 70 - 99 mg/dL 82 87 98  BUN 8 - 23 mg/dL 25(H) 21 15  Creatinine 0.44 - 1.00 mg/dL 0.97 0.91 1.16(H)  Sodium 135 - 145 mmol/L 137 135 137  Potassium 3.5 - 5.1 mmol/L 4.6 4.2 4.0   Chloride 98 - 111 mmol/L 104 102 103  CO2 22 - 32 mmol/L '22 26 25  '$ Calcium 8.9 - 10.3 mg/dL 10.3 9.9 9.6  Total Protein 6.5 - 8.1 g/dL 8.0 7.4 7.5  Total Bilirubin 0.3 - 1.2 mg/dL 0.3 0.6 0.1(L)  Alkaline Phos 38 - 126 U/L 70 60 63  AST 15 - 41 U/L '18 22 21  '$ ALT 0 - 44 U/L '19 30 28       '$ DIAGNOSTIC IMAGING:  I have independently reviewed the scans and discussed with the patient.      ASSESSMENT & PLAN:   Metastatic lung cancer (metastasis from lung to other site), left ([place]) 1.  Metastatic adenocarcinoma of the left lung with multiple brain mets: -Guardant 360 showing EGFR exon 19 deletion.  Patient is a never smoker. - Osimertinib 80 mg daily started on 07/28/2018. -CT of the chest on 11/03/2018 with no evidence of adenopathy.  There is ill-defined soft tissue in the paratracheal fat without discrete measurable lesion.  Visualized portion of the liver, adrenal glands and right kidney are unremarkable. - Osimertinib dose reduced to 40 mg daily around 12/08/2026 due to weight loss and decreased p.o. intake. -CT of the chest with contrast on 03/20/2019 showed evolving postradiation masslike fibrosis in the lungs bilaterally.  No definitive findings to suggest recurrent or metastatic disease. -She is tolerating very well the decreased dose of osimertinib. -I plan to repeat CT scan in 4 months from the last scan.  We reviewed her labs which are grossly within normal limits. -We plan to repeat MRI in 4 weeks.   2.  Brain metastasis: -MRI of the brain on 05/30/2018  shows 20 subcentimeter enhancing brain lesions consistent with metastasis, no edema. -WBRT 30 Gy in 10 fractions and left lung 30 Gray in 10 fractions from 07/16/2018 - 07/29/2018 -MRI on 11/01/2018 showed excellent response with only faint enhancement of a few residual subcentimeter left hemisphere lesions. -MRI of the brain on 02/19/2019 showed stable posttreatment appearance of the brain without evidence of new or progressive  metastatic disease.  Unchanged 5 mm left frontal mass most consistent with small meningioma. -I have recommended MRI of the brain prior to next visit in 4 weeks.  3.  Recurrent PE: -Most likely secondary to active malignancy.  CT PE protocol on 04/24/2018 shows PE in the right lower lobe pulmonary artery. -CT of the chest with contrast on 07/10/2018 while she was on Eliquis showed bilateral pulmonary emboli, on the left lung new since previous diagnosis. -She is taking Lovenox without any problems.  4.  Weight loss: -She stopped using tube feeds about 2 to 3 weeks ago.  She is currently drinking 3 cans of boost per day and eating more than 3 meals per day. -Her weight has been stable since then.  She is using PEG tube for flushing water. -We will reevaluate her in 4 weeks.  If her weight is staying stable at that time we will discontinue PEG tube.        Orders placed this encounter:  Orders Placed This Encounter  Procedures  . MR Brain W Wo Contrast  . CBC with Differential/Platelet  . Comprehensive metabolic panel      Derek Jack, MD [place] [phone]

## 2019-05-04 NOTE — Patient Instructions (Addendum)
[place] at [facility] Discharge Instructions  You were seen today by Dr. Delton Coombes. He went over your recent lab results. Continue drinking your boost daily. He will schedule you for a repeat MRI of your brain. He will see you back in 4 weeks for labs and follow up.   Thank you for choosing [place] at [facility] to provide your oncology and hematology care.  To afford each patient quality time with our provider, please arrive at least 15 minutes before your scheduled appointment time.   If you have a lab appointment with the [facility] please come in thru the  Main Entrance and check in at the main information desk  You need to re-schedule your appointment should you arrive 10 or more minutes late.  We strive to give you quality time with our providers, and arriving late affects you and other patients whose appointments are after yours.  Also, if you no show three or more times for appointments you may be dismissed from the clinic at the providers discretion.     Again, thank you for choosing [facility].  Our hope is that these requests will decrease the amount of time that you wait before being seen by our physicians.       _____________________________________________________________  Should you have questions after your visit to [facility], please contact our office at [phone] between the hours of 8:00 a.m. and 4:30 p.m.  Voicemails left after 4:00 p.m. will not be returned until the following business day.  For prescription refill requests, have your pharmacy contact our office and allow 72 hours.    Cancer Center Support Programs:   > Cancer Support Group  2nd Tuesday of the month 1pm-2pm, Journey Room

## 2019-05-04 NOTE — Assessment & Plan Note (Signed)
1.  Metastatic adenocarcinoma of the left lung with multiple brain mets: -Guardant 360 showing EGFR exon 19 deletion.  Patient is a never smoker. - Osimertinib 80 mg daily started on 07/28/2018. -CT of the chest on 11/03/2018 with no evidence of adenopathy.  There is ill-defined soft tissue in the paratracheal fat without discrete measurable lesion.  Visualized portion of the liver, adrenal glands and right kidney are unremarkable. - Osimertinib dose reduced to 40 mg daily around 12/08/2026 due to weight loss and decreased p.o. intake. -CT of the chest with contrast on 03/20/2019 showed evolving postradiation masslike fibrosis in the lungs bilaterally.  No definitive findings to suggest recurrent or metastatic disease. -She is tolerating very well the decreased dose of osimertinib. -I plan to repeat CT scan in 4 months from the last scan.  We reviewed her labs which are grossly within normal limits. -We plan to repeat MRI in 4 weeks.   2.  Brain metastasis: -MRI of the brain on 05/30/2018 shows 20 subcentimeter enhancing brain lesions consistent with metastasis, no edema. -WBRT 30 Gy in 10 fractions and left lung 30 Gray in 10 fractions from 07/16/2018 - 07/29/2018 -MRI on 11/01/2018 showed excellent response with only faint enhancement of a few residual subcentimeter left hemisphere lesions. -MRI of the brain on 02/19/2019 showed stable posttreatment appearance of the brain without evidence of new or progressive metastatic disease.  Unchanged 5 mm left frontal mass most consistent with small meningioma. -I have recommended MRI of the brain prior to next visit in 4 weeks.  3.  Recurrent PE: -Most likely secondary to active malignancy.  CT PE protocol on 04/24/2018 shows PE in the right lower lobe pulmonary artery. -CT of the chest with contrast on 07/10/2018 while she was on Eliquis showed bilateral pulmonary emboli, on the left lung new since previous diagnosis. -She is taking Lovenox without any  problems.  4.  Weight loss: -She stopped using tube feeds about 2 to 3 weeks ago.  She is currently drinking 3 cans of boost per day and eating more than 3 meals per day. -Her weight has been stable since then.  She is using PEG tube for flushing water. -We will reevaluate her in 4 weeks.  If her weight is staying stable at that time we will discontinue PEG tube.

## 2019-05-08 ENCOUNTER — Inpatient Hospital Stay (HOSPITAL_COMMUNITY): Payer: Medicare HMO

## 2019-05-08 NOTE — Progress Notes (Signed)
Nutrition Follow-up:  Patient with stage IV adenocarcinoma of lung with metastatic disease of brain.  Patient on osimertinib.    Spoke with patient via phone.  Patient has stopped using feeding tube around 11/16.  She flushes it 2 times per day.  Reports that her appetite is good and that she has been eating well and drinking boost shakes.  Denies any nutritional issues at this time.     Medications: reviewed  Labs: reviewed  Anthropometrics:   Weight 164 lb 8 oz on 12/7 slight increase from 163 lb on 10/26 161 lb on 9/24  NUTRITION DIAGNOSIS: Increased nutrient needs being met with current oral intake   INTERVENTION:  Patient to continue to eat 3 meals per day and drink oral nutrition supplements for adequate calories and protein.  Patient to continue to not using feeding tube but flush with 62m of water 1-2 times daily.  Agree with MD if patient able to maintain weight on follow-up without using tube for nutrition and hydration removal of PEG tube would be appropriate.   Patient has contact information if needed in the future    MONITORING, EVALUATION, GOAL: weight trends, intake   NEXT VISIT: no follow-up planned  Keilan Nichol B. AZenia [place]ides [place]ld [place] LAdrianRegistered Dietitian [phone](pager)

## 2019-05-13 ENCOUNTER — Other Ambulatory Visit: Payer: Self-pay | Admitting: Radiation Therapy

## 2019-05-19 ENCOUNTER — Other Ambulatory Visit (HOSPITAL_COMMUNITY): Payer: Self-pay | Admitting: *Deleted

## 2019-05-19 MED ORDER — OSIMERTINIB MESYLATE 40 MG PO TABS: 40.0000 mg | ORAL_TABLET | Freq: Every day | ORAL | 0 refills | Status: DC

## 2019-06-01 ENCOUNTER — Encounter (HOSPITAL_COMMUNITY): Payer: Self-pay | Admitting: *Deleted

## 2019-06-01 ENCOUNTER — Other Ambulatory Visit (HOSPITAL_COMMUNITY): Payer: Self-pay | Admitting: *Deleted

## 2019-06-01 NOTE — Progress Notes (Signed)
Patient called with questions about her appointments.  She was updated on dates and times. She verbalizes understanding.

## 2019-06-02 ENCOUNTER — Other Ambulatory Visit: Payer: Self-pay

## 2019-06-02 ENCOUNTER — Ambulatory Visit (HOSPITAL_COMMUNITY)
Admission: RE | Admit: 2019-06-02 | Discharge: 2019-06-02 | Disposition: A | Discharge status: Home / Self Care | Payer: Medicare HMO | Source: Ambulatory Visit | Attending: Hematology | Admitting: Hematology

## 2019-06-02 ENCOUNTER — Inpatient Hospital Stay (HOSPITAL_COMMUNITY): Payer: Medicare HMO | Attending: Hematology

## 2019-06-02 DIAGNOSIS — Z7901 Long term (current) use of anticoagulants: Secondary | ICD-10-CM | POA: Diagnosis not present

## 2019-06-02 DIAGNOSIS — D329 Benign neoplasm of meninges, unspecified: Secondary | ICD-10-CM | POA: Diagnosis not present

## 2019-06-02 DIAGNOSIS — C7931 Secondary malignant neoplasm of brain: Secondary | ICD-10-CM | POA: Diagnosis not present

## 2019-06-02 DIAGNOSIS — Z79899 Other long term (current) drug therapy: Secondary | ICD-10-CM | POA: Insufficient documentation

## 2019-06-02 DIAGNOSIS — C3492 Malignant neoplasm of unspecified part of left bronchus or lung: Secondary | ICD-10-CM | POA: Insufficient documentation

## 2019-06-02 DIAGNOSIS — Z86711 Personal history of pulmonary embolism: Secondary | ICD-10-CM | POA: Insufficient documentation

## 2019-06-02 DIAGNOSIS — C3490 Malignant neoplasm of unspecified part of unspecified bronchus or lung: Secondary | ICD-10-CM | POA: Diagnosis not present

## 2019-06-02 LAB — COMPREHENSIVE METABOLIC PANEL
ALT: 22 U/L (ref 0–44)
AST: 18 U/L (ref 15–41)
Albumin: 3.9 g/dL (ref 3.5–5.0)
Alkaline Phosphatase: 61 U/L (ref 38–126)
Anion gap: 7 (ref 5–15)
BUN: 19 mg/dL (ref 8–23)
CO2: 28 mmol/L (ref 22–32)
Calcium: 9.8 mg/dL (ref 8.9–10.3)
Chloride: 101 mmol/L (ref 98–111)
Creatinine, Ser: 1.16 mg/dL — ABNORMAL HIGH (ref 0.44–1.00)
GFR calc Af Amer: 56 mL/min — ABNORMAL LOW (ref >60)
GFR calc non Af Amer: 48 mL/min — ABNORMAL LOW (ref >60)
Glucose, Bld: 86 mg/dL (ref 70–99)
Potassium: 4.2 mmol/L (ref 3.5–5.1)
Sodium: 136 mmol/L (ref 135–145)
Total Bilirubin: 0.6 mg/dL (ref 0.3–1.2)
Total Protein: 7.2 g/dL (ref 6.5–8.1)

## 2019-06-02 LAB — CBC WITH DIFFERENTIAL/PLATELET
Abs Immature Granulocytes: 0.02 10*3/uL (ref 0.00–0.07)
Basophils Absolute: 0 10*3/uL (ref 0.0–0.1)
Basophils Relative: 1 %
Eosinophils Absolute: 0.3 10*3/uL (ref 0.0–0.5)
Eosinophils Relative: 9 %
HCT: 46 % (ref 36.0–46.0)
Hemoglobin: 14.5 g/dL (ref 12.0–15.0)
Immature Granulocytes: 1 %
Lymphocytes Relative: 22 %
Lymphs Abs: 0.9 10*3/uL (ref 0.7–4.0)
MCH: 28 pg (ref 26.0–34.0)
MCHC: 31.5 g/dL (ref 30.0–36.0)
MCV: 88.8 fL (ref 80.0–100.0)
Monocytes Absolute: 0.5 10*3/uL (ref 0.1–1.0)
Monocytes Relative: 14 %
Neutro Abs: 2.1 10*3/uL (ref 1.7–7.7)
Neutrophils Relative %: 53 %
Platelets: 181 10*3/uL (ref 150–400)
RBC: 5.18 MIL/uL — ABNORMAL HIGH (ref 3.87–5.11)
RDW: 14.1 % (ref 11.5–15.5)
WBC: 3.8 10*3/uL — ABNORMAL LOW (ref 4.0–10.5)
nRBC: 0 % (ref 0.0–0.2)

## 2019-06-02 MED ORDER — GADOBUTROL 1 MMOL/ML IV SOLN
7.0000 mL | Freq: Once | INTRAVENOUS | Status: AC | PRN
  Administered 2019-06-02: 5 mL via INTRAVENOUS

## 2019-06-04 ENCOUNTER — Inpatient Hospital Stay (HOSPITAL_COMMUNITY): Payer: Medicare HMO | Admitting: Hematology

## 2019-06-04 ENCOUNTER — Encounter (HOSPITAL_COMMUNITY): Payer: Self-pay | Admitting: Hematology

## 2019-06-04 ENCOUNTER — Other Ambulatory Visit: Payer: Self-pay

## 2019-06-04 VITALS — BP 129/85 | HR 97 | Temp 97.7°F | Resp 16 | Wt 172.0 lb

## 2019-06-04 DIAGNOSIS — C3492 Malignant neoplasm of unspecified part of left bronchus or lung: Secondary | ICD-10-CM | POA: Diagnosis not present

## 2019-06-04 DIAGNOSIS — C7931 Secondary malignant neoplasm of brain: Secondary | ICD-10-CM | POA: Diagnosis not present

## 2019-06-04 DIAGNOSIS — Z86711 Personal history of pulmonary embolism: Secondary | ICD-10-CM | POA: Diagnosis not present

## 2019-06-04 DIAGNOSIS — C7801 Secondary malignant neoplasm of right lung: Secondary | ICD-10-CM | POA: Diagnosis not present

## 2019-06-04 DIAGNOSIS — Z79899 Other long term (current) drug therapy: Secondary | ICD-10-CM | POA: Diagnosis not present

## 2019-06-04 DIAGNOSIS — Z7901 Long term (current) use of anticoagulants: Secondary | ICD-10-CM | POA: Diagnosis not present

## 2019-06-04 DIAGNOSIS — J4540 Moderate persistent asthma, uncomplicated: Secondary | ICD-10-CM | POA: Diagnosis not present

## 2019-06-04 NOTE — Patient Instructions (Addendum)
[place] at [facility] Discharge Instructions  You were seen today by Dr. Delton Coombes. He went over your recent lab results. Continue taking current dose of Tagrisso. He will refer you back to Dr. Constance Haw for tube removal. He will see you back in 1 month for labs, scan and follow up.   Thank you for choosing [place] at [facility] to provide your oncology and hematology care.  To afford each patient quality time with our provider, please arrive at least 15 minutes before your scheduled appointment time.   If you have a lab appointment with the [facility] please come in thru the  Main Entrance and check in at the main information desk  You need to re-schedule your appointment should you arrive 10 or more minutes late.  We strive to give you quality time with our providers, and arriving late affects you and other patients whose appointments are after yours.  Also, if you no show three or more times for appointments you may be dismissed from the clinic at the providers discretion.     Again, thank you for choosing [facility].  Our hope is that these requests will decrease the amount of time that you wait before being seen by our physicians.       _____________________________________________________________  Should you have questions after your visit to [facility], please contact our office at [phone] between the hours of 8:00 a.m. and 4:30 p.m.  Voicemails left after 4:00 p.m. will not be returned until the following business day.  For prescription refill requests, have your pharmacy contact our office and allow 72 hours.    Cancer Center Support Programs:   > Cancer Support Group  2nd Tuesday of the month 1pm-2pm, Journey Room

## 2019-06-04 NOTE — Progress Notes (Signed)
Kaitlin [name], [place] [address]   CLINIC:  Medical Oncology/Hematology  PCP:  [name], [place] [facility] [address] [phone]   REASON FOR VISIT:  Follow-up for metastatic lung cancer.     INTERVAL HISTORY:  Kaitlin [name] 69 y.o. female seen for follow-up of metastatic lung cancer.  She is tolerating osimertinib 40 mg very well.  Appetite and energy levels are 100%.  No pain reported.  Dry cough and shortness of breath with activity has been stable.  REVIEW OF SYSTEMS:  Review of Systems  Respiratory: Positive for cough and shortness of breath.   All other systems reviewed and are negative.    PAST MEDICAL/SURGICAL HISTORY:  Past Medical History:  Diagnosis Date  . Asthma   . Cancer ([place]) 04/21/2018   Lung Cancer   . Carpal tunnel syndrome   . Cataract    left eye  . Diabetes mellitus without complication ([place])   . DVT (deep venous thrombosis) ([place])   . Hypertension   . Pneumonia 04/21/2018  . Pulmonary embolism ([place]) 03/2018  . Sepsis [facility])    Past Surgical History:  Procedure Laterality Date  . ABDOMINAL HYSTERECTOMY    . biopsy of right breast    . ESOPHAGOGASTRODUODENOSCOPY (EGD) WITH PROPOFOL N/A 09/12/2018   Procedure: ESOPHAGOGASTRODUODENOSCOPY (EGD) WITH PROPOFOL (scope in 1017, scope out 1032);  Surgeon: Virl Cagey, MD;  Location: [facility];  Service: General;  Laterality: N/A;  . FOOT SURGERY    . implant left eye    . INTRAOCULAR LENS INSERTION    . IR GASTROSTOMY TUBE MOD SED  12/05/2018  . PEG PLACEMENT N/A 09/12/2018   Procedure: PERCUTANEOUS ENDOSCOPIC GASTROSTOMY (PEG) PLACEMENT;  Surgeon: Virl Cagey, MD;  Location: [facility];  Service: General;  Laterality: N/A;     SOCIAL HISTORY:  Social History   Socioeconomic History  . Marital status: Widowed    Spouse name: Not on file  . Number of children: 4  . Years of education: 70  . Highest education level: High school  graduate  Occupational History  . Not on file  Tobacco Use  . Smoking status: Never Smoker  . Smokeless tobacco: Never Used  Substance and Sexual Activity  . Alcohol use: Not Currently    Comment: occ  . Drug use: No  . Sexual activity: Yes    Partners: Male    Birth control/protection: Surgical  Other Topics Concern  . 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   Social Determinants of Health   Financial Resource Strain:   . Difficulty of Paying Living Expenses: Not on file  Food Insecurity:   . Worried About Charity fundraiser in the Last Year: Not on file  . Ran Out of Food in the Last Year: Not on file  Transportation Needs:   . Lack of Transportation (Medical): Not on file  . Lack of Transportation (Non-Medical): Not on file  Physical Activity:   . Days of Exercise per Week: Not on file  . Minutes of Exercise per Session: Not on file  Stress:   . Feeling of Stress : Not on file  Social Connections:   . Frequency of Communication with  Friends and Family: Not on file  . Frequency of Social Gatherings with Friends and Family: Not on file  . Attends Religious Services: Not on file  . Active Member of Clubs or Organizations: Not on file  . Attends Archivist Meetings: Not on file  . Marital Status: Not on file  Intimate Partner Violence:   . Fear of Current or Ex-Partner: Not on file  . Emotionally Abused: Not on file  . Physically Abused: Not on file  . Sexually Abused: Not on file    FAMILY HISTORY:  Family History  Problem Relation Age of Onset  . Heart failure Mother   . Stroke Mother   . Heart attack Mother   . Kidney failure Other   . Leukemia Father     CURRENT MEDICATIONS:  Outpatient Encounter Medications as of 06/04/2019  Medication  Sig Note  . albuterol (PROVENTIL HFA;VENTOLIN HFA) 108 (90 BASE) MCG/ACT inhaler Inhale 2 puffs into the lungs every 6 (six) hours as needed for wheezing.   Marland Kitchen albuterol (PROVENTIL) (2.5 MG/3ML) 0.083% nebulizer solution Take 3 mLs (2.5 mg total) by nebulization every 6 (six) hours as needed for wheezing or shortness of breath. (Patient not taking: Reported on 05/04/2019)   . amLODipine (NORVASC) 5 MG tablet Take 5 mg by mouth daily.   . colchicine 0.6 MG tablet Take 1 tablet (0.6 mg total) by mouth daily. (Patient not taking: Reported on 05/04/2019) 12/02/2018: Not consistent in taking daily  . enoxaparin (LOVENOX) 150 MG/ML injection Inject 1 mL (150 mg total) into the skin daily.   Marland Kitchen HYDROcodone-homatropine (HYCODAN) 5-1.5 MG/5ML syrup Take 5 mLs by mouth every 6 (six) hours as needed for cough.   . hydrocortisone (ANUSOL-HC) 2.5 % rectal cream    . lactose free nutrition (BOOST) LIQD Take 237 mLs by mouth 4 (four) times daily.   . metoCLOPramide (REGLAN) 10 MG tablet Take 1 tablet (10 mg total) by mouth every 8 (eight) hours as needed for refractory nausea / vomiting. (Patient not taking: Reported on 02/19/2019)   . osimertinib mesylate (TAGRISSO) 40 MG tablet Take 1 tablet (40 mg total) by mouth daily.   Marland Kitchen oxyCODONE-acetaminophen (PERCOCET/ROXICET) 5-325 MG tablet Take 1-2 tablets by mouth as needed.    . pantoprazole (PROTONIX) 40 MG tablet TAKE 1 TABLET BY MOUTH ONCE DAILY. 04/17/2019: Pt reports she take sometimes  . potassium chloride (KLOR-CON) 10 MEQ tablet TAKE (2) TABLETS BY MOUTH TWICE DAILY.   Marland Kitchen prochlorperazine (COMPAZINE) 10 MG tablet TAKE (1) TABLET BY MOUTH EVERY SIX HOURS AS NEEDED FOR NAUSEA OR VOMITING. (Patient not taking: No sig reported)   . Water For Irrigation, Sterile (FREE WATER) SOLN Place 200 mLs into feeding tube every 6 (six) hours.   Marland Kitchen zolpidem (AMBIEN CR) 6.25 MG CR tablet Take 1 tablet (6.25 mg total) by mouth at bedtime as needed for sleep.   . [DISCONTINUED]  pantoprazole (PROTONIX) 40 MG tablet TAKE 1 TABLET BY MOUTH ONCE DAILY. (Patient not taking: Reported on 03/23/2019)    No facility-administered encounter medications on file as of 06/04/2019.    ALLERGIES:  Allergies  Allergen Reactions  . Banana Anaphylaxis  . Aspirin Other (See Comments)    G.I. Upset      PHYSICAL EXAM:  ECOG Performance status: 1  Vitals:   06/04/19 1004  BP: 129/85  Pulse: 97  Resp: 16  Temp: 97.7 F (36.5 C)  SpO2: 98%   Filed Weights   06/04/19 1004  Weight: 172 lb (  78 kg)    Physical Exam Vitals reviewed.  Constitutional:      Appearance: Normal appearance.  Cardiovascular:     Rate and Rhythm: Normal rate and regular rhythm.     Heart sounds: Normal heart sounds.  Pulmonary:     Effort: Pulmonary effort is normal.     Breath sounds: Normal breath sounds.  Abdominal:     General: There is no distension.     Palpations: Abdomen is soft. There is no mass.  Musculoskeletal:        General: No swelling.  Skin:    General: Skin is warm.  Neurological:     General: No focal deficit present.     Mental Status: She is alert and oriented to person, place, and time.  Psychiatric:        Mood and Affect: Mood normal.        Behavior: Behavior normal.      LABORATORY DATA:  I have reviewed the labs as listed.  CBC    Component Value Date/Time   WBC 3.8 (L) 06/02/2019 0938   RBC 5.18 (H) 06/02/2019 0938   HGB 14.5 06/02/2019 0938   HCT 46.0 06/02/2019 0938   PLT 181 06/02/2019 0938   MCV 88.8 06/02/2019 0938   MCH 28.0 06/02/2019 0938   MCHC 31.5 06/02/2019 0938   RDW 14.1 06/02/2019 0938   LYMPHSABS 0.9 06/02/2019 0938   MONOABS 0.5 06/02/2019 0938   EOSABS 0.3 06/02/2019 0938   BASOSABS 0.0 06/02/2019 0938   CMP Latest Ref Rng & Units 06/02/2019 04/28/2019 03/20/2019  Glucose 70 - 99 mg/dL 86 82 87  BUN 8 - 23 mg/dL 19 25(H) 21  Creatinine 0.44 - 1.00 mg/dL 1.16(H) 0.97 0.91  Sodium 135 - 145 mmol/L 136 137 135  Potassium 3.5  - 5.1 mmol/L 4.2 4.6 4.2  Chloride 98 - 111 mmol/L 101 104 102  CO2 22 - 32 mmol/L _0 Calcium 8.9 - 10.3 mg/dL 9.8 10.3 9.9  Total Protein 6.5 - 8.1 g/dL 7.2 8.0 7.4  Total Bilirubin 0.3 - 1.2 mg/dL 0.6 0.3 0.6  Alkaline Phos 38 - 126 U/L 61 70 60  AST 15 - 41 U/L _1 ALT 0 - 44 U/L _2 DIAGNOSTIC IMAGING:  I have independently reviewed the scans and discussed with the patient.      ASSESSMENT & PLAN:   Metastatic lung cancer (metastasis from lung to other site), left ([place]) 1.  Metastatic adenocarcinoma of the left lung with multiple brain mets: -Guardant 360 showing EGFR exon 19 deletion.  Patient is a never smoker. - Osimertinib 80 mg daily started on 07/28/2018. -Osimertinib dose reduced to 40 mg daily around 12/08/2018 due to weight loss and decreased p.o. intake. -CT of the chest on 03/20/2019 showed evolving postradiation masslike fibrosis in the lung bilaterally.  No definitive evidence of recurrence. -She is tolerating reduced dose of osimertinib very well.  I have reviewed her labs. -I have recommended a CT scan of the chest in 1 month.  I will see her back after the scan.   2.  Brain metastasis: -MRI of the brain on 05/30/2018 shows 20 subcentimeter enhancing brain lesions consistent with metastasis, no edema. -WBRT 30 Gy in 10 fractions and left lung 30 Gray in 10 fractions from 07/16/2018 - 07/29/2018 -MRI on 11/01/2018 showed excellent response with only faint enhancement of a few residual subcentimeter left hemisphere lesions. -MRI  of the brain on 02/19/2019 showed stable posttreatment appearance of the brain without evidence of new or progressive metastatic disease.  Unchanged 5 mm left frontal mass most consistent with small meningioma. -I have reviewed MRI of the brain from 06/02/2019 which showed stable posttreatment appearance of the brain with no new meta stasis.  3.  Recurrent PE: -Most likely secondary to active malignancy.  CT PE protocol  on 04/24/2018 shows PE in the right lower lobe pulmonary artery. -CT of the chest with contrast on 07/10/2018 while she was on Eliquis showed bilateral pulmonary emboli, on the left lung new since previous diagnosis. -She is currently taking Lovenox without any bleeding issues.  4.  Weight loss: -She has stopped using tube feeds. -She is maintaining her weight very well. -I will send her back to Dr. Constance Haw for PEG tube discontinuation.  5.  Elevated creatinine: -Creatinine today is 1.17.  Normal baseline is 0.9. -I have recommended her to drink a lot of fluids.  She was told to avoid NSAIDs.    Total time spent is 30 minutes with more than 50% of the time spent face-to-face.    Orders placed this encounter:  Orders Placed This Encounter  Procedures  . CT Chest W Contrast  . CBC with Differential/Platelet  . Comprehensive metabolic panel      Derek Jack, MD [place] [phone]

## 2019-06-08 ENCOUNTER — Encounter (HOSPITAL_COMMUNITY): Payer: Self-pay | Admitting: Hematology

## 2019-06-08 NOTE — Assessment & Plan Note (Addendum)
1.  Metastatic adenocarcinoma of the left lung with multiple brain mets: -Guardant 360 showing EGFR exon 19 deletion.  Patient is a never smoker. - Osimertinib 80 mg daily started on 07/28/2018. -Osimertinib dose reduced to 40 mg daily around 12/08/2018 due to weight loss and decreased p.o. intake. -CT of the chest on 03/20/2019 showed evolving postradiation masslike fibrosis in the lung bilaterally.  No definitive evidence of recurrence. -She is tolerating reduced dose of osimertinib very well.  I have reviewed her labs.  I had a prolonged discussion with her about continuing treatment with osimertinib as long as it helps and she tolerates it.  She had many questions about the duration of therapy. -I have recommended a CT scan of the chest in 1 month.  I will see her back after the scan.   2.  Brain metastasis: -MRI of the brain on 05/30/2018 shows 20 subcentimeter enhancing brain lesions consistent with metastasis, no edema. -WBRT 30 Gy in 10 fractions and left lung 30 Gray in 10 fractions from 07/16/2018 - 07/29/2018 -MRI on 11/01/2018 showed excellent response with only faint enhancement of a few residual subcentimeter left hemisphere lesions. -MRI of the brain on 02/19/2019 showed stable posttreatment appearance of the brain without evidence of new or progressive metastatic disease.  Unchanged 5 mm left frontal mass most consistent with small meningioma. -I have reviewed MRI of the brain from 06/02/2019 which showed stable posttreatment appearance of the brain with no new meta stasis.  3.  Recurrent PE: -Most likely secondary to active malignancy.  CT PE protocol on 04/24/2018 shows PE in the right lower lobe pulmonary artery. -CT of the chest with contrast on 07/10/2018 while she was on Eliquis showed bilateral pulmonary emboli, on the left lung new since previous diagnosis. -She is currently taking Lovenox without any bleeding issues.  4.  Weight loss: -She has stopped using tube feeds. -She is  maintaining her weight very well. -I will send her back to Dr. Constance Haw for PEG tube discontinuation.  5.  Elevated creatinine: -Creatinine today is 1.17.  Normal baseline is 0.9. -I have recommended her to drink a lot of fluids.  She was told to avoid NSAIDs.

## 2019-06-11 ENCOUNTER — Encounter: Payer: Self-pay | Admitting: General Surgery

## 2019-06-11 ENCOUNTER — Ambulatory Visit: Payer: Medicare HMO | Admitting: General Surgery

## 2019-06-11 ENCOUNTER — Other Ambulatory Visit: Payer: Self-pay

## 2019-06-11 VITALS — BP 148/84 | HR 82 | Temp 98.3°F | Resp 18 | Ht 67.0 in | Wt 173.0 lb

## 2019-06-11 DIAGNOSIS — Z431 Encounter for attention to gastrostomy: Secondary | ICD-10-CM

## 2019-06-11 NOTE — Progress Notes (Signed)
[facility] Note   HPI:  69 y.o. Female presents to clinic for removal of her PEG tube. She is not longer using it and says she wants it out.  Review of Systems:  Minimal drainage from around tube All other review of systems: otherwise negative   Vital Signs:  BP (!) 148/84 (BP Location: Left Arm, Patient Position: Sitting, Cuff Size: Normal)   Pulse 82   Temp 98.3 F (36.8 C) (Oral)   Resp 18   Ht 5\' 7"  (1.702 m)   Wt 173 lb (78.5 kg)   SpO2 97%   BMI 27.10 kg/m    Physical Exam:  Physical Exam Vitals reviewed.  HENT:     Head: Normocephalic.  Cardiovascular:     Rate and Rhythm: Normal rate.  Pulmonary:     Effort: Pulmonary effort is normal.  Abdominal:     General: There is no distension.     Palpations: Abdomen is soft.     Tenderness: There is no abdominal tenderness.     Comments: Gastrostomy tube in place, balloon deflated and tube removed, bandage placed  Skin:    General: Skin is warm and dry.  Neurological:     Mental Status: She is alert.       Assessment:  69 y.o. yo Female s/p PEG placed and she says the PEG I placed had already fallen out and was replaced with this one in the ED. I removed this one without issue.  Plan:  - Dry dressing to area of the next week or so    All of the above recommendations were discussed with the patient, and all of patient's questions were answered to her expressed satisfaction.  Curlene Labrum, MD [facility] [address] [place], [place] [address] [phone] (office)

## 2019-06-16 ENCOUNTER — Ambulatory Visit: Payer: Medicare HMO | Admitting: General Surgery

## 2019-06-19 DIAGNOSIS — M25511 Pain in right shoulder: Secondary | ICD-10-CM | POA: Diagnosis not present

## 2019-06-19 DIAGNOSIS — Z85118 Personal history of other malignant neoplasm of bronchus and lung: Secondary | ICD-10-CM | POA: Diagnosis not present

## 2019-06-24 ENCOUNTER — Other Ambulatory Visit (HOSPITAL_COMMUNITY): Payer: Self-pay | Admitting: *Deleted

## 2019-06-24 MED ORDER — OSIMERTINIB MESYLATE 40 MG PO TABS: 40.0000 mg | ORAL_TABLET | Freq: Every day | ORAL | 0 refills | Status: DC

## 2019-06-30 DIAGNOSIS — E119 Type 2 diabetes mellitus without complications: Secondary | ICD-10-CM | POA: Diagnosis not present

## 2019-06-30 DIAGNOSIS — H401133 Primary open-angle glaucoma, bilateral, severe stage: Secondary | ICD-10-CM | POA: Diagnosis not present

## 2019-06-30 DIAGNOSIS — H25811 Combined forms of age-related cataract, right eye: Secondary | ICD-10-CM | POA: Diagnosis not present

## 2019-07-03 ENCOUNTER — Ambulatory Visit (HOSPITAL_COMMUNITY)
Admission: RE | Admit: 2019-07-03 | Discharge: 2019-07-03 | Disposition: A | Discharge status: Home / Self Care | Payer: Medicare HMO | Source: Ambulatory Visit | Attending: Hematology | Admitting: Hematology

## 2019-07-03 ENCOUNTER — Encounter (HOSPITAL_COMMUNITY): Payer: Self-pay | Admitting: *Deleted

## 2019-07-03 ENCOUNTER — Telehealth (HOSPITAL_COMMUNITY): Payer: Self-pay | Admitting: *Deleted

## 2019-07-03 ENCOUNTER — Inpatient Hospital Stay (HOSPITAL_COMMUNITY): Payer: Medicare HMO | Attending: Hematology

## 2019-07-03 ENCOUNTER — Other Ambulatory Visit: Payer: Self-pay

## 2019-07-03 ENCOUNTER — Other Ambulatory Visit (HOSPITAL_COMMUNITY): Payer: Self-pay | Admitting: *Deleted

## 2019-07-03 DIAGNOSIS — Z79899 Other long term (current) drug therapy: Secondary | ICD-10-CM | POA: Insufficient documentation

## 2019-07-03 DIAGNOSIS — I10 Essential (primary) hypertension: Secondary | ICD-10-CM | POA: Diagnosis not present

## 2019-07-03 DIAGNOSIS — C7931 Secondary malignant neoplasm of brain: Secondary | ICD-10-CM | POA: Insufficient documentation

## 2019-07-03 DIAGNOSIS — R7989 Other specified abnormal findings of blood chemistry: Secondary | ICD-10-CM | POA: Insufficient documentation

## 2019-07-03 DIAGNOSIS — C3492 Malignant neoplasm of unspecified part of left bronchus or lung: Secondary | ICD-10-CM | POA: Insufficient documentation

## 2019-07-03 DIAGNOSIS — E119 Type 2 diabetes mellitus without complications: Secondary | ICD-10-CM | POA: Diagnosis not present

## 2019-07-03 DIAGNOSIS — Z85118 Personal history of other malignant neoplasm of bronchus and lung: Secondary | ICD-10-CM | POA: Diagnosis not present

## 2019-07-03 DIAGNOSIS — R634 Abnormal weight loss: Secondary | ICD-10-CM | POA: Diagnosis not present

## 2019-07-03 DIAGNOSIS — I2699 Other pulmonary embolism without acute cor pulmonale: Secondary | ICD-10-CM | POA: Diagnosis not present

## 2019-07-03 LAB — COMPREHENSIVE METABOLIC PANEL
ALT: 20 U/L (ref 0–44)
AST: 20 U/L (ref 15–41)
Albumin: 4.8 g/dL (ref 3.5–5.0)
Alkaline Phosphatase: 88 U/L (ref 38–126)
Anion gap: 10 (ref 5–15)
BUN: 22 mg/dL (ref 8–23)
CO2: 25 mmol/L (ref 22–32)
Calcium: 10.1 mg/dL (ref 8.9–10.3)
Chloride: 102 mmol/L (ref 98–111)
Creatinine, Ser: 1.07 mg/dL — ABNORMAL HIGH (ref 0.44–1.00)
GFR calc Af Amer: >60 mL/min (ref >60)
GFR calc non Af Amer: 53 mL/min — ABNORMAL LOW (ref >60)
Glucose, Bld: 72 mg/dL (ref 70–99)
Potassium: 3.9 mmol/L (ref 3.5–5.1)
Sodium: 137 mmol/L (ref 135–145)
Total Bilirubin: 0.7 mg/dL (ref 0.3–1.2)
Total Protein: 8.7 g/dL — ABNORMAL HIGH (ref 6.5–8.1)

## 2019-07-03 LAB — CBC WITH DIFFERENTIAL/PLATELET
Abs Immature Granulocytes: 0.01 10*3/uL (ref 0.00–0.07)
Basophils Absolute: 0 10*3/uL (ref 0.0–0.1)
Basophils Relative: 1 %
Eosinophils Absolute: 0.5 10*3/uL (ref 0.0–0.5)
Eosinophils Relative: 13 %
HCT: 48.5 % — ABNORMAL HIGH (ref 36.0–46.0)
Hemoglobin: 15.6 g/dL — ABNORMAL HIGH (ref 12.0–15.0)
Immature Granulocytes: 0 %
Lymphocytes Relative: 26 %
Lymphs Abs: 1 10*3/uL (ref 0.7–4.0)
MCH: 28.3 pg (ref 26.0–34.0)
MCHC: 32.2 g/dL (ref 30.0–36.0)
MCV: 87.9 fL (ref 80.0–100.0)
Monocytes Absolute: 0.4 10*3/uL (ref 0.1–1.0)
Monocytes Relative: 12 %
Neutro Abs: 1.8 10*3/uL (ref 1.7–7.7)
Neutrophils Relative %: 48 %
Platelets: 200 10*3/uL (ref 150–400)
RBC: 5.52 MIL/uL — ABNORMAL HIGH (ref 3.87–5.11)
RDW: 14 % (ref 11.5–15.5)
WBC: 3.7 10*3/uL — ABNORMAL LOW (ref 4.0–10.5)
nRBC: 0 % (ref 0.0–0.2)

## 2019-07-03 MED ORDER — IOHEXOL 300 MG/ML  SOLN
75.0000 mL | Freq: Once | INTRAMUSCULAR | Status: AC | PRN
  Administered 2019-07-03: 75 mL via INTRAVENOUS

## 2019-07-03 NOTE — Progress Notes (Signed)
Patient's refill for Tagrisso was faxed on 1/27.  She called today to check on the status of her prescription.  I called the pharmacy for her and they report that patient's assistance form has expired.  Patient can either mail in her form with signature or she can renew over the telephone.  Pharmacist advised for patient to call so that we can expedite this request since patient only has five pills left.  She said for patient to call [phone] today and they can fulfill the request and patient should get shipment by Tuesday.    I have attempted to call patient with no answer. Left voicemail with specific instructions.

## 2019-07-03 NOTE — Telephone Encounter (Signed)
Spoke with patient and informed her to call her specialty pharmacy at the number listed in D. Wilson, RN previous note so they can set up an expedited delivery of her [place].  She verbalizes understanding and agrees to do so.

## 2019-07-08 ENCOUNTER — Inpatient Hospital Stay (HOSPITAL_COMMUNITY): Payer: Medicare HMO | Admitting: Hematology

## 2019-07-08 ENCOUNTER — Other Ambulatory Visit: Payer: Self-pay

## 2019-07-08 ENCOUNTER — Encounter (HOSPITAL_COMMUNITY): Payer: Self-pay | Admitting: Hematology

## 2019-07-08 VITALS — BP 107/76 | HR 100 | Temp 97.7°F | Resp 20 | Wt 180.1 lb

## 2019-07-08 DIAGNOSIS — R634 Abnormal weight loss: Secondary | ICD-10-CM | POA: Diagnosis not present

## 2019-07-08 DIAGNOSIS — R059 Cough, unspecified: Secondary | ICD-10-CM

## 2019-07-08 DIAGNOSIS — E119 Type 2 diabetes mellitus without complications: Secondary | ICD-10-CM | POA: Diagnosis not present

## 2019-07-08 DIAGNOSIS — R7989 Other specified abnormal findings of blood chemistry: Secondary | ICD-10-CM | POA: Diagnosis not present

## 2019-07-08 DIAGNOSIS — I10 Essential (primary) hypertension: Secondary | ICD-10-CM | POA: Diagnosis not present

## 2019-07-08 DIAGNOSIS — C3492 Malignant neoplasm of unspecified part of left bronchus or lung: Secondary | ICD-10-CM | POA: Diagnosis not present

## 2019-07-08 DIAGNOSIS — C7931 Secondary malignant neoplasm of brain: Secondary | ICD-10-CM | POA: Diagnosis not present

## 2019-07-08 DIAGNOSIS — Z79899 Other long term (current) drug therapy: Secondary | ICD-10-CM | POA: Diagnosis not present

## 2019-07-08 DIAGNOSIS — I2699 Other pulmonary embolism without acute cor pulmonale: Secondary | ICD-10-CM | POA: Diagnosis not present

## 2019-07-08 DIAGNOSIS — R05 Cough: Secondary | ICD-10-CM | POA: Diagnosis not present

## 2019-07-08 MED ORDER — BENZONATATE 100 MG PO CAPS: 100.0000 mg | ORAL_CAPSULE | Freq: Three times a day (TID) | ORAL | 0 refills | Status: DC | PRN

## 2019-07-08 NOTE — Patient Instructions (Addendum)
[place] at [facility] Discharge Instructions  You were seen today by Dr. Delton Coombes. He went over your recent lab results. Tessalon pearls sent to your pharmacy for cough. He will see you back in 2 months for labs and follow up.   Thank you for choosing [place] at [facility] to provide your oncology and hematology care.  To afford each patient quality time with our provider, please arrive at least 15 minutes before your scheduled appointment time.   If you have a lab appointment with the [facility] please come in thru the  Main Entrance and check in at the main information desk  You need to re-schedule your appointment should you arrive 10 or more minutes late.  We strive to give you quality time with our providers, and arriving late affects you and other patients whose appointments are after yours.  Also, if you no show three or more times for appointments you may be dismissed from the clinic at the providers discretion.     Again, thank you for choosing [facility].  Our hope is that these requests will decrease the amount of time that you wait before being seen by our physicians.       _____________________________________________________________  Should you have questions after your visit to [facility], please contact our office at [phone] between the hours of 8:00 a.m. and 4:30 p.m.  Voicemails left after 4:00 p.m. will not be returned until the following business day.  For prescription refill requests, have your pharmacy contact our office and allow 72 hours.    Cancer Center Support Programs:   > Cancer Support Group  2nd Tuesday of the month 1pm-2pm, Journey Room

## 2019-07-08 NOTE — Progress Notes (Signed)
[place], [place] [address]   CLINIC:  Medical Oncology/Hematology  PCP:  [name], [place] [address] [phone]   REASON FOR VISIT:  Follow-up for metastatic lung cancer.     INTERVAL HISTORY:  [name] 69 y.o. female seen for follow-up of metastatic lung cancer.  She is tolerating osimertinib 40 mg very well.  Appetite and energy levels are 600% and 75% respectively.  No new onset pains reported.  Reports some cough mostly dry and occasional clear sputum.  Shortness of breath on exertion is stable.  Mild skin itching is also stable.  REVIEW OF SYSTEMS:  Review of Systems  Respiratory: Positive for cough and shortness of breath.   Skin: Positive for itching.  Psychiatric/Behavioral: Positive for sleep disturbance.  All other systems reviewed and are negative.    PAST MEDICAL/SURGICAL HISTORY:  Past Medical History:  Diagnosis Date  . Asthma   . Cancer ([place]) 04/21/2018   Lung Cancer   . Carpal tunnel syndrome   . Cataract    left eye  . Diabetes mellitus without complication ([place])   . DVT (deep venous thrombosis) ([place])   . Hypertension   . Pneumonia 04/21/2018  . Pulmonary embolism ([place]) 03/2018  . Sepsis [facility])    Past Surgical History:  Procedure Laterality Date  . ABDOMINAL HYSTERECTOMY    . biopsy of right breast    . ESOPHAGOGASTRODUODENOSCOPY (EGD) WITH PROPOFOL N/A 09/12/2018   Procedure: ESOPHAGOGASTRODUODENOSCOPY (EGD) WITH PROPOFOL (scope in 1017, scope out 1032);  Surgeon: Virl Cagey, MD;  Location: [facility];  Service: General;  Laterality: N/A;  . FOOT SURGERY    . implant left eye    . INTRAOCULAR LENS INSERTION    . IR GASTROSTOMY TUBE MOD SED  12/05/2018  . PEG PLACEMENT N/A 09/12/2018   Procedure: PERCUTANEOUS ENDOSCOPIC GASTROSTOMY (PEG) PLACEMENT;  Surgeon: Virl Cagey, MD;  Location: [facility];  Service: General;  Laterality: N/A;     SOCIAL HISTORY:   Social History   Socioeconomic History  . Marital status: Widowed    Spouse name: Not on file  . Number of children: 4  . Years of education: 36  . Highest education level: High school graduate  Occupational History  . Not on file  Tobacco Use  . Smoking status: Never Smoker  . Smokeless tobacco: Never Used  Substance and Sexual Activity  . Alcohol use: Not Currently    Comment: occ  . Drug use: No  . Sexual activity: Yes    Partners: Male    Birth control/protection: Surgical  Other Topics Concern  . 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   Social Determinants of Health   Financial Resource Strain:   . Difficulty of Paying Living Expenses: Not on file  Food Insecurity:   . Worried About Charity fundraiser in the Last Year: Not on file  . Ran Out of Food in the Last Year: Not on file  Transportation Needs:   . Lack of Transportation (Medical): Not on file  . Lack of Transportation (Non-Medical): Not on file  Physical Activity:   . Days of Exercise per Week: Not on file  . Minutes  of Exercise per Session: Not on file  Stress:   . Feeling of Stress : Not on file  Social Connections:   . Frequency of Communication with Friends and Family: Not on file  . Frequency of Social Gatherings with Friends and Family: Not on file  . Attends Religious Services: Not on file  . Active Member of Clubs or Organizations: Not on file  . Attends Archivist Meetings: Not on file  . Marital Status: Not on file  Intimate Partner Violence:   . Fear of Current or Ex-Partner: Not on file  . Emotionally Abused: Not on file  . Physically Abused: Not on file  . Sexually Abused: Not on file    FAMILY HISTORY:  Family History  Problem Relation Age of Onset  . Heart  failure Mother   . Stroke Mother   . Heart attack Mother   . Kidney failure Other   . Leukemia Father     CURRENT MEDICATIONS:  Outpatient Encounter Medications as of 07/08/2019  Medication Sig Note  . albuterol (PROVENTIL HFA;VENTOLIN HFA) 108 (90 BASE) MCG/ACT inhaler Inhale 2 puffs into the lungs every 6 (six) hours as needed for wheezing.   Marland Kitchen albuterol (PROVENTIL) (2.5 MG/3ML) 0.083% nebulizer solution Take 3 mLs (2.5 mg total) by nebulization every 6 (six) hours as needed for wheezing or shortness of breath.   Marland Kitchen amLODipine (NORVASC) 5 MG tablet Take 5 mg by mouth daily.   . benzonatate (TESSALON) 100 MG capsule Take 1 capsule (100 mg total) by mouth 3 (three) times daily as needed for cough.   . colchicine 0.6 MG tablet Take 1 tablet (0.6 mg total) by mouth daily. 12/02/2018: Not consistent in taking daily  . enoxaparin (LOVENOX) 150 MG/ML injection Inject 1 mL (150 mg total) into the skin daily.   Marland Kitchen FLOVENT HFA 44 MCG/ACT inhaler Inhale 2 puffs into the lungs daily.    Marland Kitchen HYDROcodone-homatropine (HYCODAN) 5-1.5 MG/5ML syrup Take 5 mLs by mouth every 6 (six) hours as needed for cough.   . hydrocortisone (ANUSOL-HC) 2.5 % rectal cream    . lactose free nutrition (BOOST) LIQD Take 237 mLs by mouth 4 (four) times daily.   . metoCLOPramide (REGLAN) 10 MG tablet Take 1 tablet (10 mg total) by mouth every 8 (eight) hours as needed for refractory nausea / vomiting.   Marland Kitchen osimertinib mesylate (TAGRISSO) 40 MG tablet Take 1 tablet (40 mg total) by mouth daily.   Marland Kitchen oxyCODONE-acetaminophen (PERCOCET/ROXICET) 5-325 MG tablet Take 1-2 tablets by mouth as needed.    . pantoprazole (PROTONIX) 40 MG tablet TAKE 1 TABLET BY MOUTH ONCE DAILY. 04/17/2019: Pt reports she take sometimes  . potassium chloride (KLOR-CON) 10 MEQ tablet TAKE (2) TABLETS BY MOUTH TWICE DAILY.   Marland Kitchen prochlorperazine (COMPAZINE) 10 MG tablet TAKE (1) TABLET BY MOUTH EVERY SIX HOURS AS NEEDED FOR NAUSEA OR VOMITING.   Marland Kitchen zolpidem (AMBIEN  CR) 6.25 MG CR tablet Take 1 tablet (6.25 mg total) by mouth at bedtime as needed for sleep.   . [DISCONTINUED] pantoprazole (PROTONIX) 40 MG tablet TAKE 1 TABLET BY MOUTH ONCE DAILY. (Patient not taking: Reported on 03/23/2019)   . [DISCONTINUED] predniSONE (DELTASONE) 10 MG tablet    . [DISCONTINUED] tobramycin-dexamethasone (TOBRADEX) ophthalmic solution Place into both eyes.    . [DISCONTINUED] Water For Irrigation, Sterile (FREE WATER) SOLN Place 200 mLs into feeding tube every 6 (six) hours. (Patient not taking: Reported on 07/08/2019)    No facility-administered encounter medications on file  as of 07/08/2019.    ALLERGIES:  Allergies  Allergen Reactions  . Banana Anaphylaxis  . Aspirin Other (See Comments)    G.I. Upset      PHYSICAL EXAM:  ECOG Performance status: 1  Vitals:   07/08/19 1104  BP: 107/76  Pulse: 100  Resp: 20  Temp: 97.7 F (36.5 C)  SpO2: 100%   Filed Weights   07/08/19 1104  Weight: 180 lb 1.6 oz (81.7 kg)    Physical Exam Vitals reviewed.  Constitutional:      Appearance: Normal appearance.  Cardiovascular:     Rate and Rhythm: Normal rate and regular rhythm.     Heart sounds: Normal heart sounds.  Pulmonary:     Effort: Pulmonary effort is normal.     Breath sounds: Normal breath sounds.  Abdominal:     General: There is no distension.     Palpations: Abdomen is soft. There is no mass.  Musculoskeletal:        General: No swelling.  Skin:    General: Skin is warm.  Neurological:     General: No focal deficit present.     Mental Status: She is alert and oriented to person, place, and time.  Psychiatric:        Mood and Affect: Mood normal.        Behavior: Behavior normal.      LABORATORY DATA:  I have reviewed the labs as listed.  CBC    Component Value Date/Time   WBC 3.7 (L) 07/03/2019 1150   RBC 5.52 (H) 07/03/2019 1150   HGB 15.6 (H) 07/03/2019 1150   HCT 48.5 (H) 07/03/2019 1150   PLT 200 07/03/2019 1150   MCV 87.9  07/03/2019 1150   MCH 28.3 07/03/2019 1150   MCHC 32.2 07/03/2019 1150   RDW 14.0 07/03/2019 1150   LYMPHSABS 1.0 07/03/2019 1150   MONOABS 0.4 07/03/2019 1150   EOSABS 0.5 07/03/2019 1150   BASOSABS 0.0 07/03/2019 1150   CMP Latest Ref Rng & Units 07/03/2019 06/02/2019 04/28/2019  Glucose 70 - 99 mg/dL 72 86 82  BUN 8 - 23 mg/dL 22 19 25(H)  Creatinine 0.44 - 1.00 mg/dL 1.07(H) 1.16(H) 0.97  Sodium 135 - 145 mmol/L 137 136 137  Potassium 3.5 - 5.1 mmol/L 3.9 4.2 4.6  Chloride 98 - 111 mmol/L 102 101 104  CO2 22 - 32 mmol/L '25 28 22  '$ Calcium 8.9 - 10.3 mg/dL 10.1 9.8 10.3  Total Protein 6.5 - 8.1 g/dL 8.7(H) 7.2 8.0  Total Bilirubin 0.3 - 1.2 mg/dL 0.7 0.6 0.3  Alkaline Phos 38 - 126 U/L 88 61 70  AST 15 - 41 U/L '20 18 18  '$ ALT 0 - 44 U/L '20 22 19       '$ DIAGNOSTIC IMAGING:  I have independently reviewed the scans and discussed with the patient.      ASSESSMENT & PLAN:   Metastatic lung cancer (metastasis from lung to other site), left ([place]) 1.  Metastatic adenocarcinoma of the left lung with multiple brain mets: -Guardant 360 showing EGFR exon 19 deletion.  Patient is a never smoker. - Osimertinib 80 mg daily started on 07/28/2018. -Osimertinib dose reduced to 40 mg daily around 12/08/2018.  This was done secondary to weight loss and decreased appetite. -He is continuing to tolerate osimertinib 40 mg daily very well. -We reviewed results of the CT chest from 07/03/2019.  No evidence of recurrence or metastatic disease. -She complained of dry cough from recent  upper respiratory infection.  We will give her Tessalon Perles 100 mg 3 times a day for 10 days. -I will reevaluate her in 2 months for follow-up with labs.   2.  Brain metastasis: -MRI of the brain on 05/30/2018 shows 20 subcentimeter enhancing brain lesions consistent with metastasis, no edema. -WBRT 30 Gy in 10 fractions and left lung 30 Gray in 10 fractions from 07/16/2018 - 07/29/2018 -MRI on 11/01/2018 showed excellent  response with only faint enhancement of a few residual subcentimeter left hemisphere lesions. -MRI of the brain on 02/19/2019 showed stable posttreatment appearance of the brain without evidence of new or progressive metastatic disease.  Unchanged 5 mm left frontal mass most consistent with small meningioma. -MRI of the brain reviewed by me from 06/02/2019 showed posttreatment stable appearance of brain with no new metastasis.  3.  Recurrent PE: -Most likely secondary to active malignancy.  CT PE protocol on 04/24/2018 shows PE in the right lower lobe pulmonary artery. -CT of the chest with contrast on 07/10/2018 while she was on Eliquis showed bilateral pulmonary emboli, on the left lung new since previous diagnosis. -She will continue Lovenox without any bleeding issues.  4.  Weight loss: -She is continuing to gain weight.  PEG tube was discontinued. -She is eating by mouth and not losing weight.  5.  Elevated creatinine: -Creatinine improved to 1.07.  Normal baseline is 0.9.  Previously it was 1.17.       Orders placed this encounter:  No orders of the defined types were placed in this encounter.     Derek Jack, MD [place] [phone]

## 2019-07-11 NOTE — Assessment & Plan Note (Signed)
1.  Metastatic adenocarcinoma of the left lung with multiple brain mets: -Guardant 360 showing EGFR exon 19 deletion.  Patient is a never smoker. - Osimertinib 80 mg daily started on 07/28/2018. -Osimertinib dose reduced to 40 mg daily around 12/08/2018.  This was done secondary to weight loss and decreased appetite. -He is continuing to tolerate osimertinib 40 mg daily very well. -We reviewed results of the CT chest from 07/03/2019.  No evidence of recurrence or metastatic disease. -She complained of dry cough from recent upper respiratory infection.  We will give her Tessalon Perles 100 mg 3 times a day for 10 days. -I will reevaluate her in 2 months for follow-up with labs.   2.  Brain metastasis: -MRI of the brain on 05/30/2018 shows 20 subcentimeter enhancing brain lesions consistent with metastasis, no edema. -WBRT 30 Gy in 10 fractions and left lung 30 Gray in 10 fractions from 07/16/2018 - 07/29/2018 -MRI on 11/01/2018 showed excellent response with only faint enhancement of a few residual subcentimeter left hemisphere lesions. -MRI of the brain on 02/19/2019 showed stable posttreatment appearance of the brain without evidence of new or progressive metastatic disease.  Unchanged 5 mm left frontal mass most consistent with small meningioma. -MRI of the brain reviewed by me from 06/02/2019 showed posttreatment stable appearance of brain with no new metastasis.  3.  Recurrent PE: -Most likely secondary to active malignancy.  CT PE protocol on 04/24/2018 shows PE in the right lower lobe pulmonary artery. -CT of the chest with contrast on 07/10/2018 while she was on Eliquis showed bilateral pulmonary emboli, on the left lung new since previous diagnosis. -She will continue Lovenox without any bleeding issues.  4.  Weight loss: -She is continuing to gain weight.  PEG tube was discontinued. -She is eating by mouth and not losing weight.  5.  Elevated creatinine: -Creatinine improved to 1.07.  Normal  baseline is 0.9.  Previously it was 1.17.

## 2019-07-14 DIAGNOSIS — H401132 Primary open-angle glaucoma, bilateral, moderate stage: Secondary | ICD-10-CM | POA: Diagnosis not present

## 2019-07-29 DIAGNOSIS — C3492 Malignant neoplasm of unspecified part of left bronchus or lung: Secondary | ICD-10-CM | POA: Diagnosis not present

## 2019-07-29 DIAGNOSIS — J4540 Moderate persistent asthma, uncomplicated: Secondary | ICD-10-CM | POA: Diagnosis not present

## 2019-07-31 ENCOUNTER — Other Ambulatory Visit: Payer: Self-pay | Admitting: *Deleted

## 2019-08-04 ENCOUNTER — Telehealth: Payer: Self-pay | Admitting: Radiation Therapy

## 2019-08-04 ENCOUNTER — Other Ambulatory Visit: Payer: Self-pay | Admitting: Radiation Therapy

## 2019-08-04 DIAGNOSIS — C7931 Secondary malignant neoplasm of brain: Secondary | ICD-10-CM

## 2019-08-04 NOTE — Progress Notes (Signed)
MDT

## 2019-08-04 NOTE — Telephone Encounter (Signed)
Left a detailed message about her upcoming brain MRI on 4/8 and telephone follow-up with Ashlyln on 4/14. I included my contact information for her to call back with any questions or if she has a conflict.   Mont Dutton R.T.(R)(T) Radiation Special Procedures Navigator

## 2019-08-05 ENCOUNTER — Telehealth (HOSPITAL_COMMUNITY): Payer: Self-pay | Admitting: *Deleted

## 2019-08-05 ENCOUNTER — Other Ambulatory Visit (HOSPITAL_COMMUNITY): Payer: Self-pay | Admitting: *Deleted

## 2019-08-05 MED ORDER — OSIMERTINIB MESYLATE 40 MG PO TABS: 40.0000 mg | ORAL_TABLET | Freq: Every day | ORAL | 0 refills | Status: DC

## 2019-08-05 NOTE — Telephone Encounter (Signed)
Pt called requesting refill on Tagrisso.  Rx faxed to [facility] with confirmation of receipt.

## 2019-08-06 DIAGNOSIS — H401133 Primary open-angle glaucoma, bilateral, severe stage: Secondary | ICD-10-CM | POA: Diagnosis not present

## 2019-08-06 DIAGNOSIS — E119 Type 2 diabetes mellitus without complications: Secondary | ICD-10-CM | POA: Diagnosis not present

## 2019-08-06 DIAGNOSIS — Z961 Presence of intraocular lens: Secondary | ICD-10-CM | POA: Diagnosis not present

## 2019-08-06 DIAGNOSIS — Z01818 Encounter for other preprocedural examination: Secondary | ICD-10-CM | POA: Diagnosis not present

## 2019-08-06 DIAGNOSIS — H25811 Combined forms of age-related cataract, right eye: Secondary | ICD-10-CM | POA: Diagnosis not present

## 2019-08-18 ENCOUNTER — Encounter (HOSPITAL_COMMUNITY): Payer: Self-pay | Admitting: *Deleted

## 2019-08-18 NOTE — Progress Notes (Signed)
Patient called stating that she has been out of her tagrisso for a while now and wanted to check on the refill.  I confirmed with her that we sent it on march 10th and received confirmation from the pharmacy.  I called [facility] and they confirmed that they did get the prescription but had overlooked its need to be filled.  The pharmacist Kennyth Lose, confirmed that she would expedite the prescription to Ms. Christo.    I called Ms. Enck's home and her grandson answered and said he could let her know that it was on its way via mail.  I told him to have Stanton Kidney or Judson Roch call me back if they had not received it by Thursday this week. He verbalizes understanding.

## 2019-08-27 DIAGNOSIS — H409 Unspecified glaucoma: Secondary | ICD-10-CM | POA: Diagnosis not present

## 2019-08-27 DIAGNOSIS — H25811 Combined forms of age-related cataract, right eye: Secondary | ICD-10-CM | POA: Diagnosis not present

## 2019-08-27 DIAGNOSIS — H401133 Primary open-angle glaucoma, bilateral, severe stage: Secondary | ICD-10-CM | POA: Diagnosis not present

## 2019-08-27 DIAGNOSIS — H401113 Primary open-angle glaucoma, right eye, severe stage: Secondary | ICD-10-CM | POA: Diagnosis not present

## 2019-09-03 ENCOUNTER — Ambulatory Visit (HOSPITAL_COMMUNITY): Payer: Medicare HMO

## 2019-09-08 ENCOUNTER — Inpatient Hospital Stay (HOSPITAL_COMMUNITY): Payer: Medicare HMO | Attending: Hematology | Admitting: Hematology

## 2019-09-08 ENCOUNTER — Inpatient Hospital Stay (HOSPITAL_COMMUNITY): Payer: Medicare HMO

## 2019-09-08 ENCOUNTER — Other Ambulatory Visit: Payer: Self-pay

## 2019-09-08 VITALS — BP 120/74 | HR 79 | Temp 96.8°F | Resp 18 | Wt 190.0 lb

## 2019-09-08 DIAGNOSIS — R634 Abnormal weight loss: Secondary | ICD-10-CM | POA: Insufficient documentation

## 2019-09-08 DIAGNOSIS — Z7901 Long term (current) use of anticoagulants: Secondary | ICD-10-CM | POA: Insufficient documentation

## 2019-09-08 DIAGNOSIS — E119 Type 2 diabetes mellitus without complications: Secondary | ICD-10-CM | POA: Diagnosis not present

## 2019-09-08 DIAGNOSIS — I10 Essential (primary) hypertension: Secondary | ICD-10-CM | POA: Diagnosis not present

## 2019-09-08 DIAGNOSIS — R7989 Other specified abnormal findings of blood chemistry: Secondary | ICD-10-CM | POA: Insufficient documentation

## 2019-09-08 DIAGNOSIS — Z79899 Other long term (current) drug therapy: Secondary | ICD-10-CM | POA: Diagnosis not present

## 2019-09-08 DIAGNOSIS — C7931 Secondary malignant neoplasm of brain: Secondary | ICD-10-CM

## 2019-09-08 DIAGNOSIS — C3491 Malignant neoplasm of unspecified part of right bronchus or lung: Secondary | ICD-10-CM

## 2019-09-08 DIAGNOSIS — C3492 Malignant neoplasm of unspecified part of left bronchus or lung: Secondary | ICD-10-CM

## 2019-09-08 LAB — CBC WITH DIFFERENTIAL/PLATELET
Abs Immature Granulocytes: 0.02 10*3/uL (ref 0.00–0.07)
Basophils Absolute: 0 10*3/uL (ref 0.0–0.1)
Basophils Relative: 1 %
Eosinophils Absolute: 0.4 10*3/uL (ref 0.0–0.5)
Eosinophils Relative: 10 %
HCT: 46.2 % — ABNORMAL HIGH (ref 36.0–46.0)
Hemoglobin: 14.3 g/dL (ref 12.0–15.0)
Immature Granulocytes: 1 %
Lymphocytes Relative: 22 %
Lymphs Abs: 0.8 10*3/uL (ref 0.7–4.0)
MCH: 27.7 pg (ref 26.0–34.0)
MCHC: 31 g/dL (ref 30.0–36.0)
MCV: 89.5 fL (ref 80.0–100.0)
Monocytes Absolute: 0.5 10*3/uL (ref 0.1–1.0)
Monocytes Relative: 13 %
Neutro Abs: 2 10*3/uL (ref 1.7–7.7)
Neutrophils Relative %: 53 %
Platelets: 181 10*3/uL (ref 150–400)
RBC: 5.16 MIL/uL — ABNORMAL HIGH (ref 3.87–5.11)
RDW: 14.2 % (ref 11.5–15.5)
WBC: 3.7 10*3/uL — ABNORMAL LOW (ref 4.0–10.5)
nRBC: 0 % (ref 0.0–0.2)

## 2019-09-08 LAB — COMPREHENSIVE METABOLIC PANEL
ALT: 29 U/L (ref 0–44)
AST: 19 U/L (ref 15–41)
Albumin: 4 g/dL (ref 3.5–5.0)
Alkaline Phosphatase: 74 U/L (ref 38–126)
Anion gap: 9 (ref 5–15)
BUN: 19 mg/dL (ref 8–23)
CO2: 26 mmol/L (ref 22–32)
Calcium: 9.4 mg/dL (ref 8.9–10.3)
Chloride: 103 mmol/L (ref 98–111)
Creatinine, Ser: 1.03 mg/dL — ABNORMAL HIGH (ref 0.44–1.00)
GFR calc Af Amer: >60 mL/min (ref >60)
GFR calc non Af Amer: 56 mL/min — ABNORMAL LOW (ref >60)
Glucose, Bld: 91 mg/dL (ref 70–99)
Potassium: 3.9 mmol/L (ref 3.5–5.1)
Sodium: 138 mmol/L (ref 135–145)
Total Bilirubin: 0.7 mg/dL (ref 0.3–1.2)
Total Protein: 7.1 g/dL (ref 6.5–8.1)

## 2019-09-08 LAB — MAGNESIUM: Magnesium: 2 mg/dL (ref 1.7–2.4)

## 2019-09-08 NOTE — Patient Instructions (Signed)
[place] at [facility] Discharge Instructions  You were seen today by Dr. Delton Coombes. He went over your recent lab results. He will see you back in 2 months for labs, scan and follow up.   Thank you for choosing [place] at [facility] to provide your oncology and hematology care.  To afford each patient quality time with our provider, please arrive at least 15 minutes before your scheduled appointment time.   If you have a lab appointment with the [facility] please come in thru the  Main Entrance and check in at the main information desk  You need to re-schedule your appointment should you arrive 10 or more minutes late.  We strive to give you quality time with our providers, and arriving late affects you and other patients whose appointments are after yours.  Also, if you no show three or more times for appointments you may be dismissed from the clinic at the providers discretion.     Again, thank you for choosing [facility]  Our hope is that these requests will decrease the amount of time that you wait before being seen by our physicians.       _____________________________________________________________  Should you have questions after your visit to [facility], please contact our office at [phone] between the hours of 8:00 a.m. and 4:30 p.m.  Voicemails left after 4:00 p.m. will not be returned until the following business day.  For prescription refill requests, have your pharmacy contact our office and allow 72 hours.    Cancer Center Support Programs:   > Cancer Support Group  2nd Tuesday of the month 1pm-2pm, Journey Room

## 2019-09-08 NOTE — Assessment & Plan Note (Signed)
1.  Metastatic adenocarcinoma of the left lung with multiple brain mets: -Guardant 360 with EGFR exon 19 deletion.  Patient never smoker. -Osimertinib 80 mg daily started on 07/28/2018.  Dose reduced due to weight loss and decreased appetite to 40 mg daily in July 2020. -She is continuing to tolerate osimertinib very well. -CT of the chest on 07/03/2019 did not show any evidence of recurrence or metastatic disease. -I reviewed her labs today.  White count is 3.7 with platelet count of 181.  LFTs are normal. -I plan to see her back in 2 months with repeat CT scan of the chest.  We will also repeat labs.  2.  Brain metastasis: -MRI of the brain at diagnosis showed 20 subcentimeter enhancing brain lesions.  Whole brain RT completed on 07/29/2018. -Last MRI on 06/02/2019 showed posttreatment stable appearance with no new meta stasis. -She is scheduled for MRI of the brain this Saturday.  We will follow up on it.  3.  Recurrent PE: -Most likely secondary to active malignancy.  She will continue Lovenox without any problems.  4.  Weight loss: -This has subsided when we cut back on the dose.  PEG tube was discontinued few months ago.  She is continuing to gain weight.  5.  Elevated creatinine: -Creatinine today is 1.03.  This is from osimertinib.  We will closely monitor it.  This is improved since we cut back on the dose.

## 2019-09-08 NOTE — Progress Notes (Signed)
[place], [place] [address]   CLINIC:  Medical Oncology/Hematology  PCP:  [name], [address] [place] [id number] [phone]   REASON FOR VISIT:  Follow-up for metastatic lung cancer.     INTERVAL HISTORY:  Kaitlin [name] 69 y.o. female seen for follow-up of metastatic lung cancer.  She is tolerating osimertinib 40 mg very well.  Appetite is 100%.  Energy levels are 75%.  Gained 10 pounds since last visit.  She is eating well.  Shortness of breath on exertion is stable.  Occasional dizziness at bedtime present.  No vision changes or headaches.  REVIEW OF SYSTEMS:  Review of Systems  Respiratory: Positive for shortness of breath.   Neurological: Positive for dizziness.  All other systems reviewed and are negative.    PAST MEDICAL/SURGICAL HISTORY:  Past Medical History:  Diagnosis Date  . Asthma   . Cancer ([place]) 04/21/2018   Lung Cancer   . Carpal tunnel syndrome   . Cataract    left eye  . Diabetes mellitus without complication ([place])   . DVT (deep venous thrombosis) ([place])   . Hypertension   . Pneumonia 04/21/2018  . Pulmonary embolism ([place]) 03/2018  . Sepsis [facility])    Past Surgical History:  Procedure Laterality Date  . ABDOMINAL HYSTERECTOMY    . biopsy of right breast    . ESOPHAGOGASTRODUODENOSCOPY (EGD) WITH PROPOFOL N/A 09/12/2018   Procedure: ESOPHAGOGASTRODUODENOSCOPY (EGD) WITH PROPOFOL (scope in 1017, scope out 1032);  Surgeon: Virl Cagey, MD;  Location: [facility];  Service: General;  Laterality: N/A;  . FOOT SURGERY    . implant left eye    . INTRAOCULAR LENS INSERTION    . IR GASTROSTOMY TUBE MOD SED  12/05/2018  . PEG PLACEMENT N/A 09/12/2018   Procedure: PERCUTANEOUS ENDOSCOPIC GASTROSTOMY (PEG) PLACEMENT;  Surgeon: Virl Cagey, MD;  Location: [facility];  Service: General;  Laterality: N/A;     SOCIAL HISTORY:  Social History   Socioeconomic History  . Marital status: Widowed    Spouse name: Not on file  . Number of children: 4  . Years of education: 31  . Highest education level: High school graduate  Occupational History  . Not on file  Tobacco Use  . Smoking status: Never Smoker  . Smokeless tobacco: Never Used  Substance and Sexual Activity  . Alcohol use: Not Currently    Comment: occ  . Drug use: No  . Sexual activity: Yes    Partners: Male    Birth control/protection: Surgical  Other Topics Concern  . 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   Social Determinants of Health   Financial Resource Strain:   . Difficulty of Paying Living Expenses:   Food Insecurity:   . Worried About Charity fundraiser in the Last Year:   . Arboriculturist in the Last Year:   Transportation Needs:   . Film/video editor (Medical):   Marland Kitchen Lack of Transportation (Non-Medical):   Physical Activity:   . Days of Exercise per Week:   . Minutes of Exercise per Session:   Stress:   . Feeling of Stress :   Social Connections:   .  Frequency of Communication with Friends and Family:   . Frequency of Social Gatherings with Friends and Family:   . Attends Religious Services:   . Active Member of Clubs or Organizations:   . Attends Archivist Meetings:   Marland Kitchen Marital Status:   Intimate Partner Violence:   . Fear of Current or Ex-Partner:   . Emotionally Abused:   Marland Kitchen Physically Abused:   . Sexually Abused:     FAMILY HISTORY:  Family History  Problem Relation Age of Onset  . Heart failure Mother   . Stroke Mother   . Heart attack Mother   . Kidney failure Other   . Leukemia Father     CURRENT MEDICATIONS:  Outpatient Encounter Medications as of 09/08/2019  Medication Sig Note  . amLODipine (NORVASC) 5 MG tablet Take 5 mg by mouth daily.   Marland Kitchen  enoxaparin (LOVENOX) 150 MG/ML injection Inject 1 mL (150 mg total) into the skin daily.   Marland Kitchen FLOVENT HFA 44 MCG/ACT inhaler Inhale 2 puffs into the lungs daily.    . pantoprazole (PROTONIX) 40 MG tablet TAKE 1 TABLET BY MOUTH ONCE DAILY. 04/17/2019: Pt reports she take sometimes  . potassium chloride (KLOR-CON) 10 MEQ tablet TAKE (2) TABLETS BY MOUTH TWICE DAILY.   Marland Kitchen albuterol (PROVENTIL HFA;VENTOLIN HFA) 108 (90 BASE) MCG/ACT inhaler Inhale 2 puffs into the lungs every 6 (six) hours as needed for wheezing.   Marland Kitchen albuterol (PROVENTIL) (2.5 MG/3ML) 0.083% nebulizer solution Take 3 mLs (2.5 mg total) by nebulization every 6 (six) hours as needed for wheezing or shortness of breath. (Patient not taking: Reported on 09/08/2019)   . benzonatate (TESSALON) 100 MG capsule Take 1 capsule (100 mg total) by mouth 3 (three) times daily as needed for cough. (Patient not taking: Reported on 09/08/2019)   . colchicine 0.6 MG tablet Take 1 tablet (0.6 mg total) by mouth daily. (Patient not taking: Reported on 09/08/2019) 12/02/2018: Not consistent in taking daily  . HYDROcodone-homatropine (HYCODAN) 5-1.5 MG/5ML syrup Take 5 mLs by mouth every 6 (six) hours as needed for cough.   . hydrocortisone (ANUSOL-HC) 2.5 % rectal cream    . metoCLOPramide (REGLAN) 10 MG tablet Take 1 tablet (10 mg total) by mouth every 8 (eight) hours as needed for refractory nausea / vomiting. (Patient not taking: Reported on 09/08/2019)   . osimertinib mesylate (TAGRISSO) 40 MG tablet Take 1 tablet (40 mg total) by mouth daily.   Marland Kitchen oxyCODONE-acetaminophen (PERCOCET/ROXICET) 5-325 MG tablet Take 1-2 tablets by mouth as needed.    . prochlorperazine (COMPAZINE) 10 MG tablet TAKE (1) TABLET BY MOUTH EVERY SIX HOURS AS NEEDED FOR NAUSEA OR VOMITING. (Patient not taking: Reported on 09/08/2019)   . zolpidem (AMBIEN CR) 6.25 MG CR tablet Take 1 tablet (6.25 mg total) by mouth at bedtime as needed for sleep. (Patient not taking: Reported on 09/08/2019)   .  [DISCONTINUED] lactose free nutrition (BOOST) LIQD Take 237 mLs by mouth 4 (four) times daily.   . [DISCONTINUED] pantoprazole (PROTONIX) 40 MG tablet TAKE 1 TABLET BY MOUTH ONCE DAILY. (Patient not taking: Reported on 03/23/2019)    No facility-administered encounter medications on file as of 09/08/2019.    ALLERGIES:  Allergies  Allergen Reactions  . Banana Anaphylaxis  . Aspirin Other (See Comments)    G.I. Upset      PHYSICAL EXAM:  ECOG Performance status: 1  Vitals:   09/08/19 1039  BP: 120/74  Pulse: 79  Resp: 18  Temp: (!) 96.8  F (36 C)  SpO2: 100%   Filed Weights   09/08/19 1039  Weight: 190 lb (86.2 kg)    Physical Exam Vitals reviewed.  Constitutional:      Appearance: Normal appearance.  Cardiovascular:     Rate and Rhythm: Normal rate and regular rhythm.     Heart sounds: Normal heart sounds.  Pulmonary:     Effort: Pulmonary effort is normal.     Breath sounds: Normal breath sounds.  Abdominal:     General: There is no distension.     Palpations: Abdomen is soft. There is no mass.  Musculoskeletal:        General: No swelling.  Skin:    General: Skin is warm.  Neurological:     General: No focal deficit present.     Mental Status: She is alert and oriented to person, place, and time.  Psychiatric:        Mood and Affect: Mood normal.        Behavior: Behavior normal.      LABORATORY DATA:  I have reviewed the labs as listed.  CBC    Component Value Date/Time   WBC 3.7 (L) 09/08/2019 0941   RBC 5.16 (H) 09/08/2019 0941   HGB 14.3 09/08/2019 0941   HCT 46.2 (H) 09/08/2019 0941   PLT 181 09/08/2019 0941   MCV 89.5 09/08/2019 0941   MCH 27.7 09/08/2019 0941   MCHC 31.0 09/08/2019 0941   RDW 14.2 09/08/2019 0941   LYMPHSABS 0.8 09/08/2019 0941   MONOABS 0.5 09/08/2019 0941   EOSABS 0.4 09/08/2019 0941   BASOSABS 0.0 09/08/2019 0941   CMP Latest Ref Rng & Units 09/08/2019 07/03/2019 06/02/2019  Glucose 70 - 99 mg/dL 91 72 86  BUN 8 -  23 mg/dL _0 Creatinine 0.44 - 1.00 mg/dL 1.03(H) 1.07(H) 1.16(H)  Sodium 135 - 145 mmol/L 138 137 136  Potassium 3.5 - 5.1 mmol/L 3.9 3.9 4.2  Chloride 98 - 111 mmol/L 103 102 101  CO2 22 - 32 mmol/L _1 Calcium 8.9 - 10.3 mg/dL 9.4 10.1 9.8  Total Protein 6.5 - 8.1 g/dL 7.1 8.7(H) 7.2  Total Bilirubin 0.3 - 1.2 mg/dL 0.7 0.7 0.6  Alkaline Phos 38 - 126 U/L 74 88 61  AST 15 - 41 U/L _2 ALT 0 - 44 U/L _3 DIAGNOSTIC IMAGING:  I have reviewed scans.      ASSESSMENT & PLAN:   Metastatic lung cancer (metastasis from lung to other site), left ([place]) 1.  Metastatic adenocarcinoma of the left lung with multiple brain mets: -Guardant 360 with EGFR exon 19 deletion.  Patient never smoker. -Osimertinib 80 mg daily started on 07/28/2018.  Dose reduced due to weight loss and decreased appetite to 40 mg daily in July 2020. -She is continuing to tolerate osimertinib very well. -CT of the chest on 07/03/2019 did not show any evidence of recurrence or metastatic disease. -I reviewed her labs today.  White count is 3.7 with platelet count of 181.  LFTs are normal. -I plan to see her back in 2 months with repeat CT scan of the chest.  We will also repeat labs.  2.  Brain metastasis: -MRI of the brain at diagnosis showed 20 subcentimeter enhancing brain lesions.  Whole brain RT completed on 07/29/2018. -Last MRI on 06/02/2019 showed posttreatment stable appearance with no new meta stasis. -She is scheduled for MRI of  the brain this Saturday.  We will follow up on it.  3.  Recurrent PE: -Most likely secondary to active malignancy.  She will continue Lovenox without any problems.  4.  Weight loss: -This has subsided when we cut back on the dose.  PEG tube was discontinued few months ago.  She is continuing to gain weight.  5.  Elevated creatinine: -Creatinine today is 1.03.  This is from osimertinib.  We will closely monitor it.  This is improved since we cut back on  the dose.     Orders placed this encounter:  Orders Placed This Encounter  Procedures  . CT Chest W Contrast      Derek Jack, MD [place] [phone]

## 2019-09-09 ENCOUNTER — Telehealth: Payer: Self-pay | Admitting: Urology

## 2019-09-12 ENCOUNTER — Ambulatory Visit (HOSPITAL_COMMUNITY)
Admission: RE | Admit: 2019-09-12 | Discharge: 2019-09-12 | Disposition: A | Discharge status: Home / Self Care | Payer: Medicare HMO | Source: Ambulatory Visit | Attending: Radiation Oncology | Admitting: Radiation Oncology

## 2019-09-12 ENCOUNTER — Other Ambulatory Visit: Payer: Self-pay

## 2019-09-12 DIAGNOSIS — C7931 Secondary malignant neoplasm of brain: Secondary | ICD-10-CM | POA: Diagnosis not present

## 2019-09-12 DIAGNOSIS — C3490 Malignant neoplasm of unspecified part of unspecified bronchus or lung: Secondary | ICD-10-CM | POA: Diagnosis not present

## 2019-09-12 MED ORDER — GADOBUTROL 1 MMOL/ML IV SOLN
8.0000 mL | Freq: Once | INTRAVENOUS | Status: AC | PRN
  Administered 2019-09-12: 7.5 mL via INTRAVENOUS

## 2019-09-14 ENCOUNTER — Inpatient Hospital Stay: Payer: Medicare HMO | Attending: Radiation Oncology

## 2019-09-15 ENCOUNTER — Other Ambulatory Visit: Payer: Self-pay

## 2019-09-16 ENCOUNTER — Ambulatory Visit
Admission: RE | Admit: 2019-09-16 | Discharge: 2019-09-16 | Disposition: A | Discharge status: Home / Self Care | Payer: Medicare HMO | Source: Ambulatory Visit | Attending: Urology | Admitting: Urology

## 2019-09-16 DIAGNOSIS — Z85118 Personal history of other malignant neoplasm of bronchus and lung: Secondary | ICD-10-CM | POA: Diagnosis not present

## 2019-09-16 DIAGNOSIS — C7931 Secondary malignant neoplasm of brain: Secondary | ICD-10-CM | POA: Diagnosis not present

## 2019-09-16 DIAGNOSIS — Z08 Encounter for follow-up examination after completed treatment for malignant neoplasm: Secondary | ICD-10-CM | POA: Diagnosis not present

## 2019-09-16 NOTE — Progress Notes (Signed)
Radiation Oncology         [phone] ________________________________  Name: Kaitlin [name] MRN: [id number]  Date: 09/16/2019  DOB: September 08, 1950  Post Treatment Note  CC: Kaitlin Perches, MD  Kaitlin Perches, MD  Diagnosis:   69 y.o. female with metastatic NSCLC of the left lower lobe lung with significant mediastinal/hilar lymph node involvement andat least20 subcentimeter brain metastases.  Interval Since Last Radiation:  1 year  07/16/2018 - 07/29/2018:   1. The left lung was treated to 30 Gy in 10 fractions of 3 Gy. 2. The whole brain was treated to 30 Gy in 10 fractions of 3 Gy.  Narrative:  I spoke with the patient to conduct her routine scheduled 3 month follow up visit to review results of her recent follow up MRI brain scan via telephone to spare the patient unnecessary potential exposure in the healthcare setting during the current COVID-19 pandemic.  She has recovered well from the effects of her radiotherapy and remains without complaints.  Her brain MRI from 09/12/19 shows an excellent response to whole-brain XRT, with stable post treatment appearance of the brain without evidence of new or progressive metastatic disease and an unchanged subcentimeter meningiomas overlying the bilateral frontal lobes.  This scan was reviewed in the most recent multidisciplinary conference on 09/14/19 and consensus recommendation is to continue with routine 3 month imaging.                 On review of systems, the patient states that she is doing well overall.  She has continued with improved appetite since her dose of Osimertinib was reduced to 40 mg daily in 10/2018 and she continues to tolerate systemic treatment well.  She was able to have her PEG tube removed in 05/2019 and is eating and drinking very well and maintaining her weight. She denies abdominal pain and reports resolution of dysphagia and throat irritation.  She denies chest pain, productive cough, hemoptysis or increased shortness of breath.  She is gradually getting her energy and strength back and denies headaches, dizziness/imbalance, focal weakness, tremor, changes in visual or auditory acuity, tinnitus or seizure activity.  She has continued on targeted systemic therapy with Osimertinib at a reduced dose to 40 mg under the care and direction of Dr. Ellin Saba.  Her most recent systemic imaging from 07/03/2019 showed stable postradiation changes of in the medial aspects of both hemi thoraces and left lower lobe without evidence of adenopathy or disease progression. She had a recent follow up visit with Dr. Ellin Saba on 09/08/2019 and the plan is to continue her current systemic regimen and repeat systemic imaging of the chest in 10/2019 with a follow up visit thereafter to review results.  ALLERGIES:  is allergic to banana and aspirin.  Meds: Current Outpatient Medications  Medication Sig Dispense Refill  . albuterol (PROVENTIL HFA;VENTOLIN HFA) 108 (90 BASE) MCG/ACT inhaler Inhale 2 puffs into the lungs every 6 (six) hours as needed for wheezing.    Marland Kitchen amLODipine (NORVASC) 5 MG tablet Take 5 mg by mouth daily.    Marland Kitchen enoxaparin (LOVENOX) 150 MG/ML injection Inject 1 mL (150 mg total) into the skin daily. 0 mL   . FLOVENT HFA 44 MCG/ACT inhaler Inhale 2 puffs into the lungs daily.     Marland Kitchen HYDROcodone-homatropine (HYCODAN) 5-1.5 MG/5ML syrup Take 5 mLs by mouth every 6 (six) hours as needed for cough.    . hydrocortisone (ANUSOL-HC) 2.5 % rectal cream     . osimertinib mesylate (  TAGRISSO) 40 MG tablet Take 1 tablet (40 mg total) by mouth daily. 30 tablet 0  . oxyCODONE-acetaminophen (PERCOCET/ROXICET) 5-325 MG tablet Take 1-2 tablets by mouth as needed.     . pantoprazole (PROTONIX) 40 MG tablet TAKE 1 TABLET BY MOUTH ONCE DAILY. 30 tablet 0  . potassium chloride (KLOR-CON) 10 MEQ tablet TAKE (2) TABLETS BY MOUTH TWICE DAILY. 120 tablet 0  . albuterol (PROVENTIL) (2.5 MG/3ML) 0.083% nebulizer solution Take 3 mLs (2.5 mg total) by nebulization  every 6 (six) hours as needed for wheezing or shortness of breath. (Patient not taking: Reported on 09/08/2019) 75 mL 2  . benzonatate (TESSALON) 100 MG capsule Take 1 capsule (100 mg total) by mouth 3 (three) times daily as needed for cough. (Patient not taking: Reported on 09/08/2019) 30 capsule 0  . colchicine 0.6 MG tablet Take 1 tablet (0.6 mg total) by mouth daily. (Patient not taking: Reported on 09/08/2019) 15 tablet 0  . metoCLOPramide (REGLAN) 10 MG tablet Take 1 tablet (10 mg total) by mouth every 8 (eight) hours as needed for refractory nausea / vomiting. (Patient not taking: Reported on 09/08/2019) 30 tablet 0  . prochlorperazine (COMPAZINE) 10 MG tablet TAKE (1) TABLET BY MOUTH EVERY SIX HOURS AS NEEDED FOR NAUSEA OR VOMITING. (Patient not taking: Reported on 09/08/2019) 30 tablet 10  . zolpidem (AMBIEN CR) 6.25 MG CR tablet Take 1 tablet (6.25 mg total) by mouth at bedtime as needed for sleep. (Patient not taking: Reported on 09/15/2019) 30 tablet 0   No current facility-administered medications for this encounter.    Physical Findings: Unable to assess due to to telephone format used for this visit.  Lab Findings: Lab Results  Component Value Date   WBC 3.7 (L) 09/08/2019   HGB 14.3 09/08/2019   HCT 46.2 (H) 09/08/2019   MCV 89.5 09/08/2019   PLT 181 09/08/2019     Radiographic Findings: MR Brain W Wo Contrast  Result Date: 09/13/2019 CLINICAL DATA:  Metastasis to brain. Follow-up treated metastatic disease; brain/CNS neoplasm, surveillance. Additional history obtained from previous radiology records: Metastatic lung cancer, status post whole brain radiation therapy. EXAM: MRI HEAD WITHOUT AND WITH CONTRAST TECHNIQUE: Multiplanar, multiecho pulse sequences of the brain and surrounding structures were obtained without and with intravenous contrast. CONTRAST:  7.76mL GADAVIST GADOBUTROL 1 MMOL/ML IV SOLN COMPARISON:  Prior brain MRI examinations 06/02/2019 and earlier FINDINGS:  Brain: Stable faint punctate residual enhancement at site of previously treated lesions within the left temporal stem (series 9, image 72) (series 10, image 24), within the left frontal operculum (series 9, image 81) (series 10, image 14) and within the left middle frontal gyrus (series 9, image 104). There is no significant edema associated with these lesions. Increased conspicuity of a 3 mm focus of enhancement at site of a treated anterior left temporal lobe lesion (series 9, image 55) (series 10, image 17). No associated edema at this site. No new enhancing intracranial lesion is identified. Stable 6 mm enhancing extra-axial dural-based mass along the left frontal convexity consistent with meningioma (series 9, image 114) (remeasured on prior). 4 mm enhancing extra-axial dural-based mass along the right frontal convexity (series 9, image 137). Findings also consistent with meningioma. In retrospect, this was present on prior exams dating back to 05/30/2018 and has remained stable since that time. There is no evidence of acute infarct. No midline shift or extra-axial fluid collection. No chronic intracranial blood products. Stable mild generalized parenchymal atrophy and minimal chronic small vessel ischemic  disease. Partially empty sella. Vascular: Flow voids maintained within the proximal large arterial vessels. Skull and upper cervical spine: No focal marrow lesion Sinuses/Orbits: No acute orbital abnormality. Postsurgical appearance of the paranasal sinuses with mild mucosal thickening. Unchanged chronic polypoid soft tissue within the nasal cavity bilaterally. Small bilateral mastoid effusions. IMPRESSION: Increased conspicuity of a 3 mm focus of enhancement at site of a treated anterior left temporal lobe lesion. It is suspected that this increased conspicuity is related to improved imaging quality on today's exam and there is no associated edema. However, attention is recommended on follow-up. Otherwise  stable post treatment appearance of the brain as compared to MRI 06/02/2019. No new intracranial metastases. Unchanged subcentimeter meningiomas overlying the bilateral frontal lobes. Electronically Signed   By: Jackey Loge DO   On: 09/13/2019 16:22    Impression/Plan:  This visit was conducted via telephone to spare the patient unnecessary potential exposure in the healthcare setting during the current COVID-19 pandemic.  1. 69 y.o. female with metastatic NSCLC of the left lower lobe lung with significant mediastinal/hilar lymph node involvement andat least20 subcentimeter brain metastases. She has recovered well from the effects of her radiotherapy and remains both clinically and radiographically stable. We reviewed her most recent brain MRI results from 09/12/19 which shows an excellent response to whole-brain XRT and disease stability without evidence for new or progressive lesions.  This was reviewed at the recent multidisciplinary brain tumor boards on 09/14/19 and the consensus recommendation is to continue with serial brain MRI every 3 months to monitor for any signs of disease recurrence or progression.  We will tentatively plan to see her back in the office following each scan to review findings and recommndations once the COVID-19 restrictions have been lifted and it is felt safe.  Otherwise, I will continue to call her for telephone follow up until that time.  She will also continue in routine follow up with Dr. Ellin Saba for management of her systemic disease treatment.  The current plan is to continue on targeted systemic therapy with Osimertinib at a reduced dose of 40 mg daily.  She appears to have a good understanding of our recommendations and is in agreement with the stated plan. She knows to call at any time in the interim with questions or concerns related to her previous radiation.  Given current concerns for patient exposure during the COVID-19 pandemic, this encounter was conducted  via telephone. The patient was notified in advance and was offered a WebEX meeting to allow for face to face communication but unfortunately reported that she did not have the appropriate resources/technology to support such a visit and instead preferred to proceed with telephone consult. The patient has given verbal consent for this type of encounter. The time spent during this encounter was 15 minutes and 50% of that time was spent in the coordination of her care. The attendants for this meeting include Nelsy Madonna PA-C, and patient, Lilyanah Martorella. [place]. During the encounter, Tambria Pfannenstiel PA-C was located at [facility] Radiation Oncology Department.  Patient, Markeyta Goodfellow. [place] was located at home.    Marguarite Arbour, PA-C

## 2019-09-22 ENCOUNTER — Other Ambulatory Visit (HOSPITAL_COMMUNITY): Payer: Self-pay | Admitting: *Deleted

## 2019-09-22 MED ORDER — OSIMERTINIB MESYLATE 40 MG PO TABS: 40.0000 mg | ORAL_TABLET | Freq: Every day | ORAL | 2 refills | Status: DC

## 2019-10-13 DIAGNOSIS — H401133 Primary open-angle glaucoma, bilateral, severe stage: Secondary | ICD-10-CM | POA: Diagnosis not present

## 2019-10-28 DIAGNOSIS — Z6831 Body mass index (BMI) 31.0-31.9, adult: Secondary | ICD-10-CM | POA: Diagnosis not present

## 2019-10-28 DIAGNOSIS — J4540 Moderate persistent asthma, uncomplicated: Secondary | ICD-10-CM | POA: Diagnosis not present

## 2019-10-28 DIAGNOSIS — I10 Essential (primary) hypertension: Secondary | ICD-10-CM | POA: Diagnosis not present

## 2019-10-28 DIAGNOSIS — E1159 Type 2 diabetes mellitus with other circulatory complications: Secondary | ICD-10-CM | POA: Diagnosis not present

## 2019-11-06 ENCOUNTER — Inpatient Hospital Stay (HOSPITAL_COMMUNITY): Payer: Medicare HMO

## 2019-11-06 ENCOUNTER — Ambulatory Visit (HOSPITAL_COMMUNITY): Admission: RE | Admit: 2019-11-06 | Payer: Medicare HMO | Source: Ambulatory Visit

## 2019-11-10 ENCOUNTER — Encounter (HOSPITAL_COMMUNITY): Payer: Self-pay | Admitting: *Deleted

## 2019-11-11 ENCOUNTER — Ambulatory Visit (HOSPITAL_COMMUNITY): Payer: Medicare HMO | Admitting: Hematology

## 2019-11-11 ENCOUNTER — Encounter (HOSPITAL_COMMUNITY): Payer: Self-pay | Admitting: *Deleted

## 2019-11-16 ENCOUNTER — Other Ambulatory Visit: Payer: Self-pay | Admitting: *Deleted

## 2019-11-16 ENCOUNTER — Telehealth: Payer: Self-pay | Admitting: *Deleted

## 2019-11-16 DIAGNOSIS — C7931 Secondary malignant neoplasm of brain: Secondary | ICD-10-CM

## 2019-11-24 ENCOUNTER — Other Ambulatory Visit (HOSPITAL_COMMUNITY)
Admission: RE | Admit: 2019-11-24 | Discharge: 2019-11-24 | Disposition: A | Discharge status: Home / Self Care | Payer: Medicare HMO | Source: Ambulatory Visit | Attending: Internal Medicine | Admitting: Internal Medicine

## 2019-11-24 ENCOUNTER — Inpatient Hospital Stay (HOSPITAL_COMMUNITY): Payer: Medicare HMO | Attending: Hematology

## 2019-11-24 ENCOUNTER — Other Ambulatory Visit: Payer: Self-pay

## 2019-11-24 DIAGNOSIS — C7931 Secondary malignant neoplasm of brain: Secondary | ICD-10-CM | POA: Diagnosis not present

## 2019-11-24 DIAGNOSIS — C3491 Malignant neoplasm of unspecified part of right bronchus or lung: Secondary | ICD-10-CM

## 2019-11-24 DIAGNOSIS — C3492 Malignant neoplasm of unspecified part of left bronchus or lung: Secondary | ICD-10-CM | POA: Diagnosis not present

## 2019-11-24 DIAGNOSIS — E1159 Type 2 diabetes mellitus with other circulatory complications: Secondary | ICD-10-CM | POA: Diagnosis not present

## 2019-11-24 LAB — CBC WITH DIFFERENTIAL/PLATELET
Abs Immature Granulocytes: 0.01 10*3/uL (ref 0.00–0.07)
Basophils Absolute: 0.1 10*3/uL (ref 0.0–0.1)
Basophils Relative: 1 %
Eosinophils Absolute: 0.4 10*3/uL (ref 0.0–0.5)
Eosinophils Relative: 10 %
HCT: 48.5 % — ABNORMAL HIGH (ref 36.0–46.0)
Hemoglobin: 15.4 g/dL — ABNORMAL HIGH (ref 12.0–15.0)
Immature Granulocytes: 0 %
Lymphocytes Relative: 24 %
Lymphs Abs: 0.9 10*3/uL (ref 0.7–4.0)
MCH: 28.1 pg (ref 26.0–34.0)
MCHC: 31.8 g/dL (ref 30.0–36.0)
MCV: 88.5 fL (ref 80.0–100.0)
Monocytes Absolute: 0.5 10*3/uL (ref 0.1–1.0)
Monocytes Relative: 14 %
Neutro Abs: 1.8 10*3/uL (ref 1.7–7.7)
Neutrophils Relative %: 51 %
Platelets: 258 10*3/uL (ref 150–400)
RBC: 5.48 MIL/uL — ABNORMAL HIGH (ref 3.87–5.11)
RDW: 13.4 % (ref 11.5–15.5)
WBC: 3.7 10*3/uL — ABNORMAL LOW (ref 4.0–10.5)
nRBC: 0 % (ref 0.0–0.2)

## 2019-11-24 LAB — COMPREHENSIVE METABOLIC PANEL
ALT: 16 U/L (ref 0–44)
AST: 19 U/L (ref 15–41)
Albumin: 4.1 g/dL (ref 3.5–5.0)
Alkaline Phosphatase: 79 U/L (ref 38–126)
Anion gap: 11 (ref 5–15)
BUN: 11 mg/dL (ref 8–23)
CO2: 26 mmol/L (ref 22–32)
Calcium: 9.7 mg/dL (ref 8.9–10.3)
Chloride: 103 mmol/L (ref 98–111)
Creatinine, Ser: 0.98 mg/dL (ref 0.44–1.00)
GFR calc Af Amer: >60 mL/min (ref >60)
GFR calc non Af Amer: 59 mL/min — ABNORMAL LOW (ref >60)
Glucose, Bld: 86 mg/dL (ref 70–99)
Potassium: 4 mmol/L (ref 3.5–5.1)
Sodium: 140 mmol/L (ref 135–145)
Total Bilirubin: 0.5 mg/dL (ref 0.3–1.2)
Total Protein: 7.6 g/dL (ref 6.5–8.1)

## 2019-11-25 ENCOUNTER — Ambulatory Visit (HOSPITAL_COMMUNITY)
Admission: RE | Admit: 2019-11-25 | Discharge: 2019-11-25 | Disposition: A | Discharge status: Home / Self Care | Payer: Medicare HMO | Source: Ambulatory Visit | Attending: Hematology | Admitting: Hematology

## 2019-11-25 DIAGNOSIS — J189 Pneumonia, unspecified organism: Secondary | ICD-10-CM | POA: Diagnosis not present

## 2019-11-25 DIAGNOSIS — J8410 Pulmonary fibrosis, unspecified: Secondary | ICD-10-CM | POA: Diagnosis not present

## 2019-11-25 DIAGNOSIS — Z5111 Encounter for antineoplastic chemotherapy: Secondary | ICD-10-CM | POA: Diagnosis not present

## 2019-11-25 DIAGNOSIS — C3492 Malignant neoplasm of unspecified part of left bronchus or lung: Secondary | ICD-10-CM | POA: Diagnosis not present

## 2019-11-25 LAB — HEMOGLOBIN A1C
Hgb A1c MFr Bld: 6.2 % — ABNORMAL HIGH (ref 4.8–5.6)
Mean Plasma Glucose: 131 mg/dL

## 2019-11-25 MED ORDER — IOHEXOL 300 MG/ML  SOLN
75.0000 mL | Freq: Once | INTRAMUSCULAR | Status: AC | PRN
  Administered 2019-11-25: 75 mL via INTRAVENOUS

## 2019-12-02 ENCOUNTER — Other Ambulatory Visit: Payer: Self-pay

## 2019-12-02 ENCOUNTER — Inpatient Hospital Stay (HOSPITAL_COMMUNITY): Payer: Medicare HMO | Attending: Hematology | Admitting: Hematology

## 2019-12-02 VITALS — BP 121/85 | HR 98 | Temp 97.7°F | Resp 18 | Wt 195.4 lb

## 2019-12-02 DIAGNOSIS — C3492 Malignant neoplasm of unspecified part of left bronchus or lung: Secondary | ICD-10-CM | POA: Insufficient documentation

## 2019-12-02 DIAGNOSIS — C7931 Secondary malignant neoplasm of brain: Secondary | ICD-10-CM | POA: Insufficient documentation

## 2019-12-02 DIAGNOSIS — I10 Essential (primary) hypertension: Secondary | ICD-10-CM | POA: Diagnosis not present

## 2019-12-02 DIAGNOSIS — Z79899 Other long term (current) drug therapy: Secondary | ICD-10-CM | POA: Insufficient documentation

## 2019-12-02 NOTE — Progress Notes (Signed)
Kaitlin [name], [place] [address]   CLINIC:  Medical Oncology/Hematology  PCP:  [name], MD [address] / [place] [address] [phone]   REASON FOR VISIT:  Follow-up for metastatic lung cancer  PRIOR THERAPY: None  CURRENT THERAPY: Osimertinib  BRIEF ONCOLOGIC HISTORY:  Oncology History   No history exists.    CANCER STAGING: Cancer Staging No matching staging information was found for the patient.  INTERVAL HISTORY:  Kaitlin [name], a 69 y.o. female, returns for routine follow-up of her metastatic lung cancer. Asta was last seen on 09/08/2019.  Today she reports that she is tolerating osimertinib well and denies any nausea. Her appetite is okay. Her only complaint is her asthma. She also complains of dizziness when she stands up.  She is scheduled for a repeat MRI brain on 12/21/2019.   REVIEW OF SYSTEMS:  Review of Systems  Constitutional: Positive for fatigue (mild). Negative for appetite change.  Respiratory: Positive for cough and shortness of breath.   Cardiovascular: Negative for leg swelling.  Gastrointestinal: Negative for nausea.  Neurological: Positive for dizziness (when standing up).  Psychiatric/Behavioral: Positive for sleep disturbance.  All other systems reviewed and are negative.   PAST MEDICAL/SURGICAL HISTORY:  Past Medical History:  Diagnosis Date  . Asthma   . Cancer ([facility] [place] [facility]) 04/21/2018   Lung Cancer   . Carpal tunnel syndrome   . Cataract    left eye  . Diabetes mellitus without complication ([place])   . DVT (deep venous thrombosis) ([place])   . Hypertension   . Pneumonia 04/21/2018  . Pulmonary embolism ([place]) 03/2018  . Sepsis [facility])    Past Surgical History:  Procedure Laterality Date  . ABDOMINAL HYSTERECTOMY    . biopsy of right breast    . ESOPHAGOGASTRODUODENOSCOPY (EGD) WITH PROPOFOL N/A 09/12/2018   Procedure: ESOPHAGOGASTRODUODENOSCOPY (EGD) WITH PROPOFOL (scope in 1017,  scope out 1032);  Surgeon: Virl Cagey, MD;  Location: [facility];  Service: General;  Laterality: N/A;  . FOOT SURGERY    . implant left eye    . INTRAOCULAR LENS INSERTION    . IR GASTROSTOMY TUBE MOD SED  12/05/2018  . PEG PLACEMENT N/A 09/12/2018   Procedure: PERCUTANEOUS ENDOSCOPIC GASTROSTOMY (PEG) PLACEMENT;  Surgeon: Virl Cagey, MD;  Location: [facility];  Service: General;  Laterality: N/A;    SOCIAL HISTORY:  Social History   Socioeconomic History  . Marital status: Widowed    Spouse name: Not on file  . Number of children: 4  . Years of education: 58  . Highest education level: High school graduate  Occupational History  . Not on file  Tobacco Use  . Smoking status: Never Smoker  . Smokeless tobacco: Never Used  Vaping Use  . Vaping Use: Never used  Substance and Sexual Activity  . Alcohol use: Not Currently    Comment: occ  . Drug use: No  . Sexual activity: Yes    Partners: Male    Birth control/protection: Surgical  Other Topics Concern  . 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   Social Determinants of Health   Financial Resource Strain:   . Difficulty of Paying Living Expenses:   Food  Insecurity:   . Worried About Charity fundraiser in the Last Year:   . Arboriculturist in the Last Year:   Transportation Needs:   . Film/video editor (Medical):   Marland Kitchen Lack of Transportation (Non-Medical):   Physical Activity:   . Days of Exercise per Week:   . Minutes of Exercise per Session:   Stress:   . Feeling of Stress :   Social Connections:   . Frequency of Communication with Friends and Family:   . Frequency of Social Gatherings with Friends and Family:   . Attends Religious Services:   . Active Member of Clubs or Organizations:   .  Attends Archivist Meetings:   Marland Kitchen Marital Status:   Intimate Partner Violence:   . Fear of Current or Ex-Partner:   . Emotionally Abused:   Marland Kitchen Physically Abused:   . Sexually Abused:     FAMILY HISTORY:  Family History  Problem Relation Age of Onset  . Heart failure Mother   . Stroke Mother   . Heart attack Mother   . Kidney failure Other   . Leukemia Father     CURRENT MEDICATIONS:  Current Outpatient Medications  Medication Sig Dispense Refill  . amLODipine (NORVASC) 5 MG tablet Take 5 mg by mouth daily.    . colchicine 0.6 MG tablet Take 1 tablet (0.6 mg total) by mouth daily. 15 tablet 0  . enoxaparin (LOVENOX) 150 MG/ML injection Inject 1 mL (150 mg total) into the skin daily. 0 mL   . FLOVENT HFA 44 MCG/ACT inhaler Inhale 2 puffs into the lungs daily.     . hydrocortisone (ANUSOL-HC) 2.5 % rectal cream     . metoCLOPramide (REGLAN) 10 MG tablet Take 1 tablet (10 mg total) by mouth every 8 (eight) hours as needed for refractory nausea / vomiting. 30 tablet 0  . osimertinib mesylate (TAGRISSO) 40 MG tablet Take 1 tablet (40 mg total) by mouth daily. 30 tablet 2  . oxyCODONE-acetaminophen (PERCOCET/ROXICET) 5-325 MG tablet Take 1-2 tablets by mouth as needed.     . pantoprazole (PROTONIX) 40 MG tablet TAKE 1 TABLET BY MOUTH ONCE DAILY. 30 tablet 0  . potassium chloride (KLOR-CON) 10 MEQ tablet TAKE (2) TABLETS BY MOUTH TWICE DAILY. 120 tablet 0  . albuterol (PROVENTIL HFA;VENTOLIN HFA) 108 (90 BASE) MCG/ACT inhaler Inhale 2 puffs into the lungs every 6 (six) hours as needed for wheezing. (Patient not taking: Reported on 12/02/2019)    . albuterol (PROVENTIL) (2.5 MG/3ML) 0.083% nebulizer solution Take 3 mLs (2.5 mg total) by nebulization every 6 (six) hours as needed for wheezing or shortness of breath. (Patient not taking: Reported on 12/02/2019) 75 mL 2  . benzonatate (TESSALON) 100 MG capsule Take 1 capsule (100 mg total) by mouth 3 (three) times daily as needed for cough.  (Patient not taking: Reported on 12/02/2019) 30 capsule 0  . HYDROcodone-homatropine (HYCODAN) 5-1.5 MG/5ML syrup Take 5 mLs by mouth every 6 (six) hours as needed for cough. (Patient not taking: Reported on 12/02/2019)    . prochlorperazine (COMPAZINE) 10 MG tablet TAKE (1) TABLET BY MOUTH EVERY SIX HOURS AS NEEDED FOR NAUSEA OR VOMITING. (Patient not taking: Reported on 12/02/2019) 30 tablet 10  . zolpidem (AMBIEN CR) 6.25 MG CR tablet Take 1 tablet (6.25 mg total) by mouth at bedtime as needed for sleep. (Patient not taking: Reported on 12/02/2019) 30 tablet 0   No current facility-administered medications for this visit.  ALLERGIES:  Allergies  Allergen Reactions  . Banana Anaphylaxis  . Aspirin Other (See Comments)    G.I. Upset     PHYSICAL EXAM:  Performance status (ECOG): 1 - Symptomatic but completely ambulatory  Vitals:   12/02/19 1554  BP: 121/85  Pulse: 98  Resp: 18  Temp: 97.7 F (36.5 C)  SpO2: 98%   Wt Readings from Last 3 Encounters:  12/02/19 88.6 kg (195 lb 6.4 oz)  09/08/19 86.2 kg (190 lb)  07/08/19 81.7 kg (180 lb 1.6 oz)   Physical Exam Vitals reviewed.  Constitutional:      Appearance: Normal appearance.  Cardiovascular:     Rate and Rhythm: Normal rate and regular rhythm.     Pulses: Normal pulses.     Heart sounds: Normal heart sounds.  Pulmonary:     Effort: Pulmonary effort is normal.     Breath sounds: Normal breath sounds.  Musculoskeletal:     Right lower leg: No edema.     Left lower leg: No edema.  Neurological:     General: No focal deficit present.     Mental Status: She is alert and oriented to person, place, and time.  Psychiatric:        Mood and Affect: Mood normal.        Behavior: Behavior normal.      LABORATORY DATA:  I have reviewed the labs as listed.  CBC Latest Ref Rng & Units 11/24/2019 09/08/2019 07/03/2019  WBC 4.0 - 10.5 K/uL 3.7(L) 3.7(L) 3.7(L)  Hemoglobin 12.0 - 15.0 g/dL 15.4(H) 14.3 15.6(H)  Hematocrit 36 - 46 %  48.5(H) 46.2(H) 48.5(H)  Platelets 150 - 400 K/uL 258 181 200   CMP Latest Ref Rng & Units 11/24/2019 09/08/2019 07/03/2019  Glucose 70 - 99 mg/dL 86 91 72  BUN 8 - 23 mg/dL '11 19 22  '$ Creatinine 0.44 - 1.00 mg/dL 0.98 1.03(H) 1.07(H)  Sodium 135 - 145 mmol/L 140 138 137  Potassium 3.5 - 5.1 mmol/L 4.0 3.9 3.9  Chloride 98 - 111 mmol/L 103 103 102  CO2 22 - 32 mmol/L '26 26 25  '$ Calcium 8.9 - 10.3 mg/dL 9.7 9.4 10.1  Total Protein 6.5 - 8.1 g/dL 7.6 7.1 8.7(H)  Total Bilirubin 0.3 - 1.2 mg/dL 0.5 0.7 0.7  Alkaline Phos 38 - 126 U/L 79 74 88  AST 15 - 41 U/L '19 19 20  '$ ALT 0 - 44 U/L '16 29 20    '$ DIAGNOSTIC IMAGING:  I have independently reviewed the scans and discussed with the patient. CT Chest W Contrast  Result Date: 11/25/2019 CLINICAL DATA:  Metastatic left lung cancer, status post radiation therapy and chemotherapy EXAM: CT CHEST WITH CONTRAST TECHNIQUE: Multidetector CT imaging of the chest was performed during intravenous contrast administration. CONTRAST:  67m OMNIPAQUE IOHEXOL 300 MG/ML  SOLN COMPARISON:  07/03/2019, 03/20/2019 FINDINGS: Cardiovascular: No significant vascular findings. Normal heart size. No pericardial effusion. Mediastinum/Nodes: No discretely enlarged mediastinal or hilar lymph nodes. Unchanged post treatment appearance of soft tissue thickening about the mediastinum and bilateral hila. Thyroid gland, trachea, and esophagus demonstrate no significant findings. Lungs/Pleura: Unchanged post treatment appearance of the lungs with bandlike perihilar consolidation and fibrosis of the bilateral upper lobes (series 4, image 56) as well as the superior segment right lower lobe (series 4, image 72) and the left lung base (series 4, image 99). Unchanged trace left pleural effusion. Upper Abdomen: No acute abnormality. Musculoskeletal: No chest wall mass or suspicious bone lesions identified. IMPRESSION:  1. Unchanged post treatment appearance of the lungs with bandlike perihilar  consolidation and fibrosis of the bilateral upper lobes as well as the superior segment right lower lobe and the left lung base. 2. Unchanged post treatment appearance of mediastinal and bilateral hilar soft tissue thickening without discretely enlarged lymph nodes. 3. No evidence of recurrent or new metastatic disease in the chest. 4. Unchanged trace left pleural effusion. Electronically Signed   By: Eddie Candle M.D.   On: 11/25/2019 12:37     ASSESSMENT:  1.  Metastatic adenocarcinoma of the left lung with multiple brain mets: -Guardant 360 with EGFR exon 19 deletion.  Patient never smoker. -Osimertinib 80 mg daily started on 07/28/2018, dose reduced due to weight loss and decreased appetite in July 2020. -CT chest on 11/25/2019 shows stable disease with no evidence of recurrence or metastasis.  2.  Brain metastasis: -20 subcentimeter brain lesions and diagnosis. -Whole brain RT completed on 07/29/2018. -MRI of the brain with and without contrast on 09/12/2019 showed increased conspicuity of a 3 mm focus of enhancement at site of treated anterior left temporal lobe lesion.  Increased conspicuity is related to improved imaging quality on the present exam.  No new intracranial metastasis.    PLAN:  1.  Metastatic adenocarcinoma of the left lung with multiple brain mets: -I have reviewed results of the CT scan with the patient in detail. -Continue osimertinib 40 mg daily. -RTC 3 months with repeat scan.  2.  Brain metastasis: -We reviewed results of brain MRI.  3.  Recurrent PE: -Continue Lovenox indefinitely.  4.  Chronic cough: -Continue Hycodan as needed.    Orders placed this encounter:  No orders of the defined types were placed in this encounter.    Derek Jack, MD [place] [phone]   I, Milinda Antis, am acting as a scribe for Dr. Sanda Linger.  I, Derek Jack MD, have reviewed the above documentation for accuracy and  completeness, and I agree with the above.

## 2019-12-02 NOTE — Patient Instructions (Signed)
[facility] at [facility] Discharge Instructions  You were seen today by Dr. Katragadda. He went over your recent results and scans. Dr. Katragadda will see you back in 3 months for labs and follow up.   Thank you for choosing [facility] at [facility] to provide your oncology and hematology care.  To afford each patient quality time with our provider, please arrive at least 15 minutes before your scheduled appointment time.   If you have a lab appointment with the Cancer Center please come in thru the Main Entrance and check in at the main information desk  You need to re-schedule your appointment should you arrive 10 or more minutes late.  We strive to give you quality time with our providers, and arriving late affects you and other patients whose appointments are after yours.  Also, if you no show three or more times for appointments you may be dismissed from the clinic at the providers discretion.     Again, thank you for choosing [facility].  Our hope is that these requests will decrease the amount of time that you wait before being seen by our physicians.       _____________________________________________________________  Should you have questions after your visit to [facility], please contact our office at [phone] between the hours of 8:00 a.m. and 4:30 p.m.  Voicemails left after 4:00 p.m. will not be returned until the following business day.  For prescription refill requests, have your pharmacy contact our office and allow 72 hours.    Cancer Center Support Programs:   > Cancer Support Group  2nd Tuesday of the month 1pm-2pm, Journey Room    

## 2019-12-03 ENCOUNTER — Encounter (HOSPITAL_COMMUNITY): Payer: Self-pay | Admitting: *Deleted

## 2019-12-03 NOTE — Progress Notes (Signed)
Patient called clinic today with questions about her visit yesterday. She wanted clarification on her brain scans and what they mean. I explained the results and the need for further scans to reassess at an earlier interval.  I explained that she will get the results during her telephone visit with Ashlyn Brunning on 7/28.  She verbalizes understanding.

## 2019-12-04 MED ORDER — COLCHICINE 0.6 MG PO CAPS: 0.6000 mg | ORAL_CAPSULE | Freq: Every day | ORAL | 3 refills | Status: DC | PRN

## 2019-12-04 MED ORDER — HYDROCODONE-HOMATROPINE 5-1.5 MG/5ML PO SYRP: 5.0000 mL | ORAL_SOLUTION | Freq: Four times a day (QID) | ORAL | 0 refills | Status: DC | PRN

## 2019-12-21 ENCOUNTER — Other Ambulatory Visit: Payer: Self-pay

## 2019-12-21 ENCOUNTER — Ambulatory Visit (HOSPITAL_COMMUNITY)
Admission: RE | Admit: 2019-12-21 | Discharge: 2019-12-21 | Disposition: A | Discharge status: Home / Self Care | Payer: Medicare HMO | Source: Ambulatory Visit | Attending: Radiation Oncology | Admitting: Radiation Oncology

## 2019-12-21 DIAGNOSIS — G9389 Other specified disorders of brain: Secondary | ICD-10-CM | POA: Diagnosis not present

## 2019-12-21 DIAGNOSIS — C7931 Secondary malignant neoplasm of brain: Secondary | ICD-10-CM | POA: Diagnosis not present

## 2019-12-21 DIAGNOSIS — G319 Degenerative disease of nervous system, unspecified: Secondary | ICD-10-CM | POA: Diagnosis not present

## 2019-12-21 DIAGNOSIS — I6782 Cerebral ischemia: Secondary | ICD-10-CM | POA: Diagnosis not present

## 2019-12-21 MED ORDER — GADOBUTROL 1 MMOL/ML IV SOLN
9.0000 mL | Freq: Once | INTRAVENOUS | Status: AC | PRN
  Administered 2019-12-21: 9 mL via INTRAVENOUS

## 2019-12-22 ENCOUNTER — Telehealth: Payer: Self-pay

## 2019-12-22 NOTE — Telephone Encounter (Signed)
Left voicemail message to call back in regards to follow-up telephone appointment with Freeman Caldron PA tomorrow at 2:00pm. Called to review meaningful use questions. TM

## 2019-12-22 NOTE — Telephone Encounter (Signed)
Left patient a voicemail message to call back in regards to telephone follow- up visit with Kaitlin Bruning PA on 12/23/19 at 2:00pm. Called to review meaningful use questions prior to visit. TM

## 2019-12-23 ENCOUNTER — Other Ambulatory Visit: Payer: Self-pay

## 2019-12-23 ENCOUNTER — Ambulatory Visit
Admission: RE | Admit: 2019-12-23 | Discharge: 2019-12-23 | Disposition: A | Discharge status: Home / Self Care | Payer: Medicare HMO | Source: Ambulatory Visit | Attending: Urology | Admitting: Urology

## 2019-12-23 DIAGNOSIS — Z08 Encounter for follow-up examination after completed treatment for malignant neoplasm: Secondary | ICD-10-CM | POA: Diagnosis not present

## 2019-12-23 DIAGNOSIS — C7931 Secondary malignant neoplasm of brain: Secondary | ICD-10-CM

## 2019-12-23 DIAGNOSIS — R42 Dizziness and giddiness: Secondary | ICD-10-CM | POA: Diagnosis not present

## 2019-12-23 DIAGNOSIS — Z79899 Other long term (current) drug therapy: Secondary | ICD-10-CM | POA: Diagnosis not present

## 2019-12-23 DIAGNOSIS — Z85118 Personal history of other malignant neoplasm of bronchus and lung: Secondary | ICD-10-CM | POA: Diagnosis not present

## 2019-12-23 NOTE — Progress Notes (Signed)
Radiation Oncology         [phone] ________________________________  Name: Kaitlin [name] MRN: [id number]  Date: 12/23/2019  DOB: [birth year]  Post Treatment Note  CC: Kaitlin Perches, MD  Kaitlin Perches, MD  Diagnosis:   69 y.o. female with metastatic NSCLC of the left lower lobe lung with significant mediastinal/hilar lymph node involvement andat least20 subcentimeter brain metastases.  Interval Since Last Radiation:  1 year, 4 months 07/16/2018 - 07/29/2018:   1. The left lung was treated to 30 Gy in 10 fractions of 3 Gy. 2. The whole brain was treated to 30 Gy in 10 fractions of 3 Gy.  Narrative:  I spoke with the patient to conduct her routine scheduled 3 month follow up visit to review results of her recent follow up MRI brain scan via telephone to spare the patient unnecessary potential exposure in the healthcare setting during the current COVID-19 pandemic.  She has recovered well from the effects of her radiotherapy and remains without complaints.  Her brain MRI from 12/21/19 shows an excellent response to whole-brain XRT, with stable post treatment appearance of the brain without evidence of new or progressive metastatic disease and an unchanged appearance of the subcentimeter meningiomas overlying the bilateral frontal lobes.  This scan was reviewed with the patient and the recommendation is to continue with routine 3 month imaging.                 On review of systems, the patient states that she is doing well overall.  She has continued with improved appetite since her dose of Osimertinib (Tagrisso) was reduced to 40 mg daily in 10/2018 and she continues to tolerate systemic treatment well.  She was able to have her PEG tube removed in 05/2019 and is eating and drinking very well and maintaining her weight. She denies abdominal pain and denies any dysphagia or throat irritation.  She denies chest pain, productive cough, hemoptysis or increased shortness of breath. She continues to  gradually regain her energy and strength and denies headaches, focal weakness, tremor, changes in visual or auditory acuity, tinnitus or seizure activity.  She has developed some dizziness/imbalance over the past 7-10 days that feels similar to vertigo that she has had in the past. She has not sought any evaluation of this as of yet but has previously been treated for this condition with her PCP, Dr. Ouida [name]. She has continued on targeted systemic therapy with Osimertinib at a reduced dose to 40 mg under the care and direction of Dr. Ellin [name].  Her most recent systemic imaging from 11/25/2019 showed stable postradiation changes in the medial aspects of both hemithoraces and left lower lobe without evidence of recurrent or new metastatic disease in the chest. She had a recent follow up visit with Dr. Ellin [name] on 12/02/2019 and the plan is to continue her current systemic regimen and repeat systemic imaging of the chest in 02/2020 with a follow up visit thereafter to review results.  ALLERGIES:  is allergic to banana and aspirin.  Meds: Current Outpatient Medications  Medication Sig Dispense Refill  . albuterol (PROVENTIL HFA;VENTOLIN HFA) 108 (90 BASE) MCG/ACT inhaler Inhale 2 puffs into the lungs every 6 (six) hours as needed for wheezing. (Patient not taking: Reported on 12/02/2019)    . albuterol (PROVENTIL) (2.5 MG/3ML) 0.083% nebulizer solution Take 3 mLs (2.5 mg total) by nebulization every 6 (six) hours as needed for wheezing or shortness of breath. (Patient not taking: Reported on 12/02/2019) 75 mL  2  . amLODipine (NORVASC) 5 MG tablet Take 5 mg by mouth daily.    . benzonatate (TESSALON) 100 MG capsule Take 1 capsule (100 mg total) by mouth 3 (three) times daily as needed for cough. (Patient not taking: Reported on 12/02/2019) 30 capsule 0  . Colchicine (MITIGARE) 0.6 MG CAPS Take 0.6 mg by mouth daily as needed. 15 capsule 3  . enoxaparin (LOVENOX) 150 MG/ML injection Inject 1 mL (150 mg total) into  the skin daily. 0 mL   . FLOVENT HFA 44 MCG/ACT inhaler Inhale 2 puffs into the lungs daily.     Marland Kitchen HYDROcodone-homatropine (HYCODAN) 5-1.5 MG/5ML syrup Take 5 mLs by mouth every 6 (six) hours as needed for cough. 120 mL 0  . hydrocortisone (ANUSOL-HC) 2.5 % rectal cream     . metoCLOPramide (REGLAN) 10 MG tablet Take 1 tablet (10 mg total) by mouth every 8 (eight) hours as needed for refractory nausea / vomiting. 30 tablet 0  . osimertinib mesylate (TAGRISSO) 40 MG tablet Take 1 tablet (40 mg total) by mouth daily. 30 tablet 2  . oxyCODONE-acetaminophen (PERCOCET/ROXICET) 5-325 MG tablet Take 1-2 tablets by mouth as needed.     . pantoprazole (PROTONIX) 40 MG tablet TAKE 1 TABLET BY MOUTH ONCE DAILY. 30 tablet 0  . potassium chloride (KLOR-CON) 10 MEQ tablet TAKE (2) TABLETS BY MOUTH TWICE DAILY. 120 tablet 0  . prochlorperazine (COMPAZINE) 10 MG tablet TAKE (1) TABLET BY MOUTH EVERY SIX HOURS AS NEEDED FOR NAUSEA OR VOMITING. (Patient not taking: Reported on 12/02/2019) 30 tablet 10  . zolpidem (AMBIEN CR) 6.25 MG CR tablet Take 1 tablet (6.25 mg total) by mouth at bedtime as needed for sleep. (Patient not taking: Reported on 12/02/2019) 30 tablet 0   No current facility-administered medications for this encounter.    Physical Findings: Unable to assess due to to telephone format used for this visit.  Lab Findings: Lab Results  Component Value Date   WBC 3.7 (L) 11/24/2019   HGB 15.4 (H) 11/24/2019   HCT 48.5 (H) 11/24/2019   MCV 88.5 11/24/2019   PLT 258 11/24/2019     Radiographic Findings: CT Chest W Contrast  Result Date: 11/25/2019 CLINICAL DATA:  Metastatic left lung cancer, status post radiation therapy and chemotherapy EXAM: CT CHEST WITH CONTRAST TECHNIQUE: Multidetector CT imaging of the chest was performed during intravenous contrast administration. CONTRAST:  75mL OMNIPAQUE IOHEXOL 300 MG/ML  SOLN COMPARISON:  07/03/2019, 03/20/2019 FINDINGS: Cardiovascular: No significant  vascular findings. Normal heart size. No pericardial effusion. Mediastinum/Nodes: No discretely enlarged mediastinal or hilar lymph nodes. Unchanged post treatment appearance of soft tissue thickening about the mediastinum and bilateral hila. Thyroid gland, trachea, and esophagus demonstrate no significant findings. Lungs/Pleura: Unchanged post treatment appearance of the lungs with bandlike perihilar consolidation and fibrosis of the bilateral upper lobes (series 4, image 56) as well as the superior segment right lower lobe (series 4, image 72) and the left lung base (series 4, image 99). Unchanged trace left pleural effusion. Upper Abdomen: No acute abnormality. Musculoskeletal: No chest wall mass or suspicious bone lesions identified. IMPRESSION: 1. Unchanged post treatment appearance of the lungs with bandlike perihilar consolidation and fibrosis of the bilateral upper lobes as well as the superior segment right lower lobe and the left lung base. 2. Unchanged post treatment appearance of mediastinal and bilateral hilar soft tissue thickening without discretely enlarged lymph nodes. 3. No evidence of recurrent or new metastatic disease in the chest. 4. Unchanged trace left  pleural effusion. Electronically Signed   By: Lauralyn Primes M.D.   On: 11/25/2019 12:37   MR Brain W Wo Contrast  Result Date: 12/21/2019 CLINICAL DATA:  Follow-up treated metastatic disease. Metastatic lung cancer with whole brain radiation. EXAM: MRI HEAD WITHOUT AND WITH CONTRAST TECHNIQUE: Multiplanar, multiecho pulse sequences of the brain and surrounding structures were obtained without and with intravenous contrast. CONTRAST:  9mL GADAVIST GADOBUTROL 1 MMOL/ML IV SOLN COMPARISON:  09/12/2019.  06/02/2019.  02/19/2019. FINDINGS: Brain: Generalized atrophy as seen previously. Mild chronic small-vessel ischemic change of the cerebral hemispheric white matter as seen previously. Diffusion imaging does not show any acute or subacute  infarction or other cause of restricted diffusion. Minimal punctate focus of enhancement in the left temporal lobe is barely visible, less conspicuous than on the previous study. This is at the site of a previous treated lesion in does not show any evidence of progressive change. Elsewhere, there are no foci of abnormal enhancement. No hydrocephalus or extra-axial fluid. No skull or skull base lesion. 8 mm left frontal meningioma unchanged. 4 mm right frontoparietal vertex meningioma unchanged. Vascular: Major vessels at the base of the brain show flow. Skull and upper cervical spine: Negative Sinuses/Orbits: Clear/normal. Previous functional endoscopic sinus surgery. Other: None IMPRESSION: No evidence of new or progressive disease. Minimal punctate enhancement remains visible at the site of a previously treated left temporal lesion. This does not suggest residual or recurrent disease. No other enhancing foci. 4 mm meningioma at the right frontoparietal vertex and 8 mm meningioma at the left anterior cranial fossa convexity are unchanged without mass-effect upon the brain. Electronically Signed   By: Paulina Fusi M.D.   On: 12/21/2019 14:03    Impression/Plan:  This visit was conducted via telephone to spare the patient unnecessary potential exposure in the healthcare setting during the current COVID-19 pandemic.  1. 69 y.o. female with metastatic NSCLC of the left lower lobe lung with significant mediastinal/hilar lymph node involvement andat least20 subcentimeter brain metastases. She has recovered well from the effects of her radiotherapy and remains both clinically and radiographically stable. We reviewed her most recent brain MRI results from 12/21/19 which shows an excellent response to whole-brain XRT, with stable post treatment appearance of the brain without evidence of new or progressive metastatic disease and an unchanged appearance of the subcentimeter meningiomas overlying the bilateral frontal  lobes. We discussed the recommendation to continue with serial brain MRI scans every 3 months to monitor for any signs of disease recurrence or progression.  She prefers to continue with telephone follow up visits following each scan to review findings and recommndations. She will also continue in routine follow up with Dr. Ellin [name] for management of her systemic disease treatment.  The current plan is to continue on targeted systemic therapy with Osimertinib at a reduced dose of 40 mg daily.  She appears to have a good understanding of our recommendations and is in agreement with the stated plan. She knows to call at any time in the interim with questions or concerns related to her previous radiation.  2. Dizziness/vertigo. I have recommended that she call her PCP, Dr. Ouida [name] to discuss her symptoms of dizziness/imbalance. Reassurance that there is no indication of active disease in the brain based on this recent scan so it is likely this is a recurrence of the vertigo that she has had in the past. We will follow expectantly.  Given current concerns for patient exposure during the COVID-19 pandemic, this encounter was  conducted via telephone. The patient was notified in advance and was offered a WebEX meeting to allow for face to face communication but unfortunately reported that she did not have the appropriate resources/technology to support such a visit and instead preferred to proceed with telephone consult. The patient has given verbal consent for this type of encounter. The time spent during this encounter was 15 minutes and 50% of that time was spent in the coordination of her care. The attendants for this meeting include Tykeria Wawrzyniak PA-C, and patient, Delaynee Edquist. [place]. During the encounter, Kataya Guimont PA-C was located at [facility] Radiation Oncology Department.  Patient, [place]. [place] was located at home.    Marguarite Arbour, PA-C

## 2019-12-28 ENCOUNTER — Inpatient Hospital Stay: Payer: Medicare HMO | Attending: Radiation Oncology

## 2020-01-13 DIAGNOSIS — H401133 Primary open-angle glaucoma, bilateral, severe stage: Secondary | ICD-10-CM | POA: Diagnosis not present

## 2020-01-13 DIAGNOSIS — H0262 Xanthelasma of right lower eyelid: Secondary | ICD-10-CM | POA: Diagnosis not present

## 2020-01-13 DIAGNOSIS — E119 Type 2 diabetes mellitus without complications: Secondary | ICD-10-CM | POA: Diagnosis not present

## 2020-01-18 DIAGNOSIS — C3490 Malignant neoplasm of unspecified part of unspecified bronchus or lung: Secondary | ICD-10-CM | POA: Diagnosis not present

## 2020-01-18 DIAGNOSIS — I2690 Septic pulmonary embolism without acute cor pulmonale: Secondary | ICD-10-CM | POA: Diagnosis not present

## 2020-01-21 ENCOUNTER — Other Ambulatory Visit (HOSPITAL_COMMUNITY): Payer: Self-pay

## 2020-01-21 MED ORDER — OSIMERTINIB MESYLATE 40 MG PO TABS: 40.0000 mg | ORAL_TABLET | Freq: Every day | ORAL | 2 refills | Status: DC

## 2020-02-02 ENCOUNTER — Other Ambulatory Visit (HOSPITAL_COMMUNITY): Payer: Self-pay

## 2020-02-02 MED ORDER — OSIMERTINIB MESYLATE 40 MG PO TABS: 40.0000 mg | ORAL_TABLET | Freq: Every day | ORAL | 2 refills | Status: DC

## 2020-02-05 ENCOUNTER — Other Ambulatory Visit (HOSPITAL_COMMUNITY): Payer: Self-pay

## 2020-02-05 ENCOUNTER — Other Ambulatory Visit (HOSPITAL_COMMUNITY): Payer: Self-pay | Admitting: *Deleted

## 2020-02-05 MED ORDER — OSIMERTINIB MESYLATE 40 MG PO TABS: 40.0000 mg | ORAL_TABLET | Freq: Every day | ORAL | 2 refills | Status: DC

## 2020-02-08 ENCOUNTER — Other Ambulatory Visit (HOSPITAL_COMMUNITY): Payer: Self-pay

## 2020-03-01 ENCOUNTER — Inpatient Hospital Stay (HOSPITAL_COMMUNITY): Payer: Medicare HMO | Attending: Hematology

## 2020-03-01 ENCOUNTER — Other Ambulatory Visit: Payer: Self-pay

## 2020-03-01 DIAGNOSIS — Z79899 Other long term (current) drug therapy: Secondary | ICD-10-CM | POA: Diagnosis not present

## 2020-03-01 DIAGNOSIS — R053 Chronic cough: Secondary | ICD-10-CM | POA: Insufficient documentation

## 2020-03-01 DIAGNOSIS — C7931 Secondary malignant neoplasm of brain: Secondary | ICD-10-CM | POA: Insufficient documentation

## 2020-03-01 DIAGNOSIS — R634 Abnormal weight loss: Secondary | ICD-10-CM | POA: Insufficient documentation

## 2020-03-01 DIAGNOSIS — I2699 Other pulmonary embolism without acute cor pulmonale: Secondary | ICD-10-CM | POA: Diagnosis not present

## 2020-03-01 DIAGNOSIS — J45909 Unspecified asthma, uncomplicated: Secondary | ICD-10-CM | POA: Diagnosis not present

## 2020-03-01 DIAGNOSIS — Z86711 Personal history of pulmonary embolism: Secondary | ICD-10-CM | POA: Insufficient documentation

## 2020-03-01 DIAGNOSIS — C3492 Malignant neoplasm of unspecified part of left bronchus or lung: Secondary | ICD-10-CM | POA: Diagnosis not present

## 2020-03-01 DIAGNOSIS — R630 Anorexia: Secondary | ICD-10-CM | POA: Diagnosis not present

## 2020-03-01 DIAGNOSIS — Z806 Family history of leukemia: Secondary | ICD-10-CM | POA: Diagnosis not present

## 2020-03-01 DIAGNOSIS — Z90710 Acquired absence of both cervix and uterus: Secondary | ICD-10-CM | POA: Insufficient documentation

## 2020-03-01 DIAGNOSIS — Z931 Gastrostomy status: Secondary | ICD-10-CM | POA: Insufficient documentation

## 2020-03-01 LAB — CBC WITH DIFFERENTIAL/PLATELET
Abs Immature Granulocytes: 0.01 10*3/uL (ref 0.00–0.07)
Basophils Absolute: 0.1 10*3/uL (ref 0.0–0.1)
Basophils Relative: 1 %
Eosinophils Absolute: 0.5 10*3/uL (ref 0.0–0.5)
Eosinophils Relative: 10 %
HCT: 45.4 % (ref 36.0–46.0)
Hemoglobin: 14.6 g/dL (ref 12.0–15.0)
Immature Granulocytes: 0 %
Lymphocytes Relative: 23 %
Lymphs Abs: 1.1 10*3/uL (ref 0.7–4.0)
MCH: 28.1 pg (ref 26.0–34.0)
MCHC: 32.2 g/dL (ref 30.0–36.0)
MCV: 87.5 fL (ref 80.0–100.0)
Monocytes Absolute: 0.6 10*3/uL (ref 0.1–1.0)
Monocytes Relative: 13 %
Neutro Abs: 2.5 10*3/uL (ref 1.7–7.7)
Neutrophils Relative %: 53 %
Platelets: 208 10*3/uL (ref 150–400)
RBC: 5.19 MIL/uL — ABNORMAL HIGH (ref 3.87–5.11)
RDW: 13.5 % (ref 11.5–15.5)
WBC: 4.7 10*3/uL (ref 4.0–10.5)
nRBC: 0 % (ref 0.0–0.2)

## 2020-03-01 LAB — COMPREHENSIVE METABOLIC PANEL
ALT: 25 U/L (ref 0–44)
AST: 22 U/L (ref 15–41)
Albumin: 4.4 g/dL (ref 3.5–5.0)
Alkaline Phosphatase: 77 U/L (ref 38–126)
Anion gap: 10 (ref 5–15)
BUN: 14 mg/dL (ref 8–23)
CO2: 26 mmol/L (ref 22–32)
Calcium: 9.9 mg/dL (ref 8.9–10.3)
Chloride: 101 mmol/L (ref 98–111)
Creatinine, Ser: 1.28 mg/dL — ABNORMAL HIGH (ref 0.44–1.00)
GFR calc non Af Amer: 43 mL/min — ABNORMAL LOW (ref >60)
Glucose, Bld: 73 mg/dL (ref 70–99)
Potassium: 4.1 mmol/L (ref 3.5–5.1)
Sodium: 137 mmol/L (ref 135–145)
Total Bilirubin: 0.6 mg/dL (ref 0.3–1.2)
Total Protein: 7.4 g/dL (ref 6.5–8.1)

## 2020-03-03 ENCOUNTER — Other Ambulatory Visit: Payer: Self-pay | Admitting: Radiation Therapy

## 2020-03-03 DIAGNOSIS — C7931 Secondary malignant neoplasm of brain: Secondary | ICD-10-CM

## 2020-03-08 ENCOUNTER — Inpatient Hospital Stay (HOSPITAL_COMMUNITY): Payer: Medicare HMO | Admitting: Hematology

## 2020-03-08 ENCOUNTER — Other Ambulatory Visit: Payer: Self-pay

## 2020-03-08 ENCOUNTER — Encounter (HOSPITAL_COMMUNITY): Payer: Self-pay | Admitting: Hematology

## 2020-03-08 VITALS — BP 141/87 | HR 102 | Temp 97.0°F | Resp 18 | Wt 207.2 lb

## 2020-03-08 DIAGNOSIS — I2699 Other pulmonary embolism without acute cor pulmonale: Secondary | ICD-10-CM | POA: Diagnosis not present

## 2020-03-08 DIAGNOSIS — R630 Anorexia: Secondary | ICD-10-CM | POA: Diagnosis not present

## 2020-03-08 DIAGNOSIS — C3492 Malignant neoplasm of unspecified part of left bronchus or lung: Secondary | ICD-10-CM | POA: Diagnosis not present

## 2020-03-08 DIAGNOSIS — R634 Abnormal weight loss: Secondary | ICD-10-CM | POA: Diagnosis not present

## 2020-03-08 DIAGNOSIS — Z23 Encounter for immunization: Secondary | ICD-10-CM | POA: Diagnosis not present

## 2020-03-08 DIAGNOSIS — Z931 Gastrostomy status: Secondary | ICD-10-CM | POA: Diagnosis not present

## 2020-03-08 DIAGNOSIS — J45909 Unspecified asthma, uncomplicated: Secondary | ICD-10-CM | POA: Diagnosis not present

## 2020-03-08 DIAGNOSIS — R053 Chronic cough: Secondary | ICD-10-CM | POA: Diagnosis not present

## 2020-03-08 DIAGNOSIS — C7931 Secondary malignant neoplasm of brain: Secondary | ICD-10-CM | POA: Diagnosis not present

## 2020-03-08 DIAGNOSIS — Z86711 Personal history of pulmonary embolism: Secondary | ICD-10-CM | POA: Diagnosis not present

## 2020-03-08 NOTE — Patient Instructions (Addendum)
[place] at [facility] Discharge Instructions  You were seen today by Dr. Delton Coombes. He went over your recent results. You will be scheduled for a CT scan of your chest and abdomen before your next visit. Dr. Delton Coombes will see you back in 6 weeks for labs and follow up.   Thank you for choosing [place] at [facility] to provide your oncology and hematology care.  To afford each patient quality time with our provider, please arrive at least 15 minutes before your scheduled appointment time.   If you have a lab appointment with the [facility] please come in thru the Main Entrance and check in at the main information desk  You need to re-schedule your appointment should you arrive 10 or more minutes late.  We strive to give you quality time with our providers, and arriving late affects you and other patients whose appointments are after yours.  Also, if you no show three or more times for appointments you may be dismissed from the clinic at the providers discretion.     Again, thank you for choosing [facility].  Our hope is that these requests will decrease the amount of time that you wait before being seen by our physicians.       _____________________________________________________________  Should you have questions after your visit to [facility], please contact our office at [phone] between the hours of 8:00 a.m. and 4:30 p.m.  Voicemails left after 4:00 p.m. will not be returned until the following business day.  For prescription refill requests, have your pharmacy contact our office and allow 72 hours.    Cancer Center Support Programs:   > Cancer Support Group  2nd Tuesday of the month 1pm-2pm, Journey Room

## 2020-03-08 NOTE — Progress Notes (Signed)
Kaitlin [name], Kaitlin [name]   CLINIC:  Medical Oncology/Hematology  PCP:  [name], MD [address] / [place] [address] [phone]   REASON FOR VISIT:  Follow-up for metastatic lung cancer  PRIOR THERAPY: None  NGS Results: Guardant 360 EGFR exon 19 deletion  CURRENT THERAPY: Osimertinib daily  BRIEF ONCOLOGIC HISTORY:  Oncology History   No history exists.    CANCER STAGING: Cancer Staging No matching staging information was found for the patient.  INTERVAL HISTORY:  Kaitlin [name], a 69 y.o. female, returns for routine follow-up of her metastatic lung cancer. Kaitlin [name] was last seen on 12/02/2019.  Today she reports feeling well. She is tolerating the osimertinib well and denies N/V/D or abdominal pain. She reports having occasional lightheadedness, but was prescribed meclizine for it. She has occasional mild headaches, but denies vision changes. She also reports having rashes on her right arm and back of neck, for which she used Kenalog. Her appetite is excellent.   REVIEW OF SYSTEMS:  Review of Systems  Constitutional: Positive for fatigue (50%). Negative for appetite change.  Eyes: Negative for eye problems.  Respiratory: Positive for cough (dry) and shortness of breath.   Gastrointestinal: Negative for abdominal pain, diarrhea, nausea and vomiting.  Skin: Positive for itching (R arm and neck) and rash (R arm and neck).  Neurological: Positive for headaches (mild, occasional) and light-headedness.  All other systems reviewed and are negative.   PAST MEDICAL/SURGICAL HISTORY:  Past Medical History:  Diagnosis Date  . Asthma   . Cancer ([place]) 04/21/2018   Lung Cancer   . Carpal tunnel syndrome   . Cataract    left eye  . Diabetes mellitus without complication ([place])   . DVT (deep venous thrombosis) ([place])   . Hypertension   . Pneumonia 04/21/2018  . Pulmonary embolism ([place]) 03/2018  . Sepsis [facility])     Past Surgical History:  Procedure Laterality Date  . ABDOMINAL HYSTERECTOMY    . biopsy of right breast    . ESOPHAGOGASTRODUODENOSCOPY (EGD) WITH PROPOFOL N/A 09/12/2018   Procedure: ESOPHAGOGASTRODUODENOSCOPY (EGD) WITH PROPOFOL (scope in 1017, scope out 1032);  Surgeon: Virl Cagey, MD;  Location: [facility];  Service: General;  Laterality: N/A;  . FOOT SURGERY    . implant left eye    . INTRAOCULAR LENS INSERTION    . IR GASTROSTOMY TUBE MOD SED  12/05/2018  . PEG PLACEMENT N/A 09/12/2018   Procedure: PERCUTANEOUS ENDOSCOPIC GASTROSTOMY (PEG) PLACEMENT;  Surgeon: Virl Cagey, MD;  Location: [facility];  Service: General;  Laterality: N/A;    SOCIAL HISTORY:  Social History   Socioeconomic History  . Marital status: Widowed    Spouse name: Not on file  . Number of children: 4  . Years of education: 27  . Highest education level: High school graduate  Occupational History  . Not on file  Tobacco Use  . Smoking status: Never Smoker  . Smokeless tobacco: Never Used  Vaping Use  . Vaping Use: Never used  Substance and Sexual Activity  . Alcohol use: Not Currently    Comment: occ  . Drug use: No  . Sexual activity: Yes    Partners: Male    Birth control/protection: Surgical  Other Topics Concern  . 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   Social Determinants of Health   Financial Resource Strain:   . Difficulty of Paying Living Expenses: Not on file  Food Insecurity:   . Worried About Charity fundraiser in the Last Year: Not on file  . Ran Out of Food in the Last Year: Not on file  Transportation Needs:   . Lack of Transportation (Medical): Not on file  . Lack of Transportation (Non-Medical): Not on file  Physical Activity:   . Days of Exercise per  Week: Not on file  . Minutes of Exercise per Session: Not on file  Stress:   . Feeling of Stress : Not on file  Social Connections:   . Frequency of Communication with Friends and Family: Not on file  . Frequency of Social Gatherings with Friends and Family: Not on file  . Attends Religious Services: Not on file  . Active Member of Clubs or Organizations: Not on file  . Attends Archivist Meetings: Not on file  . Marital Status: Not on file  Intimate Partner Violence:   . Fear of Current or Ex-Partner: Not on file  . Emotionally Abused: Not on file  . Physically Abused: Not on file  . Sexually Abused: Not on file    FAMILY HISTORY:  Family History  Problem Relation Age of Onset  . Heart failure Mother   . Stroke Mother   . Heart attack Mother   . Kidney failure Other   . Leukemia Father     CURRENT MEDICATIONS:  Current Outpatient Medications  Medication Sig Dispense Refill  . albuterol (PROVENTIL HFA;VENTOLIN HFA) 108 (90 BASE) MCG/ACT inhaler Inhale 2 puffs into the lungs every 6 (six) hours as needed for wheezing.     Marland Kitchen albuterol (PROVENTIL) (2.5 MG/3ML) 0.083% nebulizer solution Take 3 mLs (2.5 mg total) by nebulization every 6 (six) hours as needed for wheezing or shortness of breath. 75 mL 2  . amLODipine (NORVASC) 5 MG tablet Take 5 mg by mouth daily.    . benzonatate (TESSALON) 100 MG capsule Take 1 capsule (100 mg total) by mouth 3 (three) times daily as needed for cough. (Patient not taking: Reported on 12/02/2019) 30 capsule 0  . Colchicine (MITIGARE) 0.6 MG CAPS Take 0.6 mg by mouth daily as needed. 15 capsule 3  . enoxaparin (LOVENOX) 150 MG/ML injection Inject 1 mL (150 mg total) into the skin daily. 0 mL   . FLOVENT HFA 44 MCG/ACT inhaler Inhale 2 puffs into the lungs daily.     Marland Kitchen HYDROcodone-homatropine (HYCODAN) 5-1.5 MG/5ML syrup Take 5 mLs by mouth every 6 (six) hours as needed for cough. 120 mL 0  . hydrocortisone (ANUSOL-HC) 2.5 % rectal cream       . latanoprost (XALATAN) 0.005 % ophthalmic solution Place 1 drop into both eyes in the morning and at bedtime.     . meclizine (ANTIVERT) 25 MG tablet Take 25 mg by mouth as needed.     . metoCLOPramide (REGLAN) 10 MG tablet Take 1 tablet (10 mg total) by mouth every 8 (eight) hours as needed for refractory nausea / vomiting. 30 tablet 0  . osimertinib mesylate (TAGRISSO) 40 MG tablet Take 1 tablet (40 mg total) by mouth daily. 30 tablet 2  . oxyCODONE-acetaminophen (PERCOCET/ROXICET) 5-325 MG tablet Take 1-2 tablets by mouth as needed.     . pantoprazole (PROTONIX) 40 MG  tablet TAKE 1 TABLET BY MOUTH ONCE DAILY. 30 tablet 0  . potassium chloride (KLOR-CON) 10 MEQ tablet TAKE (2) TABLETS BY MOUTH TWICE DAILY. 120 tablet 0  . prochlorperazine (COMPAZINE) 10 MG tablet TAKE (1) TABLET BY MOUTH EVERY SIX HOURS AS NEEDED FOR NAUSEA OR VOMITING. (Patient not taking: Reported on 12/02/2019) 30 tablet 10  . triamcinolone cream (KENALOG) 0.1 % Apply 1 application topically as needed.     . zolpidem (AMBIEN CR) 6.25 MG CR tablet Take 1 tablet (6.25 mg total) by mouth at bedtime as needed for sleep. 30 tablet 0   No current facility-administered medications for this visit.    ALLERGIES:  Allergies  Allergen Reactions  . Banana Anaphylaxis  . Aspirin Other (See Comments)    G.I. Upset     PHYSICAL EXAM:  Performance status (ECOG): 1 - Symptomatic but completely ambulatory  Vitals:   03/08/20 1428  BP: (!) 141/87  Pulse: (!) 102  Resp: 18  Temp: (!) 97 F (36.1 C)  SpO2: 99%   Wt Readings from Last 3 Encounters:  03/08/20 207 lb 3.2 oz (94 kg)  12/02/19 195 lb 6.4 oz (88.6 kg)  09/08/19 190 lb (86.2 kg)   Physical Exam Vitals reviewed.  Constitutional:      Appearance: Normal appearance. She is obese.  Cardiovascular:     Rate and Rhythm: Normal rate and regular rhythm.     Pulses: Normal pulses.     Heart sounds: Normal heart sounds.  Pulmonary:     Effort: Pulmonary effort is  normal.     Breath sounds: Normal breath sounds.  Skin:    Findings: No rash.  Neurological:     General: No focal deficit present.     Mental Status: She is alert and oriented to person, place, and time.  Psychiatric:        Mood and Affect: Mood normal.        Behavior: Behavior normal.      LABORATORY DATA:  I have reviewed the labs as listed.  CBC Latest Ref Rng & Units 03/01/2020 11/24/2019 09/08/2019  WBC 4.0 - 10.5 K/uL 4.7 3.7(L) 3.7(L)  Hemoglobin 12.0 - 15.0 g/dL 14.6 15.4(H) 14.3  Hematocrit 36 - 46 % 45.4 48.5(H) 46.2(H)  Platelets 150 - 400 K/uL 208 258 181   CMP Latest Ref Rng & Units 03/01/2020 11/24/2019 09/08/2019  Glucose 70 - 99 mg/dL 73 86 91  BUN 8 - 23 mg/dL _0 Creatinine 0.44 - 1.00 mg/dL 1.28(H) 0.98 1.03(H)  Sodium 135 - 145 mmol/L 137 140 138  Potassium 3.5 - 5.1 mmol/L 4.1 4.0 3.9  Chloride 98 - 111 mmol/L 101 103 103  CO2 22 - 32 mmol/L _1 Calcium 8.9 - 10.3 mg/dL 9.9 9.7 9.4  Total Protein 6.5 - 8.1 g/dL 7.4 7.6 7.1  Total Bilirubin 0.3 - 1.2 mg/dL 0.6 0.5 0.7  Alkaline Phos 38 - 126 U/L 77 79 74  AST 15 - 41 U/L _2 ALT 0 - 44 U/L _3 DIAGNOSTIC IMAGING:  I have independently reviewed the scans and discussed with the patient. No results found.   ASSESSMENT:  1. Metastatic adenocarcinoma of the left lung with multiple brain mets: -Guardant 360 with EGFR exon 19 deletion.  Patient never smoker. -Osimertinib 80 mg daily started on 07/28/2018, dose reduced due to weight loss and decreased appetite in July 2020. -CT chest on 11/25/2019 shows stable disease  with no evidence of recurrence or metastasis.  2.  Brain metastasis: -20 subcentimeter brain lesions and diagnosis. -Whole brain RT completed on 07/29/2018. -MRI of the brain with and without contrast on 09/12/2019 showed increased conspicuity of a 3 mm focus of enhancement at site of treated anterior left temporal lobe lesion.  Increased conspicuity is related to improved  imaging quality on the present exam.  No new intracranial metastasis. -MRI of the brain with and without contrast on 12/21/2019 with no evidence of new or progressive disease.  Minimal punctate enhancement remains visible at the site of previously treated left temporal lesion.  4 mm meningioma at the right frontoparietal vertex and 8 mm meningioma at the left anterior cranial fossa convexity are unchanged without mass-effect upon the brain.   PLAN:  1. Metastatic adenocarcinoma of the left lung with multiple brain mets: -She is continuing to tolerate osimertinib 40 mg daily very well. -Reviewed labs which showed normal LFTs and CBC. -Creatinine went up slightly at 1.28.  Encouraged adequate fluid intake. -RTC 6 weeks with repeat CT scan of the chest.  2.  Brain metastasis: -We reviewed results of brain MRI from 12/21/2019. -She has follow-up MRI scheduled end of October.  3.  Recurrent PE: -Continue Lovenox indefinitely.  4.  Chronic cough: -Continue Hycodan as needed.   Orders placed this encounter:  No orders of the defined types were placed in this encounter.    Derek Jack, MD [place] [phone]   I, Milinda Antis, am acting as a scribe for Dr. Sanda Linger.  I, Derek Jack MD, have reviewed the above documentation for accuracy and completeness, and I agree with the above.

## 2020-03-24 ENCOUNTER — Other Ambulatory Visit: Payer: Self-pay

## 2020-03-24 ENCOUNTER — Ambulatory Visit (HOSPITAL_COMMUNITY)
Admission: RE | Admit: 2020-03-24 | Discharge: 2020-03-24 | Disposition: A | Discharge status: Home / Self Care | Payer: Medicare HMO | Source: Ambulatory Visit | Attending: Radiation Oncology | Admitting: Radiation Oncology

## 2020-03-24 DIAGNOSIS — D329 Benign neoplasm of meninges, unspecified: Secondary | ICD-10-CM | POA: Diagnosis not present

## 2020-03-24 DIAGNOSIS — G319 Degenerative disease of nervous system, unspecified: Secondary | ICD-10-CM | POA: Diagnosis not present

## 2020-03-24 DIAGNOSIS — C7931 Secondary malignant neoplasm of brain: Secondary | ICD-10-CM | POA: Diagnosis not present

## 2020-03-24 DIAGNOSIS — J3489 Other specified disorders of nose and nasal sinuses: Secondary | ICD-10-CM | POA: Diagnosis not present

## 2020-03-24 DIAGNOSIS — C7949 Secondary malignant neoplasm of other parts of nervous system: Secondary | ICD-10-CM | POA: Insufficient documentation

## 2020-03-24 DIAGNOSIS — I6782 Cerebral ischemia: Secondary | ICD-10-CM | POA: Diagnosis not present

## 2020-03-24 MED ORDER — GADOBUTROL 1 MMOL/ML IV SOLN
8.0000 mL | Freq: Once | INTRAVENOUS | Status: AC | PRN
  Administered 2020-03-24: 8 mL via INTRAVENOUS

## 2020-03-28 ENCOUNTER — Encounter: Payer: Self-pay | Admitting: Urology

## 2020-03-28 ENCOUNTER — Inpatient Hospital Stay: Payer: Medicare HMO | Attending: Radiation Oncology

## 2020-03-28 ENCOUNTER — Other Ambulatory Visit: Payer: Self-pay

## 2020-03-28 NOTE — Progress Notes (Signed)
Spoke to patient via phone she denies any complaints of.

## 2020-03-30 ENCOUNTER — Ambulatory Visit
Admission: RE | Admit: 2020-03-30 | Discharge: 2020-03-30 | Disposition: A | Discharge status: Home / Self Care | Payer: Medicare HMO | Source: Ambulatory Visit | Attending: Urology | Admitting: Urology

## 2020-03-30 ENCOUNTER — Other Ambulatory Visit: Payer: Self-pay

## 2020-03-30 DIAGNOSIS — C7931 Secondary malignant neoplasm of brain: Secondary | ICD-10-CM | POA: Diagnosis not present

## 2020-03-30 DIAGNOSIS — C3432 Malignant neoplasm of lower lobe, left bronchus or lung: Secondary | ICD-10-CM | POA: Diagnosis not present

## 2020-03-30 DIAGNOSIS — Z08 Encounter for follow-up examination after completed treatment for malignant neoplasm: Secondary | ICD-10-CM | POA: Diagnosis not present

## 2020-03-30 DIAGNOSIS — R42 Dizziness and giddiness: Secondary | ICD-10-CM | POA: Diagnosis not present

## 2020-03-30 NOTE — Progress Notes (Signed)
Radiation Oncology         [phone] ________________________________  Name: Kaitlin [name] MRN: [id number]  Date: 03/30/2020  DOB: 27-Dec-1950  Post Treatment Note  CC: [name], MD  [name], MD  Diagnosis:   69 y.o. female with metastatic NSCLC of the left lower lobe lung with significant mediastinal/hilar lymph node involvement andat least20 subcentimeter brain metastases.  Interval Since Last Radiation:  1 year, 8 months 07/16/2018 - 07/29/2018:   1. The left lung was treated to 30 Gy in 10 fractions of 3 Gy. 2. The whole brain was treated to 30 Gy in 10 fractions of 3 Gy.  Narrative:  I spoke with the patient to conduct her routine scheduled 3 month follow up visit to review results of her recent follow up MRI brain scan via telephone to spare the patient unnecessary potential exposure in the healthcare setting during the current COVID-19 pandemic.  She has recovered well from the effects of her radiotherapy and remains without complaints.  Her brain MRI from 03/24/20 shows an overall stable post treatment appearance of the brain without definite evidence of new or progressive metastatic disease and an unchanged appearance of the subcentimeter meningiomas overlying the bilateral frontal lobes.  This scan was reviewed in the multidisciplinary brain conference and the consensus recommendation is to continue with routine 3 month imaging with close attention to the 2 mm focus of enhancement along the posterior paramidline left parietal cortex.                 On review of systems, the patient states that she is doing well overall.  She has continued with an excellent appetite on the reduced dose of Osimertinib (Tag[place]so) and she continues to tolerate systemic treatment well.  She denies abdominal pain, N/V/D and denies any dysphagia or throat irritation.  She denies chest pain, productive cough, hemoptysis or increased shortness of breath. She continues to gradually regain her energy  and strength and denies focal weakness, tremor, changes in visual or auditory acuity, tinnitus or seizure activity.  She has had some occasional mild headaches that are not frequent and respond to OTC medications. She is also taking Meclazine for dizziness/imbalance associated with vertigo with similar symptoms as what she has had in the past with prior episodes of vertigo managed by her PCP, Dr. Ouida Sills. She is currently without complaints of dizziness/imbalance. She has continued on targeted systemic therapy with Osimertinib at a reduced dose to 40 mg under the care and direction of Dr. Ellin Saba.  Her most recent systemic imaging from 11/25/2019 showed stable postradiation changes in the medial aspects of both hemithoraces and left lower lobe without evidence of recurrent or new metastatic disease in the chest. She had a recent follow up visit with Dr. Ellin Saba on 03/08/2020 and she is scheduled for repeat systemic imaging with CT of the chest/abdomen and pelvis on 04/18/2020 with a follow up visit thereafter to review results.  ALLERGIES:  is allergic to banana and aspirin.  Meds: Current Outpatient Medications  Medication Sig Dispense Refill  . albuterol (PROVENTIL HFA;VENTOLIN HFA) 108 (90 BASE) MCG/ACT inhaler Inhale 2 puffs into the lungs every 6 (six) hours as needed for wheezing.     Marland Kitchen albuterol (PROVENTIL) (2.5 MG/3ML) 0.083% nebulizer solution Take 3 mLs (2.5 mg total) by nebulization every 6 (six) hours as needed for wheezing or shortness of breath. 75 mL 2  . amLODipine (NORVASC) 5 MG tablet Take 5 mg by mouth daily.    Marland Kitchen  benzonatate (TESSALON) 100 MG capsule Take 1 capsule (100 mg total) by mouth 3 (three) times daily as needed for cough. 30 capsule 0  . Colchicine (MITIGARE) 0.6 MG CAPS Take 0.6 mg by mouth daily as needed. 15 capsule 3  . enoxaparin (LOVENOX) 150 MG/ML injection Inject 1 mL (150 mg total) into the skin daily. 0 mL   . FLOVENT HFA 44 MCG/ACT inhaler Inhale 2 puffs into  the lungs daily.     Marland Kitchen HYDROcodone-homatropine (HYCODAN) 5-1.5 MG/5ML syrup Take 5 mLs by mouth every 6 (six) hours as needed for cough. 120 mL 0  . hydrocortisone (ANUSOL-HC) 2.5 % rectal cream     . latanoprost (XALATAN) 0.005 % ophthalmic solution Place 1 drop into both eyes in the morning and at bedtime.     . meclizine (ANTIVERT) 25 MG tablet Take 25 mg by mouth as needed.     . metoCLOPramide (REGLAN) 10 MG tablet Take 1 tablet (10 mg total) by mouth every 8 (eight) hours as needed for refractory nausea / vomiting. 30 tablet 0  . osimertinib mesylate (TAGRISSO) 40 MG tablet Take 1 tablet (40 mg total) by mouth daily. 30 tablet 2  . oxyCODONE-acetaminophen (PERCOCET/ROXICET) 5-325 MG tablet Take 1-2 tablets by mouth as needed.     . pantoprazole (PROTONIX) 40 MG tablet TAKE 1 TABLET BY MOUTH ONCE DAILY. 30 tablet 0  . potassium chloride (KLOR-CON) 10 MEQ tablet TAKE (2) TABLETS BY MOUTH TWICE DAILY. 120 tablet 0  . prochlorperazine (COMPAZINE) 10 MG tablet TAKE (1) TABLET BY MOUTH EVERY SIX HOURS AS NEEDED FOR NAUSEA OR VOMITING. 30 tablet 10  . triamcinolone cream (KENALOG) 0.1 % Apply 1 application topically as needed.     . zolpidem (AMBIEN CR) 6.25 MG CR tablet Take 1 tablet (6.25 mg total) by mouth at bedtime as needed for sleep. 30 tablet 0   No current facility-administered medications for this encounter.    Physical Findings: Unable to assess due to to telephone format used for this visit.  Lab Findings: Lab Results  Component Value Date   WBC 4.7 03/01/2020   HGB 14.6 03/01/2020   HCT 45.4 03/01/2020   MCV 87.5 03/01/2020   PLT 208 03/01/2020     Radiographic Findings: MR Brain W Wo Contrast  Result Date: 03/24/2020 CLINICAL DATA:  Routine follow-up of metastatic lung cancer. EXAM: MRI HEAD WITHOUT AND WITH CONTRAST TECHNIQUE: Multiplanar, multiecho pulse sequences of the brain and surrounding structures were obtained without and with intravenous contrast. CONTRAST:   8mL GADAVIST GADOBUTROL 1 MMOL/ML IV SOLN COMPARISON:  MRI 12/21/2019. FINDINGS: Brain: No acute infarction, hemorrhage, hydrocephalus, or extra-axial collection. Stable mild generalized atrophy and mild chronic microvascular ischemic disease. Partially empty sella. Slight increase in conspicuity/bulk of an approximately 2 mm focus of enhancement along the posterior paramidline left parietal cortex (see series 9, image 100, series 10, image 10, and series 11, image 20). Punctate foci of enhancement at the site of previously treated lesions within the left temporal lobe (series 9, image 69), left middle frontal gyrus (series 9, image 111), and left frontal operculum (series 9, image 91)Similar mild associated edema without mass effect. Similar size/appearance of approximately 8 mm left frontal meningioma and 4 mm right frontoparietal meningioma. Vascular: Major arterial flow voids are maintained at the skull base. Skull and upper cervical spine: Normal marrow signal. Sinuses/Orbits: Previous functional endoscopic surgery with scattered mucosal thickening. No air-fluid levels. Unremarkable orbits. Other: None. IMPRESSION: 1. Slight increase in conspicuity/bulk of an  approximately 2 mm focus of enhancement along the posterior paramidline left parietal cortex not well seen on the priors (see series 9, image 100, series 10, image 10, and series 11, image 20). This could be in part secondary to improved technique, but warrants attention on short-interval follow up. 2. Additional punctate foci of enhancement at the site of previously treated lesions, detailed above and similar when comparing over multiple priors. 3. Stable meningiomas. Electronically Signed   By: Feliberto Harts MD   On: 03/24/2020 17:30    Impression/Plan:  This visit was conducted via telephone to spare the patient unnecessary potential exposure in the healthcare setting during the current COVID-19 pandemic.  1. 69 y.o. female with metastatic  NSCLC of the left lower lobe lung with significant mediastinal/hilar lymph node involvement andat least20 subcentimeter brain metastases. She has recovered well from the effects of her whole brain radiotherapy and remains both clinically and radiographically stable. We reviewed her most recent brain MRI results from 10/28//21 which shows an overall stable post treatment appearance of the brain without any definite evidence of new or progressive metastatic disease and an unchanged appearance of the subcentimeter meningiomas overlying the bilateral frontal lobes. We discussed the recommendation to continue with serial brain MRI scans every 3 months to monitor for any signs of disease recurrence or progression with close attention to the 2 mm focus of enhancement along the posterior paramidline left parietal cortex.  She prefers to continue with telephone follow up visits following each scan to review findings and recommndations. She will also continue in routine follow up with Dr. Ellin Saba for management of her systemic disease.  The current plan is to continue on targeted systemic therapy with Osimertinib at a reduced dose of 40 mg daily.  She appears to have a good understanding of our recommendations and is in agreement with the stated plan. She knows to call at any time in the interim with questions or concerns related to her previous radiation.  2. Dizziness/vertigo. Managed with Meclazine, prescribed by her PCP, Dr. Ouida Sills and symptoms are currently resolved. We will continue to follow expectantly.  Given current concerns for patient exposure during the COVID-19 pandemic, this encounter was conducted via telephone. The patient was notified in advance and was offered a WebEX meeting to allow for face to face communication but unfortunately reported that she did not have the appropriate resources/technology to support such a visit and instead preferred to proceed with telephone consult. The patient has  given verbal consent for this type of encounter. The time spent during this encounter was 15 minutes and 50% of that time was spent in the coordination of her care. The attendants for this meeting include Kamin Niblack PA-C, and patient, Kaitlin [name]. [place]. During the encounter, Lleyton Byers PA-C was located at [facility] Radiation Oncology Department.  Patient, Kaitlin [name] was located at home.    Marguarite Arbour, PA-C

## 2020-04-14 ENCOUNTER — Other Ambulatory Visit (HOSPITAL_COMMUNITY): Payer: Self-pay | Admitting: Hematology

## 2020-04-14 IMAGING — MR MR SHOULDER*R* WO/W CM
6 of 11 series · 20 of 40 positions shown · IV contrast ([ID])
Comparison: None.

CLINICAL DATA: Right shoulder pain for 3 months.  No known injury.

EXAM:
MRI OF THE RIGHT SHOULDER WITHOUT AND WITH CONTRAST
TECHNIQUE: Multiplanar, multisequence MR imaging of the RIGHT shoulder was
performed before and after the administration of intravenous
contrast.
CONTRAST:  7mL GADAVIST GADOBUTROL 1 MMOL/ML IV SOLN

[Series 3: PD fat-sat · axial · 4.0mm · 0.27mm/px · z∈[-25,+66]mm · 3 of 20 slices shown (1 of 2)]
[im 1/20]
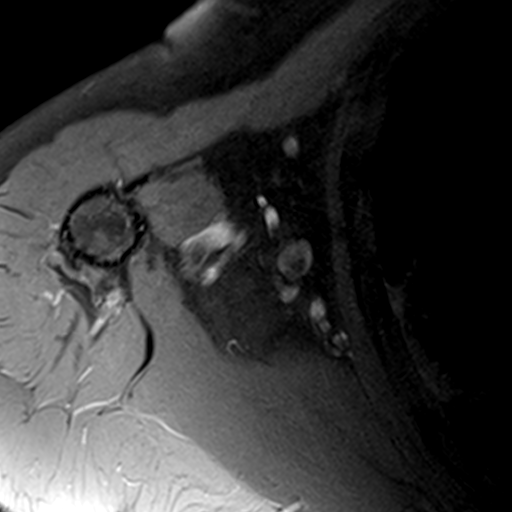
[im 10/20]
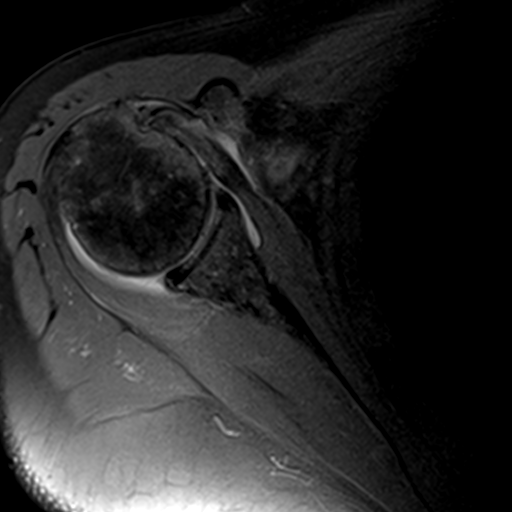
[im 20/20]
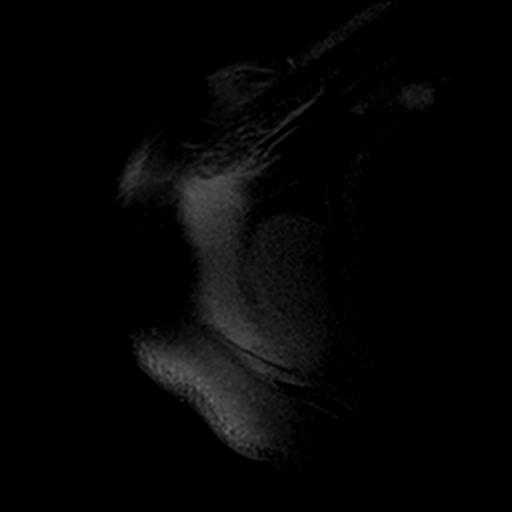

[Series 4: T2 fat-sat · axial · 4.0mm · 0.24mm/px · z∈[-66,+108]mm · 5 of 36 slices shown (1 of 2)]
[im 1/36]
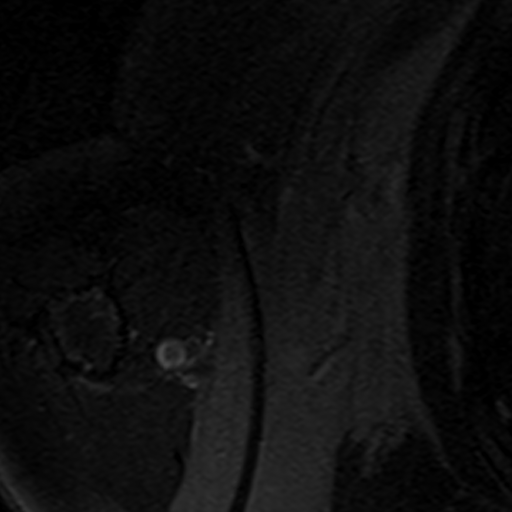
[im 9/36]
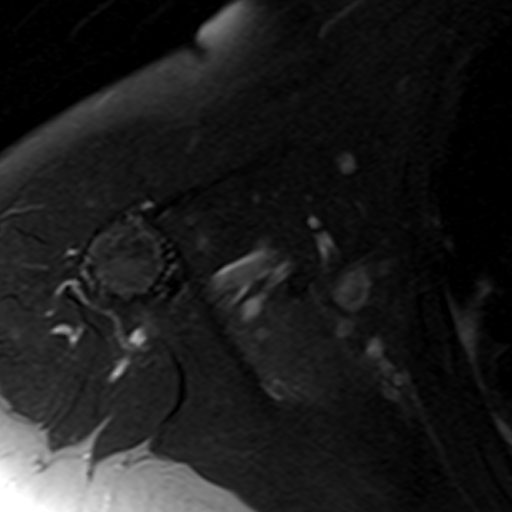
[im 18/36]
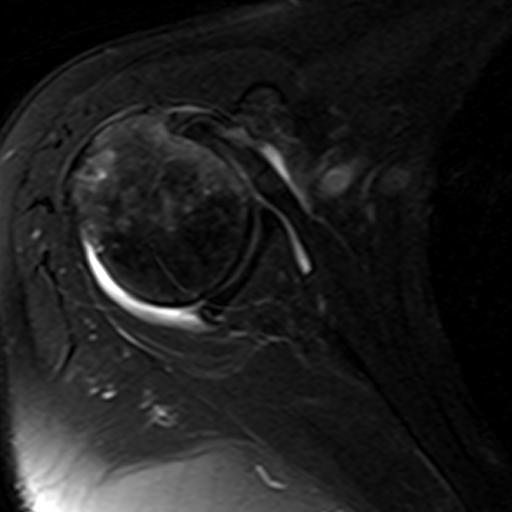
[im 27/36]
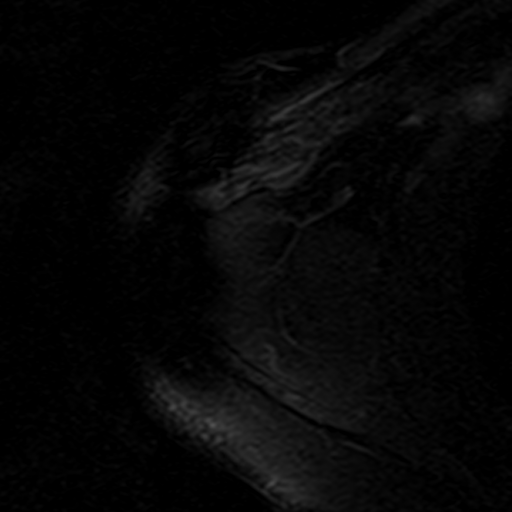
[im 36/36]
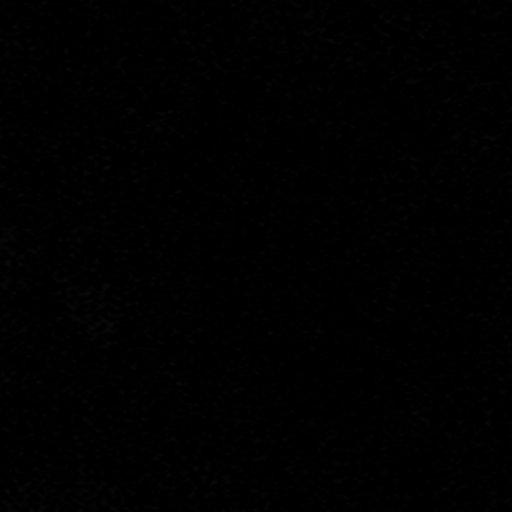

[Series 5: T1 · axial · 4.0mm · 0.27mm/px · z∈[-67,+108]mm · 5 of 36 slices shown]
[im 1/36]
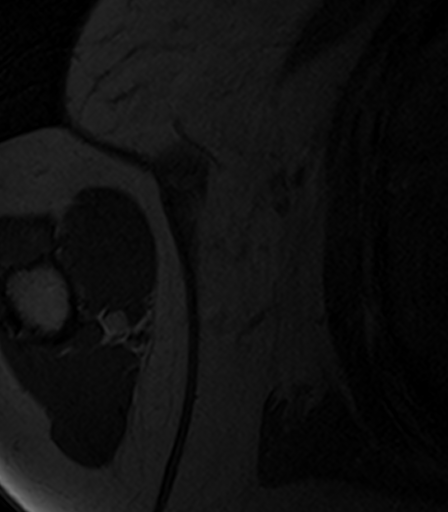
[im 9/36]
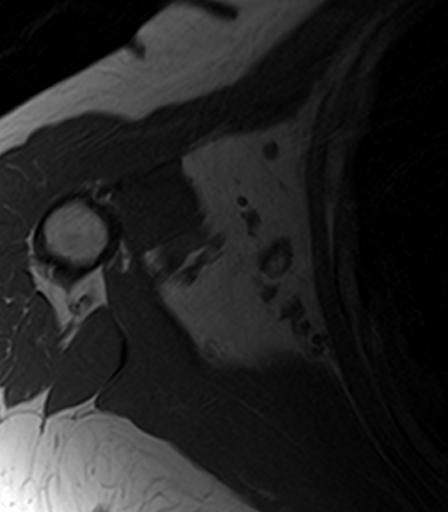
[im 18/36]
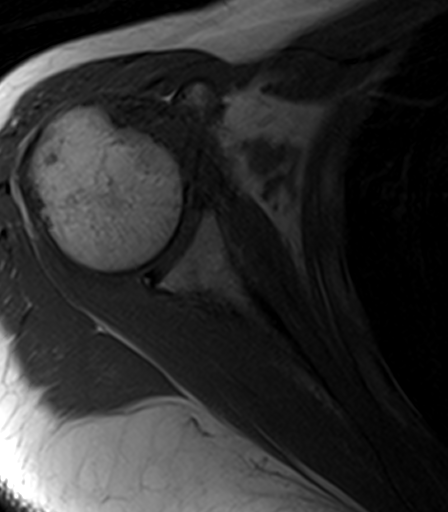
[im 27/36]
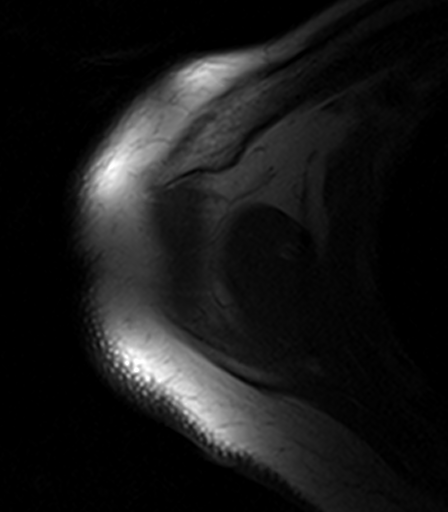
[im 36/36]
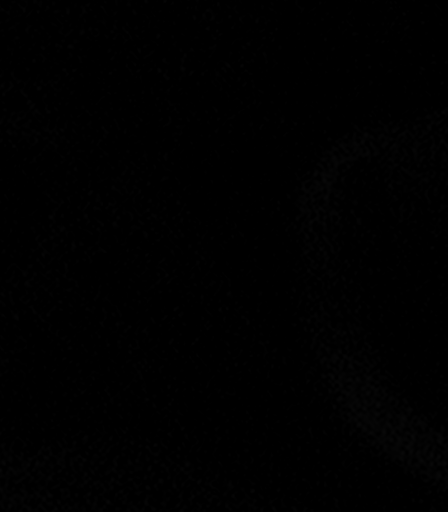

[Series 6: T1 fat-sat · axial · non-contrast · 4.0mm · 0.27mm/px · 1 of 36 slices shown]
[im 1/36]
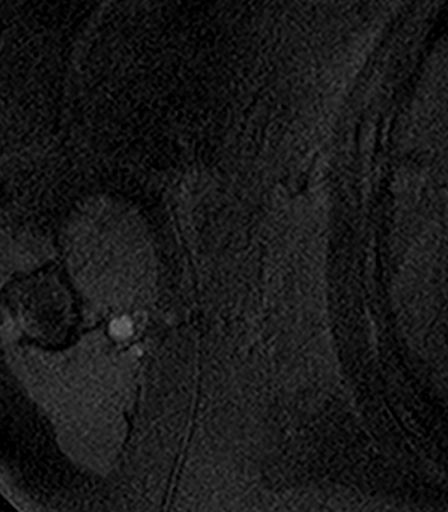

[Series 9: PD fat-sat · sagittal · 4.0mm · 0.28mm/px · 3 of 20 slices shown (2 of 2)]
[im 1/20]
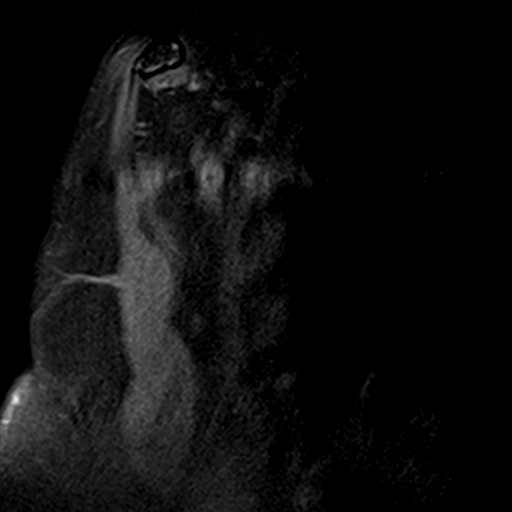
[im 10/20]
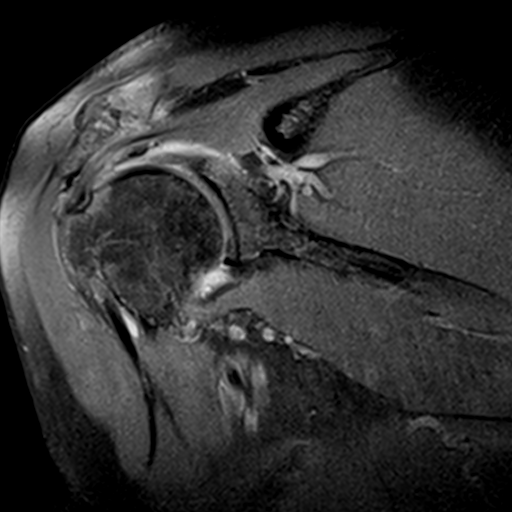
[im 20/20]
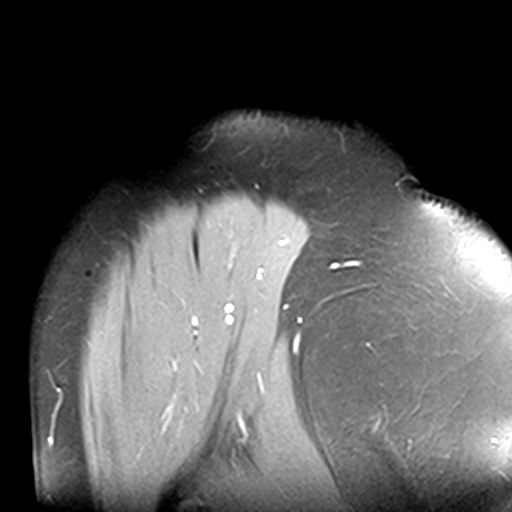

[Series 10: T2 fat-sat · oblique · 4.0mm · 0.26mm/px · 3 of 24 slices shown (2 of 2)]
[im 1/24]
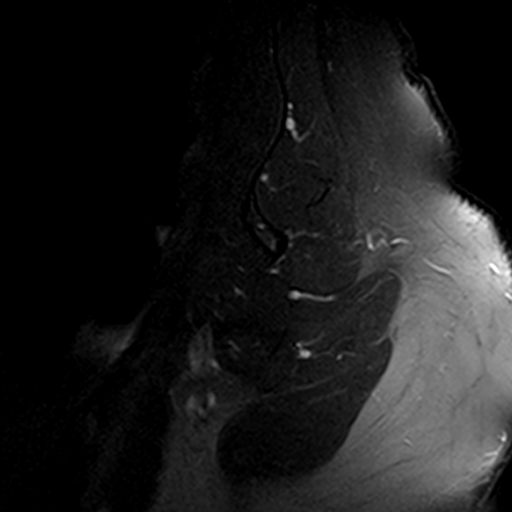
[im 12/24]
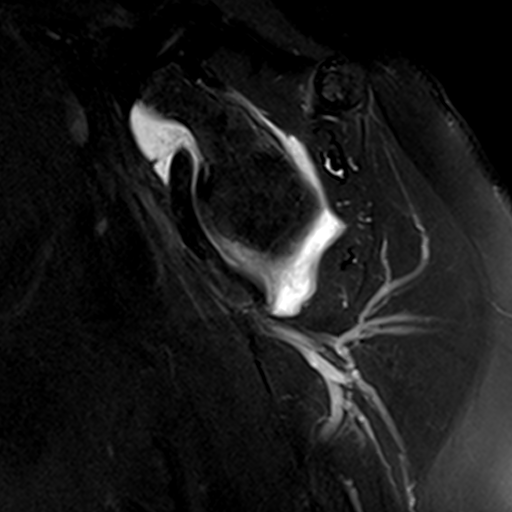
[im 24/24]
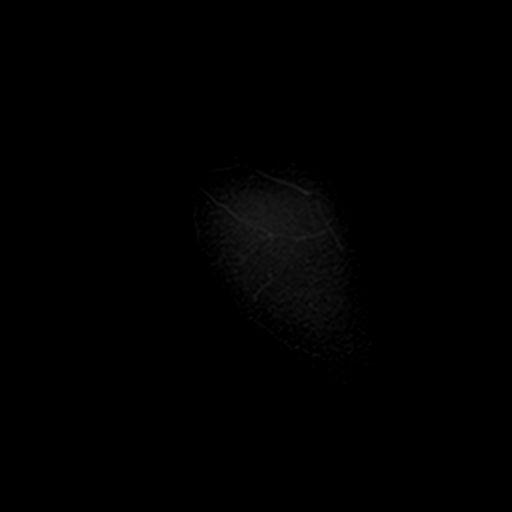

[20 of 40 positions shown; findings below may reference images not displayed]

FINDINGS: Rotator cuff: Moderate tendinosis of the supraspinatus and
infraspinatus tendons. Teres minor tendon is intact. Mild tendinosis
of the subscapularis tendon.

Muscles: No atrophy or fatty replacement of nor abnormal signal
within, the muscles of the rotator cuff.

Biceps long head: Severe tendinosis of the intra-articular portion
of the long head of the biceps tendon.

Acromioclavicular Joint: Severe arthropathy of the acromioclavicular
joint. Type I acromion. No subacromial/subdeltoid bursal fluid.

Glenohumeral Joint: Moderate joint effusion with synovitis. No
chondral defect.

Labrum: Grossly intact, but evaluation is limited by lack of
intraarticular fluid.

Bones: No acute fracture or dislocation. No aggressive osseous
lesion. Mild subcortical marrow edema in the anterior humeral head.

Other: No fluid collection or hematoma.  No soft tissue mass.
IMPRESSION: 1. Moderate tendinosis of the supraspinatus and infraspinatus
tendons.
2. Mild tendinosis of the subscapularis tendon.
3. Severe tendinosis of the intra-articular portion of the long head
of the biceps tendon.
4. Moderate joint effusion with synovitis. Differential
considerations include an inflammatory or crystalline arthropathy,
but septic arthritis cannot be completely excluded based on the MRI
appearance. Arthrocentesis may be helpful to further delineate
between the different etiologies.

## 2020-04-18 ENCOUNTER — Inpatient Hospital Stay (HOSPITAL_COMMUNITY): Payer: Medicare HMO | Attending: Hematology

## 2020-04-18 ENCOUNTER — Other Ambulatory Visit: Payer: Self-pay

## 2020-04-18 ENCOUNTER — Ambulatory Visit (HOSPITAL_COMMUNITY)
Admission: RE | Admit: 2020-04-18 | Discharge: 2020-04-18 | Disposition: A | Discharge status: Home / Self Care | Payer: Medicare HMO | Source: Ambulatory Visit | Attending: Hematology | Admitting: Hematology

## 2020-04-18 DIAGNOSIS — C3492 Malignant neoplasm of unspecified part of left bronchus or lung: Secondary | ICD-10-CM | POA: Insufficient documentation

## 2020-04-18 DIAGNOSIS — M533 Sacrococcygeal disorders, not elsewhere classified: Secondary | ICD-10-CM | POA: Diagnosis not present

## 2020-04-18 DIAGNOSIS — C7931 Secondary malignant neoplasm of brain: Secondary | ICD-10-CM | POA: Insufficient documentation

## 2020-04-18 DIAGNOSIS — C3490 Malignant neoplasm of unspecified part of unspecified bronchus or lung: Secondary | ICD-10-CM | POA: Diagnosis not present

## 2020-04-18 DIAGNOSIS — I708 Atherosclerosis of other arteries: Secondary | ICD-10-CM | POA: Diagnosis not present

## 2020-04-18 DIAGNOSIS — J90 Pleural effusion, not elsewhere classified: Secondary | ICD-10-CM | POA: Diagnosis not present

## 2020-04-18 DIAGNOSIS — M47814 Spondylosis without myelopathy or radiculopathy, thoracic region: Secondary | ICD-10-CM | POA: Diagnosis not present

## 2020-04-18 LAB — CBC WITH DIFFERENTIAL/PLATELET
Abs Immature Granulocytes: 0 10*3/uL (ref 0.00–0.07)
Basophils Absolute: 0 10*3/uL (ref 0.0–0.1)
Basophils Relative: 1 %
Eosinophils Absolute: 0.3 10*3/uL (ref 0.0–0.5)
Eosinophils Relative: 8 %
HCT: 47.2 % — ABNORMAL HIGH (ref 36.0–46.0)
Hemoglobin: 15.4 g/dL — ABNORMAL HIGH (ref 12.0–15.0)
Immature Granulocytes: 0 %
Lymphocytes Relative: 23 %
Lymphs Abs: 1 10*3/uL (ref 0.7–4.0)
MCH: 28.2 pg (ref 26.0–34.0)
MCHC: 32.6 g/dL (ref 30.0–36.0)
MCV: 86.3 fL (ref 80.0–100.0)
Monocytes Absolute: 0.5 10*3/uL (ref 0.1–1.0)
Monocytes Relative: 11 %
Neutro Abs: 2.6 10*3/uL (ref 1.7–7.7)
Neutrophils Relative %: 57 %
Platelets: 233 10*3/uL (ref 150–400)
RBC: 5.47 MIL/uL — ABNORMAL HIGH (ref 3.87–5.11)
RDW: 13.1 % (ref 11.5–15.5)
WBC: 4.4 10*3/uL (ref 4.0–10.5)
nRBC: 0 % (ref 0.0–0.2)

## 2020-04-18 LAB — COMPREHENSIVE METABOLIC PANEL
ALT: 20 U/L (ref 0–44)
AST: 22 U/L (ref 15–41)
Albumin: 4.4 g/dL (ref 3.5–5.0)
Alkaline Phosphatase: 77 U/L (ref 38–126)
Anion gap: 10 (ref 5–15)
BUN: 13 mg/dL (ref 8–23)
CO2: 25 mmol/L (ref 22–32)
Calcium: 9.9 mg/dL (ref 8.9–10.3)
Chloride: 104 mmol/L (ref 98–111)
Creatinine, Ser: 1.03 mg/dL — ABNORMAL HIGH (ref 0.44–1.00)
GFR, Estimated: 59 mL/min — ABNORMAL LOW (ref >60)
Glucose, Bld: 76 mg/dL (ref 70–99)
Potassium: 3.5 mmol/L (ref 3.5–5.1)
Sodium: 139 mmol/L (ref 135–145)
Total Bilirubin: 0.4 mg/dL (ref 0.3–1.2)
Total Protein: 7.7 g/dL (ref 6.5–8.1)

## 2020-04-18 MED ORDER — IOHEXOL 300 MG/ML  SOLN: 100.0000 mL | Freq: Once | INTRAMUSCULAR | Status: DC | PRN

## 2020-04-18 MED ORDER — IOHEXOL 300 MG/ML  SOLN
100.0000 mL | Freq: Once | INTRAMUSCULAR | Status: AC | PRN
  Administered 2020-04-18: 100 mL via INTRAVENOUS

## 2020-04-20 ENCOUNTER — Ambulatory Visit (HOSPITAL_COMMUNITY): Payer: Medicare HMO | Admitting: Hematology

## 2020-05-12 DIAGNOSIS — E1159 Type 2 diabetes mellitus with other circulatory complications: Secondary | ICD-10-CM | POA: Diagnosis not present

## 2020-05-12 DIAGNOSIS — I10 Essential (primary) hypertension: Secondary | ICD-10-CM | POA: Diagnosis not present

## 2020-05-12 DIAGNOSIS — Z79899 Other long term (current) drug therapy: Secondary | ICD-10-CM | POA: Diagnosis not present

## 2020-05-13 DIAGNOSIS — E1169 Type 2 diabetes mellitus with other specified complication: Secondary | ICD-10-CM | POA: Diagnosis not present

## 2020-05-13 DIAGNOSIS — R7309 Other abnormal glucose: Secondary | ICD-10-CM | POA: Diagnosis not present

## 2020-05-13 DIAGNOSIS — I10 Essential (primary) hypertension: Secondary | ICD-10-CM | POA: Diagnosis not present

## 2020-05-13 DIAGNOSIS — J4540 Moderate persistent asthma, uncomplicated: Secondary | ICD-10-CM | POA: Diagnosis not present

## 2020-05-13 DIAGNOSIS — Z23 Encounter for immunization: Secondary | ICD-10-CM | POA: Diagnosis not present

## 2020-05-13 DIAGNOSIS — E785 Hyperlipidemia, unspecified: Secondary | ICD-10-CM | POA: Diagnosis not present

## 2020-05-17 ENCOUNTER — Other Ambulatory Visit: Payer: Self-pay | Admitting: Radiation Therapy

## 2020-05-17 ENCOUNTER — Telehealth: Payer: Self-pay | Admitting: Radiation Therapy

## 2020-05-17 DIAGNOSIS — C7949 Secondary malignant neoplasm of other parts of nervous system: Secondary | ICD-10-CM

## 2020-05-17 NOTE — Telephone Encounter (Signed)
Spoke with [name] about her upcoming brain MRI and telephone follow-up with Kaitlin [name] afterwards. She has written the scan appointment down and plans to attend.   [name] is already scheduled for labs and to see Dr. Delton Coombes on 05/30/20. The labs will be within the 4 week tolerance to use for her brain MRI.   Kaitlin [name] R.T.(R)(T) Radiation Special Procedures Navigator

## 2020-05-30 ENCOUNTER — Ambulatory Visit (HOSPITAL_COMMUNITY): Payer: Medicare HMO | Admitting: Hematology

## 2020-05-31 ENCOUNTER — Other Ambulatory Visit (HOSPITAL_COMMUNITY): Payer: Self-pay

## 2020-05-31 ENCOUNTER — Other Ambulatory Visit: Payer: Self-pay

## 2020-05-31 ENCOUNTER — Inpatient Hospital Stay (HOSPITAL_COMMUNITY): Payer: Medicare HMO | Attending: Hematology | Admitting: Hematology

## 2020-05-31 VITALS — BP 151/84 | HR 94 | Temp 97.2°F | Resp 18 | Wt 203.4 lb

## 2020-05-31 DIAGNOSIS — C3492 Malignant neoplasm of unspecified part of left bronchus or lung: Secondary | ICD-10-CM | POA: Diagnosis not present

## 2020-05-31 DIAGNOSIS — Z79899 Other long term (current) drug therapy: Secondary | ICD-10-CM | POA: Insufficient documentation

## 2020-05-31 DIAGNOSIS — Z7901 Long term (current) use of anticoagulants: Secondary | ICD-10-CM | POA: Diagnosis not present

## 2020-05-31 DIAGNOSIS — Z86718 Personal history of other venous thrombosis and embolism: Secondary | ICD-10-CM | POA: Insufficient documentation

## 2020-05-31 DIAGNOSIS — C7931 Secondary malignant neoplasm of brain: Secondary | ICD-10-CM | POA: Insufficient documentation

## 2020-05-31 DIAGNOSIS — Z86711 Personal history of pulmonary embolism: Secondary | ICD-10-CM | POA: Insufficient documentation

## 2020-05-31 DIAGNOSIS — E119 Type 2 diabetes mellitus without complications: Secondary | ICD-10-CM | POA: Diagnosis not present

## 2020-05-31 DIAGNOSIS — I10 Essential (primary) hypertension: Secondary | ICD-10-CM | POA: Diagnosis not present

## 2020-05-31 MED ORDER — OSIMERTINIB MESYLATE 40 MG PO TABS: 40.0000 mg | ORAL_TABLET | Freq: Every day | ORAL | 2 refills | Status: DC

## 2020-05-31 NOTE — Patient Instructions (Addendum)
[place] at [facility] Discharge Instructions  You were seen today by Dr. Delton Coombes. He went over your recent results. Continue taking osimertinib daily. You will be scheduled for a CT scan of your chest before your next visit. Dr. Delton Coombes will see you back in 3 months for labs and follow up.   Thank you for choosing [place] at [facility] to provide your oncology and hematology care.  To afford each patient quality time with our provider, please arrive at least 15 minutes before your scheduled appointment time.   If you have a lab appointment with the [facility] please come in thru the Main Entrance and check in at the main information desk  You need to re-schedule your appointment should you arrive 10 or more minutes late.  We strive to give you quality time with our providers, and arriving late affects you and other patients whose appointments are after yours.  Also, if you no show three or more times for appointments you may be dismissed from the clinic at the providers discretion.     Again, thank you for choosing [facility].  Our hope is that these requests will decrease the amount of time that you wait before being seen by our physicians.       _____________________________________________________________  Should you have questions after your visit to [facility], please contact our office at [phone] between the hours of 8:00 a.m. and 4:30 p.m.  Voicemails left after 4:00 p.m. will not be returned until the following business day.  For prescription refill requests, have your pharmacy contact our office and allow 72 hours.    Cancer Center Support Programs:   > Cancer Support Group  2nd Tuesday of the month 1pm-2pm, Journey Room

## 2020-05-31 NOTE — Telephone Encounter (Signed)
Tagrisso refill faxed to [facility] per Dr. Delton Coombes

## 2020-05-31 NOTE — Progress Notes (Signed)
Kaitlin [name], Kaitlin [name] [id number]   CLINIC:  Medical Oncology/Hematology  PCP:  [name], MD [address] / [place] [address] [phone]   REASON FOR VISIT:  Follow-up for metastatic left lung cancer  PRIOR THERAPY: None  NGS Results: Guardant 360 EGFR exon 19 deletion  CURRENT THERAPY: Osimertinib 40 mg daily  BRIEF ONCOLOGIC HISTORY:  Oncology History   No history exists.    CANCER STAGING: Cancer Staging No matching staging information was found for the patient.  INTERVAL HISTORY:  Kaitlin [name], a 70 y.o. female, returns for routine follow-up of her metastatic left lung cancer. Kaitlin [name] was last seen on 03/08/2020.   Today she reports feeling well. She reports having a dry cough which is tolerable. She is tolerating osimertinib well and denies having any itching or rashes.   REVIEW OF SYSTEMS:  Review of Systems  Constitutional: Negative for appetite change and fatigue.  Respiratory: Positive for cough (dry).   Skin: Negative for itching and rash.  All other systems reviewed and are negative.   PAST MEDICAL/SURGICAL HISTORY:  Past Medical History:  Diagnosis Date  . Asthma   . Cancer ([place]) 04/21/2018   Lung Cancer   . Carpal tunnel syndrome   . Cataract    left eye  . Diabetes mellitus without complication ([place])   . DVT (deep venous thrombosis) ([facility])   . Hypertension   . Pneumonia 04/21/2018  . Pulmonary embolism ([place]) 03/2018  . Sepsis [facility])    Past Surgical History:  Procedure Laterality Date  . ABDOMINAL HYSTERECTOMY    . biopsy of right breast    . ESOPHAGOGASTRODUODENOSCOPY (EGD) WITH PROPOFOL N/A 09/12/2018   Procedure: ESOPHAGOGASTRODUODENOSCOPY (EGD) WITH PROPOFOL (scope in 1017, scope out 1032);  Surgeon: Virl Cagey, MD;  Location: [facility];  Service: General;  Laterality: N/A;  . FOOT SURGERY    . implant left eye    . INTRAOCULAR LENS INSERTION    . IR GASTROSTOMY TUBE MOD SED   12/05/2018  . PEG PLACEMENT N/A 09/12/2018   Procedure: PERCUTANEOUS ENDOSCOPIC GASTROSTOMY (PEG) PLACEMENT;  Surgeon: Virl Cagey, MD;  Location: [facility];  Service: General;  Laterality: N/A;    SOCIAL HISTORY:  Social History   Socioeconomic History  . Marital status: Widowed    Spouse name: Not on file  . Number of children: 4  . Years of education: 52  . Highest education level: High school graduate  Occupational History  . Not on file  Tobacco Use  . Smoking status: Never Smoker  . Smokeless tobacco: Never Used  Vaping Use  . Vaping Use: Never used  Substance and Sexual Activity  . Alcohol use: Not Currently    Comment: occ  . Drug use: No  . Sexual activity: Yes    Partners: Male    Birth control/protection: Surgical  Other Topics Concern  . 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   Social Determinants of Health   Financial Resource Strain: Not on file  Food Insecurity: Not on file  Transportation Needs: Not on file  Physical Activity: Not on file  Stress: Not on file  Social Connections: Not on file  Intimate Partner Violence:  Not on file    FAMILY HISTORY:  Family History  Problem Relation Age of Onset  . Heart failure Mother   . Stroke Mother   . Heart attack Mother   . Kidney failure Other   . Leukemia Father     CURRENT MEDICATIONS:  Current Outpatient Medications  Medication Sig Dispense Refill  . amLODipine (NORVASC) 5 MG tablet Take 10 mg by mouth daily.    . Colchicine (MITIGARE) 0.6 MG CAPS Take 0.6 mg by mouth daily as needed. 15 capsule 3  . enoxaparin (LOVENOX) 150 MG/ML injection Inject 1 mL (150 mg total) into the skin daily. 0 mL   . FLOVENT HFA 44 MCG/ACT inhaler Inhale 2 puffs into the lungs daily.     . hydrocortisone  (ANUSOL-HC) 2.5 % rectal cream     . latanoprost (XALATAN) 0.005 % ophthalmic solution Place 1 drop into both eyes in the morning and at bedtime.     . meclizine (ANTIVERT) 25 MG tablet Take 25 mg by mouth as needed.     . metoCLOPramide (REGLAN) 10 MG tablet Take 1 tablet (10 mg total) by mouth every 8 (eight) hours as needed for refractory nausea / vomiting. 30 tablet 0  . osimertinib mesylate (TAGRISSO) 40 MG tablet Take 1 tablet (40 mg total) by mouth daily. 30 tablet 2  . oxyCODONE-acetaminophen (PERCOCET/ROXICET) 5-325 MG tablet Take 1-2 tablets by mouth as needed.     . pantoprazole (PROTONIX) 40 MG tablet TAKE 1 TABLET BY MOUTH ONCE DAILY. 30 tablet 0  . potassium chloride (KLOR-CON) 10 MEQ tablet TAKE (2) TABLETS BY MOUTH TWICE DAILY. 120 tablet 3  . prochlorperazine (COMPAZINE) 10 MG tablet TAKE (1) TABLET BY MOUTH EVERY SIX HOURS AS NEEDED FOR NAUSEA OR VOMITING. 30 tablet 10  . albuterol (PROVENTIL HFA;VENTOLIN HFA) 108 (90 BASE) MCG/ACT inhaler Inhale 2 puffs into the lungs every 6 (six) hours as needed for wheezing.  (Patient not taking: Reported on 05/31/2020)    . albuterol (PROVENTIL) (2.5 MG/3ML) 0.083% nebulizer solution Take 3 mLs (2.5 mg total) by nebulization every 6 (six) hours as needed for wheezing or shortness of breath. (Patient not taking: Reported on 05/31/2020) 75 mL 2  . benzonatate (TESSALON) 100 MG capsule Take 1 capsule (100 mg total) by mouth 3 (three) times daily as needed for cough. (Patient not taking: Reported on 05/31/2020) 30 capsule 0  . HYDROcodone-homatropine (HYCODAN) 5-1.5 MG/5ML syrup Take 5 mLs by mouth every 6 (six) hours as needed for cough. (Patient not taking: Reported on 05/31/2020) 120 mL 0  . triamcinolone cream (KENALOG) 0.1 % Apply 1 application topically as needed.  (Patient not taking: Reported on 05/31/2020)    . zolpidem (AMBIEN CR) 6.25 MG CR tablet Take 1 tablet (6.25 mg total) by mouth at bedtime as needed for sleep. (Patient not taking: Reported on  05/31/2020) 30 tablet 0   No current facility-administered medications for this visit.    ALLERGIES:  Allergies  Allergen Reactions  . Banana Anaphylaxis  . Aspirin Other (See Comments)    G.I. Upset     PHYSICAL EXAM:  Performance status (ECOG): 1 - Symptomatic but completely ambulatory  Vitals:   05/31/20 1537  BP: (!) 151/84  Pulse: 94  Resp: 18  Temp: (!) 97.2 F (36.2 C)  SpO2: 100%   Wt Readings from Last 3 Encounters:  05/31/20 203 lb 6.4 oz (92.3 kg)  03/08/20 207 lb 3.2 oz (94 kg)  12/02/19 195 lb 6.4 oz (88.6  kg)   Physical Exam Vitals reviewed.  Constitutional:      Appearance: Normal appearance. She is obese.  Cardiovascular:     Rate and Rhythm: Normal rate and regular rhythm.     Pulses: Normal pulses.     Heart sounds: Normal heart sounds.  Pulmonary:     Effort: Pulmonary effort is normal.     Breath sounds: Normal breath sounds.  Musculoskeletal:     Right lower leg: No edema.     Left lower leg: No edema.  Neurological:     General: No focal deficit present.     Mental Status: She is alert and oriented to person, place, and time.  Psychiatric:        Mood and Affect: Mood normal.        Behavior: Behavior normal.      LABORATORY DATA:  I have reviewed the labs as listed.  CBC Latest Ref Rng & Units 04/18/2020 03/01/2020 11/24/2019  WBC 4.0 - 10.5 K/uL 4.4 4.7 3.7(L)  Hemoglobin 12.0 - 15.0 g/dL 15.4(H) 14.6 15.4(H)  Hematocrit 36.0 - 46.0 % 47.2(H) 45.4 48.5(H)  Platelets 150 - 400 K/uL 233 208 258   CMP Latest Ref Rng & Units 04/18/2020 03/01/2020 11/24/2019  Glucose 70 - 99 mg/dL 76 73 86  BUN 8 - 23 mg/dL _0 Creatinine 0.44 - 1.00 mg/dL 1.03(H) 1.28(H) 0.98  Sodium 135 - 145 mmol/L 139 137 140  Potassium 3.5 - 5.1 mmol/L 3.5 4.1 4.0  Chloride 98 - 111 mmol/L 104 101 103  CO2 22 - 32 mmol/L _1 Calcium 8.9 - 10.3 mg/dL 9.9 9.9 9.7  Total Protein 6.5 - 8.1 g/dL 7.7 7.4 7.6  Total Bilirubin 0.3 - 1.2 mg/dL 0.4 0.6 0.5   Alkaline Phos 38 - 126 U/L 77 77 79  AST 15 - 41 U/L _2 ALT 0 - 44 U/L _3 DIAGNOSTIC IMAGING:  I have independently reviewed the scans and discussed with the patient. No results found.   ASSESSMENT:  1. Metastatic adenocarcinoma of the left lung with multiple brain mets: -Guardant 360 with EGFR exon 19 deletion. Patient never smoker. -Osimertinib 80 mg daily started on 07/28/2018, dose reduced due to weight loss and decreased appetite in July 2020. -CT chest on 11/25/2019 shows stable disease with no evidence of recurrence or metastasis. -CT chest, abdomen and pelvis on 04/18/2020 with no findings of recurrent malignancy. Prior radiation therapy changes. Stable trace left pleural effusion.  2. Brain metastasis: -20 subcentimeter brain lesions and diagnosis. -Whole brain RT completed on 07/29/2018. -MRI of the brain with and without contrast on 09/12/2019 showed increased conspicuity of a 3 mm focus of enhancement at site of treated anterior left temporal lobe lesion. Increased conspicuity is related to improved imaging quality on the present exam. No new intracranial metastasis. -MRI of the brain with and without contrast on 12/21/2019 with no evidence of new or progressive disease.  Minimal punctate enhancement remains visible at the site of previously treated left temporal lesion.  4 mm meningioma at the right frontoparietal vertex and 8 mm meningioma at the left anterior cranial fossa convexity are unchanged without mass-effect upon the brain.   PLAN:  1. Metastatic adenocarcinoma of the left lung with multiple brain mets: -She is tolerating osimertinib 40 mg daily very well. -Reviewed labs which showed normal LFTs and CBC. -Reviewed CT chest which did not show any evidence of progression or new  lesions. -RTC 3 months with labs and repeat CT chest.  2. Brain metastasis: -MRI of the brain on 03/24/2020 reviewed. -Patient has a follow-up MRI later this  month.  3. Recurrent PE: -Continue Lovenox indefinitely. No bleeding issues.  4. Chronic cough: -She has occasional cough. Does not require any medication.   Orders placed this encounter:  No orders of the defined types were placed in this encounter.    Derek Jack, MD [place] [phone]   I, Milinda Antis, am acting as a scribe for Dr. Sanda Linger.  I, Derek Jack MD, have reviewed the above documentation for accuracy and completeness, and I agree with the above.

## 2020-06-01 ENCOUNTER — Telehealth (HOSPITAL_COMMUNITY): Payer: Self-pay | Admitting: Pharmacy Technician

## 2020-06-01 NOTE — Telephone Encounter (Signed)
Oral Oncology Patient Advocate Encounter  Patient stopped by [facility] on 05/31/20 to complete annual Medicare Re-enrollment attestations for [employer] Patient Assistance Program in an effort to reduce the patient's out of pocket expense for Tagrisso to $0.  I called [employer] Medicare help line and was able to submit patient attestations through their automated line.  Patient is approved for assistance for 2022.  [place] Patient [place] Phone [phone] Fax [phone] 06/01/2020 9:55 AM

## 2020-06-14 ENCOUNTER — Other Ambulatory Visit (HOSPITAL_COMMUNITY): Payer: Self-pay | Admitting: Internal Medicine

## 2020-06-14 ENCOUNTER — Other Ambulatory Visit: Payer: Self-pay

## 2020-06-14 ENCOUNTER — Ambulatory Visit (HOSPITAL_COMMUNITY)
Admission: RE | Admit: 2020-06-14 | Discharge: 2020-06-14 | Disposition: A | Discharge status: Home / Self Care | Payer: Medicare HMO | Source: Ambulatory Visit | Attending: Internal Medicine | Admitting: Internal Medicine

## 2020-06-14 DIAGNOSIS — M79651 Pain in right thigh: Secondary | ICD-10-CM

## 2020-06-14 DIAGNOSIS — M1611 Unilateral primary osteoarthritis, right hip: Secondary | ICD-10-CM | POA: Diagnosis not present

## 2020-06-14 DIAGNOSIS — M25551 Pain in right hip: Secondary | ICD-10-CM

## 2020-06-15 ENCOUNTER — Other Ambulatory Visit (HOSPITAL_COMMUNITY): Payer: Self-pay

## 2020-06-15 DIAGNOSIS — C3492 Malignant neoplasm of unspecified part of left bronchus or lung: Secondary | ICD-10-CM

## 2020-06-15 MED ORDER — OSIMERTINIB MESYLATE 40 MG PO TABS: 40.0000 mg | ORAL_TABLET | Freq: Every day | ORAL | 2 refills | Status: DC

## 2020-06-22 ENCOUNTER — Telehealth: Payer: Self-pay

## 2020-06-22 ENCOUNTER — Other Ambulatory Visit: Payer: Self-pay

## 2020-06-22 ENCOUNTER — Encounter: Payer: Self-pay | Admitting: Urology

## 2020-06-22 NOTE — Telephone Encounter (Signed)
Spoke with patient in regards to telephone visit with Freeman Caldron PA on 06/29/20 @ 10:00am. Patient verbalized understanding of appointment date and time. Reviewed meaningful use questions. TM

## 2020-06-24 ENCOUNTER — Ambulatory Visit (HOSPITAL_COMMUNITY)
Admission: RE | Admit: 2020-06-24 | Discharge: 2020-06-24 | Disposition: A | Discharge status: Home / Self Care | Payer: Medicare HMO | Source: Ambulatory Visit | Attending: Radiation Oncology | Admitting: Radiation Oncology

## 2020-06-24 ENCOUNTER — Other Ambulatory Visit: Payer: Self-pay

## 2020-06-24 DIAGNOSIS — H748X3 Other specified disorders of middle ear and mastoid, bilateral: Secondary | ICD-10-CM | POA: Diagnosis not present

## 2020-06-24 DIAGNOSIS — C7931 Secondary malignant neoplasm of brain: Secondary | ICD-10-CM | POA: Diagnosis not present

## 2020-06-24 DIAGNOSIS — J3489 Other specified disorders of nose and nasal sinuses: Secondary | ICD-10-CM | POA: Diagnosis not present

## 2020-06-24 DIAGNOSIS — G319 Degenerative disease of nervous system, unspecified: Secondary | ICD-10-CM | POA: Diagnosis not present

## 2020-06-24 DIAGNOSIS — C7949 Secondary malignant neoplasm of other parts of nervous system: Secondary | ICD-10-CM | POA: Insufficient documentation

## 2020-06-24 MED ORDER — GADOBUTROL 1 MMOL/ML IV SOLN
10.0000 mL | Freq: Once | INTRAVENOUS | Status: AC | PRN
  Administered 2020-06-24: 10 mL via INTRAVENOUS

## 2020-06-25 ENCOUNTER — Other Ambulatory Visit: Payer: Self-pay

## 2020-06-25 ENCOUNTER — Emergency Department (HOSPITAL_COMMUNITY)
Admission: EM | Admit: 2020-06-25 | Discharge: 2020-06-25 | Disposition: A | Discharge status: Home / Self Care | Payer: Medicare HMO | Attending: Emergency Medicine | Admitting: Emergency Medicine

## 2020-06-25 ENCOUNTER — Encounter (HOSPITAL_COMMUNITY): Payer: Self-pay | Admitting: Emergency Medicine

## 2020-06-25 ENCOUNTER — Emergency Department (HOSPITAL_COMMUNITY): Payer: Medicare HMO

## 2020-06-25 DIAGNOSIS — I10 Essential (primary) hypertension: Secondary | ICD-10-CM | POA: Diagnosis not present

## 2020-06-25 DIAGNOSIS — R29818 Other symptoms and signs involving the nervous system: Secondary | ICD-10-CM | POA: Diagnosis not present

## 2020-06-25 DIAGNOSIS — Z79899 Other long term (current) drug therapy: Secondary | ICD-10-CM | POA: Insufficient documentation

## 2020-06-25 DIAGNOSIS — C7931 Secondary malignant neoplasm of brain: Secondary | ICD-10-CM | POA: Insufficient documentation

## 2020-06-25 DIAGNOSIS — C801 Malignant (primary) neoplasm, unspecified: Secondary | ICD-10-CM | POA: Insufficient documentation

## 2020-06-25 DIAGNOSIS — E119 Type 2 diabetes mellitus without complications: Secondary | ICD-10-CM | POA: Insufficient documentation

## 2020-06-25 DIAGNOSIS — Z85118 Personal history of other malignant neoplasm of bronchus and lung: Secondary | ICD-10-CM | POA: Diagnosis not present

## 2020-06-25 DIAGNOSIS — C719 Malignant neoplasm of brain, unspecified: Secondary | ICD-10-CM | POA: Diagnosis not present

## 2020-06-25 DIAGNOSIS — M6281 Muscle weakness (generalized): Secondary | ICD-10-CM | POA: Diagnosis present

## 2020-06-25 DIAGNOSIS — J45909 Unspecified asthma, uncomplicated: Secondary | ICD-10-CM | POA: Diagnosis not present

## 2020-06-25 LAB — COMPREHENSIVE METABOLIC PANEL
ALT: 21 U/L (ref 0–44)
AST: 21 U/L (ref 15–41)
Albumin: 4.3 g/dL (ref 3.5–5.0)
Alkaline Phosphatase: 68 U/L (ref 38–126)
Anion gap: 7 (ref 5–15)
BUN: 14 mg/dL (ref 8–23)
CO2: 26 mmol/L (ref 22–32)
Calcium: 10 mg/dL (ref 8.9–10.3)
Chloride: 104 mmol/L (ref 98–111)
Creatinine, Ser: 1.12 mg/dL — ABNORMAL HIGH (ref 0.44–1.00)
GFR, Estimated: 53 mL/min — ABNORMAL LOW (ref >60)
Glucose, Bld: 97 mg/dL (ref 70–99)
Potassium: 4.1 mmol/L (ref 3.5–5.1)
Sodium: 137 mmol/L (ref 135–145)
Total Bilirubin: 0.4 mg/dL (ref 0.3–1.2)
Total Protein: 7.7 g/dL (ref 6.5–8.1)

## 2020-06-25 LAB — CBC WITH DIFFERENTIAL/PLATELET
Abs Immature Granulocytes: 0.01 10*3/uL (ref 0.00–0.07)
Basophils Absolute: 0.1 10*3/uL (ref 0.0–0.1)
Basophils Relative: 1 %
Eosinophils Absolute: 0.3 10*3/uL (ref 0.0–0.5)
Eosinophils Relative: 6 %
HCT: 46.4 % — ABNORMAL HIGH (ref 36.0–46.0)
Hemoglobin: 15 g/dL (ref 12.0–15.0)
Immature Granulocytes: 0 %
Lymphocytes Relative: 20 %
Lymphs Abs: 0.9 10*3/uL (ref 0.7–4.0)
MCH: 28 pg (ref 26.0–34.0)
MCHC: 32.3 g/dL (ref 30.0–36.0)
MCV: 86.6 fL (ref 80.0–100.0)
Monocytes Absolute: 0.7 10*3/uL (ref 0.1–1.0)
Monocytes Relative: 14 %
Neutro Abs: 2.7 10*3/uL (ref 1.7–7.7)
Neutrophils Relative %: 59 %
Platelets: 223 10*3/uL (ref 150–400)
RBC: 5.36 MIL/uL — ABNORMAL HIGH (ref 3.87–5.11)
RDW: 13.5 % (ref 11.5–15.5)
WBC: 4.7 10*3/uL (ref 4.0–10.5)
nRBC: 0 % (ref 0.0–0.2)

## 2020-06-25 MED ORDER — DEXAMETHASONE 4 MG PO TABS
4.0000 mg | ORAL_TABLET | Freq: Once | ORAL | Status: AC
  Administered 2020-06-25: 4 mg via ORAL
  Filled 2020-06-25: qty 1

## 2020-06-25 MED ORDER — DEXAMETHASONE 4 MG PO TABS: 4.0000 mg | ORAL_TABLET | Freq: Two times a day (BID) | ORAL | 0 refills | Status: DC

## 2020-06-25 NOTE — ED Triage Notes (Signed)
C/o weakness to left arm and confusion since Tuesday 06/21/2020 ([facility]).  Pt reports left arm feeling heavy since Tuesday 06/21/2020.  No history of stroke.  NIHSS negative.  Pt does c/o left arm feeling heavy intermittently.

## 2020-06-25 NOTE — ED Provider Notes (Signed)
[facility] Provider Note   CSN: [id number] Arrival date & time: 06/25/20  1300     History Chief Complaint  Patient presents with  . Extremity Weakness    left    Kaitlin [name] is a 70 y.o. female.  Pt complains of left arm weakness off and on for 5 days.    Pt had an mri yesterday shows more brain mets  The history is provided by the patient. No language interpreter was used.  Extremity Weakness This is a new problem. The current episode started more than 2 days ago. The problem occurs daily. The problem has been resolved. Pertinent negatives include no chest pain, no abdominal pain and no headaches. Nothing aggravates the symptoms. Nothing relieves the symptoms. She has tried nothing for the symptoms. The treatment provided no relief.       Past Medical History:  Diagnosis Date  . Asthma   . Cancer ([place]) 04/21/2018   Lung Cancer   . Carpal tunnel syndrome   . Cataract    left eye  . Diabetes mellitus without complication ([place])   . DVT (deep venous thrombosis) ([place])   . Hypertension   . Pneumonia 04/21/2018  . Pulmonary embolism ([place]) 03/2018  . Sepsis [place])     Patient Active Problem List   Diagnosis Date Noted  . Prolapsed internal hemorrhoids, grade 3 04/18/2019  . Anal polyp 04/18/2019  . Malnutrition of moderate degree 12/03/2018  . PEG tube malfunction ([place]) 12/02/2018  . History of pulmonary embolism 12/02/2018  . Protein-calorie malnutrition, severe 09/12/2018  . Metastatic lung cancer (metastasis from lung to other site), left ([place])   . Nausea and vomiting 09/11/2018  . Weight loss 09/11/2018  . Hypokalemia 09/09/2018  . Brain metastases ([place]) 07/12/2018  . Malignant pericardial effusion ([place]) 07/08/2018  . HCAP (healthcare-associated pneumonia) 07/06/2018  . Adenocarcinoma of lung, right ([place]) 07/06/2018  . Adenocarcinoma, lung, left ([place]) 07/03/2018  . Asthma 06/16/2018  . Hypertension 06/16/2018  . Diabetes mellitus  without complication ([place]) 96/[id number]/9381  . DVT (deep venous thrombosis) ([place]) 06/16/2018  . Pulmonary embolism during treatment with long-term anticoagulation therapy ([place]) 06/16/2018  . Lung mass   . Pulmonary emboli ([place]) 04/24/2018  . Routine screening for STI (sexually transmitted infection) 03/14/2016    Past Surgical History:  Procedure Laterality Date  . ABDOMINAL HYSTERECTOMY    . biopsy of right breast    . ESOPHAGOGASTRODUODENOSCOPY (EGD) WITH PROPOFOL N/A 09/12/2018   Procedure: ESOPHAGOGASTRODUODENOSCOPY (EGD) WITH PROPOFOL (scope in 1017, scope out 1032);  Surgeon: Virl Cagey, MD;  Location: [facility];  Service: General;  Laterality: N/A;  . FOOT SURGERY    . implant left eye    . INTRAOCULAR LENS INSERTION    . IR GASTROSTOMY TUBE MOD SED  12/05/2018  . PEG PLACEMENT N/A 09/12/2018   Procedure: PERCUTANEOUS ENDOSCOPIC GASTROSTOMY (PEG) PLACEMENT;  Surgeon: Virl Cagey, MD;  Location: [facility];  Service: General;  Laterality: N/A;     OB History    Gravida  4   Para  4   Term  4   Preterm      AB      Living        SAB      IAB      Ectopic      Multiple      Live Births              Family History  Problem Relation Age  of Onset  . Heart failure Mother   . Stroke Mother   . Heart attack Mother   . Kidney failure Other   . Leukemia Father     Social History   Tobacco Use  . Smoking status: Never Smoker  . Smokeless tobacco: Never Used  Vaping Use  . Vaping Use: Never used  Substance Use Topics  . Alcohol use: Not Currently    Comment: occ  . Drug use: No    Home Medications Prior to Admission medications   Medication Sig Start Date End Date Taking? Authorizing Provider  dexamethasone (DECADRON) 4 MG tablet Take 1 tablet (4 mg total) by mouth 2 (two) times daily. 06/25/20  Yes Milton Ferguson, MD  albuterol (PROVENTIL HFA;VENTOLIN HFA) 108 (90 BASE) MCG/ACT inhaler Inhale 2 puffs into the lungs every 6 (six) hours as needed  for wheezing.    [provider]  albuterol (PROVENTIL) (2.5 MG/3ML) 0.083% nebulizer solution Take 3 mLs (2.5 mg total) by nebulization every 6 (six) hours as needed for wheezing or shortness of breath. 04/29/18   Caren Griffins, MD  amLODipine (NORVASC) 5 MG tablet Take 10 mg by mouth daily.    [provider]  benzonatate (TESSALON) 100 MG capsule Take 1 capsule (100 mg total) by mouth 3 (three) times daily as needed for cough. Patient not taking: No sig reported 07/08/19   Derek Jack, MD  Colchicine (MITIGARE) 0.6 MG CAPS Take 0.6 mg by mouth daily as needed. 12/04/19   Derek Jack, MD  enoxaparin (LOVENOX) 150 MG/ML injection Inject 1 mL (150 mg total) into the skin daily. 12/06/18   Johnson, Clanford L, MD  FLOVENT HFA 44 MCG/ACT inhaler Inhale 2 puffs into the lungs daily.  06/04/19   [provider]  HYDROcodone-homatropine (HYCODAN) 5-1.5 MG/5ML syrup Take 5 mLs by mouth every 6 (six) hours as needed for cough. Patient not taking: No sig reported 12/04/19   Derek Jack, MD  hydrocortisone (ANUSOL-HC) 2.5 % rectal cream  11/19/18   [provider]  latanoprost (XALATAN) 0.005 % ophthalmic solution Place 1 drop into both eyes in the morning and at bedtime.  01/14/20   [provider]  meclizine (ANTIVERT) 25 MG tablet Take 25 mg by mouth as needed.  12/24/19   [provider]  metoCLOPramide (REGLAN) 10 MG tablet Take 1 tablet (10 mg total) by mouth every 8 (eight) hours as needed for refractory nausea / vomiting. 09/11/18   Mesner, Corene Cornea, MD  osimertinib mesylate (TAGRISSO) 40 MG tablet Take 1 tablet (40 mg total) by mouth daily. 06/15/20   Derek Jack, MD  oxyCODONE-acetaminophen (PERCOCET/ROXICET) 5-325 MG tablet Take 1-2 tablets by mouth as needed.  04/28/19   [provider]  pantoprazole (PROTONIX) 40 MG tablet TAKE 1 TABLET BY MOUTH ONCE DAILY. 03/25/19   Lockamy, Randi L, NP-C  potassium chloride  (KLOR-CON) 10 MEQ tablet TAKE (2) TABLETS BY MOUTH TWICE DAILY. 04/14/20   Derek Jack, MD  prochlorperazine (COMPAZINE) 10 MG tablet TAKE (1) TABLET BY MOUTH EVERY SIX HOURS AS NEEDED FOR NAUSEA OR VOMITING. 10/09/18   Lockamy, Randi L, NP-C  triamcinolone cream (KENALOG) 0.1 % Apply 1 application topically as needed.  Patient not taking: No sig reported 02/29/20   [provider]  zolpidem (AMBIEN CR) 6.25 MG CR tablet Take 1 tablet (6.25 mg total) by mouth at bedtime as needed for sleep. Patient not taking: No sig reported 10/16/18   Derek Jack, MD    Allergies  Banana and Aspirin  Review of Systems   Review of Systems  Constitutional: Negative for appetite change and fatigue.  HENT: Negative for congestion, ear discharge and sinus pressure.   Eyes: Negative for discharge.  Respiratory: Negative for cough.   Cardiovascular: Negative for chest pain.  Gastrointestinal: Negative for abdominal pain and diarrhea.  Genitourinary: Negative for frequency and hematuria.  Musculoskeletal: Positive for extremity weakness. Negative for back pain.  Skin: Negative for rash.  Neurological: Negative for seizures and headaches.       Weakness in left arm off and on for 5 days  Psychiatric/Behavioral: Negative for hallucinations.    Physical Exam Updated Vital Signs BP (!) 145/90   Pulse 86   Temp 98.7 F (37.1 C) (Oral)   Resp 16   Ht 5\' 7"  (1.702 m)   Wt 90.7 kg   SpO2 98%   BMI 31.32 kg/m   Physical Exam Vitals and nursing note reviewed.  Constitutional:      Appearance: She is well-developed.  HENT:     Head: Normocephalic.     Nose: Nose normal.  Eyes:     General: No scleral icterus.    Extraocular Movements: EOM normal.     Conjunctiva/sclera: Conjunctivae normal.  Neck:     Thyroid: No thyromegaly.  Cardiovascular:     Rate and Rhythm: Normal rate and regular rhythm.     Heart sounds: No murmur heard. No friction rub. No gallop.    Pulmonary:     Breath sounds: No stridor. No wheezing or rales.  Chest:     Chest wall: No tenderness.  Abdominal:     General: There is no distension.     Tenderness: There is no abdominal tenderness. There is no rebound.  Musculoskeletal:        General: No edema. Normal range of motion.     Cervical back: Neck supple.  Lymphadenopathy:     Cervical: No cervical adenopathy.  Skin:    Findings: No erythema or rash.  Neurological:     Mental Status: She is alert and oriented to person, place, and time.     Motor: No abnormal muscle tone.     Coordination: Coordination normal.  Psychiatric:        Mood and Affect: Mood and affect normal.        Behavior: Behavior normal.     ED Results / Procedures / Treatments   Labs (all labs ordered are listed, but only abnormal results are displayed) Labs Reviewed  CBC WITH DIFFERENTIAL/PLATELET - Abnormal; Notable for the following components:      Result Value   RBC 5.36 (*)    HCT 46.4 (*)    All other components within normal limits  COMPREHENSIVE METABOLIC PANEL - Abnormal; Notable for the following components:   Creatinine, Ser 1.12 (*)    GFR, Estimated 53 (*)    All other components within normal limits  URINALYSIS, ROUTINE W REFLEX MICROSCOPIC    EKG None  Radiology CT Head Wo Contrast  Result Date: 06/25/2020 CLINICAL DATA:  70 year old female with neuro deficit, stroke suspected. MRI dated yesterday demonstrates findings concerning for leptomeningeal metastatic disease or meningitis EXAM: CT HEAD WITHOUT CONTRAST TECHNIQUE: Contiguous axial images were obtained from the base of the skull through the vertex without intravenous contrast. COMPARISON:  MRI 06/24/2020 FINDINGS: Brain: No acute intracranial hemorrhage. No midline shift or mass effect. Gray-white differentiation maintained. Unremarkable appearance of the ventricular system. There are punctate hyperdensities in the left  frontal (image 14, 13), and left cerebellar  (image 9) region. No prior CT comparison. No confluent hypodensity to indicate edema. Mild volume loss Vascular: Intracranial atherosclerosis. Skull: No acute fracture.  No aggressive bone lesion identified. Sinuses/Orbits: Partial opacification of ethmoid air cells, frontal sinuses, with surgical changes of the ethmoids and a right maxillary antrectomy, probable left maxillary antrectomy. On the left soft tissue fills the presumed defect. No mastoid fluid. Sphenoid air cells are clear. Other: None IMPRESSION: No acute intracranial abnormality. Punctate hyperdensities in the left frontal region and left cerebellar hemisphere, likely correspond to the treated metastases discussed on yesterday's MRI. The regions of leptomeningeal enhancement and new foci of enhancement concerning for progression have no correlate on the current CT. Electronically Signed   By: Corrie Mckusick D.O.   On: 06/25/2020 14:19   MR Brain W Wo Contrast  Result Date: 06/24/2020 CLINICAL DATA:  Follow-up metastatic non-small cell lung cancer with history of brain metastases treated with whole-brain radiation therapy. EXAM: MRI HEAD WITHOUT AND WITH CONTRAST TECHNIQUE: Multiplanar, multiecho pulse sequences of the brain and surrounding structures were obtained without and with intravenous contrast. CONTRAST:  61mL GADAVIST GADOBUTROL 1 MMOL/ML IV SOLN COMPARISON:  03/24/2020 FINDINGS: Brain: A 3 mm enhancing focus in the paramedian left parietal lobe has slightly enlarged (series 11, image 212, previously 2 mm). A 2 mm focus of enhancement in the left parieto-occipital white matter is new (series 1100, image 196). There are 6 other punctate foci of enhancement scattered in both cerebral hemispheres which are more conspicuous than on the prior study but all correspond to previously treated metastases and with the better visualization today attributed to differences in technique. There is new abnormal leptomeningeal enhancement within multiple  right parietal, right occipital, and high right frontal sulci. There are 2 or 3 punctate foci of cortical/subcortical trace diffusion weighted signal hyperintensity in the right parietal lobe without definite reduced ADC. Patchy T2 hyperintensities in the cerebral white matter bilaterally have slightly progressed and are nonspecific but likely reflect a combination of chronic small vessel ischemia and post treatment changes. There is mild cerebral atrophy. A partially empty sella is unchanged. 6 mm enhancing extra-axial masses over the left frontal and right frontoparietal convexities are unchanged and consistent with meningiomas (series 1100, images 245 and 250). Vascular: Major intracranial vascular flow voids are preserved. Skull and upper cervical spine: No suspicious marrow lesion. Sinuses/Orbits: Prior functional endoscopic sinus surgery. Mild scattered mucosal thickening in the paranasal sinuses. Trace bilateral mastoid fluid. Other: None. IMPRESSION: 1. 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 Infection/meningitis could have a similar appearance but there are no reported symptoms to suggest this. While a couple of punctate subacute infarcts are questioned in the right parietal lobe, there is no large area of ischemia to suggest that this instead represents benign vascular enhancement. 2. New 2 mm enhancing lesion in the left parieto-occipital white matter suspicious for a metastasis. 3. Minimally increased size of a treated left parietal metastasis. 4. Additional punctate foci of cerebral enhancement bilaterally correspond to previously treated metastases. 5. Unchanged small left frontal and right frontoparietal meningiomas. Electronically Signed   By: Logan Bores M.D.   On: 06/24/2020 15:26    Procedures Procedures   Medications Ordered in ED Medications  dexamethasone (DECADRON) tablet 4 mg (has no administration in time range)     ED Course  I have reviewed the triage vital signs and the nursing notes.  Pertinent labs & imaging results that  were available during my care of the patient were reviewed by me and considered in my medical decision making (see chart for details). I spoke with Dr. Malachy Moan and he stated to start decadron and he will follow up with her this week.   MDM Rules/Calculators/A&P                         Pt with metastatic lung cancer.  She will start decadron and follow up with her onc  Final Clinical Impression(s) / ED Diagnoses Final diagnoses:  Brain metastases ([place])    Rx / DC Orders ED Discharge Orders         Ordered    dexamethasone (DECADRON) 4 MG tablet  2 times daily        06/25/20 1623           Milton Ferguson, MD 06/25/20 1631

## 2020-06-25 NOTE — Discharge Instructions (Addendum)
Call your doctor on Monday for an appointment early this week.   Dr. Worthy Keeler

## 2020-06-27 ENCOUNTER — Inpatient Hospital Stay: Payer: Medicare HMO | Attending: Radiation Oncology

## 2020-06-27 DIAGNOSIS — M6281 Muscle weakness (generalized): Secondary | ICD-10-CM | POA: Diagnosis not present

## 2020-06-27 DIAGNOSIS — C7931 Secondary malignant neoplasm of brain: Secondary | ICD-10-CM | POA: Diagnosis not present

## 2020-06-28 ENCOUNTER — Other Ambulatory Visit: Payer: Self-pay

## 2020-06-28 ENCOUNTER — Inpatient Hospital Stay (HOSPITAL_COMMUNITY): Payer: Medicare HMO | Attending: Hematology | Admitting: Hematology

## 2020-06-28 VITALS — BP 125/79 | HR 100 | Temp 97.3°F | Resp 20 | Wt 197.2 lb

## 2020-06-28 DIAGNOSIS — E119 Type 2 diabetes mellitus without complications: Secondary | ICD-10-CM | POA: Insufficient documentation

## 2020-06-28 DIAGNOSIS — Z923 Personal history of irradiation: Secondary | ICD-10-CM | POA: Insufficient documentation

## 2020-06-28 DIAGNOSIS — C7931 Secondary malignant neoplasm of brain: Secondary | ICD-10-CM | POA: Insufficient documentation

## 2020-06-28 DIAGNOSIS — Z86718 Personal history of other venous thrombosis and embolism: Secondary | ICD-10-CM | POA: Diagnosis not present

## 2020-06-28 DIAGNOSIS — C3492 Malignant neoplasm of unspecified part of left bronchus or lung: Secondary | ICD-10-CM | POA: Insufficient documentation

## 2020-06-28 DIAGNOSIS — Z86711 Personal history of pulmonary embolism: Secondary | ICD-10-CM | POA: Insufficient documentation

## 2020-06-28 DIAGNOSIS — R053 Chronic cough: Secondary | ICD-10-CM | POA: Diagnosis not present

## 2020-06-28 DIAGNOSIS — R531 Weakness: Secondary | ICD-10-CM | POA: Diagnosis not present

## 2020-06-28 DIAGNOSIS — I10 Essential (primary) hypertension: Secondary | ICD-10-CM | POA: Diagnosis not present

## 2020-06-28 NOTE — Patient Instructions (Signed)
[facility] at [facility] Discharge Instructions  You were seen today by Dr. Delton Coombes. He went over your recent results and scans. You will be scheduled for a PET scan of your body before your next visit. Dr. Delton Coombes will see you back after the PET scan for follow up.   Thank you for choosing [place] at [facility] to provide your oncology and hematology care.  To afford each patient quality time with our provider, please arrive at least 15 minutes before your scheduled appointment time.   If you have a lab appointment with the [facility] [place] please come in thru the Main Entrance and check in at the main information desk  You need to re-schedule your appointment should you arrive 10 or more minutes late.  We strive to give you quality time with our providers, and arriving late affects you and other patients whose appointments are after yours.  Also, if you no show three or more times for appointments you may be dismissed from the clinic at the providers discretion.     Again, thank you for choosing [facility].  Our hope is that these requests will decrease the amount of time that you wait before being seen by our physicians.       _____________________________________________________________  Should you have questions after your visit to [facility], please contact our office at [phone] between the hours of 8:00 a.m. and 4:30 p.m.  Voicemails left after 4:00 p.m. will not be returned until the following business day.  For prescription refill requests, have your pharmacy contact our office and allow 72 hours.    Cancer Center Support Programs:   > Cancer Support Group  2nd Tuesday of the month 1pm-2pm, Journey Room

## 2020-06-28 NOTE — Progress Notes (Signed)
Kaitlin [name], [place] [id number]   CLINIC:  Medical Oncology/Hematology  PCP:  [name], MD [address] / [place] [address] [phone]   REASON FOR VISIT:  Follow-up for metastatic left lung cancer  PRIOR THERAPY:  1. Left lung 30 Gy in 10 fractions from 07/16/2018 to 07/29/2018. 2. Whole brain 30 Gy in 10 fractions from 07/16/2018 to 07/29/2018.  NGS Results: Guardant 360 EGFR exon 19 deletion  CURRENT THERAPY: Osimertinib 40 mg daily  BRIEF ONCOLOGIC HISTORY:  Oncology History   No history exists.    CANCER STAGING: Cancer Staging No matching staging information was found for the patient.  INTERVAL HISTORY:  Kaitlin [name], a 70 y.o. female, returns for routine follow-up of her metastatic left lung cancer. Kaitlin [name] was last seen on 05/31/2020. She went to [facility] on 01/29 after complaining of intermittent left arm weakness.  Today she is accompanied by her daughter and she reports feeling fair. She notes that she woke up last week and her left arm felt heavy and flaccid lasting about 5 minutes, and the weakness occurs daily, though no weakness in her left leg. She received Decadron 4 mg at [facility] and is taking it BID. She continues taking Lovenox daily and denies having nosebleeds, hematuria or hematochezia. She denies having vision changes or recent infections, though she continues having intermittent headache across her forehead; she cannot recall if this is a new headache or a chronic one. She continues taking osimertinib 40 mg daily.  Dr. Tammi Klippel is supposed to call her with the MRI results on 02/02.   REVIEW OF SYSTEMS:  Review of Systems  Constitutional: Positive for fatigue (90%). Negative for appetite change.  HENT:   Negative for nosebleeds.   Eyes: Negative for eye problems.  Gastrointestinal: Negative for blood in stool.  Genitourinary: Negative for hematuria.   Neurological: Positive for extremity weakness  (intermittent L arm weakness) and headaches (intermittent across forehead).       Memory loss  All other systems reviewed and are negative.   PAST MEDICAL/SURGICAL HISTORY:  Past Medical History:  Diagnosis Date  . Asthma   . Cancer ([place]) 04/21/2018   Lung Cancer   . Carpal tunnel syndrome   . Cataract    left eye  . Diabetes mellitus without complication ([place])   . DVT (deep venous thrombosis) ([place])   . Hypertension   . Pneumonia 04/21/2018  . Pulmonary embolism ([place]) 03/2018  . Sepsis [facility])    Past Surgical History:  Procedure Laterality Date  . ABDOMINAL HYSTERECTOMY    . biopsy of right breast    . ESOPHAGOGASTRODUODENOSCOPY (EGD) WITH PROPOFOL N/A 09/12/2018   Procedure: ESOPHAGOGASTRODUODENOSCOPY (EGD) WITH PROPOFOL (scope in 1017, scope out 1032);  Surgeon: Virl Cagey, MD;  Location: [facility];  Service: General;  Laterality: N/A;  . FOOT SURGERY    . implant left eye    . INTRAOCULAR LENS INSERTION    . IR GASTROSTOMY TUBE MOD SED  12/05/2018  . PEG PLACEMENT N/A 09/12/2018   Procedure: PERCUTANEOUS ENDOSCOPIC GASTROSTOMY (PEG) PLACEMENT;  Surgeon: Virl Cagey, MD;  Location: [facility];  Service: General;  Laterality: N/A;    SOCIAL HISTORY:  Social History   Socioeconomic History  . Marital status: Widowed    Spouse name: Not on file  . Number of children: 4  . Years of education: 67  . Highest education level: High school graduate  Occupational History  .  Not on file  Tobacco Use  . Smoking status: Never Smoker  . Smokeless tobacco: Never Used  Vaping Use  . Vaping Use: Never used  Substance and Sexual Activity  . Alcohol use: Not Currently    Comment: occ  . Drug use: No  . Sexual activity: Yes    Partners: Male    Birth control/protection: Surgical  Other Topics Concern  . 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   Social Determinants of Health   Financial Resource Strain: Not on file  Food Insecurity: Not on file  Transportation Needs: Not on file  Physical Activity: Not on file  Stress: Not on file  Social Connections: Not on file  Intimate Partner Violence: Not on file    FAMILY HISTORY:  Family History  Problem Relation Age of Onset  . Heart failure Mother   . Stroke Mother   . Heart attack Mother   . Kidney failure Other   . Leukemia Father     CURRENT MEDICATIONS:  Current Outpatient Medications  Medication Sig Dispense Refill  . albuterol (PROVENTIL HFA;VENTOLIN HFA) 108 (90 BASE) MCG/ACT inhaler Inhale 2 puffs into the lungs every 6 (six) hours as needed for wheezing.    Marland Kitchen albuterol (PROVENTIL) (2.5 MG/3ML) 0.083% nebulizer solution Take 3 mLs (2.5 mg total) by nebulization every 6 (six) hours as needed for wheezing or shortness of breath. 75 mL 2  . amLODipine (NORVASC) 10 MG tablet Take 10 mg by mouth daily.    Marland Kitchen amLODipine (NORVASC) 5 MG tablet Take 10 mg by mouth daily.    Marland Kitchen atorvastatin (LIPITOR) 20 MG tablet Take 20 mg by mouth daily.    . benzonatate (TESSALON) 100 MG capsule Take 1 capsule (100 mg total) by mouth 3 (three) times daily as needed for cough. 30 capsule 0  . Colchicine (MITIGARE) 0.6 MG CAPS Take 0.6 mg by mouth daily as needed. 15 capsule 3  . dexamethasone (DECADRON) 4 MG tablet Take 1 tablet (4 mg total) by mouth 2 (two) times daily. 20 tablet 0  . enoxaparin (LOVENOX) 150 MG/ML injection Inject 1 mL (150 mg total) into the skin daily. 0 mL   . FLOVENT HFA 44 MCG/ACT inhaler Inhale 2 puffs into the lungs daily.     Marland Kitchen HYDROcodone-homatropine (HYCODAN) 5-1.5 MG/5ML syrup Take 5 mLs by mouth every 6 (six) hours as needed for cough. 120 mL 0  . hydrocortisone (ANUSOL-HC) 2.5 % rectal cream     . latanoprost (XALATAN) 0.005 % ophthalmic solution Place 1 drop into both eyes in  the morning and at bedtime.     . meclizine (ANTIVERT) 25 MG tablet Take 25 mg by mouth as needed.     . metoCLOPramide (REGLAN) 10 MG tablet Take 1 tablet (10 mg total) by mouth every 8 (eight) hours as needed for refractory nausea / vomiting. 30 tablet 0  . osimertinib mesylate (TAGRISSO) 40 MG tablet Take 1 tablet (40 mg total) by mouth daily. 30 tablet 2  . oxyCODONE-acetaminophen (PERCOCET/ROXICET) 5-325 MG tablet Take 1-2 tablets by mouth as needed.     . pantoprazole (PROTONIX) 40 MG tablet TAKE 1 TABLET BY MOUTH ONCE DAILY. 30 tablet 0  . potassium chloride (KLOR-CON) 10 MEQ tablet  TAKE (2) TABLETS BY MOUTH TWICE DAILY. 120 tablet 3  . predniSONE (DELTASONE) 20 MG tablet Take 20 mg by mouth 2 (two) times daily.    . prochlorperazine (COMPAZINE) 10 MG tablet TAKE (1) TABLET BY MOUTH EVERY SIX HOURS AS NEEDED FOR NAUSEA OR VOMITING. 30 tablet 10  . triamcinolone cream (KENALOG) 0.1 % Apply 1 application topically as needed.    . zolpidem (AMBIEN CR) 6.25 MG CR tablet Take 1 tablet (6.25 mg total) by mouth at bedtime as needed for sleep. 30 tablet 0   No current facility-administered medications for this visit.    ALLERGIES:  Allergies  Allergen Reactions  . Banana Anaphylaxis  . Aspirin Other (See Comments)    G.I. Upset     PHYSICAL EXAM:  Performance status (ECOG): 1 - Symptomatic but completely ambulatory  Vitals:   06/28/20 1205  BP: 125/79  Pulse: 100  Resp: 20  Temp: (!) 97.3 F (36.3 C)  SpO2: 100%   Wt Readings from Last 3 Encounters:  06/28/20 197 lb 3.2 oz (89.4 kg)  06/25/20 200 lb (90.7 kg)  05/31/20 203 lb 6.4 oz (92.3 kg)   Physical Exam Vitals reviewed.  Constitutional:      Appearance: Normal appearance.  Neurological:     General: No focal deficit present.     Mental Status: She is alert and oriented to person, place, and time.     Motor: No weakness or abnormal muscle tone.  Psychiatric:        Mood and Affect: Mood normal.        Behavior:  Behavior normal.      LABORATORY DATA:  I have reviewed the labs as listed.  CBC Latest Ref Rng & Units 06/25/2020 04/18/2020 03/01/2020  WBC 4.0 - 10.5 K/uL 4.7 4.4 4.7  Hemoglobin 12.0 - 15.0 g/dL 15.0 15.4(H) 14.6  Hematocrit 36.0 - 46.0 % 46.4(H) 47.2(H) 45.4  Platelets 150 - 400 K/uL 223 233 208   CMP Latest Ref Rng & Units 06/25/2020 04/18/2020 03/01/2020  Glucose 70 - 99 mg/dL 97 76 73  BUN 8 - 23 mg/dL $Remove'14 13 14  'ibptbAg$ Creatinine 0.44 - 1.00 mg/dL 1.12(H) 1.03(H) 1.28(H)  Sodium 135 - 145 mmol/L 137 139 137  Potassium 3.5 - 5.1 mmol/L 4.1 3.5 4.1  Chloride 98 - 111 mmol/L 104 104 101  CO2 22 - 32 mmol/L $RemoveB'26 25 26  'KDouGXwD$ Calcium 8.9 - 10.3 mg/dL 10.0 9.9 9.9  Total Protein 6.5 - 8.1 g/dL 7.7 7.7 7.4  Total Bilirubin 0.3 - 1.2 mg/dL 0.4 0.4 0.6  Alkaline Phos 38 - 126 U/L 68 77 77  AST 15 - 41 U/L $Remo'21 22 22  'hLtyc$ ALT 0 - 44 U/L $Remo'21 20 25    'PEmCL$ DIAGNOSTIC IMAGING:  I have independently reviewed the scans and discussed with the patient. CT Head Wo Contrast  Result Date: 06/25/2020 CLINICAL DATA:  70 year old female with neuro deficit, stroke suspected. MRI dated yesterday demonstrates findings concerning for leptomeningeal metastatic disease or meningitis EXAM: CT HEAD WITHOUT CONTRAST TECHNIQUE: Contiguous axial images were obtained from the base of the skull through the vertex without intravenous contrast. COMPARISON:  MRI 06/24/2020 FINDINGS: Brain: No acute intracranial hemorrhage. No midline shift or mass effect. Gray-white differentiation maintained. Unremarkable appearance of the ventricular system. There are punctate hyperdensities in the left frontal (image 14, 13), and left cerebellar (image 9) region. No prior CT comparison. No confluent hypodensity to indicate edema. Mild volume loss Vascular: Intracranial atherosclerosis. Skull: No acute fracture.  No aggressive bone lesion identified. Sinuses/Orbits: Partial opacification of ethmoid air cells, frontal sinuses, with surgical changes of the  ethmoids and a right maxillary antrectomy, probable left maxillary antrectomy. On the left soft tissue fills the presumed defect. No mastoid fluid. Sphenoid air cells are clear. Other: None IMPRESSION: No acute intracranial abnormality. Punctate hyperdensities in the left frontal region and left cerebellar hemisphere, likely correspond to the treated metastases discussed on yesterday's MRI. The regions of leptomeningeal enhancement and new foci of enhancement concerning for progression have no correlate on the current CT. Electronically Signed   By: Corrie Mckusick D.O.   On: 06/25/2020 14:19   MR Brain W Wo Contrast  Result Date: 06/24/2020 CLINICAL DATA:  Follow-up metastatic non-small cell lung cancer with history of brain metastases treated with whole-brain radiation therapy. EXAM: MRI HEAD WITHOUT AND WITH CONTRAST TECHNIQUE: Multiplanar, multiecho pulse sequences of the brain and surrounding structures were obtained without and with intravenous contrast. CONTRAST:  31mL GADAVIST GADOBUTROL 1 MMOL/ML IV SOLN COMPARISON:  03/24/2020 FINDINGS: Brain: A 3 mm enhancing focus in the paramedian left parietal lobe has slightly enlarged (series 11, image 212, previously 2 mm). A 2 mm focus of enhancement in the left parieto-occipital white matter is new (series 1100, image 196). There are 6 other punctate foci of enhancement scattered in both cerebral hemispheres which are more conspicuous than on the prior study but all correspond to previously treated metastases and with the better visualization today attributed to differences in technique. There is new abnormal leptomeningeal enhancement within multiple right parietal, right occipital, and high right frontal sulci. There are 2 or 3 punctate foci of cortical/subcortical trace diffusion weighted signal hyperintensity in the right parietal lobe without definite reduced ADC. Patchy T2 hyperintensities in the cerebral white matter bilaterally have slightly progressed  and are nonspecific but likely reflect a combination of chronic small vessel ischemia and post treatment changes. There is mild cerebral atrophy. A partially empty sella is unchanged. 6 mm enhancing extra-axial masses over the left frontal and right frontoparietal convexities are unchanged and consistent with meningiomas (series 1100, images 245 and 250). Vascular: Major intracranial vascular flow voids are preserved. Skull and upper cervical spine: No suspicious marrow lesion. Sinuses/Orbits: Prior functional endoscopic sinus surgery. Mild scattered mucosal thickening in the paranasal sinuses. Trace bilateral mastoid fluid. Other: None. IMPRESSION: 1. 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 Infection/meningitis could have a similar appearance but there are no reported symptoms to suggest this. While a couple of punctate subacute infarcts are questioned in the right parietal lobe, there is no large area of ischemia to suggest that this instead represents benign vascular enhancement. 2. New 2 mm enhancing lesion in the left parieto-occipital white matter suspicious for a metastasis. 3. Minimally increased size of a treated left parietal metastasis. 4. Additional punctate foci of cerebral enhancement bilaterally correspond to previously treated metastases. 5. Unchanged small left frontal and right frontoparietal meningiomas. Electronically Signed   By: Logan Bores M.D.   On: 06/24/2020 15:26   DG HIP UNILAT WITH PELVIS 2-3 VIEWS RIGHT  Result Date: 06/14/2020 CLINICAL DATA:  Right hip pain.  Right thigh pain. EXAM: DG HIP (WITH OR WITHOUT PELVIS) 2-3V RIGHT; RIGHT FEMUR 2 VIEWS COMPARISON:  CT dated April 18, 2020. FINDINGS: There are mild degenerative changes of both hips. There is no acute displaced fracture or dislocation. There are calcifications projecting over the patient's pelvis that are favored to represent phleboliths. Vascular  calcifications are noted.  There is no acute osseous abnormality involving the patient's right femur. IMPRESSION: Mild degenerative changes of both hips. No acute displaced fracture or dislocation involving the patient's right femur. Electronically Signed   By: Katherine Mantle M.D.   On: 06/14/2020 23:21   DG FEMUR, MIN 2 VIEWS RIGHT  Result Date: 06/14/2020 CLINICAL DATA:  Right hip pain.  Right thigh pain. EXAM: DG HIP (WITH OR WITHOUT PELVIS) 2-3V RIGHT; RIGHT FEMUR 2 VIEWS COMPARISON:  CT dated April 18, 2020. FINDINGS: There are mild degenerative changes of both hips. There is no acute displaced fracture or dislocation. There are calcifications projecting over the patient's pelvis that are favored to represent phleboliths. Vascular calcifications are noted. There is no acute osseous abnormality involving the patient's right femur. IMPRESSION: Mild degenerative changes of both hips. No acute displaced fracture or dislocation involving the patient's right femur. Electronically Signed   By: Katherine Mantle M.D.   On: 06/14/2020 23:21     ASSESSMENT:  1. Metastatic adenocarcinoma of the left lung with multiple brain mets: -Guardant 360 with EGFR exon 19 deletion. Patient never smoker. -Osimertinib 80 mg daily started on 07/28/2018, dose reduced due to weight loss and decreased appetite in July 2020. -CT chest on 11/25/2019 shows stable disease with no evidence of recurrence or metastasis. -CT chest, abdomen and pelvis on 04/18/2020 with no findings of recurrent malignancy. Prior radiation therapy changes. Stable trace left pleural effusion.  2. Brain metastasis: -20 subcentimeter brain lesions and diagnosis. -Whole brain RT completed on 07/29/2018. -MRI of the brain with and without contrast on 09/12/2019 showed increased conspicuity of a 3 mm focus of enhancement at site of treated anterior left temporal lobe lesion. Increased conspicuity is related to improved imaging quality on the  present exam. No new intracranial metastasis. -MRI of the brain with and without contrast on 12/21/2019 with no evidence of new or progressive disease. Minimal punctate enhancement remains visible at the site of previously treated left temporal lesion. 4 mm meningioma at the right frontoparietal vertex and 8 mm meningioma at the left anterior cranial fossa convexity are unchanged without mass-effect upon the brain. 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  New 2 mm enhancing lesion in the left.  Occipital white matter suspicious for meta stasis.  Minimally increased size of the treated left parietal metastasis.  Additional punctate foci of cerebral enhancement bilaterally corresponding to previously treated mets.  Matt, I have seen our mutual patient today as the ER contacted me over the weekend.   PLAN:  1. Metastatic adenocarcinoma of the left lung with multiple brain mets: -Continue osimertinib 40 mg daily. -Based on MRI findings, recommend restaging PET CT scan.  2. Brain metastasis: -Patient developed intermittent left upper extremity weakness, lasting less than 5 minutes, once daily for the last 1 week.  She was also evaluated in the ER on 06/25/2020.  ER has contacted me about the new findings and asked me to see her. -MRI of the brain from 06/16/2020 reviewed. -New right cerebral hemispheric leptomeningeal enhancement concerning for metastatic disease.  New 2 mm enhancing lesion in the left parieto-occipital white matter suspicious for metastasis.  Minimally increased size of treated left parietal metastasis. -Recommend follow-up with Dr. Kathrynn Running on Wednesday.  3. Recurrent PE: -Continue Lovenox indefinitely.  No bleeding issues.  4. Chronic cough: -She has occasional cough which is stable.  No medication required.   Orders placed this  encounter:   Orders Placed This Encounter  Procedures  . NM PET Image Restag (PS) Skull Base To Thigh     Derek Jack, MD [place] [phone]   I, Milinda Antis, am acting as a scribe for Dr. Sanda Linger.  I, Derek Jack MD, have reviewed the above documentation for accuracy and completeness, and I agree with the above.

## 2020-06-29 ENCOUNTER — Ambulatory Visit
Admission: RE | Admit: 2020-06-29 | Discharge: 2020-06-29 | Disposition: A | Discharge status: Home / Self Care | Payer: Medicare HMO | Source: Ambulatory Visit | Attending: Urology | Admitting: Urology

## 2020-06-29 DIAGNOSIS — C7931 Secondary malignant neoplasm of brain: Secondary | ICD-10-CM

## 2020-06-29 DIAGNOSIS — C3432 Malignant neoplasm of lower lobe, left bronchus or lung: Secondary | ICD-10-CM | POA: Diagnosis not present

## 2020-06-29 NOTE — Progress Notes (Signed)
Radiation Oncology         [phone] ________________________________  Name: Kaitlin [name] MRN: [id number]  Date: 06/29/2020  DOB: 09-07-1950  Post Treatment Note  CC: [name], MD  [name], MD  Diagnosis:   70 y.o. female with metastatic NSCLC of the left lower lobe lung with significant mediastinal/hilar lymph node involvement and brain metastases.  Interval Since Last Radiation:  1 year, 11 months 07/16/2018 - 07/29/2018:   1. The left lung was treated to 30 Gy in 10 fractions of 3 Gy. 2. The whole brain was treated to 30 Gy in 10 fractions of 3 Gy.  Narrative:  I spoke with the patient to conduct her routine scheduled 3 month follow up visit to review results of her recent follow up MRI brain scan via telephone to spare the patient unnecessary potential exposure in the healthcare setting during the current COVID-19 pandemic.   In summary, she initially presented to the [facility] ED with productive cough, shortness of breath, and right-sided chest pain on 04/24/2018 ongoing for 2 days prior. A CTA chest on admission revealed a large right pulmonary embolus and pleural effusion, as well as a left lower lobe mass measuring 3.8 x 3.7 x 2.1 cm, associated with hilar and mediastinal adenopathy highly suspicious for a pulmonary neoplasm. She met with Geraldo Pitter, NP in pulmonology on 05/05/2018. The initial plan was to place the patient on oral anticoagulation for PE for at least 3 weeks and then discontinue for 48 hours prior to obtaining CT guided biopsy, however, the patient was not comfortable coming off anticoagulation 48 hours prior to procedure d/t associated risks, so the biopsy was postponed, with plans to have the pre-admitted for heparin bridging prior to biopsy.   PET/CT scan was performed on 05/14/2018, which confirmed ahypermetabolic left lower lobe lung mass measuring 3.7 cm, SUV of 11; hypermetabolic bilateral hilar adenopathy and subcarinal adenopathy;thickened and  irregular appearance of bilateral adrenal glands with increased SUV; right lower lobe airspace disease with hypermetabolic rim and a hypermetabolic nodule medial to the main airspace disease, whichmay represent evolving infection/inflammation.   She met with Dr. Delton Coombes on 05/19/2018 and he recommended a brain MRI to complete her disease staging.  This was performed on 05/30/2018, and revealed at least twenty subcentimeter enhancing brain lesions consistent withmetastases.   She was admitted to the hospital on 06/16/2018 for heparin bridging and underwent lower left lung lobe biopsy on 06/18/2018 with pathology revealing adenocarcinoma.  Immunohistochemistry was positive with TTF-1, Napsin A and cytokeratin 7 consistent with primary lung adenocarcinoma.  Unfortunately, there was not sufficient tissue for molecular testing.  She was scheduled for an outpatient consult visit in our office on 07/11/18 but unfortunately, she experienced worsening shortness of breath, cough and feeling poorly which prompted re-admission to [facility] on 07/06/2018 and treatment for postobstructive pneumonia and mild sepsis. A repeat chest CT on 07/10/18 revealed progression of pulmonary emboli bilaterally in the lungs as well as abnormal soft tissue in the supraclavicular regions, suspicious for progression of metastatic disease. A follow up head and neck ultrasound confirmed findings most compatible with metastatic lymphadenopathy. She had ultrasound-guided biopsy of right neck mass/supraclavicular lymphadenopathy for molecular testing on 07/11/18 to help guide systemic treatment options.  This was consistent with metastatic adenocarcinoma. Guardant 360 showed EGFR exon 19 deletion (EGFR [id number]).  We consulted with the patient during her hospitalization to discuss the options for palliative brain and chest radiation which she was in agreement  Radiation Oncology         [phone] ________________________________  Name: Kaitlin [name] MRN: [id number]  Date: 06/29/2020  DOB: 09-07-1950  Post Treatment Note  CC: [name], MD  [name], MD  Diagnosis:   70 y.o. female with metastatic NSCLC of the left lower lobe lung with significant mediastinal/hilar lymph node involvement and brain metastases.  Interval Since Last Radiation:  1 year, 11 months 07/16/2018 - 07/29/2018:   1. The left lung was treated to 30 Gy in 10 fractions of 3 Gy. 2. The whole brain was treated to 30 Gy in 10 fractions of 3 Gy.  Narrative:  I spoke with the patient to conduct her routine scheduled 3 month follow up visit to review results of her recent follow up MRI brain scan via telephone to spare the patient unnecessary potential exposure in the healthcare setting during the current COVID-19 pandemic.   In summary, she initially presented to the [facility] ED with productive cough, shortness of breath, and right-sided chest pain on 04/24/2018 ongoing for 2 days prior. A CTA chest on admission revealed a large right pulmonary embolus and pleural effusion, as well as a left lower lobe mass measuring 3.8 x 3.7 x 2.1 cm, associated with hilar and mediastinal adenopathy highly suspicious for a pulmonary neoplasm. She met with Geraldo Pitter, NP in pulmonology on 05/05/2018. The initial plan was to place the patient on oral anticoagulation for PE for at least 3 weeks and then discontinue for 48 hours prior to obtaining CT guided biopsy, however, the patient was not comfortable coming off anticoagulation 48 hours prior to procedure d/t associated risks, so the biopsy was postponed, with plans to have the pre-admitted for heparin bridging prior to biopsy.   PET/CT scan was performed on 05/14/2018, which confirmed ahypermetabolic left lower lobe lung mass measuring 3.7 cm, SUV of 11; hypermetabolic bilateral hilar adenopathy and subcarinal adenopathy;thickened and  irregular appearance of bilateral adrenal glands with increased SUV; right lower lobe airspace disease with hypermetabolic rim and a hypermetabolic nodule medial to the main airspace disease, whichmay represent evolving infection/inflammation.   She met with Dr. Delton Coombes on 05/19/2018 and he recommended a brain MRI to complete her disease staging.  This was performed on 05/30/2018, and revealed at least twenty subcentimeter enhancing brain lesions consistent withmetastases.   She was admitted to the hospital on 06/16/2018 for heparin bridging and underwent lower left lung lobe biopsy on 06/18/2018 with pathology revealing adenocarcinoma.  Immunohistochemistry was positive with TTF-1, Napsin A and cytokeratin 7 consistent with primary lung adenocarcinoma.  Unfortunately, there was not sufficient tissue for molecular testing.  She was scheduled for an outpatient consult visit in our office on 07/11/18 but unfortunately, she experienced worsening shortness of breath, cough and feeling poorly which prompted re-admission to [facility] on 07/06/2018 and treatment for postobstructive pneumonia and mild sepsis. A repeat chest CT on 07/10/18 revealed progression of pulmonary emboli bilaterally in the lungs as well as abnormal soft tissue in the supraclavicular regions, suspicious for progression of metastatic disease. A follow up head and neck ultrasound confirmed findings most compatible with metastatic lymphadenopathy. She had ultrasound-guided biopsy of right neck mass/supraclavicular lymphadenopathy for molecular testing on 07/11/18 to help guide systemic treatment options.  This was consistent with metastatic adenocarcinoma. Guardant 360 showed EGFR exon 19 deletion (EGFR [id number]).  We consulted with the patient during her hospitalization to discuss the options for palliative brain and chest radiation which she was in agreement  Radiation Oncology         [phone] ________________________________  Name: Kaitlin [name] MRN: [id number]  Date: 06/29/2020  DOB: 09-07-1950  Post Treatment Note  CC: [name], MD  [name], MD  Diagnosis:   70 y.o. female with metastatic NSCLC of the left lower lobe lung with significant mediastinal/hilar lymph node involvement and brain metastases.  Interval Since Last Radiation:  1 year, 11 months 07/16/2018 - 07/29/2018:   1. The left lung was treated to 30 Gy in 10 fractions of 3 Gy. 2. The whole brain was treated to 30 Gy in 10 fractions of 3 Gy.  Narrative:  I spoke with the patient to conduct her routine scheduled 3 month follow up visit to review results of her recent follow up MRI brain scan via telephone to spare the patient unnecessary potential exposure in the healthcare setting during the current COVID-19 pandemic.   In summary, she initially presented to the [facility] ED with productive cough, shortness of breath, and right-sided chest pain on 04/24/2018 ongoing for 2 days prior. A CTA chest on admission revealed a large right pulmonary embolus and pleural effusion, as well as a left lower lobe mass measuring 3.8 x 3.7 x 2.1 cm, associated with hilar and mediastinal adenopathy highly suspicious for a pulmonary neoplasm. She met with Geraldo Pitter, NP in pulmonology on 05/05/2018. The initial plan was to place the patient on oral anticoagulation for PE for at least 3 weeks and then discontinue for 48 hours prior to obtaining CT guided biopsy, however, the patient was not comfortable coming off anticoagulation 48 hours prior to procedure d/t associated risks, so the biopsy was postponed, with plans to have the pre-admitted for heparin bridging prior to biopsy.   PET/CT scan was performed on 05/14/2018, which confirmed ahypermetabolic left lower lobe lung mass measuring 3.7 cm, SUV of 11; hypermetabolic bilateral hilar adenopathy and subcarinal adenopathy;thickened and  irregular appearance of bilateral adrenal glands with increased SUV; right lower lobe airspace disease with hypermetabolic rim and a hypermetabolic nodule medial to the main airspace disease, whichmay represent evolving infection/inflammation.   She met with Dr. Delton Coombes on 05/19/2018 and he recommended a brain MRI to complete her disease staging.  This was performed on 05/30/2018, and revealed at least twenty subcentimeter enhancing brain lesions consistent withmetastases.   She was admitted to the hospital on 06/16/2018 for heparin bridging and underwent lower left lung lobe biopsy on 06/18/2018 with pathology revealing adenocarcinoma.  Immunohistochemistry was positive with TTF-1, Napsin A and cytokeratin 7 consistent with primary lung adenocarcinoma.  Unfortunately, there was not sufficient tissue for molecular testing.  She was scheduled for an outpatient consult visit in our office on 07/11/18 but unfortunately, she experienced worsening shortness of breath, cough and feeling poorly which prompted re-admission to [facility] on 07/06/2018 and treatment for postobstructive pneumonia and mild sepsis. A repeat chest CT on 07/10/18 revealed progression of pulmonary emboli bilaterally in the lungs as well as abnormal soft tissue in the supraclavicular regions, suspicious for progression of metastatic disease. A follow up head and neck ultrasound confirmed findings most compatible with metastatic lymphadenopathy. She had ultrasound-guided biopsy of right neck mass/supraclavicular lymphadenopathy for molecular testing on 07/11/18 to help guide systemic treatment options.  This was consistent with metastatic adenocarcinoma. Guardant 360 showed EGFR exon 19 deletion (EGFR [id number]).  We consulted with the patient during her hospitalization to discuss the options for palliative brain and chest radiation which she was in agreement  Radiation Oncology         [phone] ________________________________  Name: Kaitlin [name] MRN: [id number]  Date: 06/29/2020  DOB: 09-07-1950  Post Treatment Note  CC: [name], MD  [name], MD  Diagnosis:   70 y.o. female with metastatic NSCLC of the left lower lobe lung with significant mediastinal/hilar lymph node involvement and brain metastases.  Interval Since Last Radiation:  1 year, 11 months 07/16/2018 - 07/29/2018:   1. The left lung was treated to 30 Gy in 10 fractions of 3 Gy. 2. The whole brain was treated to 30 Gy in 10 fractions of 3 Gy.  Narrative:  I spoke with the patient to conduct her routine scheduled 3 month follow up visit to review results of her recent follow up MRI brain scan via telephone to spare the patient unnecessary potential exposure in the healthcare setting during the current COVID-19 pandemic.   In summary, she initially presented to the [facility] ED with productive cough, shortness of breath, and right-sided chest pain on 04/24/2018 ongoing for 2 days prior. A CTA chest on admission revealed a large right pulmonary embolus and pleural effusion, as well as a left lower lobe mass measuring 3.8 x 3.7 x 2.1 cm, associated with hilar and mediastinal adenopathy highly suspicious for a pulmonary neoplasm. She met with Geraldo Pitter, NP in pulmonology on 05/05/2018. The initial plan was to place the patient on oral anticoagulation for PE for at least 3 weeks and then discontinue for 48 hours prior to obtaining CT guided biopsy, however, the patient was not comfortable coming off anticoagulation 48 hours prior to procedure d/t associated risks, so the biopsy was postponed, with plans to have the pre-admitted for heparin bridging prior to biopsy.   PET/CT scan was performed on 05/14/2018, which confirmed ahypermetabolic left lower lobe lung mass measuring 3.7 cm, SUV of 11; hypermetabolic bilateral hilar adenopathy and subcarinal adenopathy;thickened and  irregular appearance of bilateral adrenal glands with increased SUV; right lower lobe airspace disease with hypermetabolic rim and a hypermetabolic nodule medial to the main airspace disease, whichmay represent evolving infection/inflammation.   She met with Dr. Delton Coombes on 05/19/2018 and he recommended a brain MRI to complete her disease staging.  This was performed on 05/30/2018, and revealed at least twenty subcentimeter enhancing brain lesions consistent withmetastases.   She was admitted to the hospital on 06/16/2018 for heparin bridging and underwent lower left lung lobe biopsy on 06/18/2018 with pathology revealing adenocarcinoma.  Immunohistochemistry was positive with TTF-1, Napsin A and cytokeratin 7 consistent with primary lung adenocarcinoma.  Unfortunately, there was not sufficient tissue for molecular testing.  She was scheduled for an outpatient consult visit in our office on 07/11/18 but unfortunately, she experienced worsening shortness of breath, cough and feeling poorly which prompted re-admission to [facility] on 07/06/2018 and treatment for postobstructive pneumonia and mild sepsis. A repeat chest CT on 07/10/18 revealed progression of pulmonary emboli bilaterally in the lungs as well as abnormal soft tissue in the supraclavicular regions, suspicious for progression of metastatic disease. A follow up head and neck ultrasound confirmed findings most compatible with metastatic lymphadenopathy. She had ultrasound-guided biopsy of right neck mass/supraclavicular lymphadenopathy for molecular testing on 07/11/18 to help guide systemic treatment options.  This was consistent with metastatic adenocarcinoma. Guardant 360 showed EGFR exon 19 deletion (EGFR [id number]).  We consulted with the patient during her hospitalization to discuss the options for palliative brain and chest radiation which she was in agreement  Radiation Oncology         [phone] ________________________________  Name: Kaitlin [name] MRN: [id number]  Date: 06/29/2020  DOB: 09-07-1950  Post Treatment Note  CC: [name], MD  [name], MD  Diagnosis:   70 y.o. female with metastatic NSCLC of the left lower lobe lung with significant mediastinal/hilar lymph node involvement and brain metastases.  Interval Since Last Radiation:  1 year, 11 months 07/16/2018 - 07/29/2018:   1. The left lung was treated to 30 Gy in 10 fractions of 3 Gy. 2. The whole brain was treated to 30 Gy in 10 fractions of 3 Gy.  Narrative:  I spoke with the patient to conduct her routine scheduled 3 month follow up visit to review results of her recent follow up MRI brain scan via telephone to spare the patient unnecessary potential exposure in the healthcare setting during the current COVID-19 pandemic.   In summary, she initially presented to the [facility] ED with productive cough, shortness of breath, and right-sided chest pain on 04/24/2018 ongoing for 2 days prior. A CTA chest on admission revealed a large right pulmonary embolus and pleural effusion, as well as a left lower lobe mass measuring 3.8 x 3.7 x 2.1 cm, associated with hilar and mediastinal adenopathy highly suspicious for a pulmonary neoplasm. She met with Geraldo Pitter, NP in pulmonology on 05/05/2018. The initial plan was to place the patient on oral anticoagulation for PE for at least 3 weeks and then discontinue for 48 hours prior to obtaining CT guided biopsy, however, the patient was not comfortable coming off anticoagulation 48 hours prior to procedure d/t associated risks, so the biopsy was postponed, with plans to have the pre-admitted for heparin bridging prior to biopsy.   PET/CT scan was performed on 05/14/2018, which confirmed ahypermetabolic left lower lobe lung mass measuring 3.7 cm, SUV of 11; hypermetabolic bilateral hilar adenopathy and subcarinal adenopathy;thickened and  irregular appearance of bilateral adrenal glands with increased SUV; right lower lobe airspace disease with hypermetabolic rim and a hypermetabolic nodule medial to the main airspace disease, whichmay represent evolving infection/inflammation.   She met with Dr. Delton Coombes on 05/19/2018 and he recommended a brain MRI to complete her disease staging.  This was performed on 05/30/2018, and revealed at least twenty subcentimeter enhancing brain lesions consistent withmetastases.   She was admitted to the hospital on 06/16/2018 for heparin bridging and underwent lower left lung lobe biopsy on 06/18/2018 with pathology revealing adenocarcinoma.  Immunohistochemistry was positive with TTF-1, Napsin A and cytokeratin 7 consistent with primary lung adenocarcinoma.  Unfortunately, there was not sufficient tissue for molecular testing.  She was scheduled for an outpatient consult visit in our office on 07/11/18 but unfortunately, she experienced worsening shortness of breath, cough and feeling poorly which prompted re-admission to [facility] on 07/06/2018 and treatment for postobstructive pneumonia and mild sepsis. A repeat chest CT on 07/10/18 revealed progression of pulmonary emboli bilaterally in the lungs as well as abnormal soft tissue in the supraclavicular regions, suspicious for progression of metastatic disease. A follow up head and neck ultrasound confirmed findings most compatible with metastatic lymphadenopathy. She had ultrasound-guided biopsy of right neck mass/supraclavicular lymphadenopathy for molecular testing on 07/11/18 to help guide systemic treatment options.  This was consistent with metastatic adenocarcinoma. Guardant 360 showed EGFR exon 19 deletion (EGFR [id number]).  We consulted with the patient during her hospitalization to discuss the options for palliative brain and chest radiation which she was in agreement  Radiation Oncology         [phone] ________________________________  Name: Kaitlin [name] MRN: [id number]  Date: 06/29/2020  DOB: 09-07-1950  Post Treatment Note  CC: [name], MD  [name], MD  Diagnosis:   70 y.o. female with metastatic NSCLC of the left lower lobe lung with significant mediastinal/hilar lymph node involvement and brain metastases.  Interval Since Last Radiation:  1 year, 11 months 07/16/2018 - 07/29/2018:   1. The left lung was treated to 30 Gy in 10 fractions of 3 Gy. 2. The whole brain was treated to 30 Gy in 10 fractions of 3 Gy.  Narrative:  I spoke with the patient to conduct her routine scheduled 3 month follow up visit to review results of her recent follow up MRI brain scan via telephone to spare the patient unnecessary potential exposure in the healthcare setting during the current COVID-19 pandemic.   In summary, she initially presented to the [facility] ED with productive cough, shortness of breath, and right-sided chest pain on 04/24/2018 ongoing for 2 days prior. A CTA chest on admission revealed a large right pulmonary embolus and pleural effusion, as well as a left lower lobe mass measuring 3.8 x 3.7 x 2.1 cm, associated with hilar and mediastinal adenopathy highly suspicious for a pulmonary neoplasm. She met with Geraldo Pitter, NP in pulmonology on 05/05/2018. The initial plan was to place the patient on oral anticoagulation for PE for at least 3 weeks and then discontinue for 48 hours prior to obtaining CT guided biopsy, however, the patient was not comfortable coming off anticoagulation 48 hours prior to procedure d/t associated risks, so the biopsy was postponed, with plans to have the pre-admitted for heparin bridging prior to biopsy.   PET/CT scan was performed on 05/14/2018, which confirmed ahypermetabolic left lower lobe lung mass measuring 3.7 cm, SUV of 11; hypermetabolic bilateral hilar adenopathy and subcarinal adenopathy;thickened and  irregular appearance of bilateral adrenal glands with increased SUV; right lower lobe airspace disease with hypermetabolic rim and a hypermetabolic nodule medial to the main airspace disease, whichmay represent evolving infection/inflammation.   She met with Dr. Delton Coombes on 05/19/2018 and he recommended a brain MRI to complete her disease staging.  This was performed on 05/30/2018, and revealed at least twenty subcentimeter enhancing brain lesions consistent withmetastases.   She was admitted to the hospital on 06/16/2018 for heparin bridging and underwent lower left lung lobe biopsy on 06/18/2018 with pathology revealing adenocarcinoma.  Immunohistochemistry was positive with TTF-1, Napsin A and cytokeratin 7 consistent with primary lung adenocarcinoma.  Unfortunately, there was not sufficient tissue for molecular testing.  She was scheduled for an outpatient consult visit in our office on 07/11/18 but unfortunately, she experienced worsening shortness of breath, cough and feeling poorly which prompted re-admission to [facility] on 07/06/2018 and treatment for postobstructive pneumonia and mild sepsis. A repeat chest CT on 07/10/18 revealed progression of pulmonary emboli bilaterally in the lungs as well as abnormal soft tissue in the supraclavicular regions, suspicious for progression of metastatic disease. A follow up head and neck ultrasound confirmed findings most compatible with metastatic lymphadenopathy. She had ultrasound-guided biopsy of right neck mass/supraclavicular lymphadenopathy for molecular testing on 07/11/18 to help guide systemic treatment options.  This was consistent with metastatic adenocarcinoma. Guardant 360 showed EGFR exon 19 deletion (EGFR [id number]).  We consulted with the patient during her hospitalization to discuss the options for palliative brain and chest radiation which she was in agreement

## 2020-07-01 ENCOUNTER — Ambulatory Visit
Admission: RE | Admit: 2020-07-01 | Discharge: 2020-07-01 | Disposition: A | Discharge status: Home / Self Care | Payer: Medicare HMO | Source: Ambulatory Visit | Attending: Radiation Oncology | Admitting: Radiation Oncology

## 2020-07-01 DIAGNOSIS — C7931 Secondary malignant neoplasm of brain: Secondary | ICD-10-CM | POA: Insufficient documentation

## 2020-07-01 DIAGNOSIS — Z510 Encounter for antineoplastic radiation therapy: Secondary | ICD-10-CM | POA: Insufficient documentation

## 2020-07-01 NOTE — Progress Notes (Incomplete)
Radiation Oncology         [phone] ________________________________  Name: Kaitlin [name] MRN: [id number]  Date: 07/01/2020  DOB: Oct 22, 1950  SIMULATION AND TREATMENT PLANNING NOTE  No diagnosis found.  DIAGNOSIS:  70 y.o.female with metastatic NSCLC of the left lower lobe lung with significant mediastinal/hilar lymph node involvement and brain metastases.  NARRATIVE:  The patient was brought to the CT Simulation planning suite.  Identity was confirmed.  All relevant records and images related to the planned course of therapy were reviewed.  The patient freely provided informed written consent to proceed with treatment after reviewing the details related to the planned course of therapy. The consent form was witnessed and verified by the simulation staff.  Then, the patient was set-up in a stable reproducible  supine position for radiation therapy.  CT images were obtained.  Surface markings were placed.  The CT images were loaded into the planning software.  Then the target and avoidance structures were contoured.  Treatment planning then occurred.  The radiation prescription was entered and confirmed.  Then, I designed and supervised the construction of a total of 3 medically necessary complex treatment devices, including a custom made thermoplastic mask used for immobilization and two complex multileaf collimators to cover the entire intracranial contents, while shielding the eyes and face.  Each [facility] is independently created to account for beam divergence.  The right and left lateral fields will be treated with 6 MV X-rays.  I have requested : Isodose Plan.    PLAN:  The whole brain will be treated to 35 Gy in 14 fractions.  ________________________________  Artist Pais Kathrynn Running, M.D.  This document serves as a record of services personally performed by Margaretmary Dys, MD. It was created on his behalf by Mickie Bail, a trained medical scribe. The creation of this record is based on  the scribe's personal observations and the provider's statements to them. This document has been checked and approved by the attending provider.

## 2020-07-04 ENCOUNTER — Ambulatory Visit
Admission: RE | Admit: 2020-07-04 | Discharge: 2020-07-04 | Disposition: A | Discharge status: Home / Self Care | Payer: Medicare HMO | Source: Ambulatory Visit | Attending: Radiation Oncology | Admitting: Radiation Oncology

## 2020-07-04 ENCOUNTER — Other Ambulatory Visit: Payer: Self-pay

## 2020-07-04 DIAGNOSIS — C7931 Secondary malignant neoplasm of brain: Secondary | ICD-10-CM

## 2020-07-04 DIAGNOSIS — C3432 Malignant neoplasm of lower lobe, left bronchus or lung: Secondary | ICD-10-CM | POA: Diagnosis not present

## 2020-07-04 DIAGNOSIS — Z510 Encounter for antineoplastic radiation therapy: Secondary | ICD-10-CM | POA: Diagnosis not present

## 2020-07-04 NOTE — Progress Notes (Signed)
Radiation Oncology         (336) [phone] ________________________________  Name: Kaitlin [name] MRN: [id number]  Date: 07/04/2020  DOB: 1951/04/27  SIMULATION AND TREATMENT PLANNING NOTE    ICD-10-CM   1. Brain metastases (HCC)  C79.31     DIAGNOSIS:  49 yowoman with metastatic NSCLC of the left lower lobe lung with recurrent brain metastases.  NARRATIVE:  The patient was brought to the CT Simulation planning suite.  Identity was confirmed.  All relevant records and images related to the planned course of therapy were reviewed.  The patient freely provided informed written consent to proceed with treatment after reviewing the details related to the planned course of therapy. The consent form was witnessed and verified by the simulation staff.  Then, the patient was set-up in a stable reproducible  supine position for radiation therapy.  CT images were obtained.  Surface markings were placed.  The CT images were loaded into the planning software.  Then the target and avoidance structures were contoured.  Treatment planning then occurred.  The radiation prescription was entered and confirmed.  Then, I designed and supervised the construction of a total of 3 medically necessary complex treatment devices, including a custom made thermoplastic mask used for immobilization and two complex multileaf collimators to cover the entire intracranial contents, while shielding the eyes and face.  Each [facility] is independently created to account for beam divergence.  The right and left lateral fields will be treated with 6 MV X-rays.  I have requested : Isodose Plan.    SPECIAL TREATMENT PROCEDURE: The planned course of therapy using radiation constitutes a special treatment procedure. Special care is required in the management of this patient for the following reasons.  I have requested : This treatment constitutes a Special Treatment Procedure for the following reason: [ Retreatment in a previously radiated area  requiring careful monitoring of increased risk of toxicity due to overlap of previous treatment.  The special nature of the planned course of radiotherapy will require increased physician supervision and oversight to ensure patient's safety with optimal treatment outcomes.  PLAN:  The whole brain will be treated to 30 Gy in 15 fractions.  ________________________________  Artist Pais Kathrynn Running, M.D.  This document serves as a record of services personally performed by Margaretmary Dys, MD. It was created on his behalf by Mickie Bail, a trained medical scribe. The creation of this record is based on the scribe's personal observations and the provider's statements to them. This document has been checked and approved by the attending provider.

## 2020-07-05 DIAGNOSIS — Z510 Encounter for antineoplastic radiation therapy: Secondary | ICD-10-CM | POA: Diagnosis not present

## 2020-07-05 DIAGNOSIS — C3432 Malignant neoplasm of lower lobe, left bronchus or lung: Secondary | ICD-10-CM | POA: Diagnosis not present

## 2020-07-05 DIAGNOSIS — C7931 Secondary malignant neoplasm of brain: Secondary | ICD-10-CM | POA: Diagnosis not present

## 2020-07-07 ENCOUNTER — Other Ambulatory Visit: Payer: Self-pay

## 2020-07-07 ENCOUNTER — Encounter (HOSPITAL_COMMUNITY)
Admission: RE | Admit: 2020-07-07 | Discharge: 2020-07-07 | Disposition: A | Discharge status: Home / Self Care | Payer: Medicare HMO | Source: Ambulatory Visit | Attending: Hematology | Admitting: Hematology

## 2020-07-07 DIAGNOSIS — M47815 Spondylosis without myelopathy or radiculopathy, thoracolumbar region: Secondary | ICD-10-CM | POA: Diagnosis not present

## 2020-07-07 DIAGNOSIS — I2510 Atherosclerotic heart disease of native coronary artery without angina pectoris: Secondary | ICD-10-CM | POA: Insufficient documentation

## 2020-07-07 DIAGNOSIS — C7931 Secondary malignant neoplasm of brain: Secondary | ICD-10-CM | POA: Insufficient documentation

## 2020-07-07 DIAGNOSIS — I700 Atherosclerosis of aorta: Secondary | ICD-10-CM | POA: Diagnosis not present

## 2020-07-07 DIAGNOSIS — C3490 Malignant neoplasm of unspecified part of unspecified bronchus or lung: Secondary | ICD-10-CM | POA: Diagnosis not present

## 2020-07-07 DIAGNOSIS — C3432 Malignant neoplasm of lower lobe, left bronchus or lung: Secondary | ICD-10-CM | POA: Diagnosis not present

## 2020-07-07 DIAGNOSIS — C3492 Malignant neoplasm of unspecified part of left bronchus or lung: Secondary | ICD-10-CM | POA: Diagnosis not present

## 2020-07-07 LAB — GLUCOSE, CAPILLARY: Glucose-Capillary: 147 mg/dL — ABNORMAL HIGH (ref 70–99)

## 2020-07-07 MED ORDER — FLUDEOXYGLUCOSE F - 18 (FDG) INJECTION
10.2000 | Freq: Once | INTRAVENOUS | Status: AC | PRN
  Administered 2020-07-07: 9.8 via INTRAVENOUS

## 2020-07-11 ENCOUNTER — Ambulatory Visit
Admission: RE | Admit: 2020-07-11 | Discharge: 2020-07-11 | Disposition: A | Discharge status: Home / Self Care | Payer: Medicare HMO | Source: Ambulatory Visit | Attending: Radiation Oncology | Admitting: Radiation Oncology

## 2020-07-11 ENCOUNTER — Other Ambulatory Visit: Payer: Self-pay

## 2020-07-11 DIAGNOSIS — E119 Type 2 diabetes mellitus without complications: Secondary | ICD-10-CM | POA: Diagnosis not present

## 2020-07-11 DIAGNOSIS — C3432 Malignant neoplasm of lower lobe, left bronchus or lung: Secondary | ICD-10-CM | POA: Diagnosis not present

## 2020-07-11 DIAGNOSIS — C7931 Secondary malignant neoplasm of brain: Secondary | ICD-10-CM | POA: Diagnosis not present

## 2020-07-11 DIAGNOSIS — Z923 Personal history of irradiation: Secondary | ICD-10-CM | POA: Diagnosis not present

## 2020-07-11 DIAGNOSIS — C3492 Malignant neoplasm of unspecified part of left bronchus or lung: Secondary | ICD-10-CM | POA: Diagnosis not present

## 2020-07-11 DIAGNOSIS — Z510 Encounter for antineoplastic radiation therapy: Secondary | ICD-10-CM | POA: Diagnosis not present

## 2020-07-11 DIAGNOSIS — Z86718 Personal history of other venous thrombosis and embolism: Secondary | ICD-10-CM | POA: Diagnosis not present

## 2020-07-11 DIAGNOSIS — I10 Essential (primary) hypertension: Secondary | ICD-10-CM | POA: Diagnosis not present

## 2020-07-11 DIAGNOSIS — R053 Chronic cough: Secondary | ICD-10-CM | POA: Diagnosis not present

## 2020-07-11 DIAGNOSIS — Z86711 Personal history of pulmonary embolism: Secondary | ICD-10-CM | POA: Diagnosis not present

## 2020-07-11 DIAGNOSIS — R531 Weakness: Secondary | ICD-10-CM | POA: Diagnosis not present

## 2020-07-12 ENCOUNTER — Ambulatory Visit
Admission: RE | Admit: 2020-07-12 | Discharge: 2020-07-12 | Disposition: A | Discharge status: Home / Self Care | Payer: Medicare HMO | Source: Ambulatory Visit | Attending: Radiation Oncology | Admitting: Radiation Oncology

## 2020-07-12 ENCOUNTER — Inpatient Hospital Stay (HOSPITAL_BASED_OUTPATIENT_CLINIC_OR_DEPARTMENT_OTHER): Payer: Medicare HMO | Admitting: Hematology

## 2020-07-12 VITALS — BP 142/85 | HR 99 | Temp 97.1°F | Resp 17 | Wt 188.2 lb

## 2020-07-12 DIAGNOSIS — C7931 Secondary malignant neoplasm of brain: Secondary | ICD-10-CM | POA: Diagnosis not present

## 2020-07-12 DIAGNOSIS — R531 Weakness: Secondary | ICD-10-CM | POA: Diagnosis not present

## 2020-07-12 DIAGNOSIS — Z86711 Personal history of pulmonary embolism: Secondary | ICD-10-CM | POA: Diagnosis not present

## 2020-07-12 DIAGNOSIS — C3492 Malignant neoplasm of unspecified part of left bronchus or lung: Secondary | ICD-10-CM | POA: Diagnosis not present

## 2020-07-12 DIAGNOSIS — R053 Chronic cough: Secondary | ICD-10-CM | POA: Diagnosis not present

## 2020-07-12 DIAGNOSIS — I10 Essential (primary) hypertension: Secondary | ICD-10-CM | POA: Diagnosis not present

## 2020-07-12 DIAGNOSIS — Z86718 Personal history of other venous thrombosis and embolism: Secondary | ICD-10-CM | POA: Diagnosis not present

## 2020-07-12 DIAGNOSIS — E119 Type 2 diabetes mellitus without complications: Secondary | ICD-10-CM | POA: Diagnosis not present

## 2020-07-12 DIAGNOSIS — Z510 Encounter for antineoplastic radiation therapy: Secondary | ICD-10-CM | POA: Diagnosis not present

## 2020-07-12 DIAGNOSIS — Z923 Personal history of irradiation: Secondary | ICD-10-CM | POA: Diagnosis not present

## 2020-07-12 NOTE — Progress Notes (Signed)
Kaitlin [name], Kaitlin [name]   CLINIC:  Medical Oncology/Hematology  PCP:  [name], MD [address] / [place] [address] [phone]   REASON FOR VISIT:  Follow-up for metastatic left lung cancer  PRIOR THERAPY:  1. Left lung 30 Gy in 10 fractions from 07/16/2018 to 07/29/2018. 2. Whole brain 30 Gy in 10 fractions from 07/16/2018 to 07/29/2018.  NGS Results: Guardant 360 EGFR exon 19 deletion  CURRENT THERAPY: XRT to brain mets 30 Gy in 15 fractions; Tagrisso 40 mg daily  BRIEF ONCOLOGIC HISTORY:  Oncology History   No history exists.    CANCER STAGING: Cancer Staging No matching staging information was found for the patient.  INTERVAL HISTORY:  Kaitlin [name], a 70 y.o. female, returns for routine follow-up of her metastatic left lung cancer. Kaitlin [name] was last seen on 06/28/2020.   Today she is accompanied by her daughter and he reports feeling okay. She is doing radiation Monday through Friday and started on 02/14 for 3 weeks. She is not able to walk now due to weakness on her entire left side and has to lift up her left leg with her arms, though she is able to move it, but needs assistance with going to the bathroom. Her cough has resolved and is getting Lovenox injections daily.   REVIEW OF SYSTEMS:  Review of Systems  Constitutional: Positive for appetite change (75%) and fatigue (50%).  Respiratory: Negative for cough.   Neurological: Positive for extremity weakness (L-sided weakness).  All other systems reviewed and are negative.   PAST MEDICAL/SURGICAL HISTORY:  Past Medical History:  Diagnosis Date  . Asthma   . Cancer ([place]) 04/21/2018   Lung Cancer   . Carpal tunnel syndrome   . Cataract    left eye  . Diabetes mellitus without complication ([place])   . DVT (deep venous thrombosis) ([place])   . Hypertension   . Pneumonia 04/21/2018  . Pulmonary embolism ([place]) 03/2018  . Sepsis [facility])    Past  Surgical History:  Procedure Laterality Date  . ABDOMINAL HYSTERECTOMY    . biopsy of right breast    . ESOPHAGOGASTRODUODENOSCOPY (EGD) WITH PROPOFOL N/A 09/12/2018   Procedure: ESOPHAGOGASTRODUODENOSCOPY (EGD) WITH PROPOFOL (scope in 1017, scope out 1032);  Surgeon: Virl Cagey, MD;  Location: [facility];  Service: General;  Laterality: N/A;  . FOOT SURGERY    . implant left eye    . INTRAOCULAR LENS INSERTION    . IR GASTROSTOMY TUBE MOD SED  12/05/2018  . PEG PLACEMENT N/A 09/12/2018   Procedure: PERCUTANEOUS ENDOSCOPIC GASTROSTOMY (PEG) PLACEMENT;  Surgeon: Virl Cagey, MD;  Location: [facility];  Service: General;  Laterality: N/A;    SOCIAL HISTORY:  Social History   Socioeconomic History  . Marital status: Widowed    Spouse name: Not on file  . Number of children: 4  . Years of education: 9  . Highest education level: High school graduate  Occupational History  . Not on file  Tobacco Use  . Smoking status: Never Smoker  . Smokeless tobacco: Never Used  Vaping Use  . Vaping Use: Never used  Substance and Sexual Activity  . Alcohol use: Not Currently    Comment: occ  . Drug use: No  . Sexual activity: Yes    Partners: Male    Birth control/protection: Surgical  Other Topics Concern  . 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   Social Determinants of Health   Financial Resource Strain: Not on file  Food Insecurity: Not on file  Transportation Needs: Not on file  Physical Activity: Not on file  Stress: Not on file  Social Connections: Not on file  Intimate Partner Violence: Not on file    FAMILY HISTORY:  Family History  Problem Relation Age of Onset  . Heart failure Mother   . Stroke Mother   . Heart attack Mother   . Kidney failure Other   .  Leukemia Father     CURRENT MEDICATIONS:  Current Outpatient Medications  Medication Sig Dispense Refill  . amLODipine (NORVASC) 10 MG tablet Take 10 mg by mouth daily.    Marland Kitchen amLODipine (NORVASC) 5 MG tablet Take 10 mg by mouth daily.    Marland Kitchen atorvastatin (LIPITOR) 20 MG tablet Take 20 mg by mouth daily.    . benzonatate (TESSALON) 100 MG capsule Take 1 capsule (100 mg total) by mouth 3 (three) times daily as needed for cough. 30 capsule 0  . Colchicine (MITIGARE) 0.6 MG CAPS Take 0.6 mg by mouth daily as needed. 15 capsule 3  . dexamethasone (DECADRON) 4 MG tablet Take 1 tablet (4 mg total) by mouth 2 (two) times daily. 20 tablet 0  . enoxaparin (LOVENOX) 150 MG/ML injection Inject 1 mL (150 mg total) into the skin daily. 0 mL   . FLOVENT HFA 44 MCG/ACT inhaler Inhale 2 puffs into the lungs daily.     Marland Kitchen HYDROcodone-homatropine (HYCODAN) 5-1.5 MG/5ML syrup Take 5 mLs by mouth every 6 (six) hours as needed for cough. 120 mL 0  . hydrocortisone (ANUSOL-HC) 2.5 % rectal cream     . latanoprost (XALATAN) 0.005 % ophthalmic solution Place 1 drop into both eyes in the morning and at bedtime.     . meclizine (ANTIVERT) 25 MG tablet Take 25 mg by mouth as needed.     . metoCLOPramide (REGLAN) 10 MG tablet Take 1 tablet (10 mg total) by mouth every 8 (eight) hours as needed for refractory nausea / vomiting. 30 tablet 0  . osimertinib mesylate (TAGRISSO) 40 MG tablet Take 1 tablet (40 mg total) by mouth daily. 30 tablet 2  . oxyCODONE-acetaminophen (PERCOCET/ROXICET) 5-325 MG tablet Take 1-2 tablets by mouth as needed.     . pantoprazole (PROTONIX) 40 MG tablet TAKE 1 TABLET BY MOUTH ONCE DAILY. 30 tablet 0  . potassium chloride (KLOR-CON) 10 MEQ tablet TAKE (2) TABLETS BY MOUTH TWICE DAILY. 120 tablet 3  . predniSONE (DELTASONE) 20 MG tablet Take 20 mg by mouth 2 (two) times daily.    . prochlorperazine (COMPAZINE) 10 MG tablet TAKE (1) TABLET BY MOUTH EVERY SIX HOURS AS NEEDED FOR NAUSEA OR VOMITING. 30  tablet 10  . triamcinolone cream (KENALOG) 0.1 % Apply 1 application topically as needed.    . zolpidem (AMBIEN CR) 6.25 MG CR tablet Take 1 tablet (6.25 mg total) by mouth at bedtime as needed for sleep. 30 tablet 0  . albuterol (PROVENTIL HFA;VENTOLIN HFA) 108 (90 BASE) MCG/ACT inhaler Inhale 2 puffs into the lungs every 6 (six) hours as needed for wheezing. (Patient not taking: Reported on 07/12/2020)    . albuterol (PROVENTIL) (2.5 MG/3ML) 0.083% nebulizer solution Take 3 mLs (2.5 mg  total) by nebulization every 6 (six) hours as needed for wheezing or shortness of breath. (Patient not taking: Reported on 07/12/2020) 75 mL 2   No current facility-administered medications for this visit.    ALLERGIES:  Allergies  Allergen Reactions  . Banana Anaphylaxis  . Aspirin Other (See Comments)    G.I. Upset     PHYSICAL EXAM:  Performance status (ECOG): 1 - Symptomatic but completely ambulatory  Vitals:   07/12/20 0916  BP: (!) 142/85  Pulse: 99  Resp: 17  Temp: (!) 97.1 F (36.2 C)  SpO2: 100%   Wt Readings from Last 3 Encounters:  07/12/20 188 lb 3.2 oz (85.4 kg)  06/28/20 197 lb 3.2 oz (89.4 kg)  06/25/20 200 lb (90.7 kg)   Physical Exam Neurological:     Motor: Weakness (3/5 in LUE and LLE; 5/5 in RUE and RLE) present.      LABORATORY DATA:  I have reviewed the labs as listed.  CBC Latest Ref Rng & Units 06/25/2020 04/18/2020 03/01/2020  WBC 4.0 - 10.5 K/uL 4.7 4.4 4.7  Hemoglobin 12.0 - 15.0 g/dL 15.0 15.4(H) 14.6  Hematocrit 36.0 - 46.0 % 46.4(H) 47.2(H) 45.4  Platelets 150 - 400 K/uL 223 233 208   CMP Latest Ref Rng & Units 06/25/2020 04/18/2020 03/01/2020  Glucose 70 - 99 mg/dL 97 76 73  BUN 8 - 23 mg/dL _0 Creatinine 0.44 - 1.00 mg/dL 1.12(H) 1.03(H) 1.28(H)  Sodium 135 - 145 mmol/L 137 139 137  Potassium 3.5 - 5.1 mmol/L 4.1 3.5 4.1  Chloride 98 - 111 mmol/L 104 104 101  CO2 22 - 32 mmol/L _1 Calcium 8.9 - 10.3 mg/dL 10.0 9.9 9.9  Total Protein 6.5  - 8.1 g/dL 7.7 7.7 7.4  Total Bilirubin 0.3 - 1.2 mg/dL 0.4 0.4 0.6  Alkaline Phos 38 - 126 U/L 68 77 77  AST 15 - 41 U/L _2 ALT 0 - 44 U/L _3 DIAGNOSTIC IMAGING:  I have independently reviewed the scans and discussed with the patient. CT Head Wo Contrast  Result Date: 06/25/2020 CLINICAL DATA:  70 year old female with neuro deficit, stroke suspected. MRI dated yesterday demonstrates findings concerning for leptomeningeal metastatic disease or meningitis EXAM: CT HEAD WITHOUT CONTRAST TECHNIQUE: Contiguous axial images were obtained from the base of the skull through the vertex without intravenous contrast. COMPARISON:  MRI 06/24/2020 FINDINGS: Brain: No acute intracranial hemorrhage. No midline shift or mass effect. Gray-white differentiation maintained. Unremarkable appearance of the ventricular system. There are punctate hyperdensities in the left frontal (image 14, 13), and left cerebellar (image 9) region. No prior CT comparison. No confluent hypodensity to indicate edema. Mild volume loss Vascular: Intracranial atherosclerosis. Skull: No acute fracture.  No aggressive bone lesion identified. Sinuses/Orbits: Partial opacification of ethmoid air cells, frontal sinuses, with surgical changes of the ethmoids and a right maxillary antrectomy, probable left maxillary antrectomy. On the left soft tissue fills the presumed defect. No mastoid fluid. Sphenoid air cells are clear. Other: None IMPRESSION: No acute intracranial abnormality. Punctate hyperdensities in the left frontal region and left cerebellar hemisphere, likely correspond to the treated metastases discussed on yesterday's MRI. The regions of leptomeningeal enhancement and new foci of enhancement concerning for progression have no correlate on the current CT. Electronically Signed   By: Corrie Mckusick D.O.   On: 06/25/2020 14:19   MR Brain W Wo Contrast  Result Date: 06/24/2020 CLINICAL DATA:  Follow-up  metastatic non-small  cell lung cancer with history of brain metastases treated with whole-brain radiation therapy. EXAM: MRI HEAD WITHOUT AND WITH CONTRAST TECHNIQUE: Multiplanar, multiecho pulse sequences of the brain and surrounding structures were obtained without and with intravenous contrast. CONTRAST:  54m GADAVIST GADOBUTROL 1 MMOL/ML IV SOLN COMPARISON:  03/24/2020 FINDINGS: Brain: A 3 mm enhancing focus in the paramedian left parietal lobe has slightly enlarged (series 11, image 212, previously 2 mm). A 2 mm focus of enhancement in the left parieto-occipital white matter is new (series 1100, image 196). There are 6 other punctate foci of enhancement scattered in both cerebral hemispheres which are more conspicuous than on the prior study but all correspond to previously treated metastases and with the better visualization today attributed to differences in technique. There is new abnormal leptomeningeal enhancement within multiple right parietal, right occipital, and high right frontal sulci. There are 2 or 3 punctate foci of cortical/subcortical trace diffusion weighted signal hyperintensity in the right parietal lobe without definite reduced ADC. Patchy T2 hyperintensities in the cerebral white matter bilaterally have slightly progressed and are nonspecific but likely reflect a combination of chronic small vessel ischemia and post treatment changes. There is mild cerebral atrophy. A partially empty sella is unchanged. 6 mm enhancing extra-axial masses over the left frontal and right frontoparietal convexities are unchanged and consistent with meningiomas (series 1100, images 245 and 250). Vascular: Major intracranial vascular flow voids are preserved. Skull and upper cervical spine: No suspicious marrow lesion. Sinuses/Orbits: Prior functional endoscopic sinus surgery. Mild scattered mucosal thickening in the paranasal sinuses. Trace bilateral mastoid fluid. Other: None. IMPRESSION: 1. 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 Infection/meningitis could have a similar appearance but there are no reported symptoms to suggest this. While a couple of punctate subacute infarcts are questioned in the right parietal lobe, there is no large area of ischemia to suggest that this instead represents benign vascular enhancement. 2. New 2 mm enhancing lesion in the left parieto-occipital white matter suspicious for a metastasis. 3. Minimally increased size of a treated left parietal metastasis. 4. Additional punctate foci of cerebral enhancement bilaterally correspond to previously treated metastases. 5. Unchanged small left frontal and right frontoparietal meningiomas. Electronically Signed   By: ALogan BoresM.D.   On: 06/24/2020 15:26   NM PET Image Restag (PS) Skull Base To Thigh  Result Date: 07/08/2020 CLINICAL DATA:  Subsequent treatment strategy for non-small cell lung cancer. EXAM: NUCLEAR MEDICINE PET SKULL BASE TO THIGH TECHNIQUE: 9.8 mCi F-18 FDG was injected intravenously. Full-ring PET imaging was performed from the skull base to thigh after the radiotracer. CT data was obtained and used for attenuation correction and anatomic localization. Fasting blood glucose: 147 mg/dl COMPARISON:  Multiple exams, including 05/14/2018 and CT scan from 04/18/2020 FINDINGS: Mediastinal blood pool activity: SUV max 3.3 Liver activity: SUV max 4.8 NECK: The known treated metastatic lesions in the brain and leptomeningeal enhancement shown on recent brain MRI do not have a significant corresponding appreciable abnormality on PET-CT, likely partially attributable to the small size of these lesions. No significant abnormal hypermetabolic lesion is identified in the neck. Incidental CT findings: Bilateral common carotid atherosclerotic calcification. Prior sinus surgeries including maxillary antrostomies and partial ethmoidectomies. CHEST: Along the regions of bandlike density  in volume loss extending in the paramediastinal regions and lower lobes, we demonstrate only low-grade activity without substantial focal mass like appearance. The left lower lobe volume loss currently has a maximum SUV of  3.3, similar to the background blood pool activity level. The right suprahilar bandlike density has a maximum SUV of 3.2. Slightly higher activity in the left perihilar volume loss region, maximum SUV 3.9, but without a well-defined mass. Incidental CT findings: Atherosclerosis of the right coronary artery and thoracic aorta. The previous trace left pleural effusion has resolved. ABDOMEN/PELVIS: No significant abnormal hypermetabolic activity in this region. Incidental CT findings: Aortoiliac atherosclerotic vascular disease. Scattered diverticula in the sigmoid colon. SKELETON: No significant abnormal hypermetabolic activity in this region. Incidental CT findings: Multilevel lumbar degenerative facet arthropathy. Thoracic spondylosis. IMPRESSION: 1. Low-grade activity roughly similar to blood pool in the perihilar and upper and lower lobe bandlike densities which likely represent regions of radiation fibrosis and volume loss. There is some left hilar activity slightly greater than blood pool levels but without a well-defined lesion to suggest active malignancy. 2. The known treated metastatic lesions in the brain and leptomeningeal enhancement shown on prior brain MRI do not manifest as a significant finding on PET-CT, likely attributable to small size of the lesions. 3. Other imaging findings of potential clinical significance: Aortic Atherosclerosis (ICD10-I70.0). Coronary atherosclerosis. Thoracolumbar spondylosis. Electronically Signed   By: Van Clines M.D.   On: 07/08/2020 08:46   DG HIP UNILAT WITH PELVIS 2-3 VIEWS RIGHT  Result Date: 06/14/2020 CLINICAL DATA:  Right hip pain.  Right thigh pain. EXAM: DG HIP (WITH OR WITHOUT PELVIS) 2-3V RIGHT; RIGHT FEMUR 2 VIEWS COMPARISON:   CT dated April 18, 2020. FINDINGS: There are mild degenerative changes of both hips. There is no acute displaced fracture or dislocation. There are calcifications projecting over the patient's pelvis that are favored to represent phleboliths. Vascular calcifications are noted. There is no acute osseous abnormality involving the patient's right femur. IMPRESSION: Mild degenerative changes of both hips. No acute displaced fracture or dislocation involving the patient's right femur. Electronically Signed   By: Constance Holster M.D.   On: 06/14/2020 23:21   DG FEMUR, MIN 2 VIEWS RIGHT  Result Date: 06/14/2020 CLINICAL DATA:  Right hip pain.  Right thigh pain. EXAM: DG HIP (WITH OR WITHOUT PELVIS) 2-3V RIGHT; RIGHT FEMUR 2 VIEWS COMPARISON:  CT dated April 18, 2020. FINDINGS: There are mild degenerative changes of both hips. There is no acute displaced fracture or dislocation. There are calcifications projecting over the patient's pelvis that are favored to represent phleboliths. Vascular calcifications are noted. There is no acute osseous abnormality involving the patient's right femur. IMPRESSION: Mild degenerative changes of both hips. No acute displaced fracture or dislocation involving the patient's right femur. Electronically Signed   By: Constance Holster M.D.   On: 06/14/2020 23:21     ASSESSMENT:  1. Metastatic adenocarcinoma of the left lung with multiple brain mets: -Guardant 360 with EGFR exon 19 deletion. Patient never smoker. -Osimertinib 80 mg daily started on 07/28/2018, dose reduced due to weight loss and decreased appetite in July 2020. -CT chest on 11/25/2019 shows stable disease with no evidence of recurrence or metastasis. -CT chest, abdomen and pelvis on 04/18/2020 with no findings of recurrent malignancy. Prior radiation therapy changes. Stable trace left pleural effusion.  2. Brain metastasis: -20 subcentimeter brain lesions and diagnosis. -Whole brain RT completed on  07/29/2018. -MRI of the brain with and without contrast on 09/12/2019 showed increased conspicuity of a 3 mm focus of enhancement at site of treated anterior left temporal lobe lesion. Increased conspicuity is related to improved imaging quality on the present exam. No new intracranial metastasis. -MRI  of the brain with and without contrast on 12/21/2019 with no evidence of new or progressive disease. Minimal punctate enhancement remains visible at the site of previously treated left temporal lesion. 4 mm meningioma at the right frontoparietal vertex and 8 mm meningioma at the left anterior cranial fossa convexity are unchanged without mass-effect upon the brain. 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  New 2 mm enhancing lesion in the left.  Occipital white matter suspicious for meta stasis.  Minimally increased size of the treated left parietal metastasis.  Additional punctate foci of cerebral enhancement bilaterally corresponding to previously treated mets.    PLAN:  1. Metastatic adenocarcinoma of the left lung with multiple brain mets: -I have reviewed PET scan results from 07/07/2020 which showed low-grade activity in the lungs consistent with radiation fibrosis and volume loss.  No evidence of recurrence or metastatic disease outside the brain. -Reviewed labs from 06/25/2020 which showed normal chemistries and CBC. -Hence I have recommended continuing osimertinib 40 mg daily at this time.  She is tolerating it very well without any major side effects. -RTC 6 weeks for follow-up.  2. Brain metastasis: -She initially had intermittent left upper extremity weakness. -Now she reports that her weakness has extended to left lower extremity also. -Examination shows muscle strength of 3 out of 5 in the left upper extremity and lower extremity. -Likely from meningeal meta  stasis.  She started XRT 30 Gray in 15 fractions on 07/11/2020. -I have recommended physical therapy evaluation and treatment.  3. Recurrent PE: -Continue Lovenox indefinitely.  No bleeding issues reported.  4. Chronic cough: -Cough has improved from last visit.   Orders placed this encounter:  No orders of the defined types were placed in this encounter.    Derek Jack, MD [place] [phone]   I, Milinda Antis, am acting as a scribe for Dr. Sanda Linger.  I, Derek Jack MD, have reviewed the above documentation for accuracy and completeness, and I agree with the above.

## 2020-07-12 NOTE — Patient Instructions (Signed)
[place] at [facility] Discharge Instructions  You were seen today by Dr. Delton Coombes. He went over your recent results. Continue going to radiation and continue taking Tagrisso daily. You will be referred to physical therapy to help strengthen your left side. Dr. Delton Coombes will see you back in 5 weeks for labs and follow up.   Thank you for choosing [place] at [name] to provide your oncology and hematology care.  To afford each patient quality time with our provider, please arrive at least 15 minutes before your scheduled appointment time.   If you have a lab appointment with the [facility] [facility] please come in thru the Main Entrance and check in at the main information desk  You need to re-schedule your appointment should you arrive 10 or more minutes late.  We strive to give you quality time with our providers, and arriving late affects you and other patients whose appointments are after yours.  Also, if you no show three or more times for appointments you may be dismissed from the clinic at the providers discretion.     Again, thank you for choosing [facility].  Our hope is that these requests will decrease the amount of time that you wait before being seen by our physicians.       _____________________________________________________________  Should you have questions after your visit to [facility], please contact our office at [phone] between the hours of 8:00 a.m. and 4:30 p.m.  Voicemails left after 4:00 p.m. will not be returned until the following business day.  For prescription refill requests, have your pharmacy contact our office and allow 72 hours.    Cancer Center Support Programs:   > Cancer Support Group  2nd Tuesday of the month 1pm-2pm, Journey Room

## 2020-07-13 ENCOUNTER — Ambulatory Visit
Admission: RE | Admit: 2020-07-13 | Discharge: 2020-07-13 | Disposition: A | Discharge status: Home / Self Care | Payer: Medicare HMO | Source: Ambulatory Visit | Attending: Radiation Oncology | Admitting: Radiation Oncology

## 2020-07-13 ENCOUNTER — Other Ambulatory Visit: Payer: Self-pay

## 2020-07-13 DIAGNOSIS — Z510 Encounter for antineoplastic radiation therapy: Secondary | ICD-10-CM | POA: Diagnosis not present

## 2020-07-13 DIAGNOSIS — C7931 Secondary malignant neoplasm of brain: Secondary | ICD-10-CM | POA: Diagnosis not present

## 2020-07-14 ENCOUNTER — Ambulatory Visit
Admission: RE | Admit: 2020-07-14 | Discharge: 2020-07-14 | Disposition: A | Discharge status: Home / Self Care | Payer: Medicare HMO | Source: Ambulatory Visit | Attending: Radiation Oncology | Admitting: Radiation Oncology

## 2020-07-14 ENCOUNTER — Other Ambulatory Visit: Payer: Self-pay

## 2020-07-14 DIAGNOSIS — C7931 Secondary malignant neoplasm of brain: Secondary | ICD-10-CM | POA: Diagnosis not present

## 2020-07-14 DIAGNOSIS — Z510 Encounter for antineoplastic radiation therapy: Secondary | ICD-10-CM | POA: Diagnosis not present

## 2020-07-15 ENCOUNTER — Ambulatory Visit
Admission: RE | Admit: 2020-07-15 | Discharge: 2020-07-15 | Disposition: A | Discharge status: Home / Self Care | Payer: Medicare HMO | Source: Ambulatory Visit | Attending: Radiation Oncology | Admitting: Radiation Oncology

## 2020-07-15 ENCOUNTER — Other Ambulatory Visit: Payer: Self-pay

## 2020-07-15 ENCOUNTER — Other Ambulatory Visit: Payer: Self-pay | Admitting: Radiation Oncology

## 2020-07-15 DIAGNOSIS — C7931 Secondary malignant neoplasm of brain: Secondary | ICD-10-CM

## 2020-07-15 DIAGNOSIS — Z510 Encounter for antineoplastic radiation therapy: Secondary | ICD-10-CM | POA: Diagnosis not present

## 2020-07-18 ENCOUNTER — Ambulatory Visit
Admission: RE | Admit: 2020-07-18 | Discharge: 2020-07-18 | Disposition: A | Discharge status: Home / Self Care | Payer: Medicare HMO | Source: Ambulatory Visit | Attending: Radiation Oncology | Admitting: Radiation Oncology

## 2020-07-18 ENCOUNTER — Other Ambulatory Visit: Payer: Self-pay

## 2020-07-18 DIAGNOSIS — Z510 Encounter for antineoplastic radiation therapy: Secondary | ICD-10-CM | POA: Diagnosis not present

## 2020-07-18 DIAGNOSIS — C7931 Secondary malignant neoplasm of brain: Secondary | ICD-10-CM | POA: Diagnosis not present

## 2020-07-18 DIAGNOSIS — C3432 Malignant neoplasm of lower lobe, left bronchus or lung: Secondary | ICD-10-CM | POA: Diagnosis not present

## 2020-07-19 ENCOUNTER — Other Ambulatory Visit: Payer: Self-pay

## 2020-07-19 ENCOUNTER — Ambulatory Visit
Admission: RE | Admit: 2020-07-19 | Discharge: 2020-07-19 | Disposition: A | Discharge status: Home / Self Care | Payer: Medicare HMO | Source: Ambulatory Visit | Attending: Radiation Oncology | Admitting: Radiation Oncology

## 2020-07-19 DIAGNOSIS — C7931 Secondary malignant neoplasm of brain: Secondary | ICD-10-CM | POA: Diagnosis not present

## 2020-07-19 DIAGNOSIS — Z510 Encounter for antineoplastic radiation therapy: Secondary | ICD-10-CM | POA: Diagnosis not present

## 2020-07-20 ENCOUNTER — Other Ambulatory Visit: Payer: Self-pay

## 2020-07-20 ENCOUNTER — Ambulatory Visit
Admission: RE | Admit: 2020-07-20 | Discharge: 2020-07-20 | Disposition: A | Discharge status: Home / Self Care | Payer: Medicare HMO | Source: Ambulatory Visit | Attending: Radiation Oncology | Admitting: Radiation Oncology

## 2020-07-20 DIAGNOSIS — C7931 Secondary malignant neoplasm of brain: Secondary | ICD-10-CM | POA: Diagnosis not present

## 2020-07-20 DIAGNOSIS — Z510 Encounter for antineoplastic radiation therapy: Secondary | ICD-10-CM | POA: Diagnosis not present

## 2020-07-21 ENCOUNTER — Other Ambulatory Visit: Payer: Self-pay

## 2020-07-21 ENCOUNTER — Ambulatory Visit
Admission: RE | Admit: 2020-07-21 | Discharge: 2020-07-21 | Disposition: A | Discharge status: Home / Self Care | Payer: Medicare HMO | Source: Ambulatory Visit | Attending: Radiation Oncology | Admitting: Radiation Oncology

## 2020-07-21 DIAGNOSIS — Z510 Encounter for antineoplastic radiation therapy: Secondary | ICD-10-CM | POA: Diagnosis not present

## 2020-07-21 DIAGNOSIS — C7931 Secondary malignant neoplasm of brain: Secondary | ICD-10-CM | POA: Diagnosis not present

## 2020-07-22 ENCOUNTER — Other Ambulatory Visit: Payer: Self-pay

## 2020-07-22 ENCOUNTER — Ambulatory Visit
Admission: RE | Admit: 2020-07-22 | Discharge: 2020-07-22 | Disposition: A | Discharge status: Home / Self Care | Payer: Medicare HMO | Source: Ambulatory Visit | Attending: Radiation Oncology | Admitting: Radiation Oncology

## 2020-07-22 DIAGNOSIS — C7931 Secondary malignant neoplasm of brain: Secondary | ICD-10-CM | POA: Diagnosis not present

## 2020-07-22 DIAGNOSIS — Z510 Encounter for antineoplastic radiation therapy: Secondary | ICD-10-CM | POA: Diagnosis not present

## 2020-07-25 ENCOUNTER — Ambulatory Visit
Admission: RE | Admit: 2020-07-25 | Discharge: 2020-07-25 | Disposition: A | Discharge status: Home / Self Care | Payer: Medicare HMO | Source: Ambulatory Visit | Attending: Radiation Oncology | Admitting: Radiation Oncology

## 2020-07-25 DIAGNOSIS — C7931 Secondary malignant neoplasm of brain: Secondary | ICD-10-CM | POA: Diagnosis not present

## 2020-07-25 DIAGNOSIS — Z510 Encounter for antineoplastic radiation therapy: Secondary | ICD-10-CM | POA: Diagnosis not present

## 2020-07-25 DIAGNOSIS — I10 Essential (primary) hypertension: Secondary | ICD-10-CM | POA: Diagnosis not present

## 2020-07-25 DIAGNOSIS — J45909 Unspecified asthma, uncomplicated: Secondary | ICD-10-CM | POA: Diagnosis not present

## 2020-07-25 DIAGNOSIS — J4540 Moderate persistent asthma, uncomplicated: Secondary | ICD-10-CM | POA: Diagnosis not present

## 2020-07-26 ENCOUNTER — Ambulatory Visit
Admission: RE | Admit: 2020-07-26 | Discharge: 2020-07-26 | Disposition: A | Discharge status: Home / Self Care | Payer: Medicare Other | Source: Ambulatory Visit | Attending: Radiation Oncology | Admitting: Radiation Oncology

## 2020-07-26 DIAGNOSIS — C7931 Secondary malignant neoplasm of brain: Secondary | ICD-10-CM | POA: Diagnosis not present

## 2020-07-26 DIAGNOSIS — Z510 Encounter for antineoplastic radiation therapy: Secondary | ICD-10-CM | POA: Insufficient documentation

## 2020-07-26 DIAGNOSIS — C3432 Malignant neoplasm of lower lobe, left bronchus or lung: Secondary | ICD-10-CM | POA: Diagnosis not present

## 2020-07-27 ENCOUNTER — Other Ambulatory Visit: Payer: Self-pay

## 2020-07-27 ENCOUNTER — Ambulatory Visit
Admission: RE | Admit: 2020-07-27 | Discharge: 2020-07-27 | Disposition: A | Discharge status: Home / Self Care | Payer: Medicare Other | Source: Ambulatory Visit | Attending: Radiation Oncology | Admitting: Radiation Oncology

## 2020-07-27 DIAGNOSIS — C7931 Secondary malignant neoplasm of brain: Secondary | ICD-10-CM | POA: Diagnosis not present

## 2020-07-27 DIAGNOSIS — Z510 Encounter for antineoplastic radiation therapy: Secondary | ICD-10-CM | POA: Diagnosis not present

## 2020-07-27 NOTE — Progress Notes (Signed)
Received call from radiation therapist on L3. Therapist reported concerns that the patient is warm to touch and coughing frequently. Instructed therapist to send patient to nursing following treatment for an evaluation.   Received patient in the clinic following treatment. Vitals stable. Patient denies pain. Patient did not cough once during our encounter. Patient continues to report a rare dry cough. Patient denies feeling ill. Patient denies any needs at this time. No covid like symptoms noted. Patient discharged home with her daughter. Patient's daughter wheeled her out of the clinic. No distress noted.

## 2020-07-28 ENCOUNTER — Ambulatory Visit
Admission: RE | Admit: 2020-07-28 | Discharge: 2020-07-28 | Disposition: A | Discharge status: Home / Self Care | Payer: Medicare Other | Source: Ambulatory Visit | Attending: Radiation Oncology | Admitting: Radiation Oncology

## 2020-07-28 ENCOUNTER — Other Ambulatory Visit: Payer: Self-pay

## 2020-07-28 DIAGNOSIS — C7931 Secondary malignant neoplasm of brain: Secondary | ICD-10-CM | POA: Diagnosis not present

## 2020-07-28 DIAGNOSIS — Z510 Encounter for antineoplastic radiation therapy: Secondary | ICD-10-CM | POA: Diagnosis not present

## 2020-07-29 ENCOUNTER — Other Ambulatory Visit: Payer: Self-pay

## 2020-07-29 ENCOUNTER — Ambulatory Visit
Admission: RE | Admit: 2020-07-29 | Discharge: 2020-07-29 | Disposition: A | Discharge status: Home / Self Care | Payer: Medicare Other | Source: Ambulatory Visit | Attending: Radiation Oncology | Admitting: Radiation Oncology

## 2020-07-29 DIAGNOSIS — C7931 Secondary malignant neoplasm of brain: Secondary | ICD-10-CM | POA: Diagnosis not present

## 2020-07-29 DIAGNOSIS — Z510 Encounter for antineoplastic radiation therapy: Secondary | ICD-10-CM | POA: Diagnosis not present

## 2020-08-02 ENCOUNTER — Other Ambulatory Visit: Payer: Self-pay

## 2020-08-02 ENCOUNTER — Ambulatory Visit (HOSPITAL_COMMUNITY): Payer: Medicare Other | Attending: Hematology | Admitting: Physical Therapy

## 2020-08-02 ENCOUNTER — Encounter (HOSPITAL_COMMUNITY): Payer: Self-pay | Admitting: Physical Therapy

## 2020-08-02 DIAGNOSIS — R2689 Other abnormalities of gait and mobility: Secondary | ICD-10-CM

## 2020-08-02 DIAGNOSIS — R262 Difficulty in walking, not elsewhere classified: Secondary | ICD-10-CM

## 2020-08-02 DIAGNOSIS — M6281 Muscle weakness (generalized): Secondary | ICD-10-CM | POA: Diagnosis not present

## 2020-08-02 DIAGNOSIS — C3492 Malignant neoplasm of unspecified part of left bronchus or lung: Secondary | ICD-10-CM | POA: Diagnosis not present

## 2020-08-02 NOTE — Therapy (Signed)
long-term anticoagulation therapy ([place]) 06/16/2018  . Lung mass   . Pulmonary emboli ([place]) 04/24/2018  . Routine screening for STI (sexually transmitted infection) 03/14/2016   12:25 PM, 08/02/20 Josue Hector PT DPT  Physical Therapist with [facility]  [phone] [place], [place], [address] Phone: [phone]   Fax:  [phone]  Name: Kaitlin [name] MRN: [id number] Date of Birth: 02/08/1951  long-term anticoagulation therapy ([place]) 06/16/2018  . Lung mass   . Pulmonary emboli ([place]) 04/24/2018  . Routine screening for STI (sexually transmitted infection) 03/14/2016   12:25 PM, 08/02/20 Josue Hector PT DPT  Physical Therapist with [facility]  [phone] [place], [place], [address] Phone: [phone]   Fax:  [phone]  Name: Kaitlin [name] MRN: [id number] Date of Birth: 02/08/1951  long-term anticoagulation therapy ([place]) 06/16/2018  . Lung mass   . Pulmonary emboli ([place]) 04/24/2018  . Routine screening for STI (sexually transmitted infection) 03/14/2016   12:25 PM, 08/02/20 Josue Hector PT DPT  Physical Therapist with [facility]  [phone] [place], [place], [address] Phone: [phone]   Fax:  [phone]  Name: Kaitlin [name] MRN: [id number] Date of Birth: 02/08/1951  [place] [address] [place], [place], [address] Phone: [phone]   Fax:  [phone]  Physical Therapy Evaluation  Patient Details  Name: Kaitlin [name] MRN: [id number] Date of Birth: Oct 03, 1950 Referring Provider (PT): Derek Jack MD   Encounter Date: 08/02/2020   PT End of Session - 08/02/20 1109    Visit Number 1    Number of Visits 8    Date for PT Re-Evaluation 08/30/20    Authorization Type [employer]    Authorization Time Period Check auth    PT Start Time 1032    PT Stop Time 1115    PT Time Calculation (min) 43 min    Equipment Utilized During Treatment Gait belt    Activity Tolerance Patient tolerated treatment well;Patient limited by fatigue    Behavior During Therapy [facility] for tasks assessed/performed           Past Medical History:  Diagnosis Date  . Asthma   . Cancer ([place]) 04/21/2018   Lung Cancer   . Carpal tunnel syndrome   . Cataract    left eye  . Diabetes mellitus without complication ([place])   . DVT (deep venous thrombosis) ([place])   . Hypertension   . Pneumonia 04/21/2018  . Pulmonary embolism ([place]) 03/2018  . Sepsis [facility])     Past Surgical History:  Procedure Laterality Date  . ABDOMINAL HYSTERECTOMY    . biopsy of right breast    . ESOPHAGOGASTRODUODENOSCOPY (EGD) WITH PROPOFOL N/A 09/12/2018   Procedure: ESOPHAGOGASTRODUODENOSCOPY (EGD) WITH PROPOFOL (scope in 1017, scope out 1032);  Surgeon: Virl Cagey, MD;  Location: [facility];  Service: General;  Laterality: N/A;  . FOOT SURGERY    . implant left eye    . INTRAOCULAR LENS INSERTION    . IR GASTROSTOMY TUBE MOD SED  12/05/2018  . PEG PLACEMENT N/A 09/12/2018   Procedure: PERCUTANEOUS ENDOSCOPIC GASTROSTOMY (PEG) PLACEMENT;  Surgeon: Virl Cagey, MD;  Location: [facility];  Service: General;  Laterality: N/A;    There were no vitals filed for this visit.    Subjective Assessment - 08/02/20 1041    Subjective Patient  presents to physical therapy with complaint of LT sided weakness. She is with her Daughter today. Patient states she has metastatic brain cancer which has been occluding some part of her brain which has caused recent LT sided weakness. She says this began about 3 weeks ago. She was given a course of steroids but with no lasting effect. She has recently finished radiation treatments. Up until 3 weeks ago she was walking unassisted. She is using a wheelchair for mobility currently.    Pertinent History Stage 4 brain cancer, lung cancer, asthma, HTN    Limitations Standing;Walking;House hold activities    How long can you stand comfortably? 5-7 minutes    How long can you walk comfortably? Currently unable    Patient Stated Goals Get back to walking on my own    Currently in Pain? No/denies              [facility] PT Assessment - 08/02/20 0001      Assessment   Medical Diagnosis Weakness    Referring Provider (PT) Derek Jack MD    Onset Date/Surgical Date 06/25/20    Prior Therapy No      Balance Screen   Has the patient fallen in the past 6 months Yes    How many times? 2    Has the

## 2020-08-09 ENCOUNTER — Encounter (HOSPITAL_COMMUNITY): Payer: Self-pay

## 2020-08-10 ENCOUNTER — Other Ambulatory Visit: Payer: Self-pay

## 2020-08-10 ENCOUNTER — Encounter (HOSPITAL_COMMUNITY): Payer: Self-pay | Admitting: Physical Therapy

## 2020-08-10 ENCOUNTER — Ambulatory Visit (HOSPITAL_COMMUNITY): Payer: Medicare Other | Admitting: Physical Therapy

## 2020-08-10 DIAGNOSIS — M6281 Muscle weakness (generalized): Secondary | ICD-10-CM

## 2020-08-10 DIAGNOSIS — R262 Difficulty in walking, not elsewhere classified: Secondary | ICD-10-CM

## 2020-08-10 DIAGNOSIS — R2689 Other abnormalities of gait and mobility: Secondary | ICD-10-CM

## 2020-08-10 NOTE — Therapy (Signed)
walking,Decreased mobility,Decreased strength,Postural dysfunction,Improper body mechanics  Visit Diagnosis: Other abnormalities of gait and mobility  Difficulty in walking, not elsewhere classified  Muscle weakness (generalized)     Problem List Patient Active Problem List   Diagnosis Date Noted  . Prolapsed internal hemorrhoids, grade 3 04/18/2019  . Anal polyp 04/18/2019  . Malnutrition of moderate degree 12/03/2018  . PEG tube malfunction ([place]) 12/02/2018  . History of pulmonary embolism 12/02/2018  . Protein-calorie malnutrition, severe 09/12/2018  . Metastatic lung cancer (metastasis from lung to other site), left ([place])   . Nausea and vomiting 09/11/2018  . Weight loss 09/11/2018  . Hypokalemia 09/09/2018  . Brain metastases ([place]) 07/12/2018  . Malignant pericardial effusion ([place]) 07/08/2018  . HCAP (healthcare-associated pneumonia) 07/06/2018  . Adenocarcinoma of lung, right ([place]) 07/06/2018  . Adenocarcinoma, lung, left ([place]) 07/03/2018  . Asthma 06/16/2018  . Hypertension 06/16/2018  . Diabetes mellitus without complication ([place]) 26/33/3545  . DVT (deep venous thrombosis) ([place]) 06/16/2018  . Pulmonary embolism during treatment with long-term anticoagulation therapy ([place])  06/16/2018  . Lung mass   . Pulmonary emboli ([place]) 04/24/2018  . Routine screening for STI (sexually transmitted infection) 03/14/2016    Rayetta Humphrey, PT CLT [phone] 08/10/2020, 11:37 AM  [place] [address] [place], [place], [address] Phone: [phone]   Fax:  [phone]  Name: Kaitlin [name] MRN: [id number] Date of Birth: 1950/10/21  [place] [address] [place], [place], [address] Phone: [phone]   Fax:  [phone]  Physical Therapy Treatment  Patient Details  Name: Kaitlin [name] MRN: [id number] Date of Birth: 1951/01/07 Referring Provider (PT): Derek Jack MD   Encounter Date: 08/10/2020   PT End of Session - 08/10/20 1136    Visit Number 2    Number of Visits 8    Date for PT Re-Evaluation 08/30/20    Authorization Type [employer] medicare   Authorization Time Period No auth   PT Start Time 1050    PT Stop Time 1128    PT Time Calculation (min) 38 min    Equipment Utilized During Treatment Gait belt    Activity Tolerance Patient tolerated treatment well;Patient limited by fatigue    Behavior During Therapy [facility] for tasks assessed/performed           Past Medical History:  Diagnosis Date  . Asthma   . Cancer ([place]) 04/21/2018   Lung Cancer   . Carpal tunnel syndrome   . Cataract    left eye  . Diabetes mellitus without complication ([place])   . DVT (deep venous thrombosis) ([place])   . Hypertension   . Pneumonia 04/21/2018  . Pulmonary embolism ([place]) 03/2018  . Sepsis [facility])     Past Surgical History:  Procedure Laterality Date  . ABDOMINAL HYSTERECTOMY    . biopsy of right breast    . ESOPHAGOGASTRODUODENOSCOPY (EGD) WITH PROPOFOL N/A 09/12/2018   Procedure: ESOPHAGOGASTRODUODENOSCOPY (EGD) WITH PROPOFOL (scope in 1017, scope out 1032);  Surgeon: Virl Cagey, MD;  Location: [facility];  Service: General;  Laterality: N/A;  . FOOT SURGERY    . implant left eye    . INTRAOCULAR LENS INSERTION    . IR GASTROSTOMY TUBE MOD SED  12/05/2018  . PEG PLACEMENT N/A 09/12/2018   Procedure: PERCUTANEOUS ENDOSCOPIC GASTROSTOMY (PEG) PLACEMENT;  Surgeon: Virl Cagey, MD;  Location: [facility];  Service: General;  Laterality: N/A;    There were no vitals filed for this visit.   Subjective Assessment - 08/10/20 1051    Subjective Pt states that she  walks in the hallway at her home.  She has not been doing the exercises that were given her due to the fact that her Lt leg is hurting so bad.    Pertinent History Stage 4 brain cancer, lung cancer, asthma, HTN    Limitations Standing;Walking;House hold activities    How long can you stand comfortably? 5-7 minutes    How long can you walk comfortably? Currently unable    Patient Stated Goals Get back to walking on my own    Currently in Pain? No/denies                   [facility] Adult PT Treatment/Exercise - 08/10/20 0001      Ambulation/Gait   Ambulation/Gait Yes    Ambulation/Gait Assistance 5: Supervision    Ambulation Distance (Feet) 75 Feet   first and second gt 62fw/rw; 3rd time 736   Assistive device Rolling walker    Gait Comments encouraging step thru gt as well as standing erect.      Exercises   Exercises Knee/Hip      Knee/Hip Exercises: Standing   Heel Raises Both;10 reps    Hip Abduction Both;10 reps      Knee/Hip Exercises: Seated   Sit to Sand 5 reps  [place] [address] [place], [place], [address] Phone: [phone]   Fax:  [phone]  Physical Therapy Treatment  Patient Details  Name: Kaitlin [name] MRN: [id number] Date of Birth: 1951/01/07 Referring Provider (PT): Derek Jack MD   Encounter Date: 08/10/2020   PT End of Session - 08/10/20 1136    Visit Number 2    Number of Visits 8    Date for PT Re-Evaluation 08/30/20    Authorization Type [employer] medicare   Authorization Time Period No auth   PT Start Time 1050    PT Stop Time 1128    PT Time Calculation (min) 38 min    Equipment Utilized During Treatment Gait belt    Activity Tolerance Patient tolerated treatment well;Patient limited by fatigue    Behavior During Therapy [facility] for tasks assessed/performed           Past Medical History:  Diagnosis Date  . Asthma   . Cancer ([place]) 04/21/2018   Lung Cancer   . Carpal tunnel syndrome   . Cataract    left eye  . Diabetes mellitus without complication ([place])   . DVT (deep venous thrombosis) ([place])   . Hypertension   . Pneumonia 04/21/2018  . Pulmonary embolism ([place]) 03/2018  . Sepsis [facility])     Past Surgical History:  Procedure Laterality Date  . ABDOMINAL HYSTERECTOMY    . biopsy of right breast    . ESOPHAGOGASTRODUODENOSCOPY (EGD) WITH PROPOFOL N/A 09/12/2018   Procedure: ESOPHAGOGASTRODUODENOSCOPY (EGD) WITH PROPOFOL (scope in 1017, scope out 1032);  Surgeon: Virl Cagey, MD;  Location: [facility];  Service: General;  Laterality: N/A;  . FOOT SURGERY    . implant left eye    . INTRAOCULAR LENS INSERTION    . IR GASTROSTOMY TUBE MOD SED  12/05/2018  . PEG PLACEMENT N/A 09/12/2018   Procedure: PERCUTANEOUS ENDOSCOPIC GASTROSTOMY (PEG) PLACEMENT;  Surgeon: Virl Cagey, MD;  Location: [facility];  Service: General;  Laterality: N/A;    There were no vitals filed for this visit.   Subjective Assessment - 08/10/20 1051    Subjective Pt states that she  walks in the hallway at her home.  She has not been doing the exercises that were given her due to the fact that her Lt leg is hurting so bad.    Pertinent History Stage 4 brain cancer, lung cancer, asthma, HTN    Limitations Standing;Walking;House hold activities    How long can you stand comfortably? 5-7 minutes    How long can you walk comfortably? Currently unable    Patient Stated Goals Get back to walking on my own    Currently in Pain? No/denies                   [facility] Adult PT Treatment/Exercise - 08/10/20 0001      Ambulation/Gait   Ambulation/Gait Yes    Ambulation/Gait Assistance 5: Supervision    Ambulation Distance (Feet) 75 Feet   first and second gt 62fw/rw; 3rd time 736   Assistive device Rolling walker    Gait Comments encouraging step thru gt as well as standing erect.      Exercises   Exercises Knee/Hip      Knee/Hip Exercises: Standing   Heel Raises Both;10 reps    Hip Abduction Both;10 reps      Knee/Hip Exercises: Seated   Sit to Sand 5 reps

## 2020-08-17 ENCOUNTER — Inpatient Hospital Stay (HOSPITAL_COMMUNITY): Payer: Medicare Other

## 2020-08-17 ENCOUNTER — Inpatient Hospital Stay (HOSPITAL_COMMUNITY): Payer: Medicare Other | Attending: Hematology | Admitting: Hematology

## 2020-08-17 ENCOUNTER — Encounter (HOSPITAL_COMMUNITY): Payer: Self-pay | Admitting: Physical Therapy

## 2020-08-17 ENCOUNTER — Ambulatory Visit (HOSPITAL_COMMUNITY): Payer: Medicare Other | Admitting: Physical Therapy

## 2020-08-17 ENCOUNTER — Other Ambulatory Visit: Payer: Self-pay

## 2020-08-17 VITALS — BP 116/78 | HR 104 | Temp 97.8°F | Resp 17 | Wt 186.9 lb

## 2020-08-17 DIAGNOSIS — R262 Difficulty in walking, not elsewhere classified: Secondary | ICD-10-CM | POA: Insufficient documentation

## 2020-08-17 DIAGNOSIS — R2689 Other abnormalities of gait and mobility: Secondary | ICD-10-CM

## 2020-08-17 DIAGNOSIS — Z86711 Personal history of pulmonary embolism: Secondary | ICD-10-CM | POA: Diagnosis not present

## 2020-08-17 DIAGNOSIS — C3492 Malignant neoplasm of unspecified part of left bronchus or lung: Secondary | ICD-10-CM | POA: Diagnosis not present

## 2020-08-17 DIAGNOSIS — M6281 Muscle weakness (generalized): Secondary | ICD-10-CM

## 2020-08-17 DIAGNOSIS — C3490 Malignant neoplasm of unspecified part of unspecified bronchus or lung: Secondary | ICD-10-CM

## 2020-08-17 DIAGNOSIS — C7931 Secondary malignant neoplasm of brain: Secondary | ICD-10-CM | POA: Diagnosis not present

## 2020-08-17 DIAGNOSIS — R531 Weakness: Secondary | ICD-10-CM | POA: Insufficient documentation

## 2020-08-17 LAB — CBC WITH DIFFERENTIAL/PLATELET
Abs Immature Granulocytes: 0.02 10*3/uL (ref 0.00–0.07)
Basophils Absolute: 0.1 10*3/uL (ref 0.0–0.1)
Basophils Relative: 1 %
Eosinophils Absolute: 0.7 10*3/uL — ABNORMAL HIGH (ref 0.0–0.5)
Eosinophils Relative: 16 %
HCT: 47 % — ABNORMAL HIGH (ref 36.0–46.0)
Hemoglobin: 15.1 g/dL — ABNORMAL HIGH (ref 12.0–15.0)
Immature Granulocytes: 1 %
Lymphocytes Relative: 18 %
Lymphs Abs: 0.8 10*3/uL (ref 0.7–4.0)
MCH: 28 pg (ref 26.0–34.0)
MCHC: 32.1 g/dL (ref 30.0–36.0)
MCV: 87 fL (ref 80.0–100.0)
Monocytes Absolute: 0.5 10*3/uL (ref 0.1–1.0)
Monocytes Relative: 12 %
Neutro Abs: 2.3 10*3/uL (ref 1.7–7.7)
Neutrophils Relative %: 52 %
Platelets: 183 10*3/uL (ref 150–400)
RBC: 5.4 MIL/uL — ABNORMAL HIGH (ref 3.87–5.11)
RDW: 14.5 % (ref 11.5–15.5)
WBC: 4.4 10*3/uL (ref 4.0–10.5)
nRBC: 0 % (ref 0.0–0.2)

## 2020-08-17 LAB — COMPREHENSIVE METABOLIC PANEL
ALT: 16 U/L (ref 0–44)
AST: 17 U/L (ref 15–41)
Albumin: 4 g/dL (ref 3.5–5.0)
Alkaline Phosphatase: 80 U/L (ref 38–126)
Anion gap: 12 (ref 5–15)
BUN: 11 mg/dL (ref 8–23)
CO2: 22 mmol/L (ref 22–32)
Calcium: 9.9 mg/dL (ref 8.9–10.3)
Chloride: 104 mmol/L (ref 98–111)
Creatinine, Ser: 1.03 mg/dL — ABNORMAL HIGH (ref 0.44–1.00)
GFR, Estimated: 59 mL/min — ABNORMAL LOW (ref >60)
Glucose, Bld: 105 mg/dL — ABNORMAL HIGH (ref 70–99)
Potassium: 3.7 mmol/L (ref 3.5–5.1)
Sodium: 138 mmol/L (ref 135–145)
Total Bilirubin: 0.4 mg/dL (ref 0.3–1.2)
Total Protein: 7.3 g/dL (ref 6.5–8.1)

## 2020-08-17 NOTE — Patient Instructions (Signed)
[place] at [facility] Discharge Instructions  You were seen today by Dr. Delton Coombes. He went over your recent results. Start taking Tagrisso 80 mg and your dose will be increased to 80 mg daily. Purchase Claritin over the counter and take as needed for your seasonal allergies and cough. Dr. Delton Coombes will see you back in 1 month for labs and follow up.   Thank you for choosing [place] at [facility] to provide your oncology and hematology care.  To afford each patient quality time with our provider, please arrive at least 15 minutes before your scheduled appointment time.   If you have a lab appointment with the [facility] please come in thru the Main Entrance and check in at the main information desk  You need to re-schedule your appointment should you arrive 10 or more minutes late.  We strive to give you quality time with our providers, and arriving late affects you and other patients whose appointments are after yours.  Also, if you no show three or more times for appointments you may be dismissed from the clinic at the providers discretion.     Again, thank you for choosing [facility].  Our hope is that these requests will decrease the amount of time that you wait before being seen by our physicians.       _____________________________________________________________  Should you have questions after your visit to [facility], please contact our office at [phone] between the hours of 8:00 a.m. and 4:30 p.m.  Voicemails left after 4:00 p.m. will not be returned until the following business day.  For prescription refill requests, have your pharmacy contact our office and allow 72 hours.    Cancer Center Support Programs:   > Cancer Support Group  2nd Tuesday of the month 1pm-2pm, Journey Room

## 2020-08-17 NOTE — Progress Notes (Signed)
[facility] [address], [place] [address]   CLINIC:  Medical Oncology/Hematology  PCP:  [name], MD [address] / [place] [address] [phone]   REASON FOR VISIT:  Follow-up for metastatic left lung cancer to brain  PRIOR THERAPY:  1. Left lung RT 30 Gy in 10 fractions from 07/16/2018 to 07/29/2018. 2. Whole brain RT 30 Gy in 10 fractions from 07/16/2018 to 07/29/2018. 3. Whole brain RT 30 Gy in 15 fractions from 07/12/2020 to 07/29/2020.  NGS Results: Guardant 360 EGFR exon 19 deletion  CURRENT THERAPY: Tagrisso 40 mg daily  BRIEF ONCOLOGIC HISTORY:  Oncology History   No history exists.    CANCER STAGING: Cancer Staging No matching staging information was found for the patient.  INTERVAL HISTORY:  Kaitlin [name], a 70 y.o. female, returns for routine follow-up of her metastatic left lung cancer to brain. Kaitlin [name] was last seen on 07/12/2020.   Today she is accompanied by her daughter and she reports feeling fair. According to her daughter, the patient is more forgetful and is walking less since her left leg starts giving out. She is able to move both hands. She started PT at home on 03/14, though she is not doing it as consistently. She needs help to get around inside her home and needs assistance to get in and out of bed and to shower. She is able to roll around in the bed. She is taking Tagrisso 40 mg daily and she denies having nausea. She has also been having a nonproductive cough more recently which is most likely due to seasonal allergies. Her appetite is excellent.   REVIEW OF SYSTEMS:  Review of Systems  Constitutional: Positive for fatigue (25%). Negative for appetite change.  Respiratory: Positive for cough, shortness of breath and wheezing.   Gastrointestinal: Negative for nausea.  Musculoskeletal: Positive for gait problem (d/t L leg weakness).  Neurological: Positive for extremity weakness (L leg weakness and  dyscoordination) and gait problem (d/t L leg weakness).  All other systems reviewed and are negative.   PAST MEDICAL/SURGICAL HISTORY:  Past Medical History:  Diagnosis Date  . Asthma   . Cancer (HCC) 04/21/2018   Lung Cancer   . Carpal tunnel syndrome   . Cataract    left eye  . Diabetes mellitus without complication (HCC)   . DVT (deep venous thrombosis) (HCC)   . Hypertension   . Pneumonia 04/21/2018  . Pulmonary embolism (HCC) 03/2018  . Sepsis [facility])    Past Surgical History:  Procedure Laterality Date  . ABDOMINAL HYSTERECTOMY    . biopsy of right breast    . ESOPHAGOGASTRODUODENOSCOPY (EGD) WITH PROPOFOL N/A 09/12/2018   Procedure: ESOPHAGOGASTRODUODENOSCOPY (EGD) WITH PROPOFOL (scope in 1017, scope out 1032);  Surgeon: Lucretia Roers, MD;  Location: [facility];  Service: General;  Laterality: N/A;  . FOOT SURGERY    . implant left eye    . INTRAOCULAR LENS INSERTION    . IR GASTROSTOMY TUBE MOD SED  12/05/2018  . PEG PLACEMENT N/A 09/12/2018   Procedure: PERCUTANEOUS ENDOSCOPIC GASTROSTOMY (PEG) PLACEMENT;  Surgeon: Lucretia Roers, MD;  Location: [facility];  Service: General;  Laterality: N/A;    SOCIAL HISTORY:  Social History   Socioeconomic History  . Marital status: Widowed    Spouse name: Not on file  . Number of children: 4  . Years of education: 2  . Highest education level: High school graduate  Occupational History  .  Not on file  Tobacco Use  . Smoking status: Never Smoker  . Smokeless tobacco: Never Used  Vaping Use  . Vaping Use: Never used  Substance and Sexual Activity  . Alcohol use: Not Currently    Comment: occ  . Drug use: No  . Sexual activity: Yes    Partners: Male    Birth control/protection: Surgical  Other Topics Concern  . 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   Social Determinants of Health   Financial Resource Strain: Not on file  Food Insecurity: Not on file  Transportation Needs: Not on file  Physical Activity: Not on file  Stress: Not on file  Social Connections: Not on file  Intimate Partner Violence: Not on file    FAMILY HISTORY:  Family History  Problem Relation Age of Onset  . Heart failure Mother   . Stroke Mother   . Heart attack Mother   . Kidney failure Other   . Leukemia Father     CURRENT MEDICATIONS:  Current Outpatient Medications  Medication Sig Dispense Refill  . albuterol (PROVENTIL HFA;VENTOLIN HFA) 108 (90 BASE) MCG/ACT inhaler Inhale 2 puffs into the lungs every 6 (six) hours as needed for wheezing.    Marland Kitchen amLODipine (NORVASC) 10 MG tablet Take 10 mg by mouth daily.    Marland Kitchen amLODipine (NORVASC) 5 MG tablet Take 10 mg by mouth daily.    Marland Kitchen atorvastatin (LIPITOR) 20 MG tablet Take 20 mg by mouth daily.    . benzonatate (TESSALON) 100 MG capsule Take 1 capsule (100 mg total) by mouth 3 (three) times daily as needed for cough. 30 capsule 0  . Colchicine (MITIGARE) 0.6 MG CAPS Take 0.6 mg by mouth daily as needed. 15 capsule 3  . dexamethasone (DECADRON) 4 MG tablet Take 1 tablet (4 mg total) by mouth 2 (two) times daily. 20 tablet 0  . enoxaparin (LOVENOX) 150 MG/ML injection Inject 1 mL (150 mg total) into the skin daily. 0 mL   . FLOVENT HFA 44 MCG/ACT inhaler Inhale 2 puffs into the lungs daily.     Marland Kitchen HYDROcodone-homatropine (HYCODAN) 5-1.5 MG/5ML syrup Take 5 mLs by mouth every 6 (six) hours as needed for cough. 120 mL 0  . hydrocortisone (ANUSOL-HC) 2.5 % rectal cream     . latanoprost (XALATAN) 0.005 % ophthalmic solution Place 1 drop into both eyes in the morning and at bedtime.     . meclizine (ANTIVERT) 25 MG tablet Take 25 mg by mouth as needed.     . metoCLOPramide (REGLAN) 10 MG tablet Take 1 tablet (10 mg total) by mouth every 8 (eight) hours as needed for  refractory nausea / vomiting. 30 tablet 0  . osimertinib mesylate (TAGRISSO) 40 MG tablet Take 1 tablet (40 mg total) by mouth daily. 30 tablet 2  . oxyCODONE-acetaminophen (PERCOCET/ROXICET) 5-325 MG tablet Take 1-2 tablets by mouth as needed.     . pantoprazole (PROTONIX) 40 MG tablet TAKE 1 TABLET BY MOUTH ONCE DAILY. 30 tablet 0  . potassium chloride (KLOR-CON) 10 MEQ tablet TAKE (2) TABLETS BY MOUTH TWICE DAILY. 120 tablet 3  . predniSONE (DELTASONE) 20 MG tablet Take 20 mg by mouth 2 (two) times daily.    . prochlorperazine (COMPAZINE)  10 MG tablet TAKE (1) TABLET BY MOUTH EVERY SIX HOURS AS NEEDED FOR NAUSEA OR VOMITING. 30 tablet 10  . triamcinolone cream (KENALOG) 0.1 % Apply 1 application topically as needed.    . zolpidem (AMBIEN CR) 6.25 MG CR tablet Take 1 tablet (6.25 mg total) by mouth at bedtime as needed for sleep. 30 tablet 0   No current facility-administered medications for this visit.    ALLERGIES:  Allergies  Allergen Reactions  . Banana Anaphylaxis  . Aspirin Other (See Comments)    G.I. Upset     PHYSICAL EXAM:  Performance status (ECOG): 1 - Symptomatic but completely ambulatory  Vitals:   08/17/20 1132  BP: 116/78  Pulse: (!) 104  Resp: 17  Temp: 97.8 F (36.6 C)  SpO2: 100%   Wt Readings from Last 3 Encounters:  08/17/20 186 lb 14.4 oz (84.8 kg)  07/12/20 188 lb 3.2 oz (85.4 kg)  06/28/20 197 lb 3.2 oz (89.4 kg)   Physical Exam Vitals reviewed.  Constitutional:      Appearance: Normal appearance.     Comments: In wheelchair  Cardiovascular:     Rate and Rhythm: Normal rate and regular rhythm.     Pulses: Normal pulses.     Heart sounds: Normal heart sounds.  Pulmonary:     Effort: Pulmonary effort is normal.     Breath sounds: Normal breath sounds.  Musculoskeletal:     Right lower leg: No edema.     Left lower leg: No edema.  Neurological:     General: No focal deficit present.     Mental Status: She is alert and oriented to person,  place, and time.     Motor: Weakness (2/5 hand grip; 4/5 elbow flexion; 3/5 hip flexion; 3/5 knee extension) present.  Psychiatric:        Mood and Affect: Mood normal.        Behavior: Behavior normal.      LABORATORY DATA:  I have reviewed the labs as listed.  CBC Latest Ref Rng & Units 08/17/2020 06/25/2020 04/18/2020  WBC 4.0 - 10.5 K/uL 4.4 4.7 4.4  Hemoglobin 12.0 - 15.0 g/dL 15.1(H) 15.0 15.4(H)  Hematocrit 36.0 - 46.0 % 47.0(H) 46.4(H) 47.2(H)  Platelets 150 - 400 K/uL 183 223 233   CMP Latest Ref Rng & Units 08/17/2020 06/25/2020 04/18/2020  Glucose 70 - 99 mg/dL 105(H) 97 76  BUN 8 - 23 mg/dL $Remove'11 14 13  'beFyXgK$ Creatinine 0.44 - 1.00 mg/dL 1.03(H) 1.12(H) 1.03(H)  Sodium 135 - 145 mmol/L 138 137 139  Potassium 3.5 - 5.1 mmol/L 3.7 4.1 3.5  Chloride 98 - 111 mmol/L 104 104 104  CO2 22 - 32 mmol/L $RemoveB'22 26 25  'fjJrtBed$ Calcium 8.9 - 10.3 mg/dL 9.9 10.0 9.9  Total Protein 6.5 - 8.1 g/dL 7.3 7.7 7.7  Total Bilirubin 0.3 - 1.2 mg/dL 0.4 0.4 0.4  Alkaline Phos 38 - 126 U/L 80 68 77  AST 15 - 41 U/L $Remo'17 21 22  'FLBGK$ ALT 0 - 44 U/L $Remo'16 21 20    'lHLaQ$ DIAGNOSTIC IMAGING:  I have independently reviewed the scans and discussed with the patient. No results found.   ASSESSMENT:  1. Metastatic adenocarcinoma of the left lung with multiple brain mets: -Guardant 360 with EGFR exon 19 deletion. Patient never smoker. -Osimertinib 80 mg daily started on 07/28/2018, dose reduced due to weight loss and decreased appetite in July 2020. -CT chest on 11/25/2019 shows stable disease with no evidence of recurrence or  metastasis. -CT chest, abdomen and pelvis on 04/18/2020 with no findings of recurrent malignancy. Prior radiation therapy changes. Stable trace left pleural effusion. -PET scan from 07/07/2020 showed low-grade activity in the lungs consistent with radiation fibrosis and volume loss.  No evidence of recurrence or metastatic disease outside the brain.  2. Brain metastasis: -20 subcentimeter brain lesions and  diagnosis. -Whole brain RT completed on 07/29/2018. -MRI of the brain with and without contrast on 09/12/2019 showed increased conspicuity of a 3 mm focus of enhancement at site of treated anterior left temporal lobe lesion. Increased conspicuity is related to improved imaging quality on the present exam. No new intracranial metastasis. -MRI of the brain with and without contrast on 12/21/2019 with no evidence of new or progressive disease. Minimal punctate enhancement remains visible at the site of previously treated left temporal lesion. 4 mm meningioma at the right frontoparietal vertex and 8 mm meningioma at the left anterior cranial fossa convexity are unchanged without mass-effect upon the brain. 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 New 2 mm enhancing lesion in the left. Occipital white matter suspicious for meta stasis. Minimally increased size of the treated left parietal metastasis. Additional punctate foci of cerebral enhancement bilaterally corresponding to previously treated mets.  -30 Gray in 15 fractions completed on 07/29/2020.   PLAN:  1. Metastatic adenocarcinoma of the left lung with multiple brain mets: -PET scan on 07/07/2020 did not show any evidence of recurrence outside the brain. -She is currently taking 40 mg of osimertinib daily.  No major side effects. -Reviewed labs which showed normal LFTs and CBC. -She is having physical therapy for the left leg which is weak and makes her unable to walk.  Upper extremity strength is 4 out of 5 bilaterally. -Tagrisso dose for leptomeningeal metastatic disease is 160 mg daily.  In the past she could not tolerate 80 mg, leading to weight loss and need for feeding tube placement. -I have told her to increase the dose of Tagrisso to 80 mg daily.  I will reevaluate her in 4 weeks and see how she is  tolerating.  2. Brain metastasis: -XRT 30 Gray in 15 fractions from 07/11/2020 through 07/29/2020.  3. Recurrent PE: -Continue Lovenox indefinitely.  No bleeding issues reported.  4. Chronic cough: -Dry cough is stable.   Orders placed this encounter:  Orders Placed This Encounter  Procedures  . CBC with Differential/Platelet  . Comprehensive metabolic panel     Derek Jack, MD [place] [phone]   I, Milinda Antis, am acting as a scribe for Dr. Sanda Linger.  I, Derek Jack MD, have reviewed the above documentation for accuracy and completeness, and I agree with the above.

## 2020-08-17 NOTE — Therapy (Signed)
weakness (generalized)     Problem List Patient Active Problem List   Diagnosis Date Noted   Prolapsed internal hemorrhoids, grade 3 04/18/2019   Anal polyp 04/18/2019   Malnutrition of moderate degree 12/03/2018   PEG tube malfunction ([place]) 12/02/2018   History of pulmonary embolism 12/02/2018   Protein-calorie malnutrition, severe 09/12/2018   Metastatic lung cancer (metastasis from lung to other site), left [facility])    Nausea and vomiting 09/11/2018   Weight loss 09/11/2018   Hypokalemia 09/09/2018   Brain metastases ([place]) 07/12/2018   Malignant pericardial effusion ([place]) 07/08/2018   HCAP (healthcare-associated pneumonia) 07/06/2018   Adenocarcinoma of lung, right ([place]) 07/06/2018   Adenocarcinoma, lung, left ([place]) 07/03/2018   Asthma 06/16/2018   Hypertension 06/16/2018   Diabetes mellitus without complication ([place]) 27/61/8485   DVT (deep venous thrombosis) ([place]) 06/16/2018   Pulmonary embolism during treatment with long-term anticoagulation therapy ([place]) 06/16/2018   Lung mass    Pulmonary emboli ([place])  04/24/2018   Routine screening for STI (sexually transmitted infection) 03/14/2016   2:31 PM, 08/17/20 Kaitlin [name] PT DPT  Physical Therapist with [facility]  [phone] [place], [place], [address] Phone: [phone]   Fax:  [phone]  Name: Kaitlin [name] MRN: [id number] Date of Birth: Aug 11, 1950  [place], [place], [address] Phone: [phone]   Fax:  [phone]  Physical Therapy Treatment  Patient Details  Name: Kaitlin [name] MRN: [id number] Date of Birth: July 15, 1950 Referring Provider (PT): Derek Jack MD   Encounter Date: 08/17/2020   PT End of Session - 08/17/20 1343    Visit Number 3    Number of Visits 8    Date for PT Re-Evaluation 08/30/20    Authorization Type [employer] medicare    Authorization Time Period no auth    PT Start Time 2376    PT Stop Time 2831    PT Time Calculation (min) 42 min    Equipment Utilized During Treatment Gait belt    Activity Tolerance Patient tolerated treatment well;Patient limited by fatigue    Behavior During Therapy [facility] for tasks assessed/performed           Past Medical History:  Diagnosis Date   Asthma    Cancer ([place]) 04/21/2018   Lung Cancer    Carpal tunnel syndrome    Cataract    left eye   Diabetes mellitus without complication ([place])    DVT (deep venous thrombosis) ([place])    Hypertension    Pneumonia 04/21/2018   Pulmonary embolism ([place]) 03/2018   Sepsis ([place])     Past Surgical History:  Procedure Laterality Date   ABDOMINAL HYSTERECTOMY     biopsy of right breast     ESOPHAGOGASTRODUODENOSCOPY (EGD) WITH PROPOFOL N/A 09/12/2018   Procedure: ESOPHAGOGASTRODUODENOSCOPY (EGD) WITH PROPOFOL (scope in 1017, scope out 1032);  Surgeon: Virl Cagey, MD;  Location: [facility];  Service: General;  Laterality: N/A;   FOOT SURGERY     implant left eye     INTRAOCULAR LENS INSERTION     IR GASTROSTOMY TUBE MOD SED  12/05/2018   PEG PLACEMENT N/A 09/12/2018   Procedure: PERCUTANEOUS ENDOSCOPIC GASTROSTOMY (PEG) PLACEMENT;  Surgeon: Virl Cagey, MD;  Location: [facility];  Service: General;  Laterality: N/A;    There were no vitals filed for this visit.   Subjective Assessment - 08/17/20 1343    Subjective Patient denies pain  currently. Says her Lt leg is still heavy    Pertinent History Stage 4 brain cancer, lung cancer, asthma, HTN    Limitations Standing;Walking;House hold activities    How long can you stand comfortably? 5-7 minutes    How long can you walk comfortably? Currently unable    Patient Stated Goals Get back to walking on my own    Currently in Pain? No/denies                             [facility] Adult PT Treatment/Exercise - 08/17/20 0001      Knee/Hip Exercises: Standing   Heel Raises Both;2 sets;10 reps    Hip Abduction Both;2 sets;10 reps    Forward Step Up Both;2 sets;5 reps;Hand Hold: 2;Step Height: 4"    Forward Step Up Limitations frequent verbal cueing for sequencing    Gait Training 120 feet with RW and WC follow   no rest break, verbal cues for posture and LT stride/ step through     Knee/Hip Exercises: Seated   Long Arc Quad Both;20 reps    Other Seated Knee/Hip Exercises heel/toe raises x20    Marching Both;20 reps  [place], [place], [address] Phone: [phone]   Fax:  [phone]  Physical Therapy Treatment  Patient Details  Name: Kaitlin [name] MRN: [id number] Date of Birth: July 15, 1950 Referring Provider (PT): Derek Jack MD   Encounter Date: 08/17/2020   PT End of Session - 08/17/20 1343    Visit Number 3    Number of Visits 8    Date for PT Re-Evaluation 08/30/20    Authorization Type [employer]HC medicare    Authorization Time Period no auth    PT Start Time 2376    PT Stop Time 2831    PT Time Calculation (min) 42 min    Equipment Utilized During Treatment Gait belt    Activity Tolerance Patient tolerated treatment well;Patient limited by fatigue    Behavior During Therapy [facility] for tasks assessed/performed           Past Medical History:  Diagnosis Date   Asthma    Cancer ([place]) 04/21/2018   Lung Cancer    Carpal tunnel syndrome    Cataract    left eye   Diabetes mellitus without complication ([place])    DVT (deep venous thrombosis) ([place])    Hypertension    Pneumonia 04/21/2018   Pulmonary embolism ([place]) 03/2018   Sepsis ([place])     Past Surgical History:  Procedure Laterality Date   ABDOMINAL HYSTERECTOMY     biopsy of right breast     ESOPHAGOGASTRODUODENOSCOPY (EGD) WITH PROPOFOL N/A 09/12/2018   Procedure: ESOPHAGOGASTRODUODENOSCOPY (EGD) WITH PROPOFOL (scope in 1017, scope out 1032);  Surgeon: Virl Cagey, MD;  Location: [facility];  Service: General;  Laterality: N/A;   FOOT SURGERY     implant left eye     INTRAOCULAR LENS INSERTION     IR GASTROSTOMY TUBE MOD SED  12/05/2018   PEG PLACEMENT N/A 09/12/2018   Procedure: PERCUTANEOUS ENDOSCOPIC GASTROSTOMY (PEG) PLACEMENT;  Surgeon: Virl Cagey, MD;  Location: [facility];  Service: General;  Laterality: N/A;    There were no vitals filed for this visit.   Subjective Assessment - 08/17/20 1343    Subjective Patient denies pain  currently. Says her Lt leg is still heavy    Pertinent History Stage 4 brain cancer, lung cancer, asthma, HTN    Limitations Standing;Walking;House hold activities    How long can you stand comfortably? 5-7 minutes    How long can you walk comfortably? Currently unable    Patient Stated Goals Get back to walking on my own    Currently in Pain? No/denies                             [facility] Adult PT Treatment/Exercise - 08/17/20 0001      Knee/Hip Exercises: Standing   Heel Raises Both;2 sets;10 reps    Hip Abduction Both;2 sets;10 reps    Forward Step Up Both;2 sets;5 reps;Hand Hold: 2;Step Height: 4"    Forward Step Up Limitations frequent verbal cueing for sequencing    Gait Training 120 feet with RW and WC follow   no rest break, verbal cues for posture and LT stride/ step through     Knee/Hip Exercises: Seated   Long Arc Quad Both;20 reps    Other Seated Knee/Hip Exercises heel/toe raises x20    Marching Both;20 reps

## 2020-08-19 ENCOUNTER — Encounter (HOSPITAL_COMMUNITY): Payer: Self-pay

## 2020-08-19 ENCOUNTER — Ambulatory Visit (HOSPITAL_COMMUNITY): Payer: Medicare Other

## 2020-08-19 ENCOUNTER — Other Ambulatory Visit: Payer: Self-pay

## 2020-08-19 DIAGNOSIS — R2689 Other abnormalities of gait and mobility: Secondary | ICD-10-CM

## 2020-08-19 DIAGNOSIS — M6281 Muscle weakness (generalized): Secondary | ICD-10-CM

## 2020-08-19 DIAGNOSIS — R262 Difficulty in walking, not elsewhere classified: Secondary | ICD-10-CM | POA: Diagnosis not present

## 2020-08-19 NOTE — Therapy (Signed)
[facility] [address] [place], [place], [address] Phone: [phone]   Fax:  [phone]  Physical Therapy Treatment  Patient Details  Name: Kaitlin [name] MRN: [id number] Date of Birth: 16-Jul-1950 Referring Provider (PT): Doreatha Massed MD   Encounter Date: 08/19/2020   PT End of Session - 08/19/20 1320    Visit Number 4    Number of Visits 8    Date for PT Re-Evaluation 08/30/20    Authorization Type UHC medicare    Authorization Time Period no auth    PT Start Time 1317    PT Stop Time 1358    PT Time Calculation (min) 41 min    Equipment Utilized During Treatment Gait belt    Activity Tolerance Patient limited by fatigue;Patient tolerated treatment well    Behavior During Therapy [facility] for tasks assessed/performed           Past Medical History:  Diagnosis Date  . Asthma   . Cancer (HCC) 04/21/2018   Lung Cancer   . Carpal tunnel syndrome   . Cataract    left eye  . Diabetes mellitus without complication (HCC)   . DVT (deep venous thrombosis) (HCC)   . Hypertension   . Pneumonia 04/21/2018  . Pulmonary embolism (HCC) 03/2018  . Sepsis [facility])     Past Surgical History:  Procedure Laterality Date  . ABDOMINAL HYSTERECTOMY    . biopsy of right breast    . ESOPHAGOGASTRODUODENOSCOPY (EGD) WITH PROPOFOL N/A 09/12/2018   Procedure: ESOPHAGOGASTRODUODENOSCOPY (EGD) WITH PROPOFOL (scope in 1017, scope out 1032);  Surgeon: Lucretia Roers, MD;  Location: [facility];  Service: General;  Laterality: N/A;  . FOOT SURGERY    . implant left eye    . INTRAOCULAR LENS INSERTION    . IR GASTROSTOMY TUBE MOD SED  12/05/2018  . PEG PLACEMENT N/A 09/12/2018   Procedure: PERCUTANEOUS ENDOSCOPIC GASTROSTOMY (PEG) PLACEMENT;  Surgeon: Lucretia Roers, MD;  Location: [facility];  Service: General;  Laterality: N/A;    There were no vitals filed for this visit.   Subjective Assessment - 08/19/20 1319    Subjective No reports of pain  today.  Lt LE feels weak    Pertinent History Stage 4 brain cancer, lung cancer, asthma, HTN    Patient Stated Goals Get back to walking on my own    Currently in Pain? No/denies                             [facility] Adult PT Treatment/Exercise - 08/19/20 0001      Exercises   Exercises Knee/Hip      Knee/Hip Exercises: Standing   Heel Raises Both;2 sets;10 reps    Hip Flexion 10 reps;Knee bent    Hip Flexion Limitations alternating march with heavy UE support during Lt LE WB    Hip Abduction Both;2 sets;10 reps    Gait Training RW, WC behind x 1102ft    Other Standing Knee Exercises Sidestep 1RT x2 inside // bars    Other Standing Knee Exercises partial tandem stance 2x 20" 1 HHA      Knee/Hip Exercises: Seated   Long Arc Quad Both;2 sets;10 reps    Long Arc Quad Weight 1 lbs.    Sit to [employer] 5 reps                    PT Short Term Goals -  08/10/20 1119      PT SHORT TERM GOAL #1   Title Patient will be independent with initial HEP and self-management strategies to improve functional outcomes    Time 2    Period Weeks    Status On-going    Target Date 08/16/20             PT Long Term Goals - 08/10/20 1120      PT LONG TERM GOAL #1   Title Patient will have equal to or > 4+/5 MMT throughout BLE to improve ability to perform functional mobility, stair ambulation and ADLs.    Time 4    Period Weeks    Status On-going      PT LONG TERM GOAL #2   Title Patient will be able to ambulate at least 75 feet using LRAD for improved functional mobility in home and short distances at appointments/ MD visits etc.    Time 4    Period Weeks    Status Partially Met      PT LONG TERM GOAL #3   Title Patient will be able to maintain standing balance for up to 10 minutes for improved ability to perform cooking, cleaning, grooming ADLs.    Time 4    Period Weeks    Status On-going      PT LONG TERM GOAL #4   Title Patient will report at least 60%  overall improvement in subjective complaint to indicate improvement in ability to perform ADLs.    Time 4    Period Weeks    Status On-going                 Plan - 08/19/20 1404    Clinical Impression Statement Pt limited by fatigue and increased SOB with minimal movements today.  Periodic therapeutic seated rest breaks complete thorugh session to catch her breath.  Pt required multimodal cueing for proper form wiht majority of session.  Added static balance training wiht frequent cueing for posture and to extend knees, required intermittent HHA during partial tandem stance.  No reports of pain through session, was limited by fatiuge.    Personal Factors and Comorbidities Comorbidity 3+    Comorbidities Stage 4 brain cancer, lung cancer, asthma, HTN    Examination-Activity Limitations Bathing;Bed Mobility;Stairs;Stand;Transfers;Locomotion Level    Stability/Clinical Decision Making Evolving/Moderate complexity    Clinical Decision Making Moderate    Rehab Potential Fair    PT Frequency 2x / week    PT Duration 4 weeks    PT Treatment/Interventions ADLs/Self Care Home Management;Ultrasound;Neuromuscular re-education;Compression bandaging;Visual/perceptual remediation/compensation;Scar mobilization;Parrafin;Fluidtherapy;Aquatic Therapy;Contrast Bath;Patient/family education;Passive range of motion;Spinal Manipulations;Dry needling;Joint Manipulations;Orthotic Fit/Training;Gait training;Biofeedback;DME Instruction;Canalith Repostioning;Cryotherapy;Stair training;Functional mobility training;Electrical Stimulation;Energy conservation;Other (comment);Splinting;Taping;Vasopneumatic Device;Manual techniques;Therapeutic activities;Iontophoresis 4mg /ml Dexamethasone;Therapeutic exercise;Moist Heat;Traction;Balance training;Manual lymph drainage;Vestibular    PT Next Visit Plan Continue to progress standing activity, balance and gait as tolerated.    PT Home Exercise Plan Eval: seated march, LAQs,  seated heel/ toe raises 3/23 sit to stands with assist    Consulted and Agree with Plan of Care Patient           Patient will benefit from skilled therapeutic intervention in order to improve the following deficits and impairments:  Abnormal gait,Decreased endurance,Hypomobility,Decreased activity tolerance,Decreased balance,Difficulty walking,Decreased mobility,Decreased strength,Postural dysfunction,Improper body mechanics  Visit Diagnosis: Other abnormalities of gait and mobility  Difficulty in walking, not elsewhere classified  Muscle weakness (generalized)     Problem List Patient Active Problem List   Diagnosis Date Noted  . Prolapsed  internal hemorrhoids, grade 3 04/18/2019  . Anal polyp 04/18/2019  . Malnutrition of moderate degree 12/03/2018  . PEG tube malfunction (HCC) 12/02/2018  . History of pulmonary embolism 12/02/2018  . Protein-calorie malnutrition, severe 09/12/2018  . Metastatic lung cancer (metastasis from lung to other site), left (HCC)   . Nausea and vomiting 09/11/2018  . Weight loss 09/11/2018  . Hypokalemia 09/09/2018  . Brain metastases (HCC) 07/12/2018  . Malignant pericardial effusion (HCC) 07/08/2018  . HCAP (healthcare-associated pneumonia) 07/06/2018  . Adenocarcinoma of lung, right (HCC) 07/06/2018  . Adenocarcinoma, lung, left (HCC) 07/03/2018  . Asthma 06/16/2018  . Hypertension 06/16/2018  . Diabetes mellitus without complication (HCC) 06/16/2018  . DVT (deep venous thrombosis) (HCC) 06/16/2018  . Pulmonary embolism during treatment with long-term anticoagulation therapy (HCC) 06/16/2018  . Lung mass   . Pulmonary emboli (HCC) 04/24/2018  . Routine screening for STI (sexually transmitted infection) 03/14/2016   Becky Sax, LPTA/CLT; CBIS [phone]  Juel Burrow 08/19/2020, 2:58 PM  [facility] [address] [place], [place], [address] Phone: [phone]   Fax:   [phone]  Name: ALELI BEED MRN: [id number] Date of Birth: 1950/08/10

## 2020-08-22 DIAGNOSIS — I10 Essential (primary) hypertension: Secondary | ICD-10-CM | POA: Diagnosis not present

## 2020-08-22 DIAGNOSIS — M79605 Pain in left leg: Secondary | ICD-10-CM | POA: Diagnosis not present

## 2020-08-22 DIAGNOSIS — C7931 Secondary malignant neoplasm of brain: Secondary | ICD-10-CM | POA: Diagnosis not present

## 2020-08-23 ENCOUNTER — Encounter (HOSPITAL_COMMUNITY): Payer: Self-pay | Admitting: Physical Therapy

## 2020-08-23 ENCOUNTER — Other Ambulatory Visit: Payer: Self-pay

## 2020-08-23 ENCOUNTER — Ambulatory Visit (HOSPITAL_COMMUNITY): Payer: Medicare Other | Admitting: Physical Therapy

## 2020-08-23 DIAGNOSIS — M6281 Muscle weakness (generalized): Secondary | ICD-10-CM

## 2020-08-23 DIAGNOSIS — R262 Difficulty in walking, not elsewhere classified: Secondary | ICD-10-CM

## 2020-08-23 DIAGNOSIS — R2689 Other abnormalities of gait and mobility: Secondary | ICD-10-CM

## 2020-08-23 NOTE — Therapy (Addendum)
[place] [address] [place], [place], [address] Phone: [phone]   Fax:  [phone]  Physical Therapy Treatment  Patient Details  Name: Kaitlin [name] MRN: [id number] Date of Birth: [birth year] Referring Provider (PT): Derek Jack MD   Encounter Date: 08/23/2020   PT End of Session - 08/23/20 1031    Visit Number 5    Number of Visits 8    Date for PT Re-Evaluation 08/30/20    Authorization Type UHC medicare    Authorization Time Period no auth    PT Start Time 1028    PT Stop Time 1112    PT Time Calculation (min) 44 min    Equipment Utilized During Treatment Gait belt    Activity Tolerance Patient limited by fatigue;Patient tolerated treatment well    Behavior During Therapy [facility] for tasks assessed/performed           Past Medical History:  Diagnosis Date  . Asthma   . Cancer ([place]) 04/21/2018   Lung Cancer   . Carpal tunnel syndrome   . Cataract    left eye  . Diabetes mellitus without complication ([place])   . DVT (deep venous thrombosis) ([place])   . Hypertension   . Pneumonia 04/21/2018  . Pulmonary embolism ([place]) 03/2018  . Sepsis [facility])     Past Surgical History:  Procedure Laterality Date  . ABDOMINAL HYSTERECTOMY    . biopsy of right breast    . ESOPHAGOGASTRODUODENOSCOPY (EGD) WITH PROPOFOL N/A 09/12/2018   Procedure: ESOPHAGOGASTRODUODENOSCOPY (EGD) WITH PROPOFOL (scope in 1017, scope out 1032);  Surgeon: Virl Cagey, MD;  Location: [facility];  Service: General;  Laterality: N/A;  . FOOT SURGERY    . implant left eye    . INTRAOCULAR LENS INSERTION    . IR GASTROSTOMY TUBE MOD SED  12/05/2018  . PEG PLACEMENT N/A 09/12/2018   Procedure: PERCUTANEOUS ENDOSCOPIC GASTROSTOMY (PEG) PLACEMENT;  Surgeon: Virl Cagey, MD;  Location: [facility];  Service: General;  Laterality: N/A;    There were no vitals filed for this visit.   Subjective Assessment - 08/23/20 1031    Subjective Patient reports no  new complaints. Says she is doing seated exercise at home. Uses walker sometimes.    Pertinent History Stage 4 brain cancer, lung cancer, asthma, HTN    Patient Stated Goals Get back to walking on my own    Currently in Pain? No/denies                             [facility] Adult PT Treatment/Exercise - 08/23/20 0001      Knee/Hip Exercises: Standing   Heel Raises Both;2 sets;10 reps    Hip Flexion Knee bent;2 sets;10 reps    Hip Flexion Limitations alternating march with HHA x 2    Hip Abduction Both;2 sets;10 reps    Hip Extension Both;2 sets;10 reps    Gait Training 50 feet with Mod A and WC follow, assist for LOB x 1    Other Standing Knee Exercises partial tandem stance 2x 30" 1 HHA      Knee/Hip Exercises: Seated   Other Seated Knee/Hip Exercises seated posture correction with cues for core activation and breathing 3 x 30"    Sit to Sand 2 sets;5 reps;with UE support   from standard chair  mechanics  Visit Diagnosis: Other abnormalities of gait and mobility  Difficulty in walking, not elsewhere classified  Muscle weakness (generalized)     Problem List Patient Active Problem List   Diagnosis Date Noted  . Prolapsed internal hemorrhoids, grade 3 04/18/2019  . Anal polyp 04/18/2019  . Malnutrition of moderate degree 12/03/2018  . PEG tube malfunction ([place]) 12/02/2018  . History of pulmonary embolism 12/02/2018  . Protein-calorie malnutrition, severe 09/12/2018  . Metastatic lung cancer (metastasis from lung to other site), left ([place])   . Nausea and vomiting 09/11/2018  . Weight loss 09/11/2018  . Hypokalemia 09/09/2018  . Brain metastases ([place]) 07/12/2018  . Malignant pericardial effusion ([place]) 07/08/2018  . HCAP (healthcare-associated pneumonia) 07/06/2018  . Adenocarcinoma of lung, right ([place]) 07/06/2018  . Adenocarcinoma, lung, left ([place]) 07/03/2018  . Asthma 06/16/2018  . Hypertension 06/16/2018  . Diabetes mellitus without complication ([place]) 15/94/5859  . DVT (deep venous thrombosis) ([place]) 06/16/2018  . Pulmonary embolism during treatment with long-term  anticoagulation therapy ([place]) 06/16/2018  . Lung mass   . Pulmonary emboli ([place]) 04/24/2018  . Routine screening for STI (sexually transmitted infection) 03/14/2016   1:26 PM, 08/23/20 Josue Hector PT DPT  Physical Therapist with [facility]  [phone] [place] [address] [place], [place], [address] Phone: [phone]   Fax:  [phone]  Name: Kaitlin [name] MRN: [id number] Date of Birth: 05/03/1951  mechanics  Visit Diagnosis: Other abnormalities of gait and mobility  Difficulty in walking, not elsewhere classified  Muscle weakness (generalized)     Problem List Patient Active Problem List   Diagnosis Date Noted  . Prolapsed internal hemorrhoids, grade 3 04/18/2019  . Anal polyp 04/18/2019  . Malnutrition of moderate degree 12/03/2018  . PEG tube malfunction ([place]) 12/02/2018  . History of pulmonary embolism 12/02/2018  . Protein-calorie malnutrition, severe 09/12/2018  . Metastatic lung cancer (metastasis from lung to other site), left ([place])   . Nausea and vomiting 09/11/2018  . Weight loss 09/11/2018  . Hypokalemia 09/09/2018  . Brain metastases ([place]) 07/12/2018  . Malignant pericardial effusion ([place]) 07/08/2018  . HCAP (healthcare-associated pneumonia) 07/06/2018  . Adenocarcinoma of lung, right ([place]) 07/06/2018  . Adenocarcinoma, lung, left ([place]) 07/03/2018  . Asthma 06/16/2018  . Hypertension 06/16/2018  . Diabetes mellitus without complication ([place]) 15/94/5859  . DVT (deep venous thrombosis) ([place]) 06/16/2018  . Pulmonary embolism during treatment with long-term  anticoagulation therapy ([place]) 06/16/2018  . Lung mass   . Pulmonary emboli ([place]) 04/24/2018  . Routine screening for STI (sexually transmitted infection) 03/14/2016   1:26 PM, 08/23/20 Josue Hector PT DPT  Physical Therapist with [facility]  [phone] [place] [address] [place], [place], [address] Phone: [phone]   Fax:  [phone]  Name: Kaitlin [name] MRN: [id number] Date of Birth: 05/03/1951

## 2020-08-24 ENCOUNTER — Encounter: Payer: Self-pay | Admitting: Urology

## 2020-08-24 ENCOUNTER — Telehealth: Payer: Self-pay

## 2020-08-24 ENCOUNTER — Other Ambulatory Visit: Payer: Self-pay

## 2020-08-24 NOTE — Telephone Encounter (Signed)
Spoke with patient in regards to telephone visit with Freeman Caldron PA on 08/31/20 @ 10:30am. Patient verbalized understanding that this is not an in person visit. Reviewed meaningful use questions. TM

## 2020-08-25 ENCOUNTER — Other Ambulatory Visit: Payer: Self-pay

## 2020-08-25 ENCOUNTER — Ambulatory Visit (HOSPITAL_COMMUNITY): Payer: Medicare Other | Admitting: Physical Therapy

## 2020-08-25 ENCOUNTER — Encounter (HOSPITAL_COMMUNITY): Payer: Self-pay | Admitting: Physical Therapy

## 2020-08-25 DIAGNOSIS — R2689 Other abnormalities of gait and mobility: Secondary | ICD-10-CM

## 2020-08-25 DIAGNOSIS — M6281 Muscle weakness (generalized): Secondary | ICD-10-CM

## 2020-08-25 DIAGNOSIS — I10 Essential (primary) hypertension: Secondary | ICD-10-CM | POA: Diagnosis not present

## 2020-08-25 DIAGNOSIS — R262 Difficulty in walking, not elsewhere classified: Secondary | ICD-10-CM | POA: Diagnosis not present

## 2020-08-25 DIAGNOSIS — J4540 Moderate persistent asthma, uncomplicated: Secondary | ICD-10-CM | POA: Diagnosis not present

## 2020-08-25 NOTE — Therapy (Signed)
[facility] [address] [place], [place], [address] Phone: [phone]   Fax:  [phone]  Physical Therapy Treatment  Patient Details  Name: Kaitlin [name] MRN: [id number] Date of Birth: 1950/10/04 Referring Provider (PT): Derek Jack MD   Encounter Date: 08/25/2020   PT End of Session - 08/25/20 1036    Visit Number 6    Number of Visits 8    Date for PT Re-Evaluation 08/30/20    Authorization Type UHC medicare    Authorization Time Period no auth    PT Start Time 1000    PT Stop Time 1038    PT Time Calculation (min) 38 min    Equipment Utilized During Treatment Gait belt    Activity Tolerance Patient limited by fatigue;Patient tolerated treatment well    Behavior During Therapy [facility] for tasks assessed/performed           Past Medical History:  Diagnosis Date  . Asthma   . Cancer ([place]) 04/21/2018   Lung Cancer   . Carpal tunnel syndrome   . Cataract    left eye  . Diabetes mellitus without complication ([place])   . DVT (deep venous thrombosis) ([place])   . Hypertension   . Pneumonia 04/21/2018  . Pulmonary embolism ([place]) 03/2018  . Sepsis [facility])     Past Surgical History:  Procedure Laterality Date  . ABDOMINAL HYSTERECTOMY    . biopsy of right breast    . ESOPHAGOGASTRODUODENOSCOPY (EGD) WITH PROPOFOL N/A 09/12/2018   Procedure: ESOPHAGOGASTRODUODENOSCOPY (EGD) WITH PROPOFOL (scope in 1017, scope out 1032);  Surgeon: Virl Cagey, MD;  Location: [facility];  Service: General;  Laterality: N/A;  . FOOT SURGERY    . implant left eye    . INTRAOCULAR LENS INSERTION    . IR GASTROSTOMY TUBE MOD SED  12/05/2018  . PEG PLACEMENT N/A 09/12/2018   Procedure: PERCUTANEOUS ENDOSCOPIC GASTROSTOMY (PEG) PLACEMENT;  Surgeon: Virl Cagey, MD;  Location: [facility];  Service: General;  Laterality: N/A;    There were no vitals filed for this visit.   Subjective Assessment - 08/25/20 1000    Subjective PT states that she  just gives out ealily; states part of the problem is her breathing    Pertinent History Stage 4 brain cancer, lung cancer, asthma, HTN    Limitations Standing;Walking;House hold activities    How long can you stand comfortably? 5-7 minutes    How long can you walk comfortably? Currently unable    Patient Stated Goals Get back to walking on my own    Currently in Pain? No/denies                  [facility] Adult PT Treatment/Exercise - 08/25/20 0001      Ambulation/Gait   Ambulation/Gait Yes    Ambulation/Gait Assistance 5: Supervision    Ambulation Distance (Feet) 150 Feet    Assistive device Rolling walker      Exercises   Exercises Knee/Hip      Knee/Hip Exercises: Standing   Heel Raises 15 reps    Forward Step Up Left;Hand Hold: 2;Step Height: 4"    Functional Squat 10 reps      Knee/Hip Exercises: Seated   Long Arc Quad B;10 reps    Long Arc Quad Weight 4 lbs.    Sit to [employer] 10 reps               Balance Exercises - 08/25/20 0001  Patient will benefit from skilled therapeutic intervention in order to improve the following deficits and impairments:  Abnormal gait,Decreased endurance,Hypomobility,Decreased activity tolerance,Decreased balance,Difficulty walking,Decreased mobility,Decreased strength,Postural dysfunction,Improper body mechanics  Visit Diagnosis: Other abnormalities of gait and mobility  Difficulty in walking, not elsewhere classified  Muscle weakness (generalized)     Problem List Patient Active Problem List   Diagnosis Date Noted  . Prolapsed internal hemorrhoids, grade 3 04/18/2019  . Anal polyp 04/18/2019  . Malnutrition of moderate degree 12/03/2018  . PEG tube malfunction ([place]) 12/02/2018  . History of pulmonary embolism 12/02/2018  . Protein-calorie malnutrition, severe 09/12/2018  . Metastatic lung cancer (metastasis from lung to other site), left ([place])   . Nausea and vomiting 09/11/2018  . Weight loss 09/11/2018  . Hypokalemia 09/09/2018  . Brain metastases ([place]) 07/12/2018  . Malignant pericardial effusion ([place]) 07/08/2018  . HCAP (healthcare-associated pneumonia) 07/06/2018  . Adenocarcinoma of lung, right ([place]) 07/06/2018  . Adenocarcinoma, lung, left ([place]) 07/03/2018  . Asthma 06/16/2018  . Hypertension 06/16/2018  . Diabetes mellitus without complication ([place]) 95/32/0233  . DVT (deep venous thrombosis) ([place]) 06/16/2018  . Pulmonary embolism during  treatment with long-term anticoagulation therapy ([place]) 06/16/2018  . Lung mass   . Pulmonary emboli ([place]) 04/24/2018  . Routine screening for STI (sexually transmitted infection) 03/14/2016    Rayetta Humphrey, PT CLT [phone] 08/25/2020, 10:50 AM  [facility] [address] [place], [place], [address] Phone: [phone]   Fax:  [phone]  Name: Kaitlin [name] MRN: [id number] Date of Birth: [birth year]  [facility] [address] [place], [place], [address] Phone: [phone]   Fax:  [phone]  Physical Therapy Treatment  Patient Details  Name: Kaitlin [name] MRN: [id number] Date of Birth: 1950/10/04 Referring Provider (PT): Derek Jack MD   Encounter Date: 08/25/2020   PT End of Session - 08/25/20 1036    Visit Number 6    Number of Visits 8    Date for PT Re-Evaluation 08/30/20    Authorization Type UHC medicare    Authorization Time Period no auth    PT Start Time 1000    PT Stop Time 1038    PT Time Calculation (min) 38 min    Equipment Utilized During Treatment Gait belt    Activity Tolerance Patient limited by fatigue;Patient tolerated treatment well    Behavior During Therapy [facility] for tasks assessed/performed           Past Medical History:  Diagnosis Date  . Asthma   . Cancer ([place]) 04/21/2018   Lung Cancer   . Carpal tunnel syndrome   . Cataract    left eye  . Diabetes mellitus without complication ([place])   . DVT (deep venous thrombosis) ([place])   . Hypertension   . Pneumonia 04/21/2018  . Pulmonary embolism ([place]) 03/2018  . Sepsis [facility])     Past Surgical History:  Procedure Laterality Date  . ABDOMINAL HYSTERECTOMY    . biopsy of right breast    . ESOPHAGOGASTRODUODENOSCOPY (EGD) WITH PROPOFOL N/A 09/12/2018   Procedure: ESOPHAGOGASTRODUODENOSCOPY (EGD) WITH PROPOFOL (scope in 1017, scope out 1032);  Surgeon: Virl Cagey, MD;  Location: [facility];  Service: General;  Laterality: N/A;  . FOOT SURGERY    . implant left eye    . INTRAOCULAR LENS INSERTION    . IR GASTROSTOMY TUBE MOD SED  12/05/2018  . PEG PLACEMENT N/A 09/12/2018   Procedure: PERCUTANEOUS ENDOSCOPIC GASTROSTOMY (PEG) PLACEMENT;  Surgeon: Virl Cagey, MD;  Location: [facility];  Service: General;  Laterality: N/A;    There were no vitals filed for this visit.   Subjective Assessment - 08/25/20 1000    Subjective PT states that she  just gives out ealily; states part of the problem is her breathing    Pertinent History Stage 4 brain cancer, lung cancer, asthma, HTN    Limitations Standing;Walking;House hold activities    How long can you stand comfortably? 5-7 minutes    How long can you walk comfortably? Currently unable    Patient Stated Goals Get back to walking on my own    Currently in Pain? No/denies                  [facility] Adult PT Treatment/Exercise - 08/25/20 0001      Ambulation/Gait   Ambulation/Gait Yes    Ambulation/Gait Assistance 5: Supervision    Ambulation Distance (Feet) 150 Feet    Assistive device Rolling walker      Exercises   Exercises Knee/Hip      Knee/Hip Exercises: Standing   Heel Raises 15 reps    Forward Step Up Left;Hand Hold: 2;Step Height: 4"    Functional Squat 10 reps      Knee/Hip Exercises: Seated   Long Arc Quad B;10 reps    Long Arc Quad Weight 4 lbs.    Sit to [employer] 10 reps               Balance Exercises - 08/25/20 0001

## 2020-08-30 ENCOUNTER — Ambulatory Visit (HOSPITAL_COMMUNITY): Payer: Medicare Other | Attending: Hematology | Admitting: Physical Therapy

## 2020-08-30 ENCOUNTER — Encounter (HOSPITAL_COMMUNITY): Payer: Self-pay | Admitting: Physical Therapy

## 2020-08-30 ENCOUNTER — Other Ambulatory Visit: Payer: Self-pay

## 2020-08-30 DIAGNOSIS — M6281 Muscle weakness (generalized): Secondary | ICD-10-CM | POA: Insufficient documentation

## 2020-08-30 DIAGNOSIS — R262 Difficulty in walking, not elsewhere classified: Secondary | ICD-10-CM | POA: Insufficient documentation

## 2020-08-30 DIAGNOSIS — R2689 Other abnormalities of gait and mobility: Secondary | ICD-10-CM | POA: Insufficient documentation

## 2020-08-30 NOTE — Therapy (Signed)
[facility] [address] [place], [place], [address] Phone: [phone]   Fax:  [phone]  Physical Therapy Treatment  Patient Details  Name: Kaitlin [name] MRN: [id number] Date of Birth: 02-28-1951 Referring Provider (PT): Derek Jack MD  Progress Note Reporting Period 08/02/20 to 08/30/20  See note below for Objective Data and Assessment of Progress/Goals.       Encounter Date: 08/30/2020   PT End of Session - 08/30/20 1045    Visit Number 7    Number of Visits 15    Date for PT Re-Evaluation 09/27/20    Authorization Type [employer] medicare    Authorization Time Period no auth    PT Start Time 1033    PT Stop Time 1128    PT Time Calculation (min) 55 min    Equipment Utilized During Treatment Gait belt    Activity Tolerance Patient limited by fatigue;Patient tolerated treatment well    Behavior During Therapy WFL for tasks assessed/performed           Past Medical History:  Diagnosis Date  . Asthma   . Cancer ([place]) 04/21/2018   Lung Cancer   . Carpal tunnel syndrome   . Cataract    left eye  . Diabetes mellitus without complication ([place])   . DVT (deep venous thrombosis) ([place])   . Hypertension   . Pneumonia 04/21/2018  . Pulmonary embolism ([place]) 03/2018  . Sepsis [facility])     Past Surgical History:  Procedure Laterality Date  . ABDOMINAL HYSTERECTOMY    . biopsy of right breast    . ESOPHAGOGASTRODUODENOSCOPY (EGD) WITH PROPOFOL N/A 09/12/2018   Procedure: ESOPHAGOGASTRODUODENOSCOPY (EGD) WITH PROPOFOL (scope in 1017, scope out 1032);  Surgeon: Virl Cagey, MD;  Location: [facility];  Service: General;  Laterality: N/A;  . FOOT SURGERY    . implant left eye    . INTRAOCULAR LENS INSERTION    . IR GASTROSTOMY TUBE MOD SED  12/05/2018  . PEG PLACEMENT N/A 09/12/2018   Procedure: PERCUTANEOUS ENDOSCOPIC GASTROSTOMY (PEG) PLACEMENT;  Surgeon: Virl Cagey, MD;  Location: [facility];  Service: General;  Laterality:  N/A;    There were no vitals filed for this visit.   Subjective Assessment - 08/30/20 1044    Subjective Patient says she is doing much better. She is walking more and doing more around the house. Her daughter says she is able to stand and wash the dishes now. Patient says she has been practicing walking short distance without AD. She states she feels about 75% improved since starting therapy. She says she is still having trouble with her LT knee feeling weak at times.    Pertinent History Stage 4 brain cancer, lung cancer, asthma, HTN    Limitations Standing;Walking;House hold activities    How long can you stand comfortably? 10 minutes    How long can you walk comfortably? up to 150 feet    Patient Stated Goals Get back to walking on my own    Currently in Pain? No/denies              [facility] PT Assessment - 08/30/20 0001      Assessment   Medical Diagnosis Weakness    Referring Provider (PT) Derek Jack MD    Onset Date/Surgical Date 06/25/20      Balance Screen   Has the patient fallen in the past 6 months No   Not since starting therapy     Home  [facility]  [phone] [address] [place], [place], [address] Phone: [phone]   Fax:  [phone]  Name: Kaitlin [name] MRN: [id number] Date of Birth: 01-30-1951  [facility] [address] [place], [place], [address] Phone: [phone]   Fax:  [phone]  Physical Therapy Treatment  Patient Details  Name: Kaitlin [name] MRN: [id number] Date of Birth: 02-28-1951 Referring Provider (PT): Derek Jack MD  Progress Note Reporting Period 08/02/20 to 08/30/20  See note below for Objective Data and Assessment of Progress/Goals.       Encounter Date: 08/30/2020   PT End of Session - 08/30/20 1045    Visit Number 7    Number of Visits 15    Date for PT Re-Evaluation 09/27/20    Authorization Type [employer] medicare    Authorization Time Period no auth    PT Start Time 1033    PT Stop Time 1128    PT Time Calculation (min) 55 min    Equipment Utilized During Treatment Gait belt    Activity Tolerance Patient limited by fatigue;Patient tolerated treatment well    Behavior During Therapy WFL for tasks assessed/performed           Past Medical History:  Diagnosis Date  . Asthma   . Cancer ([place]) 04/21/2018   Lung Cancer   . Carpal tunnel syndrome   . Cataract    left eye  . Diabetes mellitus without complication ([place])   . DVT (deep venous thrombosis) ([place])   . Hypertension   . Pneumonia 04/21/2018  . Pulmonary embolism ([place]) 03/2018  . Sepsis [facility])     Past Surgical History:  Procedure Laterality Date  . ABDOMINAL HYSTERECTOMY    . biopsy of right breast    . ESOPHAGOGASTRODUODENOSCOPY (EGD) WITH PROPOFOL N/A 09/12/2018   Procedure: ESOPHAGOGASTRODUODENOSCOPY (EGD) WITH PROPOFOL (scope in 1017, scope out 1032);  Surgeon: Virl Cagey, MD;  Location: [facility];  Service: General;  Laterality: N/A;  . FOOT SURGERY    . implant left eye    . INTRAOCULAR LENS INSERTION    . IR GASTROSTOMY TUBE MOD SED  12/05/2018  . PEG PLACEMENT N/A 09/12/2018   Procedure: PERCUTANEOUS ENDOSCOPIC GASTROSTOMY (PEG) PLACEMENT;  Surgeon: Virl Cagey, MD;  Location: [facility];  Service: General;  Laterality:  N/A;    There were no vitals filed for this visit.   Subjective Assessment - 08/30/20 1044    Subjective Patient says she is doing much better. She is walking more and doing more around the house. Her daughter says she is able to stand and wash the dishes now. Patient says she has been practicing walking short distance without AD. She states she feels about 75% improved since starting therapy. She says she is still having trouble with her LT knee feeling weak at times.    Pertinent History Stage 4 brain cancer, lung cancer, asthma, HTN    Limitations Standing;Walking;House hold activities    How long can you stand comfortably? 10 minutes    How long can you walk comfortably? up to 150 feet    Patient Stated Goals Get back to walking on my own    Currently in Pain? No/denies              [facility] PT Assessment - 08/30/20 0001      Assessment   Medical Diagnosis Weakness    Referring Provider (PT) Derek Jack MD    Onset Date/Surgical Date 06/25/20      Balance Screen   Has the patient fallen in the past 6 months No   Not since starting therapy     Home  [facility]  [phone] [place], [place], [address] Phone: [phone]   Fax:  [phone]  Name: Kaitlin [name] MRN: [id number] Date of Birth: 01-30-1951

## 2020-08-31 ENCOUNTER — Ambulatory Visit
Admission: RE | Admit: 2020-08-31 | Discharge: 2020-08-31 | Disposition: A | Discharge status: Home / Self Care | Payer: Medicare Other | Source: Ambulatory Visit | Attending: Urology | Admitting: Urology

## 2020-08-31 DIAGNOSIS — C7931 Secondary malignant neoplasm of brain: Secondary | ICD-10-CM

## 2020-08-31 NOTE — Progress Notes (Signed)
Radiation Oncology         [phone] ________________________________  Name: Kaitlin [name] MRN: [id number]  Date: 08/31/2020  DOB: 1951-04-02  End of Treatment Note  Diagnosis:   69 yowoman withmetastatic NSCLC of the left lower lobe lung with recurrent brain metastases.     Indication for treatment:  Palliation       Radiation treatment dates:   07/11/20 - 07/29/20  Site/dose:   The whole brain was re-treated to 30 Gy in 15 fractions of 2 Gy.  Beams/energy:   Right and Left radiation fields were treated using 6 MV X-rays with custom MLC collimation to shield the eyes and face.  The patient was immobilized with a thermoplastic mask and isocenter was verified with weekly port films.  Narrative: The patient tolerated radiation treatment relatively well.   She did experience some issues with headaches.  She also reported left lower extremity weakness and complained of feeling like she was dragging the left leg as well as some left upper extremity/hand weakness where she was unable to grip a pen.  However, by the end of treatment, she was without complaints.  Plan: The patient has completed radiation treatment. The patient will return to radiation oncology clinic for routine followup in one month. I advised them to call or return sooner if they have any questions or concerns related to their recovery or treatment. ________________________________  Artist Pais. Kathrynn Running, M.D.

## 2020-08-31 NOTE — Progress Notes (Signed)
Radiation Oncology         [phone] ________________________________  Name: Kaitlin [name] MRN: [id number]  Date: 08/31/2020  DOB: 1951-05-17  Post Treatment Note  CC: [name], MD  [name], MD  Diagnosis:   69 yowomanwithmetastatic NSCLC of the left lower lobe lung withrecurrentbrain metastases.  Interval Since Last Radiation:  4.5 weeks  07/11/20 - 07/29/20:   The whole brain was re-treated to 30 Gy in 15 fractions of 2 Gy.   Narrative:  The patient returns today for routine follow-up.  She tolerated radiation treatment relatively well.  She did experience some issues with headaches.  She also reported left lower extremity weakness and complained of feeling like she was dragging the left leg as well as some left upper extremity/hand weakness where she was unable to grip a pen.  However, by the end of treatment, she was without complaints                            On review of systems, the patient states that she is doing well in general.  She specifically denies any headaches, nausea, vomiting, changes in visual or auditory acuity, dizziness, imbalance, tremor or seizure activity.  She has continued with mild fatigue and weakness in the left lower leg but is working with physical therapy and feels like she is gradually making progress.  Otherwise, she has no complaints.  She had a follow-up visit with Dr. Ellin Saba on 08/17/2020 and has continued taking Tagrisso without any ill side effects.  She reports a healthy appetite and is maintaining her weight.  Overall, she is quite pleased with her progress to date.  Her most recent restaging PET scan from 07/07/2020 was without evidence of disease recurrence or metastatic disease outside the brain.  Stable radiation changes were noted in the lungs.  ALLERGIES:  is allergic to banana and aspirin.  Meds: Current Outpatient Medications  Medication Sig Dispense Refill  . albuterol (PROVENTIL HFA;VENTOLIN HFA) 108 (90 BASE) MCG/ACT  inhaler Inhale 2 puffs into the lungs every 6 (six) hours as needed for wheezing.    Marland Kitchen amLODipine (NORVASC) 10 MG tablet Take 10 mg by mouth daily.    Marland Kitchen amLODipine (NORVASC) 5 MG tablet Take 10 mg by mouth daily.    Marland Kitchen atorvastatin (LIPITOR) 20 MG tablet Take 20 mg by mouth daily.    . benzonatate (TESSALON) 100 MG capsule Take 1 capsule (100 mg total) by mouth 3 (three) times daily as needed for cough. 30 capsule 0  . Colchicine (MITIGARE) 0.6 MG CAPS Take 0.6 mg by mouth daily as needed. 15 capsule 3  . dexamethasone (DECADRON) 4 MG tablet Take 1 tablet (4 mg total) by mouth 2 (two) times daily. 20 tablet 0  . enoxaparin (LOVENOX) 150 MG/ML injection Inject 1 mL (150 mg total) into the skin daily. 0 mL   . FLOVENT HFA 44 MCG/ACT inhaler Inhale 2 puffs into the lungs daily.     Marland Kitchen HYDROcodone-homatropine (HYCODAN) 5-1.5 MG/5ML syrup Take 5 mLs by mouth every 6 (six) hours as needed for cough. 120 mL 0  . hydrocortisone (ANUSOL-HC) 2.5 % rectal cream     . latanoprost (XALATAN) 0.005 % ophthalmic solution Place 1 drop into both eyes in the morning and at bedtime.     . meclizine (ANTIVERT) 25 MG tablet Take 25 mg by mouth as needed.     . metoCLOPramide (REGLAN) 10 MG tablet Take 1  tablet (10 mg total) by mouth every 8 (eight) hours as needed for refractory nausea / vomiting. 30 tablet 0  . osimertinib mesylate (TAGRISSO) 40 MG tablet Take 1 tablet (40 mg total) by mouth daily. 30 tablet 2  . oxyCODONE-acetaminophen (PERCOCET/ROXICET) 5-325 MG tablet Take 1-2 tablets by mouth as needed.     . pantoprazole (PROTONIX) 40 MG tablet TAKE 1 TABLET BY MOUTH ONCE DAILY. 30 tablet 0  . potassium chloride (KLOR-CON) 10 MEQ tablet TAKE (2) TABLETS BY MOUTH TWICE DAILY. 120 tablet 3  . predniSONE (DELTASONE) 20 MG tablet Take 20 mg by mouth 2 (two) times daily.    . prochlorperazine (COMPAZINE) 10 MG tablet TAKE (1) TABLET BY MOUTH EVERY SIX HOURS AS NEEDED FOR NAUSEA OR VOMITING. 30 tablet 10  .  triamcinolone cream (KENALOG) 0.1 % Apply 1 application topically as needed.    . zolpidem (AMBIEN CR) 6.25 MG CR tablet Take 1 tablet (6.25 mg total) by mouth at bedtime as needed for sleep. 30 tablet 0   No current facility-administered medications for this encounter.    Physical Findings:  vitals were not taken for this visit.   /Unable to assess due to telephone follow-up visit format.  Lab Findings: Lab Results  Component Value Date   WBC 4.4 08/17/2020   HGB 15.1 (H) 08/17/2020   HCT 47.0 (H) 08/17/2020   MCV 87.0 08/17/2020   PLT 183 08/17/2020     Radiographic Findings: No results found.  Impression/Plan: [age] yowomanwithmetastatic NSCLC of the left lower lobe lung withrecurrentbrain metastases. She appears to have recovered well from her recent course of whole brain radiotherapy and is currently without complaints.  We discussed the plan for a posttreatment brain MRI scan in approximately 2 months and pending this scan is clear, we will resume routine monitoring with serial MRI brain scans every 3 months.  I will plan to connect with the patient by phone after each scan to review results and recommendations from the multidisciplinary brain conference.  She knows that she is welcome to call at anytime with any questions or concerns in the interim.  She will also continue in routine follow-up under the care and direction of Dr. Ellin Saba for continued management of her systemic disease.  The current plan is to continue her systemic therapy with Tagrisso and will attempt to increase the dose slowly since she has not tolerated higher doses well in the past.     Marguarite Arbour, PA-C

## 2020-08-31 NOTE — Addendum Note (Signed)
Encounter addended by: Freeman Caldron, PA-C on: 08/31/2020 11:34 AM  Actions taken: Clinical Note Signed

## 2020-09-01 ENCOUNTER — Other Ambulatory Visit: Payer: Self-pay

## 2020-09-01 ENCOUNTER — Encounter (HOSPITAL_COMMUNITY): Payer: Self-pay | Admitting: Physical Therapy

## 2020-09-01 ENCOUNTER — Ambulatory Visit (HOSPITAL_COMMUNITY): Payer: Medicare Other | Admitting: Physical Therapy

## 2020-09-01 DIAGNOSIS — R262 Difficulty in walking, not elsewhere classified: Secondary | ICD-10-CM

## 2020-09-01 DIAGNOSIS — R2689 Other abnormalities of gait and mobility: Secondary | ICD-10-CM

## 2020-09-01 DIAGNOSIS — M6281 Muscle weakness (generalized): Secondary | ICD-10-CM | POA: Diagnosis not present

## 2020-09-01 NOTE — Therapy (Signed)
[facility] [address] [place], [place], [address] Phone: [phone]   Fax:  [phone]  Physical Therapy Treatment  Patient Details  Name: Kaitlin [name] MRN: [id number] Date of Birth: December 03, 1950 Referring Provider (PT): Doreatha Massed MD   Encounter Date: 09/01/2020   PT End of Session - 09/01/20 1042    Visit Number 8    Number of Visits 15    Date for PT Re-Evaluation 09/27/20    Authorization Type [employer]HC medicare    Authorization Time Period no auth    PT Start Time 1038    PT Stop Time 1118    PT Time Calculation (min) 40 min    Equipment Utilized During Treatment Gait belt    Activity Tolerance Patient limited by fatigue;Patient tolerated treatment well    Behavior During Therapy [facility] for tasks assessed/performed           Past Medical History:  Diagnosis Date  . Asthma   . Cancer ([facility]) 04/21/2018   Lung Cancer   . Carpal tunnel syndrome   . Cataract    left eye  . Diabetes mellitus without complication ([facility])   . DVT (deep venous thrombosis) ([facility])   . Hypertension   . Pneumonia 04/21/2018  . Pulmonary embolism ([facility]) 03/2018  . Sepsis [facility])     Past Surgical History:  Procedure Laterality Date  . ABDOMINAL HYSTERECTOMY    . biopsy of right breast    . ESOPHAGOGASTRODUODENOSCOPY (EGD) WITH PROPOFOL N/A 09/12/2018   Procedure: ESOPHAGOGASTRODUODENOSCOPY (EGD) WITH PROPOFOL (scope in 1017, scope out 1032);  Surgeon: Lucretia Roers, MD;  Location: [facility];  Service: General;  Laterality: N/A;  . FOOT SURGERY    . implant left eye    . INTRAOCULAR LENS INSERTION    . IR GASTROSTOMY TUBE MOD SED  12/05/2018  . PEG PLACEMENT N/A 09/12/2018   Procedure: PERCUTANEOUS ENDOSCOPIC GASTROSTOMY (PEG) PLACEMENT;  Surgeon: Lucretia Roers, MD;  Location: [facility];  Service: General;  Laterality: N/A;    There were no vitals filed for this visit.   Subjective Assessment - 09/01/20 1041    Subjective Patient reports  wihtout complaint. She has been doing more walking with walker.    Pertinent History Stage 4 brain cancer, lung cancer, asthma, HTN    Limitations Standing;Walking;House hold activities    How long can you stand comfortably? 10 minutes    How long can you walk comfortably? up to 150 feet    Patient Stated Goals Get back to walking on my own    Currently in Pain? No/denies                             [facility] Adult PT Treatment/Exercise - 09/01/20 0001      Knee/Hip Exercises: Standing   Heel Raises Both;2 sets;10 reps    Knee Flexion Both;2 sets;10 reps    Hip Abduction Both;2 sets;10 reps    Hip Extension Both;2 sets;10 reps    Forward Step Up Both;1 set;10 reps;Hand Hold: 2;Step Height: 4"    Other Standing Knee Exercises partial tandem stance 2 x 20"      Knee/Hip Exercises: Seated   Long Arc Quad Both;2 sets;10 reps;Weights    Long Arc Quad Weight 3 lbs.    Sit to Sand 10 reps;with UE support  PT Short Term Goals - 08/30/20 1156      PT SHORT TERM GOAL #1   Title Patient will be independent with initial HEP and self-management strategies to improve functional outcomes    Baseline Demos good return    Time 2    Period Weeks    Status Achieved    Target Date 08/16/20             PT Long Term Goals - 08/30/20 1156      PT LONG TERM GOAL #1   Title Patient will have equal to or > 4+/5 MMT throughout BLE to improve ability to perform functional mobility, stair ambulation and ADLs.    Baseline See MMT    Time 4    Period Weeks    Status Partially Met      PT LONG TERM GOAL #2   Title Patient will be able to ambulate at least 75 feet using LRAD for improved functional mobility in home and short distances at appointments/ MD visits etc.    Baseline Able to ambualate 240 feet today with RW and verbal cues for LLE step through    Time 4    Period Weeks    Status Achieved      PT LONG TERM GOAL #3   Title Patient will be  able to maintain standing balance for up to 10 minutes for improved ability to perform cooking, cleaning, grooming ADLs.    Baseline Reports standing >10 minutes at home    Time 4    Period Weeks    Status Achieved      PT LONG TERM GOAL #4   Title Patient will report at least 60% overall improvement in subjective complaint to indicate improvement in ability to perform ADLs.    Baseline Reports 75% improvement in mobility since starting therapy    Time 4    Period Weeks    Status Achieved                 Plan - 09/01/20 1116    Clinical Impression Statement Patient demos increased fatigue today. She required increased breaks for rest today. Graded activity according to patient intolerance. Patient required verbal cues for improved upright posturing with standing activity and sequencing with step ups. Patient continues to be challenged with static balance. Continued weighted LAQs for quad strengthening to improved tolerance with standing activity. Patient will continue to benefit from skilled therapy services to reduce limitations and improve functional ability.    Personal Factors and Comorbidities Comorbidity 3+    Comorbidities Stage 4 brain cancer, lung cancer, asthma, HTN    Examination-Activity Limitations Bathing;Bed Mobility;Stairs;Stand;Transfers;Locomotion Level    Examination-Participation Restrictions Cleaning;Community Activity;Laundry;Other;Yard Work    Conservation officer, historic buildings Evolving/Moderate complexity    Rehab Potential Fair    PT Frequency 2x / week    PT Duration 4 weeks    PT Treatment/Interventions ADLs/Self Care Home Management;Ultrasound;Neuromuscular re-education;Compression bandaging;Visual/perceptual remediation/compensation;Scar mobilization;Parrafin;Fluidtherapy;Aquatic Therapy;Contrast Bath;Patient/family education;Passive range of motion;Spinal Manipulations;Dry needling;Joint Manipulations;Orthotic Fit/Training;Gait training;Biofeedback;DME  Instruction;Canalith Repostioning;Cryotherapy;Stair training;Functional mobility training;Electrical Stimulation;Energy conservation;Other (comment);Splinting;Taping;Vasopneumatic Device;Manual techniques;Therapeutic activities;Iontophoresis 4mg /ml Dexamethasone;Therapeutic exercise;Moist Heat;Traction;Balance training;Manual lymph drainage;Vestibular    PT Next Visit Plan Continue to progress standing posture, quad strength and static balance. Add step ups, NBOS, band rows.    PT Home Exercise Plan Eval: seated march, LAQs, seated heel/ toe raises 3/23 sit to stands with assist; 3/31 [id number]    Consulted and Agree with Plan of Care Patient    Family Member Consulted Daughter  Patient will benefit from skilled therapeutic intervention in order to improve the following deficits and impairments:  Abnormal gait,Decreased endurance,Hypomobility,Decreased activity tolerance,Decreased balance,Difficulty walking,Decreased mobility,Decreased strength,Postural dysfunction,Improper body mechanics  Visit Diagnosis: Other abnormalities of gait and mobility  Difficulty in walking, not elsewhere classified  Muscle weakness (generalized)     Problem List Patient Active Problem List   Diagnosis Date Noted  . Prolapsed internal hemorrhoids, grade 3 04/18/2019  . Anal polyp 04/18/2019  . Malnutrition of moderate degree 12/03/2018  . PEG tube malfunction ([facility]) 12/02/2018  . History of pulmonary embolism 12/02/2018  . Protein-calorie malnutrition, severe 09/12/2018  . Metastatic lung cancer (metastasis from lung to other site), left (HCC)   . Nausea and vomiting 09/11/2018  . Weight loss 09/11/2018  . Hypokalemia 09/09/2018  . Brain metastases ([facility]) 07/12/2018  . Malignant pericardial effusion ([facility]CC) 07/08/2018  . HCAP (healthcare-associated pneumonia) 07/06/2018  . Adenocarcinoma of lung, right (HCC) 07/06/2018  . Adenocarcinoma, lung, left (HCC) 07/03/2018  . Asthma 06/16/2018  .  Hypertension 06/16/2018  . Diabetes mellitus without complication ([facility]) 06/16/2018  . DVT (deep venous thrombosis) ([facility]) 06/16/2018  . Pulmonary embolism during treatment with long-term anticoagulation therapy ([facility]CC) 06/16/2018  . Lung mass   . Pulmonary emboli ([facility]CC) 04/24/2018  . Routine screening for STI (sexually transmitted infection) 03/14/2016    4:41 PM, 09/01/20 Georges Lynch PT DPT  Physical Therapist with [place]  [facility]  [phone]   [facility] [address] [place], [place], [address] Phone: [phone]   Fax:  [phone]  Name: RUTILA MCCONAUGHEY MRN: [id number] Date of Birth: [birth year]

## 2020-09-02 DIAGNOSIS — H2513 Age-related nuclear cataract, bilateral: Secondary | ICD-10-CM | POA: Diagnosis not present

## 2020-09-02 DIAGNOSIS — H524 Presbyopia: Secondary | ICD-10-CM | POA: Diagnosis not present

## 2020-09-06 ENCOUNTER — Other Ambulatory Visit: Payer: Self-pay | Admitting: Radiation Therapy

## 2020-09-06 ENCOUNTER — Other Ambulatory Visit (HOSPITAL_COMMUNITY): Payer: Self-pay | Admitting: Surgery

## 2020-09-06 ENCOUNTER — Inpatient Hospital Stay (HOSPITAL_COMMUNITY): Payer: Medicare Other

## 2020-09-06 DIAGNOSIS — C7931 Secondary malignant neoplasm of brain: Secondary | ICD-10-CM

## 2020-09-06 DIAGNOSIS — C3492 Malignant neoplasm of unspecified part of left bronchus or lung: Secondary | ICD-10-CM

## 2020-09-06 MED ORDER — OSIMERTINIB MESYLATE 40 MG PO TABS: 80.0000 mg | ORAL_TABLET | Freq: Every day | ORAL | 3 refills | Status: DC

## 2020-09-06 NOTE — Telephone Encounter (Signed)
Chart reviewed.Tagrisso refilled per Dr. Delton Coombes

## 2020-09-13 ENCOUNTER — Ambulatory Visit (HOSPITAL_COMMUNITY): Payer: Medicare HMO | Admitting: Hematology

## 2020-09-14 ENCOUNTER — Other Ambulatory Visit (HOSPITAL_COMMUNITY): Payer: Self-pay | Admitting: *Deleted

## 2020-09-14 ENCOUNTER — Other Ambulatory Visit: Payer: Self-pay

## 2020-09-14 ENCOUNTER — Inpatient Hospital Stay (HOSPITAL_COMMUNITY): Payer: Medicare Other | Attending: Hematology | Admitting: Hematology

## 2020-09-14 ENCOUNTER — Encounter (HOSPITAL_COMMUNITY): Payer: Self-pay | Admitting: Hematology

## 2020-09-14 ENCOUNTER — Inpatient Hospital Stay (HOSPITAL_COMMUNITY): Payer: Medicare Other

## 2020-09-14 VITALS — BP 135/89 | HR 110 | Temp 97.2°F | Resp 18 | Wt 197.5 lb

## 2020-09-14 DIAGNOSIS — C7949 Secondary malignant neoplasm of other parts of nervous system: Secondary | ICD-10-CM | POA: Diagnosis not present

## 2020-09-14 DIAGNOSIS — Z7901 Long term (current) use of anticoagulants: Secondary | ICD-10-CM | POA: Insufficient documentation

## 2020-09-14 DIAGNOSIS — C3490 Malignant neoplasm of unspecified part of unspecified bronchus or lung: Secondary | ICD-10-CM | POA: Diagnosis not present

## 2020-09-14 DIAGNOSIS — C7931 Secondary malignant neoplasm of brain: Secondary | ICD-10-CM | POA: Diagnosis not present

## 2020-09-14 DIAGNOSIS — C3492 Malignant neoplasm of unspecified part of left bronchus or lung: Secondary | ICD-10-CM | POA: Diagnosis not present

## 2020-09-14 DIAGNOSIS — I2699 Other pulmonary embolism without acute cor pulmonale: Secondary | ICD-10-CM | POA: Diagnosis not present

## 2020-09-14 LAB — CBC WITH DIFFERENTIAL/PLATELET
Abs Immature Granulocytes: 0.03 10*3/uL (ref 0.00–0.07)
Basophils Absolute: 0 10*3/uL (ref 0.0–0.1)
Basophils Relative: 1 %
Eosinophils Absolute: 0.4 10*3/uL (ref 0.0–0.5)
Eosinophils Relative: 8 %
HCT: 45.8 % (ref 36.0–46.0)
Hemoglobin: 14.5 g/dL (ref 12.0–15.0)
Immature Granulocytes: 1 %
Lymphocytes Relative: 18 %
Lymphs Abs: 0.8 10*3/uL (ref 0.7–4.0)
MCH: 27.8 pg (ref 26.0–34.0)
MCHC: 31.7 g/dL (ref 30.0–36.0)
MCV: 87.9 fL (ref 80.0–100.0)
Monocytes Absolute: 0.5 10*3/uL (ref 0.1–1.0)
Monocytes Relative: 10 %
Neutro Abs: 3 10*3/uL (ref 1.7–7.7)
Neutrophils Relative %: 62 %
Platelets: 184 10*3/uL (ref 150–400)
RBC: 5.21 MIL/uL — ABNORMAL HIGH (ref 3.87–5.11)
RDW: 14.6 % (ref 11.5–15.5)
WBC: 4.8 10*3/uL (ref 4.0–10.5)
nRBC: 0 % (ref 0.0–0.2)

## 2020-09-14 LAB — COMPREHENSIVE METABOLIC PANEL
ALT: 19 U/L (ref 0–44)
AST: 17 U/L (ref 15–41)
Albumin: 4 g/dL (ref 3.5–5.0)
Alkaline Phosphatase: 70 U/L (ref 38–126)
Anion gap: 10 (ref 5–15)
BUN: 12 mg/dL (ref 8–23)
CO2: 23 mmol/L (ref 22–32)
Calcium: 9.7 mg/dL (ref 8.9–10.3)
Chloride: 105 mmol/L (ref 98–111)
Creatinine, Ser: 0.96 mg/dL (ref 0.44–1.00)
GFR, Estimated: >60 mL/min (ref >60)
Glucose, Bld: 81 mg/dL (ref 70–99)
Potassium: 3.9 mmol/L (ref 3.5–5.1)
Sodium: 138 mmol/L (ref 135–145)
Total Bilirubin: 0.7 mg/dL (ref 0.3–1.2)
Total Protein: 7.5 g/dL (ref 6.5–8.1)

## 2020-09-14 LAB — MAGNESIUM: Magnesium: 2 mg/dL (ref 1.7–2.4)

## 2020-09-14 NOTE — Progress Notes (Signed)
[facility] [address], [place] [address]   CLINIC:  Medical Oncology/Hematology  PCP:  [name], MD [address] / [place] [address] [phone]   REASON FOR VISIT:  Follow-up for metastatic left lung cancer to brain  PRIOR THERAPY:  1. Left lung RT 30 Gy in 10 fractions from 07/16/2018 to 07/29/2018. 2. Whole brain RT 30 Gy in 10 fractions from 07/16/2018 to 07/29/2018. 3. Whole brain RT 30 Gy in 15 fractions from 07/12/2020 to 07/29/2020.  NGS Results: Guardant 360 EGFR exon 19 deletion  CURRENT THERAPY: Tagrisso 80 mg daily  BRIEF ONCOLOGIC HISTORY:  Oncology History   No history exists.    CANCER STAGING: Cancer Staging No matching staging information was found for the patient.  INTERVAL HISTORY:  Kaitlin [name], a 70 y.o. female, returns for routine follow-up of her metastatic left lung cancer to brain. Shakara was last seen on 08/17/2020.   Today she is accompanied by her daughter and she reports feeling well. She has been tolerating the Tagrisso 80 mg well and denies having any more fatigue, N/V/D; she has not taken any Tagrisso for the past 1 week. She stopped taking Decadron. She is starting to walk with a walker now since her left leg strength is improving; she is not going to PT anymore. She also complains of having decreased hearing with sensation of water in both ears.   REVIEW OF SYSTEMS:  Review of Systems  Constitutional: Negative for appetite change and fatigue.  HENT:   Positive for hearing loss (water sensation in both ears).   Gastrointestinal: Negative for diarrhea, nausea and vomiting.  Musculoskeletal: Positive for gait problem.  Neurological: Positive for extremity weakness (weakness in L leg improving) and gait problem.  All other systems reviewed and are negative.   PAST MEDICAL/SURGICAL HISTORY:  Past Medical History:  Diagnosis Date  . Asthma   . Cancer (HCC) 04/21/2018   Lung Cancer   .  Carpal tunnel syndrome   . Cataract    left eye  . Diabetes mellitus without complication (HCC)   . DVT (deep venous thrombosis) (HCC)   . Hypertension   . Pneumonia 04/21/2018  . Pulmonary embolism (HCC) 03/2018  . Sepsis [facility])    Past Surgical History:  Procedure Laterality Date  . ABDOMINAL HYSTERECTOMY    . biopsy of right breast    . ESOPHAGOGASTRODUODENOSCOPY (EGD) WITH PROPOFOL N/A 09/12/2018   Procedure: ESOPHAGOGASTRODUODENOSCOPY (EGD) WITH PROPOFOL (scope in 1017, scope out 1032);  Surgeon: Lucretia Roers, MD;  Location: [facility];  Service: General;  Laterality: N/A;  . FOOT SURGERY    . implant left eye    . INTRAOCULAR LENS INSERTION    . IR GASTROSTOMY TUBE MOD SED  12/05/2018  . PEG PLACEMENT N/A 09/12/2018   Procedure: PERCUTANEOUS ENDOSCOPIC GASTROSTOMY (PEG) PLACEMENT;  Surgeon: Lucretia Roers, MD;  Location: [facility];  Service: General;  Laterality: N/A;    SOCIAL HISTORY:  Social History   Socioeconomic History  . Marital status: Widowed    Spouse name: Not on file  . Number of children: 4  . Years of education: 51  . Highest education level: High school graduate  Occupational History  . Not on file  Tobacco Use  . Smoking status: Never Smoker  . Smokeless tobacco: Never Used  Vaping Use  . Vaping Use: Never used  Substance and Sexual Activity  . Alcohol use: Not Currently    Comment:  occ  . Drug use: No  . Sexual activity: Yes    Partners: Male    Birth control/protection: Surgical  Other Topics Concern  . 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   Social Determinants of Health   Financial Resource Strain: Not on file  Food Insecurity: Not on file  Transportation Needs: Not on file  Physical Activity: Not on file  Stress: Not on  file  Social Connections: Not on file  Intimate Partner Violence: Not on file    FAMILY HISTORY:  Family History  Problem Relation Age of Onset  . Heart failure Mother   . Stroke Mother   . Heart attack Mother   . Kidney failure Other   . Leukemia Father     CURRENT MEDICATIONS:  Current Outpatient Medications  Medication Sig Dispense Refill  . albuterol (PROVENTIL HFA;VENTOLIN HFA) 108 (90 BASE) MCG/ACT inhaler Inhale 2 puffs into the lungs every 6 (six) hours as needed for wheezing.    Marland Kitchen amLODipine (NORVASC) 10 MG tablet Take 10 mg by mouth daily.    . Colchicine (MITIGARE) 0.6 MG CAPS Take 0.6 mg by mouth daily as needed. 15 capsule 3  . enoxaparin (LOVENOX) 150 MG/ML injection Inject 1 mL (150 mg total) into the skin daily. 0 mL   . hydrocortisone (ANUSOL-HC) 2.5 % rectal cream     . latanoprost (XALATAN) 0.005 % ophthalmic solution Place 1 drop into both eyes in the morning and at bedtime.     Marland Kitchen osimertinib mesylate (TAGRISSO) 40 MG tablet Take 2 tablets (80 mg total) by mouth daily. 60 tablet 3  . pantoprazole (PROTONIX) 40 MG tablet TAKE 1 TABLET BY MOUTH ONCE DAILY. 30 tablet 0  . potassium chloride (KLOR-CON) 10 MEQ tablet TAKE (2) TABLETS BY MOUTH TWICE DAILY. 120 tablet 3  . zolpidem (AMBIEN CR) 6.25 MG CR tablet Take 1 tablet (6.25 mg total) by mouth at bedtime as needed for sleep. 30 tablet 0   No current facility-administered medications for this visit.    ALLERGIES:  Allergies  Allergen Reactions  . Banana Anaphylaxis  . Aspirin Other (See Comments)    G.I. Upset     PHYSICAL EXAM:  Performance status (ECOG): 1 - Symptomatic but completely ambulatory  Vitals:   09/14/20 1116 09/14/20 1139  BP: 135/89 135/89  Pulse: (!) 110 (!) 110  Resp: 18 18  Temp: (!) 97.2 F (36.2 C) (!) 97.2 F (36.2 C)  SpO2: 96% 96%   Wt Readings from Last 3 Encounters:  09/14/20 197 lb 8 oz (89.6 kg)  08/17/20 186 lb 14.4 oz (84.8 kg)  07/12/20 188 lb 3.2 oz (85.4 kg)    Physical Exam Vitals reviewed.  Constitutional:      Appearance: Normal appearance.     Comments: In wheelchair  HENT:     Right Ear: Tympanic membrane, ear canal and external ear normal.     Left Ear: Tympanic membrane, ear canal and external ear normal.  Cardiovascular:     Rate and Rhythm: Normal rate and regular rhythm.     Pulses: Normal pulses.     Heart sounds: Normal heart sounds.  Pulmonary:     Effort: Pulmonary effort is normal.     Breath sounds:  Normal breath sounds.  Neurological:     General: No focal deficit present.     Mental Status: She is alert and oriented to person, place, and time.     Motor: Weakness (5/5 in BUE; 4/5 BLE) present.  Psychiatric:        Mood and Affect: Mood normal.        Behavior: Behavior normal.      LABORATORY DATA:  I have reviewed the labs as listed.  CBC Latest Ref Rng & Units 09/14/2020 08/17/2020 06/25/2020  WBC 4.0 - 10.5 K/uL 4.8 4.4 4.7  Hemoglobin 12.0 - 15.0 g/dL 14.5 15.1(H) 15.0  Hematocrit 36.0 - 46.0 % 45.8 47.0(H) 46.4(H)  Platelets 150 - 400 K/uL 184 183 223   CMP Latest Ref Rng & Units 09/14/2020 08/17/2020 06/25/2020  Glucose 70 - 99 mg/dL 81 105(H) 97  BUN 8 - 23 mg/dL $Remove'12 11 14  'fDypLza$ Creatinine 0.44 - 1.00 mg/dL 0.96 1.03(H) 1.12(H)  Sodium 135 - 145 mmol/L 138 138 137  Potassium 3.5 - 5.1 mmol/L 3.9 3.7 4.1  Chloride 98 - 111 mmol/L 105 104 104  CO2 22 - 32 mmol/L $RemoveB'23 22 26  'nubswgdC$ Calcium 8.9 - 10.3 mg/dL 9.7 9.9 10.0  Total Protein 6.5 - 8.1 g/dL 7.5 7.3 7.7  Total Bilirubin 0.3 - 1.2 mg/dL 0.7 0.4 0.4  Alkaline Phos 38 - 126 U/L 70 80 68  AST 15 - 41 U/L $Remo'17 17 21  'KOCZv$ ALT 0 - 44 U/L $Remo'19 16 21    'TenAJ$ DIAGNOSTIC IMAGING:  I have independently reviewed the scans and discussed with the patient. No results found.   ASSESSMENT:  1. Metastatic adenocarcinoma of the left lung with multiple brain mets: -Guardant 360 with EGFR exon 19 deletion. Patient never smoker. -Osimertinib 80 mg daily started on 07/28/2018, dose reduced  due to weight loss and decreased appetite in July 2020. -CT chest on 11/25/2019 shows stable disease with no evidence of recurrence or metastasis. -CT chest, abdomen and pelvis on 04/18/2020 with no findings of recurrent malignancy. Prior radiation therapy changes. Stable trace left pleural effusion. -PET scan from 07/07/2020 showed low-grade activity in the lungs consistent with radiation fibrosis and volume loss.  No evidence of recurrence or metastatic disease outside the brain.  2. Brain metastasis: -20 subcentimeter brain lesions and diagnosis. -Whole brain RT completed on 07/29/2018. -MRI of the brain with and without contrast on 09/12/2019 showed increased conspicuity of a 3 mm focus of enhancement at site of treated anterior left temporal lobe lesion. Increased conspicuity is related to improved imaging quality on the present exam. No new intracranial metastasis. -MRI of the brain with and without contrast on 12/21/2019 with no evidence of new or progressive disease. Minimal punctate enhancement remains visible at the site of previously treated left temporal lesion. 4 mm meningioma at the right frontoparietal vertex and 8 mm meningioma at the left anterior cranial fossa convexity are unchanged without mass-effect upon the brain. 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 New 2 mm enhancing lesion in the left. Occipital white matter suspicious for meta stasis. Minimally increased size of the treated left parietal metastasis. Additional punctate foci of cerebral enhancement bilaterally corresponding to previously treated mets. -30 Gray in 15 fractions completed on 07/29/2020.   PLAN:  1. Metastatic adenocarcinoma of the left lung with multiple brain mets: -PET scan on 07/07/2020 did not show any evidence of recurrence outside the  brain. - Osimertinib dose was  increased to 80 mg daily.  Dose for leptomeningeal metastasis is 160 mg. - Patient reports that she has tolerated 80 mg reasonably well.  However she ran out of pills 1 week ago. - We have recommended her to call pharmacy to get the medication shipped as soon as possible. - She is walking with help of walker.  She is accompanied by her daughter today.  Complains of fullness in ears. - I have examined her ears which looked normal with no tympanic effusion.  I think the fullness is coming from radiation therapy. - Reviewed her labs today which showed normal CBC.  LFTs were normal.  Electrolytes were also normal. - Continue osimertinib 80 mg daily at this time.  Will consider increasing dose further if she continues to tolerate well.  I plan to see her back in 4 weeks for follow-up.  2. Brain metastasis: -XRT 30 Gray in 15 fractions from 07/11/2020 through 07/29/2020.  No headaches reported.  3. Recurrent PE: -Continue Lovenox indefinitely.  No bleeding issues reported.  4. Chronic cough: -Cough has slightly improved.   Orders placed this encounter:  Orders Placed This Encounter  Procedures  . Comprehensive metabolic panel  . CBC with Differential/Platelet  . Magnesium     Derek Jack, MD [facility] [phone]   I, Milinda Antis, am acting as a scribe for Dr. Sanda Linger.  I, Derek Jack MD, have reviewed the above documentation for accuracy and completeness, and I agree with the above.

## 2020-09-14 NOTE — Patient Instructions (Signed)
[place] at [facility] Discharge Instructions  You were seen today by Dr. Delton Coombes. He went over your recent results. Continue taking Tagrisso daily; call the delivery service to have them deliver to your home. Dr. Delton Coombes will see you back in 1 month for labs and follow up.   Thank you for choosing [place] at [facility] to provide your oncology and hematology care.  To afford each patient quality time with our provider, please arrive at least 15 minutes before your scheduled appointment time.   If you have a lab appointment with the [facility] please come in thru the Main Entrance and check in at the main information desk  You need to re-schedule your appointment should you arrive 10 or more minutes late.  We strive to give you quality time with our providers, and arriving late affects you and other patients whose appointments are after yours.  Also, if you no show three or more times for appointments you may be dismissed from the clinic at the providers discretion.     Again, thank you for choosing [facility].  Our hope is that these requests will decrease the amount of time that you wait before being seen by our physicians.       _____________________________________________________________  Should you have questions after your visit to [facility], please contact our office at [phone] between the hours of 8:00 a.m. and 4:30 p.m.  Voicemails left after 4:00 p.m. will not be returned until the following business day.  For prescription refill requests, have your pharmacy contact our office and allow 72 hours.    Cancer Center Support Programs:   > Cancer Support Group  2nd Tuesday of the month 1pm-2pm, Journey Room

## 2020-09-22 DIAGNOSIS — M25562 Pain in left knee: Secondary | ICD-10-CM | POA: Diagnosis not present

## 2020-09-22 DIAGNOSIS — C7931 Secondary malignant neoplasm of brain: Secondary | ICD-10-CM | POA: Diagnosis not present

## 2020-09-24 DIAGNOSIS — I10 Essential (primary) hypertension: Secondary | ICD-10-CM | POA: Diagnosis not present

## 2020-09-24 DIAGNOSIS — J4540 Moderate persistent asthma, uncomplicated: Secondary | ICD-10-CM | POA: Diagnosis not present

## 2020-09-29 ENCOUNTER — Encounter: Payer: Self-pay | Admitting: Urology

## 2020-09-30 ENCOUNTER — Ambulatory Visit (HOSPITAL_COMMUNITY)
Admission: RE | Admit: 2020-09-30 | Discharge: 2020-09-30 | Disposition: A | Discharge status: Home / Self Care | Payer: Medicare Other | Source: Ambulatory Visit | Attending: Radiation Oncology | Admitting: Radiation Oncology

## 2020-09-30 ENCOUNTER — Other Ambulatory Visit: Payer: Self-pay

## 2020-09-30 ENCOUNTER — Other Ambulatory Visit: Payer: Self-pay | Admitting: Radiation Oncology

## 2020-09-30 DIAGNOSIS — C7931 Secondary malignant neoplasm of brain: Secondary | ICD-10-CM | POA: Diagnosis not present

## 2020-09-30 DIAGNOSIS — G936 Cerebral edema: Secondary | ICD-10-CM | POA: Diagnosis not present

## 2020-09-30 NOTE — Progress Notes (Signed)
Used Ultrasound to access both arms for IV access for MRI. Unable to find anything appropriate to attempt an IV. 2 IV Vascular RN's attempted 2 IV starts - unsuccessful. CT tech and patient's daughter aware.

## 2020-10-03 ENCOUNTER — Inpatient Hospital Stay: Payer: Medicare Other | Attending: Radiation Oncology

## 2020-10-04 ENCOUNTER — Ambulatory Visit
Admission: RE | Admit: 2020-10-04 | Discharge: 2020-10-04 | Disposition: A | Discharge status: Home / Self Care | Payer: Medicare Other | Source: Ambulatory Visit | Attending: Urology | Admitting: Urology

## 2020-10-04 DIAGNOSIS — C7931 Secondary malignant neoplasm of brain: Secondary | ICD-10-CM

## 2020-10-04 NOTE — Progress Notes (Signed)
Radiation Oncology         [phone]) [phone] ________________________________  Name: MERRILIE RIDPATH MRN: [id number]  Date: 10/04/2020  DOB: 06/01/1950  Post Treatment Note  CC: [name], MD  [name], MD  Diagnosis:   70 y.o. female with metastatic NSCLC of the left lower lobe lung with significant mediastinal/hilar lymph node involvement and brain metastases.  Interval Since Last Radiation:  2 years 07/16/2018 - 07/29/2018:   1. The left lung was treated to 30 Gy in 10 fractions of 3 Gy. 2. The whole brain was treated to 30 Gy in 10 fractions of 3 Gy.  Narrative:  I spoke with the patient to conduct her routine scheduled 3 month post-treatment follow up visit to review results of her recent follow up MRI brain scan via telephone to spare the patient unnecessary potential exposure in the healthcare setting during the current COVID-19 pandemic.    In summary, she initially presented to the [facility] ED with productive cough, shortness of breath, and right-sided chest pain on 04/24/2018 ongoing for 2 days prior. A CTA chest on admission revealed a large right pulmonary embolus and pleural effusion, as well as a left lower lobe mass measuring 3.8 x 3.7 x 2.1 cm, associated with hilar and mediastinal adenopathy highly suspicious for a pulmonary neoplasm. She met with Ames Dura, NP in pulmonology on 05/05/2018. The initial plan was to place the patient on oral anticoagulation for PE for at least 3 weeks and then discontinue for 48 hours prior to obtaining CT guided biopsy, however, the patient was not comfortable coming off anticoagulation 48 hours prior to procedure d/t associated risks, so the biopsy was postponed, with plans to have the pre-admitted for heparin bridging prior to biopsy.   PET/CT scan was performed on 05/14/2018, which confirmed ahypermetabolic left lower lobe lung mass measuring 3.7 cm, SUV of 11; hypermetabolic bilateral hilar adenopathy and subcarinal  adenopathy;thickened and irregular appearance of bilateral adrenal glands with increased SUV; right lower lobe airspace disease with hypermetabolic rim and a hypermetabolic nodule medial to the main airspace disease, whichmay represent evolving infection/inflammation.   She met with Dr. Ellin Saba on 05/19/2018 and he recommended a brain MRI to complete her disease staging.  This was performed on 05/30/2018, and revealed at least twenty subcentimeter enhancing brain lesions consistent withmetastases.   She was admitted to the hospital on 06/16/2018 for heparin bridging and underwent lower left lung lobe biopsy on 06/18/2018 with pathology revealing adenocarcinoma.  Immunohistochemistry was positive with TTF-1, Napsin A and cytokeratin 7 consistent with primary lung adenocarcinoma.  Unfortunately, there was not sufficient tissue for molecular testing.  She was scheduled for an outpatient consult visit in our office on 07/11/18 but unfortunately, she experienced worsening shortness of breath, cough and feeling poorly which prompted re-admission to [facility] on 07/06/2018 and treatment for postobstructive pneumonia and mild sepsis. A repeat chest CT on 07/10/18 revealed progression of pulmonary emboli bilaterally in the lungs as well as abnormal soft tissue in the supraclavicular regions, suspicious for progression of metastatic disease. A follow up head and neck ultrasound confirmed findings most compatible with metastatic lymphadenopathy. She had ultrasound-guided biopsy of right neck mass/supraclavicular lymphadenopathy for molecular testing on 07/11/18 to help guide systemic treatment options.  This was consistent with metastatic adenocarcinoma. Guardant 360 showed EGFR exon 19 deletion (EGFR [id number]).  We consulted with the patient during her hospitalization to discuss the options for palliative brain and chest radiation which she was in agreement  with. She completed a 2 week course of palliative  WBRT and chest radiation on 07/29/2018 which she tolerated well.  Her post-treatment brain MRI scans continued to demonstrate overall stable post treatment appearance of the brain without definite evidence of new or progressive metastatic disease and an unchanged appearance of the subcentimeter meningiomas overlying the bilateral frontal lobes.  She was started on targeted systemic therapy with Osimertinib 80 mg daily.  She was admitted to the hospital with nausea and vomiting from 09/11/2018 through 09/13/2018 and had PEG tube placed on 09/12/2018.  Follow-up imaging for disease restaging showed an excellent response to systemic osimertinib was reduced to 40 mg daily around 12/08/2018.   therapy with resolution of the adenopathy and no evidence of progressive disease.  Due to persistent nausea and decreased appetite, her dose of Her follow-up restaging imaging with CT C/A/P and brain MRI scans remained stable without evidence of disease recurrence or progression.  She was able to have her G-tube removed in January 2021 and has continued with a good appetite and maintained her weight since that time.  Her MRI brain scan from 03/24/2020 was reviewed in the multidisciplinary brain conference with consensus recommendation to continue with routine 3 month imaging with close attention to a 2 mm focus of enhancement along the posterior paramidline left parietal cortex.  Her most recent restaging CT C/A/P from 04/18/2020 continues to show stable disease without evidence of recurrent malignancy to the current plan is for her to continue on the current dose of osimertinib.  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 She went on to complete repeat whole brain radiation in 15 fractions on 07/29/20, tolerated well. On  follow up MRI brain from 09/30/20, they were not able to get IV access so no contrast was given. With this limitation, there was a small area of new vasogenic edema in the right parietal lobe corresponding to one site of leptomeningeal enhancement in January, suggesting some possible early progression but no definite intracranial mass effect or other interval finding.                 On review of systems, the patient states that she is doing fairly well overall.  She has continued with a good appetite on the increased dose of Osimertinib (Tagrisso) at 80mg  daily and she continues to tolerate systemic treatment well.  She denies abdominal pain, N/V/D and denies any dysphagia or throat irritation.  She denies chest pain, productive cough, hemoptysis or increased shortness of breath. She continues to gradually regain her energy and strength and denies focal weakness, tremor, changes in visual or auditory acuity, tinnitus or seizure activity.  She has had some increased frequency of mild frontal headaches and a new onset of intermittent left upper extremity weakness over the past 2 weeks.  This prompted evaluation through the [facility] emergency department on 06/25/2020 and she was started on Decadron 4 mg p.o. twice daily which she completed as prescribed.  She has continued taking Meclazine as needed for dizziness/imbalance associated with vertigo with similar symptoms as what she has had in the past with prior episodes of vertigo managed by her PCP, Dr. Ouida Sills. She is currently without complaints of dizziness/imbalance. She has continued on targeted systemic therapy with Osimertinib at an increased dose to 80 mg under the care and direction of Dr. Ellin Saba.  Her  most recent systemic imaging from restaging PET scan on 07/07/2020 showed stable postradiation changes in the medial aspects of both hemithoraces and left lower lobe without evidence of recurrent or new metastatic disease in the chest. The current plan is to  continue 80mg  Tagrisso dailya nd consider increasing the dose further if she continues to tolerate this dose well, with a goal of getting up to 180mg  daily which is the recommended treatment dose for leptomeningeal metastasis.  ALLERGIES:  is allergic to banana and aspirin.  Meds: Current Outpatient Medications  Medication Sig Dispense Refill  . albuterol (PROVENTIL HFA;VENTOLIN HFA) 108 (90 BASE) MCG/ACT inhaler Inhale 2 puffs into the lungs every 6 (six) hours as needed for wheezing.    Marland Kitchen amLODipine (NORVASC) 10 MG tablet Take 10 mg by mouth daily.    . Colchicine (MITIGARE) 0.6 MG CAPS Take 0.6 mg by mouth daily as needed. 15 capsule 3  . enoxaparin (LOVENOX) 150 MG/ML injection Inject 1 mL (150 mg total) into the skin daily. 0 mL   . latanoprost (XALATAN) 0.005 % ophthalmic solution Place 1 drop into both eyes in the morning and at bedtime.     Marland Kitchen osimertinib mesylate (TAGRISSO) 40 MG tablet Take 2 tablets (80 mg total) by mouth daily. 60 tablet 3  . pantoprazole (PROTONIX) 40 MG tablet TAKE 1 TABLET BY MOUTH ONCE DAILY. 30 tablet 0  . potassium chloride (KLOR-CON) 10 MEQ tablet TAKE (2) TABLETS BY MOUTH TWICE DAILY. (Patient taking differently: Patient states that she is taking ) 120 tablet 3  . zolpidem (AMBIEN CR) 6.25 MG CR tablet Take 1 tablet (6.25 mg total) by mouth at bedtime as needed for sleep. 30 tablet 0  . hydrocortisone (ANUSOL-HC) 2.5 % rectal cream  (Patient not taking: Reported on 09/29/2020)     No current facility-administered medications for this encounter.    Physical Findings: Unable to assess due to to telephone format used for this visit.  Lab Findings: Lab Results  Component Value Date   WBC 4.8 09/14/2020   HGB 14.5 09/14/2020   HCT 45.8 09/14/2020   MCV 87.9 09/14/2020   PLT 184 09/14/2020     Radiographic Findings: MR BRAIN WO CONTRAST  Result Date: 10/02/2020 CLINICAL DATA:  70 year old female with a history of metastatic non-small cell lung  cancer. Brain metastases treated with whole brain radiation. Suspicion of new right hemisphere leptomeningeal disease in January. Unable to obtain IV access today. EXAM: MRI HEAD WITHOUT CONTRAST TECHNIQUE: Multiplanar, multiecho pulse sequences of the brain and surrounding structures were obtained without intravenous contrast. COMPARISON:  Brain MRI 06/24/2020 and earlier. FINDINGS: Noncontrast imaging today consisting of DWI, FLAIR, SWI and noncontrast T1 weighted imaging. Right parietal lobe small focus of T2 shine through on diffusion is stable since January (series 3, image 41 today), although there is new white matter FLAIR hyperintensity there on series 4, image 48. Two small foci of abnormal right centrum semi of bowel white matter diffusion on series 3, image 37 are new since January, and the more anterior of these appears restricted on ADC, with T2 shine through at the posterior lesion. No other abnormal diffusion. Patchy and vague bilateral cerebral white matter FLAIR hyperintensity shows mild generalized progression since January, along with several new areas of discrete cystic white matter encephalomalacia now (series 4, image 40 posterior to the right lateral ventricle) and these may all be the sequelae of XRT. Focal new right parietal lobe subcortical white matter FLAIR on series 4, image 48 (versus  series 4, image 40 in January) as stated above - and correlating with prior postcontrast imaging this is 1 site of leptomeningeal enhancement seen in January. But no other focal or suspicious increased FLAIR signal. SWI appears stable, with occasional punctate foci of hemosiderin which are probably post treatment related. Axial T1 weighted imaging suggests a nonacute lacunar infarct in the right paracentral pons on series 600, image 154, new since January. No midline shift, mass effect, ventriculomegaly, extra-axial collection or acute intracranial hemorrhage. Cervicomedullary junction and pituitary are  within normal limits. Visualized bone marrow signal is within normal limits. Stable visible cervical spine degeneration. Stable paranasal sinuses and mastoids. IMPRESSION: 1. No IV access could be obtained, thus this restaging exam is non-contrast and limited. 2. Small area of new vasogenic edema in the right parietal lobe corresponds to one site of leptomeningeal enhancement in January, suggesting some progression. 3. But other bilateral increased white matter FLAIR signal, as well as two small foci of abnormal diffusion in the right centrum semiovale more resembles sequelae of small vessel disease/XRT. 4. No intracranial mass effect or other interval finding. Electronically Signed   By: Odessa Fleming M.D.   On: 10/02/2020 05:32    Impression/Plan:  This visit was conducted via telephone to spare the patient unnecessary potential exposure in the healthcare setting during the current COVID-19 pandemic.  1. 70 y.o. 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 We reviewed her recent post treatment MRI brain from 09/30/20, which showed a small area of new vasogenic edema in the right parietal lobe corresponding to one site of leptomeningeal enhancement in January, suggesting some possible early progression but no definite intracranial mass effect or other interval finding. The recommendation is to continue to monitor with a repeat MRI brain scan in 3 months and pending this scan is clear, we will continue routine monitoring with serial MRI brain scans every 3 months. I will plan to connect with the patient by phone after each scan to review results and recommendations from the multidisciplinary brain conference.  She knows that she is welcome to call at anytime with any questions or concerns in the interim. She will also continue in routine follow-up under the care and direction  of Dr. Ellin Saba for continued management of her systemic disease.  The current plan is to continue her systemic therapy with Tagrisso 80mg  and will attempt to increase the dose slowly since she has not tolerated higher doses well in the past.              2. Dizziness/vertigo. Managed with Meclazine, prescribed by her PCP, Dr. Ouida Sills and symptoms are currently resolved. We will continue to follow expectantly.  Given current concerns for patient exposure during the COVID-19 pandemic, this encounter was conducted via telephone. The patient was notified in advance and was offered a WebEX meeting to allow for face to face communication but unfortunately reported that she did not have the appropriate resources/technology to support such a visit and instead preferred to proceed with telephone consult. The patient has given verbal consent for this type of encounter. The time spent during this encounter was 30 minutes and 50% of that time was spent in the coordination of her care. The attendants for this meeting include Dorrien Grunder PA-C, and patient, Brentney Alhassan. Dobbs. During the encounter, Nell Schrack PA-C was located at [facility] Radiation Oncology Department.  Patient,  Elnita Maxwell. Cancro was located at home.    Marguarite Arbour, PA-C

## 2020-10-06 ENCOUNTER — Emergency Department (HOSPITAL_COMMUNITY): Payer: Medicare Other

## 2020-10-06 ENCOUNTER — Other Ambulatory Visit: Payer: Self-pay

## 2020-10-06 ENCOUNTER — Emergency Department (HOSPITAL_COMMUNITY)
Admission: EM | Admit: 2020-10-06 | Discharge: 2020-10-07 | Disposition: A | Discharge status: Home / Self Care | Payer: Medicare Other | Attending: Emergency Medicine | Admitting: Emergency Medicine

## 2020-10-06 ENCOUNTER — Encounter (HOSPITAL_COMMUNITY): Payer: Self-pay | Admitting: *Deleted

## 2020-10-06 DIAGNOSIS — Z86711 Personal history of pulmonary embolism: Secondary | ICD-10-CM | POA: Insufficient documentation

## 2020-10-06 DIAGNOSIS — I10 Essential (primary) hypertension: Secondary | ICD-10-CM | POA: Diagnosis not present

## 2020-10-06 DIAGNOSIS — Z20822 Contact with and (suspected) exposure to covid-19: Secondary | ICD-10-CM | POA: Diagnosis not present

## 2020-10-06 DIAGNOSIS — R0602 Shortness of breath: Secondary | ICD-10-CM | POA: Diagnosis not present

## 2020-10-06 DIAGNOSIS — E119 Type 2 diabetes mellitus without complications: Secondary | ICD-10-CM | POA: Insufficient documentation

## 2020-10-06 DIAGNOSIS — R059 Cough, unspecified: Secondary | ICD-10-CM | POA: Diagnosis not present

## 2020-10-06 DIAGNOSIS — Z85118 Personal history of other malignant neoplasm of bronchus and lung: Secondary | ICD-10-CM | POA: Diagnosis not present

## 2020-10-06 DIAGNOSIS — J45901 Unspecified asthma with (acute) exacerbation: Secondary | ICD-10-CM

## 2020-10-06 DIAGNOSIS — Z79899 Other long term (current) drug therapy: Secondary | ICD-10-CM | POA: Diagnosis not present

## 2020-10-06 DIAGNOSIS — J4541 Moderate persistent asthma with (acute) exacerbation: Secondary | ICD-10-CM | POA: Insufficient documentation

## 2020-10-06 DIAGNOSIS — Z7901 Long term (current) use of anticoagulants: Secondary | ICD-10-CM | POA: Insufficient documentation

## 2020-10-06 DIAGNOSIS — J479 Bronchiectasis, uncomplicated: Secondary | ICD-10-CM | POA: Diagnosis not present

## 2020-10-06 DIAGNOSIS — R000 Tachycardia, unspecified: Secondary | ICD-10-CM | POA: Diagnosis not present

## 2020-10-06 LAB — BASIC METABOLIC PANEL
Anion gap: 10 (ref 5–15)
BUN: 9 mg/dL (ref 8–23)
CO2: 24 mmol/L (ref 22–32)
Calcium: 10 mg/dL (ref 8.9–10.3)
Chloride: 100 mmol/L (ref 98–111)
Creatinine, Ser: 1.09 mg/dL — ABNORMAL HIGH (ref 0.44–1.00)
GFR, Estimated: 55 mL/min — ABNORMAL LOW (ref >60)
Glucose, Bld: 94 mg/dL (ref 70–99)
Potassium: 3.9 mmol/L (ref 3.5–5.1)
Sodium: 134 mmol/L — ABNORMAL LOW (ref 135–145)

## 2020-10-06 LAB — CBC WITH DIFFERENTIAL/PLATELET
Abs Immature Granulocytes: 0.02 10*3/uL (ref 0.00–0.07)
Basophils Absolute: 0.1 10*3/uL (ref 0.0–0.1)
Basophils Relative: 1 %
Eosinophils Absolute: 0.7 10*3/uL — ABNORMAL HIGH (ref 0.0–0.5)
Eosinophils Relative: 14 %
HCT: 47.2 % — ABNORMAL HIGH (ref 36.0–46.0)
Hemoglobin: 15.4 g/dL — ABNORMAL HIGH (ref 12.0–15.0)
Immature Granulocytes: 0 %
Lymphocytes Relative: 19 %
Lymphs Abs: 0.9 10*3/uL (ref 0.7–4.0)
MCH: 28.6 pg (ref 26.0–34.0)
MCHC: 32.6 g/dL (ref 30.0–36.0)
MCV: 87.6 fL (ref 80.0–100.0)
Monocytes Absolute: 0.6 10*3/uL (ref 0.1–1.0)
Monocytes Relative: 13 %
Neutro Abs: 2.5 10*3/uL (ref 1.7–7.7)
Neutrophils Relative %: 53 %
Platelets: 219 10*3/uL (ref 150–400)
RBC: 5.39 MIL/uL — ABNORMAL HIGH (ref 3.87–5.11)
RDW: 14.7 % (ref 11.5–15.5)
WBC: 4.7 10*3/uL (ref 4.0–10.5)
nRBC: 0 % (ref 0.0–0.2)

## 2020-10-06 LAB — RESP PANEL BY RT-PCR (FLU A&B, COVID) ARPGX2
Influenza A by PCR: NEGATIVE
Influenza B by PCR: NEGATIVE
SARS Coronavirus 2 by RT PCR: NEGATIVE

## 2020-10-06 MED ORDER — METHYLPREDNISOLONE SODIUM SUCC 125 MG IJ SOLR
125.0000 mg | Freq: Once | INTRAMUSCULAR | Status: AC
  Administered 2020-10-06: 125 mg via INTRAVENOUS
  Filled 2020-10-06: qty 2

## 2020-10-06 MED ORDER — ALBUTEROL SULFATE HFA 108 (90 BASE) MCG/ACT IN AERS
4.0000 | INHALATION_SPRAY | Freq: Once | RESPIRATORY_TRACT | Status: AC
  Administered 2020-10-06: 4 via RESPIRATORY_TRACT
  Filled 2020-10-06: qty 6.7

## 2020-10-06 MED ORDER — IPRATROPIUM-ALBUTEROL 0.5-2.5 (3) MG/3ML IN SOLN
3.0000 mL | Freq: Once | RESPIRATORY_TRACT | Status: AC
  Administered 2020-10-06: 3 mL via RESPIRATORY_TRACT
  Filled 2020-10-06: qty 3

## 2020-10-06 NOTE — ED Provider Notes (Incomplete)
[facility] EMERGENCY DEPARTMENT Provider Note   CSN: [id number] Arrival date & time: 10/06/20  1955     History Chief Complaint  Patient presents with  . Shortness of Breath    X 2 weeks at night     Kaitlin [name] is a 70 y.o. female with a history of asthma, left lung cancer with a history of metastasis to the brain under the care of Dr. Delton Coombes, also has diabetes, hypertension, history of pulmonary embolism and pneumonia presenting with increased wheezing and shortness of breath which has been present for the past 2 weeks but worse today.  She also describes a cough which is sometimes productive of a clear sputum.  She states she typically has asthma flares with weather changes and states this is a typical pattern for her asthma.  She denies chest pain, denies pleuritic symptoms, has no orthopnea or peripheral edema.  Denies lower extremity pain.  Her symptoms were gradual in onset.  She has used her albuterol inhaler with only transient improvement in her symptoms. HPI     Past Medical History:  Diagnosis Date  . Asthma   . Cancer ([place]) 04/21/2018   Lung Cancer   . Carpal tunnel syndrome   . Cataract    left eye  . Diabetes mellitus without complication ([place])   . DVT (deep venous thrombosis) ([place])   . Hypertension   . Pneumonia 04/21/2018  . Pulmonary embolism ([place]) 03/2018  . Sepsis [facility])     Patient Active Problem List   Diagnosis Date Noted  . Prolapsed internal hemorrhoids, grade 3 04/18/2019  . Anal polyp 04/18/2019  . Malnutrition of moderate degree 12/03/2018  . PEG tube malfunction ([place]) 12/02/2018  . History of pulmonary embolism 12/02/2018  . Protein-calorie malnutrition, severe 09/12/2018  . Metastatic lung cancer (metastasis from lung to other site), left ([place])   . Nausea and vomiting 09/11/2018  . Weight loss 09/11/2018  . Hypokalemia 09/09/2018  . Brain metastases ([place]) 07/12/2018  . Malignant pericardial effusion ([place]) 07/08/2018  . HCAP  (healthcare-associated pneumonia) 07/06/2018  . Adenocarcinoma of lung, right ([place]) 07/06/2018  . Adenocarcinoma, lung, left ([place]) 07/03/2018  . Asthma 06/16/2018  . Hypertension 06/16/2018  . Diabetes mellitus without complication ([place]) 72/01/4708  . DVT (deep venous thrombosis) ([place]) 06/16/2018  . Pulmonary embolism during treatment with long-term anticoagulation therapy ([place]) 06/16/2018  . Lung mass   . Pulmonary emboli ([place]) 04/24/2018  . Routine screening for STI (sexually transmitted infection) 03/14/2016    Past Surgical History:  Procedure Laterality Date  . ABDOMINAL HYSTERECTOMY    . biopsy of right breast    . ESOPHAGOGASTRODUODENOSCOPY (EGD) WITH PROPOFOL N/A 09/12/2018   Procedure: ESOPHAGOGASTRODUODENOSCOPY (EGD) WITH PROPOFOL (scope in 1017, scope out 1032);  Surgeon: Virl Cagey, MD;  Location: [facility];  Service: General;  Laterality: N/A;  . FOOT SURGERY    . implant left eye    . INTRAOCULAR LENS INSERTION    . IR GASTROSTOMY TUBE MOD SED  12/05/2018  . PEG PLACEMENT N/A 09/12/2018   Procedure: PERCUTANEOUS ENDOSCOPIC GASTROSTOMY (PEG) PLACEMENT;  Surgeon: Virl Cagey, MD;  Location: [facility];  Service: General;  Laterality: N/A;     OB History    Gravida  4   Para  4   Term  4   Preterm      AB      Living        SAB      IAB  Ectopic      Multiple      Live Births              Family History  Problem Relation Age of Onset  . Heart failure Mother   . Stroke Mother   . Heart attack Mother   . Kidney failure Other   . Leukemia Father     Social History   Tobacco Use  . Smoking status: Never Smoker  . Smokeless tobacco: Never Used  Vaping Use  . Vaping Use: Never used  Substance Use Topics  . Alcohol use: Not Currently    Comment: occ  . Drug use: No    Home Medications Prior to Admission medications   Medication Sig Start Date End Date Taking? Authorizing Provider  albuterol (PROVENTIL HFA;VENTOLIN HFA) 108  (90 BASE) MCG/ACT inhaler Inhale 2 puffs into the lungs every 6 (six) hours as needed for wheezing.    [provider]  amLODipine (NORVASC) 10 MG tablet Take 10 mg by mouth daily. 05/13/20   [provider]  Colchicine (MITIGARE) 0.6 MG CAPS Take 0.6 mg by mouth daily as needed. 12/04/19   Derek Jack, MD  enoxaparin (LOVENOX) 150 MG/ML injection Inject 1 mL (150 mg total) into the skin daily. 12/06/18   Johnson, Clanford L, MD  hydrocortisone (ANUSOL-HC) 2.5 % rectal cream  11/19/18   [provider]  latanoprost (XALATAN) 0.005 % ophthalmic solution Place 1 drop into both eyes in the morning and at bedtime.  01/14/20   [provider]  osimertinib mesylate (TAGRISSO) 40 MG tablet Take 2 tablets (80 mg total) by mouth daily. 09/06/20   Derek Jack, MD  pantoprazole (PROTONIX) 40 MG tablet TAKE 1 TABLET BY MOUTH ONCE DAILY. 03/25/19   Lockamy, Randi L, NP-C  potassium chloride (KLOR-CON) 10 MEQ tablet TAKE (2) TABLETS BY MOUTH TWICE DAILY. Patient taking differently: Patient states that she is taking 170meq 04/14/20   Derek Jack, MD  zolpidem (AMBIEN CR) 6.25 MG CR tablet Take 1 tablet (6.25 mg total) by mouth at bedtime as needed for sleep. 10/16/18   Derek Jack, MD    Allergies    Banana and Aspirin  Review of Systems   Review of Systems  Constitutional: Negative for chills and fever.  HENT: Negative for congestion and sore throat.   Eyes: Negative.   Respiratory: Positive for cough, chest tightness and wheezing. Negative for shortness of breath.   Cardiovascular: Negative for chest pain, palpitations and leg swelling.  Gastrointestinal: Negative for abdominal pain and nausea.  Genitourinary: Negative.   Musculoskeletal: Negative for arthralgias, joint swelling and neck pain.  Skin: Negative.  Negative for rash and wound.  Neurological: Negative for light-headedness and headaches.  Psychiatric/Behavioral: Negative.    All other systems reviewed and are negative.   Physical Exam Updated Vital Signs BP (!) 137/92   Pulse 93   Temp 98.4 F (36.9 C) (Oral)   Resp 19   Ht 5\' 7"  (1.702 m)   Wt 81.6 kg   SpO2 97%   BMI 28.19 kg/m   Physical Exam Vitals and nursing note reviewed.  Constitutional:      Appearance: She is well-developed.  HENT:     Head: Normocephalic and atraumatic.  Eyes:     Conjunctiva/sclera: Conjunctivae normal.  Cardiovascular:     Rate and Rhythm: Normal rate and regular rhythm.     Heart sounds: Normal heart sounds.  Pulmonary:     Effort: Pulmonary effort is normal.  Breath sounds: Wheezing present. No rhonchi or rales.     Comments: Expiratory wheeze throughout all lung fields with prolonged expirations.  No accessory muscle use. Abdominal:     General: Bowel sounds are normal.     Palpations: Abdomen is soft.     Tenderness: There is no abdominal tenderness.  Musculoskeletal:        General: Normal range of motion.     Cervical back: Normal range of motion.     Right lower leg: No tenderness. No edema.     Left lower leg: No tenderness. No edema.  Skin:    General: Skin is warm and dry.  Neurological:     General: No focal deficit present.     Mental Status: She is alert.     ED Results / Procedures / Treatments   Labs (all labs ordered are listed, but only abnormal results are displayed) Labs Reviewed  CBC WITH DIFFERENTIAL/PLATELET - Abnormal; Notable for the following components:      Result Value   RBC 5.39 (*)    Hemoglobin 15.4 (*)    HCT 47.2 (*)    Eosinophils Absolute 0.7 (*)    All other components within normal limits  BASIC METABOLIC PANEL - Abnormal; Notable for the following components:   Sodium 134 (*)    Creatinine, Ser 1.09 (*)    GFR, Estimated 55 (*)    All other components within normal limits  RESP PANEL BY RT-PCR (FLU A&B, COVID) ARPGX2    EKG EKG Interpretation  Date/Time:  Thursday Oct 06 2020 20:08:47  EDT Ventricular Rate:  109 PR Interval:  194 QRS Duration: 79 QT Interval:  327 QTC Calculation: 441 R Axis:   22 Text Interpretation: Sinus tachycardia Low voltage, precordial leads Confirmed by Noemi Chapel [phone]) on 10/06/2020 10:13:43 PM   Radiology DG Chest 2 View  Result Date: 10/06/2020 CLINICAL DATA:  Shortness of breath over the last 2 weeks with cough EXAM: CHEST - 2 VIEW COMPARISON:  CT 04/18/2020 FINDINGS: Heart size is normal. Mediastinal shadows are normal. Bilateral chronic pulmonary scarring and bronchiectasis as seen on the previous CT. No identifiable new or acute infiltrate. No heart failure or effusion. IMPRESSION: Chronic pulmonary scarring as shown by the previous CT study. I do not identify any new or acute pulmonary pathology. Electronically Signed   By: Nelson Chimes M.D.   On: 10/06/2020 20:45    Procedures Procedures {Remember to document critical care time when appropriate:1}  Medications Ordered in ED Medications  ipratropium-albuterol (DUONEB) 0.5-2.5 (3) MG/3ML nebulizer solution 3 mL (has no administration in time range)  albuterol (VENTOLIN HFA) 108 (90 Base) MCG/ACT inhaler 4 puff (4 puffs Inhalation Given 10/06/20 2058)  methylPREDNISolone sodium succinate (SOLU-MEDROL) 125 mg/2 mL injection 125 mg (125 mg Intravenous Given 10/06/20 2137)    ED Course  I have reviewed the triage vital signs and the nursing notes.  Pertinent labs & imaging results that were available during my care of the patient were reviewed by me and considered in my medical decision making (see chart for details).    MDM Rules/Calculators/A&P                          Patient with a 2-week history of increasing wheezing and shortness of breath.  Although she has a history of PE she denies chest pain, pleuritic symptoms.  Symptoms have been gradual in onset, not suggestive of PE presentation.  She was  given IV Solu-Medrol and an albuterol MDI while awaiting COVID results.  She had  moderate improvement with increased aeration on repeat lung exam after the MDI treatment.  She still sounded fairly tight at her bilateral bases with occasional scattered wheezing throughout upper lung fields.  A full neb treatment including Atrovent and albuterol were then ordered. Final Clinical Impression(s) / ED Diagnoses Final diagnoses:  None    Rx / DC Orders ED Discharge Orders    None

## 2020-10-06 NOTE — ED Notes (Signed)
Pt is a hard stick, RN trying to establish IV access.

## 2020-10-06 NOTE — ED Triage Notes (Signed)
Pt c/o sob x 2 weeks with a cough

## 2020-10-06 NOTE — ED Provider Notes (Signed)
[facility] EMERGENCY DEPARTMENT Provider Note   CSN: [id number] Arrival date & time: 10/06/20  1955     History Chief Complaint  Patient presents with  . Shortness of Breath    X 2 weeks at night     Kaitlin [name] is a 70 y.o. female with a history of asthma, left lung cancer with a history of metastasis to the brain under the care of Dr. Delton Coombes, also has diabetes, hypertension, history of pulmonary embolism and pneumonia presenting with increased wheezing and shortness of breath which has been present for the past 2 weeks but worse today.  She also describes a cough which is sometimes productive of a clear sputum.  She states she typically has asthma flares with weather changes and states this is a typical pattern for her asthma.  She denies chest pain, denies pleuritic symptoms, has no orthopnea or peripheral edema.  Denies lower extremity pain.  Her symptoms were gradual in onset.  She has used her albuterol inhaler with only transient improvement in her symptoms. HPI     Past Medical History:  Diagnosis Date  . Asthma   . Cancer ([place]) 04/21/2018   Lung Cancer   . Carpal tunnel syndrome   . Cataract    left eye  . Diabetes mellitus without complication ([place])   . DVT (deep venous thrombosis) ([place])   . Hypertension   . Pneumonia 04/21/2018  . Pulmonary embolism ([place]) 03/2018  . Sepsis [facility])     Patient Active Problem List   Diagnosis Date Noted  . Prolapsed internal hemorrhoids, grade 3 04/18/2019  . Anal polyp 04/18/2019  . Malnutrition of moderate degree 12/03/2018  . PEG tube malfunction ([place]) 12/02/2018  . History of pulmonary embolism 12/02/2018  . Protein-calorie malnutrition, severe 09/12/2018  . Metastatic lung cancer (metastasis from lung to other site), left ([place])   . Nausea and vomiting 09/11/2018  . Weight loss 09/11/2018  . Hypokalemia 09/09/2018  . Brain metastases ([place]) 07/12/2018  . Malignant pericardial effusion ([place]) 07/08/2018  . HCAP  (healthcare-associated pneumonia) 07/06/2018  . Adenocarcinoma of lung, right ([place]) 07/06/2018  . Adenocarcinoma, lung, left ([place]) 07/03/2018  . Asthma 06/16/2018  . Hypertension 06/16/2018  . Diabetes mellitus without complication ([place]) 67/04/4579  . DVT (deep venous thrombosis) ([place]) 06/16/2018  . Pulmonary embolism during treatment with long-term anticoagulation therapy ([place]) 06/16/2018  . Lung mass   . Pulmonary emboli ([place]) 04/24/2018  . Routine screening for STI (sexually transmitted infection) 03/14/2016    Past Surgical History:  Procedure Laterality Date  . ABDOMINAL HYSTERECTOMY    . biopsy of right breast    . ESOPHAGOGASTRODUODENOSCOPY (EGD) WITH PROPOFOL N/A 09/12/2018   Procedure: ESOPHAGOGASTRODUODENOSCOPY (EGD) WITH PROPOFOL (scope in 1017, scope out 1032);  Surgeon: Virl Cagey, MD;  Location: [facility];  Service: General;  Laterality: N/A;  . FOOT SURGERY    . implant left eye    . INTRAOCULAR LENS INSERTION    . IR GASTROSTOMY TUBE MOD SED  12/05/2018  . PEG PLACEMENT N/A 09/12/2018   Procedure: PERCUTANEOUS ENDOSCOPIC GASTROSTOMY (PEG) PLACEMENT;  Surgeon: Virl Cagey, MD;  Location: [facility];  Service: General;  Laterality: N/A;     OB History    Gravida  4   Para  4   Term  4   Preterm      AB      Living        SAB      IAB  Ectopic      Multiple      Live Births              Family History  Problem Relation Age of Onset  . Heart failure Mother   . Stroke Mother   . Heart attack Mother   . Kidney failure Other   . Leukemia Father     Social History   Tobacco Use  . Smoking status: Never Smoker  . Smokeless tobacco: Never Used  Vaping Use  . Vaping Use: Never used  Substance Use Topics  . Alcohol use: Not Currently    Comment: occ  . Drug use: No    Home Medications Prior to Admission medications   Medication Sig Start Date End Date Taking? Authorizing Provider  predniSONE (DELTASONE) 50 MG tablet Take  one tablet daily for 5 days 10/07/20  Yes Jerney Baksh, Almyra Free, PA-C  albuterol (PROVENTIL HFA;VENTOLIN HFA) 108 (90 BASE) MCG/ACT inhaler Inhale 2 puffs into the lungs every 6 (six) hours as needed for wheezing.    [provider]  amLODipine (NORVASC) 10 MG tablet Take 10 mg by mouth daily. 05/13/20   [provider]  Colchicine (MITIGARE) 0.6 MG CAPS Take 0.6 mg by mouth daily as needed. 12/04/19   Derek Jack, MD  enoxaparin (LOVENOX) 150 MG/ML injection Inject 1 mL (150 mg total) into the skin daily. 12/06/18   Johnson, Clanford L, MD  hydrocortisone (ANUSOL-HC) 2.5 % rectal cream  11/19/18   [provider]  latanoprost (XALATAN) 0.005 % ophthalmic solution Place 1 drop into both eyes in the morning and at bedtime.  01/14/20   [provider]  osimertinib mesylate (TAGRISSO) 40 MG tablet Take 2 tablets (80 mg total) by mouth daily. 09/06/20   Derek Jack, MD  pantoprazole (PROTONIX) 40 MG tablet TAKE 1 TABLET BY MOUTH ONCE DAILY. 03/25/19   Lockamy, Randi L, NP-C  potassium chloride (KLOR-CON) 10 MEQ tablet TAKE (2) TABLETS BY MOUTH TWICE DAILY. Patient taking differently: Patient states that she is taking 140meq 04/14/20   Derek Jack, MD  zolpidem (AMBIEN CR) 6.25 MG CR tablet Take 1 tablet (6.25 mg total) by mouth at bedtime as needed for sleep. 10/16/18   Derek Jack, MD    Allergies    Banana and Aspirin  Review of Systems   Review of Systems  Constitutional: Negative for chills and fever.  HENT: Negative for congestion and sore throat.   Eyes: Negative.   Respiratory: Positive for cough, chest tightness and wheezing. Negative for shortness of breath.   Cardiovascular: Negative for chest pain, palpitations and leg swelling.  Gastrointestinal: Negative for abdominal pain and nausea.  Genitourinary: Negative.   Musculoskeletal: Negative for arthralgias, joint swelling and neck pain.  Skin: Negative.  Negative for rash and  wound.  Neurological: Negative for light-headedness and headaches.  Psychiatric/Behavioral: Negative.   All other systems reviewed and are negative.   Physical Exam Updated Vital Signs BP 139/87   Pulse 92   Temp 98.4 F (36.9 C) (Oral)   Resp 19   Ht 5\' 7"  (1.702 m)   Wt 81.6 kg   SpO2 98%   BMI 28.19 kg/m   Physical Exam Vitals and nursing note reviewed.  Constitutional:      Appearance: She is well-developed.  HENT:     Head: Normocephalic and atraumatic.  Eyes:     Conjunctiva/sclera: Conjunctivae normal.  Cardiovascular:     Rate and Rhythm: Normal rate and regular rhythm.  Heart sounds: Normal heart sounds.  Pulmonary:     Effort: Pulmonary effort is normal.     Breath sounds: Wheezing present. No rhonchi or rales.     Comments: Expiratory wheeze throughout all lung fields with prolonged expirations.  No accessory muscle use. Abdominal:     General: Bowel sounds are normal.     Palpations: Abdomen is soft.     Tenderness: There is no abdominal tenderness.  Musculoskeletal:        General: Normal range of motion.     Cervical back: Normal range of motion.     Right lower leg: No tenderness. No edema.     Left lower leg: No tenderness. No edema.  Skin:    General: Skin is warm and dry.  Neurological:     General: No focal deficit present.     Mental Status: She is alert.     ED Results / Procedures / Treatments   Labs (all labs ordered are listed, but only abnormal results are displayed) Labs Reviewed  CBC WITH DIFFERENTIAL/PLATELET - Abnormal; Notable for the following components:      Result Value   RBC 5.39 (*)    Hemoglobin 15.4 (*)    HCT 47.2 (*)    Eosinophils Absolute 0.7 (*)    All other components within normal limits  BASIC METABOLIC PANEL - Abnormal; Notable for the following components:   Sodium 134 (*)    Creatinine, Ser 1.09 (*)    GFR, Estimated 55 (*)    All other components within normal limits  RESP PANEL BY RT-PCR (FLU A&B,  COVID) ARPGX2    EKG EKG Interpretation  Date/Time:  Thursday Oct 06 2020 20:08:47 EDT Ventricular Rate:  109 PR Interval:  194 QRS Duration: 79 QT Interval:  327 QTC Calculation: 441 R Axis:   22 Text Interpretation: Sinus tachycardia Low voltage, precordial leads Confirmed by Noemi Chapel [phone]) on 10/06/2020 10:13:43 PM   Radiology DG Chest 2 View  Result Date: 10/06/2020 CLINICAL DATA:  Shortness of breath over the last 2 weeks with cough EXAM: CHEST - 2 VIEW COMPARISON:  CT 04/18/2020 FINDINGS: Heart size is normal. Mediastinal shadows are normal. Bilateral chronic pulmonary scarring and bronchiectasis as seen on the previous CT. No identifiable new or acute infiltrate. No heart failure or effusion. IMPRESSION: Chronic pulmonary scarring as shown by the previous CT study. I do not identify any new or acute pulmonary pathology. Electronically Signed   By: Nelson Chimes M.D.   On: 10/06/2020 20:45    Procedures Procedures   Medications Ordered in ED Medications  albuterol (VENTOLIN HFA) 108 (90 Base) MCG/ACT inhaler 4 puff (4 puffs Inhalation Given 10/06/20 2058)  methylPREDNISolone sodium succinate (SOLU-MEDROL) 125 mg/2 mL injection 125 mg (125 mg Intravenous Given 10/06/20 2137)  ipratropium-albuterol (DUONEB) 0.5-2.5 (3) MG/3ML nebulizer solution 3 mL (3 mLs Nebulization Given 10/06/20 2336)    ED Course  I have reviewed the triage vital signs and the nursing notes.  Pertinent labs & imaging results that were available during my care of the patient were reviewed by me and considered in my medical decision making (see chart for details).    MDM Rules/Calculators/A&P                          Patient with a 2-week history of increasing wheezing and shortness of breath.  Although she has a history of PE she denies chest pain, pleuritic symptoms.  Symptoms  have been gradual in onset, not suggestive of PE presentation.  She was given IV Solu-Medrol and an albuterol MDI while  awaiting COVID results.  She had moderate improvement with increased aeration on repeat lung exam after the MDI treatment.  She still sounded fairly tight at her bilateral bases with occasional scattered wheezing throughout upper lung fields.  A full neb treatment including Atrovent and albuterol were then ordered.  Patient is actively receiving her albuterol and Atrovent neb at this time.  Patient signed out to Dr. Stark Jock who assumes care of patient. Final Clinical Impression(s) / ED Diagnoses Final diagnoses:  Moderate asthma with exacerbation, unspecified whether persistent    Rx / DC Orders ED Discharge Orders         Ordered    predniSONE (DELTASONE) 50 MG tablet        10/07/20 0004           Evalee Jefferson, PA-C 10/07/20 0007    Noemi Chapel, MD 10/11/20 2113

## 2020-10-07 MED ORDER — PREDNISONE 50 MG PO TABS: ORAL_TABLET | ORAL | 0 refills | Status: DC

## 2020-10-07 NOTE — Discharge Instructions (Addendum)
Take your next dose of the prednisone tablet tomorrow evening with your meal.  Use your inhaler taking 2 puffs every 4 hours if needed for return of wheezing or shortness of breath.  Return here or call your primary doctor if you have worsening symptoms.

## 2020-10-12 ENCOUNTER — Other Ambulatory Visit: Payer: Self-pay | Admitting: Radiation Therapy

## 2020-10-12 NOTE — Progress Notes (Signed)
Kaitlin [name], [place] [id number]   CLINIC:  Medical Oncology/Hematology  PCP:  [name], MD [address] / [place] [address] [phone]   REASON FOR VISIT:  Follow-up for metastatic left lung cancer to brain  PRIOR THERAPY:  1. Left lungRT30 Gy in 10 fractions from 07/16/2018 to 07/29/2018. 2. Whole brainRT30 Gy in 10 fractions from 07/16/2018 to 07/29/2018. 3. Whole brain RT 30 Gy in 15 fractions from 07/12/2020 to 07/29/2020.  NGS Results: Guardant 360 EGFR exon 19 deletion  CURRENT THERAPY: Tagrisso 80 mg daily  BRIEF ONCOLOGIC HISTORY:  Oncology History   No history exists.    CANCER STAGING: Cancer Staging No matching staging information was found for the patient.  INTERVAL HISTORY:  Kaitlin [name], a 70 y.o. female, returns for routine follow-up of Kaitlin [name] metastatic left lung cancer to brain. Kaitlin [name] was last seen on 09/14/2020.   Today Kaitlin [name] reports feeling okay. Kaitlin [name] denies n/v/d/c. Kaitlin [name] is able to walk with a walker. Kaitlin [name] reports a decreased appetite, but sufficient fluid intake. Kaitlin [name] denies vision changes. Kaitlin [name] went to the ER for SOB, but it has improved with medication. Kaitlin [name] reports spasms in Kaitlin [name] legs that existed prior to radiation therapy.   REVIEW OF SYSTEMS:  Review of Systems  Constitutional: Positive for appetite change (25%) and fatigue (50%).  Eyes: Negative for eye problems.  Respiratory: Positive for cough.   Gastrointestinal: Negative for constipation, diarrhea, nausea and vomiting.  Musculoskeletal: Positive for myalgias (L leg 7/10).  All other systems reviewed and are negative.   PAST MEDICAL/SURGICAL HISTORY:  Past Medical History:  Diagnosis Date  . Asthma   . Cancer ([place]) 04/21/2018   Lung Cancer   . Carpal tunnel syndrome   . Cataract    left eye  . Diabetes mellitus without complication ([place])   . DVT (deep venous thrombosis) ([facility])   . Hypertension   . Pneumonia 04/21/2018  .  Pulmonary embolism ([place]) 03/2018  . Sepsis [facility])    Past Surgical History:  Procedure Laterality Date  . ABDOMINAL HYSTERECTOMY    . biopsy of right breast    . ESOPHAGOGASTRODUODENOSCOPY (EGD) WITH PROPOFOL N/A 09/12/2018   Procedure: ESOPHAGOGASTRODUODENOSCOPY (EGD) WITH PROPOFOL (scope in 1017, scope out 1032);  Surgeon: Virl Cagey, MD;  Location: [facility];  Service: General;  Laterality: N/A;  . FOOT SURGERY    . implant left eye    . INTRAOCULAR LENS INSERTION    . IR GASTROSTOMY TUBE MOD SED  12/05/2018  . PEG PLACEMENT N/A 09/12/2018   Procedure: PERCUTANEOUS ENDOSCOPIC GASTROSTOMY (PEG) PLACEMENT;  Surgeon: Virl Cagey, MD;  Location: [facility];  Service: General;  Laterality: N/A;    SOCIAL HISTORY:  Social History   Socioeconomic History  . Marital status: Widowed    Spouse name: Not on file  . Number of children: 4  . Years of education: 84  . Highest education level: High school graduate  Occupational History  . Not on file  Tobacco Use  . Smoking status: Never Smoker  . Smokeless tobacco: Never Used  Vaping Use  . Vaping Use: Never used  Substance and Sexual Activity  . Alcohol use: Not Currently    Comment: occ  . Drug use: No  . Sexual activity: Yes    Partners: Male    Birth control/protection: Surgical  Other Topics Concern  . 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   Social Determinants of Health   Financial Resource Strain: Not on file  Food Insecurity: Not on file  Transportation Needs: Not on file  Physical Activity: Not on file  Stress: Not on file  Social Connections: Not on file  Intimate Partner Violence: Not on file    FAMILY HISTORY:  Family History  Problem Relation Age of Onset  . Heart failure Mother   . Stroke  Mother   . Heart attack Mother   . Kidney failure Other   . Leukemia Father     CURRENT MEDICATIONS:  Current Outpatient Medications  Medication Sig Dispense Refill  . albuterol (PROVENTIL HFA;VENTOLIN HFA) 108 (90 BASE) MCG/ACT inhaler Inhale 2 puffs into the lungs every 6 (six) hours as needed for wheezing.    Marland Kitchen amLODipine (NORVASC) 10 MG tablet Take 10 mg by mouth daily.    . Colchicine (MITIGARE) 0.6 MG CAPS Take 0.6 mg by mouth daily as needed. 15 capsule 3  . enoxaparin (LOVENOX) 150 MG/ML injection Inject 1 mL (150 mg total) into the skin daily. 0 mL   . hydrocortisone (ANUSOL-HC) 2.5 % rectal cream  (Patient not taking: Reported on 09/29/2020)    . latanoprost (XALATAN) 0.005 % ophthalmic solution Place 1 drop into both eyes in the morning and at bedtime.     Marland Kitchen osimertinib mesylate (TAGRISSO) 40 MG tablet Take 2 tablets (80 mg total) by mouth daily. 60 tablet 3  . pantoprazole (PROTONIX) 40 MG tablet TAKE 1 TABLET BY MOUTH ONCE DAILY. 30 tablet 0  . potassium chloride (KLOR-CON) 10 MEQ tablet TAKE (2) TABLETS BY MOUTH TWICE DAILY. (Patient taking differently: Patient states that Kaitlin [name] is taking 132meq) 120 tablet 3  . predniSONE (DELTASONE) 50 MG tablet Take one tablet daily for 5 days 5 tablet 0  . zolpidem (AMBIEN CR) 6.25 MG CR tablet Take 1 tablet (6.25 mg total) by mouth at bedtime as needed for sleep. 30 tablet 0   No current facility-administered medications for this visit.    ALLERGIES:  Allergies  Allergen Reactions  . Banana Anaphylaxis  . Aspirin Other (See Comments)    G.I. Upset     PHYSICAL EXAM:  Performance status (ECOG): 1 - Symptomatic but completely ambulatory  There were no vitals filed for this visit. Wt Readings from Last 3 Encounters:  10/06/20 180 lb (81.6 kg)  09/14/20 197 lb 8 oz (89.6 kg)  08/17/20 186 lb 14.4 oz (84.8 kg)   Physical Exam Vitals reviewed.  Constitutional:      Appearance: Normal appearance.  Cardiovascular:     Rate and  Rhythm: Normal rate and regular rhythm.     Pulses: Normal pulses.     Heart sounds: Normal heart sounds.  Pulmonary:     Effort: Pulmonary effort is normal.     Breath sounds: Normal breath sounds.  Neurological:     General: No focal deficit present.     Mental Status: Kaitlin [name] is alert and oriented to person, place, and time.  Psychiatric:        Mood and Affect: Mood normal.        Behavior: Behavior normal.      LABORATORY DATA:  I have reviewed the labs as listed.  CBC Latest Ref Rng & Units 10/06/2020 09/14/2020  08/17/2020  WBC 4.0 - 10.5 K/uL 4.7 4.8 4.4  Hemoglobin 12.0 - 15.0 g/dL 15.4(H) 14.5 15.1(H)  Hematocrit 36.0 - 46.0 % 47.2(H) 45.8 47.0(H)  Platelets 150 - 400 K/uL 219 184 183   CMP Latest Ref Rng & Units 10/06/2020 09/14/2020 08/17/2020  Glucose 70 - 99 mg/dL 94 81 105(H)  BUN 8 - 23 mg/dL $Remove'9 12 11  'vXpZbWT$ Creatinine 0.44 - 1.00 mg/dL 1.09(H) 0.96 1.03(H)  Sodium 135 - 145 mmol/L 134(L) 138 138  Potassium 3.5 - 5.1 mmol/L 3.9 3.9 3.7  Chloride 98 - 111 mmol/L 100 105 104  CO2 22 - 32 mmol/L $RemoveB'24 23 22  'PeThIZHr$ Calcium 8.9 - 10.3 mg/dL 10.0 9.7 9.9  Total Protein 6.5 - 8.1 g/dL - 7.5 7.3  Total Bilirubin 0.3 - 1.2 mg/dL - 0.7 0.4  Alkaline Phos 38 - 126 U/L - 70 80  AST 15 - 41 U/L - 17 17  ALT 0 - 44 U/L - 19 16    DIAGNOSTIC IMAGING:  I have independently reviewed the scans and discussed with the patient. DG Chest 2 View  Result Date: 10/06/2020 CLINICAL DATA:  Shortness of breath over the last 2 weeks with cough EXAM: CHEST - 2 VIEW COMPARISON:  CT 04/18/2020 FINDINGS: Heart size is normal. Mediastinal shadows are normal. Bilateral chronic pulmonary scarring and bronchiectasis as seen on the previous CT. No identifiable new or acute infiltrate. No heart failure or effusion. IMPRESSION: Chronic pulmonary scarring as shown by the previous CT study. I do not identify any new or acute pulmonary pathology. Electronically Signed   By: Nelson Chimes M.D.   On: 10/06/2020 20:45   MR BRAIN  WO CONTRAST  Result Date: 10/02/2020 CLINICAL DATA:  70 year old female with a history of metastatic non-small cell lung cancer. Brain metastases treated with whole brain radiation. Suspicion of new right hemisphere leptomeningeal disease in January. Unable to obtain IV access today. EXAM: MRI HEAD WITHOUT CONTRAST TECHNIQUE: Multiplanar, multiecho pulse sequences of the brain and surrounding structures were obtained without intravenous contrast. COMPARISON:  Brain MRI 06/24/2020 and earlier. FINDINGS: Noncontrast imaging today consisting of DWI, FLAIR, SWI and noncontrast T1 weighted imaging. Right parietal lobe small focus of T2 shine through on diffusion is stable since January (series 3, image 41 today), although there is new white matter FLAIR hyperintensity there on series 4, image 48. Two small foci of abnormal right centrum semi of bowel white matter diffusion on series 3, image 37 are new since January, and the more anterior of these appears restricted on ADC, with T2 shine through at the posterior lesion. No other abnormal diffusion. Patchy and vague bilateral cerebral white matter FLAIR hyperintensity shows mild generalized progression since January, along with several new areas of discrete cystic white matter encephalomalacia now (series 4, image 40 posterior to the right lateral ventricle) and these may all be the sequelae of XRT. Focal new right parietal lobe subcortical white matter FLAIR on series 4, image 48 (versus series 4, image 40 in January) as stated above - and correlating with prior postcontrast imaging this is 1 site of leptomeningeal enhancement seen in January. But no other focal or suspicious increased FLAIR signal. SWI appears stable, with occasional punctate foci of hemosiderin which are probably post treatment related. Axial T1 weighted imaging suggests a nonacute lacunar infarct in the right paracentral pons on series 600, image 154, new since January. No midline shift, mass effect,  ventriculomegaly, extra-axial collection or acute intracranial hemorrhage. Cervicomedullary junction and pituitary are  within normal limits. Visualized bone marrow signal is within normal limits. Stable visible cervical spine degeneration. Stable paranasal sinuses and mastoids. IMPRESSION: 1. No IV access could be obtained, thus this restaging exam is non-contrast and limited. 2. Small area of new vasogenic edema in the right parietal lobe corresponds to one site of leptomeningeal enhancement in January, suggesting some progression. 3. But other bilateral increased white matter FLAIR signal, as well as two small foci of abnormal diffusion in the right centrum semiovale more resembles sequelae of small vessel disease/XRT. 4. No intracranial mass effect or other interval finding. Electronically Signed   By: Genevie Ann M.D.   On: 10/02/2020 05:32     ASSESSMENT:  1. Metastatic adenocarcinoma of the left lung with multiple brain mets: -Guardant 360 with EGFR exon 19 deletion. Patient never smoker. -Osimertinib 80 mg daily started on 07/28/2018, dose reduced due to weight loss and decreased appetite in July 2020. -CT chest on 11/25/2019 shows stable disease with no evidence of recurrence or metastasis. -CT chest, abdomen and pelvis on 04/18/2020 with no findings of recurrent malignancy. Prior radiation therapy changes. Stable trace left pleural effusion. -PET scan from 07/07/2020 showed low-grade activity in the lungs consistent with radiation fibrosis and volume loss. No evidence of recurrence or metastatic disease outside the brain.  2. Brain metastasis: -20 subcentimeter brain lesions and diagnosis. -Whole brain RT completed on 07/29/2018. -MRI of the brain with and without contrast on 09/12/2019 showed increased conspicuity of a 3 mm focus of enhancement at site of treated anterior left temporal lobe lesion. Increased conspicuity is related to improved imaging quality on the present exam. No new  intracranial metastasis. -MRI of the brain with and without contrast on 12/21/2019 with no evidence of new or progressive disease. Minimal punctate enhancement remains visible at the site of previously treated left temporal lesion. 4 mm meningioma at the right frontoparietal vertex and 8 mm meningioma at the left anterior cranial fossa convexity are unchanged without mass-effect upon the brain. 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 New 2 mm enhancing lesion in the left. Occipital white matter suspicious for meta stasis. Minimally increased size of the treated left parietal metastasis. Additional punctate foci of cerebral enhancement bilaterally corresponding to previously treated mets. -30 Gray in 15 fractions completed on 07/29/2020.   PLAN:  1. Metastatic adenocarcinoma of the left lung with multiple brain mets: -PET scan on 07/07/2020 did not show any evidence of recurrence outside the brain. - Kaitlin [name] is currently taking osimertinib 80 mg daily. - Kaitlin [name] does not report any major side effects other than decreased appetite. - Chronic cough has been stable.  Kaitlin [name] is ambulating with the help of walker. - We reviewed Kaitlin [name] labs today.  Creatinine is mildly elevated at 1.25.  LFTs are normal.  CBC was grossly normal. - I have recommended increasing osimertinib to 120 mg daily.  Target dose for leptomeningeal disease is 160 mg daily. - I plan to reevaluate Kaitlin [name] in 3 weeks to see how Kaitlin [name] is tolerating it.  2. Brain metastasis: -Kaitlin [name] completed radiation therapy on 07/29/2020. - Kaitlin [name] had brain MRI on 09/30/2020 which showed small area of new vasogenic edema in the right parietal lobe.  This will be followed closely by Dr. Johny Shears team.  3. Recurrent PE: -Continue Lovenox injections daily.  No bleeding issues reported.  4.  Loss of appetite: -Kaitlin [name] reports that Kaitlin [name] appetite  has completely  decreased. - We will start Kaitlin [name] on Megace 400 mg (10 mL) p.o. twice daily.   Orders placed this encounter:  No orders of the defined types were placed in this encounter.    Derek Jack, MD [place] [phone]   I, Thana Ates, am acting as a scribe for Dr. Derek Jack.  I, Derek Jack MD, have reviewed the above documentation for accuracy and completeness, and I agree with the above.

## 2020-10-13 ENCOUNTER — Ambulatory Visit (HOSPITAL_COMMUNITY): Payer: Medicare Other | Admitting: Hematology

## 2020-10-13 ENCOUNTER — Inpatient Hospital Stay (HOSPITAL_BASED_OUTPATIENT_CLINIC_OR_DEPARTMENT_OTHER): Payer: Medicare Other | Admitting: Hematology

## 2020-10-13 ENCOUNTER — Inpatient Hospital Stay (HOSPITAL_COMMUNITY): Payer: Medicare Other

## 2020-10-13 ENCOUNTER — Inpatient Hospital Stay (HOSPITAL_COMMUNITY): Payer: Medicare Other | Attending: Hematology

## 2020-10-13 ENCOUNTER — Other Ambulatory Visit: Payer: Self-pay

## 2020-10-13 VITALS — BP 130/80 | HR 89 | Temp 97.0°F | Resp 18 | Wt 187.6 lb

## 2020-10-13 DIAGNOSIS — C3490 Malignant neoplasm of unspecified part of unspecified bronchus or lung: Secondary | ICD-10-CM

## 2020-10-13 DIAGNOSIS — C3492 Malignant neoplasm of unspecified part of left bronchus or lung: Secondary | ICD-10-CM | POA: Insufficient documentation

## 2020-10-13 DIAGNOSIS — Z930 Tracheostomy status: Secondary | ICD-10-CM | POA: Diagnosis not present

## 2020-10-13 DIAGNOSIS — R634 Abnormal weight loss: Secondary | ICD-10-CM | POA: Diagnosis not present

## 2020-10-13 DIAGNOSIS — Z806 Family history of leukemia: Secondary | ICD-10-CM | POA: Insufficient documentation

## 2020-10-13 DIAGNOSIS — C7931 Secondary malignant neoplasm of brain: Secondary | ICD-10-CM | POA: Insufficient documentation

## 2020-10-13 DIAGNOSIS — Z7901 Long term (current) use of anticoagulants: Secondary | ICD-10-CM | POA: Diagnosis not present

## 2020-10-13 DIAGNOSIS — Z90710 Acquired absence of both cervix and uterus: Secondary | ICD-10-CM | POA: Insufficient documentation

## 2020-10-13 DIAGNOSIS — I2699 Other pulmonary embolism without acute cor pulmonale: Secondary | ICD-10-CM | POA: Insufficient documentation

## 2020-10-13 DIAGNOSIS — G9389 Other specified disorders of brain: Secondary | ICD-10-CM | POA: Insufficient documentation

## 2020-10-13 DIAGNOSIS — Z79899 Other long term (current) drug therapy: Secondary | ICD-10-CM | POA: Insufficient documentation

## 2020-10-13 LAB — CBC WITH DIFFERENTIAL/PLATELET
Abs Immature Granulocytes: 0.05 10*3/uL (ref 0.00–0.07)
Basophils Absolute: 0.1 10*3/uL (ref 0.0–0.1)
Basophils Relative: 1 %
Eosinophils Absolute: 0.1 10*3/uL (ref 0.0–0.5)
Eosinophils Relative: 1 %
HCT: 47.4 % — ABNORMAL HIGH (ref 36.0–46.0)
Hemoglobin: 15.2 g/dL — ABNORMAL HIGH (ref 12.0–15.0)
Immature Granulocytes: 1 %
Lymphocytes Relative: 9 %
Lymphs Abs: 0.9 10*3/uL (ref 0.7–4.0)
MCH: 28.3 pg (ref 26.0–34.0)
MCHC: 32.1 g/dL (ref 30.0–36.0)
MCV: 88.3 fL (ref 80.0–100.0)
Monocytes Absolute: 0.9 10*3/uL (ref 0.1–1.0)
Monocytes Relative: 9 %
Neutro Abs: 7.8 10*3/uL — ABNORMAL HIGH (ref 1.7–7.7)
Neutrophils Relative %: 79 %
Platelets: 218 10*3/uL (ref 150–400)
RBC: 5.37 MIL/uL — ABNORMAL HIGH (ref 3.87–5.11)
RDW: 14.6 % (ref 11.5–15.5)
WBC: 9.7 10*3/uL (ref 4.0–10.5)
nRBC: 0 % (ref 0.0–0.2)

## 2020-10-13 LAB — COMPREHENSIVE METABOLIC PANEL
ALT: 20 U/L (ref 0–44)
AST: 22 U/L (ref 15–41)
Albumin: 4.4 g/dL (ref 3.5–5.0)
Alkaline Phosphatase: 79 U/L (ref 38–126)
Anion gap: 11 (ref 5–15)
BUN: 18 mg/dL (ref 8–23)
CO2: 22 mmol/L (ref 22–32)
Calcium: 9.8 mg/dL (ref 8.9–10.3)
Chloride: 100 mmol/L (ref 98–111)
Creatinine, Ser: 1.25 mg/dL — ABNORMAL HIGH (ref 0.44–1.00)
GFR, Estimated: 47 mL/min — ABNORMAL LOW (ref >60)
Glucose, Bld: 112 mg/dL — ABNORMAL HIGH (ref 70–99)
Potassium: 3.5 mmol/L (ref 3.5–5.1)
Sodium: 133 mmol/L — ABNORMAL LOW (ref 135–145)
Total Bilirubin: 0.6 mg/dL (ref 0.3–1.2)
Total Protein: 7.7 g/dL (ref 6.5–8.1)

## 2020-10-13 LAB — MAGNESIUM: Magnesium: 2 mg/dL (ref 1.7–2.4)

## 2020-10-13 MED ORDER — OSIMERTINIB MESYLATE 40 MG PO TABS: 120.0000 mg | ORAL_TABLET | Freq: Every day | ORAL | 3 refills | Status: DC

## 2020-10-13 MED ORDER — MEGESTROL ACETATE 400 MG/10ML PO SUSP: 400.0000 mg | Freq: Two times a day (BID) | ORAL | 3 refills | Status: DC

## 2020-10-13 NOTE — Patient Instructions (Signed)
[place] at  Medical Center Discharge Instructions  You were seen today by Dr. Delton Coombes. He went over your recent results. Dr. Delton Coombes will see you back in 3 weeks for labs and follow up.   Thank you for choosing [place] at [facility] to provide your oncology and hematology care.  To afford each patient quality time with our provider, please arrive at least 15 minutes before your scheduled appointment time.   If you have a lab appointment with the [facility] please come in thru the Main Entrance and check in at the main information desk  You need to re-schedule your appointment should you arrive 10 or more minutes late.  We strive to give you quality time with our providers, and arriving late affects you and other patients whose appointments are after yours.  Also, if you no show three or more times for appointments you may be dismissed from the clinic at the providers discretion.     Again, thank you for choosing [facility].  Our hope is that these requests will decrease the amount of time that you wait before being seen by our physicians.       _____________________________________________________________  Should you have questions after your visit to [facility], please contact our office at [phone] between the hours of 8:00 a.m. and 4:30 p.m.  Voicemails left after 4:00 p.m. will not be returned until the following business day.  For prescription refill requests, have your pharmacy contact our office and allow 72 hours.    Cancer Center Support Programs:   > Cancer Support Group  2nd Tuesday of the month 1pm-2pm, Journey Room

## 2020-10-25 DIAGNOSIS — I10 Essential (primary) hypertension: Secondary | ICD-10-CM | POA: Diagnosis not present

## 2020-10-25 DIAGNOSIS — J4540 Moderate persistent asthma, uncomplicated: Secondary | ICD-10-CM | POA: Diagnosis not present

## 2020-10-26 ENCOUNTER — Other Ambulatory Visit: Payer: Self-pay | Admitting: Radiation Therapy

## 2020-10-26 DIAGNOSIS — C7931 Secondary malignant neoplasm of brain: Secondary | ICD-10-CM

## 2020-11-01 ENCOUNTER — Telehealth: Payer: Self-pay | Admitting: Radiation Therapy

## 2020-11-01 NOTE — Telephone Encounter (Signed)
Spoke with pt about her upcoming brain MRI and follow-up with Ashlyn. Rather than write the appointment info down, she has an appointment to see Dr. Delton Coombes this upcoming Monday 6/13. She plans to get an updated AVS with these appointments listed and will call me if she has any questions or conflicts.   Mont Dutton R.T.(R)(T) Radiation Special Procedures Navigator

## 2020-11-03 ENCOUNTER — Inpatient Hospital Stay (HOSPITAL_COMMUNITY)
Admission: EM | Admit: 2020-11-03 | Discharge: 2020-11-08 | DRG: 329 | Disposition: A | Discharge status: Hospice | Payer: Medicare Other | Attending: Pulmonary Disease | Admitting: Pulmonary Disease

## 2020-11-03 ENCOUNTER — Inpatient Hospital Stay (HOSPITAL_COMMUNITY): Payer: Medicare Other

## 2020-11-03 ENCOUNTER — Emergency Department (HOSPITAL_COMMUNITY): Payer: Medicare Other

## 2020-11-03 ENCOUNTER — Encounter (HOSPITAL_COMMUNITY): Payer: Self-pay | Admitting: *Deleted

## 2020-11-03 ENCOUNTER — Encounter (HOSPITAL_COMMUNITY)
Admission: EM | Disposition: A | Discharge status: Hospice | Payer: Self-pay | Source: Home / Self Care | Attending: Pulmonary Disease

## 2020-11-03 ENCOUNTER — Other Ambulatory Visit: Payer: Self-pay

## 2020-11-03 ENCOUNTER — Emergency Department (HOSPITAL_COMMUNITY): Payer: Medicare Other | Admitting: Anesthesiology

## 2020-11-03 DIAGNOSIS — I63031 Cerebral infarction due to thrombosis of right carotid artery: Secondary | ICD-10-CM | POA: Diagnosis not present

## 2020-11-03 DIAGNOSIS — E785 Hyperlipidemia, unspecified: Secondary | ICD-10-CM | POA: Diagnosis present

## 2020-11-03 DIAGNOSIS — G4089 Other seizures: Secondary | ICD-10-CM | POA: Diagnosis not present

## 2020-11-03 DIAGNOSIS — K5669 Other partial intestinal obstruction: Principal | ICD-10-CM | POA: Diagnosis present

## 2020-11-03 DIAGNOSIS — K55029 Acute infarction of small intestine, extent unspecified: Secondary | ICD-10-CM | POA: Diagnosis not present

## 2020-11-03 DIAGNOSIS — Z7401 Bed confinement status: Secondary | ICD-10-CM | POA: Diagnosis not present

## 2020-11-03 DIAGNOSIS — Z90710 Acquired absence of both cervix and uterus: Secondary | ICD-10-CM

## 2020-11-03 DIAGNOSIS — I639 Cerebral infarction, unspecified: Secondary | ICD-10-CM | POA: Diagnosis not present

## 2020-11-03 DIAGNOSIS — Z7901 Long term (current) use of anticoagulants: Secondary | ICD-10-CM

## 2020-11-03 DIAGNOSIS — I10 Essential (primary) hypertension: Secondary | ICD-10-CM | POA: Diagnosis not present

## 2020-11-03 DIAGNOSIS — Z515 Encounter for palliative care: Secondary | ICD-10-CM

## 2020-11-03 DIAGNOSIS — Z01818 Encounter for other preprocedural examination: Secondary | ICD-10-CM | POA: Diagnosis not present

## 2020-11-03 DIAGNOSIS — R29717 NIHSS score 17: Secondary | ICD-10-CM | POA: Diagnosis present

## 2020-11-03 DIAGNOSIS — Z6830 Body mass index (BMI) 30.0-30.9, adult: Secondary | ICD-10-CM

## 2020-11-03 DIAGNOSIS — Z85118 Personal history of other malignant neoplasm of bronchus and lung: Secondary | ICD-10-CM

## 2020-11-03 DIAGNOSIS — K219 Gastro-esophageal reflux disease without esophagitis: Secondary | ICD-10-CM | POA: Diagnosis present

## 2020-11-03 DIAGNOSIS — E119 Type 2 diabetes mellitus without complications: Secondary | ICD-10-CM

## 2020-11-03 DIAGNOSIS — I6503 Occlusion and stenosis of bilateral vertebral arteries: Secondary | ICD-10-CM | POA: Diagnosis not present

## 2020-11-03 DIAGNOSIS — J9621 Acute and chronic respiratory failure with hypoxia: Secondary | ICD-10-CM | POA: Diagnosis not present

## 2020-11-03 DIAGNOSIS — E1169 Type 2 diabetes mellitus with other specified complication: Secondary | ICD-10-CM | POA: Diagnosis not present

## 2020-11-03 DIAGNOSIS — K55019 Acute (reversible) ischemia of small intestine, extent unspecified: Secondary | ICD-10-CM | POA: Diagnosis not present

## 2020-11-03 DIAGNOSIS — G9341 Metabolic encephalopathy: Secondary | ICD-10-CM | POA: Diagnosis not present

## 2020-11-03 DIAGNOSIS — R112 Nausea with vomiting, unspecified: Secondary | ICD-10-CM | POA: Diagnosis present

## 2020-11-03 DIAGNOSIS — Z66 Do not resuscitate: Secondary | ICD-10-CM | POA: Diagnosis not present

## 2020-11-03 DIAGNOSIS — I6603 Occlusion and stenosis of bilateral middle cerebral arteries: Secondary | ICD-10-CM | POA: Diagnosis not present

## 2020-11-03 DIAGNOSIS — I6611 Occlusion and stenosis of right anterior cerebral artery: Secondary | ICD-10-CM | POA: Diagnosis not present

## 2020-11-03 DIAGNOSIS — Z20822 Contact with and (suspected) exposure to covid-19: Secondary | ICD-10-CM | POA: Diagnosis not present

## 2020-11-03 DIAGNOSIS — I672 Cerebral atherosclerosis: Secondary | ICD-10-CM | POA: Diagnosis not present

## 2020-11-03 DIAGNOSIS — G936 Cerebral edema: Secondary | ICD-10-CM | POA: Diagnosis present

## 2020-11-03 DIAGNOSIS — I63411 Cerebral infarction due to embolism of right middle cerebral artery: Secondary | ICD-10-CM | POA: Diagnosis not present

## 2020-11-03 DIAGNOSIS — I82409 Acute embolism and thrombosis of unspecified deep veins of unspecified lower extremity: Secondary | ICD-10-CM | POA: Diagnosis present

## 2020-11-03 DIAGNOSIS — C7931 Secondary malignant neoplasm of brain: Secondary | ICD-10-CM | POA: Diagnosis not present

## 2020-11-03 DIAGNOSIS — J984 Other disorders of lung: Secondary | ICD-10-CM | POA: Diagnosis not present

## 2020-11-03 DIAGNOSIS — I824Y9 Acute embolism and thrombosis of unspecified deep veins of unspecified proximal lower extremity: Secondary | ICD-10-CM | POA: Diagnosis not present

## 2020-11-03 DIAGNOSIS — I6389 Other cerebral infarction: Secondary | ICD-10-CM | POA: Diagnosis not present

## 2020-11-03 DIAGNOSIS — T380X5A Adverse effect of glucocorticoids and synthetic analogues, initial encounter: Secondary | ICD-10-CM | POA: Diagnosis present

## 2020-11-03 DIAGNOSIS — Z86718 Personal history of other venous thrombosis and embolism: Secondary | ICD-10-CM

## 2020-11-03 DIAGNOSIS — J449 Chronic obstructive pulmonary disease, unspecified: Secondary | ICD-10-CM | POA: Diagnosis present

## 2020-11-03 DIAGNOSIS — C50919 Malignant neoplasm of unspecified site of unspecified female breast: Secondary | ICD-10-CM | POA: Diagnosis not present

## 2020-11-03 DIAGNOSIS — R109 Unspecified abdominal pain: Secondary | ICD-10-CM | POA: Diagnosis not present

## 2020-11-03 DIAGNOSIS — Z0189 Encounter for other specified special examinations: Secondary | ICD-10-CM

## 2020-11-03 DIAGNOSIS — D72829 Elevated white blood cell count, unspecified: Secondary | ICD-10-CM | POA: Diagnosis present

## 2020-11-03 DIAGNOSIS — Z923 Personal history of irradiation: Secondary | ICD-10-CM

## 2020-11-03 DIAGNOSIS — E0965 Drug or chemical induced diabetes mellitus with hyperglycemia: Secondary | ICD-10-CM | POA: Diagnosis present

## 2020-11-03 DIAGNOSIS — Z9221 Personal history of antineoplastic chemotherapy: Secondary | ICD-10-CM

## 2020-11-03 DIAGNOSIS — I6521 Occlusion and stenosis of right carotid artery: Secondary | ICD-10-CM | POA: Diagnosis not present

## 2020-11-03 DIAGNOSIS — Z86711 Personal history of pulmonary embolism: Secondary | ICD-10-CM

## 2020-11-03 DIAGNOSIS — E669 Obesity, unspecified: Secondary | ICD-10-CM | POA: Diagnosis present

## 2020-11-03 DIAGNOSIS — Z823 Family history of stroke: Secondary | ICD-10-CM | POA: Diagnosis not present

## 2020-11-03 DIAGNOSIS — Z806 Family history of leukemia: Secondary | ICD-10-CM | POA: Diagnosis not present

## 2020-11-03 DIAGNOSIS — K6389 Other specified diseases of intestine: Secondary | ICD-10-CM | POA: Diagnosis not present

## 2020-11-03 DIAGNOSIS — Z4682 Encounter for fitting and adjustment of non-vascular catheter: Secondary | ICD-10-CM | POA: Diagnosis not present

## 2020-11-03 DIAGNOSIS — R5381 Other malaise: Secondary | ICD-10-CM | POA: Diagnosis not present

## 2020-11-03 DIAGNOSIS — I2699 Other pulmonary embolism without acute cor pulmonale: Secondary | ICD-10-CM | POA: Diagnosis present

## 2020-11-03 DIAGNOSIS — I63511 Cerebral infarction due to unspecified occlusion or stenosis of right middle cerebral artery: Secondary | ICD-10-CM | POA: Diagnosis not present

## 2020-11-03 DIAGNOSIS — K56609 Unspecified intestinal obstruction, unspecified as to partial versus complete obstruction: Secondary | ICD-10-CM | POA: Insufficient documentation

## 2020-11-03 DIAGNOSIS — E43 Unspecified severe protein-calorie malnutrition: Secondary | ICD-10-CM | POA: Diagnosis present

## 2020-11-03 DIAGNOSIS — J9611 Chronic respiratory failure with hypoxia: Secondary | ICD-10-CM | POA: Diagnosis not present

## 2020-11-03 DIAGNOSIS — R1110 Vomiting, unspecified: Secondary | ICD-10-CM | POA: Diagnosis not present

## 2020-11-03 DIAGNOSIS — I161 Hypertensive emergency: Secondary | ICD-10-CM | POA: Diagnosis not present

## 2020-11-03 DIAGNOSIS — Z7952 Long term (current) use of systemic steroids: Secondary | ICD-10-CM

## 2020-11-03 DIAGNOSIS — I6523 Occlusion and stenosis of bilateral carotid arteries: Secondary | ICD-10-CM | POA: Diagnosis not present

## 2020-11-03 DIAGNOSIS — G9349 Other encephalopathy: Secondary | ICD-10-CM | POA: Diagnosis not present

## 2020-11-03 DIAGNOSIS — Z79899 Other long term (current) drug therapy: Secondary | ICD-10-CM

## 2020-11-03 HISTORY — PX: LAPAROTOMY: SHX154

## 2020-11-03 LAB — RESP PANEL BY RT-PCR (FLU A&B, COVID) ARPGX2
Influenza A by PCR: NEGATIVE
Influenza B by PCR: NEGATIVE
SARS Coronavirus 2 by RT PCR: NEGATIVE

## 2020-11-03 LAB — COMPREHENSIVE METABOLIC PANEL
ALT: 13 U/L (ref 0–44)
AST: 18 U/L (ref 15–41)
Albumin: 4.4 g/dL (ref 3.5–5.0)
Alkaline Phosphatase: 58 U/L (ref 38–126)
Anion gap: 11 (ref 5–15)
BUN: 15 mg/dL (ref 8–23)
CO2: 22 mmol/L (ref 22–32)
Calcium: 10.2 mg/dL (ref 8.9–10.3)
Chloride: 105 mmol/L (ref 98–111)
Creatinine, Ser: 0.89 mg/dL (ref 0.44–1.00)
GFR, Estimated: >60 mL/min (ref >60)
Glucose, Bld: 154 mg/dL — ABNORMAL HIGH (ref 70–99)
Potassium: 3.9 mmol/L (ref 3.5–5.1)
Sodium: 138 mmol/L (ref 135–145)
Total Bilirubin: 1.1 mg/dL (ref 0.3–1.2)
Total Protein: 7.6 g/dL (ref 6.5–8.1)

## 2020-11-03 LAB — CBC WITH DIFFERENTIAL/PLATELET
Abs Immature Granulocytes: 0.03 10*3/uL (ref 0.00–0.07)
Basophils Absolute: 0 10*3/uL (ref 0.0–0.1)
Basophils Relative: 0 %
Eosinophils Absolute: 0.1 10*3/uL (ref 0.0–0.5)
Eosinophils Relative: 1 %
HCT: 44.5 % (ref 36.0–46.0)
Hemoglobin: 14.6 g/dL (ref 12.0–15.0)
Immature Granulocytes: 0 %
Lymphocytes Relative: 10 %
Lymphs Abs: 1 10*3/uL (ref 0.7–4.0)
MCH: 28.6 pg (ref 26.0–34.0)
MCHC: 32.8 g/dL (ref 30.0–36.0)
MCV: 87.1 fL (ref 80.0–100.0)
Monocytes Absolute: 0.7 10*3/uL (ref 0.1–1.0)
Monocytes Relative: 7 %
Neutro Abs: 7.6 10*3/uL (ref 1.7–7.7)
Neutrophils Relative %: 82 %
Platelets: 234 10*3/uL (ref 150–400)
RBC: 5.11 MIL/uL (ref 3.87–5.11)
RDW: 15.4 % (ref 11.5–15.5)
WBC: 9.4 10*3/uL (ref 4.0–10.5)
nRBC: 0 % (ref 0.0–0.2)

## 2020-11-03 LAB — URINALYSIS, ROUTINE W REFLEX MICROSCOPIC
Bilirubin Urine: NEGATIVE
Glucose, UA: NEGATIVE mg/dL
Ketones, ur: 20 mg/dL — AB
Nitrite: NEGATIVE
Protein, ur: 100 mg/dL — AB
Specific Gravity, Urine: 1.027 (ref 1.005–1.030)
WBC, UA: >50 WBC/hpf — ABNORMAL HIGH (ref 0–5)
pH: 5 (ref 5.0–8.0)

## 2020-11-03 LAB — LIPASE, BLOOD: Lipase: 29 U/L (ref 11–51)

## 2020-11-03 SURGERY — LAPAROTOMY, EXPLORATORY
Anesthesia: General

## 2020-11-03 MED ORDER — IOHEXOL 9 MG/ML PO SOLN
ORAL | Status: AC
  Filled 2020-11-03: qty 500

## 2020-11-03 MED ORDER — MIDAZOLAM HCL 2 MG/2ML IJ SOLN
1.0000 mg | INTRAMUSCULAR | Status: AC | PRN
  Administered 2020-11-03 – 2020-11-04 (×3): 1 mg via INTRAVENOUS
  Filled 2020-11-03 (×3): qty 2

## 2020-11-03 MED ORDER — MIDAZOLAM HCL 2 MG/2ML IJ SOLN
INTRAMUSCULAR | Status: AC
  Filled 2020-11-03: qty 2

## 2020-11-03 MED ORDER — PHENYLEPHRINE HCL-NACL 10-0.9 MG/250ML-% IV SOLN
INTRAVENOUS | Status: AC
  Filled 2020-11-03: qty 250

## 2020-11-03 MED ORDER — FENTANYL CITRATE (PF) 100 MCG/2ML IJ SOLN: 25.0000 ug | INTRAMUSCULAR | Status: DC | PRN

## 2020-11-03 MED ORDER — ROCURONIUM BROMIDE 10 MG/ML (PF) SYRINGE
PREFILLED_SYRINGE | INTRAVENOUS | Status: AC
  Filled 2020-11-03: qty 10

## 2020-11-03 MED ORDER — SODIUM CHLORIDE 0.9 % IV SOLN
1.0000 g | Freq: Once | INTRAVENOUS | Status: AC
  Administered 2020-11-03: 1 g via INTRAVENOUS
  Filled 2020-11-03: qty 10

## 2020-11-03 MED ORDER — SUCCINYLCHOLINE CHLORIDE 200 MG/10ML IV SOSY
PREFILLED_SYRINGE | INTRAVENOUS | Status: DC | PRN
  Administered 2020-11-03: 120 mg via INTRAVENOUS

## 2020-11-03 MED ORDER — ONDANSETRON HCL 4 MG/2ML IJ SOLN
4.0000 mg | Freq: Once | INTRAMUSCULAR | Status: AC
  Administered 2020-11-03: 4 mg via INTRAVENOUS
  Filled 2020-11-03: qty 2

## 2020-11-03 MED ORDER — SODIUM CHLORIDE 0.9 % IR SOLN
Status: DC | PRN
  Administered 2020-11-03 (×2): 1000 mL

## 2020-11-03 MED ORDER — FENTANYL CITRATE (PF) 100 MCG/2ML IJ SOLN
INTRAMUSCULAR | Status: AC
  Filled 2020-11-03: qty 2

## 2020-11-03 MED ORDER — LACTATED RINGERS IV SOLN: INTRAVENOUS | Status: DC | PRN

## 2020-11-03 MED ORDER — FENTANYL CITRATE (PF) 250 MCG/5ML IJ SOLN
INTRAMUSCULAR | Status: AC
  Filled 2020-11-03: qty 5

## 2020-11-03 MED ORDER — ACETAMINOPHEN 650 MG RE SUPP: 650.0000 mg | Freq: Four times a day (QID) | RECTAL | Status: DC | PRN

## 2020-11-03 MED ORDER — LIDOCAINE HCL (PF) 2 % IJ SOLN
INTRAMUSCULAR | Status: AC
  Filled 2020-11-03: qty 5

## 2020-11-03 MED ORDER — SODIUM CHLORIDE 0.9 % IV BOLUS
1000.0000 mL | Freq: Once | INTRAVENOUS | Status: AC
  Administered 2020-11-03: 1000 mL via INTRAVENOUS

## 2020-11-03 MED ORDER — MORPHINE SULFATE (PF) 4 MG/ML IV SOLN
4.0000 mg | Freq: Once | INTRAVENOUS | Status: AC
  Administered 2020-11-03: 4 mg via INTRAVENOUS
  Filled 2020-11-03: qty 1

## 2020-11-03 MED ORDER — SODIUM CHLORIDE 0.9 % IV SOLN: INTRAVENOUS | Status: DC

## 2020-11-03 MED ORDER — ONDANSETRON 4 MG PO TBDP: 4.0000 mg | ORAL_TABLET | Freq: Four times a day (QID) | ORAL | Status: DC | PRN

## 2020-11-03 MED ORDER — ROCURONIUM BROMIDE 10 MG/ML (PF) SYRINGE
PREFILLED_SYRINGE | INTRAVENOUS | Status: DC | PRN
  Administered 2020-11-03: 60 mg via INTRAVENOUS
  Administered 2020-11-03: 40 mg via INTRAVENOUS

## 2020-11-03 MED ORDER — SUCCINYLCHOLINE CHLORIDE 200 MG/10ML IV SOSY
PREFILLED_SYRINGE | INTRAVENOUS | Status: AC
  Filled 2020-11-03: qty 10

## 2020-11-03 MED ORDER — PROPOFOL 10 MG/ML IV BOLUS
INTRAVENOUS | Status: DC | PRN
  Administered 2020-11-03: 70 mg via INTRAVENOUS

## 2020-11-03 MED ORDER — PANTOPRAZOLE SODIUM 40 MG IV SOLR
40.0000 mg | Freq: Every day | INTRAVENOUS | Status: DC
  Administered 2020-11-03 – 2020-11-06 (×4): 40 mg via INTRAVENOUS
  Filled 2020-11-03 (×4): qty 40

## 2020-11-03 MED ORDER — FENTANYL CITRATE (PF) 100 MCG/2ML IJ SOLN
25.0000 ug | INTRAMUSCULAR | Status: DC | PRN
  Administered 2020-11-04 (×6): 100 ug via INTRAVENOUS
  Filled 2020-11-03 (×6): qty 2

## 2020-11-03 MED ORDER — CHLORHEXIDINE GLUCONATE CLOTH 2 % EX PADS: 6.0000 | MEDICATED_PAD | Freq: Once | CUTANEOUS | Status: AC

## 2020-11-03 MED ORDER — IOHEXOL 300 MG/ML  SOLN
100.0000 mL | Freq: Once | INTRAMUSCULAR | Status: AC | PRN
  Administered 2020-11-03: 100 mL via INTRAVENOUS

## 2020-11-03 MED ORDER — LIDOCAINE HCL (CARDIAC) PF 100 MG/5ML IV SOSY
PREFILLED_SYRINGE | INTRAVENOUS | Status: DC | PRN
  Administered 2020-11-03: 100 mg via INTRATRACHEAL

## 2020-11-03 MED ORDER — PHENYLEPHRINE HCL-NACL 10-0.9 MG/250ML-% IV SOLN
INTRAVENOUS | Status: DC | PRN
  Administered 2020-11-03: 5 ug/min via INTRAVENOUS

## 2020-11-03 MED ORDER — BUPIVACAINE LIPOSOME 1.3 % IJ SUSP
INTRAMUSCULAR | Status: AC
  Filled 2020-11-03: qty 20

## 2020-11-03 MED ORDER — PROPOFOL 10 MG/ML IV BOLUS
INTRAVENOUS | Status: AC
  Filled 2020-11-03: qty 20

## 2020-11-03 MED ORDER — BUPIVACAINE LIPOSOME 1.3 % IJ SUSP
INTRAMUSCULAR | Status: DC | PRN
  Administered 2020-11-03: 20 mL

## 2020-11-03 MED ORDER — PHENYLEPHRINE 40 MCG/ML (10ML) SYRINGE FOR IV PUSH (FOR BLOOD PRESSURE SUPPORT)
PREFILLED_SYRINGE | INTRAVENOUS | Status: DC | PRN
  Administered 2020-11-03 (×3): 160 ug via INTRAVENOUS
  Administered 2020-11-03: 80 ug via INTRAVENOUS
  Administered 2020-11-03 (×2): 160 ug via INTRAVENOUS

## 2020-11-03 MED ORDER — MIDAZOLAM HCL 2 MG/2ML IJ SOLN
1.0000 mg | INTRAMUSCULAR | Status: DC | PRN
  Administered 2020-11-04 (×3): 1 mg via INTRAVENOUS
  Filled 2020-11-03 (×3): qty 2

## 2020-11-03 MED ORDER — FENTANYL CITRATE (PF) 250 MCG/5ML IJ SOLN
INTRAMUSCULAR | Status: DC | PRN
  Administered 2020-11-03: 100 ug via INTRAVENOUS
  Administered 2020-11-03 (×5): 50 ug via INTRAVENOUS
  Administered 2020-11-03: 100 ug via INTRAVENOUS
  Administered 2020-11-03 (×3): 50 ug via INTRAVENOUS

## 2020-11-03 MED ORDER — ONDANSETRON HCL 4 MG/2ML IJ SOLN
4.0000 mg | Freq: Four times a day (QID) | INTRAMUSCULAR | Status: DC | PRN
  Administered 2020-11-05: 4 mg via INTRAVENOUS
  Filled 2020-11-03: qty 2

## 2020-11-03 MED ORDER — METHYLPREDNISOLONE SODIUM SUCC 125 MG IJ SOLR
125.0000 mg | Freq: Once | INTRAMUSCULAR | Status: AC
  Administered 2020-11-03: 125 mg via INTRAVENOUS
  Filled 2020-11-03: qty 2

## 2020-11-03 MED ORDER — CHLORHEXIDINE GLUCONATE CLOTH 2 % EX PADS
6.0000 | MEDICATED_PAD | Freq: Once | CUTANEOUS | Status: AC
  Administered 2020-11-03: 6 via TOPICAL

## 2020-11-03 MED ORDER — METHYLPREDNISOLONE SODIUM SUCC 125 MG IJ SOLR
125.0000 mg | Freq: Two times a day (BID) | INTRAMUSCULAR | Status: DC
  Administered 2020-11-04 (×2): 125 mg via INTRAVENOUS
  Filled 2020-11-03 (×2): qty 2

## 2020-11-03 MED ORDER — ACETAMINOPHEN 325 MG PO TABS: 650.0000 mg | ORAL_TABLET | Freq: Four times a day (QID) | ORAL | Status: DC | PRN

## 2020-11-03 MED ORDER — SODIUM CHLORIDE BACTERIOSTATIC 0.9 % IJ SOLN
INTRAMUSCULAR | Status: AC
  Filled 2020-11-03: qty 10

## 2020-11-03 MED ORDER — MIDAZOLAM HCL 5 MG/5ML IJ SOLN
INTRAMUSCULAR | Status: DC | PRN
  Administered 2020-11-03 (×2): 2 mg via INTRAVENOUS

## 2020-11-03 MED ORDER — LABETALOL HCL 5 MG/ML IV SOLN
10.0000 mg | Freq: Once | INTRAVENOUS | Status: AC
  Administered 2020-11-03: 10 mg via INTRAVENOUS
  Filled 2020-11-03: qty 4

## 2020-11-03 MED ORDER — SODIUM CHLORIDE 0.9 % IV SOLN
2.0000 g | INTRAVENOUS | Status: DC
  Filled 2020-11-03 (×2): qty 2

## 2020-11-03 SURGICAL SUPPLY — 59 items
APPLIER CLIP 11 MED OPEN (CLIP)
APPLIER CLIP 13 LRG OPEN (CLIP)
BARRIER SKIN 2 3/4 (OSTOMY) IMPLANT
BARRIER SKIN 2 3/4 INCH (OSTOMY)
BARRIER SKIN OD2.25 2 3/4 FLNG (OSTOMY) IMPLANT
CHLORAPREP W/TINT 26 (MISCELLANEOUS) ×3 IMPLANT
CLAMP POUCH DRAINAGE QUIET (OSTOMY) IMPLANT
CLIP APPLIE 11 MED OPEN (CLIP) IMPLANT
CLIP APPLIE 13 LRG OPEN (CLIP) IMPLANT
CLOTH BEACON ORANGE TIMEOUT ST (SAFETY) ×3 IMPLANT
COVER LIGHT HANDLE STERIS (MISCELLANEOUS) ×6 IMPLANT
COVER WAND RF STERILE (DRAPES) ×3 IMPLANT
DRAPE WARM FLUID 44X44 (DRAPES) ×3 IMPLANT
DRSG OPSITE POSTOP 4X10 (GAUZE/BANDAGES/DRESSINGS) ×2 IMPLANT
ELECT BLADE 6 FLAT ULTRCLN (ELECTRODE) IMPLANT
ELECT REM PT RETURN 9FT ADLT (ELECTROSURGICAL) ×3
ELECTRODE REM PT RTRN 9FT ADLT (ELECTROSURGICAL) ×1 IMPLANT
GLOVE SURG SS PI 7.5 STRL IVOR (GLOVE) ×3 IMPLANT
GLOVE SURG UNDER POLY LF SZ7 (GLOVE) ×6 IMPLANT
GOWN STRL REUS W/TWL LRG LVL3 (GOWN DISPOSABLE) ×9 IMPLANT
HANDLE SUCTION POOLE (INSTRUMENTS) IMPLANT
INST SET MAJOR GENERAL (KITS) ×3 IMPLANT
KIT TURNOVER KIT A (KITS) ×3 IMPLANT
LIGASURE IMPACT 36 18CM CVD LR (INSTRUMENTS) ×3 IMPLANT
MANIFOLD NEPTUNE II (INSTRUMENTS) ×3 IMPLANT
NDL HYPO 18GX1.5 BLUNT FILL (NEEDLE) ×1 IMPLANT
NDL HYPO 21X1.5 SAFETY (NEEDLE) ×1 IMPLANT
NEEDLE HYPO 18GX1.5 BLUNT FILL (NEEDLE) ×3 IMPLANT
NEEDLE HYPO 21X1.5 SAFETY (NEEDLE) ×3 IMPLANT
NS IRRIG 1000ML POUR BTL (IV SOLUTION) ×6 IMPLANT
PACK ABDOMINAL MAJOR (CUSTOM PROCEDURE TRAY) ×3 IMPLANT
PAD ARMBOARD 7.5X6 YLW CONV (MISCELLANEOUS) ×3 IMPLANT
PENCIL SMOKE EVACUATOR (MISCELLANEOUS) ×3 IMPLANT
POUCH OSTOMY 2 3/4  H 3804 (WOUND CARE)
POUCH OSTOMY 2 PC DRNBL 2.75 (WOUND CARE) IMPLANT
RELOAD LINEAR CUT PROX 55 BLUE (ENDOMECHANICALS) IMPLANT
RELOAD PROXIMATE 75MM BLUE (ENDOMECHANICALS) ×3 IMPLANT
RELOAD STAPLE 55 3.8 BLU REG (ENDOMECHANICALS) IMPLANT
RELOAD STAPLE 75 3.8 BLU REG (ENDOMECHANICALS) IMPLANT
RETRACTOR WND ALEXIS-O 25 LRG (MISCELLANEOUS) IMPLANT
RETRACTOR WOUND ALXS 18CM MED (MISCELLANEOUS) IMPLANT
RTRCTR WOUND ALEXIS O 18CM MED (MISCELLANEOUS)
RTRCTR WOUND ALEXIS O 25CM LRG (MISCELLANEOUS) ×3
SET BASIN LINEN APH (SET/KITS/TRAYS/PACK) ×3 IMPLANT
SPONGE LAP 18X18 RF (DISPOSABLE) ×3 IMPLANT
STAPLER GUN LINEAR PROX 60 (STAPLE) IMPLANT
STAPLER PROXIMATE 55 BLUE (STAPLE) IMPLANT
STAPLER PROXIMATE 75MM BLUE (STAPLE) ×4 IMPLANT
STAPLER VISISTAT (STAPLE) ×3 IMPLANT
SUCTION POOLE HANDLE (INSTRUMENTS) ×3
SUT CHROMIC 0 SH (SUTURE) IMPLANT
SUT CHROMIC 2 0 SH (SUTURE) ×2 IMPLANT
SUT CHROMIC 3 0 SH 27 (SUTURE) IMPLANT
SUT PDS AB 0 CTX 60 (SUTURE) ×4 IMPLANT
SUT SILK 2 0 (SUTURE)
SUT SILK 2-0 18XBRD TIE 12 (SUTURE) IMPLANT
SUT SILK 3 0 SH CR/8 (SUTURE) ×4 IMPLANT
SYR 20ML LL LF (SYRINGE) ×6 IMPLANT
TRAY FOLEY MTR SLVR 16FR STAT (SET/KITS/TRAYS/PACK) ×3 IMPLANT

## 2020-11-03 NOTE — ED Notes (Signed)
Surgical consent completed and at bedside

## 2020-11-03 NOTE — Interval H&P Note (Signed)
History and Physical Interval Note:  11/03/2020 8:31 PM  Kaitlin [name]  has presented today for surgery, with the diagnosis of small bowel obstruction.  The various methods of treatment have been discussed with the patient and family. After consideration of risks, benefits and other options for treatment, the patient has consented to  Procedure(s): EXPLORATORY LAPAROTOMY (N/A) as a surgical intervention.  The patient's history has been reviewed, patient examined, no change in status, stable for surgery.  I have reviewed the patient's chart and labs.  Questions were answered to the patient's satisfaction.     Aviva Signs

## 2020-11-03 NOTE — Transfer of Care (Signed)
Immediate Anesthesia Transfer of Care Note  Patient: Kaitlin [name]  Procedure(s) Performed: EXPLORATORY LAPAROTOMY BOWEL RESECTION  Patient Location: PACU and ICU  Anesthesia Type:General  Level of Consciousness: Patient remains intubated per anesthesia plan  Airway & Oxygen Therapy: Patient remains intubated per anesthesia plan  Post-op Assessment: Report given to RN and Post -op Vital signs reviewed and stable  Post vital signs: Reviewed and stable  Last Vitals:  Vitals Value Taken Time  BP 120/85 11/03/20 2315  Temp 35.7 C 11/03/20 2300  Pulse 87 11/03/20 2319  Resp 17 11/03/20 2319  SpO2 100 % 11/03/20 2319  Vitals shown include unvalidated device data.  Last Pain:  Vitals:   11/03/20 1849  TempSrc:   PainSc: 4          Complications: No notable events documented.

## 2020-11-03 NOTE — ED Provider Notes (Signed)
Emergency Medicine Provider Note  Kaitlin [name] , a 70 y.o. female  was evaluated in triage.  Pt complains of abdominal pain and vomiting.  Review of Systems  Positive: Abdominal pain, vomiting Negative: Diarrhea  Physical Exam  BP (!) 187/112   Pulse 96   Temp 97.6 F (36.4 C) (Oral)   Resp 20   SpO2 97%  Gen:   Awake, no distress uncomfortable. Resp:  Normal effort  MSK:   Moves extremities without difficulty  Other:  Abdominal exam with tenderness, symptomatic sounds to percussion, decreased bowel sounds Neurologic: Normal speech  Medical Decision Making  Medically screening exam initiated at 5:29 PM.  Appropriate orders placed.  Kaitlin [name] was informed that the remainder of the evaluation will be completed by another provider, this initial triage assessment does not replace that evaluation, and the importance of remaining in the ED until their evaluation is complete.  Complete evaluation by physician assistant Joy, patient has lab work which is reassuring however she has a CT scan showing a closed-loop bowel obstruction concerning for ischemic process, Dr. Arnoldo Morale consulted, patient is ill and will need to be admitted and likely undergo surgery   Noemi Chapel, MD 11/07/20 1457

## 2020-11-03 NOTE — ED Triage Notes (Signed)
Abdominal pain with vomiting

## 2020-11-03 NOTE — Anesthesia Preprocedure Evaluation (Addendum)
Anesthesia Evaluation  Patient identified by MRN, date of birth, ID band Patient awake    Reviewed: Allergy & Precautions, NPO status , Patient's Chart, lab work & pertinent test results  History of Anesthesia Complications Negative for: history of anesthetic complications  Airway Mallampati: III  TM Distance: >3 FB Neck ROM: Full    Dental  (+) Missing, Poor Dentition, Dental Advisory Given   Pulmonary asthma , pneumonia, neg COPD,  COPD inhaler, PE Lung cancer   Pulmonary exam normal breath sounds clear to auscultation       Cardiovascular Exercise Tolerance: Poor hypertension, Pt. on medications  Rhythm:Regular Rate:Tachycardia     Neuro/Psych Brain metastasis  Neuromuscular disease    GI/Hepatic GERD  Medicated,Small bowel obstruction/ischemic bowel   Endo/Other  diabetes, Well Controlled, Type 2, Oral Hypoglycemic Agents  Renal/GU      Musculoskeletal   Abdominal   Peds  Hematology negative hematology ROS (+)   Anesthesia Other Findings   Reproductive/Obstetrics negative OB ROS                             Anesthesia Physical Anesthesia Plan  ASA: 4 and emergent  Anesthesia Plan: General   Post-op Pain Management:    Induction: Intravenous  PONV Risk Score and Plan: 4 or greater and Ondansetron  Airway Management Planned: Oral ETT  Additional Equipment: Arterial line  Intra-op Plan:   Post-operative Plan: Post-operative intubation/ventilation and Possible Post-op intubation/ventilation  Informed Consent: I have reviewed the patients History and Physical, chart, labs and discussed the procedure including the risks, benefits and alternatives for the proposed anesthesia with the patient or authorized representative who has indicated his/her understanding and acceptance.     Dental advisory given  Plan Discussed with: Surgeon  Anesthesia Plan Comments:         Anesthesia Quick Evaluation

## 2020-11-03 NOTE — Progress Notes (Signed)
[facility] Progress Note Patient Name: CLELIA TRABUCCO DOB: 1950-06-12 MRN: [id number]   Date of Service  11/03/2020  HPI/Events of Note  Patient transferred to the ICU intubated and vented s/p exploratory laparotomy for resection of ischemic small bowel involved in SBO.  eICU Interventions  New Patient Evaluation.        Kerry Kass Shaniqua Guillot 11/03/2020, 11:35 PM

## 2020-11-03 NOTE — ED Notes (Signed)
anaesthesia came to pt bedside and spoke with pt- says the OR room is almost ready and someone will be back shortly to get pt.

## 2020-11-03 NOTE — H&P (View-Only) (Signed)
Reason for Consult: Closed-loop obstruction Referring Physician: Emergency room, Dr. Noemi Chapel  Kaitlin [name] is an 70 y.o. female.  HPI: Patient is a 70 year old black female with a history of metastatic lung carcinoma to the brain who presents with new onset lower abdominal pain, nausea, and vomiting.  It started early this morning.  She presented to the emergency room due to the persistence of the symptoms.  CT scan of the abdomen pelvis reveals a closed-loop obstruction of small bowel in the pelvis with edema and bowel wall thickening, concerning for impending ischemia.  Patient has never had this before.  She has been followed by oncology for her metastatic lung carcinoma to the brain.  She finished radiation therapy to the brain in March 2022.  She does take steroids daily.  She is also on Lovenox injections, the last 1 given yesterday.  She has not taken any of her medications today.  Past Medical History:  Diagnosis Date   Asthma    Cancer ([place]) 04/21/2018   Lung Cancer    Carpal tunnel syndrome    Cataract    left eye   Diabetes mellitus without complication ([place])    DVT (deep venous thrombosis) ([place])    Hypertension    Pneumonia 04/21/2018   Pulmonary embolism ([place]) 03/2018   Sepsis ([place])     Past Surgical History:  Procedure Laterality Date   ABDOMINAL HYSTERECTOMY     biopsy of right breast     ESOPHAGOGASTRODUODENOSCOPY (EGD) WITH PROPOFOL N/A 09/12/2018   Procedure: ESOPHAGOGASTRODUODENOSCOPY (EGD) WITH PROPOFOL (scope in 1017, scope out 1032);  Surgeon: Virl Cagey, MD;  Location: [facility];  Service: General;  Laterality: N/A;   FOOT SURGERY     implant left eye     INTRAOCULAR LENS INSERTION     IR GASTROSTOMY TUBE MOD SED  12/05/2018   PEG PLACEMENT N/A 09/12/2018   Procedure: PERCUTANEOUS ENDOSCOPIC GASTROSTOMY (PEG) PLACEMENT;  Surgeon: Virl Cagey, MD;  Location: [facility];  Service: General;  Laterality: N/A;    Family History  Problem Relation Age of  Onset   Heart failure Mother    Stroke Mother    Heart attack Mother    Kidney failure Other    Leukemia Father     Social History:  reports that she has never smoked. She has never used smokeless tobacco. She reports previous alcohol use. She reports that she does not use drugs.  Allergies:  Allergies  Allergen Reactions   Banana Anaphylaxis   Aspirin Other (See Comments)    G.I. Upset     Medications: I have reviewed the patient's current medications. Prior to Admission: (Not in a hospital admission)   Results for orders placed or performed during the hospital encounter of 11/03/20 (from the past 48 hour(s))  Urinalysis, Routine w reflex microscopic Urine, Clean Catch     Status: Abnormal   Collection Time: 11/03/20  2:13 PM  Result Value Ref Range   Color, Urine YELLOW YELLOW   APPearance HAZY (A) CLEAR   Specific Gravity, Urine 1.027 1.005 - 1.030   pH 5.0 5.0 - 8.0   Glucose, UA NEGATIVE NEGATIVE mg/dL   Hgb urine dipstick MODERATE (A) NEGATIVE   Bilirubin Urine NEGATIVE NEGATIVE   Ketones, ur 20 (A) NEGATIVE mg/dL   Protein, ur 100 (A) NEGATIVE mg/dL   Nitrite NEGATIVE NEGATIVE   Leukocytes,Ua LARGE (A) NEGATIVE   RBC / HPF 21-50 0 - 5 RBC/hpf   WBC, UA >50 (H)  0 - 5 WBC/hpf   Bacteria, UA RARE (A) NONE SEEN   Squamous Epithelial / LPF 0-5 0 - 5   Mucus PRESENT     Comment: Performed at [facility], [address] [place], [place] [address]  CBC with Differential     Status: None   Collection Time: 11/03/20  2:48 PM  Result Value Ref Range   WBC 9.4 4.0 - 10.5 K/uL   RBC 5.11 3.87 - 5.11 MIL/uL   Hemoglobin 14.6 12.0 - 15.0 g/dL   HCT 44.5 36.0 - 46.0 %   MCV 87.1 80.0 - 100.0 fL   MCH 28.6 26.0 - 34.0 pg   MCHC 32.8 30.0 - 36.0 g/dL   RDW 15.4 11.5 - 15.5 %   Platelets 234 150 - 400 K/uL   nRBC 0.0 0.0 - 0.2 %   Neutrophils Relative % 82 %   Neutro Abs 7.6 1.7 - 7.7 K/uL   Lymphocytes Relative 10 %   Lymphs Abs 1.0 0.7 - 4.0 K/uL   Monocytes Relative  7 %   Monocytes Absolute 0.7 0.1 - 1.0 K/uL   Eosinophils Relative 1 %   Eosinophils Absolute 0.1 0.0 - 0.5 K/uL   Basophils Relative 0 %   Basophils Absolute 0.0 0.0 - 0.1 K/uL   Immature Granulocytes 0 %   Abs Immature Granulocytes 0.03 0.00 - 0.07 K/uL    Comment: Performed at [facility], [address] [place], [place] [address]  Comprehensive metabolic panel     Status: Abnormal   Collection Time: 11/03/20  2:48 PM  Result Value Ref Range   Sodium 138 135 - 145 mmol/L   Potassium 3.9 3.5 - 5.1 mmol/L   Chloride 105 98 - 111 mmol/L   CO2 22 22 - 32 mmol/L   Glucose, Bld 154 (H) 70 - 99 mg/dL    Comment: Glucose reference range applies only to samples taken after fasting for at least 8 hours.   BUN 15 8 - 23 mg/dL   Creatinine, Ser 0.89 0.44 - 1.00 mg/dL   Calcium 10.2 8.9 - 10.3 mg/dL   Total Protein 7.6 6.5 - 8.1 g/dL   Albumin 4.4 3.5 - 5.0 g/dL   AST 18 15 - 41 U/L   ALT 13 0 - 44 U/L   Alkaline Phosphatase 58 38 - 126 U/L   Total Bilirubin 1.1 0.3 - 1.2 mg/dL   GFR, Estimated >60 >60 mL/min    Comment: (NOTE) Calculated using the CKD-EPI Creatinine Equation (2021)    Anion gap 11 5 - 15    Comment: Performed at [facility], [address] [place], [place] [address]  Lipase, blood     Status: None   Collection Time: 11/03/20  2:48 PM  Result Value Ref Range   Lipase 29 11 - 51 U/L    Comment: Performed at [facility], [address] [place], [place] [address]    CT Abdomen Pelvis W Contrast  Result Date: 11/03/2020 CLINICAL DATA:  Abdominal pain and vomiting for the past 2 days. EXAM: CT ABDOMEN AND PELVIS WITH CONTRAST TECHNIQUE: Multidetector CT imaging of the abdomen and pelvis was performed using the standard protocol following bolus administration of intravenous contrast. CONTRAST:  141mL OMNIPAQUE IOHEXOL 300 MG/ML  SOLN COMPARISON:  PET-CT dated July 07, 2020. FINDINGS: Lower chest: No acute abnormality. Bilateral perihilar and lower lobe post radiation  changes. Chronic circumferential wall thickening of both lower esophagus. Hepatobiliary: No focal liver abnormality is seen. No  gallstones, gallbladder wall thickening, or biliary dilatation. Pancreas: Unremarkable. No pancreatic ductal dilatation or surrounding inflammatory changes. Spleen: Normal in size without focal abnormality. Adrenals/Urinary Tract: Adrenal glands are unremarkable. Kidneys are normal, without renal calculi, focal lesion, or hydronephrosis. Bladder is unremarkable. Stomach/Bowel: The stomach is within normal limits. Closed loop small bowel obstruction in the right pelvis with the obstructed closed loop demonstrating circumferential small bowel wall thickening and poor enhancement, concerning for ischemia. No pneumatosis. Additional mildly dilated small bowel loops proximal to the closed loop obstruction without wall thickening. The colon is unremarkable. Normal appendix. Vascular/Lymphatic: Aortic atherosclerosis. No enlarged abdominal or pelvic lymph nodes. Reproductive: Status post hysterectomy. No adnexal masses. Other: Small reactive ascites in the pelvis.  No pneumoperitoneum. Musculoskeletal: No acute or significant osseous findings. IMPRESSION: 1. Closed loop small bowel obstruction in the right pelvis with the obstructed closed loop demonstrating circumferential small bowel wall thickening and poor enhancement, consistent with ischemia. No pneumatosis or portal venous gas. No perforation. 2. Additional small bowel obstruction proximal to the closed loop without signs of ischemia. 3. Aortic Atherosclerosis (ICD10-I70.0). Critical Value/emergent results were called by telephone at the time of interpretation on 11/03/2020 at 4:59 pm to provider [facility] , who verbally acknowledged these results. Electronically Signed   By: Titus Dubin M.D.   On: 11/03/2020 16:59   DG Chest Portable 1 View  Result Date: 11/03/2020 CLINICAL DATA:  Preoperative evaluation. EXAM: PORTABLE CHEST 1 VIEW  COMPARISON:  Oct 06, 2020 FINDINGS: Decreased lung volumes are seen. Mild, stable areas of linear scarring and/or atelectasis are noted within the bilateral lung bases and bilateral suprahilar regions. There is no evidence of acute infiltrate, pleural effusion or pneumothorax. The heart size and mediastinal contours are within normal limits. The visualized skeletal structures are unremarkable. IMPRESSION: Low lung volumes with stable areas of linear scarring and/or atelectasis. Electronically Signed   By: Virgina Norfolk M.D.   On: 11/03/2020 18:12    ROS:  Pertinent items are noted in HPI.  Blood pressure (!) 187/112, pulse 96, temperature 97.6 F (36.4 C), temperature source Oral, resp. rate 20, SpO2 97 %. Physical Exam: Pleasant black female no acute distress Lungs are clear to auscultation with good breath sounds bilaterally Heart examination reveals regular rate and rhythm without S3, S4, murmurs Abdomen is soft with tenderness noted in this lower abdominal regions.  No rigidity is noted.  CT scan images personally reviewed  Assessment/Plan: Impression: Closed-loop obstruction of small intestine most likely secondary to adhesive disease from previous pelvic surgery.  Patient is actively undergoing treatment for lung carcinoma with metastatic disease to the brain which appears to be stable.  She is on anticoagulation for history of pulmonary embolus, though her last dose was yesterday.  She is also on steroids daily. Plan: Patient needs emergent surgical exploration with possible partial small bowel resection.  The risks and benefits of the procedure including bleeding, infection, cardiopulmonary difficulties, the possible need for remaining intubated at the end of the surgery, and the possibility of a bowel resection were fully explained to the patient, who gave informed consent.  Solu-Medrol has been ordered.  Aviva Signs 11/03/2020, 6:37 PM

## 2020-11-03 NOTE — Op Note (Signed)
Patient:  Kaitlin [name]  DOB:  1950/09/08  MRN:  [id number]   Preop Diagnosis: Closed-loop small bowel obstruction  Postop Diagnosis: Same, ischemic necrosis of small bowel  Procedure: Exploratory laparotomy, partial small bowel resection  Surgeon: Aviva Signs, MD  Anes: General endotracheal  Indications: Patient is a 70 year old black female with multiple medical problems who presented to the emergency room with worsening lower abdominal pain, nausea, and vomiting.  CT scan of the abdomen revealed a probable closed-loop obstruction with evidence of ischemia of the bowel.  The risks and benefits of the procedure including bleeding, infection, cardiopulmonary difficulties, the possibility of staying intubated after the surgery, and the possibility of a bowel resection were fully explained to the patient, who gave informed consent.  Procedure note: The patient was placed in the supine position.  After induction of general endotracheal anesthesia, the abdomen was prepped and draped using the usual sterile technique with ChloraPrep.  Surgical site confirmation was performed.  Midline incision was made from above the umbilicus to the right of the umbilicus.  The peritoneal cavity was entered into without difficulty.  Some bloody serosanguineous fluid was found in the pelvis.  The small bowel was then exteriorized.  There was a single adhesive band across the mesentery of the distal small bowel.  This was released.  This was proximal to the terminal ileum.  I then waited as the bowel was freed from the adhesion to guesstimate the amount of small bowel that had to be resected.  There was obvious full-thickness necrosis of a large portion of the distal small bowel.  A GIA stapler was placed proximally distally around the affected area.  This was approximately 100 cm of small bowel.  A LigaSure was used to divide the mesentery.  I then inspected the ends of the remaining small bowel and good peristalsis  and blood flow was seen.  A side to side GIA-75 anastomosis was performed.  The enterotomy was closed using a TA 60 stapler.  The staple line was bolstered using 3-0 silk sutures.  The mesenteric defect was then closed using a 2-0 Chromic Gut running suture.  The bowel was then returned into the abdominal cavity in an orderly fashion.  The NG tube was noted to be in appropriate position in the stomach.  This was done after the bowel was run from the ligament of Treitz to the terminal ileum.  No other necrotic areas were noted.  There was never any spillage of bowel contents.  The fascia was reapproximated using a looped 0 PDS running suture.  The subcutaneous layer was irrigated with normal saline.  Exparel was instilled into the surrounding wound.  The skin was closed using staples.  Betadine ointment and dry sterile dressing were applied.  All tape and needle counts were correct at the end of the procedure.  The patient was left intubated and transferred to the ICU in guarded but stable condition.  Complications: None  EBL: 50 cc  Specimen: Small bowel

## 2020-11-03 NOTE — Anesthesia Postprocedure Evaluation (Addendum)
Anesthesia Post Note  Patient: Kaitlin [name]  Procedure(s) Performed: EXPLORATORY LAPAROTOMY BOWEL RESECTION  Patient location during evaluation: ICU Anesthesia Type: General Level of consciousness: patient remains intubated per anesthesia plan Pain management: pain level controlled Vital Signs Assessment: post-procedure vital signs reviewed and stable Respiratory status: patient remains intubated per anesthesia plan Cardiovascular status: stable and blood pressure returned to baseline Postop Assessment: no apparent nausea or vomiting Anesthetic complications: no Comments: Patient was transported via continuous monitor, vital signs stable during transportation. Temp 97.5 at 23:35   No notable events documented.   Last Vitals:  Vitals:   11/03/20 2300 11/03/20 2303  BP:  128/89  Pulse:  99  Resp:  16  Temp: (!) 35.7 C   SpO2:  100%    Last Pain:  Vitals:   11/03/20 1849  TempSrc:   PainSc: 4                  Leafy Motsinger C Corie Vavra

## 2020-11-03 NOTE — Consult Note (Signed)
Reason for Consult: Closed-loop obstruction Referring Physician: Emergency room, Dr. Noemi Chapel  Kaitlin [name] is an 70 y.o. female.  HPI: Patient is a 70 year old black female with a history of metastatic lung carcinoma to the brain who presents with new onset lower abdominal pain, nausea, and vomiting.  It started early this morning.  She presented to the emergency room due to the persistence of the symptoms.  CT scan of the abdomen pelvis reveals a closed-loop obstruction of small bowel in the pelvis with edema and bowel wall thickening, concerning for impending ischemia.  Patient has never had this before.  She has been followed by oncology for her metastatic lung carcinoma to the brain.  She finished radiation therapy to the brain in March 2022.  She does take steroids daily.  She is also on Lovenox injections, the last 1 given yesterday.  She has not taken any of her medications today.  Past Medical History:  Diagnosis Date   Asthma    Cancer ([place]) 04/21/2018   Lung Cancer    Carpal tunnel syndrome    Cataract    left eye   Diabetes mellitus without complication ([place])    DVT (deep venous thrombosis) ([place])    Hypertension    Pneumonia 04/21/2018   Pulmonary embolism ([place]) 03/2018   Sepsis ([place])     Past Surgical History:  Procedure Laterality Date   ABDOMINAL HYSTERECTOMY     biopsy of right breast     ESOPHAGOGASTRODUODENOSCOPY (EGD) WITH PROPOFOL N/A 09/12/2018   Procedure: ESOPHAGOGASTRODUODENOSCOPY (EGD) WITH PROPOFOL (scope in 1017, scope out 1032);  Surgeon: Virl Cagey, MD;  Location: [facility];  Service: General;  Laterality: N/A;   FOOT SURGERY     implant left eye     INTRAOCULAR LENS INSERTION     IR GASTROSTOMY TUBE MOD SED  12/05/2018   PEG PLACEMENT N/A 09/12/2018   Procedure: PERCUTANEOUS ENDOSCOPIC GASTROSTOMY (PEG) PLACEMENT;  Surgeon: Virl Cagey, MD;  Location: [facility];  Service: General;  Laterality: N/A;    Family History  Problem Relation Age of  Onset   Heart failure Mother    Stroke Mother    Heart attack Mother    Kidney failure Other    Leukemia Father     Social History:  reports that she has never smoked. She has never used smokeless tobacco. She reports previous alcohol use. She reports that she does not use drugs.  Allergies:  Allergies  Allergen Reactions   Banana Anaphylaxis   Aspirin Other (See Comments)    G.I. Upset     Medications: I have reviewed the patient's current medications. Prior to Admission: (Not in a hospital admission)   Results for orders placed or performed during the hospital encounter of 11/03/20 (from the past 48 hour(s))  Urinalysis, Routine w reflex microscopic Urine, Clean Catch     Status: Abnormal   Collection Time: 11/03/20  2:13 PM  Result Value Ref Range   Color, Urine YELLOW YELLOW   APPearance HAZY (A) CLEAR   Specific Gravity, Urine 1.027 1.005 - 1.030   pH 5.0 5.0 - 8.0   Glucose, UA NEGATIVE NEGATIVE mg/dL   Hgb urine dipstick MODERATE (A) NEGATIVE   Bilirubin Urine NEGATIVE NEGATIVE   Ketones, ur 20 (A) NEGATIVE mg/dL   Protein, ur 100 (A) NEGATIVE mg/dL   Nitrite NEGATIVE NEGATIVE   Leukocytes,Ua LARGE (A) NEGATIVE   RBC / HPF 21-50 0 - 5 RBC/hpf   WBC, UA >50 (H)  0 - 5 WBC/hpf   Bacteria, UA RARE (A) NONE SEEN   Squamous Epithelial / LPF 0-5 0 - 5   Mucus PRESENT     Comment: Performed at [facility], [address], [place], [place] [address]  CBC with Differential     Status: None   Collection Time: 11/03/20  2:48 PM  Result Value Ref Range   WBC 9.4 4.0 - 10.5 K/uL   RBC 5.11 3.87 - 5.11 MIL/uL   Hemoglobin 14.6 12.0 - 15.0 g/dL   HCT 44.5 36.0 - 46.0 %   MCV 87.1 80.0 - 100.0 fL   MCH 28.6 26.0 - 34.0 pg   MCHC 32.8 30.0 - 36.0 g/dL   RDW 15.4 11.5 - 15.5 %   Platelets 234 150 - 400 K/uL   nRBC 0.0 0.0 - 0.2 %   Neutrophils Relative % 82 %   Neutro Abs 7.6 1.7 - 7.7 K/uL   Lymphocytes Relative 10 %   Lymphs Abs 1.0 0.7 - 4.0 K/uL   Monocytes Relative  7 %   Monocytes Absolute 0.7 0.1 - 1.0 K/uL   Eosinophils Relative 1 %   Eosinophils Absolute 0.1 0.0 - 0.5 K/uL   Basophils Relative 0 %   Basophils Absolute 0.0 0.0 - 0.1 K/uL   Immature Granulocytes 0 %   Abs Immature Granulocytes 0.03 0.00 - 0.07 K/uL    Comment: Performed at [facility], [address], [place], [place] [address]  Comprehensive metabolic panel     Status: Abnormal   Collection Time: 11/03/20  2:48 PM  Result Value Ref Range   Sodium 138 135 - 145 mmol/L   Potassium 3.9 3.5 - 5.1 mmol/L   Chloride 105 98 - 111 mmol/L   CO2 22 22 - 32 mmol/L   Glucose, Bld 154 (H) 70 - 99 mg/dL    Comment: Glucose reference range applies only to samples taken after fasting for at least 8 hours.   BUN 15 8 - 23 mg/dL   Creatinine, Ser 0.89 0.44 - 1.00 mg/dL   Calcium 10.2 8.9 - 10.3 mg/dL   Total Protein 7.6 6.5 - 8.1 g/dL   Albumin 4.4 3.5 - 5.0 g/dL   AST 18 15 - 41 U/L   ALT 13 0 - 44 U/L   Alkaline Phosphatase 58 38 - 126 U/L   Total Bilirubin 1.1 0.3 - 1.2 mg/dL   GFR, Estimated >60 >60 mL/min    Comment: (NOTE) Calculated using the CKD-EPI Creatinine Equation (2021)    Anion gap 11 5 - 15    Comment: Performed at [facility], [address], [place], [place] [address]  Lipase, blood     Status: None   Collection Time: 11/03/20  2:48 PM  Result Value Ref Range   Lipase 29 11 - 51 U/L    Comment: Performed at [facility], [address] [place], [place] [address]    CT Abdomen Pelvis W Contrast  Result Date: 11/03/2020 CLINICAL DATA:  Abdominal pain and vomiting for the past 2 days. EXAM: CT ABDOMEN AND PELVIS WITH CONTRAST TECHNIQUE: Multidetector CT imaging of the abdomen and pelvis was performed using the standard protocol following bolus administration of intravenous contrast. CONTRAST:  124mL OMNIPAQUE IOHEXOL 300 MG/ML  SOLN COMPARISON:  PET-CT dated July 07, 2020. FINDINGS: Lower chest: No acute abnormality. Bilateral perihilar and lower lobe post radiation  changes. Chronic circumferential wall thickening of both lower esophagus. Hepatobiliary: No focal liver abnormality is seen. No  gallstones, gallbladder wall thickening, or biliary dilatation. Pancreas: Unremarkable. No pancreatic ductal dilatation or surrounding inflammatory changes. Spleen: Normal in size without focal abnormality. Adrenals/Urinary Tract: Adrenal glands are unremarkable. Kidneys are normal, without renal calculi, focal lesion, or hydronephrosis. Bladder is unremarkable. Stomach/Bowel: The stomach is within normal limits. Closed loop small bowel obstruction in the right pelvis with the obstructed closed loop demonstrating circumferential small bowel wall thickening and poor enhancement, concerning for ischemia. No pneumatosis. Additional mildly dilated small bowel loops proximal to the closed loop obstruction without wall thickening. The colon is unremarkable. Normal appendix. Vascular/Lymphatic: Aortic atherosclerosis. No enlarged abdominal or pelvic lymph nodes. Reproductive: Status post hysterectomy. No adnexal masses. Other: Small reactive ascites in the pelvis.  No pneumoperitoneum. Musculoskeletal: No acute or significant osseous findings. IMPRESSION: 1. Closed loop small bowel obstruction in the right pelvis with the obstructed closed loop demonstrating circumferential small bowel wall thickening and poor enhancement, consistent with ischemia. No pneumatosis or portal venous gas. No perforation. 2. Additional small bowel obstruction proximal to the closed loop without signs of ischemia. 3. Aortic Atherosclerosis (ICD10-I70.0). Critical Value/emergent results were called by telephone at the time of interpretation on 11/03/2020 at 4:59 pm to provider [facility] , who verbally acknowledged these results. Electronically Signed   By: Titus Dubin M.D.   On: 11/03/2020 16:59   DG Chest Portable 1 View  Result Date: 11/03/2020 CLINICAL DATA:  Preoperative evaluation. EXAM: PORTABLE CHEST 1 VIEW  COMPARISON:  Oct 06, 2020 FINDINGS: Decreased lung volumes are seen. Mild, stable areas of linear scarring and/or atelectasis are noted within the bilateral lung bases and bilateral suprahilar regions. There is no evidence of acute infiltrate, pleural effusion or pneumothorax. The heart size and mediastinal contours are within normal limits. The visualized skeletal structures are unremarkable. IMPRESSION: Low lung volumes with stable areas of linear scarring and/or atelectasis. Electronically Signed   By: Virgina Norfolk M.D.   On: 11/03/2020 18:12    ROS:  Pertinent items are noted in HPI.  Blood pressure (!) 187/112, pulse 96, temperature 97.6 F (36.4 C), temperature source Oral, resp. rate 20, SpO2 97 %. Physical Exam: Pleasant black female no acute distress Lungs are clear to auscultation with good breath sounds bilaterally Heart examination reveals regular rate and rhythm without S3, S4, murmurs Abdomen is soft with tenderness noted in this lower abdominal regions.  No rigidity is noted.  CT scan images personally reviewed  Assessment/Plan: Impression: Closed-loop obstruction of small intestine most likely secondary to adhesive disease from previous pelvic surgery.  Patient is actively undergoing treatment for lung carcinoma with metastatic disease to the brain which appears to be stable.  She is on anticoagulation for history of pulmonary embolus, though her last dose was yesterday.  She is also on steroids daily. Plan: Patient needs emergent surgical exploration with possible partial small bowel resection.  The risks and benefits of the procedure including bleeding, infection, cardiopulmonary difficulties, the possible need for remaining intubated at the end of the surgery, and the possibility of a bowel resection were fully explained to the patient, who gave informed consent.  Solu-Medrol has been ordered.  Aviva Signs 11/03/2020, 6:37 PM

## 2020-11-03 NOTE — ED Provider Notes (Signed)
[facility] EMERGENCY DEPARTMENT Provider Note   CSN: [id number] Arrival date & time: 11/03/20  1221     History Chief Complaint  Patient presents with   Abdominal Pain    Kaitlin [name] is a 70 y.o. female.  The history is provided by the patient.  Abdominal Pain Pain location:  Periumbilical and LLQ Pain quality: cramping   Pain radiates to:  RLQ Pain severity:  Severe Onset quality:  Gradual Duration: Starting around 3:45 AM. Timing:  Constant Progression:  Unchanged Chronicity:  New Relieved by:  Nothing Worsened by:  Movement and palpation Ineffective treatments:  None tried Associated symptoms: constipation, nausea and vomiting   Associated symptoms: no chest pain, no chills, no cough, no diarrhea, no dysuria, no fever, no flatus, no hematuria and no shortness of breath   Nausea:    Timing:  Constant   Progression:  Unchanged Risk factors: being elderly       Kaitlin [name] is a 70 y.o. female, with a history of lung cancer, asthma, DVT/PE, DM, HTN, presenting to the ED with abdominal pain beginning around 3:45 AM this morning. Pain is accompanied by nausea and multiple episodes of nonbloody nonbilious emesis. She states the pain is constant since onset, located in the left abdomen and periumbilical, radiating throughout the abdomen, cramping, severe.  Her last bowel movement was a few days ago.  States she has not been able to pass flatus either.  Her last dose of Lovenox and other evening medications, including narcotic analgesics, was last night. Her last oral intake was last night as well. Denies fever/chills, diarrhea, hematochezia/melena, hematuria, dysuria, difficulty urinating, new back pain, chest pain, shortness of breath, dizziness, syncope, or any other complaints.   Past Medical History:  Diagnosis Date   Asthma    Cancer ([place]) 04/21/2018   Lung Cancer    Carpal tunnel syndrome    Cataract    left eye   Diabetes mellitus without complication  (HCC)    DVT (deep venous thrombosis) ([place])    Hypertension    Pneumonia 04/21/2018   Pulmonary embolism ([place]) 03/2018   Sepsis [facility])     Patient Active Problem List   Diagnosis Date Noted   Small bowel obstruction ([place])    Prolapsed internal hemorrhoids, grade 3 04/18/2019   Anal polyp 04/18/2019   Malnutrition of moderate degree 12/03/2018   PEG tube malfunction ([place]) 12/02/2018   History of pulmonary embolism 12/02/2018   Protein-calorie malnutrition, severe 09/12/2018   Metastatic lung cancer (metastasis from lung to other site), left [facility])    Nausea and vomiting 09/11/2018   Weight loss 09/11/2018   Hypokalemia 09/09/2018   Brain metastases ([place]) 07/12/2018   Malignant pericardial effusion ([place]) 07/08/2018   HCAP (healthcare-associated pneumonia) 07/06/2018   Adenocarcinoma of lung, right ([place]) 07/06/2018   Adenocarcinoma, lung, left ([place]) 07/03/2018   Asthma 06/16/2018   Hypertension 06/16/2018   Diabetes mellitus without complication ([place]) 93/26/7124   DVT (deep venous thrombosis) ([place]) 06/16/2018   Pulmonary embolism during treatment with long-term anticoagulation therapy ([place]) 06/16/2018   Lung mass    Pulmonary emboli ([place]) 04/24/2018   Routine screening for STI (sexually transmitted infection) 03/14/2016    Past Surgical History:  Procedure Laterality Date   ABDOMINAL HYSTERECTOMY     biopsy of right breast     ESOPHAGOGASTRODUODENOSCOPY (EGD) WITH PROPOFOL N/A 09/12/2018   Procedure: ESOPHAGOGASTRODUODENOSCOPY (EGD) WITH PROPOFOL (scope in 1017, scope out 1032);  Surgeon: Virl Cagey, MD;  Location: [facility]  ORS;  Service: General;  Laterality: N/A;   FOOT SURGERY     implant left eye     INTRAOCULAR LENS INSERTION     IR GASTROSTOMY TUBE MOD SED  12/05/2018   PEG PLACEMENT N/A 09/12/2018   Procedure: PERCUTANEOUS ENDOSCOPIC GASTROSTOMY (PEG) PLACEMENT;  Surgeon: Virl Cagey, MD;  Location: [facility] ORS;  Service: General;  Laterality: N/A;     OB History      Gravida  4   Para  4   Term  4   Preterm      AB      Living         SAB      IAB      Ectopic      Multiple      Live Births              Family History  Problem Relation Age of Onset   Heart failure Mother    Stroke Mother    Heart attack Mother    Kidney failure Other    Leukemia Father     Social History   Tobacco Use   Smoking status: Never   Smokeless tobacco: Never  Vaping Use   Vaping Use: Never used  Substance Use Topics   Alcohol use: Not Currently    Comment: occ   Drug use: No    Home Medications Prior to Admission medications   Medication Sig Start Date End Date Taking? Authorizing Provider  albuterol (PROVENTIL HFA;VENTOLIN HFA) 108 (90 BASE) MCG/ACT inhaler Inhale 2 puffs into the lungs every 6 (six) hours as needed for wheezing.   Yes [provider]  amLODipine (NORVASC) 10 MG tablet Take 10 mg by mouth daily. 05/13/20  Yes [provider]  atorvastatin (LIPITOR) 20 MG tablet Take 1 tablet by mouth daily. 09/26/20  Yes [provider]  Colchicine (MITIGARE) 0.6 MG CAPS Take 0.6 mg by mouth daily as needed. Patient taking differently: Take 0.6 mg by mouth daily as needed (gout). 12/04/19  Yes Derek Jack, MD  enoxaparin (LOVENOX) 150 MG/ML injection Inject 1 mL (150 mg total) into the skin daily. 12/06/18  Yes Johnson, Clanford L, MD  hydrocortisone (ANUSOL-HC) 2.5 % rectal cream Place 1 application rectally daily as needed. 11/19/18  Yes [provider]  latanoprost (XALATAN) 0.005 % ophthalmic solution Place 1 drop into both eyes in the morning and at bedtime.  01/14/20  Yes [provider]  megestrol (MEGACE) 400 MG/10ML suspension Take 10 mLs (400 mg total) by mouth 2 (two) times daily. 10/13/20  Yes Derek Jack, MD  osimertinib mesylate (TAGRISSO) 40 MG tablet Take 3 tablets (120 mg total) by mouth daily. 10/13/20  Yes Derek Jack, MD  pantoprazole (PROTONIX) 40 MG  tablet TAKE 1 TABLET BY MOUTH ONCE DAILY. Patient taking differently: Take 40 mg by mouth daily. 03/25/19  Yes Lockamy, Randi L, NP-C  potassium chloride (KLOR-CON) 10 MEQ tablet TAKE (2) TABLETS BY MOUTH TWICE DAILY. Patient taking differently: Take 20 mEq by mouth 2 (two) times daily. Patient states that she is taking 187meq 04/14/20  Yes Derek Jack, MD  predniSONE (DELTASONE) 50 MG tablet Take one tablet daily for 5 days Patient taking differently: Take 50 mg by mouth daily with breakfast. Take one tablet daily for 5 days 10/07/20  Yes Idol, Almyra Free, PA-C  zolpidem (AMBIEN CR) 6.25 MG CR tablet Take 1 tablet (6.25 mg total) by mouth at bedtime as needed for sleep. 10/16/18  Yes  Derek Jack, MD    Allergies    Banana and Aspirin  Review of Systems   Review of Systems  Constitutional:  Negative for chills and fever.  Respiratory:  Negative for cough and shortness of breath.   Cardiovascular:  Negative for chest pain.  Gastrointestinal:  Positive for abdominal pain, constipation, nausea and vomiting. Negative for diarrhea and flatus.  Genitourinary:  Negative for dysuria and hematuria.  Musculoskeletal:  Negative for back pain.  Neurological:  Negative for dizziness, syncope and weakness.   Physical Exam Updated Vital Signs BP (!) 189/113   Pulse 67   Temp 97.7 F (36.5 C)   Resp 20   SpO2 100%   Physical Exam Vitals and nursing note reviewed.  Constitutional:      General: She is not in acute distress.    Appearance: She is well-developed. She is not diaphoretic.  HENT:     Head: Normocephalic and atraumatic.     Mouth/Throat:     Mouth: Mucous membranes are moist.     Pharynx: Oropharynx is clear.  Eyes:     Conjunctiva/sclera: Conjunctivae normal.  Cardiovascular:     Rate and Rhythm: Normal rate and regular rhythm.     Pulses: Normal pulses.          Radial pulses are 2+ on the right side and 2+ on the left side.       Posterior tibial pulses are 2+  on the right side and 2+ on the left side.     Heart sounds: Normal heart sounds.     Comments: Tactile temperature in the extremities appropriate and equal bilaterally. Pulmonary:     Effort: Pulmonary effort is normal. No respiratory distress.     Breath sounds: Normal breath sounds.  Abdominal:     Palpations: Abdomen is soft.     Tenderness: There is abdominal tenderness. There is no right CVA tenderness, left CVA tenderness or guarding.       Comments: All low patient has tenderness and pain in the periumbilical region as well as the lower abdomen, it seems to be worst in the central abdomen.  Musculoskeletal:     Cervical back: Neck supple.     Right lower leg: No edema.     Left lower leg: No edema.  Skin:    General: Skin is warm and dry.  Neurological:     Mental Status: She is alert.     Comments: No noted acute cognitive deficit. Sensation grossly intact to light touch in the extremities.   Grip strengths equal bilaterally.   Strength 5/5 in all extremities.  No gait disturbance.  Coordination intact.  Cranial nerves III-XII grossly intact.  Handles oral secretions without noted difficulty.  No noted phonation or speech deficit. No facial droop.   Psychiatric:        Mood and Affect: Mood and affect normal.        Speech: Speech normal.        Behavior: Behavior normal.    ED Results / Procedures / Treatments   Labs (all labs ordered are listed, but only abnormal results are displayed) Labs Reviewed  COMPREHENSIVE METABOLIC PANEL - Abnormal; Notable for the following components:      Result Value   Glucose, Bld 154 (*)    All other components within normal limits  URINALYSIS, ROUTINE W REFLEX MICROSCOPIC - Abnormal; Notable for the following components:   APPearance HAZY (*)    Hgb urine dipstick MODERATE (*)  Ketones, ur 20 (*)    Protein, ur 100 (*)    Leukocytes,Ua LARGE (*)    WBC, UA >50 (*)    Bacteria, UA RARE (*)    All other components within  normal limits  RESP PANEL BY RT-PCR (FLU A&B, COVID) ARPGX2  URINE CULTURE  CBC WITH DIFFERENTIAL/PLATELET  LIPASE, BLOOD    EKG None  Radiology CT Abdomen Pelvis W Contrast  Result Date: 11/03/2020 CLINICAL DATA:  Abdominal pain and vomiting for the past 2 days. EXAM: CT ABDOMEN AND PELVIS WITH CONTRAST TECHNIQUE: Multidetector CT imaging of the abdomen and pelvis was performed using the standard protocol following bolus administration of intravenous contrast. CONTRAST:  156mL OMNIPAQUE IOHEXOL 300 MG/ML  SOLN COMPARISON:  PET-CT dated July 07, 2020. FINDINGS: Lower chest: No acute abnormality. Bilateral perihilar and lower lobe post radiation changes. Chronic circumferential wall thickening of both lower esophagus. Hepatobiliary: No focal liver abnormality is seen. No gallstones, gallbladder wall thickening, or biliary dilatation. Pancreas: Unremarkable. No pancreatic ductal dilatation or surrounding inflammatory changes. Spleen: Normal in size without focal abnormality. Adrenals/Urinary Tract: Adrenal glands are unremarkable. Kidneys are normal, without renal calculi, focal lesion, or hydronephrosis. Bladder is unremarkable. Stomach/Bowel: The stomach is within normal limits. Closed loop small bowel obstruction in the right pelvis with the obstructed closed loop demonstrating circumferential small bowel wall thickening and poor enhancement, concerning for ischemia. No pneumatosis. Additional mildly dilated small bowel loops proximal to the closed loop obstruction without wall thickening. The colon is unremarkable. Normal appendix. Vascular/Lymphatic: Aortic atherosclerosis. No enlarged abdominal or pelvic lymph nodes. Reproductive: Status post hysterectomy. No adnexal masses. Other: Small reactive ascites in the pelvis.  No pneumoperitoneum. Musculoskeletal: No acute or significant osseous findings. IMPRESSION: 1. Closed loop small bowel obstruction in the right pelvis with the obstructed closed  loop demonstrating circumferential small bowel wall thickening and poor enhancement, consistent with ischemia. No pneumatosis or portal venous gas. No perforation. 2. Additional small bowel obstruction proximal to the closed loop without signs of ischemia. 3. Aortic Atherosclerosis (ICD10-I70.0). Critical Value/emergent results were called by telephone at the time of interpretation on 11/03/2020 at 4:59 pm to provider [facility] , who verbally acknowledged these results. Electronically Signed   By: Titus Dubin M.D.   On: 11/03/2020 16:59   DG Chest Portable 1 View  Result Date: 11/03/2020 CLINICAL DATA:  Preoperative evaluation. EXAM: PORTABLE CHEST 1 VIEW COMPARISON:  Oct 06, 2020 FINDINGS: Decreased lung volumes are seen. Mild, stable areas of linear scarring and/or atelectasis are noted within the bilateral lung bases and bilateral suprahilar regions. There is no evidence of acute infiltrate, pleural effusion or pneumothorax. The heart size and mediastinal contours are within normal limits. The visualized skeletal structures are unremarkable. IMPRESSION: Low lung volumes with stable areas of linear scarring and/or atelectasis. Electronically Signed   By: Virgina Norfolk M.D.   On: 11/03/2020 18:12    Procedures .Critical Care  Date/Time: 11/03/2020 5:11 PM Performed by: Lorayne Bender, PA-C Authorized by: Lorayne Bender, PA-C   Critical care provider statement:    Critical care time (minutes):  35   Critical care time was exclusive of:  Separately billable procedures and treating other patients   Critical care was necessary to treat or prevent imminent or life-threatening deterioration of the following conditions:  Circulatory failure   Critical care was time spent personally by me on the following activities:  Ordering and performing treatments and interventions, ordering and review of laboratory studies, ordering and review  of radiographic studies, pulse oximetry, re-evaluation of patient's  condition, review of old charts, obtaining history from patient or surrogate, development of treatment plan with patient or surrogate, discussions with consultants, evaluation of patient's response to treatment and examination of patient   I assumed direction of critical care for this patient from another provider in my specialty: no     Care discussed with: admitting provider     Medications Ordered in ED Medications  iohexol (OMNIPAQUE) 9 MG/ML oral solution (has no administration in time range)  0.9 %  sodium chloride infusion ( Intravenous New Bag/Given 11/03/20 1850)  ondansetron (ZOFRAN) injection 4 mg (4 mg Intravenous Given 11/03/20 1531)  morphine 4 MG/ML injection 4 mg (4 mg Intravenous Given 11/03/20 1538)  labetalol (NORMODYNE) injection 10 mg (10 mg Intravenous Given 11/03/20 1534)  sodium chloride 0.9 % bolus 1,000 mL (0 mLs Intravenous Stopped 11/03/20 1734)  iohexol (OMNIPAQUE) 300 MG/ML solution 100 mL (100 mLs Intravenous Contrast Given 11/03/20 1601)  morphine 4 MG/ML injection 4 mg (4 mg Intravenous Given 11/03/20 1742)  cefTRIAXone (ROCEPHIN) 1 g in sodium chloride 0.9 % 100 mL IVPB (0 g Intravenous Stopped 11/03/20 1827)  methylPREDNISolone sodium succinate (SOLU-MEDROL) 125 mg/2 mL injection 125 mg (125 mg Intravenous Given 11/03/20 1850)    ED Course  I have reviewed the triage vital signs and the nursing notes.  Pertinent labs & imaging results that were available during my care of the patient were reviewed by me and considered in my medical decision making (see chart for details).  Clinical Course as of 11/03/20 1917  Thu Nov 03, 2020  1420 BP(!): 189/113 Patient states she has not been able to take her medications due to pain, nausea, vomiting. [SJ]  1603 Urinalysis, Routine w reflex microscopic Urine, Clean Catch(!) [SJ]  4270 Spoke with Dr. Arnoldo Morale, general surgeon.  We discussed the patient's symptoms, CT findings, and overall presentation. He states he will come evaluate the  patient for surgery.  NG tube, preop EKG and portable chest x-ray. [SJ]    Clinical Course User Index [SJ] Taheerah Guldin, Helane Gunther, PA-C   MDM Rules/Calculators/A&P                          Patient presents with abdominal pain beginning early this morning. Patient is nontoxic appearing, afebrile, not tachycardic, not tachypneic, not hypotensive, maintains excellent SPO2 on room air.  I have reviewed the patient's chart to obtain more information.   I reviewed and interpreted the patient's labs and radiological studies. CT with evidence of closed-loop bowel obstruction and concern for ischemia. Patient admitted by general surgery for further management.   Findings and plan of care discussed with attending physician, Noemi Chapel, MD. Dr. Sabra Heck personally evaluated and examined this patient.  Final Clinical Impression(s) / ED Diagnoses Final diagnoses:  Small bowel obstruction [facility])    Rx / DC Orders ED Discharge Orders     None        Layla Maw 11/03/20 1918    Noemi Chapel, MD 11/07/20 1456

## 2020-11-04 ENCOUNTER — Encounter (HOSPITAL_COMMUNITY): Payer: Self-pay | Admitting: General Surgery

## 2020-11-04 DIAGNOSIS — E1169 Type 2 diabetes mellitus with other specified complication: Secondary | ICD-10-CM

## 2020-11-04 DIAGNOSIS — J9611 Chronic respiratory failure with hypoxia: Secondary | ICD-10-CM

## 2020-11-04 DIAGNOSIS — I10 Essential (primary) hypertension: Secondary | ICD-10-CM

## 2020-11-04 DIAGNOSIS — Z01818 Encounter for other preprocedural examination: Secondary | ICD-10-CM

## 2020-11-04 DIAGNOSIS — K219 Gastro-esophageal reflux disease without esophagitis: Secondary | ICD-10-CM

## 2020-11-04 DIAGNOSIS — J449 Chronic obstructive pulmonary disease, unspecified: Secondary | ICD-10-CM

## 2020-11-04 LAB — BASIC METABOLIC PANEL
Anion gap: 12 (ref 5–15)
BUN: 17 mg/dL (ref 8–23)
CO2: 20 mmol/L — ABNORMAL LOW (ref 22–32)
Calcium: 8.8 mg/dL — ABNORMAL LOW (ref 8.9–10.3)
Chloride: 106 mmol/L (ref 98–111)
Creatinine, Ser: 1 mg/dL (ref 0.44–1.00)
GFR, Estimated: >60 mL/min (ref >60)
Glucose, Bld: 213 mg/dL — ABNORMAL HIGH (ref 70–99)
Potassium: 5 mmol/L (ref 3.5–5.1)
Sodium: 138 mmol/L (ref 135–145)

## 2020-11-04 LAB — BLOOD GAS, ARTERIAL
Acid-base deficit: 5 mmol/L — ABNORMAL HIGH (ref 0.0–2.0)
Bicarbonate: 20.6 mmol/L (ref 20.0–28.0)
FIO2: 40
MECHVT: 490 mL
O2 Saturation: 98.2 %
PEEP: 5 cmH2O
Patient temperature: 36.4
RATE: 16 resp/min
pCO2 arterial: 33.7 mmHg (ref 32.0–48.0)
pH, Arterial: 7.376 (ref 7.350–7.450)
pO2, Arterial: 128 mmHg — ABNORMAL HIGH (ref 83.0–108.0)

## 2020-11-04 LAB — CBC
HCT: 44.2 % (ref 36.0–46.0)
Hemoglobin: 14.1 g/dL (ref 12.0–15.0)
MCH: 28.8 pg (ref 26.0–34.0)
MCHC: 31.9 g/dL (ref 30.0–36.0)
MCV: 90.4 fL (ref 80.0–100.0)
Platelets: 231 10*3/uL (ref 150–400)
RBC: 4.89 MIL/uL (ref 3.87–5.11)
RDW: 15.9 % — ABNORMAL HIGH (ref 11.5–15.5)
WBC: 22.8 10*3/uL — ABNORMAL HIGH (ref 4.0–10.5)
nRBC: 0 % (ref 0.0–0.2)

## 2020-11-04 LAB — GLUCOSE, CAPILLARY: Glucose-Capillary: 190 mg/dL — ABNORMAL HIGH (ref 70–99)

## 2020-11-04 LAB — MAGNESIUM: Magnesium: 1.8 mg/dL (ref 1.7–2.4)

## 2020-11-04 LAB — PHOSPHORUS: Phosphorus: 4.6 mg/dL (ref 2.5–4.6)

## 2020-11-04 LAB — MRSA PCR SCREENING: MRSA by PCR: NEGATIVE

## 2020-11-04 MED ORDER — INSULIN ASPART 100 UNIT/ML IJ SOLN: 0.0000 [IU] | Freq: Every day | INTRAMUSCULAR | Status: DC

## 2020-11-04 MED ORDER — HYDRALAZINE HCL 20 MG/ML IJ SOLN
15.0000 mg | INTRAMUSCULAR | Status: DC | PRN
  Administered 2020-11-05 – 2020-11-06 (×3): 15 mg via INTRAVENOUS
  Filled 2020-11-04 (×4): qty 1

## 2020-11-04 MED ORDER — FENTANYL CITRATE (PF) 100 MCG/2ML IJ SOLN
25.0000 ug | INTRAMUSCULAR | Status: DC | PRN
  Administered 2020-11-05: 25 ug via INTRAVENOUS
  Filled 2020-11-04: qty 2

## 2020-11-04 MED ORDER — IPRATROPIUM-ALBUTEROL 0.5-2.5 (3) MG/3ML IN SOLN: 3.0000 mL | Freq: Four times a day (QID) | RESPIRATORY_TRACT | Status: DC | PRN

## 2020-11-04 MED ORDER — FENTANYL CITRATE (PF) 100 MCG/2ML IJ SOLN
50.0000 ug | Freq: Once | INTRAMUSCULAR | Status: AC
  Administered 2020-11-05: 50 ug via INTRAVENOUS
  Filled 2020-11-04: qty 2

## 2020-11-04 MED ORDER — METOPROLOL TARTRATE 5 MG/5ML IV SOLN
5.0000 mg | Freq: Three times a day (TID) | INTRAVENOUS | Status: DC
  Administered 2020-11-04 – 2020-11-07 (×8): 5 mg via INTRAVENOUS
  Filled 2020-11-04 (×9): qty 5

## 2020-11-04 MED ORDER — METHYLPREDNISOLONE SODIUM SUCC 125 MG IJ SOLR
60.0000 mg | Freq: Two times a day (BID) | INTRAMUSCULAR | Status: DC
  Administered 2020-11-05 (×2): 60 mg via INTRAVENOUS
  Filled 2020-11-04 (×2): qty 2

## 2020-11-04 MED ORDER — PHENOL 1.4 % MT LIQD
1.0000 | OROMUCOSAL | Status: DC | PRN
  Administered 2020-11-04: 1 via OROMUCOSAL
  Filled 2020-11-04: qty 177

## 2020-11-04 MED ORDER — CHLORHEXIDINE GLUCONATE CLOTH 2 % EX PADS
6.0000 | MEDICATED_PAD | Freq: Every day | CUTANEOUS | Status: DC
  Administered 2020-11-04 – 2020-11-08 (×5): 6 via TOPICAL

## 2020-11-04 MED ORDER — INSULIN ASPART 100 UNIT/ML IJ SOLN
0.0000 [IU] | Freq: Three times a day (TID) | INTRAMUSCULAR | Status: DC
  Administered 2020-11-05: 2 [IU] via SUBCUTANEOUS
  Administered 2020-11-05: 1 [IU] via SUBCUTANEOUS
  Administered 2020-11-05 – 2020-11-07 (×5): 2 [IU] via SUBCUTANEOUS

## 2020-11-04 NOTE — Progress Notes (Signed)
1 Day Post-Op  Subjective: Patient intubated but awake.  Objective: Vital signs in last 24 hours: Temp:  [96.2 F (35.7 C)-97.7 F (36.5 C)] 96.9 F (36.1 C) (06/10 0400) Pulse Rate:  [67-110] 105 (06/10 0725) Resp:  [14-25] 15 (06/10 0725) BP: (89-205)/(65-124) 101/83 (06/10 0725) SpO2:  [97 %-100 %] 100 % (06/10 0725) FiO2 (%):  [30 %-40 %] 30 % (06/10 0725)    Intake/Output from previous day: 06/09 0701 - 06/10 0700 In: 5274.7 [I.V.:4174.7; IV Piggyback:1100] Out: 300 [Urine:150; Blood:50] Intake/Output this shift: No intake/output data recorded.  General appearance: alert and no distress Resp: clear to auscultation bilaterally and intubated Cardio: regular rate and rhythm, S1, S2 normal, no murmur, click, rub or gallop GI: Soft, incision healing well.  Minimal bowel sounds appreciated.  Lab Results:  Recent Labs    11/03/20 1448 11/04/20 0607  WBC 9.4 22.8*  HGB 14.6 14.1  HCT 44.5 44.2  PLT 234 231   BMET Recent Labs    11/03/20 1448 11/04/20 0607  NA 138 138  K 3.9 5.0  CL 105 106  CO2 22 20*  GLUCOSE 154* 213*  BUN 15 17  CREATININE 0.89 1.00  CALCIUM 10.2 8.8*   PT/INR No results for input(s): LABPROT, INR in the last 72 hours.  Studies/Results: CT Abdomen Pelvis W Contrast  Result Date: 11/03/2020 CLINICAL DATA:  Abdominal pain and vomiting for the past 2 days. EXAM: CT ABDOMEN AND PELVIS WITH CONTRAST TECHNIQUE: Multidetector CT imaging of the abdomen and pelvis was performed using the standard protocol following bolus administration of intravenous contrast. CONTRAST:  19mL OMNIPAQUE IOHEXOL 300 MG/ML  SOLN COMPARISON:  PET-CT dated July 07, 2020. FINDINGS: Lower chest: No acute abnormality. Bilateral perihilar and lower lobe post radiation changes. Chronic circumferential wall thickening of both lower esophagus. Hepatobiliary: No focal liver abnormality is seen. No gallstones, gallbladder wall thickening, or biliary dilatation. Pancreas:  Unremarkable. No pancreatic ductal dilatation or surrounding inflammatory changes. Spleen: Normal in size without focal abnormality. Adrenals/Urinary Tract: Adrenal glands are unremarkable. Kidneys are normal, without renal calculi, focal lesion, or hydronephrosis. Bladder is unremarkable. Stomach/Bowel: The stomach is within normal limits. Closed loop small bowel obstruction in the right pelvis with the obstructed closed loop demonstrating circumferential small bowel wall thickening and poor enhancement, concerning for ischemia. No pneumatosis. Additional mildly dilated small bowel loops proximal to the closed loop obstruction without wall thickening. The colon is unremarkable. Normal appendix. Vascular/Lymphatic: Aortic atherosclerosis. No enlarged abdominal or pelvic lymph nodes. Reproductive: Status post hysterectomy. No adnexal masses. Other: Small reactive ascites in the pelvis.  No pneumoperitoneum. Musculoskeletal: No acute or significant osseous findings. IMPRESSION: 1. Closed loop small bowel obstruction in the right pelvis with the obstructed closed loop demonstrating circumferential small bowel wall thickening and poor enhancement, consistent with ischemia. No pneumatosis or portal venous gas. No perforation. 2. Additional small bowel obstruction proximal to the closed loop without signs of ischemia. 3. Aortic Atherosclerosis (ICD10-I70.0). Critical Value/emergent results were called by telephone at the time of interpretation on 11/03/2020 at 4:59 pm to provider [facility] , who verbally acknowledged these results. Electronically Signed   By: Titus Dubin M.D.   On: 11/03/2020 16:59   DG Chest Port 1 View  Result Date: 11/04/2020 CLINICAL DATA:  Encounter for intubation in ICU post-op pt. EXAM: PORTABLE CHEST 1 VIEW COMPARISON:  Chest x-ray 11/03/2020 7:10 p.m., CT chest 04/18/2020 FINDINGS: Interval placement of an endotracheal tube 4.5 cm above the carina. Enteric tube again noted  coursing below  the hemidiaphragm with tip and side port overlying the expected region of the gastric lumen. The heart size and mediastinal contours are unchanged. Redemonstration of perihilar scarring. Low lung volumes with elevation of the left hemidiaphragm. No focal consolidation. No pulmonary edema. No pleural effusion. No pneumothorax. No acute osseous abnormality. IMPRESSION: 1. Low lung volumes. 2. Endotracheal tube and enteric tube in appropriate position. Electronically Signed   By: Iven Finn M.D.   On: 11/04/2020 00:32   DG Chest Portable 1 View  Result Date: 11/03/2020 CLINICAL DATA:  Nasogastric tube placement EXAM: PORTABLE CHEST 1 VIEW COMPARISON:  11/03/2020 at 5:33 p.m. FINDINGS: Nasogastric tube is in the stomach with side port in the stomach body and distal tip in the fundus region. Perihilar and basilar scarring is again observed. Low lung volumes are noted. Heart size within normal limits. There is residual contrast medium observed in the renal collecting systems. IMPRESSION: 1. Nasogastric tube tip is in the stomach fundus with side port in the stomach body. 2. Perihilar and basilar scarring in the lungs as on prior exams. Electronically Signed   By: Van Clines M.D.   On: 11/03/2020 19:46   DG Chest Portable 1 View  Result Date: 11/03/2020 CLINICAL DATA:  Preoperative evaluation. EXAM: PORTABLE CHEST 1 VIEW COMPARISON:  Oct 06, 2020 FINDINGS: Decreased lung volumes are seen. Mild, stable areas of linear scarring and/or atelectasis are noted within the bilateral lung bases and bilateral suprahilar regions. There is no evidence of acute infiltrate, pleural effusion or pneumothorax. The heart size and mediastinal contours are within normal limits. The visualized skeletal structures are unremarkable. IMPRESSION: Low lung volumes with stable areas of linear scarring and/or atelectasis. Electronically Signed   By: Virgina Norfolk M.D.   On: 11/03/2020 18:12     Anti-infectives: Anti-infectives (From admission, onward)    Start     Dose/Rate Route Frequency Ordered Stop   11/04/20 0600  cefoTEtan (CEFOTAN) 2 g in sodium chloride 0.9 % 100 mL IVPB  Status:  Discontinued        2 g 200 mL/hr over 30 Minutes Intravenous On call to O.R. 11/03/20 2312 11/04/20 0851   11/03/20 1715  cefTRIAXone (ROCEPHIN) 1 g in sodium chloride 0.9 % 100 mL IVPB        1 g 200 mL/hr over 30 Minutes Intravenous  Once 11/03/20 1708 11/03/20 1827       Assessment/Plan: s/p Procedure(s): EXPLORATORY LAPAROTOMY BOWEL RESECTION Impression: Stable on postoperative day 1, status post partial small bowel resection for ischemic necrosis.  Patient meets weaning requirements for extubation.  Continues on stress dose steroids, which would explain the elevated white blood cell count.  Urine output marginal.  We will follow.  Have asked Dr. Dyann Kief of the hospitalist service to follow along with [place].  LOS: 1 day    Aviva Signs 11/04/2020

## 2020-11-04 NOTE — Consult Note (Signed)
[facility] Medical Consultation  Kaitlin [name] ZOX:[id number] DOB: 09-12-1950 DOA: 11/03/2020 PCP: [name], MD   Requesting physician: Dr. Lovell Sheehan. Date of consultation: 11/04/20 Reason for consultation: assistance with medical management   Impression/Recommendations  Small bowel obstruction with ischemia: -Continue postoperative care by general surgery -NG tube still in place; physical examination demonstrated lack of bowel sounds Currently.  Acute on chronic resp failure with hypoxia:       -left intubated after surgery; good SBP and successful extubation.       -no wheezing, no crackles and not use of accessory muscles appreciated.        -continue chronic supplementation 2-3 L through [place]; goal is for O2 sat > 92%  3.    GERD/GI prophylaxis        -continue PPI  4.    HTN       -continue IV metoprolol  5.    Chronic steroid induced diabetes         -will check A1C         -follow CBG fluctuation         -will start SSI; not using hypoglycemic agents as an outpatient.  6.    Hx of DVT/PE:        -continue lovenox 150mg  daily when ok by general surgery.  7.  Chronic steroid usage       -stress dose given; will adjust and start slow tapering  8.   HLD:     -resume statins when tolerating PO's  I will followup again tomorrow. Please contact me if I can be of assistance in the meanwhile. Thank you for this consultation.  Chief Complaint: Small bowel obstruction with ischemia.  HPI:  70 year old black female with a history of metastatic lung carcinoma to the brain, presented with new onset lower abdominal pain, nausea and vomiting.  Work-up demonstrated a closed-loop obstruction of the small bowel in the pelvis with edema wall thickening, concerning for impending ischemia.  Patient admitted by general surgery service who took patient to the OR for exploratory laparotomy and bowel resection.  Internal medicine has been contacted to assist with medical management.  Apart  from cancer patient has a past medical history significant for steroid-induced diabetes, history of DVT with PE, hypertension, hyperlipidemia, COPD and chronic respiratory failure with hypoxia (chronically using 2-3 L nasal cannula supplementation).  Review of Systems:  Negative except as otherwise mentioned in HPI.  Past Medical History:  Diagnosis Date   Asthma    Cancer (HCC) 04/21/2018   Lung Cancer    Carpal tunnel syndrome    Cataract    left eye   Diabetes mellitus without complication (HCC)    DVT (deep venous thrombosis) (HCC)    Hypertension    Pneumonia 04/21/2018   Pulmonary embolism (HCC) 03/2018   Sepsis (HCC)    Past Surgical History:  Procedure Laterality Date   ABDOMINAL HYSTERECTOMY     biopsy of right breast     ESOPHAGOGASTRODUODENOSCOPY (EGD) WITH PROPOFOL N/A 09/12/2018   Procedure: ESOPHAGOGASTRODUODENOSCOPY (EGD) WITH PROPOFOL (scope in 1017, scope out 1032);  Surgeon: Lucretia Roers, MD;  Location: [facility];  Service: General;  Laterality: N/A;   FOOT SURGERY     implant left eye     INTRAOCULAR LENS INSERTION     IR GASTROSTOMY TUBE MOD SED  12/05/2018   LAPAROTOMY N/A 11/03/2020   Procedure: EXPLORATORY LAPAROTOMY BOWEL RESECTION;  Surgeon: Franky Macho, MD;  Location: [facility];  Service:  General;  Laterality: N/A;   PEG PLACEMENT N/A 09/12/2018   Procedure: PERCUTANEOUS ENDOSCOPIC GASTROSTOMY (PEG) PLACEMENT;  Surgeon: Lucretia Roers, MD;  Location: [facility];  Service: General;  Laterality: N/A;   Social History:  reports that she has never smoked. She has never used smokeless tobacco. She reports previous alcohol use. She reports that she does not use drugs.  Allergies  Allergen Reactions   Banana Anaphylaxis   Aspirin Other (See Comments)    G.IDorena Dew    Family History  Problem Relation Age of Onset   Heart failure Mother    Stroke Mother    Heart attack Mother    Kidney failure Other    Leukemia Father     Prior to Admission  medications   Medication Sig Start Date End Date Taking? Authorizing Provider  albuterol (PROVENTIL HFA;VENTOLIN HFA) 108 (90 BASE) MCG/ACT inhaler Inhale 2 puffs into the lungs every 6 (six) hours as needed for wheezing.   Yes [provider]  amLODipine (NORVASC) 10 MG tablet Take 10 mg by mouth daily. 05/13/20  Yes [provider]  atorvastatin (LIPITOR) 20 MG tablet Take 1 tablet by mouth daily. 09/26/20  Yes [provider]  Colchicine (MITIGARE) 0.6 MG CAPS Take 0.6 mg by mouth daily as needed. Patient taking differently: Take 0.6 mg by mouth daily as needed (gout). 12/04/19  Yes Doreatha Massed, MD  enoxaparin (LOVENOX) 150 MG/ML injection Inject 1 mL (150 mg total) into the skin daily. 12/06/18  Yes Johnson, Clanford L, MD  hydrocortisone (ANUSOL-HC) 2.5 % rectal cream Place 1 application rectally daily as needed. 11/19/18  Yes [provider]  latanoprost (XALATAN) 0.005 % ophthalmic solution Place 1 drop into both eyes in the morning and at bedtime.  01/14/20  Yes [provider]  megestrol (MEGACE) 400 MG/10ML suspension Take 10 mLs (400 mg total) by mouth 2 (two) times daily. 10/13/20  Yes Doreatha Massed, MD  osimertinib mesylate (TAGRISSO) 40 MG tablet Take 3 tablets (120 mg total) by mouth daily. 10/13/20  Yes Doreatha Massed, MD  pantoprazole (PROTONIX) 40 MG tablet TAKE 1 TABLET BY MOUTH ONCE DAILY. Patient taking differently: Take 40 mg by mouth daily. 03/25/19  Yes Lockamy, Randi L, NP-C  potassium chloride (KLOR-CON) 10 MEQ tablet TAKE (2) TABLETS BY MOUTH TWICE DAILY. Patient taking differently: Take 20 mEq by mouth 2 (two) times daily. Patient states that she is taking 04/14/20  Yes Doreatha Massed, MD  predniSONE (DELTASONE) 50 MG tablet Take one tablet daily for 5 days Patient taking differently: Take 50 mg by mouth daily with breakfast. Take one tablet daily for 5 days 10/07/20  Yes Idol, Raynelle Fanning, PA-C  zolpidem  (AMBIEN CR) 6.25 MG CR tablet Take 1 tablet (6.25 mg total) by mouth at bedtime as needed for sleep. 10/16/18  Yes Doreatha Massed, MD   Physical Exam: Blood pressure (!) 180/89, pulse 95, temperature (!) 96.9 F (36.1 C), temperature source Axillary, resp. rate 19, height 5\' 7"  (1.702 m), SpO2 99 %. Vitals:   11/04/20 2000 11/04/20 2005  BP: (!) 180/89   Pulse: 95   Resp: 19   Temp:    SpO2: 100% 99%    General: Status post extubation; slightly hoarse on examination, following commands appropriately, no fever and in no acute distress.  Patient denies chest pain. Eyes: PERRLA, extraocular muscles intact.  No icterus ENT: NG tube in place; no ears or nostrils discharges appreciated. Slight Dry MM appreciated. Cardiovascular: Positive systolic ejection  murmur, no rubs, no gallops no JVD on exam. Respiratory: Scattered rhonchi bilaterally; no wheezing, no using accessory muscles.  Good oxygen saturation on 2-3 L currently.  Patient has been successfully extubated. Abdomen/Skin: Clean, dry and intact midline abdominal wound. Bowel sounds absent currently. NGT in place. Musculoskeletal: No cyanosis or clubbing; no edema appreciated Psychiatric: No hallucinations or agitation, stable mood. Neurologic: Somnolent but appropriate; easily aroused, oriented x3 and following commands.  Labs on Admission:  Basic Metabolic Panel: Recent Labs  Lab 11/03/20 1448 11/04/20 0607  NA 138 138  K 3.9 5.0  CL 105 106  CO2 22 20*  GLUCOSE 154* 213*  BUN 15 17  CREATININE 0.89 1.00  CALCIUM 10.2 8.8*  MG  --  1.8  PHOS  --  4.6   Liver Function Tests: Recent Labs  Lab 11/03/20 1448  AST 18  ALT 13  ALKPHOS 58  BILITOT 1.1  PROT 7.6  ALBUMIN 4.4   Recent Labs  Lab 11/03/20 1448  LIPASE 29    CBC: Recent Labs  Lab 11/03/20 1448 11/04/20 0607  WBC 9.4 22.8*  NEUTROABS 7.6  --   HGB 14.6 14.1  HCT 44.5 44.2  MCV 87.1 90.4  PLT 234 231    Radiological Exams on  Admission: CT Abdomen Pelvis W Contrast  Result Date: 11/03/2020 CLINICAL DATA:  Abdominal pain and vomiting for the past 2 days. EXAM: CT ABDOMEN AND PELVIS WITH CONTRAST TECHNIQUE: Multidetector CT imaging of the abdomen and pelvis was performed using the standard protocol following bolus administration of intravenous contrast. CONTRAST:  OMNIPAQUE IOHEXOL 300 MG/ML  SOLN COMPARISON:  PET-CT dated July 07, 2020. FINDINGS: Lower chest: No acute abnormality. Bilateral perihilar and lower lobe post radiation changes. Chronic circumferential wall thickening of both lower esophagus. Hepatobiliary: No focal liver abnormality is seen. No gallstones, gallbladder wall thickening, or biliary dilatation. Pancreas: Unremarkable. No pancreatic ductal dilatation or surrounding inflammatory changes. Spleen: Normal in size without focal abnormality. Adrenals/Urinary Tract: Adrenal glands are unremarkable. Kidneys are normal, without renal calculi, focal lesion, or hydronephrosis. Bladder is unremarkable. Stomach/Bowel: The stomach is within normal limits. Closed loop small bowel obstruction in the right pelvis with the obstructed closed loop demonstrating circumferential small bowel wall thickening and poor enhancement, concerning for ischemia. No pneumatosis. Additional mildly dilated small bowel loops proximal to the closed loop obstruction without wall thickening. The colon is unremarkable. Normal appendix. Vascular/Lymphatic: Aortic atherosclerosis. No enlarged abdominal or pelvic lymph nodes. Reproductive: Status post hysterectomy. No adnexal masses. Other: Small reactive ascites in the pelvis.  No pneumoperitoneum. Musculoskeletal: No acute or significant osseous findings. IMPRESSION: 1. Closed loop small bowel obstruction in the right pelvis with the obstructed closed loop demonstrating circumferential small bowel wall thickening and poor enhancement, consistent with ischemia. No pneumatosis or portal venous  gas. No perforation. 2. Additional small bowel obstruction proximal to the closed loop without signs of ischemia. 3. Aortic Atherosclerosis (ICD10-I70.0). Critical Value/emergent results were called by telephone at the time of interpretation on 11/03/2020 at 4:59 pm to provider [facility] , who verbally acknowledged these results. Electronically Signed   By: Obie Dredge M.D.   On: 11/03/2020 16:59   DG Chest Port 1 View  Result Date: 11/04/2020 CLINICAL DATA:  Encounter for intubation in ICU post-op pt. EXAM: PORTABLE CHEST 1 VIEW COMPARISON:  Chest x-ray 11/03/2020 7:10 p.m., CT chest 04/18/2020 FINDINGS: Interval placement of an endotracheal tube 4.5 cm above the carina. Enteric tube again noted coursing below the hemidiaphragm  with tip and side port overlying the expected region of the gastric lumen. The heart size and mediastinal contours are unchanged. Redemonstration of perihilar scarring. Low lung volumes with elevation of the left hemidiaphragm. No focal consolidation. No pulmonary edema. No pleural effusion. No pneumothorax. No acute osseous abnormality. IMPRESSION: 1. Low lung volumes. 2. Endotracheal tube and enteric tube in appropriate position. Electronically Signed   By: Tish Frederickson M.D.   On: 11/04/2020 00:32   DG Chest Portable 1 View  Result Date: 11/03/2020 CLINICAL DATA:  Nasogastric tube placement EXAM: PORTABLE CHEST 1 VIEW COMPARISON:  11/03/2020 at 5:33 p.m. FINDINGS: Nasogastric tube is in the stomach with side port in the stomach body and distal tip in the fundus region. Perihilar and basilar scarring is again observed. Low lung volumes are noted. Heart size within normal limits. There is residual contrast medium observed in the renal collecting systems. IMPRESSION: 1. Nasogastric tube tip is in the stomach fundus with side port in the stomach body. 2. Perihilar and basilar scarring in the lungs as on prior exams. Electronically Signed   By: Gaylyn Rong M.D.   On:  11/03/2020 19:46   DG Chest Portable 1 View  Result Date: 11/03/2020 CLINICAL DATA:  Preoperative evaluation. EXAM: PORTABLE CHEST 1 VIEW COMPARISON:  Oct 06, 2020 FINDINGS: Decreased lung volumes are seen. Mild, stable areas of linear scarring and/or atelectasis are noted within the bilateral lung bases and bilateral suprahilar regions. There is no evidence of acute infiltrate, pleural effusion or pneumothorax. The heart size and mediastinal contours are within normal limits. The visualized skeletal structures are unremarkable. IMPRESSION: Low lung volumes with stable areas of linear scarring and/or atelectasis. Electronically Signed   By: Aram Candela M.D.   On: 11/03/2020 18:12    Telemetry examined; normal sinus rhythm appreciated. No acute ischemic changes.  Time spent: 45 minutes.  Vassie Loll [facility] Pager [phone]  If 7PM-7AM, please contact night-coverage [email or web address] Password [email or web address] 11/04/2020, 8:48 PM

## 2020-11-04 NOTE — Procedures (Signed)
Extubation Procedure Note  Patient Details:   Name: Kaitlin [name] DOB: June 19, 1950 MRN: [id number]   Airway Documentation:    Vent end date: 11/04/20 Vent end time: 0850   Evaluation  O2 sats: stable throughout Complications: No apparent complications Patient did tolerate procedure well. Bilateral Breath Sounds: Clear   Yes  Pt tolerated weaning, achieved -20 NIF, positive for cuff leak, extubated to 4L [place]. No stridor or dyspnea noted after extubation.   Mariam Dollar 11/04/2020, 8:56 AM

## 2020-11-04 NOTE — Progress Notes (Signed)
[facility] night shift.  The nursing staff reported earlier that the patient's blood pressure has been progressively higher in the evening.  She is currently n.p.o. with NG tube suctioning.  She takes amlodipine 10 mg p.o. at home and has not had a dose today.  Her heart rate has been in the 90s or low 100s.  I will start metoprolol 5 mg IVP every 8 hours and hydralazine 50 mg IVP every 4 hours as needed for SBP higher than 149 mmHg.  Tennis Must, MD.

## 2020-11-04 NOTE — Plan of Care (Addendum)
  Problem: Acute Rehab PT Goals(only PT should resolve) Goal: Pt Will Go Sit To Supine/Side 11/04/2020 1347 by Cousler, Jeneen Rinks, Student-PT Outcome: Progressing Flowsheets (Taken 11/04/2020 1347) Pt will go Sit to Supine/Side: Independently 11/04/2020 1347 by Sinclair Ship, Student-PT Outcome: Progressing Goal: Patient Will Transfer Sit To/From Stand 11/04/2020 1347 by Sinclair Ship, Student-PT Outcome: Progressing Flowsheets (Taken 11/04/2020 1347) Patient will transfer sit to/from stand:  with min guard assist  with supervision 11/04/2020 1347 by Sinclair Ship, Student-PT Outcome: Progressing Goal: Pt Will Transfer Bed To Chair/Chair To Bed 11/04/2020 1347 by Sinclair Ship, Student-PT Outcome: Progressing Flowsheets (Taken 11/04/2020 1347) Pt will Transfer Bed to Chair/Chair to Bed: (with RW)  min guard assist  with supervision 11/04/2020 1347 by Sinclair Ship, Student-PT Outcome: Progressing Goal: Pt Will Ambulate 11/04/2020 1347 by Sinclair Ship, Student-PT Outcome: Progressing Flowsheets (Taken 11/04/2020 1347) Pt will Ambulate:  50 feet  with modified independence  with rolling walker 11/04/2020 1347 by Sinclair Ship, Student-PT Outcome: Progressing   1:48 PM, 11/04/20 Sinclair Ship SPT  1:58 PM, 11/04/20 Lonell Grandchild, MPT Physical Therapist with [facility] [phone] office [phone] mobile phone

## 2020-11-04 NOTE — Care Management Important Message (Signed)
Important Message  Patient Details  Name: Kaitlin [name] MRN: [id number] Date of Birth: 07/01/1950   Medicare Important Message Given:  Yes     Tommy Medal 11/04/2020, 9:49 AM

## 2020-11-04 NOTE — Evaluation (Addendum)
Physical Therapy Evaluation Patient Details Name: Kaitlin [name] MRN: [id number] DOB: 18-May-1951 Today's Date: 11/04/2020   History of Present Illness  Kaitlin [name] is an 70 y.o. female. Patient is a 70 year old black female with a history of metastatic lung carcinoma to the brain who presents with new onset lower abdominal pain, nausea, and vomiting.  It started early this morning.  She presented to the emergency room due to the persistence of the symptoms.  CT scan of the abdomen pelvis reveals a closed-loop obstruction of small bowel in the pelvis with edema and bowel wall thickening, concerning for impending ischemia.  Patient has never had this before.  She has been followed by oncology for her metastatic lung carcinoma to the brain.  She finished radiation therapy to the brain in March 2022.  She does take steroids daily.  She is also on Lovenox injections, the last 1 given yesterday.  She has not taken any of her medications today.   Clinical Impression  Patient presented alert, oriented and agreeable to therapy. Patient demonstrated fair bed mobility with verbal and tactile cues for min/mod assist. Patient demonstrated good seated balance and EOB with min/mod assist for sit to stand. Patient required mod assist for ambulation and stand pivot transfer to the chair. Patient tolerated therapy well and was left in chair with family present - nursing staff notified. Patient will benefit from continued physical therapy in hospital and recommended venue below to increase strength, balance, endurance for safe ADLs and gait.     Follow Up Recommendations SNF;Supervision for mobility/OOB;Supervision - Intermittent    Equipment Recommendations  None recommended by PT    Recommendations for Other Services       Precautions / Restrictions Precautions Precautions: Fall Restrictions Weight Bearing Restrictions: No      Mobility  Bed Mobility Overal bed mobility: Needs Assistance Bed Mobility:  Supine to Sit     Supine to sit: Mod assist     General bed mobility comments: Slow labored movement with verbal cues Patient Response: Cooperative  Transfers Overall transfer level: Needs assistance Equipment used: Rolling walker (2 wheeled) Transfers: Sit to/from [employer] Sit to Stand: Mod assist Stand pivot transfers: Mod assist       General transfer comment: Slow labored movement  Ambulation/Gait Ambulation/Gait assistance: Mod assist Gait Distance (Feet): 5 Feet Assistive device: Rolling walker (2 wheeled) Gait Pattern/deviations: Decreased step length - right;Decreased step length - left;Decreased stride length Gait velocity: decreased   General Gait Details: required verbal cues, slow labored cadence  Stairs            Wheelchair Mobility    Modified Rankin (Stroke Patients Only)       Balance Overall balance assessment: Needs assistance Sitting-balance support: Bilateral upper extremity supported;Feet supported Sitting balance-Leahy Scale: Fair Sitting balance - Comments: fair/poor at EOB Postural control: Right lateral lean Standing balance support: Bilateral upper extremity supported;During functional activity Standing balance-Leahy Scale: Fair Standing balance comment: fair/poor with RW                             Pertinent Vitals/Pain Pain Assessment: Faces Faces Pain Scale: Hurts little more Pain Location: abdomen Pain Descriptors / Indicators: Sore    Home Living Family/patient expects to be discharged to:: Private residence Living Arrangements: Children Available Help at Discharge: Family;Available 24 hours/day Type of Home: House Home Access: Stairs to enter Entrance Stairs-Rails: Right Entrance Stairs-Number of Steps: 6 Home  Layout: One level Home Equipment: Walker - 2 wheels;Cane - single point;Bedside commode;Grab bars - tub/shower;Shower seat - built in Additional Comments: Daughter lives with  her    Prior Function Level of Independence: Independent with assistive device(s)         Comments: Patient is a Engineer, technical sales with [employer]     Higher education careers adviser        Extremity/Trunk Assessment   Upper Extremity Assessment Upper Extremity Assessment: Generalized weakness    Lower Extremity Assessment Lower Extremity Assessment: Generalized weakness    Cervical / Trunk Assessment Cervical / Trunk Assessment: Kyphotic  Communication   Communication: No difficulties  Cognition Arousal/Alertness: Awake/alert Behavior During Therapy: WFL for tasks assessed/performed Overall Cognitive Status: Within Functional Limits for tasks assessed                                        General Comments      Exercises     Assessment/Plan    PT Assessment Patient needs continued PT services  PT Problem List Decreased strength;Decreased activity tolerance;Decreased balance;Decreased mobility;Decreased coordination       PT Treatment Interventions DME instruction;Gait training;Functional mobility training;Therapeutic activities;Therapeutic exercise;Balance training;Patient/family education    PT Goals (Current goals can be found in the Care Plan section)  Acute Rehab PT Goals Patient Stated Goal: return home PT Goal Formulation: With patient/family Time For Goal Achievement: 11/18/20 Potential to Achieve Goals: Good    Frequency Min 3X/week   Barriers to discharge        Co-evaluation               AM-PAC PT "6 Clicks" Mobility  Outcome Measure Help needed turning from your back to your side while in a flat bed without using bedrails?: A Lot Help needed moving from lying on your back to sitting on the side of a flat bed without using bedrails?: A Lot Help needed moving to and from a bed to a chair (including a wheelchair)?: A Lot Help needed standing up from a chair using your arms (e.g., wheelchair or bedside chair)?: A Lot Help needed to walk  in hospital room?: A Lot Help needed climbing 3-5 steps with a railing? : Total 6 Click Score: 11    End of Session Equipment Utilized During Treatment: Oxygen Activity Tolerance: Patient tolerated treatment well;Patient limited by fatigue Patient left: in chair;with call bell/phone within reach;with family/visitor present Nurse Communication: Mobility status PT Visit Diagnosis: Unsteadiness on feet (R26.81);Other abnormalities of gait and mobility (R26.89);Muscle weakness (generalized) (M62.81)    Time: 4098-1191 PT Time Calculation (min) (ACUTE ONLY): 29 min   Charges:   PT Evaluation $PT Eval Moderate Complexity: 1 Mod PT Treatments $Therapeutic Activity: 23-37 mins      2:02 PM, 11/04/20 Earnestine Tuohey Cousler SPT  2:03 PM, 11/04/20 Ocie Bob, MPT Physical Therapist with [facility] [phone] office [phone] mobile phone

## 2020-11-04 NOTE — Progress Notes (Signed)
IS placed in room, patient resting at this time.

## 2020-11-04 NOTE — Progress Notes (Signed)
Pt placed on CPAP/PSV at this time and is tolerating well. RT will continue to monitor.

## 2020-11-05 ENCOUNTER — Inpatient Hospital Stay (HOSPITAL_COMMUNITY): Payer: Medicare Other

## 2020-11-05 DIAGNOSIS — I6521 Occlusion and stenosis of right carotid artery: Secondary | ICD-10-CM | POA: Diagnosis not present

## 2020-11-05 LAB — BASIC METABOLIC PANEL
Anion gap: 6 (ref 5–15)
BUN: 18 mg/dL (ref 8–23)
CO2: 23 mmol/L (ref 22–32)
Calcium: 9 mg/dL (ref 8.9–10.3)
Chloride: 106 mmol/L (ref 98–111)
Creatinine, Ser: 0.8 mg/dL (ref 0.44–1.00)
GFR, Estimated: >60 mL/min (ref >60)
Glucose, Bld: 213 mg/dL — ABNORMAL HIGH (ref 70–99)
Potassium: 4.2 mmol/L (ref 3.5–5.1)
Sodium: 135 mmol/L (ref 135–145)

## 2020-11-05 LAB — CBC
HCT: 34.8 % — ABNORMAL LOW (ref 36.0–46.0)
HCT: 32.9 % — ABNORMAL LOW (ref 36.0–46.0)
Hemoglobin: 11.4 g/dL — ABNORMAL LOW (ref 12.0–15.0)
Hemoglobin: 11.1 g/dL — ABNORMAL LOW (ref 12.0–15.0)
MCH: 28.5 pg (ref 26.0–34.0)
MCH: 29 pg (ref 26.0–34.0)
MCHC: 32.8 g/dL (ref 30.0–36.0)
MCHC: 33.7 g/dL (ref 30.0–36.0)
MCV: 87 fL (ref 80.0–100.0)
MCV: 85.9 fL (ref 80.0–100.0)
Platelets: 188 10*3/uL (ref 150–400)
Platelets: 207 10*3/uL (ref 150–400)
RBC: 4 MIL/uL (ref 3.87–5.11)
RBC: 3.83 MIL/uL — ABNORMAL LOW (ref 3.87–5.11)
RDW: 15.9 % — ABNORMAL HIGH (ref 11.5–15.5)
RDW: 16 % — ABNORMAL HIGH (ref 11.5–15.5)
WBC: 16.5 10*3/uL — ABNORMAL HIGH (ref 4.0–10.5)
WBC: 17 10*3/uL — ABNORMAL HIGH (ref 4.0–10.5)
nRBC: 0 % (ref 0.0–0.2)
nRBC: 0 % (ref 0.0–0.2)

## 2020-11-05 LAB — GLUCOSE, CAPILLARY
Glucose-Capillary: 190 mg/dL — ABNORMAL HIGH (ref 70–99)
Glucose-Capillary: 139 mg/dL — ABNORMAL HIGH (ref 70–99)
Glucose-Capillary: 161 mg/dL — ABNORMAL HIGH (ref 70–99)
Glucose-Capillary: 156 mg/dL — ABNORMAL HIGH (ref 70–99)
Glucose-Capillary: 136 mg/dL — ABNORMAL HIGH (ref 70–99)

## 2020-11-05 LAB — URINE CULTURE: Culture: NO GROWTH

## 2020-11-05 LAB — COMPREHENSIVE METABOLIC PANEL
ALT: 19 U/L (ref 0–44)
AST: 24 U/L (ref 15–41)
Albumin: 3 g/dL — ABNORMAL LOW (ref 3.5–5.0)
Alkaline Phosphatase: 37 U/L — ABNORMAL LOW (ref 38–126)
Anion gap: 7 (ref 5–15)
BUN: 19 mg/dL (ref 8–23)
CO2: 23 mmol/L (ref 22–32)
Calcium: 9.2 mg/dL (ref 8.9–10.3)
Chloride: 104 mmol/L (ref 98–111)
Creatinine, Ser: 0.78 mg/dL (ref 0.44–1.00)
GFR, Estimated: >60 mL/min (ref >60)
Glucose, Bld: 157 mg/dL — ABNORMAL HIGH (ref 70–99)
Potassium: 3.9 mmol/L (ref 3.5–5.1)
Sodium: 134 mmol/L — ABNORMAL LOW (ref 135–145)
Total Bilirubin: 0.6 mg/dL (ref 0.3–1.2)
Total Protein: 6.2 g/dL — ABNORMAL LOW (ref 6.5–8.1)

## 2020-11-05 LAB — HEMOGLOBIN AND HEMATOCRIT, BLOOD
HCT: 33.8 % — ABNORMAL LOW (ref 36.0–46.0)
Hemoglobin: 11.3 g/dL — ABNORMAL LOW (ref 12.0–15.0)

## 2020-11-05 LAB — MAGNESIUM: Magnesium: 2 mg/dL (ref 1.7–2.4)

## 2020-11-05 LAB — PHOSPHORUS: Phosphorus: 1.8 mg/dL — ABNORMAL LOW (ref 2.5–4.6)

## 2020-11-05 MED ORDER — METHYLPREDNISOLONE SODIUM SUCC 40 MG IJ SOLR
40.0000 mg | Freq: Two times a day (BID) | INTRAMUSCULAR | Status: DC
  Administered 2020-11-06 – 2020-11-07 (×3): 40 mg via INTRAVENOUS
  Filled 2020-11-05 (×3): qty 1

## 2020-11-05 MED ORDER — STROKE: EARLY STAGES OF RECOVERY BOOK
Freq: Once | Status: AC
  Filled 2020-11-05: qty 1

## 2020-11-05 MED ORDER — IOHEXOL 350 MG/ML SOLN
100.0000 mL | Freq: Once | INTRAVENOUS | Status: AC | PRN
  Administered 2020-11-05: 100 mL via INTRAVENOUS

## 2020-11-05 NOTE — Progress Notes (Signed)
Subjective:  CC: Kaitlin [name] is a 70 y.o. female  Hospital stay day 2, 2 Days Post-Op SB resection  HPI: No acute issues reported overnight.  No flatus or BM reported. Pain overall is about the same per patient.  ROS:  General: Denies weight loss, weight gain, fatigue, fevers, chills, and night sweats. Heart: Denies chest pain, palpitations, racing heart, irregular heartbeat, leg pain or swelling, and decreased activity tolerance. Respiratory: Denies breathing difficulty, shortness of breath, wheezing, cough, and sputum. GI: Denies change in appetite, heartburn, nausea, vomiting, diarrhea, and blood in stool. GU: Denies difficulty urinating, pain with urinating, urgency, frequency, blood in urine.   Objective:   Temp:  [98.5 F (36.9 C)-99.4 F (37.4 C)] 98.9 F (37.2 C) (06/11 0745) Pulse Rate:  [89-118] 106 (06/11 1000) Resp:  [12-27] 20 (06/11 1000) BP: (115-191)/(74-103) 149/86 (06/11 1000) SpO2:  [97 %-100 %] 97 % (06/11 1000)     Height: 5\' 7"  (170.2 cm)       Intake/Output this shift:   Intake/Output Summary (Last 24 hours) at 11/05/2020 1037 Last data filed at 11/05/2020 1033 Gross per 24 hour  Intake 2848.19 ml  Output 1100 ml  Net 1748.19 ml    Constitutional :  alert, cooperative, appears stated age, and no distress  Respiratory:  clear to auscultation bilaterally  Cardiovascular:  regular rate and rhythm  Gastrointestinal: Soft, no guarding, appropriate TTP around incision site, slightly increased TTP in LLQ .   Skin: Cool and moist. Staple line dressing c/d/I.  Psychiatric: Normal affect, non-agitated, not confused       LABS:  CMP Latest Ref Rng & Units 11/05/2020 11/04/2020 11/03/2020  Glucose 70 - 99 mg/dL 213(H) 213(H) 154(H)  BUN 8 - 23 mg/dL 18 17 15   Creatinine 0.44 - 1.00 mg/dL 0.80 1.00 0.89  Sodium 135 - 145 mmol/L 135 138 138  Potassium 3.5 - 5.1 mmol/L 4.2 5.0 3.9  Chloride 98 - 111 mmol/L 106 106 105  CO2 22 - 32 mmol/L 23 20(L) 22  Calcium  8.9 - 10.3 mg/dL 9.0 8.8(L) 10.2  Total Protein 6.5 - 8.1 g/dL - - 7.6  Total Bilirubin 0.3 - 1.2 mg/dL - - 1.1  Alkaline Phos 38 - 126 U/L - - 58  AST 15 - 41 U/L - - 18  ALT 0 - 44 U/L - - 13   CBC Latest Ref Rng & Units 11/05/2020 11/04/2020 11/03/2020  WBC 4.0 - 10.5 K/uL 16.5(H) 22.8(H) 9.4  Hemoglobin 12.0 - 15.0 g/dL 11.4(L) 14.1 14.6  Hematocrit 36.0 - 46.0 % 34.8(L) 44.2 44.5  Platelets 150 - 400 K/uL 188 231 234    RADS: N/a Assessment:   S/p SB resection.  NG with thin gastric output.  Continue NG decompression for likely post op ileus.  Ice chips, gum and hard candy ok.  Will hold off on resuming lovenox due to drop in hgb this am.  No clinical signs of active bleeding but will recheck H&H in the pm.  Can start tapering down stress dose steroids.  Further care per ICU/hospitalist team.  Appreciate input.

## 2020-11-05 NOTE — Progress Notes (Signed)
Hold hypertensive meds and BP goal of <220/<120 per MD

## 2020-11-05 NOTE — Progress Notes (Signed)
TRH night shift transfer summary.  [name]. [name] is a 70 year old female with a past medical history of lung cancer with brain metastasis, GERD, hypertension, chronic steroid-induced diabetes, history of DVT/PE, hyperlipidemia, carpal tunnel syndrome, asthma who was admitted by general surgery on 11/03/2020 due to closed-loop SBO, on [facility] service as a consult since 11/04/2020 who showed new left-sided deficits in the setting of right ICA occlusion with normal perfusion scan.   2016 I discussed the details of the case and earlier in the evening evening events with Dr. Kyung [name].  CTA head and neck was still pending and was signed out to night shift.  2028 Discussed the case and imaging findings with Dr. Leonel [name] who was on-for teleneurology and recommended transfer to [facility] so she can be followed by neurology.  He does not feel that she is a candidate for any type of acute revascularization treatment but believes that she might be appropriate for hypertonic saline infusion at some point if she develops significant edema.  I have attached the findings of his examination below.  1A: Level of Consciousness - 0 1B: Ask Month and Age - 2 1C: 'Blink Eyes' & 'Squeeze Hands' - 0 2: Test Horizontal Extraocular Movements - 2 3: Test Visual Fields - 2 4: Test Facial Palsy - 2 5A: Test Left Arm Motor Drift - 1 5B: Test Right Arm Motor Drift - 0 6A: Test Left Leg Motor Drift - 2 6B: Test Right Leg Motor Drift - 1 7: Test Limb Ataxia - 0 8: Test Sensation - 2 9: Test Language/Aphasia- 0 10: Test Dysarthria - 1 11: Test Extinction/Inattention - 2 NIHSS score: 17  2132 He spoke to the family about transferring the patient to [facility] since we do not have neurology service at this time. He also confirmed her CODE STATUS made the patient DNR.  She has been added to the neurology rounding list.  2139 I saw the patient at bedside. Most recent vital signs were temperature 99.4 F, pulse 118, respirations 20, BP  175/95 mmHg. her NG tube was in place.  Lungs were clear to auscultation.  Cardiovascular S1-S2, RRR.  Abdomen is mildly distended.  Suture dressing was clear.  Soft, nontender.  Extremities without edema.  Neurological changes were the same as earlier.  I notified her about the need for transfer to a higher level of care.  She voiced understanding.  2158 I spoke to Dr. Orpah [name] from [facility] and we will transfer to the progressive unit but [facility] would accept if the patient needs hypertonic saline infusion at some point.  Dr. Leonel [name] is okay with this option.  2213 I spoke to bed placement about her case and ask them to prioritize her room request and [facility] transportation.   2229 I also spoke to Dr. Donne [name] who was on-call for [facility] surgery and they will be following at [facility].  He has already placed the patient on the [facility] rounding list.  2233 A new DNR form was filled out for her to [address] transport team request.  Tennis Must, MD.  Over 60 minutes of critical care time were spent reviewing the chart, discussing the case with multiple colleagues, examining the patient and coordinating with bed placement during the process of this transfer.  This document was prepared using Dragon voice recognition software and may contain some unintended transcription errors.

## 2020-11-05 NOTE — Progress Notes (Addendum)
PROGRESS NOTE  Kaitlin [name] DGU:[id number] DOB: 06/01/1950 DOA: 11/03/2020 PCP: [name], MD  HPI/Recap of past 67 hours: 70 year old female who was admitted for abdominal pain nausea vomiting found to have ischemic bowel status post bowel resection on November 03, 2020.  She is postop day 2 Patient seen and examined at bedside. Patient was slow to answer questions she knew she was in the hospital hospital but did not know which hospital Nurse later called my attention that patient was not responding to her left side and that there is a change in her condition examination revealed weakness on the left side with slow speech.  Assessment/Plan: Active Problems:   Ischemic necrosis of small bowel (HCC) #1 ischemic small bowel necrosis status post small bowel resection.  Continue IV fluid patient is still n.p.o. with NG tube in place  2.  Acute onset left hemi-(with left neglect possible stroke.  Code stroke was called she was taking for start CT scan..  Did not reveal any acute stroke.  This was communicated to me by a call from the radiologist.  Teleneurology was activated.  Warm handoff was given to the night physician for recommendations.  Due to the recent ischemic stroke status post resection her Lovenox was held until [facility] by general surgery.  I spoke with the surgeon Dr. Lovell Sheehan who stated patient can be restarted on Lovenox if she is having a stroke  3.  Uncontrolled hypertension patient is hydralazine IV as well as metoprolol IV  4.  Acute on chronic respiratory failure with hypoxia she was status post intubation and left intubated after surgery but she has been successfully extubated her oxygenation is normal  5.  Chronic steroid induced diabetes continue sliding scale  6.  Neck steroid use she was started on stress dose steroid due to surgery.  We will begin tapering it tomorrow starting at lower dose to 40 mg every 12 hours.  7.  Metastatic breast cancer to the brain patient has  undergone treatment  Code Status: DNR  Severity of Illness: The appropriate patient status for this patient is INPATIENT. Inpatient status is judged to be reasonable and necessary in order to provide the required intensity of service to ensure the patient's safety. The patient's presenting symptoms, physical exam findings, and initial radiographic and laboratory data in the context of their chronic comorbidities is felt to place them at high risk for further clinical deterioration. Furthermore, it is not anticipated that the patient will be medically stable for discharge from the hospital within 2 midnights of admission. The following factors support the patient status of inpatient.   " The patient's presenting symptoms include ischemic bowel status post surgery 2 days ago " The worrisome physical exam findings include new onset left-sided neglect with left upper and lower extremity weakness.  Patient still has NG tube in low intermittent suction " The initial radiographic and laboratory data are worrisome because of possible stroke. " The chronic co-morbidities include metastatic brain   * I certify that at the point of admission it is my clinical judgment that the patient will require inpatient hospital care spanning beyond 2 midnights from the point of admission due to high intensity of service, high risk for further deterioration and high frequency of surveillance required.*   Family Communication: Patient  Disposition Plan: Home in stable   Consultants: Telemetry neurology General surgery  Procedures: Status post small bowel resection postop day 2  Antimicrobials: None  DVT prophylaxis: SCD DVT prophylaxis with Lovenox  is on hold due to recent bowel resection   Objective: Vitals:   11/05/20 1500 11/05/20 1556 11/05/20 1700 11/05/20 1704  BP: (!) 183/92  (!) 193/88 (!) 193/88  Pulse: 89  88   Resp: 18  15   Temp:      TempSrc:      SpO2: 97%  97%   Height:  5\' 7"  (1.702  m)      Intake/Output Summary (Last 24 hours) at 11/05/2020 1946 Last data filed at 11/05/2020 1500 Gross per 24 hour  Intake 1940.24 ml  Output 1500 ml  Net 440.24 ml   There were no vitals filed for this visit. Body mass index is 29.38 kg/m.  Exam:  General: 70 y.o. year-old female well developed well nourished in no acute distress.  Alert and oriented x3.  Patient answers questions appropriately but she was a little slow.  NG tube is in place in the nose and draining Cardiovascular: Regular rate and rhythm with no rubs or gallops.  No thyromegaly or JVD noted.   Respiratory: Clear to auscultation with no wheezes or rales. Good inspiratory effort. Abdomen: Soft nontender nondistended with normal bowel sounds x4 quadrants.  NG tube is in place Musculoskeletal: No lower extremity edema. 2/4 pulses in all 4 extremities. Skin: No ulcerative lesions noted or rashes, Psychiatry: Mood is appropriate for condition and setting Neurological exam.  Patient was alert oriented to place but she could not say which hospital she was then she knew she was in the hospital when I told her and the pain she said yes.  Patient was noted to be low looking to the  right side only.  Her grip on the left side was weaker she was able to lifted up but not as much her lower extremity was also weaker on the left.    Data Reviewed: CBC: Recent Labs  Lab 11/03/20 1448 11/04/20 0607 11/05/20 0451 11/05/20 1357  WBC 9.4 22.8* 16.5*  --   NEUTROABS 7.6  --   --   --   HGB 14.6 14.1 11.4* 11.3*  HCT 44.5 44.2 34.8* 33.8*  MCV 87.1 90.4 87.0  --   PLT 234 231 188  --    Basic Metabolic Panel: Recent Labs  Lab 11/03/20 1448 11/04/20 0607 11/05/20 0335  NA 138 138 135  K 3.9 5.0 4.2  CL 105 106 106  CO2 22 20* 23  GLUCOSE 154* 213* 213*  BUN 15 17 18   CREATININE 0.89 1.00 0.80  CALCIUM 10.2 8.8* 9.0  MG  --  1.8 2.0  PHOS  --  4.6 1.8*   GFR: CrCl cannot be calculated (Unknown ideal  weight.). Liver Function Tests: Recent Labs  Lab 11/03/20 1448  AST 18  ALT 13  ALKPHOS 58  BILITOT 1.1  PROT 7.6  ALBUMIN 4.4   Recent Labs  Lab 11/03/20 1448  LIPASE 29   No results for input(s): AMMONIA in the last 168 hours. Coagulation Profile: No results for input(s): INR, PROTIME in the last 168 hours. Cardiac Enzymes: No results for input(s): CKTOTAL, CKMB, CKMBINDEX, TROPONINI in the last 168 hours. BNP (last 3 results) No results for input(s): PROBNP in the last 8760 hours. HbA1C: No results for input(s): HGBA1C in the last 72 hours. CBG: Recent Labs  Lab 11/04/20 2131 11/05/20 0738 11/05/20 1117 11/05/20 1653 11/05/20 1936  GLUCAP 190* 190* 139* 161* 156*   Lipid Profile: No results for input(s): CHOL, HDL, LDLCALC, TRIG, CHOLHDL, LDLDIRECT in the  last 72 hours. Thyroid Function Tests: No results for input(s): TSH, T4TOTAL, FREET4, T3FREE, THYROIDAB in the last 72 hours. Anemia Panel: No results for input(s): VITAMINB12, FOLATE, FERRITIN, TIBC, IRON, RETICCTPCT in the last 72 hours. Urine analysis:    Component Value Date/Time   COLORURINE YELLOW 11/03/2020 1413   APPEARANCEUR HAZY (A) 11/03/2020 1413   LABSPEC 1.027 11/03/2020 1413   PHURINE 5.0 11/03/2020 1413   GLUCOSEU NEGATIVE 11/03/2020 1413   HGBUR MODERATE (A) 11/03/2020 1413   BILIRUBINUR NEGATIVE 11/03/2020 1413   KETONESUR 20 (A) 11/03/2020 1413   PROTEINUR 100 (A) 11/03/2020 1413   UROBILINOGEN 0.2 04/11/2007 1936   NITRITE NEGATIVE 11/03/2020 1413   LEUKOCYTESUR LARGE (A) 11/03/2020 1413   Sepsis Labs: @LABRCNTIP (procalcitonin:4,lacticidven:4)  ) Recent Results (from the past 240 hour(s))  Urine culture     Status: None   Collection Time: 11/03/20  4:04 PM   Specimen: Urine, Clean Catch  Result Value Ref Range Status   Specimen Description   Final    URINE, CLEAN CATCH Performed at [facility], [address], [place], [place] [address]    Special Requests   Final     NONE Performed at [facility], [address] [place], [place] [address]    Culture   Final    NO GROWTH Performed at [facility], [address] [place], [place] [address]    Report Status 11/05/2020 FINAL  Final  Resp Panel by RT-PCR (Flu A&B, Covid) Nasopharyngeal Swab     Status: None   Collection Time: 11/03/20  5:06 PM   Specimen: Nasopharyngeal Swab; Nasopharyngeal(NP) swabs in vial transport medium  Result Value Ref Range Status   SARS Coronavirus 2 by RT PCR NEGATIVE NEGATIVE Final    Comment: (NOTE) SARS-CoV-2 target nucleic acids are NOT DETECTED.  The SARS-CoV-2 RNA is generally detectable in upper respiratory specimens during the acute phase of infection. The lowest concentration of SARS-CoV-2 viral copies this assay can detect is 138 copies/mL. A negative result does not preclude SARS-Cov-2 infection and should not be used as the sole basis for treatment or other patient management decisions. A negative result may occur with  improper specimen collection/handling, submission of specimen other than nasopharyngeal swab, presence of viral mutation(s) within the areas targeted by this assay, and inadequate number of viral copies(<138 copies/mL). A negative result must be combined with clinical observations, patient history, and epidemiological information. The expected result is Negative.  Fact Sheet for Patients:  BloggerCourse.com  Fact Sheet for Healthcare Providers:  SeriousBroker.it  This test is no t yet approved or cleared by the [place] FDA and  has been authorized for detection and/or diagnosis of SARS-CoV-2 by FDA under an Emergency Use Authorization (EUA). This EUA will remain  in effect (meaning this test can be used) for the duration of the COVID-19 declaration under Section 564(b)(1) of the Act, 21 U.S.C.section 360bbb-3(b)(1), unless the authorization is terminated  or revoked sooner.        Influenza A by PCR NEGATIVE NEGATIVE Final   Influenza B by PCR NEGATIVE NEGATIVE Final    Comment: (NOTE) The Xpert Xpress SARS-CoV-2/FLU/RSV plus assay is intended as an aid in the diagnosis of influenza from Nasopharyngeal swab specimens and should not be used as a sole basis for treatment. Nasal washings and aspirates are unacceptable for Xpert Xpress SARS-CoV-2/FLU/RSV testing.  Fact Sheet for Patients: BloggerCourse.com  Fact Sheet for Healthcare Providers: SeriousBroker.it  This test is not yet approved or cleared by  the [employer] and has been authorized for detection and/or diagnosis of SARS-CoV-2 by FDA under an Emergency Use Authorization (EUA). This EUA will remain in effect (meaning this test can be used) for the duration of the COVID-19 declaration under Section 564(b)(1) of the Act, 21 U.S.C. section 360bbb-3(b)(1), unless the authorization is terminated or revoked.  Performed at [facility], [address] [place], [place] [address]   MRSA PCR Screening     Status: None   Collection Time: 11/03/20 11:31 PM   Specimen: Nasal Mucosa; Nasopharyngeal  Result Value Ref Range Status   MRSA by PCR NEGATIVE NEGATIVE Final    Comment:        The GeneXpert MRSA Assay (FDA approved for NASAL specimens only), is one component of a comprehensive MRSA colonization surveillance program. It is not intended to diagnose MRSA infection nor to guide or monitor treatment for MRSA infections. Performed at [facility], [address] [place], [place] [address]       Studies: No results found.  Scheduled Meds:  Chlorhexidine Gluconate Cloth  6 each Topical Daily   insulin aspart  0-5 Units Subcutaneous QHS   insulin aspart  0-9 Units Subcutaneous TID WC   [START ON 11/06/2020] methylPREDNISolone (SOLU-MEDROL) injection  40 mg Intravenous Q12H   metoprolol tartrate  5 mg Intravenous Q8H   pantoprazole  (PROTONIX) IV  40 mg Intravenous QHS    Continuous Infusions:  sodium chloride 75 mL/hr at 11/05/20 1033     LOS: 2 days     Myrtie Neither, MD [facility]  To reach me or the doctor on call, go to: [email or web address] Password [facility]  11/05/2020, 7:46 PM

## 2020-11-05 NOTE — Consult Note (Signed)
[employer], operational Provider: Code stroke Kaitlin [name], [place] attending) Consult Participants: Bedside nurse, patient, daughter(by phone) Location of the provider: [place], [place] Location of the patient: [facility]  This consult was provided via telemedicine with 2-way video and audio communication. The patient/family was informed that care would be provided in this way and agreed to receive care in this manner.    Chief Complaint: Left sided weakness  HPI: [name] with a history of metastatic lung cancer with metastasis to the brain who was in her normal state of health until 17:43 pm. At baseline, she uses a walker, but needs a little help with using it. She has undergone repeat radiation and is undergoing chemotherapy with osimertinib.  She was in her normal state of health 5:43 PM, and then around 6:50 PM she was discovered to have significant left-sided weakness and right-sided gaze.  A code stroke was activated and she was taken for head CT which reveals a small area of vasogenic edema in the right parietal lobe.  On my evaluation, she had significant left-sided weakness as noted below with right-sided gaze and left hemianopia consistent with a right MCA or ICA syndrome.  She was taken for an emergent CTA which revealed a right ICA occlusion with normal perfusion scan.  She does not have any intracranial occlusion.    LKW: 5:43 PM tpa given?: No, recent major surgery IR Thrombectomy? No, poor MR S as well as no intracranial occlusion Modified Rankin Scale: 4-Needs assistance to walk and tend to bodily needs Time of teleneurologist evaluation: 19:44  Exam: Vitals:   11/05/20 1700 11/05/20 1704  BP: (!) 193/88 (!) 193/88  Pulse: 88   Resp: 15   Temp:    SpO2: 97%     General: She is in bed with an NG tube in place, but in no apparent distress  1A: Level of Consciousness - 0 1B: Ask Month and Age - 2 1C: 'Blink Eyes' & 'Squeeze  Hands' - 0 2: Test Horizontal Extraocular Movements - 2 3: Test Visual Fields - 2 4: Test Facial Palsy - 2 5A: Test Left Arm Motor Drift - 1 5B: Test Right Arm Motor Drift - 0 6A: Test Left Leg Motor Drift - 2 6B: Test Right Leg Motor Drift - 1 7: Test Limb Ataxia - 0 8: Test Sensation - 2 9: Test Language/Aphasia- 0 10: Test Dysarthria - 1 11: Test Extinction/Inattention - 2 NIHSS score: 17   Imaging Reviewed: CT/CTA-no acute ischemic infarct but some vasogenic edema,  Labs reviewed in epic and pertinent values follow: Cr 0.8   Assessment: 70 year old female with right ICA occlusion with accompanying ICA clinical syndrome.  She has no intracranial occlusion, and is filling across her a comm.  Her CT perfusion scan suggests that she is getting at least some flow, but her clinical exam would suggest that its not completely adequate.  She is already slightly hypertensive and I would allow for permissive hypertension.  Given her premorbid functional status, her limited life expectancy as suggested by her leptomeningeal metastatic disease and the lack of intracranial occlusion or obvious perfusion deficit on CTP I do not think that she is a candidate for any type of acute revascularization therapy of the carotid artery.  It is possible that she will compensate, though based on her exam I suspect she will end up having a fairly significant infarct. I discussed her case with oncology, the covering hospitalist, and her family gathered for  a full discussion.   Unfortunately, there is no neurology over the weekend at [facility] and therefore it is not unreasonable to transfer for neurology follow-up to [facility].  She is currently being anticoagulated because of PE/DVT, but this was held due to recent drop in hemoglobin though it is still 11.4.  I discussed her status with the patient's family, who indicate that she has previously indicated she would not want to be kept alive on machines  and has a "DNR."  I confirmed that this would mean that if she were to have a cardiac arrest, we would not perform cardioversion, chest compressions or intubation and the family agrees that this reflects the wishes the patient has previously expressed.  To this end I have placed a DNR order, but full support short of cardiac arrest.  Recommendations:  1) permissive hypertension 2) lipids, a1c 3) H&H 4) echo 5) asa once stable 6) Stroke team will follow at [facility]   This patient is receiving care for possible acute neurological changes. There was 65 minutes of neurocritical care by this provider at the time of service, including time for direct evaluation via telemedicine, review of medical records, imaging studies and discussion of findings with providers, the patient and/or family.  Kaitlin Rack, MD [facility] [phone]  If 7pm- 7am, please page neurology on call as listed in [place].

## 2020-11-06 ENCOUNTER — Inpatient Hospital Stay (HOSPITAL_COMMUNITY): Payer: Medicare Other

## 2020-11-06 ENCOUNTER — Other Ambulatory Visit (HOSPITAL_COMMUNITY): Payer: Medicare Other

## 2020-11-06 DIAGNOSIS — R112 Nausea with vomiting, unspecified: Secondary | ICD-10-CM

## 2020-11-06 DIAGNOSIS — I824Y9 Acute embolism and thrombosis of unspecified deep veins of unspecified proximal lower extremity: Secondary | ICD-10-CM

## 2020-11-06 DIAGNOSIS — I63031 Cerebral infarction due to thrombosis of right carotid artery: Secondary | ICD-10-CM

## 2020-11-06 DIAGNOSIS — I6521 Occlusion and stenosis of right carotid artery: Secondary | ICD-10-CM | POA: Clinically undetermined

## 2020-11-06 DIAGNOSIS — K55029 Acute infarction of small intestine, extent unspecified: Secondary | ICD-10-CM

## 2020-11-06 DIAGNOSIS — C7931 Secondary malignant neoplasm of brain: Secondary | ICD-10-CM

## 2020-11-06 DIAGNOSIS — E119 Type 2 diabetes mellitus without complications: Secondary | ICD-10-CM

## 2020-11-06 DIAGNOSIS — I161 Hypertensive emergency: Secondary | ICD-10-CM

## 2020-11-06 DIAGNOSIS — G9341 Metabolic encephalopathy: Secondary | ICD-10-CM

## 2020-11-06 LAB — BASIC METABOLIC PANEL
Anion gap: 9 (ref 5–15)
BUN: 21 mg/dL (ref 8–23)
CO2: 23 mmol/L (ref 22–32)
Calcium: 9.2 mg/dL (ref 8.9–10.3)
Chloride: 103 mmol/L (ref 98–111)
Creatinine, Ser: 0.8 mg/dL (ref 0.44–1.00)
GFR, Estimated: >60 mL/min (ref >60)
Glucose, Bld: 142 mg/dL — ABNORMAL HIGH (ref 70–99)
Potassium: 3.8 mmol/L (ref 3.5–5.1)
Sodium: 135 mmol/L (ref 135–145)

## 2020-11-06 LAB — COMPREHENSIVE METABOLIC PANEL
ALT: 18 U/L (ref 0–44)
AST: 24 U/L (ref 15–41)
Albumin: 2.6 g/dL — ABNORMAL LOW (ref 3.5–5.0)
Alkaline Phosphatase: 37 U/L — ABNORMAL LOW (ref 38–126)
Anion gap: 6 (ref 5–15)
BUN: 20 mg/dL (ref 8–23)
CO2: 24 mmol/L (ref 22–32)
Calcium: 9.2 mg/dL (ref 8.9–10.3)
Chloride: 107 mmol/L (ref 98–111)
Creatinine, Ser: 0.83 mg/dL (ref 0.44–1.00)
GFR, Estimated: >60 mL/min (ref >60)
Glucose, Bld: 173 mg/dL — ABNORMAL HIGH (ref 70–99)
Potassium: 3.8 mmol/L (ref 3.5–5.1)
Sodium: 137 mmol/L (ref 135–145)
Total Bilirubin: 0.7 mg/dL (ref 0.3–1.2)
Total Protein: 5.5 g/dL — ABNORMAL LOW (ref 6.5–8.1)

## 2020-11-06 LAB — CBC WITH DIFFERENTIAL/PLATELET
Abs Immature Granulocytes: 0.13 10*3/uL — ABNORMAL HIGH (ref 0.00–0.07)
Abs Immature Granulocytes: 0.1 10*3/uL — ABNORMAL HIGH (ref 0.00–0.07)
Basophils Absolute: 0 10*3/uL (ref 0.0–0.1)
Basophils Absolute: 0 10*3/uL (ref 0.0–0.1)
Basophils Relative: 0 %
Basophils Relative: 0 %
Eosinophils Absolute: 0 10*3/uL (ref 0.0–0.5)
Eosinophils Absolute: 0 10*3/uL (ref 0.0–0.5)
Eosinophils Relative: 0 %
Eosinophils Relative: 0 %
HCT: 33.1 % — ABNORMAL LOW (ref 36.0–46.0)
HCT: 37 % (ref 36.0–46.0)
Hemoglobin: 11.1 g/dL — ABNORMAL LOW (ref 12.0–15.0)
Hemoglobin: 12.4 g/dL (ref 12.0–15.0)
Immature Granulocytes: 1 %
Immature Granulocytes: 1 %
Lymphocytes Relative: 4 %
Lymphocytes Relative: 3 %
Lymphs Abs: 0.6 10*3/uL — ABNORMAL LOW (ref 0.7–4.0)
Lymphs Abs: 0.4 10*3/uL — ABNORMAL LOW (ref 0.7–4.0)
MCH: 28.4 pg (ref 26.0–34.0)
MCH: 28.5 pg (ref 26.0–34.0)
MCHC: 33.5 g/dL (ref 30.0–36.0)
MCHC: 33.5 g/dL (ref 30.0–36.0)
MCV: 84.7 fL (ref 80.0–100.0)
MCV: 85.1 fL (ref 80.0–100.0)
Monocytes Absolute: 0.9 10*3/uL (ref 0.1–1.0)
Monocytes Absolute: 0.9 10*3/uL (ref 0.1–1.0)
Monocytes Relative: 7 %
Monocytes Relative: 7 %
Neutro Abs: 11.7 10*3/uL — ABNORMAL HIGH (ref 1.7–7.7)
Neutro Abs: 11.2 10*3/uL — ABNORMAL HIGH (ref 1.7–7.7)
Neutrophils Relative %: 88 %
Neutrophils Relative %: 89 %
Platelets: 201 10*3/uL (ref 150–400)
Platelets: 216 10*3/uL (ref 150–400)
RBC: 3.91 MIL/uL (ref 3.87–5.11)
RBC: 4.35 MIL/uL (ref 3.87–5.11)
RDW: 15.9 % — ABNORMAL HIGH (ref 11.5–15.5)
RDW: 16 % — ABNORMAL HIGH (ref 11.5–15.5)
WBC: 13.3 10*3/uL — ABNORMAL HIGH (ref 4.0–10.5)
WBC: 12.6 10*3/uL — ABNORMAL HIGH (ref 4.0–10.5)
nRBC: 0 % (ref 0.0–0.2)
nRBC: 0 % (ref 0.0–0.2)

## 2020-11-06 LAB — LACTIC ACID, PLASMA: Lactic Acid, Venous: 1.2 mmol/L (ref 0.5–1.9)

## 2020-11-06 LAB — PHOSPHORUS: Phosphorus: 2.3 mg/dL — ABNORMAL LOW (ref 2.5–4.6)

## 2020-11-06 LAB — GLUCOSE, CAPILLARY
Glucose-Capillary: 188 mg/dL — ABNORMAL HIGH (ref 70–99)
Glucose-Capillary: 179 mg/dL — ABNORMAL HIGH (ref 70–99)
Glucose-Capillary: 156 mg/dL — ABNORMAL HIGH (ref 70–99)
Glucose-Capillary: 149 mg/dL — ABNORMAL HIGH (ref 70–99)

## 2020-11-06 LAB — LIPID PANEL
Cholesterol: 139 mg/dL (ref 0–200)
HDL: 59 mg/dL (ref >40)
LDL Cholesterol: 66 mg/dL (ref 0–99)
Total CHOL/HDL Ratio: 2.4 RATIO
Triglycerides: 72 mg/dL (ref <150)
VLDL: 14 mg/dL (ref 0–40)

## 2020-11-06 LAB — SODIUM: Sodium: 135 mmol/L (ref 135–145)

## 2020-11-06 LAB — MAGNESIUM: Magnesium: 2.2 mg/dL (ref 1.7–2.4)

## 2020-11-06 LAB — TSH: TSH: 0.718 u[IU]/mL (ref 0.350–4.500)

## 2020-11-06 LAB — AMMONIA: Ammonia: 27 umol/L (ref 9–35)

## 2020-11-06 MED ORDER — LEVETIRACETAM IN NACL 1000 MG/100ML IV SOLN
1000.0000 mg | Freq: Once | INTRAVENOUS | Status: AC
  Administered 2020-11-06: 1000 mg via INTRAVENOUS
  Filled 2020-11-06: qty 100

## 2020-11-06 MED ORDER — GADOBUTROL 1 MMOL/ML IV SOLN
10.0000 mL | Freq: Once | INTRAVENOUS | Status: AC | PRN
  Administered 2020-11-06: 8.5 mL via INTRAVENOUS

## 2020-11-06 MED ORDER — SODIUM CHLORIDE 0.9 % IV SOLN
2000.0000 mg | Freq: Two times a day (BID) | INTRAVENOUS | Status: DC
  Filled 2020-11-06 (×2): qty 20

## 2020-11-06 MED ORDER — SODIUM CHLORIDE 3 % IV SOLN
INTRAVENOUS | Status: DC
  Filled 2020-11-06: qty 1000

## 2020-11-06 MED ORDER — HEPARIN SODIUM (PORCINE) 5000 UNIT/ML IJ SOLN
5000.0000 [IU] | Freq: Three times a day (TID) | INTRAMUSCULAR | Status: DC
  Administered 2020-11-06: 5000 [IU] via SUBCUTANEOUS
  Filled 2020-11-06 (×2): qty 1

## 2020-11-06 MED ORDER — ORAL CARE MOUTH RINSE
15.0000 mL | Freq: Two times a day (BID) | OROMUCOSAL | Status: DC
  Administered 2020-11-07 – 2020-11-08 (×4): 15 mL via OROMUCOSAL

## 2020-11-06 MED ORDER — MANNITOL 20 % IV SOLN
25.0000 g | Freq: Once | Status: DC
  Filled 2020-11-06: qty 125

## 2020-11-06 MED ORDER — CHLORHEXIDINE GLUCONATE 0.12 % MT SOLN
15.0000 mL | Freq: Two times a day (BID) | OROMUCOSAL | Status: DC
  Administered 2020-11-06 – 2020-11-08 (×4): 15 mL via OROMUCOSAL
  Filled 2020-11-06 (×2): qty 15

## 2020-11-06 MED ORDER — MANNITOL 25 % IV SOLN: 25.0000 g | Freq: Once | Status: DC

## 2020-11-06 MED ORDER — NICARDIPINE HCL IN NACL 20-0.86 MG/200ML-% IV SOLN
3.0000 mg/h | INTRAVENOUS | Status: DC
  Administered 2020-11-06: 5 mg/h via INTRAVENOUS
  Filled 2020-11-06: qty 400

## 2020-11-06 MED ORDER — MANNITOL 25 % IV SOLN
25.0000 g | Freq: Once | Status: AC
  Administered 2020-11-06: 25 g via INTRAVENOUS
  Filled 2020-11-06 (×2): qty 100

## 2020-11-06 MED ORDER — SODIUM CHLORIDE 3 % IV BOLUS
125.0000 mL | Freq: Once | INTRAVENOUS | Status: AC
  Administered 2020-11-06: 125 mL via INTRAVENOUS
  Filled 2020-11-06: qty 500

## 2020-11-06 MED ORDER — LEVETIRACETAM IN NACL 1000 MG/100ML IV SOLN
1000.0000 mg | Freq: Two times a day (BID) | INTRAVENOUS | Status: DC
  Administered 2020-11-07 – 2020-11-08 (×4): 1000 mg via INTRAVENOUS
  Filled 2020-11-06 (×4): qty 100

## 2020-11-06 MED ORDER — LEVETIRACETAM IN NACL 1000 MG/100ML IV SOLN
1000.0000 mg | Freq: Once | INTRAVENOUS | Status: AC
  Filled 2020-11-06: qty 100

## 2020-11-06 MED ORDER — SODIUM CHLORIDE 0.9 % IV SOLN
2000.0000 mg | Freq: Once | INTRAVENOUS | Status: DC
  Filled 2020-11-06: qty 20

## 2020-11-06 NOTE — Progress Notes (Signed)
Patient alert but more confused. Oriented to self. NGT clamped for transport. [facility] transported patient via stretcher. Belongings sent with patient. NIH 17 and patient exhibits no signs of distress, pain, or verbalizes complaints at this time. Patient's daughter aware of new room at [facility] and given nurse information and unit telephone number.

## 2020-11-06 NOTE — Progress Notes (Signed)
PT Cancellation Note  Patient Details Name: CHARLES NIESE MRN: [id number] DOB: 02-Feb-1951   Cancelled Treatment:    Reason Eval/Treat Not Completed: Medical issues which prohibited therapy  Noted decline in Neuro status, and pt going to MRI soon;  Discussed with RN and MDs, who advise we hold at this time;   Will check back for PT eval depending on pt status and Goals of Care;   Roney Marion, [place]  Acute Rehabilitation Services Pager [phone] Office [phone]    Colletta [place] 11/06/2020, 2:02 PM

## 2020-11-06 NOTE — Progress Notes (Signed)
PROGRESS NOTE    Kaitlin [name]  BMW:[id number] DOB: [birth year] DOA: 11/03/2020 PCP: [name], MD    Chief Complaint  Patient presents with   Abdominal Pain    Brief Narrative:   70 year old lady with prior h/o DM, DVT, hypertension, metastatic lung ca with mets to brain, presents to [facility] with nausea, vomiting and abdominal pain. She was found to have a closed loop obstruction of the small bowel in the pelvis concerning for impending ischemia. She was admitted for GS , underwent exp lap and bowel resection. Hospitalist service consulted for medical management for hypertension, COPD and chronic respiratory failure .  On the evening of 6/11  code stroke was called for change in mental status and new left sided deficits,. CTA and CT head without contrast showed new right ICA occlusion and vasogenic edema from brain mets.  She was transferred to [facility] for further evaluation by neurology.  On exam this morning , pt is  minimally responsive to verbal cues and grimaces to sternal rub.    Assessment & Plan:   Active Problems:   Pulmonary emboli (HCC)   Hypertension   Diabetes mellitus without complication (HCC)   DVT (deep venous thrombosis) (HCC)   Brain metastases (HCC)   Nausea and vomiting   Protein-calorie malnutrition, severe   Ischemic necrosis of small bowel (HCC)   Acute metabolic encephalopathy   ICAO (internal carotid artery occlusion), right   Ischemic necrosis of the small bowel: S/p Exploratory laparotomy, partial small bowel resection 6/9 Dr. Lovell Sheehan.  Currently has a NG tube connected to suction.  She is NPO and failed speech and swallow evaluation this morning due to AMS.  On IV PPI.    Acute encephalopathy from acute CVA: CTA of the head and neck showed new right ICA occlusion. Neurology consulted.  Echocardiogram ordered and pending.  LDL is 66, pt is on lipitor 20 mg daily at home.  Hemoglobin A1c is pending.  All meds are on hold for NPO.     H/o of DVT/PE:   Pt was on therapeutic dose of Lovenox at home .  Lovenox  has been on hold post op. Recommend restarting Lovenox after discussing with neurology.      Chronic respiratory failure with hypoxia - s/p intubation post surgery, with successful extubation.  - pt currently on RA with sats >92%    Metastatic Lung cancer s/p mets to brain:  Outpatient follow up with oncology.    Chronic steroid use:  Pt was on prednisone prior to admission, was started on stress dose steroids, post op and is currently being weaned off.  Pt currently on solumedrol 40 mg Q12HRS.    Leukocytosis :  ? Steroids.    Protein calorie Malnutrition:  Dietary consulted.   Hypertension;  Permissive hypertension for 48 hours.     Type 2 DM/ Steroid induced DM;  CBG (last 3)  Recent Labs    11/05/20 2053 11/06/20 0850 11/06/20 1210  GLUCAP 136* 188* 179*   Continue with SSI.  Hemoglobin A1c is pending.  Non insulin dependent.    Acute anemia of blood loss probably from surgery:  Baseline hemoglobin around 14 dropped to 11 yesterday.  Recheck levels today.     DVT prophylaxis: (Lovenox) on hold Code Status: DNR Family Communication: DAUGHTER AT BEDSIDE.  Disposition:   Status is: Inpatient  Remains inpatient appropriate because:Altered mental status, Ongoing diagnostic testing needed not appropriate for outpatient work up, Unsafe d/c plan, IV treatments appropriate  due to intensity of illness or inability to take PO, and Inpatient level of care appropriate due to severity of illness  Dispo: The patient is from: Home              Anticipated d/c is to:  PENDING              Patient currently is not medically stable to d/c.   Difficult to place patient No       Consultants:  NEUROLOGY SURGERY PALLIATIVE CARE  Procedures:  S/p Exploratory laparotomy, partial small bowel resection 6/9 Dr. Lovell Sheehan  Antimicrobials:  Antibiotics Given (last 72 hours)     Date/Time Action  Medication Dose Rate   11/03/20 1741 New Bag/Given   cefTRIAXone (ROCEPHIN) 1 g in sodium chloride 0.9 % 100 mL IVPB 1 g 200 mL/hr         Subjective: NON VERBAL. Appears comfortable, has NG tube connected to suction.   Objective: Vitals:   11/06/20 0119 11/06/20 0400 11/06/20 0700 11/06/20 1211  BP: (!) 179/96  (!) 181/95 (!) 170/93  Pulse: (!) 114  (!) 103 97  Resp: 15  20 19   Temp: 98.6 F (37 C) 98.9 F (37.2 C) 98.3 F (36.8 C) 97.9 F (36.6 C)  TempSrc: Oral Oral Oral Oral  SpO2: 98%  98% 96%  Weight: 88.6 kg     Height:        Intake/Output Summary (Last 24 hours) at 11/06/2020 1318 Last data filed at 11/06/2020 0119 Gross per 24 hour  Intake 379.53 ml  Output 2400 ml  Net -2020.47 ml   Filed Weights   11/05/20 2030 11/06/20 0119  Weight: 88.4 kg 88.6 kg    Examination:  General exam: WELL developed lady, not in distress, has NG TUBE connected to suction.  Respiratory system: diminished air entry at bases, no wheezing heard.  Cardiovascular system: S1 & S2 heard, tachycardic, no JVD. No pedal edema. Gastrointestinal system: Abdomen is soft, mildly distended, bowel sounds minimal.  Central nervous system: minimally responsive,  Extremities: No cyanosis or clubbing.  Skin: No rashes seen.  Psychiatry: cannot be assessed.     Data Reviewed: I have personally reviewed following labs and imaging studies  CBC: Recent Labs  Lab 11/03/20 1448 11/04/20 0607 11/05/20 0451 11/05/20 1357 11/05/20 2233  WBC 9.4 22.8* 16.5*  --  17.0*  NEUTROABS 7.6  --   --   --   --   HGB 14.6 14.1 11.4* 11.3* 11.1*  HCT 44.5 44.2 34.8* 33.8* 32.9*  MCV 87.1 90.4 87.0  --  85.9  PLT 234 231 188  --  207    Basic Metabolic Panel: Recent Labs  Lab 11/03/20 1448 11/04/20 0607 11/05/20 0335 11/05/20 2233  NA 138 138 135 134*  K 3.9 5.0 4.2 3.9  CL 105 106 106 104  CO2 22 20* 23 23  GLUCOSE 154* 213* 213* 157*  BUN 15 17 18 19   CREATININE 0.89 1.00 0.80 0.78   CALCIUM 10.2 8.8* 9.0 9.2  MG  --  1.8 2.0  --   PHOS  --  4.6 1.8*  --     GFR: Estimated Creatinine Clearance: 74.8 mL/min (by C-G formula based on SCr of 0.78 mg/dL).  Liver Function Tests: Recent Labs  Lab 11/03/20 1448 11/05/20 2233  AST 18 24  ALT 13 19  ALKPHOS 58 37*  BILITOT 1.1 0.6  PROT 7.6 6.2*  ALBUMIN 4.4 3.0*    CBG: Recent Labs  Lab 11/05/20 1653 11/05/20 1936 11/05/20 2053 11/06/20 0850 11/06/20 1210  GLUCAP 161* 156* 136* 188* 179*     Recent Results (from the past 240 hour(s))  Urine culture     Status: None   Collection Time: 11/03/20  4:04 PM   Specimen: Urine, Clean Catch  Result Value Ref Range Status   Specimen Description   Final    URINE, CLEAN CATCH Performed at [facility], [address], [place], [place] [address]    Special Requests   Final    NONE Performed at [facility], [address] [place], [place] [address]    Culture   Final    NO GROWTH Performed at [facility], [address] [place], [place] [address]    Report Status 11/05/2020 FINAL  Final  Resp Panel by RT-PCR (Flu A&B, Covid) Nasopharyngeal Swab     Status: None   Collection Time: 11/03/20  5:06 PM   Specimen: Nasopharyngeal Swab; Nasopharyngeal(NP) swabs in vial transport medium  Result Value Ref Range Status   SARS Coronavirus 2 by RT PCR NEGATIVE NEGATIVE Final    Comment: (NOTE) SARS-CoV-2 target nucleic acids are NOT DETECTED.  The SARS-CoV-2 RNA is generally detectable in upper respiratory specimens during the acute phase of infection. The lowest concentration of SARS-CoV-2 viral copies this assay can detect is 138 copies/mL. A negative result does not preclude SARS-Cov-2 infection and should not be used as the sole basis for treatment or other patient management decisions. A negative result may occur with  improper specimen collection/handling, submission of specimen other than nasopharyngeal swab, presence of viral mutation(s) within  the areas targeted by this assay, and inadequate number of viral copies(<138 copies/mL). A negative result must be combined with clinical observations, patient history, and epidemiological information. The expected result is Negative.  Fact Sheet for Patients:  BloggerCourse.com  Fact Sheet for Healthcare Providers:  SeriousBroker.it  This test is no t yet approved or cleared by the [place] FDA and  has been authorized for detection and/or diagnosis of SARS-CoV-2 by FDA under an Emergency Use Authorization (EUA). This EUA will remain  in effect (meaning this test can be used) for the duration of the COVID-19 declaration under Section 564(b)(1) of the Act, 21 U.S.C.section 360bbb-3(b)(1), unless the authorization is terminated  or revoked sooner.       Influenza A by PCR NEGATIVE NEGATIVE Final   Influenza B by PCR NEGATIVE NEGATIVE Final    Comment: (NOTE) The Xpert Xpress SARS-CoV-2/FLU/RSV plus assay is intended as an aid in the diagnosis of influenza from Nasopharyngeal swab specimens and should not be used as a sole basis for treatment. Nasal washings and aspirates are unacceptable for Xpert Xpress SARS-CoV-2/FLU/RSV testing.  Fact Sheet for Patients: BloggerCourse.com  Fact Sheet for Healthcare Providers: SeriousBroker.it  This test is not yet approved or cleared by the [place] FDA and has been authorized for detection and/or diagnosis of SARS-CoV-2 by FDA under an Emergency Use Authorization (EUA). This EUA will remain in effect (meaning this test can be used) for the duration of the COVID-19 declaration under Section 564(b)(1) of the Act, 21 U.S.C. section 360bbb-3(b)(1), unless the authorization is terminated or revoked.  Performed at [facility], [address], [place], [place] [address]   MRSA PCR Screening     Status: None   Collection Time:  11/03/20 11:31 PM   Specimen: Nasal Mucosa; Nasopharyngeal  Result Value Ref Range Status   MRSA by PCR NEGATIVE NEGATIVE  Final    Comment:        The GeneXpert MRSA Assay (FDA approved for NASAL specimens only), is one component of a comprehensive MRSA colonization surveillance program. It is not intended to diagnose MRSA infection nor to guide or monitor treatment for MRSA infections. Performed at [facility], [address] [place], [place] [address]          Radiology Studies: CT HEAD CODE STROKE WO CONTRAST  Result Date: 11/05/2020 CLINICAL DATA:  Code stroke.  Initial evaluation for acute stroke. EXAM: CT HEAD WITHOUT CONTRAST TECHNIQUE: Contiguous axial images were obtained from the base of the skull through the vertex without intravenous contrast. COMPARISON:  Previous MRI from 09/30/2020. FINDINGS: Brain: Generalized cerebral atrophy with chronic small vessel ischemic disease. No acute intracranial hemorrhage. No acute large vessel territory infarct by CT. Few punctate calcifications involving the left frontal region and left cerebellum again seen, likely reflecting treated metastases, also seen on prior CT. Small area of vasogenic edema at the right parietal region also likely related to metastatic disease, also seen on prior MRI, similar to perhaps mildly progressed (series 3, image 29). No other visible new mass or metastatic disease by CT. No significant mass effect or midline shift. No hydrocephalus or extra-axial fluid collection. Vascular: No hyperdense vessel. Calcified atherosclerosis present at skull base. Skull: Scalp soft tissues demonstrate no acute finding. Calvarium intact. Sinuses/Orbits: Globes and orbital soft tissues demonstrate no acute finding. Nasogastric tube in place. Sequelae of prior sinus surgery with underlying chronic mucosal thickening. Small bilateral mastoid effusions. Other: None. ASPECTS [facility] Stroke Program Early CT Score) - Ganglionic level  infarction (caudate, lentiform nuclei, internal capsule, insula, M1-M3 cortex): 7 - Supraganglionic infarction (M4-M6 cortex): 3 Total score (0-10 with 10 being normal): 10 IMPRESSION: 1. No acute intracranial infarct or other abnormality. 2. ASPECTS is 10. 3. Small area of vasogenic edema involving the right parietal lobe, likely related to metastatic disease, also seen on prior brain MRI from 09/30/2020. Overall, appearance is similar to perhaps mildly progressed from prior. Additional punctate calcifications at the left frontal lobe and left cerebellum likely reflect treated disease, stable. 4. Underlying atrophy with chronic small vessel ischemic disease. Critical Value/emergent results were called by telephone at the time of interpretation on 11/05/2020 at 7:35 pm to provider [facility] , who verbally acknowledged these results. Electronically Signed   By: Rise Mu M.D.   On: 11/05/2020 19:47   CT ANGIO HEAD NECK W WO CM W PERF (CODE STROKE)  Result Date: 11/05/2020 CLINICAL DATA:  Initial evaluation for slurred speech, right-sided facial droop. EXAM: CT ANGIOGRAPHY HEAD AND NECK TECHNIQUE: Multidetector CT imaging of the head and neck was performed using the standard protocol during bolus administration of intravenous contrast. Multiplanar CT image reconstructions and MIPs were obtained to evaluate the vascular anatomy. Carotid stenosis measurements (when applicable) are obtained utilizing NASCET criteria, using the distal internal carotid diameter as the denominator. CONTRAST:  OMNIPAQUE IOHEXOL 350 MG/ML SOLN COMPARISON:  Prior head CT from earlier the same day. FINDINGS: CTA NECK FINDINGS Aortic arch: Visualized aortic arch normal in caliber with normal 3 vessel morphology. No hemodynamically significant stenosis seen about the origin of the great vessels. Right carotid system: Right CCA patent from its origin to the bifurcation without stenosis. Atheromatous change about the right  carotid bulb/proximal right ICA. There is occlusion of the right ICA just distal to the bifurcation (series 6, image 133). While this is age indeterminate in appearance by this  exam, this appears to be new as compared to previous MRI from 09/30/2020, and is suspected to be acute. Right ICA remains occluded distally to the skull base. Left carotid system: Left CCA patent from its origin to the bifurcation without stenosis. Eccentric calcified plaque about the left carotid bulb with up to approximately 50% stenosis by NASCET criteria. Left ICA patent distally without stenosis, dissection or occlusion. Vertebral arteries: Both vertebral arteries arise from subclavian arteries. No proximal subclavian artery stenosis. Atheromatous plaque at the origins of both vertebral arteries with moderate to severe stenoses bilaterally, right worse than left. Vertebral arteries patent distally within the neck without stenosis, dissection or occlusion. Skeleton: No visible acute osseous finding. No discrete or worrisome osseous lesions. Moderate spondylosis present at C3-4 through C6-7 without high-grade spinal stenosis. Other neck: 9 mm nodule present at the thyroid isthmus, felt to be of doubtful significance given size and patient age, no follow-up imaging recommended (ref: J Am Coll Radiol. 2015 Feb;12(2): 143-50).No other acute soft tissue abnormality within the neck. No other mass or adenopathy. Nasogastric tube in place. Upper chest: Bandlike densities with volume loss visualized upper chest demonstrates no other acute finding. Emanating from the bilateral hilar region/paramediastinal regions again seen, similar to previous PET-CT, and likely reflecting post treatment changes. Minimal subpleural reticular densities at the posterior right upper lobe likely reflect post treatment fibrosis and/or scarring, also stable. Mild soft tissue fullness about the bilateral hila without frank adenopathy also similar. Visualized upper chest  demonstrates no other acute finding. Review of the MIP images confirms the above findings CTA HEAD FINDINGS Anterior circulation: Petrous left ICA widely patent. Scattered atheromatous plaque within the left carotid siphon with associated mild to moderate multifocal narrowing. Right ICA remains occluded through the siphon, with distal reconstitution at the terminus related to collateral flow across the circle-of-Willis. Left A1 segment widely patent. Short-segment moderate proximal right A1 stenosis (series 10, image 22). Right A1 partially fenestrated distally. Normal anterior communicating artery complex. Both ACAs patent to their distal aspects without stenosis. Right M1 segment widely patent. Focal moderate stenosis at the distal left M1 segment/MCA bifurcation (series 9, image 19). Distal MCA branches well perfused without proximal branch occlusion. Posterior circulation: Atheromatous change within the proximal V4 segments without significant stenosis. Right PICA not well seen. Focal irregularity at the origin of the left PICA of favored to be related to focal vascular tortuosity rather than aneurysm (series 6, image 144). Basilar patent to its distal aspect without stenosis. Superior cerebellar arteries patent bilaterally. Both PCAs primarily supplied via the basilar well perfused to their distal aspects without stenosis. Venous sinuses: Grossly patent allowing for timing the contrast bolus. Anatomic variants: None significant. Review of the MIP images confirms the above findings IMPRESSION: 1. Occlusion of the right ICA just distal to the bifurcation, new as compared to previous MRI from 09/30/2020, and likely acute in nature. Right ICA remains occluded through the siphon, with distal reconstitution at the terminus related to collateral flow across the circle-of-Willis. Right MCA perfused without downstream occlusion. 2. Atheromatous plaque about the left carotid bulb with up to 50% stenosis by NASCET  criteria. 3. Moderate to severe stenoses at the origins of both vertebral arteries, right worse than left. 4. Intracranial atherosclerotic disease including short-segment moderate right A1 and distal left M1 stenoses as above. 5. Stable post treatment changes within the visualized lungs. These results were communicated to Dr. Amada Jupiter at 8:52 pmon 6/11/2022by text page via the [facility] messaging system. Electronically Signed  By: Rise Mu M.D.   On: 11/05/2020 21:11        Scheduled Meds:  Chlorhexidine Gluconate Cloth  6 each Topical Daily   insulin aspart  0-5 Units Subcutaneous QHS   insulin aspart  0-9 Units Subcutaneous TID WC   methylPREDNISolone (SOLU-MEDROL) injection  40 mg Intravenous Q12H   metoprolol tartrate  5 mg Intravenous Q8H   pantoprazole (PROTONIX) IV  40 mg Intravenous QHS   Continuous Infusions:  sodium chloride 75 mL/hr at 11/06/20 0000     LOS: 3 days    Time spent: 44 minutes.     Kathlen Mody, MD [facility]   To contact the attending provider between 7A-7P or the covering provider during after hours 7P-7A, please log into the web site [email or web address] and access using universal [place] password for that web site. If you do not have the password, please call the hospital operator.  11/06/2020, 1:18 PM

## 2020-11-06 NOTE — Consult Note (Signed)
NAME:  Kaitlin [name], MRN:  [id number], DOB:  Aug 21, 1950, LOS: 3 ADMISSION DATE:  11/03/2020, CONSULTATION DATE:  11/06/2020 REFERRING MD:  TRH, CHIEF COMPLAINT:  stroke with encephalopathy   History of Present Illness:  Kaitlin [name] is a 70 year old woman with history of metastatic lung cancer with mets to the brain who presented with ischemic bowel on 11/03/2020 to [facility].  She additionally has DVT in the setting of malignancy and is on Lovenox.  She was getting radiation therapy to her brain, last in March 2022.  She takes steroids daily for this.  She was taken emergently to the OR on 6/9 small bowel resection.  She was extubated after surgery.  She had some postop ileus but this was resolving.  She had a drop in her hemoglobin so her Lovenox was not immediately resumed postoperatively.  6/11 she had acute onset mental status changes and was evaluated by telestroke and was found to have a right MCA, ICA occlusion.  She was not a candidate for any kind of thrombectomy or revascularization therapy due to her limited life expectancy and metastatic cancer.  She was transferred to [facility] for further management.  This morning she had worsening mental status changes increasing somnolence.  Repeat head CT shows worsening vasogenic cerebral edema.  She was transferred to the ICU for hypertonic saline, mannitol, and evolving stroke.  She is currently not responsive and not following commands.  Pertinent  Medical History  Metastatic lung cancer with mets to the brain, status post radiation therapy to the brain on steroids Recent small bowel obstruction for ischemic bowel Hypertension   Significant Hospital Events: Including procedures, antibiotic start and stop dates in addition to other pertinent events   6/09 admitted to [facility] for small bowel obstruction secondary to ischemic bowel, taken the OR for ex lap and lysis of adhesions and small bowel resection 6/11 stroke alert called, right  internal carotid artery occlusion, transferred to [facility] 6/12 worsening mental status changes and repeat scan shows cerebral edema  Interim History / Subjective:    Objective   Blood pressure (!) 157/91, pulse 97, temperature 97.9 F (36.6 C), temperature source Oral, resp. rate 15, height 5\' 7"  (1.702 m), weight 88.6 kg, SpO2 96 %.        Intake/Output Summary (Last 24 hours) at 11/06/2020 1837 Last data filed at 11/06/2020 0119 Gross per 24 hour  Intake 379.53 ml  Output 1400 ml  Net -1020.47 ml   Filed Weights   11/05/20 2030 11/06/20 0119  Weight: 88.4 kg 88.6 kg    Examination: General: Chronic ill-appearing, no distress HENT: Mucous membranes moist Lungs: No increased work of breathing, no wheezes or crackles, clear to auscultation bilaterally Cardiovascular: Tachycardic, regular Abdomen: Distended, soft, surgical incisions clean dry intact Extremities: Thin, no edema Neuro: Not responsive, not following commands GU: No Foley  Labs/imaging that I havepersonally reviewed  (right click and "Reselect all SmartList Selections" daily)  I personally reviewed her MRI of brain which shows large acute right MCA territory infarct without hemorrhagic transformation  Resolved Hospital Problem list   Small bowel obstruction  Assessment & Plan:  Kaitlin [name] is an unfortunate 70 year old woman with history of metastatic lung cancer to the brain who is now here with acute right internal carotid artery occlusion  Acute metabolic encephalopathy Right internal carotid artery infarct with worsening encephalopathy Metastatic lung cancer with brain metastases DVT/PE in the setting of malignancy Postop day 3 small bowel resection  for bowel obstruction  She was transferred to the ICU for hypertonic saline and mannitol which have been ordered.  Keep head of the bed elevated.  Continue Lovenox.  Personally with the neurology attending.  She needs a goal blood pressure of 140  and 160 and will start in nicardipine for that.  I call the patient's daughter [name] who lives in [place] about 45 minutes away from here.  We discussed the gravity of the situation and she understands that this is a potentially life limiting stroke.  We discussed that these medications are sort of [facility] and we hope that they work.  In the event that she suffers a mental status decline further than this and she does not respond to these medications my medical recommendation would be to make her comfortable and let her die natural death.  This would include not intubating or not doing any chest compressions.  She is in agreement with this.  I let her know that strokes involving can change from minute to minute an hour to hour and she may receive a call in the middle of the night if she does worsen.   Best practice (right click and "Reselect all SmartList Selections" daily)  Diet:  NPO Pain/Anxiety/Delirium protocol (if indicated): No VAP protocol (if indicated): Not indicated DVT prophylaxis: LMWH GI prophylaxis: N/A Glucose control:  SSI No Central venous access:  N/A Arterial line:  N/A Foley:  N/A Mobility:  bed rest  PT consulted: n/a Last date of multidisciplinary goals of care discussion [see above] Code Status:  DNR Disposition: ICU  Labs   CBC: Recent Labs  Lab 11/03/20 1448 11/04/20 0607 11/05/20 0451 11/05/20 1357 11/05/20 2233 11/06/20 1340  WBC 9.4 22.8* 16.5*  --  17.0* 13.3*  NEUTROABS 7.6  --   --   --   --  11.7*  HGB 14.6 14.1 11.4* 11.3* 11.1* 11.1*  HCT 44.5 44.2 34.8* 33.8* 32.9* 33.1*  MCV 87.1 90.4 87.0  --  85.9 84.7  PLT 234 231 188  --  207 201    Basic Metabolic Panel: Recent Labs  Lab 11/03/20 1448 11/04/20 0607 11/05/20 0335 11/05/20 2233 11/06/20 1340  NA 138 138 135 134* 137  K 3.9 5.0 4.2 3.9 3.8  CL 105 106 106 104 107  CO2 22 20* 23 23 24   GLUCOSE 154* 213* 213* 157* 173*  BUN 15 17 18 19 20   CREATININE 0.89 1.00  0.80 0.78 0.83  CALCIUM 10.2 8.8* 9.0 9.2 9.2  MG  --  1.8 2.0  --  2.2  PHOS  --  4.6 1.8*  --  2.3*   GFR: Estimated Creatinine Clearance: 72.1 mL/min (by C-G formula based on SCr of 0.83 mg/dL). Recent Labs  Lab 11/04/20 0607 11/05/20 0451 11/05/20 2233 11/06/20 1340  WBC 22.8* 16.5* 17.0* 13.3*  LATICACIDVEN  --   --   --  1.2    Liver Function Tests: Recent Labs  Lab 11/03/20 1448 11/05/20 2233 11/06/20 1340  AST 18 24 24   ALT 13 19 18   ALKPHOS 58 37* 37*  BILITOT 1.1 0.6 0.7  PROT 7.6 6.2* 5.5*  ALBUMIN 4.4 3.0* 2.6*   Recent Labs  Lab 11/03/20 1448  LIPASE 29   Recent Labs  Lab 11/06/20 1340  AMMONIA 27    ABG    Component Value Date/Time   PHART 7.376 11/03/2020 2355   PCO2ART 33.7 11/03/2020 2355   PO2ART 128 (H) 11/03/2020 2355  HCO3 20.6 11/03/2020 2355   ACIDBASEDEF 5.0 (H) 11/03/2020 2355   O2SAT 98.2 11/03/2020 2355     Coagulation Profile: No results for input(s): INR, PROTIME in the last 168 hours.  Cardiac Enzymes: No results for input(s): CKTOTAL, CKMB, CKMBINDEX, TROPONINI in the last 168 hours.  HbA1C: Hgb A1c MFr Bld  Date/Time Value Ref Range Status  11/24/2019 09:16 AM 6.2 (H) 4.8 - 5.6 % Final    Comment:    (NOTE)         Prediabetes: 5.7 - 6.4         Diabetes: >6.4         Glycemic control for adults with diabetes: <7.0   07/06/2018 06:36 PM 6.5 (H) 4.8 - 5.6 % Final    Comment:    (NOTE) Pre diabetes:          5.7%-6.4% Diabetes:              >6.4% Glycemic control for   <7.0% adults with diabetes     CBG: Recent Labs  Lab 11/05/20 1936 11/05/20 2053 11/06/20 0850 11/06/20 1210 11/06/20 1655  GLUCAP 156* 136* 188* 179* 156*    Review of Systems:   Unable to obtain due to patient condition  Past Medical History:  She,  has a past medical history of Asthma, Cancer (HCC) (04/21/2018), Carpal tunnel syndrome, Cataract, Diabetes mellitus without complication (HCC), DVT (deep venous thrombosis) (HCC),  Hypertension, Pneumonia (04/21/2018), Pulmonary embolism (HCC) (03/2018), and Sepsis (HCC).   Surgical History:   Past Surgical History:  Procedure Laterality Date   ABDOMINAL HYSTERECTOMY     biopsy of right breast     ESOPHAGOGASTRODUODENOSCOPY (EGD) WITH PROPOFOL N/A 09/12/2018   Procedure: ESOPHAGOGASTRODUODENOSCOPY (EGD) WITH PROPOFOL (scope in 1017, scope out 1032);  Surgeon: Lucretia Roers, MD;  Location: [facility];  Service: General;  Laterality: N/A;   FOOT SURGERY     implant left eye     INTRAOCULAR LENS INSERTION     IR GASTROSTOMY TUBE MOD SED  12/05/2018   LAPAROTOMY N/A 11/03/2020   Procedure: EXPLORATORY LAPAROTOMY BOWEL RESECTION;  Surgeon: Franky Macho, MD;  Location: [facility];  Service: General;  Laterality: N/A;   PEG PLACEMENT N/A 09/12/2018   Procedure: PERCUTANEOUS ENDOSCOPIC GASTROSTOMY (PEG) PLACEMENT;  Surgeon: Lucretia Roers, MD;  Location: [facility];  Service: General;  Laterality: N/A;     Social History:   reports that she has never smoked. She has never used smokeless tobacco. She reports previous alcohol use. She reports that she does not use drugs.   Family History:  Her family history includes Heart attack in her mother; Heart failure in her mother; Kidney failure in an other family member; Leukemia in her father; Stroke in her mother.   Allergies Allergies  Allergen Reactions   Banana Anaphylaxis   Aspirin Other (See Comments)    G.I. Upset      Home Medications  Prior to Admission medications   Medication Sig Start Date End Date Taking? Authorizing Provider  albuterol (PROVENTIL HFA;VENTOLIN HFA) 108 (90 BASE) MCG/ACT inhaler Inhale 2 puffs into the lungs every 6 (six) hours as needed for wheezing.   Yes [provider]  amLODipine (NORVASC) 10 MG tablet Take 10 mg by mouth daily. 05/13/20  Yes [provider]  atorvastatin (LIPITOR) 20 MG tablet Take 1 tablet by mouth daily. 09/26/20  Yes [provider]  Colchicine  (MITIGARE) 0.6 MG CAPS Take 0.6 mg by mouth daily as needed.  Patient taking differently: Take 0.6 mg by mouth daily as needed (gout). 12/04/19  Yes Doreatha Massed, MD  enoxaparin (LOVENOX) 150 MG/ML injection Inject 1 mL (150 mg total) into the skin daily. 12/06/18  Yes Johnson, Clanford L, MD  hydrocortisone (ANUSOL-HC) 2.5 % rectal cream Place 1 application rectally daily as needed. 11/19/18  Yes [provider]  latanoprost (XALATAN) 0.005 % ophthalmic solution Place 1 drop into both eyes in the morning and at bedtime.  01/14/20  Yes [provider]  megestrol (MEGACE) 400 MG/10ML suspension Take 10 mLs (400 mg total) by mouth 2 (two) times daily. 10/13/20  Yes Doreatha Massed, MD  osimertinib mesylate (TAGRISSO) 40 MG tablet Take 3 tablets (120 mg total) by mouth daily. 10/13/20  Yes Doreatha Massed, MD  pantoprazole (PROTONIX) 40 MG tablet TAKE 1 TABLET BY MOUTH ONCE DAILY. Patient taking differently: Take 40 mg by mouth daily. 03/25/19  Yes Lockamy, Randi L, NP-C  potassium chloride (KLOR-CON) 10 MEQ tablet TAKE (2) TABLETS BY MOUTH TWICE DAILY. Patient taking differently: Take 20 mEq by mouth 2 (two) times daily. Patient states that she is taking 04/14/20  Yes Doreatha Massed, MD  predniSONE (DELTASONE) 50 MG tablet Take one tablet daily for 5 days Patient taking differently: Take 50 mg by mouth daily with breakfast. Take one tablet daily for 5 days 10/07/20  Yes Idol, Raynelle Fanning, PA-C  zolpidem (AMBIEN CR) 6.25 MG CR tablet Take 1 tablet (6.25 mg total) by mouth at bedtime as needed for sleep. 10/16/18  Yes Doreatha Massed, MD     Critical care time: 72     The patient is critically ill due to stroke, encephalopathy, impending respiratory failure hypertensive crisis.  Critical care was necessary to treat or prevent imminent or life-threatening deterioration.  Critical care was time spent personally by me on the following activities: development of  treatment plan with patient and/or surrogate as well as nursing, discussions with consultants, evaluation of patient's response to treatment, examination of patient, obtaining history from patient or surrogate, ordering and performing treatments and interventions, ordering and review of laboratory studies, ordering and review of radiographic studies, pulse oximetry, re-evaluation of patient's condition and participation in multidisciplinary rounds.   Critical Care Time devoted to patient care services described in this note is 49 minutes. This time reflects time of care of this signee Charlott Holler . This critical care time does not reflect separately billable procedures or procedure time, teaching time or supervisory time of PA/NP/Med student/Med Resident etc but could involve care discussion time.       Charlott Holler [facility] 11/06/2020 6:37 PM  Pager: see AMION  If no response to pager , please call critical care on call (see AMION) until 7pm After 7:00 pm call Elink

## 2020-11-06 NOTE — Progress Notes (Addendum)
eurology Progress Note  IDENTIFYING INFORMATION  SELISA TENSLEY MR# [id number] 11/06/2020  HISTORY OF PRESENT ILLNESS  Kaitlin [name] is a 70 y.o. female who  has a past medical history of Asthma, Cancer ([place]) (04/21/2018), Carpal tunnel syndrome, Cataract, Diabetes mellitus without complication ([place]), DVT (deep venous thrombosis) ([place]), Hypertension, Pneumonia (04/21/2018), Pulmonary embolism ([place]) (03/2018), and Sepsis ([place]).  History of metastatic lung cancer with metastasis to the brain last normal until 17:43 on 11/06/2020. At baseline, she uses a walker, but needs a little help with using it. She has undergone repeat radiation and is undergoing chemotherapy with osimertinib.  She was in her normal state of health 5:43 PM, and then around 6:50 PM she was discovered to have significant left-sided weakness and right-sided gaze.  A code stroke was activated and she was taken for head CT which reveals a small area of vasogenic edema in the right parietal lobe.  On exam, she had significant left-sided weakness with right-sided gaze and left hemianopia consistent with a right MCA or ICA syndrome.  She was taken for an emergent CTA which revealed a right ICA occlusion with normal perfusion scan.  She does not have any intracranial occlusion.  INTERVAL HISTORY     MEDICATIONS    Current Facility-Administered Medications (Endocrine & Metabolic):    insulin aspart (novoLOG) injection 0-5 Units   insulin aspart (novoLOG) injection 0-9 Units   methylPREDNISolone sodium succinate (SOLU-MEDROL) 40 mg/mL injection 40 mg   Current Facility-Administered Medications (Cardiovascular):    hydrALAZINE (APRESOLINE) injection 15 mg   metoprolol tartrate (LOPRESSOR) injection 5 mg   Current Facility-Administered Medications (Respiratory):    ipratropium-albuterol (DUONEB) 0.5-2.5 (3) MG/3ML nebulizer solution 3 mL   Current Facility-Administered Medications (Analgesics):    acetaminophen (TYLENOL) tablet 650  mg **OR** acetaminophen (TYLENOL) suppository 650 mg   fentaNYL (SUBLIMAZE) injection 25 mcg   fentaNYL (SUBLIMAZE) injection 25-100 mcg     Current Facility-Administered Medications (Other):     stroke: mapping our early stages of recovery book   0.9 %  sodium chloride infusion   Chlorhexidine Gluconate Cloth 2 % PADS 6 each   ondansetron (ZOFRAN-ODT) disintegrating tablet 4 mg **OR** ondansetron (ZOFRAN) injection 4 mg   pantoprazole (PROTONIX) injection 40 mg   phenol (CHLORASEPTIC) mouth spray 1 spray  No current outpatient medications on file.  VITAL SIGNS  Temp:  [97.8 F (36.6 C)-99.4 F (37.4 C)] 98.9 F (37.2 C) (06/12 0400) Pulse Rate:  [88-126] 103 (06/12 0700) Resp:  [14-27] 20 (06/12 0700) BP: (146-193)/(75-115) 181/95 (06/12 0700) SpO2:  [91 %-98 %] 98 % (06/12 0119) Weight:  [88.4 kg-88.6 kg] 88.6 kg (06/12 0119)  PHYSICAL EXAM   General Physical Exam  General: NAD, lying comfortably in bed HENT: Normal oropharynx and mucosa. Normal external appearance of ears and nose. CV/Chest: No JVD, normal S1S2 Lungs: No audible wheezing. Normal work of breathing. No accessory muscle use Abdomen: Non distended, non tender Extremities: Warm and well perfused. No appreciable edema, cyanosis or deformity. Skin: No rash. Normal palpation of skin.   Musculoskeletal: No joint tenderness. Normal digits and nails by inspection. No clubbing.  Neurologic Examination  Mental status/Cognition: Somnolent with eyes closed resists eye opening. She is obtunded with grimacing and withdraws with right to pain. She moves right extremities spontaneously and purposefully. Speech/language: [employer] are full. Pupils are equal, round, briskly reactive  5-61mm ----> 46mm to light. Right gaze deviation which can be overcome by head turn.   Deep Tendon Reflexes:  3+ and symmetric in the biceps and patellae. FNF and HKS unable to test    IMAGING/DIAGNOSTIC STUDIES   CT head showed  1. No acute intracranial infarct or other abnormality. 2. ASPECTS is 10. 3. Small area of vasogenic edema involving the right parietal lobe, likely related to metastatic disease, also seen on prior brain MRI from 09/30/2020. Overall, appearance is similar to perhaps mildly progressed from prior. Additional punctate calcifications at the left frontal lobe and left cerebellum likely reflect treated disease, stable. 4. Underlying atrophy with chronic small vessel ischemic disease.  CTA head and neck showed occlusion of the right ICA just distal to the bifurcation, new as compared to previous MRI from 09/30/2020, and likely acute in nature. Right ICA remains occluded through the siphon, with distal reconstitution at the terminus related to collateral flow across the circle-of-Willis. Right MCA perfused without downstream occlusion. 2. Atheromatous plaque about the left carotid bulb up to 50% stenosis by NASCET criteria. 3. Moderate to severe stenoses at origins of both vertebral arteries, right worse than left. 4. Intracranial atherosclerotic disease including short-segment moderate right A1 and distal left M1 stenoses.  MRI brain showed large acute right MCA territory infarction. No evidence of hemorrhagic transformation or significant mass effect. There are acute infarcts along left parasagittal region in the frontoparietal/ACA territory.   Lab Results  Component Value Date   HGBA1C 6.2 (H) 11/24/2019   Lab Results  Component Value Date   LDLCALC 66 11/06/2020   ASSESSMENT AND PLAN  ?ary YANAI HOBSON is a 70 y.o. female right handed who presents with symptomatic right ICA occlusion and large artery right MCA syndrome. MRI reviewed which showed restricted diffusion in the entire right MCA territory with significantly increased risk for cerebral swelling. Discussed this with her daughter and the benefit for hyperosmotic therapy outweighed the risk and mannitol was given and she was transferred to NICU  for closer monitoring and for hyper osmotic therapy. Though she was somnolent she continued to be able to squeeze and let go on command with her right hand during transport. On arrival to unit, she was noted to develop forced left gaze deviation which could not be overcome with head turn associated with rhythmic hippus which subsided. Seizure was suspected and she was loaded with LEV 4,000mg  IV and continued 1,000mg  BID.   Thromboembolic stroke in right MCA territory in setting of right ICA occlusion. Start nicardipine drip for SBP 140-160 for now. NaCl 3% 120ml bolus and continue 51ml per hour for target serum Na 150-155. Serum Na q6 hours. Load LEV 4,000mg  IV and continue 1,000mg  BID Routine EEG. Antiplatelet therapy: aspirin 325mg  once and 81mg  daily with clopidogrel 75mg  daily for now. CT head: CT/CTA - No acute ischemic infarct but some vasogenic edema in the right parietal lobe. CTA brain/neck: Occlusion of the right ICA just distal to the bifurcation MRI: MRI brain showed large acute right MCA territory infarction. No evidence of hemorrhagic transformation or significant mass effect. There are acute infarcts along left parasagittal region in the frontoparietal/ACA territory. LDL: 66 at goal <70 A1C: 6.2 at goal <7  TTE: Pending Statin therapy: Atorvastatin  Hypertension Gradually normalize in 5-7 days Long-term BP goal normotensive <130/90 for patients with diabetes.  Hyperlipidemia LDL 66, goal < 70 Continue statin at discharge  Diabetes type II HgbA1c 6.2, goal < 7.0 Controlled  VTE - SCDs, heparin subq  Discussed code status with daughter and the patient is not currently comfort care and family would like to  [place] to be aggressive with care but DNR.  This patient is critically ill and at significant risk of neurological worsening, death and care requires constant monitoring of vital signs, hemodynamics,respiratory and cardiac monitoring, neurological assessment, discussion  with family, other specialists and medical decision making of high complexity. I spent 75 minutes of neurocritical care time  in the care of  this patient. This was time spent independent of any time provided by nurse practitioner or PA.  Lynnae Sandhoff, MD Stroke Neurology Page: [phone]

## 2020-11-06 NOTE — Progress Notes (Signed)
Pt presenting neuro changes. NIH stroke scale score increase from 28 to 36. Pt responsive to painful stimuli and some reflex movement present. MD made aware.

## 2020-11-06 NOTE — Evaluation (Signed)
Clinical/Bedside Swallow Evaluation Patient Details  Name: Kaitlin [name] MRN: [id number] Date of Birth: 01-10-1951  Today's Date: 11/06/2020 Time: SLP Start Time (ACUTE ONLY): 1030 SLP Stop Time (ACUTE ONLY): 1045 SLP Time Calculation (min) (ACUTE ONLY): 15 min  Past Medical History:  Past Medical History:  Diagnosis Date   Asthma    Cancer (HCC) 04/21/2018   Lung Cancer    Carpal tunnel syndrome    Cataract    left eye   Diabetes mellitus without complication (HCC)    DVT (deep venous thrombosis) (HCC)    Hypertension    Pneumonia 04/21/2018   Pulmonary embolism (HCC) 03/2018   Sepsis (HCC)    Past Surgical History:  Past Surgical History:  Procedure Laterality Date   ABDOMINAL HYSTERECTOMY     biopsy of right breast     ESOPHAGOGASTRODUODENOSCOPY (EGD) WITH PROPOFOL N/A 09/12/2018   Procedure: ESOPHAGOGASTRODUODENOSCOPY (EGD) WITH PROPOFOL (scope in 1017, scope out 1032);  Surgeon: Lucretia Roers, MD;  Location: [facility];  Service: General;  Laterality: N/A;   FOOT SURGERY     implant left eye     INTRAOCULAR LENS INSERTION     IR GASTROSTOMY TUBE MOD SED  12/05/2018   LAPAROTOMY N/A 11/03/2020   Procedure: EXPLORATORY LAPAROTOMY BOWEL RESECTION;  Surgeon: Franky Macho, MD;  Location: [facility];  Service: General;  Laterality: N/A;   PEG PLACEMENT N/A 09/12/2018   Procedure: PERCUTANEOUS ENDOSCOPIC GASTROSTOMY (PEG) PLACEMENT;  Surgeon: Lucretia Roers, MD;  Location: [facility];  Service: General;  Laterality: N/A;   HPI:  Kaitlin [name] is a 70 y.o. female who  has a past medical history of Asthma, Cancer (HCC) (04/21/2018), Carpal tunnel syndrome, Cataract, Diabetes mellitus without complication (HCC), DVT (deep venous thrombosis) (HCC), Hypertension, Pneumonia (04/21/2018), Pulmonary embolism (HCC) (03/2018), and Sepsis (HCC).   History of metastatic lung cancer with metastasis to the brain. Code Stroke called 11/06/20 while already hospitalized for abdominal pain and vomiting.  Head CT 11/06/20 showed No acute intracranial infarct or other abnormality. Small area of vasogenic edema involving the right parietal lobe,  likely related to metastatic disease, also seen on prior brain MRI  from 09/30/2020. Overall, appearance is similar to perhaps mildly progressed from prior. Additional punctate calcifications at the  left frontal lobe and left cerebellum likely reflect treated  disease, stable.   Assessment / Plan / Recommendation Clinical Impression  Kaitlin [name] is severely lethargic, requiring sternal rub to awaken. She was hospitalized for SBO and had subsequent neuro event; undergoing testing. She still has NGT to suction for SBO; this evaluation was cleared with MD. Unfortunately, pt had very minimal participation. She could not follow directions for oral mech exam, but was noted with dry mucosa and some missing teeth. She took single ice chip, but had absent oral response and just let it melt. She eventually had a delayed swallow with no cough response. At this time, pt cannot be recommended for an oral diet based on decreased level of alertness. Should pt become alert and request PO, she may have ice chips after oral care. ST service to follow for further evaluation and cognitive evaluation once appropriate.   SLP Visit Diagnosis: Dysphagia, oral phase (R13.11)    Aspiration Risk  Moderate aspiration risk    Diet Recommendation NPO;Ice chips PRN after oral care   Medication Administration: Via alternative means    Other  Recommendations Oral Care Recommendations: Oral care QID;Oral care prior to ice chip/H20   Follow up Recommendations  Frequency and Duration min 2x/week          Prognosis Prognosis for Safe Diet Advancement: Fair Barriers to Reach Goals: Severity of deficits      Swallow Study   General Date of Onset: 11/06/20 HPI: Kaitlin [name] is a 70 y.o. female who  has a past medical history of Asthma, Cancer (HCC) (04/21/2018), Carpal tunnel  syndrome, Cataract, Diabetes mellitus without complication (HCC), DVT (deep venous thrombosis) (HCC), Hypertension, Pneumonia (04/21/2018), Pulmonary embolism (HCC) (03/2018), and Sepsis (HCC).   History of metastatic lung cancer with metastasis to the brain. Code Stroke called 11/06/20 while already hospitalized for abdominal pain and vomiting. Head CT 11/06/20 showed No acute intracranial infarct or other abnormality. Small area of vasogenic edema involving the right parietal lobe,  likely related to metastatic disease, also seen on prior brain MRI  from 09/30/2020. Overall, appearance is similar to perhaps mildly progressed from prior. Additional punctate calcifications at the  left frontal lobe and left cerebellum likely reflect treated  disease, stable. Type of Study: Bedside Swallow Evaluation Previous Swallow Assessment: none Diet Prior to this Study: NPO Temperature Spikes Noted: No Respiratory Status: Room air History of Recent Intubation: No Behavior/Cognition: Lethargic/Drowsy;Requires cueing Oral Cavity Assessment: Dry Oral Care Completed by SLP: Recent completion by staff Oral Cavity - Dentition: Adequate natural dentition Patient Positioning: Upright in bed Baseline Vocal Quality: Low vocal intensity Volitional Cough: Cognitively unable to elicit Volitional Swallow: Unable to elicit    Oral/Motor/Sensory Function Overall Oral Motor/Sensory Function: Other (comment) (unable to assess)   Ice Chips Ice chips: Impaired Oral Phase Impairments: Reduced labial seal;Poor awareness of bolus Pharyngeal Phase Impairments: Suspected delayed Swallow   Thin Liquid   Deferred   Puree   Deferred  Solid    Kaitlin [name], M.S., CCC-SLP Speech-Language Pathologist Acute Rehabilitation Services Pager: [phone]    Deferred     Kaitlin [name] P Jasn Xia 11/06/2020,10:46 AM

## 2020-11-06 NOTE — Significant Event (Signed)
Rapid Response Event Note   Reason for Call :  Worsening mentation  Initial Focused Assessment:  Called by charge RN who stated that this patient had imaging done 6/12 that showed worsening cerebral edema. Neurology ordered mannitol for cerebral edema. Patient status complicated with lung cancer with mets to brain and recent ischemic bowel. Patient was minimally responsive on arrival and would answer daughter with single word response.     Interventions:  Mannitol administered Patient transferred to 4N ICU  Event Summary:   MD Notified: Neurology Call Time: [place] Time: [place] Time: [place], RN

## 2020-11-06 NOTE — Progress Notes (Signed)
OT Cancellation Note  Patient Details Name: Kaitlin [name] MRN: [id number] DOB: 02/09/1951   Cancelled Treatment:    Reason Eval/Treat Not Completed: Medical issues which prohibited therapy. Spoke with RN and MD pt is basically unresponsive and going down shortly for a MRI. Will defer eval for today and follow up tomorrow. Golden Circle, OTR/L Acute Rehab Services Pager [phone] Office [phone]    Almon Register 11/06/2020, 1:40 PM

## 2020-11-06 NOTE — Progress Notes (Signed)
Pt. Arrived from [facility] with NIH-23/Mews Yellow and VSS. MD notified as well as CCMD. NG->ILWS. See PCR for vitals. Will continue to monitor closely

## 2020-11-06 NOTE — Consult Note (Signed)
[facility] Surgery Consult Note  Kaitlin [name] [birth year]  [id number].    Requesting MD: Kathlen Mody Chief Complaint/Reason for Consult: SBO  HPI:  Kaitlin [name] is a 70yo female PMH metastatic lung cancer with brain mets who was transferred from [facility] to [facility] last night for neurology evaluation after code stroke. She was noted to have left sided weakness and right-sided gaze yesterday evening. Code stroke was activated and she was taken for head CT which revealed a small area of vasogenic edema in the right parietal lobe; CTA revealed a right ICA occlusion with normal perfusion scan. Neurology is following.  Patient was admitted to the hospital 11/03/20 for closed loop SBO and underwent Exploratory laparotomy, partial small bowel resection with approximately 100cm small bowel resected by Dr. Lovell Sheehan. She has had an ileus postoperatively and NG tube remains in place on LIWS. She does not verbalize for me but does nod appropriately to questions. She denies any abdominal pain at this time, and shakes her head "no" when asked if passing any gas or had a bowel movement. She had 600 bilious output from NG over night.    Review of Systems  Unable to perform ROS: Patient nonverbal    Family History  Problem Relation Age of Onset   Heart failure Mother    Stroke Mother    Heart attack Mother    Kidney failure Other    Leukemia Father     Past Medical History:  Diagnosis Date   Asthma    Cancer (HCC) 04/21/2018   Lung Cancer    Carpal tunnel syndrome    Cataract    left eye   Diabetes mellitus without complication (HCC)    DVT (deep venous thrombosis) (HCC)    Hypertension    Pneumonia 04/21/2018   Pulmonary embolism (HCC) 03/2018   Sepsis (HCC)     Past Surgical History:  Procedure Laterality Date   ABDOMINAL HYSTERECTOMY     biopsy of right breast     ESOPHAGOGASTRODUODENOSCOPY (EGD) WITH PROPOFOL N/A 09/12/2018   Procedure: ESOPHAGOGASTRODUODENOSCOPY (EGD) WITH PROPOFOL  (scope in 1017, scope out 1032);  Surgeon: Lucretia Roers, MD;  Location: [facility];  Service: General;  Laterality: N/A;   FOOT SURGERY     implant left eye     INTRAOCULAR LENS INSERTION     IR GASTROSTOMY TUBE MOD SED  12/05/2018   LAPAROTOMY N/A 11/03/2020   Procedure: EXPLORATORY LAPAROTOMY BOWEL RESECTION;  Surgeon: Franky Macho, MD;  Location: [facility];  Service: General;  Laterality: N/A;   PEG PLACEMENT N/A 09/12/2018   Procedure: PERCUTANEOUS ENDOSCOPIC GASTROSTOMY (PEG) PLACEMENT;  Surgeon: Lucretia Roers, MD;  Location: [facility];  Service: General;  Laterality: N/A;    Social History:  reports that she has never smoked. She has never used smokeless tobacco. She reports previous alcohol use. She reports that she does not use drugs.  Allergies:  Allergies  Allergen Reactions   Banana Anaphylaxis   Aspirin Other (See Comments)    G.I. Upset     Medications Prior to Admission  Medication Sig Dispense Refill   albuterol (PROVENTIL HFA;VENTOLIN HFA) 108 (90 BASE) MCG/ACT inhaler Inhale 2 puffs into the lungs every 6 (six) hours as needed for wheezing.     amLODipine (NORVASC) 10 MG tablet Take 10 mg by mouth daily.     atorvastatin (LIPITOR) 20 MG tablet Take 1 tablet by mouth daily.     Colchicine (MITIGARE) 0.6 MG CAPS Take 0.6 mg by  mouth daily as needed. (Patient taking differently: Take 0.6 mg by mouth daily as needed (gout).) 15 capsule 3   enoxaparin (LOVENOX) 150 MG/ML injection Inject 1 mL (150 mg total) into the skin daily. 0 mL    hydrocortisone (ANUSOL-HC) 2.5 % rectal cream Place 1 application rectally daily as needed.     latanoprost (XALATAN) 0.005 % ophthalmic solution Place 1 drop into both eyes in the morning and at bedtime.      megestrol (MEGACE) 400 MG/10ML suspension Take 10 mLs (400 mg total) by mouth 2 (two) times daily. 480 mL 3   osimertinib mesylate (TAGRISSO) 40 MG tablet Take 3 tablets (120 mg total) by mouth daily. 90 tablet 3   pantoprazole  (PROTONIX) 40 MG tablet TAKE 1 TABLET BY MOUTH ONCE DAILY. (Patient taking differently: Take 40 mg by mouth daily.) 30 tablet 0   potassium chloride (KLOR-CON) 10 MEQ tablet TAKE (2) TABLETS BY MOUTH TWICE DAILY. (Patient taking differently: Take 20 mEq by mouth 2 (two) times daily. Patient states that she is taking ) 120 tablet 3   predniSONE (DELTASONE) 50 MG tablet Take one tablet daily for 5 days (Patient taking differently: Take 50 mg by mouth daily with breakfast. Take one tablet daily for 5 days) 5 tablet 0   zolpidem (AMBIEN CR) 6.25 MG CR tablet Take 1 tablet (6.25 mg total) by mouth at bedtime as needed for sleep. 30 tablet 0    Prior to Admission medications   Medication Sig Start Date End Date Taking? Authorizing Provider  albuterol (PROVENTIL HFA;VENTOLIN HFA) 108 (90 BASE) MCG/ACT inhaler Inhale 2 puffs into the lungs every 6 (six) hours as needed for wheezing.   Yes [provider]  amLODipine (NORVASC) 10 MG tablet Take 10 mg by mouth daily. 05/13/20  Yes [provider]  atorvastatin (LIPITOR) 20 MG tablet Take 1 tablet by mouth daily. 09/26/20  Yes [provider]  Colchicine (MITIGARE) 0.6 MG CAPS Take 0.6 mg by mouth daily as needed. Patient taking differently: Take 0.6 mg by mouth daily as needed (gout). 12/04/19  Yes Doreatha Massed, MD  enoxaparin (LOVENOX) 150 MG/ML injection Inject 1 mL (150 mg total) into the skin daily. 12/06/18  Yes Johnson, Clanford L, MD  hydrocortisone (ANUSOL-HC) 2.5 % rectal cream Place 1 application rectally daily as needed. 11/19/18  Yes [provider]  latanoprost (XALATAN) 0.005 % ophthalmic solution Place 1 drop into both eyes in the morning and at bedtime.  01/14/20  Yes [provider]  megestrol (MEGACE) 400 MG/10ML suspension Take 10 mLs (400 mg total) by mouth 2 (two) times daily. 10/13/20  Yes Doreatha Massed, MD  osimertinib mesylate (TAGRISSO) 40 MG tablet Take 3 tablets (120 mg  total) by mouth daily. 10/13/20  Yes Doreatha Massed, MD  pantoprazole (PROTONIX) 40 MG tablet TAKE 1 TABLET BY MOUTH ONCE DAILY. Patient taking differently: Take 40 mg by mouth daily. 03/25/19  Yes Lockamy, Randi L, NP-C  potassium chloride (KLOR-CON) 10 MEQ tablet TAKE (2) TABLETS BY MOUTH TWICE DAILY. Patient taking differently: Take 20 mEq by mouth 2 (two) times daily. Patient states that she is taking 04/14/20  Yes Doreatha Massed, MD  predniSONE (DELTASONE) 50 MG tablet Take one tablet daily for 5 days Patient taking differently: Take 50 mg by mouth daily with breakfast. Take one tablet daily for 5 days 10/07/20  Yes Idol, Raynelle Fanning, PA-C  zolpidem (AMBIEN CR) 6.25 MG CR tablet Take 1 tablet (6.25 mg total) by mouth at  bedtime as needed for sleep. 10/16/18  Yes Doreatha Massed, MD    Blood pressure (!) 181/95, pulse (!) 103, temperature 98.9 F (37.2 C), temperature source Oral, resp. rate 20, height 5\' 7"  (1.702 m), weight 88.6 kg, SpO2 98 %. Physical Exam: General: chronically ill appearing female who is laying in bed in NAD HEENT: head is normocephalic, atraumatic.  Sclera are noninjected.  Pupils equal and round.  Ears and nose without any masses or lesions.  Mouth is pink and moist. Dentition fair Heart: mild tachycardia, HR low 100s.  Normal s1,s2. No obvious murmurs, gallops, or rubs noted.  Palpable pedal pulses bilaterally  Lungs: CTAB, no wheezes, rhonchi, or rales noted.  Respiratory effort nonlabored Abd: midline incision cdi with staples present and honeycomb dressing in place, soft, nontender, ND, hypoactive BS, no masses, hernias, or organomegaly MS: calves soft and nontender. Weakness noted to LUE/LLE Skin: warm and dry with no masses, lesions, or rashes Psych: Alert, opens eyes intermittently, nods appropriately to questions but does not verbalize Neuro: weakness noted to LUE/LLE  Results for orders placed or performed during the hospital encounter of  11/03/20 (from the past 48 hour(s))  Glucose, capillary     Status: Abnormal   Collection Time: 11/04/20  9:31 PM  Result Value Ref Range   Glucose-Capillary 190 (H) 70 - 99 mg/dL    Comment: Glucose reference range applies only to samples taken after fasting for at least 8 hours.  Basic metabolic panel     Status: Abnormal   Collection Time: 11/05/20  3:35 AM  Result Value Ref Range   Sodium 135 135 - 145 mmol/L   Potassium 4.2 3.5 - 5.1 mmol/L   Chloride 106 98 - 111 mmol/L   CO2 23 22 - 32 mmol/L   Glucose, Bld 213 (H) 70 - 99 mg/dL    Comment: Glucose reference range applies only to samples taken after fasting for at least 8 hours.   BUN 18 8 - 23 mg/dL   Creatinine, Ser 4.40 0.44 - 1.00 mg/dL   Calcium 9.0 8.9 - 34.7 mg/dL   GFR, Estimated >42 >59 mL/min    Comment: (NOTE) Calculated using the CKD-EPI Creatinine Equation (2021)    Anion gap 6 5 - 15    Comment: Performed at [facility], [address], [place], [place] [address]  Magnesium     Status: None   Collection Time: 11/05/20  3:35 AM  Result Value Ref Range   Magnesium 2.0 1.7 - 2.4 mg/dL    Comment: Performed at [facility], [address], [place], [place] [address]  Phosphorus     Status: Abnormal   Collection Time: 11/05/20  3:35 AM  Result Value Ref Range   Phosphorus 1.8 (L) 2.5 - 4.6 mg/dL    Comment: Performed at [facility], [address] [place], [place] [address]  CBC     Status: Abnormal   Collection Time: 11/05/20  4:51 AM  Result Value Ref Range   WBC 16.5 (H) 4.0 - 10.5 K/uL   RBC 4.00 3.87 - 5.11 MIL/uL   Hemoglobin 11.4 (L) 12.0 - 15.0 g/dL   HCT 84.1 (L) 66.0 - 63.0 %   MCV 87.0 80.0 - 100.0 fL   MCH 28.5 26.0 - 34.0 pg   MCHC 32.8 30.0 - 36.0 g/dL   RDW 16.0 (H) 10.9 - 32.3 %   Platelets 188 150 - 400 K/uL   nRBC 0.0 0.0 - 0.2 %    Comment:  Performed at [facility], [address] [place], [place] [address]  Glucose, capillary     Status: Abnormal   Collection Time: 11/05/20  7:38  AM  Result Value Ref Range   Glucose-Capillary 190 (H) 70 - 99 mg/dL    Comment: Glucose reference range applies only to samples taken after fasting for at least 8 hours.  Glucose, capillary     Status: Abnormal   Collection Time: 11/05/20 11:17 AM  Result Value Ref Range   Glucose-Capillary 139 (H) 70 - 99 mg/dL    Comment: Glucose reference range applies only to samples taken after fasting for at least 8 hours.  Hemoglobin and hematocrit, blood     Status: Abnormal   Collection Time: 11/05/20  1:57 PM  Result Value Ref Range   Hemoglobin 11.3 (L) 12.0 - 15.0 g/dL   HCT 29.5 (L) 28.4 - 13.2 %    Comment: Performed at [facility], [address], [place], [place] [address]  Glucose, capillary     Status: Abnormal   Collection Time: 11/05/20  4:53 PM  Result Value Ref Range   Glucose-Capillary 161 (H) 70 - 99 mg/dL    Comment: Glucose reference range applies only to samples taken after fasting for at least 8 hours.  Glucose, capillary     Status: Abnormal   Collection Time: 11/05/20  7:36 PM  Result Value Ref Range   Glucose-Capillary 156 (H) 70 - 99 mg/dL    Comment: Glucose reference range applies only to samples taken after fasting for at least 8 hours.  Glucose, capillary     Status: Abnormal   Collection Time: 11/05/20  8:53 PM  Result Value Ref Range   Glucose-Capillary 136 (H) 70 - 99 mg/dL    Comment: Glucose reference range applies only to samples taken after fasting for at least 8 hours.  CBC     Status: Abnormal   Collection Time: 11/05/20 10:33 PM  Result Value Ref Range   WBC 17.0 (H) 4.0 - 10.5 K/uL   RBC 3.83 (L) 3.87 - 5.11 MIL/uL   Hemoglobin 11.1 (L) 12.0 - 15.0 g/dL   HCT 27.2 (L) 53.6 - 64.4 %   MCV 85.9 80.0 - 100.0 fL   MCH 29.0 26.0 - 34.0 pg   MCHC 33.7 30.0 - 36.0 g/dL   RDW 03.4 (H) 74.2 - 59.5 %   Platelets 207 150 - 400 K/uL   nRBC 0.0 0.0 - 0.2 %    Comment: Performed at [facility], [address] [place], [place] [address]  Comprehensive  metabolic panel     Status: Abnormal   Collection Time: 11/05/20 10:33 PM  Result Value Ref Range   Sodium 134 (L) 135 - 145 mmol/L   Potassium 3.9 3.5 - 5.1 mmol/L   Chloride 104 98 - 111 mmol/L   CO2 23 22 - 32 mmol/L   Glucose, Bld 157 (H) 70 - 99 mg/dL    Comment: Glucose reference range applies only to samples taken after fasting for at least 8 hours.   BUN 19 8 - 23 mg/dL   Creatinine, Ser 6.43 0.44 - 1.00 mg/dL   Calcium 9.2 8.9 - 32.9 mg/dL   Total Protein 6.2 (L) 6.5 - 8.1 g/dL   Albumin 3.0 (L) 3.5 - 5.0 g/dL   AST 24 15 - 41 U/L   ALT 19 0 - 44 U/L   Alkaline Phosphatase 37 (L) 38 - 126 U/L   Total Bilirubin 0.6 0.3 -  1.2 mg/dL   GFR, Estimated >16 >10 mL/min    Comment: (NOTE) Calculated using the CKD-EPI Creatinine Equation (2021)    Anion gap 7 5 - 15    Comment: Performed at [facility], [address] [place], [place] [address]  Lipid panel     Status: None   Collection Time: 11/06/20  1:14 AM  Result Value Ref Range   Cholesterol 139 0 - 200 mg/dL   Triglycerides 72 <409 mg/dL   HDL 59 >81 mg/dL   Total CHOL/HDL Ratio 2.4 RATIO   VLDL 14 0 - 40 mg/dL   LDL Cholesterol 66 0 - 99 mg/dL    Comment:        Total Cholesterol/HDL:CHD Risk Coronary Heart Disease Risk Table                     Men   Women  1/2 Average Risk   3.4   3.3  Average Risk       5.0   4.4  2 X Average Risk   9.6   7.1  3 X Average Risk  23.4   11.0        Use the calculated Patient Ratio above and the CHD Risk Table to determine the patient's CHD Risk.        ATP III CLASSIFICATION (LDL):  <100     mg/dL   Optimal  191-478  mg/dL   Near or Above                    Optimal  130-159  mg/dL   Borderline  295-621  mg/dL   High  >308     mg/dL   Very High Performed at [facility], [address], [place], [place] [address]    CT HEAD CODE STROKE WO CONTRAST  Result Date: 11/05/2020 CLINICAL DATA:  Code stroke.  Initial evaluation for acute stroke. EXAM: CT HEAD WITHOUT  CONTRAST TECHNIQUE: Contiguous axial images were obtained from the base of the skull through the vertex without intravenous contrast. COMPARISON:  Previous MRI from 09/30/2020. FINDINGS: Brain: Generalized cerebral atrophy with chronic small vessel ischemic disease. No acute intracranial hemorrhage. No acute large vessel territory infarct by CT. Few punctate calcifications involving the left frontal region and left cerebellum again seen, likely reflecting treated metastases, also seen on prior CT. Small area of vasogenic edema at the right parietal region also likely related to metastatic disease, also seen on prior MRI, similar to perhaps mildly progressed (series 3, image 29). No other visible new mass or metastatic disease by CT. No significant mass effect or midline shift. No hydrocephalus or extra-axial fluid collection. Vascular: No hyperdense vessel. Calcified atherosclerosis present at skull base. Skull: Scalp soft tissues demonstrate no acute finding. Calvarium intact. Sinuses/Orbits: Globes and orbital soft tissues demonstrate no acute finding. Nasogastric tube in place. Sequelae of prior sinus surgery with underlying chronic mucosal thickening. Small bilateral mastoid effusions. Other: None. ASPECTS [facility] Stroke Program Early CT Score) - Ganglionic level infarction (caudate, lentiform nuclei, internal capsule, insula, M1-M3 cortex): 7 - Supraganglionic infarction (M4-M6 cortex): 3 Total score (0-10 with 10 being normal): 10 IMPRESSION: 1. No acute intracranial infarct or other abnormality. 2. ASPECTS is 10. 3. Small area of vasogenic edema involving the right parietal lobe, likely related to metastatic disease, also seen on prior brain MRI from 09/30/2020. Overall, appearance is similar to perhaps mildly progressed from prior. Additional punctate calcifications at the left frontal  lobe and left cerebellum likely reflect treated disease, stable. 4. Underlying atrophy with chronic small vessel ischemic  disease. Critical Value/emergent results were called by telephone at the time of interpretation on 11/05/2020 at 7:35 pm to provider [facility] , who verbally acknowledged these results. Electronically Signed   By: Rise Mu M.D.   On: 11/05/2020 19:47   CT ANGIO HEAD NECK W WO CM W PERF (CODE STROKE)  Result Date: 11/05/2020 CLINICAL DATA:  Initial evaluation for slurred speech, right-sided facial droop. EXAM: CT ANGIOGRAPHY HEAD AND NECK TECHNIQUE: Multidetector CT imaging of the head and neck was performed using the standard protocol during bolus administration of intravenous contrast. Multiplanar CT image reconstructions and MIPs were obtained to evaluate the vascular anatomy. Carotid stenosis measurements (when applicable) are obtained utilizing NASCET criteria, using the distal internal carotid diameter as the denominator. CONTRAST:  OMNIPAQUE IOHEXOL 350 MG/ML SOLN COMPARISON:  Prior head CT from earlier the same day. FINDINGS: CTA NECK FINDINGS Aortic arch: Visualized aortic arch normal in caliber with normal 3 vessel morphology. No hemodynamically significant stenosis seen about the origin of the great vessels. Right carotid system: Right CCA patent from its origin to the bifurcation without stenosis. Atheromatous change about the right carotid bulb/proximal right ICA. There is occlusion of the right ICA just distal to the bifurcation (series 6, image 133). While this is age indeterminate in appearance by this exam, this appears to be new as compared to previous MRI from 09/30/2020, and is suspected to be acute. Right ICA remains occluded distally to the skull base. Left carotid system: Left CCA patent from its origin to the bifurcation without stenosis. Eccentric calcified plaque about the left carotid bulb with up to approximately 50% stenosis by NASCET criteria. Left ICA patent distally without stenosis, dissection or occlusion. Vertebral arteries: Both vertebral arteries arise  from subclavian arteries. No proximal subclavian artery stenosis. Atheromatous plaque at the origins of both vertebral arteries with moderate to severe stenoses bilaterally, right worse than left. Vertebral arteries patent distally within the neck without stenosis, dissection or occlusion. Skeleton: No visible acute osseous finding. No discrete or worrisome osseous lesions. Moderate spondylosis present at C3-4 through C6-7 without high-grade spinal stenosis. Other neck: 9 mm nodule present at the thyroid isthmus, felt to be of doubtful significance given size and patient age, no follow-up imaging recommended (ref: J Am Coll Radiol. 2015 Feb;12(2): 143-50).No other acute soft tissue abnormality within the neck. No other mass or adenopathy. Nasogastric tube in place. Upper chest: Bandlike densities with volume loss visualized upper chest demonstrates no other acute finding. Emanating from the bilateral hilar region/paramediastinal regions again seen, similar to previous PET-CT, and likely reflecting post treatment changes. Minimal subpleural reticular densities at the posterior right upper lobe likely reflect post treatment fibrosis and/or scarring, also stable. Mild soft tissue fullness about the bilateral hila without frank adenopathy also similar. Visualized upper chest demonstrates no other acute finding. Review of the MIP images confirms the above findings CTA HEAD FINDINGS Anterior circulation: Petrous left ICA widely patent. Scattered atheromatous plaque within the left carotid siphon with associated mild to moderate multifocal narrowing. Right ICA remains occluded through the siphon, with distal reconstitution at the terminus related to collateral flow across the circle-of-Willis. Left A1 segment widely patent. Short-segment moderate proximal right A1 stenosis (series 10, image 22). Right A1 partially fenestrated distally. Normal anterior communicating artery complex. Both ACAs patent to their distal aspects  without stenosis. Right M1 segment widely patent. Focal moderate stenosis  at the distal left M1 segment/MCA bifurcation (series 9, image 19). Distal MCA branches well perfused without proximal branch occlusion. Posterior circulation: Atheromatous change within the proximal V4 segments without significant stenosis. Right PICA not well seen. Focal irregularity at the origin of the left PICA of favored to be related to focal vascular tortuosity rather than aneurysm (series 6, image 144). Basilar patent to its distal aspect without stenosis. Superior cerebellar arteries patent bilaterally. Both PCAs primarily supplied via the basilar well perfused to their distal aspects without stenosis. Venous sinuses: Grossly patent allowing for timing the contrast bolus. Anatomic variants: None significant. Review of the MIP images confirms the above findings IMPRESSION: 1. Occlusion of the right ICA just distal to the bifurcation, new as compared to previous MRI from 09/30/2020, and likely acute in nature. Right ICA remains occluded through the siphon, with distal reconstitution at the terminus related to collateral flow across the circle-of-Willis. Right MCA perfused without downstream occlusion. 2. Atheromatous plaque about the left carotid bulb with up to 50% stenosis by NASCET criteria. 3. Moderate to severe stenoses at the origins of both vertebral arteries, right worse than left. 4. Intracranial atherosclerotic disease including short-segment moderate right A1 and distal left M1 stenoses as above. 5. Stable post treatment changes within the visualized lungs. These results were communicated to Dr. Amada Jupiter at 8:52 pmon 6/11/2022by text page via the [facility] messaging system. Electronically Signed   By: Rise Mu M.D.   On: 11/05/2020 21:11    Anti-infectives (From admission, onward)    Start     Dose/Rate Route Frequency Ordered Stop   11/04/20 0600  cefoTEtan (CEFOTAN) 2 g in sodium chloride 0.9 % 100 mL IVPB   Status:  Discontinued        2 g 200 mL/hr over 30 Minutes Intravenous On call to O.R. 11/03/20 2312 11/04/20 0851   11/03/20 1715  cefTRIAXone (ROCEPHIN) 1 g in sodium chloride 0.9 % 100 mL IVPB        1 g 200 mL/hr over 30 Minutes Intravenous  Once 11/03/20 1708 11/03/20 1827        Assessment/Plan Closed-loop small bowel obstruction POD#3 S/p Exploratory laparotomy, partial small bowel resection 6/9 Dr. Lovell Sheehan - approximately 100cm small bowel was resected - Persistent ileus, continue NPO and NGT to LIWS and await return in bowel function.  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

## 2020-11-07 ENCOUNTER — Inpatient Hospital Stay (HOSPITAL_COMMUNITY): Payer: Medicare Other

## 2020-11-07 ENCOUNTER — Inpatient Hospital Stay (HOSPITAL_COMMUNITY): Payer: Medicare Other | Admitting: Hematology

## 2020-11-07 DIAGNOSIS — G9349 Other encephalopathy: Secondary | ICD-10-CM

## 2020-11-07 DIAGNOSIS — Z515 Encounter for palliative care: Secondary | ICD-10-CM

## 2020-11-07 DIAGNOSIS — I6389 Other cerebral infarction: Secondary | ICD-10-CM

## 2020-11-07 DIAGNOSIS — K56609 Unspecified intestinal obstruction, unspecified as to partial versus complete obstruction: Secondary | ICD-10-CM

## 2020-11-07 DIAGNOSIS — I63511 Cerebral infarction due to unspecified occlusion or stenosis of right middle cerebral artery: Secondary | ICD-10-CM

## 2020-11-07 LAB — CBC WITH DIFFERENTIAL/PLATELET
Abs Immature Granulocytes: 0.11 10*3/uL — ABNORMAL HIGH (ref 0.00–0.07)
Basophils Absolute: 0 10*3/uL (ref 0.0–0.1)
Basophils Relative: 0 %
Eosinophils Absolute: 0 10*3/uL (ref 0.0–0.5)
Eosinophils Relative: 0 %
HCT: 38.7 % (ref 36.0–46.0)
Hemoglobin: 12.8 g/dL (ref 12.0–15.0)
Immature Granulocytes: 1 %
Lymphocytes Relative: 4 %
Lymphs Abs: 0.6 10*3/uL — ABNORMAL LOW (ref 0.7–4.0)
MCH: 28.4 pg (ref 26.0–34.0)
MCHC: 33.1 g/dL (ref 30.0–36.0)
MCV: 85.8 fL (ref 80.0–100.0)
Monocytes Absolute: 0.8 10*3/uL (ref 0.1–1.0)
Monocytes Relative: 6 %
Neutro Abs: 11.8 10*3/uL — ABNORMAL HIGH (ref 1.7–7.7)
Neutrophils Relative %: 89 %
Platelets: 257 10*3/uL (ref 150–400)
RBC: 4.51 MIL/uL (ref 3.87–5.11)
RDW: 15.9 % — ABNORMAL HIGH (ref 11.5–15.5)
WBC: 13.2 10*3/uL — ABNORMAL HIGH (ref 4.0–10.5)
nRBC: 0 % (ref 0.0–0.2)

## 2020-11-07 LAB — HEMOGLOBIN A1C
Hgb A1c MFr Bld: 6 % — ABNORMAL HIGH (ref 4.8–5.6)
Hgb A1c MFr Bld: 6.1 % — ABNORMAL HIGH (ref 4.8–5.6)
Mean Plasma Glucose: 126 mg/dL
Mean Plasma Glucose: 128 mg/dL

## 2020-11-07 LAB — PREALBUMIN: Prealbumin: 23.1 mg/dL (ref 18–38)

## 2020-11-07 LAB — ECHOCARDIOGRAM COMPLETE
Area-P 1/2: 2.91 cm2
Height: 67 in
P 1/2 time: 467 msec
S' Lateral: 2.2 cm
Weight: 3125.24 oz

## 2020-11-07 LAB — SODIUM
Sodium: 135 mmol/L (ref 135–145)
Sodium: 137 mmol/L (ref 135–145)

## 2020-11-07 LAB — GLUCOSE, CAPILLARY
Glucose-Capillary: 194 mg/dL — ABNORMAL HIGH (ref 70–99)
Glucose-Capillary: 193 mg/dL — ABNORMAL HIGH (ref 70–99)

## 2020-11-07 LAB — SURGICAL PATHOLOGY

## 2020-11-07 MED ORDER — HYDROMORPHONE HCL 1 MG/ML IJ SOLN
0.2500 mg | INTRAMUSCULAR | Status: DC | PRN
  Administered 2020-11-08: 0.25 mg via INTRAVENOUS
  Filled 2020-11-07: qty 1

## 2020-11-07 MED ORDER — LORAZEPAM 2 MG/ML IJ SOLN
0.2500 mg | Freq: Two times a day (BID) | INTRAMUSCULAR | Status: DC
  Administered 2020-11-07 – 2020-11-08 (×2): 0.25 mg via INTRAVENOUS
  Filled 2020-11-07 (×2): qty 1

## 2020-11-07 MED ORDER — DEXAMETHASONE SODIUM PHOSPHATE 4 MG/ML IJ SOLN
4.0000 mg | Freq: Every day | INTRAMUSCULAR | Status: DC
  Administered 2020-11-07 – 2020-11-08 (×2): 4 mg via INTRAVENOUS
  Filled 2020-11-07 (×2): qty 1

## 2020-11-07 MED ORDER — ASPIRIN 300 MG RE SUPP
300.0000 mg | Freq: Every day | RECTAL | Status: DC
  Administered 2020-11-07: 300 mg via RECTAL
  Filled 2020-11-07: qty 1

## 2020-11-07 MED ORDER — GLYCOPYRROLATE 0.2 MG/ML IJ SOLN
0.2000 mg | INTRAMUSCULAR | Status: DC | PRN
  Administered 2020-11-08: 0.2 mg via INTRAVENOUS
  Filled 2020-11-07: qty 1

## 2020-11-07 MED ORDER — BIOTENE DRY MOUTH MT LIQD: 15.0000 mL | OROMUCOSAL | Status: DC | PRN

## 2020-11-07 MED ORDER — ACETAMINOPHEN 325 MG PO TABS: 650.0000 mg | ORAL_TABLET | Freq: Four times a day (QID) | ORAL | Status: DC | PRN

## 2020-11-07 MED ORDER — ACETAMINOPHEN 650 MG RE SUPP: 650.0000 mg | Freq: Four times a day (QID) | RECTAL | Status: DC | PRN

## 2020-11-07 MED ORDER — LORAZEPAM 2 MG/ML IJ SOLN: 0.5000 mg | INTRAMUSCULAR | Status: DC | PRN

## 2020-11-07 MED ORDER — LABETALOL HCL 5 MG/ML IV SOLN: 5.0000 mg | INTRAVENOUS | Status: DC | PRN

## 2020-11-07 MED ORDER — HYDROMORPHONE HCL 1 MG/ML IJ SOLN
0.2500 mg | Freq: Four times a day (QID) | INTRAMUSCULAR | Status: DC
  Administered 2020-11-07 – 2020-11-08 (×4): 0.25 mg via INTRAVENOUS
  Filled 2020-11-07 (×3): qty 1

## 2020-11-07 MED ORDER — POLYVINYL ALCOHOL 1.4 % OP SOLN: 1.0000 [drp] | Freq: Four times a day (QID) | OPHTHALMIC | Status: DC | PRN

## 2020-11-07 MED ORDER — INSULIN ASPART 100 UNIT/ML IJ SOLN
0.0000 [IU] | INTRAMUSCULAR | Status: DC
  Administered 2020-11-07: 3 [IU] via SUBCUTANEOUS

## 2020-11-07 MED ORDER — HALOPERIDOL 0.5 MG PO TABS
0.5000 mg | ORAL_TABLET | ORAL | Status: DC | PRN
  Filled 2020-11-07: qty 1

## 2020-11-07 MED ORDER — HALOPERIDOL LACTATE 2 MG/ML PO CONC
0.5000 mg | ORAL | Status: DC | PRN
  Filled 2020-11-07: qty 0.3

## 2020-11-07 MED ORDER — HALOPERIDOL LACTATE 5 MG/ML IJ SOLN: 0.5000 mg | INTRAMUSCULAR | Status: DC | PRN

## 2020-11-07 MED ORDER — GLYCOPYRROLATE 0.2 MG/ML IJ SOLN: 0.2000 mg | INTRAMUSCULAR | Status: DC | PRN

## 2020-11-07 MED ORDER — GLYCOPYRROLATE 1 MG PO TABS
1.0000 mg | ORAL_TABLET | ORAL | Status: DC | PRN
  Filled 2020-11-07: qty 1

## 2020-11-07 NOTE — TOC Initial Note (Signed)
Transition of Care [facility]) - Initial/Assessment Note    Patient Details  Name: Kaitlin [name] MRN: [id number] Date of Birth: 10-Jan-1951  Transition of Care [facility]) CM/SW Contact:    Mearl Latin, LCSW Phone Number: 11/07/2020, 3:26 PM  Clinical Narrative:                 CSW received consult regarding hospice facility referral. Patient's family requesting [facility]. CSW left voicemail for Cassandra requesting she review referral   Expected Discharge Plan: Hospice Medical Facility Barriers to Discharge: Hospice Bed not available   Patient Goals and CMS Choice Patient states their goals for this hospitalization and ongoing recovery are:: comfort CMS Medicare.gov Compare Post Acute Care list provided to:: Patient Represenative (must comment) Choice offered to / list presented to : Adult Children  Expected Discharge Plan and Services Expected Discharge Plan: Hospice Medical Facility In-house Referral: Clinical Social Work, Hospice / Palliative Care   Post Acute Care Choice: Hospice                                        Prior Living Arrangements/Services     Patient language and need for interpreter reviewed:: Yes Do you feel safe going back to the place where you live?: Yes      Need for Family Participation in Patient Care: Yes (Comment) Care giver support system in place?: Yes (comment)   Criminal Activity/Legal Involvement Pertinent to Current Situation/Hospitalization: No - Comment as needed  Activities of Daily Living Home Assistive Devices/Equipment: None ADL Screening (condition at time of admission) Patient's cognitive ability adequate to safely complete daily activities?: Yes Is the patient deaf or have difficulty hearing?: No Does the patient have difficulty seeing, even when wearing glasses/contacts?: No Does the patient have difficulty concentrating, remembering, or making decisions?: Yes Patient able to express need for assistance with ADLs?:  Yes Does the patient have difficulty dressing or bathing?: Yes Independently performs ADLs?: No Communication: Independent Dressing (OT): Needs assistance Is this a change from baseline?: Change from baseline, expected to last <3days Grooming: Needs assistance Is this a change from baseline?: Change from baseline, expected to last <3 days Feeding: Independent Bathing: Dependent Is this a change from baseline?: Change from baseline, expected to last <3 days Toileting: Needs assistance Is this a change from baseline?: Change from baseline, expected to last <3 days In/Out Bed: Dependent Is this a change from baseline?: Change from baseline, expected to last <3 days Walks in Home: Independent with device (comment) Does the patient have difficulty walking or climbing stairs?: Yes Weakness of Legs: Both Weakness of Arms/Hands: Both  Permission Sought/Granted Permission sought to share information with : Facility Medical sales representative, Family Supports Permission granted to share information with : No  Share Information with NAME: [name]  Permission granted to share info w AGENCY: Hospice  Permission granted to share info w Relationship: Daughter  Permission granted to share info w Contact Information: [phone]  Emotional Assessment Appearance:: Appears stated age Attitude/Demeanor/Rapport: Unable to Assess Affect (typically observed): Unable to Assess Orientation: :  (unable to follow commands) Alcohol / Substance Use: Not Applicable Psych Involvement: No (comment)  Admission diagnosis:  Small bowel obstruction (HCC) [K56.609] Ischemic necrosis of small bowel (HCC) [K55.029] Patient Active Problem List   Diagnosis Date Noted   Acute metabolic encephalopathy 11/06/2020   ICAO (internal carotid artery occlusion), right 11/06/2020   Small bowel  obstruction (HCC)    Ischemic necrosis of small bowel (HCC)    Prolapsed internal hemorrhoids, grade 3 04/18/2019   Anal polyp  04/18/2019   Malnutrition of moderate degree 12/03/2018   PEG tube malfunction (HCC) 12/02/2018   History of pulmonary embolism 12/02/2018   Protein-calorie malnutrition, severe 09/12/2018   Metastatic lung cancer (metastasis from lung to other site), left [facility])    Nausea and vomiting 09/11/2018   Weight loss 09/11/2018   Hypokalemia 09/09/2018   Brain metastases (HCC) 07/12/2018   Malignant pericardial effusion (HCC) 07/08/2018   HCAP (healthcare-associated pneumonia) 07/06/2018   Adenocarcinoma of lung, right (HCC) 07/06/2018   Adenocarcinoma, lung, left (HCC) 07/03/2018   Asthma 06/16/2018   Hypertension 06/16/2018   Diabetes mellitus without complication (HCC) 06/16/2018   DVT (deep venous thrombosis) (HCC) 06/16/2018   Pulmonary embolism during treatment with long-term anticoagulation therapy (HCC) 06/16/2018   Lung mass    Pulmonary emboli (HCC) 04/24/2018   Routine screening for STI (sexually transmitted infection) 03/14/2016   PCP:  [name], MD Pharmacy:   [facility] - [place], [place] - [address] [place] [address] Phone: [phone] Fax: [phone]     Social Determinants of Health (SDOH) Interventions    Readmission Risk Interventions No flowsheet data found.

## 2020-11-07 NOTE — Progress Notes (Signed)
OT Cancellation Note  Patient Details Name: Kaitlin [name] MRN: [id number] DOB: Oct 29, 1950   Cancelled Treatment:    Reason Eval/Treat Not Completed: Patient not medically ready  Kaitlin [name], OT/L   Acute OT Clinical Specialist Acute Rehabilitation Services Pager [phone] Office [phone]  11/07/2020, 8:13 AM

## 2020-11-07 NOTE — Progress Notes (Signed)
  Echocardiogram 2D Echocardiogram has been performed.  Fidel Levy 11/07/2020, 8:48 AM

## 2020-11-07 NOTE — Consult Note (Signed)
Consultation Note Date: 11/07/2020   Patient Name: Kaitlin [name]  DOB: 02-08-1951  MRN: [id number]  Age / Sex: 70 y.o., female  PCP: Kaitlin Perches, MD Referring Physician: Lynnell Catalan, MD  Reason for Consultation: Establishing goals of care and Hospice Evaluation  HPI/Patient Profile: 70 y.o. female  with past medical history of lung cancer with brain mets s/p chemo and radiation treatment, DM, PE who was admitted on 11/03/2020 with nausea, vomiting and abdominal pain.  She was diagnosed with an SBO and underwent ex-lap on 6/9.  On 6/11 patient unfortunately suffered a large MCA/ICA embolic stroke and has remained comatose since.   A repeat CT head was done on 6/12 that showed increased cerebral edema.      Clinical Assessment and Goals of Care: Kaitlin Mustard, PA-S2 and I have reviewed medical records including EPIC notes, labs and imaging, received report from RN, assessed the patient and then met at the bedside along with her youngest daughter and primary care taker Kaitlin [name] to discuss diagnosis prognosis, GOC, EOL wishes, disposition and options.  Kaitlin [name] introduced Palliative Medicine as specialized medical care for people living with serious illness. It focuses on providing relief from the symptoms and stress of a serious illness. The goal is to improve quality of life for both the patient and the family.  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  We discussed a brief life review of the patient and then focused on their current illness. The natural disease trajectory and expectations at EOL were discussed.  [name] is [name].  She was a single mother of 4 and worked hard at multiple jobs to support them.   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  Kaitlin [name] tells [place] about her turnip greens, chitlins and sweet potato pie as family favorites.  On Thanksgiving day 2019  she was Dx'd with cancer and 20 spots were found on her brain.   She did well on chemo and radiation but the cancer returned in 2021.  She had radiation treatment which finished in 07/2020.  She did well.   Kaitlin [name] was her primary care taker at home.  [name] was able to walk and do for herself.    Kaitlin [name] explained that the doctors have filled her in on her mother's condition.  She understands that her mother has suffered a stroke and is not likely to have any functional recovery.   Kaitlin [name] discussed the patient's EEG with Kaitlin [name], to help further understand that the hope of any functional recovery is very minimal.  We talked with Kaitlin [name] about the option of shifting to comfort care and she was agreeable.  She wants very much for her mother to be comfortable and she wants her family to be able to visit.  Kaitlin [name] expressed the desire for her siblings to help her make these decisions but she feels as though they have backed away leaving the burden of decision making to her.  I attempted to elicit values and goals of care important to the patient.  Kaitlin [name] wants her mother to be well cared for and comfortable at the end of her life.   Hospice and Palliative Care services outpatient were explained and offered.  Kaitlin [name] was in agreement with requesting a bed at the [facility]).   I explained that we would keep her mother here until a Hospice bed could be provided for her.  Discussed the importance of continued conversation with family and the medical providers regarding overall plan of care and treatment options, ensuring decisions are within the context of the patient's values and GOCs.    Questions and concerns were addressed. he family was encouraged to call with questions or concerns.  PMT will continue to support holistically.   Primary Decision Maker:  NEXT OF KIN, Kaitlin [name]    SUMMARY OF RECOMMENDATIONS    DNR / Comfort Measures Only. Lift visitation restrictions. TOC order placed for [facility] Bed at [facility]. PMT will continue to follow to help with symptom management until discharge. Chaplain requested for family support.  Code Status/Advance Care Planning: DNR   Symptom Management:  Comfort orders placed.  Additional Recommendations (Limitations, Scope, Preferences): Full Comfort Care  Palliative Prophylaxis:  Delirium Protocol, Frequent Pain Assessment, Oral Care, Palliative Wound Care, and Turn Reposition  Psycho-social/Spiritual:  Desire for further Chaplaincy support:  Prognosis:  Days to less than two weeks.  Discharge Planning: Hospice facility      Primary Diagnoses: Present on Admission:  Pulmonary emboli (HCC)  Protein-calorie malnutrition, severe  Nausea and vomiting  Hypertension  DVT (deep venous thrombosis) (HCC)  Brain metastases (HCC)   I have reviewed the medical record, interviewed the patient and family, and examined the patient. The following aspects are pertinent.  Past Medical History:  Diagnosis Date   Asthma    Cancer (HCC) 04/21/2018   Lung Cancer    Carpal tunnel syndrome    Cataract    left eye   Diabetes mellitus without complication (HCC)    DVT (deep venous thrombosis) (HCC)    Hypertension    Pneumonia 04/21/2018   Pulmonary embolism (HCC) 03/2018   Sepsis (HCC)    Social History   Socioeconomic History   Marital status: Widowed    Spouse name: Not on file   Number of children: 4   Years of education: 12   Highest education level: High school graduate  Occupational History   Not on file  Tobacco Use   Smoking status: Never   Smokeless tobacco: Never  Vaping Use   Vaping Use: Never used  Substance and Sexual Activity   Alcohol use: Not Currently    Comment: occ   Drug use: No   Sexual activity: Yes    Partners: Male    Birth control/protection: Surgical  Other Topics Concern   Not on file  Social History Narrative   The patient is widowed she has 1 son and 3 daughters and 10 grandchildren   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   Social Determinants of Health   Financial Resource Strain: Not on file  Food Insecurity: Not on file  Transportation Needs: Not on file  Physical Activity: Not on file  Stress: Not on file  Social Connections: Not on file   Family History  Problem Relation Age of Onset   Heart  failure Mother    Stroke Mother    Heart attack Mother    Kidney failure Other    Leukemia Father     Allergies  Allergen Reactions   Banana Anaphylaxis   Aspirin Other (See Comments)    G.I. Upset       Vital Signs: BP (!) 146/95   Pulse (!) 101   Temp 99.3 F (37.4 C) (Axillary)   Resp 15   Ht 5\' 7"  (1.702 m)   Wt 88.6 kg   SpO2 97%   BMI 30.59 kg/m  Pain Scale: CPOT POSS *See Group Information*: 1-Acceptable,Awake and alert Pain Score: 0-No pain   SpO2: SpO2: 97 % O2 Device:SpO2: 97 % O2 Flow Rate: .O2 Flow Rate (L/min): (S) 3 L/min (weaned per SpO2 100%)    Palliative Assessment/Data: 10%     Time In: 2:00 Time Out: 3:11 Time Total: 71 min. Visit consisted of counseling and education dealing with the complex and emotionally intense issues surrounding the need for palliative care and symptom management in the setting of serious and potentially life-threatening illness. Greater than 50%  of this time was spent counseling and coordinating care related to the above assessment and plan.  Signed by: Kaitlin Mustard, PA-S2 Norvel Richards, PA-C Palliative Medicine  Please contact Palliative Medicine Team phone at [phone] for questions and concerns.  For individual provider: See Loretha Stapler

## 2020-11-07 NOTE — Progress Notes (Signed)
Nutrition Brief Note  Chart reviewed. Pt now transitioning to comfort care.  No further nutrition interventions planned at this time. RD consult discontinued.  Please re-consult as needed.   Kerman Passey MS, RDN, LDN, CNSC Registered Dietitian III Clinical Nutrition RD Pager and On-Call Pager Number Located in [place]

## 2020-11-07 NOTE — Progress Notes (Signed)
Stroke Neurology Progress Note  INTERVAL HISTORY  Daughters and RN are at the bedside. Pt lying in bed, eyes closed, not open eyes on voice. Still has left sided weakness. On NG tube and 3% saline. Family understood the severity of stroke and agree with palliative care consult and discussion.    MEDICATIONS    Current Facility-Administered Medications (Endocrine & Metabolic):    insulin aspart (novoLOG) injection 0-15 Units   methylPREDNISolone sodium succinate (SOLU-MEDROL) 40 mg/mL injection 40 mg   Current Facility-Administered Medications (Cardiovascular):    hydrALAZINE (APRESOLINE) injection 15 mg   metoprolol tartrate (LOPRESSOR) injection 5 mg   nicardipine (CARDENE) 20mg  in 0.86% saline 221ml IV infusion (0.1 mg/ml)   Current Facility-Administered Medications (Respiratory):    ipratropium-albuterol (DUONEB) 0.5-2.5 (3) MG/3ML nebulizer solution 3 mL   Current Facility-Administered Medications (Analgesics):    acetaminophen (TYLENOL) tablet 650 mg **OR** acetaminophen (TYLENOL) suppository 650 mg   fentaNYL (SUBLIMAZE) injection 25 mcg   fentaNYL (SUBLIMAZE) injection 25-100 mcg   Current Facility-Administered Medications (Hematological):    heparin injection 5,000 Units   Current Facility-Administered Medications (Other):    0.9 %  sodium chloride infusion   chlorhexidine (PERIDEX) 0.12 % solution 15 mL   Chlorhexidine Gluconate Cloth 2 % PADS 6 each   levETIRAcetam (KEPPRA) 2,000 mg in sodium chloride 0.9 % 250 mL IVPB   levETIRAcetam (KEPPRA) IVPB 1000 mg/100 mL premix   MEDLINE mouth rinse   ondansetron (ZOFRAN-ODT) disintegrating tablet 4 mg **OR** ondansetron (ZOFRAN) injection 4 mg   pantoprazole (PROTONIX) injection 40 mg   phenol (CHLORASEPTIC) mouth spray 1 spray   sodium chloride (hypertonic) 3 % solution  No current outpatient medications on file.  VITAL SIGNS  Temp:  [97.9 F (36.6 C)-98.7 F (37.1 C)] 98.2 F (36.8 C) (06/13 0800) Pulse Rate:   [91-114] 108 (06/13 0800) Resp:  [13-24] 18 (06/13 0800) BP: (135-203)/(77-166) 139/87 (06/13 0800) SpO2:  [95 %-100 %] 98 % (06/13 0800)  PHYSICAL EXAM   Temp:  [97.9 F (36.6 C)-98.7 F (37.1 C)] 98.2 F (36.8 C) (06/13 0800) Pulse Rate:  [91-114] 108 (06/13 0800) Resp:  [13-24] 18 (06/13 0800) BP: (135-203)/(77-166) 139/87 (06/13 0800) SpO2:  [95 %-100 %] 98 % (06/13 0800)  General - Well nourished, well developed, eyes closed, not open on voice or pain  Ophthalmologic - fundi not visualized due to noncooperation.  Cardiovascular - Regular rhythm and rate  Neuro - eyes closed, not open on voice or pain. Not answer questions or following commands. Right eye mid position, left eye outward and upward gaze position, not tracking bilaterally, not blinking to visual threat bilaterally. Left facial droop on grimace. Tongue protrusion not cooperative. RUE and RLE mild withdraw to pain. LUE and LLE slightly increased muscle tone and slight withdraw to pain. Sensation, coordination and gait not tested.   IMAGING/DIAGNOSTIC STUDIES   CT head showed 1. No acute intracranial infarct or other abnormality. 2. ASPECTS is 10. 3. Small area of vasogenic edema involving the right parietal lobe, likely related to metastatic disease, also seen on prior brain MRI from 09/30/2020. Overall, appearance is similar to perhaps mildly progressed from prior. Additional punctate calcifications at the left frontal lobe and left cerebellum likely reflect treated disease, stable. 4. Underlying atrophy with chronic small vessel ischemic disease.  CTA head and neck showed occlusion of the right ICA just distal to the bifurcation, new as compared to previous MRI from 09/30/2020, and likely acute in nature. Right ICA remains occluded through  the siphon, with distal reconstitution at the terminus related to collateral flow across the circle-of-Willis. Right MCA perfused without downstream occlusion. 2. Atheromatous  plaque about the left carotid bulb up to 50% stenosis by NASCET criteria. 3. Moderate to severe stenoses at origins of both vertebral arteries, right worse than left. 4. Intracranial atherosclerotic disease including short-segment moderate right A1 and distal left M1 stenoses.  MRI brain showed large acute right MCA territory infarction. No evidence of hemorrhagic transformation or significant mass effect. There are acute infarcts along left parasagittal region in the frontoparietal/ACA territory.   Lab Results  Component Value Date   HGBA1C 6.1 (H) 11/06/2020   Lab Results  Component Value Date   LDLCALC 66 11/06/2020    ASSESSMENT AND PLAN  Kaitlin [name] is a 70 y.o. female right handed who presents with symptomatic right ICA occlusion and large artery right MCA syndrome. She was noted to develop forced left gaze deviation which could not be overcome with head turn associated with rhythmic hippus which subsided. Seizure was suspected and she was loaded with LEV 4,000mg  IV and continued 1,000mg  BID.  Stroke - right MCA large infarct in setting of right ICA occlusion, etiology likely due to hypercoagulable state off lovenox due to recent SBO s/p surgery CT head: CT/CTA - No acute ischemic infarct but some vasogenic edema in the right parietal lobe. CTA brain/neck: Occlusion of the right ICA just distal to the bifurcation CTP neg MRI large acute right MCA territory infarction. No evidence of hemorrhagic transformation or significant mass effect. There are acute infarcts along left parasagittal region in the frontoparietal/ACA territory. TTE EF 70-75% LDL: 66 at goal <70 A1C: 6.1 at goal <7 Therapeutic Lovenox PTA but on hold for SBO surgery, currently on aspirin PR 300 Poor prognosis, recommend palliative care consult. Daughter is in agreement.   ? Seizure  forced left gaze deviation which could not be overcome with head turn associated with rhythmic hippus which subsided. S/p ativan x  1 loaded with LEV 4,000mg  IV and continued 1,000mg  BID. EEG no seizure, cortical dysfunction in right hemisphere likely secondary to underlying structural abnormality (tumor and stroke).  Additionally there is evidence of severe diffuse encephalopathy, nonspecific etiology.  Cerebral edema No significant mass-effect on MRI On 3% saline now, OK to continue at this time Na monitoring, goal 145-150 Further management per palliative care consult  Hypercoagulable state Lung cancer with metastasis to brain, s/p chemotherapy and radiation DVT and PE in 03/2018 On therapeutic Lovenox 150 mg daily but held off due to SBO surgery Now on ASA 300 PR  SBO S/p exploratory laparotomy and partial small bowel resection 6/9 On NG tube with wall suctioning Currently still n.p.o. May consider tube feeding in the next couple days once stable from surgery standpoint  Hypertension Gradually normalize in 5-7 days BP goal < 180/105 Avoid low BP  Hyperlipidemia Home meds Lipitor 20 LDL 66, goal < 70 Resume Lipitor once p.o. access Continue statin at discharge  Diabetes type II HgbA1c 6.2, goal < 7.0 Controlled SSI CBG monitoring  Other Stroke Risk Factors Advanced age Obesity, Body mass index is 30.59 kg/m.  History of DVT/PE  Other Active Problems Lung cancer metastasis to the brain  Hospital day # 4  This patient is critically ill due to large right MCA stroke, cerebral edema, seizure, hypercoagulable state, SBO status post surgery and at significant risk of neurological worsening, death form recurrent stroke, hemorrhagic conversion, status epilepticus, respiratory failure. This patient's care requires  constant monitoring of vital signs, hemodynamics, respiratory and cardiac monitoring, review of multiple databases, neurological assessment, discussion with family, other specialists and medical decision making of high complexity. I spent 45 minutes of neurocritical care time in the care of  this patient. I had long discussion with daughter at bedside, updated pt current condition, treatment plan and potential prognosis, and answered all the questions.  She expressed understanding and appreciation.     Rosalin Hawking, MD PhD Stroke Neurology 11/07/2020 8:05 PM    To contact Stroke Continuity provider, please refer to [email or web address] After hours, contact General Neurology

## 2020-11-07 NOTE — Progress Notes (Signed)
NAME:  Kaitlin [name], MRN:  [id number], DOB:  November 06, 1950, LOS: 4 ADMISSION DATE:  11/03/2020, CONSULTATION DATE:  11/07/2020 REFERRING MD:  TRH, CHIEF COMPLAINT:  stroke with encephalopathy   History of Present Illness:  Kaitlin [name] is a 70 year old woman with history of metastatic lung cancer with mets to the brain who presented with ischemic bowel on 11/03/2020 to [facility].  She additionally has DVT in the setting of malignancy and is on Lovenox.  She was getting radiation therapy to her brain, last in March 2022.  She takes steroids daily for this.  She was taken emergently to the OR on 6/9 small bowel resection.  She was extubated after surgery.  She had some postop ileus but this was resolving.  She had a drop in her hemoglobin so her Lovenox was not immediately resumed postoperatively.  6/11 she had acute onset mental status changes and was evaluated by telestroke and was found to have a right MCA, ICA occlusion.  She was not a candidate for any kind of thrombectomy or revascularization therapy due to her limited life expectancy and metastatic cancer.  She was transferred to [facility] for further management.  This morning she had worsening mental status changes increasing somnolence.  Repeat head CT shows worsening vasogenic cerebral edema.  She was transferred to the ICU for hypertonic saline, mannitol, and evolving stroke.  She is currently not responsive and not following commands.  Pertinent  Medical History  Metastatic lung cancer with mets to the brain, status post radiation therapy to the brain on steroids Recent small bowel obstruction for ischemic bowel Hypertension  Significant Hospital Events: Including procedures, antibiotic start and stop dates in addition to other pertinent events   6/09 admitted to [facility] for small bowel obstruction secondary to ischemic bowel, taken the OR for ex lap and lysis of adhesions and small bowel resection 6/11 stroke alert called, right  internal carotid artery occlusion, transferred to [facility] 6/12 worsening mental status changes and repeat scan shows cerebral edema  Interim History / Subjective:   Remains on cleviprex at 3 mg/hr HTS at 50 ml/hr Afebrile Unmeasured urinary occurrences but has purwick, this morning ~200 ml/hr   Objective   Blood pressure 139/87, pulse (!) 108, temperature 98.2 F (36.8 C), temperature source Axillary, resp. rate 18, height 5\' 7"  (1.702 m), weight 88.6 kg, SpO2 98 %.        Intake/Output Summary (Last 24 hours) at 11/07/2020 1137 Last data filed at 11/07/2020 0900 Gross per 24 hour  Intake 2121.52 ml  Output 3600 ml  Net -1478.48 ml   Filed Weights   11/05/20 2030 11/06/20 0119  Weight: 88.4 kg 88.6 kg    Examination: General:  Chronically ill appearing elderly female lying in bed in NAD HEENT: MM pink/moist, pupils L 4/ R3, disconjugate to left upward gaze at times, anicteric, NGT to ILWS Neuro: will not open eyes, purposeful in RUE but would not follow commands, only would wiggle right toes on command, LUE flaccid, withdrawal to noxious stimuli in LLE CV: NSR, no murmur PULM:  non labored, CTA, on room air GI: soft, hypoBS, midline surgical site wnl, no BM thus far, purwick Extremities: warm/dry, +1 LE edema  Skin: no rashes  Labs/imaging that I havepersonally reviewed  (right click and "Reselect all SmartList Selections" daily)  Na trend 135->137 No BMET CBC- WBC 12.6-> 13.2, otherwise stable  6/13 EEG - suggestive of cortical dysfunction in right hemisphere likely secondary to  underlying structural abnormality (tumor and stroke).  Additionally there is evidence of severe diffuse encephalopathy, nonspecific etiology.  No seizures or definite epileptiform discharges  6/12 MRI braine- large R MCA territory infarction, no mass effect or hemorrhagic transformation, small left parasagittal frontoparietal acute infarcts in the [facility]  Problem list   Small bowel obstruction  Assessment & Plan:  Kaitlin [name] is an unfortunate 70 year old woman with history of metastatic lung cancer to the brain who is now here with acute right internal carotid artery/ MCA occlusion.  Given her hx and recent surgery, she was not a candidate for tPA or intervention.   R ICA/ MCA occlusion- not a candidate for intervention Brain mets  - per Neurology  - will defer HTS and solumedrol to Neurology, MRI ok at this point, is at risk for malignant edema (not on home steroids for vasogenic edema), consider placing PICC if to continue therapies - serial neuro exams  - EEG as above, no evidence of seizures, ongoing Keppra for ppx per Neurology - seizure precautions - remains NPO - SBP goal 140-160 per neuro - cleviprex for SBP goals  - DNI/ DNR - ASA rectal started 6/13, heparin for VTE ppx added 6/12 - remainder of stroke workup per Neurology, TTE pending  Metastatic lung cancer with brain metastases  - DNR/ DNI - PMT consulted 6/13 - outpt oncology f/u  - duonebs prn   DVT/PE in the setting of malignancy, previously on lovenox  - holding lovenox for now   SBO s/p resection 6/9  - per CCS - remains NPO, will need SLP if/ when mental status improves - consider starting TF 6/14 and changing to cortrak 6/15?  GERD - continue PPI  HTN - SBP parameters as above, cleviprex  - consider adding enteral meds when ok with CCS  Hyperglycemia - A1C 6.1 - change to SSI q 4 mod given ongoing CBGs >180  Best practice (right click and "Reselect all SmartList Selections" daily)  Diet:  NPO Pain/Anxiety/Delirium protocol (if indicated): No VAP protocol (if indicated): Not indicated DVT prophylaxis: LMWH GI prophylaxis: N/A Glucose control:  SSI Yes Central venous access:  N/A Arterial line:  N/A Foley:  N/A Mobility:  bed rest  PT consulted: n/a Last date of multidisciplinary goals of care discussion [see above], PMT consulted 6/13 Code  Status:  DNR/ DNI Disposition: ICU  Labs   CBC: Recent Labs  Lab 11/03/20 1448 11/04/20 0607 11/05/20 0451 11/05/20 1357 11/05/20 2233 11/06/20 1340 11/06/20 1853 11/07/20 0803  WBC 9.4   < > 16.5*  --  17.0* 13.3* 12.6* 13.2*  NEUTROABS 7.6  --   --   --   --  11.7* 11.2* 11.8*  HGB 14.6   < > 11.4* 11.3* 11.1* 11.1* 12.4 12.8  HCT 44.5   < > 34.8* 33.8* 32.9* 33.1* 37.0 38.7  MCV 87.1   < > 87.0  --  85.9 84.7 85.1 85.8  PLT 234   < > 188  --  207 201 216 257   < > = values in this interval not displayed.    Basic Metabolic Panel: Recent Labs  Lab 11/04/20 0607 11/05/20 0335 11/05/20 2233 11/06/20 1340 11/06/20 1853 11/07/20 0030 11/07/20 0803  NA 138 135 134* 137 135  135 135 137  K 5.0 4.2 3.9 3.8 3.8  --   --   CL 106 106 104 107 103  --   --   CO2 20* 23 23 24  23  --   --   GLUCOSE 213* 213* 157* 173* 142*  --   --   BUN 17 18 19 20 21   --   --   CREATININE 1.00 0.80 0.78 0.83 0.80  --   --   CALCIUM 8.8* 9.0 9.2 9.2 9.2  --   --   MG 1.8 2.0  --  2.2  --   --   --   PHOS 4.6 1.8*  --  2.3*  --   --   --    GFR: Estimated Creatinine Clearance: 74.8 mL/min (by C-G formula based on SCr of 0.8 mg/dL). Recent Labs  Lab 11/05/20 2233 11/06/20 1340 11/06/20 1853 11/07/20 0803  WBC 17.0* 13.3* 12.6* 13.2*  LATICACIDVEN  --  1.2  --   --     Liver Function Tests: Recent Labs  Lab 11/03/20 1448 11/05/20 2233 11/06/20 1340  AST 18 24 24   ALT 13 19 18   ALKPHOS 58 37* 37*  BILITOT 1.1 0.6 0.7  PROT 7.6 6.2* 5.5*  ALBUMIN 4.4 3.0* 2.6*   Recent Labs  Lab 11/03/20 1448  LIPASE 29   Recent Labs  Lab 11/06/20 1340  AMMONIA 27    ABG    Component Value Date/Time   PHART 7.376 11/03/2020 2355   PCO2ART 33.7 11/03/2020 2355   PO2ART 128 (H) 11/03/2020 2355   HCO3 20.6 11/03/2020 2355   ACIDBASEDEF 5.0 (H) 11/03/2020 2355   O2SAT 98.2 11/03/2020 2355     Coagulation Profile: No results for input(s): INR, PROTIME in the last 168  hours.  Cardiac Enzymes: No results for input(s): CKTOTAL, CKMB, CKMBINDEX, TROPONINI in the last 168 hours.  HbA1C: Hgb A1c MFr Bld  Date/Time Value Ref Range Status  11/06/2020 01:14 AM 6.1 (H) 4.8 - 5.6 % Final    Comment:    (NOTE)         Prediabetes: 5.7 - 6.4         Diabetes: >6.4         Glycemic control for adults with diabetes: <7.0   11/05/2020 03:35 AM 6.0 (H) 4.8 - 5.6 % Final    Comment:    (NOTE)         Prediabetes: 5.7 - 6.4         Diabetes: >6.4         Glycemic control for adults with diabetes: <7.0     CBG: Recent Labs  Lab 11/06/20 0850 11/06/20 1210 11/06/20 1655 11/06/20 2117 11/07/20 0823  GLUCAP 188* 179* 156* 149* 194*    Critical care time: 35 mins       Posey Boyer, ACNP [facility] 11/07/2020, 11:37 AM

## 2020-11-07 NOTE — Progress Notes (Signed)
PT Cancellation Note  Patient Details Name: Kaitlin [name] MRN: [id number] DOB: 1950-11-14   Cancelled Treatment:    Reason Eval/Treat Not Completed: Patient not medically ready  Wyona Almas, PT, DPT Acute Rehabilitation Services Pager [phone] Office [phone]   Deno Etienne 11/07/2020, 8:34 AM

## 2020-11-07 NOTE — Progress Notes (Signed)
Speech Language Pathology Treatment: Dysphagia  Patient Details Name: Kaitlin [name] MRN: [id number] DOB: August 22, 1950 Today's Date: 11/07/2020 Time: 4098-1191 SLP Time Calculation (min) (ACUTE ONLY): 9 min  Assessment / Plan / Recommendation Clinical Impression  Sister-in-law present during session. Lewis was unable to arouse for longer than 3-5 seconds. Oral care provided followed by throat clears with increased secretions and delayed throat clears indicative of some level of sensation. No reflexive swallow noted. PO's not provided at this time. Pt did not follow commands or attempt vocalizations despite prompts. Education given to sister-in-law. ST will continue efforts for po and SLE when appropriated. Continue oral care.     HPI HPI: Kaitlin [name] is a 70 y.o. female who  has a past medical history of Asthma, Cancer (HCC) (04/21/2018), Carpal tunnel syndrome, Cataract, Diabetes mellitus without complication (HCC), DVT (deep venous thrombosis) (HCC), Hypertension, Pneumonia (04/21/2018), Pulmonary embolism (HCC) (03/2018), and Sepsis (HCC).   History of metastatic lung cancer with metastasis to the brain. Code Stroke called 11/06/20 while already hospitalized for abdominal pain and vomiting. Head CT 11/06/20 showed No acute intracranial infarct or other abnormality. Small area of vasogenic edema involving the right parietal lobe,  likely related to metastatic disease, also seen on prior brain MRI  from 09/30/2020. Overall, appearance is similar to perhaps mildly progressed from prior. Additional punctate calcifications at the  left frontal lobe and left cerebellum likely reflect treated  disease, stable.      SLP Plan  Continue with current plan of care       Recommendations  Diet recommendations: NPO Medication Administration: Via alternative means                           Royce Macadamia 11/07/2020, 9:58 AM  Breck Coons Lonell Face.Ed Nurse, children's  [phone] Office [phone]

## 2020-11-07 NOTE — Progress Notes (Signed)
[facility] Progress Note  4 Days Post-Op  Subjective: CC:  Patients daughter and an additional family member are at bedside. Patients eyes are closed. She is not responding to voice or sternal rub for me.  NGT in place with 0 cc in cannister. Per RN and family, no BMs.  Has a history of G-tube placement in the past.  Objective: Vital signs in last 24 hours: Temp:  [97.9 F (36.6 C)-98.7 F (37.1 C)] 98.2 F (36.8 C) (06/13 0800) Pulse Rate:  [91-114] 108 (06/13 0800) Resp:  [13-24] 18 (06/13 0800) BP: (135-203)/(77-166) 139/87 (06/13 0800) SpO2:  [95 %-100 %] 98 % (06/13 0800) Last BM Date:  (uta)  Intake/Output from previous day: 06/12 0701 - 06/13 0700 In: 2121.5 [I.V.:2021.5; IV Piggyback:100] Out: 2800 [Urine:2800] Intake/Output this shift: Total I/O In: -  Out: 800 [Urine:800]  PE: Gen: resting comfortably Card:  Regular rate and rhythm, pedal pulses 2+ BL Pulm:  Normal effort, clear to auscultation bilaterally Abd: Soft, non-tender, non-distended, hypoactive BS, honeycomb removed - incisions C/D/I w/ staples, no cellulitis, no drainage.  NG - 0 cc in cannister Skin: warm and dry, no rashes  Neuro: not responsive, no opening eyes or FC for me    Lab Results:  Recent Labs    11/06/20 1853 11/07/20 0803  WBC 12.6* 13.2*  HGB 12.4 12.8  HCT 37.0 38.7  PLT 216 257   BMET Recent Labs    11/06/20 1340 11/06/20 1853 11/07/20 0030  NA 137 135  135 135  K 3.8 3.8  --   CL 107 103  --   CO2 24 23  --   GLUCOSE 173* 142*  --   BUN 20 21  --   CREATININE 0.83 0.80  --   CALCIUM 9.2 9.2  --    PT/INR No results for input(s): LABPROT, INR in the last 72 hours. CMP     Component Value Date/Time   NA 135 11/07/2020 0030   K 3.8 11/06/2020 1853   CL 103 11/06/2020 1853   CO2 23 11/06/2020 1853   GLUCOSE 142 (H) 11/06/2020 1853   BUN 21 11/06/2020 1853   CREATININE 0.80 11/06/2020 1853   CALCIUM 9.2 11/06/2020 1853   PROT 5.5 (L)  11/06/2020 1340   ALBUMIN 2.6 (L) 11/06/2020 1340   AST 24 11/06/2020 1340   ALT 18 11/06/2020 1340   ALKPHOS 37 (L) 11/06/2020 1340   BILITOT 0.7 11/06/2020 1340   GFRNONAA >60 11/06/2020 1853   GFRAA >60 11/24/2019 0909   Lipase     Component Value Date/Time   LIPASE 29 11/03/2020 1448       Studies/Results: MR BRAIN W WO CONTRAST  Result Date: 11/06/2020 CLINICAL DATA:  Right ICA occlusion, history of metastatic lung cancer EXAM: MRI HEAD WITHOUT AND WITH CONTRAST TECHNIQUE: Multiplanar, multiecho pulse sequences of the brain and surrounding structures were obtained without and with intravenous contrast. CONTRAST:  8.81mL GADAVIST GADOBUTROL 1 MMOL/ML IV SOLN COMPARISON:  MRI 09/30/2020, correlation with recent CT imaging FINDINGS: Motion artifact is present. Brain: There is a large area of restricted diffusion in the right MCA territory involving frontal, parietal, temporal lobes, insula, and basal ganglia. There is some involvement of the lateral right occipital lobe. Additionally, there is minimal involvement of the parasagittal left frontoparietal lobes. No evidence of hemorrhagic transformation. No significant mass effect. There is no mass effect, hydrocephalus, or extra-axial collection. Additional patchy and confluent areas of T2 hyperintensity in the supratentorial white  matter are nonspecific but probably reflects similar chronic microvascular ischemic and/or treatment related changes. Metastatic disease appears improved but is not well evaluated on these large slice thickness postcontrast images with motion artifact. Vascular: Diminished right MCA flow voids. Skull and upper cervical spine: Normal marrow signal is preserved. Sinuses/Orbits: Mild paranasal sinus mucosal thickening. Bilateral lens replacements. Other: Sella is unremarkable.  Bilateral mastoid effusions. IMPRESSION: Large acute right MCA territory infarction. No evidence of hemorrhagic transformation or significant mass  effect. Additional small left parasagittal frontoparietal acute infarcts in the ACA territory. Metastatic disease appears improved but is not well evaluated on this study. These results were called by telephone at the time of interpretation on 11/06/2020 at 5:41 pm to provider Kathlen Mody , who verbally acknowledged these results. Electronically Signed   By: Guadlupe Spanish M.D.   On: 11/06/2020 17:44   ECHOCARDIOGRAM COMPLETE  Result Date: 11/07/2020    ECHOCARDIOGRAM REPORT   Patient Name:   LALLIE LATOURETTE Date of Exam: 11/07/2020 Medical Rec #:  [id number]    Height:       67.0 in Accession #:    [id number]   Weight:       195.3 lb Date of Birth:  [birth year]    BSA:          2.002 m Patient Age:    70 years     BP:           147/89 mmHg Patient Gender: F            HR:           106 bpm. Exam Location:  Inpatient Procedure: 2D Echo, Cardiac Doppler and Color Doppler Indications:    Stroke I63.9  History:        Patient has prior history of Echocardiogram examinations, most                 recent 07/07/2018. Risk Factors:Diabetes and Hypertension.  Sonographer:    Eulah Pont RDCS Referring Phys: [id number] DAVID MANUEL ORTIZ IMPRESSIONS  1. Left ventricular ejection fraction, by estimation, is 70 to 75%. The left ventricle has hyperdynamic function. The left ventricle has no regional wall motion abnormalities. There is moderate asymmetric left ventricular hypertrophy of the basal-septal  segment. Left ventricular diastolic parameters are consistent with Grade I diastolic dysfunction (impaired relaxation).  2. Right ventricular systolic function is normal. The right ventricular size is normal.  3. The mitral valve is normal in structure. Trivial mitral valve regurgitation.  4. The aortic valve is tricuspid. Aortic valve regurgitation is trivial. Mild aortic valve sclerosis is present, with no evidence of aortic valve stenosis. FINDINGS  Left Ventricle: Left ventricular ejection fraction, by estimation, is 70 to 75%.  The left ventricle has hyperdynamic function. The left ventricle has no regional wall motion abnormalities. The left ventricular internal cavity size was small. There is moderate asymmetric left ventricular hypertrophy of the basal-septal segment. Left ventricular diastolic parameters are consistent with Grade I diastolic dysfunction (impaired relaxation). Right Ventricle: The right ventricular size is normal. No increase in right ventricular wall thickness. Right ventricular systolic function is normal. The tricuspid regurgitant velocity is 2.78 m/s, and with an assumed right atrial pressure of 3 mmHg, the estimated right ventricular systolic pressure is 33.9 mmHg. Left Atrium: Left atrial size was normal in size. Right Atrium: Right atrial size was normal in size. Pericardium: Trivial pericardial effusion is present. Mitral Valve: The mitral valve is normal in structure. Trivial mitral valve regurgitation. Tricuspid  Valve: The tricuspid valve is normal in structure. Tricuspid valve regurgitation is trivial. Aortic Valve: The aortic valve is tricuspid. Aortic valve regurgitation is trivial. Aortic regurgitation PHT measures 467 msec. Mild aortic valve sclerosis is present, with no evidence of aortic valve stenosis. Pulmonic Valve: The pulmonic valve was not well visualized. Pulmonic valve regurgitation is not visualized. Aorta: The aortic root and ascending aorta are structurally normal, with no evidence of dilitation. IAS/Shunts: The interatrial septum was not well visualized.  LEFT VENTRICLE PLAX 2D LVIDd:         3.20 cm  Diastology LVIDs:         2.20 cm  LV e' medial:    4.78 cm/s LV PW:         2.05 cm  LV E/e' medial:  15.0 LV IVS:        1.20 cm  LV e' lateral:   7.87 cm/s LVOT diam:     2.00 cm  LV E/e' lateral: 9.1 LV SV:         68 LV SV Index:   34 LVOT Area:     3.14 cm  RIGHT VENTRICLE RV S prime:     17.90 cm/s TAPSE (M-mode): 1.5 cm LEFT ATRIUM           Index       RIGHT ATRIUM          Index LA  diam:      2.10 cm 1.05 cm/m  RA Area:     7.06 cm LA Vol (A2C): 25.0 ml 12.49 ml/m RA Volume:   12.50 ml 6.24 ml/m LA Vol (A4C): 21.3 ml 10.64 ml/m  AORTIC VALVE LVOT Vmax:   94.60 cm/s LVOT Vmean:  71.800 cm/s LVOT VTI:    0.216 m AI PHT:      467 msec  AORTA Ao Root diam: 3.00 cm MITRAL VALVE               TRICUSPID VALVE MV Area (PHT): 2.91 cm    TR Peak grad:   30.9 mmHg MV Decel Time: 261 msec    TR Vmax:        278.00 cm/s MV E velocity: 71.80 cm/s MV A velocity: 94.60 cm/s  SHUNTS MV E/A ratio:  0.76        Systemic VTI:  0.22 m                            Systemic Diam: 2.00 cm Epifanio Lesches MD Electronically signed by Epifanio Lesches MD Signature Date/Time: 11/07/2020/10:14:43 AM    Final    CT HEAD CODE STROKE WO CONTRAST  Result Date: 11/05/2020 CLINICAL DATA:  Code stroke.  Initial evaluation for acute stroke. EXAM: CT HEAD WITHOUT CONTRAST TECHNIQUE: Contiguous axial images were obtained from the base of the skull through the vertex without intravenous contrast. COMPARISON:  Previous MRI from 09/30/2020. FINDINGS: Brain: Generalized cerebral atrophy with chronic small vessel ischemic disease. No acute intracranial hemorrhage. No acute large vessel territory infarct by CT. Few punctate calcifications involving the left frontal region and left cerebellum again seen, likely reflecting treated metastases, also seen on prior CT. Small area of vasogenic edema at the right parietal region also likely related to metastatic disease, also seen on prior MRI, similar to perhaps mildly progressed (series 3, image 29). No other visible new mass or metastatic disease by CT. No significant mass effect or midline shift. No hydrocephalus or extra-axial  fluid collection. Vascular: No hyperdense vessel. Calcified atherosclerosis present at skull base. Skull: Scalp soft tissues demonstrate no acute finding. Calvarium intact. Sinuses/Orbits: Globes and orbital soft tissues demonstrate no acute finding.  Nasogastric tube in place. Sequelae of prior sinus surgery with underlying chronic mucosal thickening. Small bilateral mastoid effusions. Other: None. ASPECTS [name] Stroke Program Early CT Score) - Ganglionic level infarction (caudate, lentiform nuclei, internal capsule, insula, M1-M3 cortex): 7 - Supraganglionic infarction (M4-M6 cortex): 3 Total score (0-10 with 10 being normal): 10 IMPRESSION: 1. No acute intracranial infarct or other abnormality. 2. ASPECTS is 10. 3. Small area of vasogenic edema involving the right parietal lobe, likely related to metastatic disease, also seen on prior brain MRI from 09/30/2020. Overall, appearance is similar to perhaps mildly progressed from prior. Additional punctate calcifications at the left frontal lobe and left cerebellum likely reflect treated disease, stable. 4. Underlying atrophy with chronic small vessel ischemic disease. Critical Value/emergent results were called by telephone at the time of interpretation on 11/05/2020 at 7:35 pm to provider [facility] , who verbally acknowledged these results. Electronically Signed   By: Rise Mu M.D.   On: 11/05/2020 19:47   CT ANGIO HEAD NECK W WO CM W PERF (CODE STROKE)  Result Date: 11/05/2020 CLINICAL DATA:  Initial evaluation for slurred speech, right-sided facial droop. EXAM: CT ANGIOGRAPHY HEAD AND NECK TECHNIQUE: Multidetector CT imaging of the head and neck was performed using the standard protocol during bolus administration of intravenous contrast. Multiplanar CT image reconstructions and MIPs were obtained to evaluate the vascular anatomy. Carotid stenosis measurements (when applicable) are obtained utilizing NASCET criteria, using the distal internal carotid diameter as the denominator. CONTRAST:  OMNIPAQUE IOHEXOL 350 MG/ML SOLN COMPARISON:  Prior head CT from earlier the same day. FINDINGS: CTA NECK FINDINGS Aortic arch: Visualized aortic arch normal in caliber with normal 3 vessel  morphology. No hemodynamically significant stenosis seen about the origin of the great vessels. Right carotid system: Right CCA patent from its origin to the bifurcation without stenosis. Atheromatous change about the right carotid bulb/proximal right ICA. There is occlusion of the right ICA just distal to the bifurcation (series 6, image 133). While this is age indeterminate in appearance by this exam, this appears to be new as compared to previous MRI from 09/30/2020, and is suspected to be acute. Right ICA remains occluded distally to the skull base. Left carotid system: Left CCA patent from its origin to the bifurcation without stenosis. Eccentric calcified plaque about the left carotid bulb with up to approximately 50% stenosis by NASCET criteria. Left ICA patent distally without stenosis, dissection or occlusion. Vertebral arteries: Both vertebral arteries arise from subclavian arteries. No proximal subclavian artery stenosis. Atheromatous plaque at the origins of both vertebral arteries with moderate to severe stenoses bilaterally, right worse than left. Vertebral arteries patent distally within the neck without stenosis, dissection or occlusion. Skeleton: No visible acute osseous finding. No discrete or worrisome osseous lesions. Moderate spondylosis present at C3-4 through C6-7 without high-grade spinal stenosis. Other neck: 9 mm nodule present at the thyroid isthmus, felt to be of doubtful significance given size and patient age, no follow-up imaging recommended (ref: J Am Coll Radiol. 2015 Feb;12(2): 143-50).No other acute soft tissue abnormality within the neck. No other mass or adenopathy. Nasogastric tube in place. Upper chest: Bandlike densities with volume loss visualized upper chest demonstrates no other acute finding. Emanating from the bilateral hilar region/paramediastinal regions again seen, similar to previous PET-CT, and likely reflecting  post treatment changes. Minimal subpleural reticular  densities at the posterior right upper lobe likely reflect post treatment fibrosis and/or scarring, also stable. Mild soft tissue fullness about the bilateral hila without frank adenopathy also similar. Visualized upper chest demonstrates no other acute finding. Review of the MIP images confirms the above findings CTA HEAD FINDINGS Anterior circulation: Petrous left ICA widely patent. Scattered atheromatous plaque within the left carotid siphon with associated mild to moderate multifocal narrowing. Right ICA remains occluded through the siphon, with distal reconstitution at the terminus related to collateral flow across the circle-of-Willis. Left A1 segment widely patent. Short-segment moderate proximal right A1 stenosis (series 10, image 22). Right A1 partially fenestrated distally. Normal anterior communicating artery complex. Both ACAs patent to their distal aspects without stenosis. Right M1 segment widely patent. Focal moderate stenosis at the distal left M1 segment/MCA bifurcation (series 9, image 19). Distal MCA branches well perfused without proximal branch occlusion. Posterior circulation: Atheromatous change within the proximal V4 segments without significant stenosis. Right PICA not well seen. Focal irregularity at the origin of the left PICA of favored to be related to focal vascular tortuosity rather than aneurysm (series 6, image 144). Basilar patent to its distal aspect without stenosis. Superior cerebellar arteries patent bilaterally. Both PCAs primarily supplied via the basilar well perfused to their distal aspects without stenosis. Venous sinuses: Grossly patent allowing for timing the contrast bolus. Anatomic variants: None significant. Review of the MIP images confirms the above findings IMPRESSION: 1. Occlusion of the right ICA just distal to the bifurcation, new as compared to previous MRI from 09/30/2020, and likely acute in nature. Right ICA remains occluded through the siphon, with distal  reconstitution at the terminus related to collateral flow across the circle-of-Willis. Right MCA perfused without downstream occlusion. 2. Atheromatous plaque about the left carotid bulb with up to 50% stenosis by NASCET criteria. 3. Moderate to severe stenoses at the origins of both vertebral arteries, right worse than left. 4. Intracranial atherosclerotic disease including short-segment moderate right A1 and distal left M1 stenoses as above. 5. Stable post treatment changes within the visualized lungs. These results were communicated to Dr. Amada Jupiter at 8:52 pmon 6/11/2022by text page via the [name] messaging system. Electronically Signed   By: Rise Mu M.D.   On: 11/05/2020 21:11    Anti-infectives: Anti-infectives (From admission, onward)    Start     Dose/Rate Route Frequency Ordered Stop   11/04/20 0600  cefoTEtan (CEFOTAN) 2 g in sodium chloride 0.9 % 100 mL IVPB  Status:  Discontinued        2 g 200 mL/hr over 30 Minutes Intravenous On call to O.R. 11/03/20 2312 11/04/20 0851   11/03/20 1715  cefTRIAXone (ROCEPHIN) 1 g in sodium chloride 0.9 % 100 mL IVPB        1 g 200 mL/hr over 30 Minutes Intravenous  Once 11/03/20 1708 11/03/20 1827        Assessment/Plan  Closed-loop small bowel obstruction POD#4 S/p Exploratory laparotomy, partial small bowel resection 6/9 Dr. Lovell Sheehan - approximately 100cm small bowel was resected - afebrile, WBC 13.2 from 12.6, follow - Persistent ileus, continue NPO and NGT to LIWS and await return in bowel function. Will get KUB to confirm NG placement.  - prealbumin is in process - may need TPN vs initiation of trickle tube feeds in the next couple of days. 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

## 2020-11-07 NOTE — Procedures (Addendum)
Patient Name: Kaitlin [name]  MRN: [id number]  Epilepsy Attending: Lora Havens  Referring Physician/Provider: Dr Donnetta Simpers Duration: 11/06/2020 1952 to 11/07/2020 0555  Patient history: 70 year old female with metastatic brain cancer Right MCA infarct with forced left gaze deviation and rhythmic hippus.  EEG to evaluate for seizures.  Level of alertness: Comatose  AEDs during EEG study: Keppra  Technical aspects: This EEG was obtained using a 10 lead EEG system positioned circumferentially without any parasagittal coverage (rapid EEG). Computer selected EEG is reviewed as  well as background features and all clinically significant events.  Description: EEG showed continuous generalized and lateralized right hemisphere 3 to 5 Hz theta-delta slowing. Hyperventilation and photic stimulation were not performed.     ABNORMALITY - Continuous slow, generalized  IMPRESSION: This study is suggestive of cortical dysfunction in right hemisphere likely secondary to underlying structural abnormality (tumor and stroke).  Additionally there is evidence of severe diffuse encephalopathy, nonspecific etiology.  No seizures or definite epileptiform discharges were seen throughout the recording.  Tawanda Schall Barbra Sarks

## 2020-11-08 MED ORDER — HYDROMORPHONE HCL 1 MG/ML IJ SOLN
0.5000 mg | INTRAMUSCULAR | Status: DC | PRN
  Administered 2020-11-08: 0.5 mg via INTRAVENOUS
  Filled 2020-11-08: qty 1

## 2020-11-08 MED ORDER — HYDROMORPHONE HCL 1 MG/ML IJ SOLN
0.5000 mg | Freq: Four times a day (QID) | INTRAMUSCULAR | Status: DC
  Administered 2020-11-08: 0.5 mg via INTRAVENOUS
  Filled 2020-11-08: qty 1

## 2020-11-08 MED ORDER — LEVETIRACETAM IN NACL 1000 MG/100ML IV SOLN: 1000.0000 mg | Freq: Two times a day (BID) | INTRAVENOUS | Status: AC

## 2020-11-08 NOTE — Progress Notes (Signed)
NAME:  Kaitlin [name], MRN:  [id number], DOB:  1950-10-31, LOS: 5 ADMISSION DATE:  11/03/2020, CONSULTATION DATE:  11/08/2020 REFERRING MD:  TRH, CHIEF COMPLAINT:  stroke with encephalopathy   History of Present Illness:  Is called "Kaitlin [name]" by her family/ friends  Kaitlin [name] is a 70 year old woman with history of metastatic lung cancer with mets to the brain who presented with ischemic bowel on 11/03/2020 to [facility].  She additionally has DVT in the setting of malignancy and is on Lovenox.  She was getting radiation therapy to her brain, last in March 2022.  She takes steroids daily for this.  She was taken emergently to the OR on 6/9 small bowel resection.  She was extubated after surgery.  She had some postop ileus but this was resolving.  She had a drop in her hemoglobin so her Lovenox was not immediately resumed postoperatively.  6/11 she had acute onset mental status changes and was evaluated by telestroke and was found to have a right MCA, ICA occlusion.  She was not a candidate for any kind of thrombectomy or revascularization therapy due to her limited life expectancy and metastatic cancer.  She was transferred to [facility] for further management.  This morning she had worsening mental status changes increasing somnolence.  Repeat head CT shows worsening vasogenic cerebral edema.  She was transferred to the ICU for hypertonic saline, mannitol, and evolving stroke.  She is currently not responsive and not following commands.  Pertinent  Medical History  Metastatic lung cancer with mets to the brain, status post radiation therapy to the brain on steroids Recent small bowel obstruction for ischemic bowel Hypertension  Significant Hospital Events: Including procedures, antibiotic start and stop dates in addition to other pertinent events   6/09 admitted to [facility] for small bowel obstruction secondary to ischemic bowel, taken the OR for ex lap and lysis of adhesions and small  bowel resection 6/11 stroke alert called, right internal carotid artery occlusion, transferred to [facility] 6/12 worsening mental status changes and repeat scan shows cerebral edema 6/13 remained on cleviprex and HTS, afebrile, was able to wiggle toes on RLE and purposeful on RUE.  Family decided to transition to full comfort care 6/14 in PM after meeting with palliative care.   Interim History / Subjective:   Pending tx to either palliative floor vs hospice placement Unresponsive  Has been hypertensive  Objective   Blood pressure (!) 152/103, pulse 92, temperature 99.3 F (37.4 C), temperature source Axillary, resp. rate (!) 8, height 5\' 7"  (1.702 m), weight 88.6 kg, SpO2 99 %.        Intake/Output Summary (Last 24 hours) at 11/08/2020 0841 Last data filed at 11/08/2020 0800 Gross per 24 hour  Intake 377.01 ml  Output 1300 ml  Net -922.99 ml   Filed Weights   11/05/20 2030 11/06/20 0119  Weight: 88.4 kg 88.6 kg    Examination: General:  chronically ill elderly female lying in bed in no distress unbothered HEENT: upward gaze Neuro: mostly unresponsive, except moved RUE to light palpation of abd with slight grimacing  CV:  currently NSR, borderline tachycardic PULM:  shallow/ diminished, cheyne-stokes breathing pattern GI: soft, hypobs, obviously tender to slight palpation, midline incision intact Extremities: warm/dry, trace LE edema  Skin: no rashes   No family currently at bedside.   Labs/imaging that I havepersonally reviewed  (right click and "Reselect all SmartList Selections" daily)   Resolved Hospital Problem list  Small bowel obstruction  Assessment & Plan:  Kaitlin [name] is an unfortunate 70 year old woman with history of metastatic lung cancer to the brain who is now here with acute right internal carotid artery/ MCA occlusion.  Given her hx and recent surgery, she was not a candidate for tPA or intervention.  Transitioned to full comfort care  6/13.  R ICA/ MCA occlusion- not a candidate for intervention Possible seizure  Cerebral edema  Brain mets  Metastatic lung cancer with brain metastases  DVT/PE in the setting of malignancy, previously on lovenox  SBO s/p resection 6/9  GERD HTN HLD DMT2 - continue with full comfort care.  Remains a DNR/ DNI.  Greatly appreciate PMT and Neurology input.  - continue scheduled dilaudid 0.25mg  q 6 with prn doses, as well as scheduled ativan 0.25mg  BID with prn doses - continue keppra and decadron for comfort and seizure ppx  -other prn comfort meds including haldol, robinul, tylenol and zofran  - either pending tx to palliative floor vs discharge to hospice    Labs   CBC: Recent Labs  Lab 11/03/20 1448 11/04/20 0607 11/05/20 0451 11/05/20 1357 11/05/20 2233 11/06/20 1340 11/06/20 1853 11/07/20 0803  WBC 9.4   < > 16.5*  --  17.0* 13.3* 12.6* 13.2*  NEUTROABS 7.6  --   --   --   --  11.7* 11.2* 11.8*  HGB 14.6   < > 11.4* 11.3* 11.1* 11.1* 12.4 12.8  HCT 44.5   < > 34.8* 33.8* 32.9* 33.1* 37.0 38.7  MCV 87.1   < > 87.0  --  85.9 84.7 85.1 85.8  PLT 234   < > 188  --  207 201 216 257   < > = values in this interval not displayed.    Basic Metabolic Panel: Recent Labs  Lab 11/04/20 0607 11/05/20 0335 11/05/20 2233 11/06/20 1340 11/06/20 1853 11/07/20 0030 11/07/20 0803  NA 138 135 134* 137 135  135 135 137  K 5.0 4.2 3.9 3.8 3.8  --   --   CL 106 106 104 107 103  --   --   CO2 20* 23 23 24 23   --   --   GLUCOSE 213* 213* 157* 173* 142*  --   --   BUN 17 18 19 20 21   --   --   CREATININE 1.00 0.80 0.78 0.83 0.80  --   --   CALCIUM 8.8* 9.0 9.2 9.2 9.2  --   --   MG 1.8 2.0  --  2.2  --   --   --   PHOS 4.6 1.8*  --  2.3*  --   --   --    GFR: Estimated Creatinine Clearance: 74.8 mL/min (by C-G formula based on SCr of 0.8 mg/dL). Recent Labs  Lab 11/05/20 2233 11/06/20 1340 11/06/20 1853 11/07/20 0803  WBC 17.0* 13.3* 12.6* 13.2*  LATICACIDVEN  --  1.2   --   --     Liver Function Tests: Recent Labs  Lab 11/03/20 1448 11/05/20 2233 11/06/20 1340  AST 18 24 24   ALT 13 19 18   ALKPHOS 58 37* 37*  BILITOT 1.1 0.6 0.7  PROT 7.6 6.2* 5.5*  ALBUMIN 4.4 3.0* 2.6*   Recent Labs  Lab 11/03/20 1448  LIPASE 29   Recent Labs  Lab 11/06/20 1340  AMMONIA 27    ABG    Component Value Date/Time   PHART 7.376 11/03/2020 2355   PCO2ART 33.7  11/03/2020 2355   PO2ART 128 (H) 11/03/2020 2355   HCO3 20.6 11/03/2020 2355   ACIDBASEDEF 5.0 (H) 11/03/2020 2355   O2SAT 98.2 11/03/2020 2355     Coagulation Profile: No results for input(s): INR, PROTIME in the last 168 hours.  Cardiac Enzymes: No results for input(s): CKTOTAL, CKMB, CKMBINDEX, TROPONINI in the last 168 hours.  HbA1C: Hgb A1c MFr Bld  Date/Time Value Ref Range Status  11/06/2020 01:14 AM 6.1 (H) 4.8 - 5.6 % Final    Comment:    (NOTE)         Prediabetes: 5.7 - 6.4         Diabetes: >6.4         Glycemic control for adults with diabetes: <7.0   11/05/2020 03:35 AM 6.0 (H) 4.8 - 5.6 % Final    Comment:    (NOTE)         Prediabetes: 5.7 - 6.4         Diabetes: >6.4         Glycemic control for adults with diabetes: <7.0     CBG: Recent Labs  Lab 11/06/20 1210 11/06/20 1655 11/06/20 2117 11/07/20 0823 11/07/20 1213  GLUCAP 179* 156* 149* 194* 193*        Posey Boyer, ACNP [facility] 11/08/2020, 8:41 AM

## 2020-11-08 NOTE — Progress Notes (Signed)
This chaplain responded to PMT consult for EOL spiritual care.  The chaplain was updated by the Pt. RN-Jen L.  The Pt. appears to be resting comfortably. Family is not present at the Pt. bedside.  The chaplain shared a bedside presence and prayer with the Pt.  The chaplain is available for family spiritual care as needed.  Chaplain Sallyanne Kuster  Pager [phone]

## 2020-11-08 NOTE — TOC Transition Note (Signed)
Transition of Care [facility]) - CM/SW Discharge Note   Patient Details  Name: Kaitlin [name] MRN: [id number] Date of Birth: [birth year]  [facility] CM/SW Contact:  Bethann Berkshire, [place] Phone Number: 11/08/2020, 1:27 PM   Clinical Narrative:     Patient will DC to: [facility] Anticipated DC date: 11/08/20 Family notified:   [name], [name] (Daughter)  [phone] (Mobile)   Transport by: [name]   Per MD patient ready for DC to [facility]. RN, patient, patient's family, and facility notified of DC. Discharge Summary and FL2 sent to facility. RN to call report prior to discharge ([phone]). DC packet on chart. Ambulance transport requested for patient.   CSW will sign off for now as social work intervention is no longer needed. Please consult [place] again if new needs arise.   Final next level of care: [place] Barriers to Discharge: No Barriers Identified   Patient Goals and CMS Choice Patient states their goals for this hospitalization and ongoing recovery are:: comfort CMS Medicare.gov Compare Post Acute Care list provided to:: Patient Represenative (must comment) Choice offered to / list presented to : Adult Children  Discharge Placement              Patient chooses bed at:  [facility]) Patient to be transferred to facility by: [place] Name of family member notified: [name], [name] (Daughter)   [phone] (Mobile) Patient and family notified of of transfer: 11/08/20  Discharge Plan and Services In-house Referral: Clinical Social Work, Hospice / Palliative Care   Post Acute Care Choice: Hospice                               Social Determinants of Health (SDOH) Interventions     Readmission Risk Interventions No flowsheet data found.

## 2020-11-08 NOTE — Progress Notes (Signed)
Daily Progress Note   Patient Name: Kaitlin [name]       Date: 11/08/2020 DOB: 10-Dec-1950  Age: 70 y.o. MRN#: [id number] Attending Physician: Lynnell Catalan, MD Primary Care Physician: [name], MD Admit Date: 11/03/2020  Reason for Consultation/Follow-up: Establishing goals of care and Terminal Care  Subjective: Patient is nonresponsive to my voice or touch. She is moaning intermittently. Respirations are uneven, not labored.  Grandchildren are at bedside.   Review of Systems  Reason unable to perform ROS: dying.   Length of Stay: 5  Current Medications: Scheduled Meds:   chlorhexidine  15 mL Mouth Rinse BID   Chlorhexidine Gluconate Cloth  6 each Topical Daily   dexamethasone (DECADRON) injection  4 mg Intravenous Daily    HYDROmorphone (DILAUDID) injection  0.5 mg Intravenous Q6H   LORazepam  0.25 mg Intravenous BID   mouth rinse  15 mL Mouth Rinse q12n4p    Continuous Infusions:  levETIRAcetam Stopped (11/08/20 0803)    PRN Meds: acetaminophen **OR** acetaminophen, acetaminophen **OR** acetaminophen, antiseptic oral rinse, glycopyrrolate **OR** glycopyrrolate **OR** glycopyrrolate, haloperidol **OR** haloperidol **OR** haloperidol lactate, HYDROmorphone (DILAUDID) injection, LORazepam, ondansetron **OR** ondansetron (ZOFRAN) IV, phenol, polyvinyl alcohol  Physical Exam Vitals and nursing note reviewed.  Cardiovascular:     Comments:  Weak, thready Pulmonary:     Effort: Pulmonary effort is normal.     Comments: irregular Neurological:     Comments: unresponsive            Vital Signs: BP (!) 134/109   Pulse (!) 102   Temp 99.3 F (37.4 C) (Axillary)   Resp 10   Ht 5\' 7"  (1.702 m)   Wt 88.6 kg   SpO2 99%   BMI 30.59 kg/m  SpO2: SpO2: 99 % O2 Device: O2  Device: Room Air O2 Flow Rate: O2 Flow Rate (L/min): (S) 3 L/min (weaned per SpO2 100%)  Intake/output summary:  Intake/Output Summary (Last 24 hours) at 11/08/2020 1127 Last data filed at 11/08/2020 1000 Gross per 24 hour  Intake 401.84 ml  Output 500 ml  Net -98.16 ml   LBM: Last BM Date:  (uta) Baseline Weight: Weight: 88.4 kg Most recent weight: Weight: 88.6 kg       Palliative Assessment/Data: PPS: 10%      Patient Active Problem List   Diagnosis  Date Noted   Acute metabolic encephalopathy 11/06/2020   ICAO (internal carotid artery occlusion), right 11/06/2020   Small bowel obstruction (HCC)    Ischemic necrosis of small bowel (HCC)    Prolapsed internal hemorrhoids, grade 3 04/18/2019   Anal polyp 04/18/2019   Malnutrition of moderate degree 12/03/2018   PEG tube malfunction (HCC) 12/02/2018   History of pulmonary embolism 12/02/2018   Protein-calorie malnutrition, severe 09/12/2018   Metastatic lung cancer (metastasis from lung to other site), left [facility])    Nausea and vomiting 09/11/2018   Weight loss 09/11/2018   Hypokalemia 09/09/2018   Brain metastases (HCC) 07/12/2018   Malignant pericardial effusion (HCC) 07/08/2018   HCAP (healthcare-associated pneumonia) 07/06/2018   Adenocarcinoma of lung, right (HCC) 07/06/2018   Adenocarcinoma, lung, left (HCC) 07/03/2018   Asthma 06/16/2018   Hypertension 06/16/2018   Diabetes mellitus without complication (HCC) 06/16/2018   DVT (deep venous thrombosis) (HCC) 06/16/2018   Pulmonary embolism during treatment with long-term anticoagulation therapy (HCC) 06/16/2018   Lung mass    Pulmonary emboli (HCC) 04/24/2018   Routine screening for STI (sexually transmitted infection) 03/14/2016    Palliative Care Assessment & Plan   Patient Profile: 70 y.o. female  with past medical history of lung cancer with brain mets s/p chemo and radiation treatment, DM, PE who was admitted on 11/03/2020 with nausea, vomiting and abdominal  pain.  She was diagnosed with an SBO and underwent ex-lap on 6/9.  On 6/11 patient unfortunately suffered a large MCA/ICA embolic stroke and has remained comatose since.   A repeat CT head was done on 6/12 that showed increased cerebral edema.        Assessment/Recommendations/Plan  Moaning intermittently- increased dose of scheduled hydromorphone to .5mg  IV ; increased frequency and dose of prn to .5mg  IV q15 min prn I'm uncertain if she will be stable enough to transfer to a hospice facility- will re-eval tomorrow  Goals of Care and Additional Recommendations: Limitations on Scope of Treatment: Full Comfort Care  Code Status: DNR  Prognosis:  Hours - Days  Discharge Planning: Hospice facility vs hospital death  Care plan was discussed with   Thank you for allowing the Palliative Medicine Team to assist in the care of this patient.   Total time: 36 minutes Greater than 50%  of this time was spent counseling and coordinating care related to the above assessment and plan.  Ocie Bob, AGNP-C Palliative Medicine   Please contact Palliative Medicine Team phone at [phone] for questions and concerns.

## 2020-11-08 NOTE — Plan of Care (Signed)
Chart reviewed and noted that pt is in full comfort care measures and pending residential hospice. Neurology will sign off. Please call with questions. Thanks for the consult.  Rosalin Hawking, MD PhD Stroke Neurology 11/08/2020 1:59 PM

## 2020-11-08 NOTE — Discharge Summary (Signed)
Physician Discharge Summary         Patient ID: Kaitlin [name] MRN: [id number] DOB/AGE: 01/27/1951 70 y.o.  Admit date: 11/03/2020 Discharge date: 11/08/2020  Discharge Diagnoses:    Right MCA large infarct in setting of right ICA occlusion Possible seizure Cerebral edema Small bowel obstruction s/p partial small bowel resection 6/9 HTN HLD DMT2 Hypercoagulable state Metastatic lung cancer with brain mets    Discharge summary    70 year old female with prior hx of metastatic lung cancer with mets to the brain, HTN, HLD, and hypercoagulable state with DVT on lovenox originally presenting to [facility] on 6/9 with N/V and abdominal pain found to have a secondary to ischemic bowel, status post partial resection.  She was extubated postoperatively.  Complicated by postop ileus which was improving, however had a drop in her hemoglobin therefore Lovenox was not resumed.  On 6/11, she was noted to have an acute status change, bibasilar by telestroke with imaging consistent with a right ICA and right MCA patient.  He is deemed not a candidate for PA, thrombectomy, or revascularization due to her metastatic cancer and recent surgery.  She was transferred to [facility] for further neurological stroke care 6/11.  Underwent repeat imaging which showed worsening cerebral edema, therefore given mannitol and started on hypertonic saline therapy.  She was placed on cleviprex for strict blood pressure control.  She has been mostly unresponsive and not following commands.  Some concern for seizures given left gaze, therefore loaded and continued on keppra; EEG has been negative thus far.  She has remained a DNR/ DNI per patient and family wishes.  Palliative care consulted given poor overall prognosis with dismal prognosis for any functional recovery.  A family meeting was held on 6/13, in which the family decided to transition to full comfort care.  Medical therapies stopped to focus on pain and comfort.   Patient has since remained unresponsive but hemodynamically to hypertensive at times.  She is to be transferred to [facility].      Discharge Plan by Active Problems    R ICA/ MCA occlusion- not a candidate for intervention Possible seizure Cerebral edema  Brain mets  Metastatic lung cancer with brain metastases DVT/PE in the setting of malignancy, previously on lovenox SBO s/p resection 6/9 GERD HTN HLD DMT2 - transfer to [facility] - DNR/ DNI - comfort meds per Hospice - would continue keppra 1000 mg BID for comfort/ seizure ppx   Significant Hospital tests/ studies    6/9 CT abd/ pelvis >> 1. Closed loop small bowel obstruction in the right pelvis with the obstructed closed loop demonstrating circumferential small bowel wall thickening and poor enhancement, consistent with ischemia. No pneumatosis or portal venous gas. No perforation. 2. Additional small bowel obstruction proximal to the closed loop without signs of ischemia. 3. Aortic Atherosclerosis  6/11 CT head showed 1. No acute intracranial infarct or other abnormality. 2. ASPECTS is 10. 3. Small area of vasogenic edema involving the right parietal lobe, likely related to metastatic disease, also seen on prior brain MRI from 09/30/2020. Overall, appearance is similar to perhaps mildly progressed from prior. Additional punctate calcifications at the left frontal lobe and left cerebellum likely reflect treated disease, stable. 4. Underlying atrophy with chronic small vessel ischemic disease.   6/11 CTA head and neck showed occlusion of the right ICA just distal to the bifurcation, new as compared to previous MRI from 09/30/2020, and likely acute in  in the [place]. Metastatic disease appears improved but is not well evaluated on this study. These results were called by telephone at the time of interpretation on 11/06/2020 at 5:41 pm to provider Hosie Poisson , who verbally acknowledged these results. Electronically Signed   By: Macy Mis M.D.   On: 11/06/2020 17:44   DG Abd Portable 1V  Result Date: 11/07/2020 CLINICAL DATA:  Nasogastric placement EXAM: PORTABLE ABDOMEN - 1 VIEW  COMPARISON:  None. FINDINGS: Nasogastric tube enters the stomach, extends to the mid body and has its tip in the fundus. Dilated loops of small intestine are visible. IMPRESSION: Nasogastric tube enters the stomach with its tip in the fundus. Electronically Signed   By: Nelson Chimes M.D.   On: 11/07/2020 11:22   ECHOCARDIOGRAM COMPLETE  Result Date: 11/07/2020    ECHOCARDIOGRAM REPORT   Patient Name:   Kaitlin [name] Date of Exam: 11/07/2020 Medical Rec #:  [id number]    Height:       67.0 in Accession #:    [id number]   Weight:       195.3 lb Date of Birth:  [birth year]    BSA:          2.002 m Patient Age:    70 years     BP:           147/89 mmHg Patient Gender: F            HR:           106 bpm. Exam Location:  Inpatient Procedure: 2D Echo, Cardiac Doppler and Color Doppler Indications:    Stroke I63.9  History:        Patient has prior history of Echocardiogram examinations, most                 recent 07/07/2018. Risk Factors:Diabetes and Hypertension.  Sonographer:    Bernadene Person RDCS Referring Phys: [id number] [place]  1. Left ventricular ejection fraction, by estimation, is 70 to 75%. The left ventricle has hyperdynamic function. The left ventricle has no regional wall motion abnormalities. There is moderate asymmetric left ventricular hypertrophy of the basal-septal  segment. Left ventricular diastolic parameters are consistent with Grade I diastolic dysfunction (impaired relaxation).  2. Right ventricular systolic function is normal. The right ventricular size is normal.  3. The mitral valve is normal in structure. Trivial mitral valve regurgitation.  4. The aortic valve is tricuspid. Aortic valve regurgitation is trivial. Mild aortic valve sclerosis is present, with no evidence of aortic valve stenosis. FINDINGS  Left Ventricle: Left ventricular ejection fraction, by estimation, is 70 to 75%. The left ventricle has hyperdynamic function. The left ventricle has no regional wall  motion abnormalities. The left ventricular internal cavity size was small. There is moderate asymmetric left ventricular hypertrophy of the basal-septal segment. Left ventricular diastolic parameters are consistent with Grade I diastolic dysfunction (impaired relaxation). Right Ventricle: The right ventricular size is normal. No increase in right ventricular wall thickness. Right ventricular systolic function is normal. The tricuspid regurgitant velocity is 2.78 m/s, and with an assumed right atrial pressure of 3 mmHg, the estimated right ventricular systolic pressure is 06.2 mmHg. Left Atrium: Left atrial size was normal in size. Right Atrium: Right atrial size was normal in size. Pericardium: Trivial pericardial effusion is present. Mitral Valve: The mitral valve is normal in structure. Trivial mitral valve regurgitation. Tricuspid Valve: The tricuspid valve is normal in structure. Tricuspid valve regurgitation is trivial. Aortic  in the [place] territory. Metastatic disease appears improved but is not well evaluated on this study. These results were called by telephone at the time of interpretation on 11/06/2020 at 5:41 pm to provider Hosie Poisson , who verbally acknowledged these results. Electronically Signed   By: Macy Mis M.D.   On: 11/06/2020 17:44   DG Abd Portable 1V  Result Date: 11/07/2020 CLINICAL DATA:  Nasogastric placement EXAM: PORTABLE ABDOMEN - 1 VIEW  COMPARISON:  None. FINDINGS: Nasogastric tube enters the stomach, extends to the mid body and has its tip in the fundus. Dilated loops of small intestine are visible. IMPRESSION: Nasogastric tube enters the stomach with its tip in the fundus. Electronically Signed   By: Nelson Chimes M.D.   On: 11/07/2020 11:22   ECHOCARDIOGRAM COMPLETE  Result Date: 11/07/2020    ECHOCARDIOGRAM REPORT   Patient Name:   Kaitlin [name] Date of Exam: 11/07/2020 Medical Rec #:  [id number]    Height:       67.0 in Accession #:    [id number]   Weight:       195.3 lb Date of Birth:  [birth year]    BSA:          2.002 m Patient Age:    70 years     BP:           147/89 mmHg Patient Gender: F            HR:           106 bpm. Exam Location:  Inpatient Procedure: 2D Echo, Cardiac Doppler and Color Doppler Indications:    Stroke I63.9  History:        Patient has prior history of Echocardiogram examinations, most                 recent 07/07/2018. Risk Factors:Diabetes and Hypertension.  Sonographer:    Bernadene Person RDCS Referring Phys: [id number] [place]  1. Left ventricular ejection fraction, by estimation, is 70 to 75%. The left ventricle has hyperdynamic function. The left ventricle has no regional wall motion abnormalities. There is moderate asymmetric left ventricular hypertrophy of the basal-septal  segment. Left ventricular diastolic parameters are consistent with Grade I diastolic dysfunction (impaired relaxation).  2. Right ventricular systolic function is normal. The right ventricular size is normal.  3. The mitral valve is normal in structure. Trivial mitral valve regurgitation.  4. The aortic valve is tricuspid. Aortic valve regurgitation is trivial. Mild aortic valve sclerosis is present, with no evidence of aortic valve stenosis. FINDINGS  Left Ventricle: Left ventricular ejection fraction, by estimation, is 70 to 75%. The left ventricle has hyperdynamic function. The left ventricle has no regional wall  motion abnormalities. The left ventricular internal cavity size was small. There is moderate asymmetric left ventricular hypertrophy of the basal-septal segment. Left ventricular diastolic parameters are consistent with Grade I diastolic dysfunction (impaired relaxation). Right Ventricle: The right ventricular size is normal. No increase in right ventricular wall thickness. Right ventricular systolic function is normal. The tricuspid regurgitant velocity is 2.78 m/s, and with an assumed right atrial pressure of 3 mmHg, the estimated right ventricular systolic pressure is 06.2 mmHg. Left Atrium: Left atrial size was normal in size. Right Atrium: Right atrial size was normal in size. Pericardium: Trivial pericardial effusion is present. Mitral Valve: The mitral valve is normal in structure. Trivial mitral valve regurgitation. Tricuspid Valve: The tricuspid valve is normal in structure. Tricuspid valve regurgitation is trivial. Aortic  in the [place]. Metastatic disease appears improved but is not well evaluated on this study. These results were called by telephone at the time of interpretation on 11/06/2020 at 5:41 pm to provider Hosie Poisson , who verbally acknowledged these results. Electronically Signed   By: Macy Mis M.D.   On: 11/06/2020 17:44   DG Abd Portable 1V  Result Date: 11/07/2020 CLINICAL DATA:  Nasogastric placement EXAM: PORTABLE ABDOMEN - 1 VIEW  COMPARISON:  None. FINDINGS: Nasogastric tube enters the stomach, extends to the mid body and has its tip in the fundus. Dilated loops of small intestine are visible. IMPRESSION: Nasogastric tube enters the stomach with its tip in the fundus. Electronically Signed   By: Nelson Chimes M.D.   On: 11/07/2020 11:22   ECHOCARDIOGRAM COMPLETE  Result Date: 11/07/2020    ECHOCARDIOGRAM REPORT   Patient Name:   Kaitlin [name] Date of Exam: 11/07/2020 Medical Rec #:  [id number]    Height:       67.0 in Accession #:    [id number]   Weight:       195.3 lb Date of Birth:  [birth year]    BSA:          2.002 m Patient Age:    70 years     BP:           147/89 mmHg Patient Gender: F            HR:           106 bpm. Exam Location:  Inpatient Procedure: 2D Echo, Cardiac Doppler and Color Doppler Indications:    Stroke I63.9  History:        Patient has prior history of Echocardiogram examinations, most                 recent 07/07/2018. Risk Factors:Diabetes and Hypertension.  Sonographer:    Bernadene Person RDCS Referring Phys: [id number] [place]  1. Left ventricular ejection fraction, by estimation, is 70 to 75%. The left ventricle has hyperdynamic function. The left ventricle has no regional wall motion abnormalities. There is moderate asymmetric left ventricular hypertrophy of the basal-septal  segment. Left ventricular diastolic parameters are consistent with Grade I diastolic dysfunction (impaired relaxation).  2. Right ventricular systolic function is normal. The right ventricular size is normal.  3. The mitral valve is normal in structure. Trivial mitral valve regurgitation.  4. The aortic valve is tricuspid. Aortic valve regurgitation is trivial. Mild aortic valve sclerosis is present, with no evidence of aortic valve stenosis. FINDINGS  Left Ventricle: Left ventricular ejection fraction, by estimation, is 70 to 75%. The left ventricle has hyperdynamic function. The left ventricle has no regional wall  motion abnormalities. The left ventricular internal cavity size was small. There is moderate asymmetric left ventricular hypertrophy of the basal-septal segment. Left ventricular diastolic parameters are consistent with Grade I diastolic dysfunction (impaired relaxation). Right Ventricle: The right ventricular size is normal. No increase in right ventricular wall thickness. Right ventricular systolic function is normal. The tricuspid regurgitant velocity is 2.78 m/s, and with an assumed right atrial pressure of 3 mmHg, the estimated right ventricular systolic pressure is 06.2 mmHg. Left Atrium: Left atrial size was normal in size. Right Atrium: Right atrial size was normal in size. Pericardium: Trivial pericardial effusion is present. Mitral Valve: The mitral valve is normal in structure. Trivial mitral valve regurgitation. Tricuspid Valve: The tricuspid valve is normal in structure. Tricuspid valve regurgitation is trivial. Aortic  Physician Discharge Summary         Patient ID: Kaitlin [name] MRN: [id number] DOB/AGE: 01/27/1951 70 y.o.  Admit date: 11/03/2020 Discharge date: 11/08/2020  Discharge Diagnoses:    Right MCA large infarct in setting of right ICA occlusion Possible seizure Cerebral edema Small bowel obstruction s/p partial small bowel resection 6/9 HTN HLD DMT2 Hypercoagulable state Metastatic lung cancer with brain mets    Discharge summary    70 year old female with prior hx of metastatic lung cancer with mets to the brain, HTN, HLD, and hypercoagulable state with DVT on lovenox originally presenting to [facility] on 6/9 with N/V and abdominal pain found to have a secondary to ischemic bowel, status post partial resection.  She was extubated postoperatively.  Complicated by postop ileus which was improving, however had a drop in her hemoglobin therefore Lovenox was not resumed.  On 6/11, she was noted to have an acute status change, bibasilar by telestroke with imaging consistent with a right ICA and right MCA patient.  He is deemed not a candidate for PA, thrombectomy, or revascularization due to her metastatic cancer and recent surgery.  She was transferred to [facility] for further neurological stroke care 6/11.  Underwent repeat imaging which showed worsening cerebral edema, therefore given mannitol and started on hypertonic saline therapy.  She was placed on cleviprex for strict blood pressure control.  She has been mostly unresponsive and not following commands.  Some concern for seizures given left gaze, therefore loaded and continued on keppra; EEG has been negative thus far.  She has remained a DNR/ DNI per patient and family wishes.  Palliative care consulted given poor overall prognosis with dismal prognosis for any functional recovery.  A family meeting was held on 6/13, in which the family decided to transition to full comfort care.  Medical therapies stopped to focus on pain and comfort.   Patient has since remained unresponsive but hemodynamically to hypertensive at times.  She is to be transferred to [facility].      Discharge Plan by Active Problems    R ICA/ MCA occlusion- not a candidate for intervention Possible seizure Cerebral edema  Brain mets  Metastatic lung cancer with brain metastases DVT/PE in the setting of malignancy, previously on lovenox SBO s/p resection 6/9 GERD HTN HLD DMT2 - transfer to [facility] - DNR/ DNI - comfort meds per Hospice - would continue keppra 1000 mg BID for comfort/ seizure ppx   Significant Hospital tests/ studies    6/9 CT abd/ pelvis >> 1. Closed loop small bowel obstruction in the right pelvis with the obstructed closed loop demonstrating circumferential small bowel wall thickening and poor enhancement, consistent with ischemia. No pneumatosis or portal venous gas. No perforation. 2. Additional small bowel obstruction proximal to the closed loop without signs of ischemia. 3. Aortic Atherosclerosis  6/11 CT head showed 1. No acute intracranial infarct or other abnormality. 2. ASPECTS is 10. 3. Small area of vasogenic edema involving the right parietal lobe, likely related to metastatic disease, also seen on prior brain MRI from 09/30/2020. Overall, appearance is similar to perhaps mildly progressed from prior. Additional punctate calcifications at the left frontal lobe and left cerebellum likely reflect treated disease, stable. 4. Underlying atrophy with chronic small vessel ischemic disease.   6/11 CTA head and neck showed occlusion of the right ICA just distal to the bifurcation, new as compared to previous MRI from 09/30/2020, and likely acute in

## 2020-11-08 NOTE — Progress Notes (Signed)
Called report to Santiago Glad at [facility] [phone]).

## 2020-11-08 NOTE — Progress Notes (Signed)
Noted transition to comfort care yesterday afternoon. Please call [facility] [place] [facility] as needed.   Obie Dredge, PA-C [facility] Please see Amion for pager number during day hours 7:00am-4:30pm

## 2020-11-15 ENCOUNTER — Encounter (HOSPITAL_COMMUNITY): Payer: Self-pay | Admitting: Hematology

## 2020-11-24 DIAGNOSIS — I10 Essential (primary) hypertension: Secondary | ICD-10-CM | POA: Diagnosis not present

## 2020-11-24 DIAGNOSIS — J4540 Moderate persistent asthma, uncomplicated: Secondary | ICD-10-CM | POA: Diagnosis not present

## 2020-11-25 DEATH — deceased

## 2020-11-30 ENCOUNTER — Encounter: Payer: Self-pay | Admitting: Radiation Therapy

## 2020-11-30 NOTE — Progress Notes (Signed)
Obituary posted [facility] [place], [place].   Pt expired 2020/12/05

## 2021-01-06 ENCOUNTER — Ambulatory Visit (HOSPITAL_COMMUNITY): Payer: Medicare Other

## 2021-01-11 ENCOUNTER — Ambulatory Visit: Payer: Self-pay | Admitting: Urology
